# Patient Record
Sex: Female | Born: 1965 | Race: White | Hispanic: No | Smoking: Never smoker
Health system: Southern US, Community
[De-identification: ages and names within clinical notes are randomized; demographics above are authoritative.]

## PROBLEM LIST (undated history)

## (undated) DIAGNOSIS — F419 Anxiety disorder, unspecified: Secondary | ICD-10-CM

## (undated) DIAGNOSIS — F329 Major depressive disorder, single episode, unspecified: Secondary | ICD-10-CM

## (undated) DIAGNOSIS — Z803 Family history of malignant neoplasm of breast: Secondary | ICD-10-CM

## (undated) DIAGNOSIS — K8020 Calculus of gallbladder without cholecystitis without obstruction: Secondary | ICD-10-CM

## (undated) DIAGNOSIS — F32A Depression, unspecified: Secondary | ICD-10-CM

## (undated) DIAGNOSIS — M1990 Unspecified osteoarthritis, unspecified site: Secondary | ICD-10-CM

## (undated) DIAGNOSIS — R1319 Other dysphagia: Secondary | ICD-10-CM

## (undated) DIAGNOSIS — G4730 Sleep apnea, unspecified: Secondary | ICD-10-CM

## (undated) DIAGNOSIS — R079 Chest pain, unspecified: Secondary | ICD-10-CM

## (undated) DIAGNOSIS — K5790 Diverticulosis of intestine, part unspecified, without perforation or abscess without bleeding: Secondary | ICD-10-CM

## (undated) DIAGNOSIS — Z90711 Acquired absence of uterus with remaining cervical stump: Secondary | ICD-10-CM

## (undated) DIAGNOSIS — E039 Hypothyroidism, unspecified: Secondary | ICD-10-CM

## (undated) DIAGNOSIS — N898 Other specified noninflammatory disorders of vagina: Secondary | ICD-10-CM

## (undated) DIAGNOSIS — R0609 Other forms of dyspnea: Secondary | ICD-10-CM

## (undated) DIAGNOSIS — Z8541 Personal history of malignant neoplasm of cervix uteri: Secondary | ICD-10-CM

## (undated) DIAGNOSIS — I10 Essential (primary) hypertension: Secondary | ICD-10-CM

## (undated) DIAGNOSIS — K200 Eosinophilic esophagitis: Secondary | ICD-10-CM

## (undated) DIAGNOSIS — K219 Gastro-esophageal reflux disease without esophagitis: Secondary | ICD-10-CM

## (undated) DIAGNOSIS — G3184 Mild cognitive impairment, so stated: Secondary | ICD-10-CM

## (undated) DIAGNOSIS — C801 Malignant (primary) neoplasm, unspecified: Secondary | ICD-10-CM

## (undated) DIAGNOSIS — R1310 Dysphagia, unspecified: Secondary | ICD-10-CM

## (undated) DIAGNOSIS — R002 Palpitations: Secondary | ICD-10-CM

## (undated) HISTORY — DX: Chest pain, unspecified: R07.9

## (undated) HISTORY — DX: Palpitations: R00.2

## (undated) HISTORY — DX: Personal history of malignant neoplasm of cervix uteri: Z85.41

## (undated) HISTORY — DX: Family history of malignant neoplasm of breast: Z80.3

## (undated) HISTORY — DX: Other forms of dyspnea: R06.09

## (undated) HISTORY — DX: Other dysphagia: R13.19

## (undated) HISTORY — PX: ABDOMINAL HYSTERECTOMY: SHX81

## (undated) HISTORY — PX: VAGINAL HYSTERECTOMY: SUR661

## (undated) HISTORY — PX: TUBAL LIGATION: SHX77

## (undated) HISTORY — DX: Eosinophilic esophagitis: K20.0

---
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
## 2005-02-07 ENCOUNTER — Emergency Department: Payer: Self-pay | Admitting: Emergency Medicine

## 2005-02-07 ENCOUNTER — Other Ambulatory Visit: Payer: Self-pay

## 2005-04-19 ENCOUNTER — Emergency Department: Payer: Self-pay | Admitting: Emergency Medicine

## 2005-04-22 ENCOUNTER — Ambulatory Visit: Payer: Self-pay | Admitting: Obstetrics and Gynecology

## 2005-06-07 ENCOUNTER — Emergency Department: Payer: Self-pay | Admitting: Emergency Medicine

## 2005-06-09 ENCOUNTER — Emergency Department: Payer: Self-pay | Admitting: Emergency Medicine

## 2005-06-10 ENCOUNTER — Emergency Department: Payer: Self-pay | Admitting: Emergency Medicine

## 2005-09-22 ENCOUNTER — Emergency Department: Payer: Self-pay | Admitting: Emergency Medicine

## 2005-11-19 ENCOUNTER — Emergency Department: Payer: Self-pay | Admitting: Emergency Medicine

## 2006-03-08 ENCOUNTER — Emergency Department: Payer: Self-pay | Admitting: Emergency Medicine

## 2006-04-05 ENCOUNTER — Emergency Department: Payer: Self-pay

## 2006-07-30 ENCOUNTER — Ambulatory Visit: Payer: Self-pay | Admitting: Obstetrics and Gynecology

## 2006-09-28 ENCOUNTER — Emergency Department: Payer: Self-pay

## 2006-11-04 ENCOUNTER — Emergency Department: Payer: Self-pay

## 2007-01-03 ENCOUNTER — Other Ambulatory Visit: Payer: Self-pay

## 2007-01-03 ENCOUNTER — Emergency Department: Payer: Self-pay | Admitting: Emergency Medicine

## 2007-01-19 ENCOUNTER — Emergency Department: Payer: Self-pay | Admitting: Emergency Medicine

## 2007-06-30 ENCOUNTER — Emergency Department: Payer: Self-pay | Admitting: Emergency Medicine

## 2007-08-03 ENCOUNTER — Ambulatory Visit: Payer: Self-pay | Admitting: Specialist

## 2007-08-08 ENCOUNTER — Emergency Department: Payer: Self-pay | Admitting: Emergency Medicine

## 2007-11-09 ENCOUNTER — Emergency Department: Payer: Self-pay | Admitting: Emergency Medicine

## 2008-07-26 ENCOUNTER — Emergency Department: Payer: Self-pay | Admitting: Emergency Medicine

## 2008-10-01 ENCOUNTER — Emergency Department: Payer: Self-pay

## 2009-01-01 ENCOUNTER — Emergency Department: Payer: Self-pay | Admitting: Emergency Medicine

## 2009-01-03 ENCOUNTER — Ambulatory Visit: Payer: Self-pay | Admitting: Emergency Medicine

## 2009-01-17 ENCOUNTER — Emergency Department: Payer: Self-pay | Admitting: Internal Medicine

## 2009-02-26 ENCOUNTER — Ambulatory Visit: Payer: Self-pay | Admitting: Surgery

## 2009-04-18 ENCOUNTER — Emergency Department: Payer: Self-pay | Admitting: Emergency Medicine

## 2009-07-08 ENCOUNTER — Ambulatory Visit: Payer: Self-pay | Admitting: Family Medicine

## 2009-09-19 ENCOUNTER — Emergency Department: Payer: Self-pay | Admitting: Emergency Medicine

## 2009-12-21 ENCOUNTER — Ambulatory Visit: Payer: Self-pay | Admitting: Internal Medicine

## 2010-03-27 ENCOUNTER — Emergency Department: Payer: Self-pay | Admitting: Emergency Medicine

## 2010-08-03 ENCOUNTER — Emergency Department: Payer: Self-pay | Admitting: Emergency Medicine

## 2010-08-06 ENCOUNTER — Ambulatory Visit: Payer: Self-pay | Admitting: Family Medicine

## 2010-10-13 ENCOUNTER — Ambulatory Visit: Payer: Self-pay | Admitting: Family Medicine

## 2010-10-16 ENCOUNTER — Emergency Department: Payer: Self-pay | Admitting: Emergency Medicine

## 2010-10-17 ENCOUNTER — Emergency Department: Payer: Self-pay | Admitting: Emergency Medicine

## 2010-11-20 ENCOUNTER — Encounter: Payer: Self-pay | Admitting: Family Medicine

## 2010-12-14 ENCOUNTER — Encounter: Payer: Self-pay | Admitting: Family Medicine

## 2011-04-07 ENCOUNTER — Emergency Department: Payer: Self-pay | Admitting: Unknown Physician Specialty

## 2011-04-21 ENCOUNTER — Ambulatory Visit: Payer: Self-pay | Admitting: Urology

## 2011-05-26 ENCOUNTER — Encounter: Payer: Self-pay | Admitting: Podiatry

## 2011-06-16 ENCOUNTER — Encounter: Payer: Self-pay | Admitting: Podiatry

## 2011-06-30 ENCOUNTER — Ambulatory Visit: Payer: Self-pay | Admitting: Podiatry

## 2011-06-30 ENCOUNTER — Ambulatory Visit: Payer: Self-pay | Admitting: Family Medicine

## 2011-08-13 ENCOUNTER — Emergency Department: Payer: Self-pay | Admitting: Emergency Medicine

## 2011-10-26 ENCOUNTER — Emergency Department: Payer: Self-pay | Admitting: Emergency Medicine

## 2011-10-26 LAB — URINALYSIS, COMPLETE
Bilirubin,UR: NEGATIVE
Glucose,UR: NEGATIVE mg/dL (ref 0–75)
Ketone: NEGATIVE
Nitrite: NEGATIVE
Protein: NEGATIVE
RBC,UR: 1 /HPF (ref 0–5)
Specific Gravity: 1.017 (ref 1.003–1.030)
WBC UR: 1 /HPF (ref 0–5)

## 2011-12-04 ENCOUNTER — Ambulatory Visit: Payer: Self-pay | Admitting: Family Medicine

## 2012-02-02 ENCOUNTER — Ambulatory Visit: Payer: Self-pay | Admitting: Family Medicine

## 2012-02-24 ENCOUNTER — Emergency Department: Payer: Self-pay | Admitting: Emergency Medicine

## 2012-02-24 LAB — URINALYSIS, COMPLETE
Bilirubin,UR: NEGATIVE
Blood: NEGATIVE
Glucose,UR: NEGATIVE mg/dL (ref 0–75)
Ketone: NEGATIVE
Nitrite: NEGATIVE
Protein: NEGATIVE
RBC,UR: <1 /HPF (ref 0–5)
WBC UR: 7 /HPF (ref 0–5)

## 2012-02-24 LAB — CBC
HGB: 12.3 g/dL (ref 12.0–16.0)
Platelet: 192 10*3/uL (ref 150–440)
RDW: 13.5 % (ref 11.5–14.5)

## 2012-03-03 DIAGNOSIS — G5750 Tarsal tunnel syndrome, unspecified lower limb: Secondary | ICD-10-CM | POA: Insufficient documentation

## 2012-04-13 ENCOUNTER — Emergency Department: Payer: Self-pay | Admitting: Emergency Medicine

## 2012-04-13 LAB — PROTIME-INR
INR: 0.9
Prothrombin Time: 12.4 secs (ref 11.5–14.7)

## 2012-04-13 LAB — BASIC METABOLIC PANEL
BUN: 11 mg/dL (ref 7–18)
Calcium, Total: 8.9 mg/dL (ref 8.5–10.1)
Co2: 29 mmol/L (ref 21–32)
Creatinine: 0.81 mg/dL (ref 0.60–1.30)
EGFR (Non-African Amer.): >60
Glucose: 88 mg/dL (ref 65–99)
Osmolality: 273 (ref 275–301)
Potassium: 4.1 mmol/L (ref 3.5–5.1)
Sodium: 137 mmol/L (ref 136–145)

## 2012-04-13 LAB — CBC
HCT: 37.6 % (ref 35.0–47.0)
MCH: 29.9 pg (ref 26.0–34.0)
MCV: 85 fL (ref 80–100)
Platelet: 205 10*3/uL (ref 150–440)
RDW: 14 % (ref 11.5–14.5)
WBC: 6.5 10*3/uL (ref 3.6–11.0)

## 2012-04-13 LAB — APTT: Activated PTT: 25.2 secs (ref 23.6–35.9)

## 2012-05-03 ENCOUNTER — Emergency Department: Payer: Self-pay | Admitting: Emergency Medicine

## 2012-05-03 LAB — URINALYSIS, COMPLETE
Bilirubin,UR: NEGATIVE
Blood: NEGATIVE
Glucose,UR: NEGATIVE mg/dL (ref 0–75)
Ketone: NEGATIVE
Protein: NEGATIVE
RBC,UR: <1 /HPF (ref 0–5)
Specific Gravity: 1.008 (ref 1.003–1.030)
Squamous Epithelial: <1
WBC UR: 1 /HPF (ref 0–5)

## 2012-05-05 DIAGNOSIS — G8929 Other chronic pain: Secondary | ICD-10-CM | POA: Insufficient documentation

## 2012-05-05 LAB — URINE CULTURE

## 2012-05-13 DIAGNOSIS — D3000 Benign neoplasm of unspecified kidney: Secondary | ICD-10-CM | POA: Insufficient documentation

## 2012-05-13 DIAGNOSIS — R350 Frequency of micturition: Secondary | ICD-10-CM | POA: Insufficient documentation

## 2012-05-13 DIAGNOSIS — R399 Unspecified symptoms and signs involving the genitourinary system: Secondary | ICD-10-CM | POA: Insufficient documentation

## 2012-05-13 DIAGNOSIS — N3941 Urge incontinence: Secondary | ICD-10-CM | POA: Insufficient documentation

## 2012-05-30 DIAGNOSIS — M659 Synovitis and tenosynovitis, unspecified: Secondary | ICD-10-CM | POA: Insufficient documentation

## 2012-06-13 ENCOUNTER — Emergency Department: Payer: Self-pay | Admitting: Emergency Medicine

## 2012-06-13 LAB — CBC
HGB: 12.7 g/dL (ref 12.0–16.0)
MCHC: 34.5 g/dL (ref 32.0–36.0)
RBC: 4.38 10*6/uL (ref 3.80–5.20)
WBC: 6.5 10*3/uL (ref 3.6–11.0)

## 2012-06-13 LAB — COMPREHENSIVE METABOLIC PANEL
Alkaline Phosphatase: 90 U/L (ref 50–136)
BUN: 12 mg/dL (ref 7–18)
Calcium, Total: 9 mg/dL (ref 8.5–10.1)
Chloride: 99 mmol/L (ref 98–107)
Co2: 31 mmol/L (ref 21–32)
Creatinine: 0.89 mg/dL (ref 0.60–1.30)
EGFR (Non-African Amer.): >60
Glucose: 124 mg/dL — ABNORMAL HIGH (ref 65–99)
SGOT(AST): 22 U/L (ref 15–37)
SGPT (ALT): 33 U/L (ref 12–78)
Sodium: 138 mmol/L (ref 136–145)

## 2012-06-13 LAB — URINALYSIS, COMPLETE
Bilirubin,UR: NEGATIVE
Blood: NEGATIVE
Glucose,UR: NEGATIVE mg/dL (ref 0–75)
Ketone: NEGATIVE
Nitrite: NEGATIVE
Specific Gravity: 1.002 (ref 1.003–1.030)
Squamous Epithelial: 8

## 2012-06-13 LAB — LIPASE, BLOOD: Lipase: 167 U/L (ref 73–393)

## 2012-08-31 ENCOUNTER — Ambulatory Visit: Payer: Self-pay | Admitting: Family Medicine

## 2012-09-21 ENCOUNTER — Ambulatory Visit: Payer: Self-pay | Admitting: Specialist

## 2012-09-21 LAB — POTASSIUM: Potassium: 3.3 mmol/L — ABNORMAL LOW (ref 3.5–5.1)

## 2012-09-28 ENCOUNTER — Ambulatory Visit: Payer: Self-pay | Admitting: Specialist

## 2012-11-02 ENCOUNTER — Ambulatory Visit: Payer: Self-pay | Admitting: Obstetrics and Gynecology

## 2012-11-30 ENCOUNTER — Ambulatory Visit: Payer: Self-pay | Admitting: Obstetrics and Gynecology

## 2012-12-02 ENCOUNTER — Ambulatory Visit: Payer: Self-pay | Admitting: Family Medicine

## 2012-12-11 ENCOUNTER — Emergency Department: Payer: Self-pay | Admitting: Emergency Medicine

## 2012-12-12 DIAGNOSIS — R339 Retention of urine, unspecified: Secondary | ICD-10-CM | POA: Insufficient documentation

## 2012-12-20 ENCOUNTER — Emergency Department: Payer: Self-pay | Admitting: Emergency Medicine

## 2012-12-20 LAB — CBC
HCT: 39 % (ref 35.0–47.0)
HGB: 13.2 g/dL (ref 12.0–16.0)
MCH: 28.5 pg (ref 26.0–34.0)
MCHC: 34 g/dL (ref 32.0–36.0)
MCV: 84 fL (ref 80–100)
Platelet: 232 10*3/uL (ref 150–440)
RDW: 13.9 % (ref 11.5–14.5)
WBC: 7.1 10*3/uL (ref 3.6–11.0)

## 2012-12-20 LAB — TROPONIN I: Troponin-I: <0.02 ng/mL

## 2012-12-20 LAB — CK TOTAL AND CKMB (NOT AT ARMC)
CK, Total: 60 U/L (ref 21–215)
CK-MB: <0.5 ng/mL — ABNORMAL LOW (ref 0.5–3.6)

## 2012-12-20 LAB — COMPREHENSIVE METABOLIC PANEL
Albumin: 3.8 g/dL (ref 3.4–5.0)
BUN: 13 mg/dL (ref 7–18)
Calcium, Total: 8.9 mg/dL (ref 8.5–10.1)
Chloride: 105 mmol/L (ref 98–107)
Co2: 27 mmol/L (ref 21–32)
EGFR (African American): >60
Potassium: 3.8 mmol/L (ref 3.5–5.1)
SGOT(AST): 26 U/L (ref 15–37)
SGPT (ALT): 30 U/L (ref 12–78)

## 2012-12-22 ENCOUNTER — Emergency Department: Payer: Self-pay | Admitting: Emergency Medicine

## 2012-12-22 LAB — COMPREHENSIVE METABOLIC PANEL
Albumin: 3.6 g/dL (ref 3.4–5.0)
Alkaline Phosphatase: 84 U/L (ref 50–136)
Bilirubin,Total: 0.2 mg/dL (ref 0.2–1.0)
Calcium, Total: 9.2 mg/dL (ref 8.5–10.1)
Co2: 29 mmol/L (ref 21–32)
Creatinine: 0.86 mg/dL (ref 0.60–1.30)
Osmolality: 274 (ref 275–301)
Potassium: 4.1 mmol/L (ref 3.5–5.1)
SGOT(AST): 24 U/L (ref 15–37)
SGPT (ALT): 29 U/L (ref 12–78)
Sodium: 137 mmol/L (ref 136–145)

## 2012-12-22 LAB — URINALYSIS, COMPLETE
Bacteria: NONE SEEN
Bilirubin,UR: NEGATIVE
Ketone: NEGATIVE
Nitrite: NEGATIVE
Protein: NEGATIVE
RBC,UR: NONE SEEN /HPF (ref 0–5)
Squamous Epithelial: 13

## 2012-12-22 LAB — CK TOTAL AND CKMB (NOT AT ARMC)
CK, Total: 65 U/L (ref 21–215)
CK-MB: <0.5 ng/mL — ABNORMAL LOW (ref 0.5–3.6)

## 2012-12-22 LAB — CBC
HCT: 37.7 % (ref 35.0–47.0)
HGB: 12.8 g/dL (ref 12.0–16.0)
MCH: 28.4 pg (ref 26.0–34.0)
MCHC: 34 g/dL (ref 32.0–36.0)
MCV: 84 fL (ref 80–100)
Platelet: 232 10*3/uL (ref 150–440)
RDW: 14.3 % (ref 11.5–14.5)
WBC: 7.6 10*3/uL (ref 3.6–11.0)

## 2012-12-22 LAB — PRO B NATRIURETIC PEPTIDE: B-Type Natriuretic Peptide: 209 pg/mL — ABNORMAL HIGH (ref 0–125)

## 2013-04-03 ENCOUNTER — Encounter: Payer: Self-pay | Admitting: Specialist

## 2013-04-14 ENCOUNTER — Encounter: Payer: Self-pay | Admitting: Specialist

## 2013-05-10 ENCOUNTER — Ambulatory Visit: Payer: Self-pay | Admitting: Family Medicine

## 2013-05-15 ENCOUNTER — Encounter: Payer: Self-pay | Admitting: Specialist

## 2013-10-04 ENCOUNTER — Ambulatory Visit: Payer: Self-pay | Admitting: Specialist

## 2014-01-18 ENCOUNTER — Emergency Department: Payer: Self-pay | Admitting: Emergency Medicine

## 2014-01-18 LAB — COMPREHENSIVE METABOLIC PANEL
ANION GAP: 3 — AB (ref 7–16)
AST: 18 U/L (ref 15–37)
Albumin: 3.8 g/dL (ref 3.4–5.0)
Alkaline Phosphatase: 72 U/L
BILIRUBIN TOTAL: 0.3 mg/dL (ref 0.2–1.0)
BUN: 8 mg/dL (ref 7–18)
Calcium, Total: 9.1 mg/dL (ref 8.5–10.1)
Chloride: 105 mmol/L (ref 98–107)
Co2: 30 mmol/L (ref 21–32)
Creatinine: 0.78 mg/dL (ref 0.60–1.30)
EGFR (African American): >60
EGFR (Non-African Amer.): >60
Glucose: 93 mg/dL (ref 65–99)
OSMOLALITY: 274 (ref 275–301)
Potassium: 3.8 mmol/L (ref 3.5–5.1)
SGPT (ALT): 23 U/L (ref 12–78)
Sodium: 138 mmol/L (ref 136–145)
Total Protein: 7.6 g/dL (ref 6.4–8.2)

## 2014-01-18 LAB — CBC WITH DIFFERENTIAL/PLATELET
BASOS PCT: 0.9 %
Basophil #: 0.1 10*3/uL (ref 0.0–0.1)
EOS PCT: 7.4 %
Eosinophil #: 0.5 10*3/uL (ref 0.0–0.7)
HCT: 39.5 % (ref 35.0–47.0)
HGB: 13.5 g/dL (ref 12.0–16.0)
LYMPHS PCT: 37.5 %
Lymphocyte #: 2.5 10*3/uL (ref 1.0–3.6)
MCH: 29.2 pg (ref 26.0–34.0)
MCHC: 34.2 g/dL (ref 32.0–36.0)
MCV: 86 fL (ref 80–100)
MONOS PCT: 5 %
Monocyte #: 0.3 x10 3/mm (ref 0.2–0.9)
NEUTROS ABS: 3.3 10*3/uL (ref 1.4–6.5)
NEUTROS PCT: 49.2 %
Platelet: 211 10*3/uL (ref 150–440)
RBC: 4.62 10*6/uL (ref 3.80–5.20)
RDW: 13.4 % (ref 11.5–14.5)
WBC: 6.7 10*3/uL (ref 3.6–11.0)

## 2014-01-18 LAB — LIPASE, BLOOD: Lipase: 136 U/L (ref 73–393)

## 2014-01-18 LAB — TROPONIN I: Troponin-I: <0.02 ng/mL

## 2014-02-02 DIAGNOSIS — F419 Anxiety disorder, unspecified: Secondary | ICD-10-CM

## 2014-02-02 DIAGNOSIS — F331 Major depressive disorder, recurrent, moderate: Secondary | ICD-10-CM | POA: Insufficient documentation

## 2014-02-02 DIAGNOSIS — F329 Major depressive disorder, single episode, unspecified: Secondary | ICD-10-CM | POA: Insufficient documentation

## 2014-02-02 DIAGNOSIS — N319 Neuromuscular dysfunction of bladder, unspecified: Secondary | ICD-10-CM | POA: Insufficient documentation

## 2014-02-02 DIAGNOSIS — G4733 Obstructive sleep apnea (adult) (pediatric): Secondary | ICD-10-CM | POA: Insufficient documentation

## 2014-02-02 DIAGNOSIS — F32A Depression, unspecified: Secondary | ICD-10-CM | POA: Insufficient documentation

## 2014-02-02 DIAGNOSIS — E039 Hypothyroidism, unspecified: Secondary | ICD-10-CM | POA: Insufficient documentation

## 2014-02-15 ENCOUNTER — Ambulatory Visit: Payer: Self-pay | Admitting: Gastroenterology

## 2014-02-16 LAB — PATHOLOGY REPORT

## 2014-02-22 ENCOUNTER — Emergency Department: Payer: Self-pay | Admitting: Emergency Medicine

## 2014-02-22 LAB — COMPREHENSIVE METABOLIC PANEL
ALBUMIN: 3.5 g/dL (ref 3.4–5.0)
ALK PHOS: 69 U/L
AST: 21 U/L (ref 15–37)
Anion Gap: 6 — ABNORMAL LOW (ref 7–16)
BILIRUBIN TOTAL: 0.3 mg/dL (ref 0.2–1.0)
BUN: 6 mg/dL — AB (ref 7–18)
CO2: 28 mmol/L (ref 21–32)
Calcium, Total: 8.8 mg/dL (ref 8.5–10.1)
Chloride: 104 mmol/L (ref 98–107)
Creatinine: 0.75 mg/dL (ref 0.60–1.30)
EGFR (Non-African Amer.): >60
Glucose: 96 mg/dL (ref 65–99)
Osmolality: 273 (ref 275–301)
Potassium: 3.8 mmol/L (ref 3.5–5.1)
SGPT (ALT): 20 U/L (ref 12–78)
SODIUM: 138 mmol/L (ref 136–145)
TOTAL PROTEIN: 7 g/dL (ref 6.4–8.2)

## 2014-02-22 LAB — CBC
HCT: 37.7 % (ref 35.0–47.0)
HGB: 12.6 g/dL (ref 12.0–16.0)
MCH: 28.7 pg (ref 26.0–34.0)
MCHC: 33.5 g/dL (ref 32.0–36.0)
MCV: 86 fL (ref 80–100)
Platelet: 225 10*3/uL (ref 150–440)
RBC: 4.4 10*6/uL (ref 3.80–5.20)
RDW: 13.3 % (ref 11.5–14.5)
WBC: 5.7 10*3/uL (ref 3.6–11.0)

## 2014-02-22 LAB — LIPASE, BLOOD: LIPASE: 143 U/L (ref 73–393)

## 2014-05-07 ENCOUNTER — Ambulatory Visit: Payer: Self-pay | Admitting: Family Medicine

## 2014-07-16 ENCOUNTER — Encounter: Payer: Self-pay | Admitting: Internal Medicine

## 2014-07-23 DIAGNOSIS — M25561 Pain in right knee: Secondary | ICD-10-CM | POA: Insufficient documentation

## 2014-07-23 DIAGNOSIS — G8929 Other chronic pain: Secondary | ICD-10-CM | POA: Insufficient documentation

## 2014-07-31 ENCOUNTER — Encounter: Payer: Self-pay | Admitting: Internal Medicine

## 2014-08-29 ENCOUNTER — Emergency Department: Payer: Self-pay | Admitting: Student

## 2014-08-29 ENCOUNTER — Emergency Department: Payer: Self-pay | Admitting: Emergency Medicine

## 2014-08-29 LAB — URINALYSIS, COMPLETE
BILIRUBIN, UR: NEGATIVE
BLOOD: NEGATIVE
Bacteria: NONE SEEN
Glucose,UR: NEGATIVE mg/dL (ref 0–75)
Ketone: NEGATIVE
LEUKOCYTE ESTERASE: NEGATIVE
NITRITE: NEGATIVE
Ph: 5 (ref 4.5–8.0)
Protein: NEGATIVE
RBC,UR: <1 /HPF (ref 0–5)
Specific Gravity: 1.019 (ref 1.003–1.030)
Squamous Epithelial: 1
WBC UR: 1 /HPF (ref 0–5)

## 2014-08-29 LAB — COMPREHENSIVE METABOLIC PANEL
ALK PHOS: 82 U/L
ALT: 26 U/L
AST: 25 U/L (ref 15–37)
Albumin: 3.5 g/dL (ref 3.4–5.0)
Anion Gap: 3 — ABNORMAL LOW (ref 7–16)
BUN: 9 mg/dL (ref 7–18)
Bilirubin,Total: 0.4 mg/dL (ref 0.2–1.0)
CALCIUM: 9 mg/dL (ref 8.5–10.1)
Chloride: 103 mmol/L (ref 98–107)
Co2: 30 mmol/L (ref 21–32)
Creatinine: 0.93 mg/dL (ref 0.60–1.30)
EGFR (Non-African Amer.): >60
GLUCOSE: 88 mg/dL (ref 65–99)
Osmolality: 270 (ref 275–301)
Potassium: 3.8 mmol/L (ref 3.5–5.1)
Sodium: 136 mmol/L (ref 136–145)
Total Protein: 7.8 g/dL (ref 6.4–8.2)

## 2014-08-29 LAB — CBC
HCT: 40.1 % (ref 35.0–47.0)
HGB: 13.2 g/dL (ref 12.0–16.0)
MCH: 28.7 pg (ref 26.0–34.0)
MCHC: 32.9 g/dL (ref 32.0–36.0)
MCV: 87 fL (ref 80–100)
Platelet: 249 10*3/uL (ref 150–440)
RBC: 4.6 10*6/uL (ref 3.80–5.20)
RDW: 13.5 % (ref 11.5–14.5)
WBC: 6.3 10*3/uL (ref 3.6–11.0)

## 2014-11-14 ENCOUNTER — Ambulatory Visit: Payer: Self-pay | Admitting: Family Medicine

## 2014-11-29 ENCOUNTER — Ambulatory Visit: Payer: Self-pay | Admitting: Family Medicine

## 2014-12-20 ENCOUNTER — Emergency Department
Admit: 2014-12-20 | Disposition: A | Discharge status: Home / Self Care | Payer: Self-pay | Admitting: Emergency Medicine

## 2014-12-25 ENCOUNTER — Ambulatory Visit
Admit: 2014-12-25 | Disposition: A | Discharge status: Home / Self Care | Payer: Self-pay | Attending: Family Medicine | Admitting: Family Medicine

## 2014-12-27 ENCOUNTER — Emergency Department
Admit: 2014-12-27 | Disposition: A | Discharge status: Home / Self Care | Payer: Self-pay | Admitting: Emergency Medicine

## 2014-12-27 LAB — CBC
HCT: 42.1 % (ref 35.0–47.0)
HGB: 14.1 g/dL (ref 12.0–16.0)
MCH: 28.2 pg (ref 26.0–34.0)
MCHC: 33.5 g/dL (ref 32.0–36.0)
MCV: 84 fL (ref 80–100)
Platelet: 176 10*3/uL (ref 150–440)
RBC: 5.01 10*6/uL (ref 3.80–5.20)
RDW: 14 % (ref 11.5–14.5)
WBC: 5.9 10*3/uL (ref 3.6–11.0)

## 2014-12-27 LAB — COMPREHENSIVE METABOLIC PANEL
ALBUMIN: 4.3 g/dL
ALT: 22 U/L
AST: 20 U/L
Alkaline Phosphatase: 65 U/L
Anion Gap: 3 — ABNORMAL LOW (ref 7–16)
BUN: 11 mg/dL
Bilirubin,Total: 0.4 mg/dL
CALCIUM: 8.8 mg/dL — AB
Chloride: 103 mmol/L
Co2: 29 mmol/L
Creatinine: 0.82 mg/dL
EGFR (African American): >60
EGFR (Non-African Amer.): >60
Glucose: 90 mg/dL
POTASSIUM: 4.3 mmol/L
SODIUM: 135 mmol/L
Total Protein: 7.6 g/dL

## 2014-12-27 LAB — LIPASE, BLOOD: Lipase: 30 U/L

## 2015-01-04 NOTE — Op Note (Signed)
PATIENT NAME:  Kristina Reed, Kristina Reed MR#:  [id number] DATE OF BIRTH:  11-03-1965  DATE OF PROCEDURE:  09/28/2012  PREOPERATIVE DIAGNOSIS:  1.  Tear of the posterior horn of the left medial meniscus.  2.  Extensive synovitis left knee.   POSTOPERATIVE DIAGNOSIS:  1.  Tear of the posterior horn of the left medial meniscus.  2.  Extensive synovitis left knee.   PROCEDURES: 1.  Arthroscopic partial left medial meniscectomy.  2.  Arthroscopic partial synovectomy of the knee.  SURGEON: Park Breed, M.D.   ANESTHESIA: General endotracheal.   COMPLICATIONS: None.   DRAINS: None.   OPERATIVE FINDINGS: The patient had degeneration of the inner border of the medial meniscus posteriorly. This was friable and wavy in nature.  It did not stand up to probing at all. The anterior and posterior cruciates were intact. The lateral compartment was normal. There was extensive synovitis. This was prominent anteriorly and the suprapatellar region. There were no loose bodies and the articular surfaces were normal.   DESCRIPTION OF PROCEDURE: The patient was brought to the Operating Room where she underwent where she underwent satisfactory general endotracheal anesthesia in the supine position. The left knee was prepped and draped in sterile fashion and arthroscopy carried out through standard portals. The above findings were encountered on arthroscopy.  A good deal of time was spent on the anterior synovectomy medially and laterally since this was quite proliferative.  The suprapatellar region was left intact. The medial and lateral gutters were normal. The medial compartment was opened and probed extensively. The posterior meniscus was quite friable and then basket forceps were used to remove the degenerated, friable inner aspect of the meniscus. A shaver was used to smooth this off posteriorly. This brought the meniscus back to its healthy stable tissue.  Once this was completed and adequate  synovectomy was carried  out, the pump pressure was lowered and bleeders were cauterized. The joint was thoroughly irrigated and the stab wounds then closed with 3-0 nylon suture.  0.5% Marcaine with epinephrine and morphine was placed in the joint. Dry sterile dressing was applied. The tourniquet was not used. The patient was awakened and taken to recovery in good condition.    ____________________________ Park Breed, MD hem:ct D: 09/28/2012 08:57:43 ET T: 09/28/2012 10:49:02 ET JOB#: [id number]  cc: Park Breed, MD, <Dictator> Park Breed MD ELECTRONICALLY SIGNED 09/29/2012 13:09

## 2015-01-20 ENCOUNTER — Emergency Department
Admission: EM | Admit: 2015-01-20 | Discharge: 2015-01-20 | Disposition: A | Discharge status: Home / Self Care | Payer: Medicaid Other | Attending: Emergency Medicine | Admitting: Emergency Medicine

## 2015-01-20 ENCOUNTER — Encounter: Payer: Self-pay | Admitting: Emergency Medicine

## 2015-01-20 DIAGNOSIS — N390 Urinary tract infection, site not specified: Secondary | ICD-10-CM | POA: Diagnosis not present

## 2015-01-20 DIAGNOSIS — Z90710 Acquired absence of both cervix and uterus: Secondary | ICD-10-CM | POA: Insufficient documentation

## 2015-01-20 DIAGNOSIS — R112 Nausea with vomiting, unspecified: Secondary | ICD-10-CM | POA: Diagnosis present

## 2015-01-20 HISTORY — DX: Other specified noninflammatory disorders of vagina: N89.8

## 2015-01-20 HISTORY — DX: Malignant (primary) neoplasm, unspecified: C80.1

## 2015-01-20 HISTORY — DX: Calculus of gallbladder without cholecystitis without obstruction: K80.20

## 2015-01-20 HISTORY — DX: Unspecified osteoarthritis, unspecified site: M19.90

## 2015-01-20 LAB — CBC WITH DIFFERENTIAL/PLATELET
Basophils Absolute: 0 10*3/uL (ref 0–0.1)
Basophils Relative: 1 %
Eosinophils Absolute: 0.2 10*3/uL (ref 0–0.7)
Eosinophils Relative: 4 %
HCT: 39.7 % (ref 35.0–47.0)
Hemoglobin: 13.3 g/dL (ref 12.0–16.0)
Lymphocytes Relative: 29 %
Lymphs Abs: 1.9 10*3/uL (ref 1.0–3.6)
MCH: 28.4 pg (ref 26.0–34.0)
MCHC: 33.4 g/dL (ref 32.0–36.0)
MCV: 84.9 fL (ref 80.0–100.0)
MONO ABS: 0.4 10*3/uL (ref 0.2–0.9)
Monocytes Relative: 6 %
NEUTROS PCT: 60 %
Neutro Abs: 3.8 10*3/uL (ref 1.4–6.5)
Platelets: 197 10*3/uL (ref 150–440)
RBC: 4.68 MIL/uL (ref 3.80–5.20)
RDW: 13.7 % (ref 11.5–14.5)
WBC: 6.4 10*3/uL (ref 3.6–11.0)

## 2015-01-20 LAB — URINALYSIS COMPLETE WITH MICROSCOPIC (ARMC ONLY)
Bilirubin Urine: NEGATIVE
Glucose, UA: NEGATIVE mg/dL
HGB URINE DIPSTICK: NEGATIVE
KETONES UR: NEGATIVE mg/dL
NITRITE: NEGATIVE
PROTEIN: 30 mg/dL — AB
SPECIFIC GRAVITY, URINE: 1.023 (ref 1.005–1.030)
pH: 5 (ref 5.0–8.0)

## 2015-01-20 LAB — BASIC METABOLIC PANEL
Anion gap: 7 (ref 5–15)
BUN: 10 mg/dL (ref 6–20)
CALCIUM: 9.4 mg/dL (ref 8.9–10.3)
CO2: 27 mmol/L (ref 22–32)
CREATININE: 0.91 mg/dL (ref 0.44–1.00)
Chloride: 105 mmol/L (ref 101–111)
GFR calc Af Amer: >60 mL/min (ref >60)
Glucose, Bld: 105 mg/dL — ABNORMAL HIGH (ref 65–99)
Potassium: 3.9 mmol/L (ref 3.5–5.1)
SODIUM: 139 mmol/L (ref 135–145)

## 2015-01-20 MED ORDER — NITROFURANTOIN MONOHYD MACRO 100 MG PO CAPS: 100.0000 mg | ORAL_CAPSULE | Freq: Two times a day (BID) | ORAL | Status: AC

## 2015-01-20 MED ORDER — NITROFURANTOIN MACROCRYSTAL 100 MG PO CAPS
ORAL_CAPSULE | ORAL | Status: AC
  Filled 2015-01-20: qty 1

## 2015-01-20 MED ORDER — METOCLOPRAMIDE HCL 10 MG PO TABS: 10.0000 mg | ORAL_TABLET | Freq: Four times a day (QID) | ORAL | Status: DC | PRN

## 2015-01-20 MED ORDER — NITROFURANTOIN MONOHYD MACRO 100 MG PO CAPS
100.0000 mg | ORAL_CAPSULE | Freq: Once | ORAL | Status: AC
  Administered 2015-01-20: 100 mg via ORAL
  Filled 2015-01-20: qty 1

## 2015-01-20 NOTE — ED Provider Notes (Signed)
[facility] Emergency Department Provider Note  ____________________________________________  Time seen: Approximately 1:20 PM  I have reviewed the triage vital signs and the nursing notes.   HISTORY  Chief Complaint Nausea and Emesis    HPI Kristina Reed is a 49 y.o. female with a recent history of nausea and vomiting presents with nausea vomiting for the past 5 days. The patient says that she vomits whenever she eats despite taking Zofran at home. She is not claiming any pain at this time. She says there is no blood in the vomit just appears as the food that she has eaten. She is passing gas. There is no dysuria. She is currently seen at [facility] and is being referred to a GI doctor which she has not seen yet.   Past Medical History  Diagnosis Date  . Thyroid disease   . Arthritis     right knee and right elbow  . Cancer   . Gallstones   . Vaginal inclusion cyst     There are no active problems to display for this patient.   Past Surgical History  Procedure Laterality Date  . Abdominal hysterectomy      partial hysterectomy    No current outpatient prescriptions on file.  Allergies Percocet  Family History  Problem Relation Age of Onset  . Diabetes Mother     Social History History  Substance Use Topics  . Smoking status: Never Smoker   . Smokeless tobacco: Not on file  . Alcohol Use: No    Review of Systems Constitutional: No fever/chills Eyes: No visual changes. ENT: No sore throat. Cardiovascular: Denies chest pain. Respiratory: Denies shortness of breath. Gastrointestinal: No abdominal pain.  No diarrhea.  No constipation. Genitourinary: Negative for dysuria. Musculoskeletal: Negative for back pain. Skin: Negative for rash. Neurological: Negative for headaches, focal weakness or numbness.  10-point ROS otherwise negative.  ____________________________________________   PHYSICAL EXAM:  VITAL  SIGNS: ED Triage Vitals  Enc Vitals Group     BP 01/20/15 1104 130/79 mmHg     Pulse Rate 01/20/15 1104 68     Resp 01/20/15 1104 18     Temp 01/20/15 1104 98.1 F (36.7 C)     Temp Source 01/20/15 1104 Oral     SpO2 01/20/15 1104 99 %     Weight 01/20/15 1104 166 lb (75.297 kg)     Height 01/20/15 1104 5\' 1"  (1.549 m)     Head Cir --      Peak Flow --      Pain Score --      Pain Loc --      Pain Edu? --      Excl. in [place]? --     Constitutional: Alert and oriented. Well appearing and in no acute distress. Eyes: Conjunctivae are normal. PERRL. EOMI. Head: Atraumatic. Nose: No congestion/rhinnorhea. Mouth/Throat: Mucous membranes are moist.  Oropharynx non-erythematous. Neck: No stridor.   Cardiovascular: Normal rate, regular rhythm. Grossly normal heart sounds.  Good peripheral circulation. Respiratory: Normal respiratory effort.  No retractions. Lungs CTAB. Gastrointestinal: Soft and nontender. No distention. No abdominal bruits. No CVA tenderness. Musculoskeletal: No lower extremity tenderness nor edema.  No joint effusions. Neurologic:  Normal speech and language. No gross focal neurologic deficits are appreciated. Speech is normal. No gait instability. Skin:  Skin is warm, dry and intact. No rash noted. Psychiatric: Mood and affect are normal. Speech and behavior are normal.  ____________________________________________   LABS (all labs  ordered are listed, but only abnormal results are displayed)  Labs Reviewed  BASIC METABOLIC PANEL - Abnormal; Notable for the following:    Glucose, Bld 105 (*)    All other components within normal limits  URINALYSIS COMPLETEWITH MICROSCOPIC ([facility])  - Abnormal; Notable for the following:    Color, Urine YELLOW (*)    APPearance CLOUDY (*)    Protein, ur 30 (*)    Leukocytes, UA 3+ (*)    Bacteria, UA RARE (*)    Squamous Epithelial / LPF 6-30 (*)    All other components within normal limits  URINE CULTURE  CBC WITH  DIFFERENTIAL/PLATELET   ____________________________________________  EKG   ____________________________________________  RADIOLOGY   ____________________________________________   PROCEDURES  Procedure(s) performed:   Critical Care performed: No  ____________________________________________   INITIAL IMPRESSION / ASSESSMENT AND PLAN / ED COURSE  Pertinent labs & imaging results that were available during my care of the patient were reviewed by me and considered in my medical decision making (see chart for details).  Patient with UTI but otherwise reassuring lab work and exam. I will give the patient antibiotics for her UTI and we'll try her with Reglan since her Zofran is not working. I offered to the patient that we could try the Reglan and a by mouth challenge before she leaves the emergency department today. However the patient would rather try the new prescription home and follow up with her primary care doctor at [facility]. Plan in place to follow up with GI. ____________________________________________   FINAL CLINICAL IMPRESSION(S) / ED DIAGNOSES  Acute nausea and vomiting. Initial visit.    Doran Stabler, MD 01/20/15 1323

## 2015-01-20 NOTE — ED Notes (Signed)
Pt c/o N/V for the past 5 days.Marland Kitchendenies diarrhea or abd pain

## 2015-01-20 NOTE — ED Notes (Signed)
Pt informed to return if life threatening symptoms occur.   

## 2015-01-22 ENCOUNTER — Emergency Department
Admission: EM | Admit: 2015-01-22 | Discharge: 2015-01-22 | Disposition: A | Discharge status: Home / Self Care | Payer: Medicaid Other | Attending: Emergency Medicine | Admitting: Emergency Medicine

## 2015-01-22 ENCOUNTER — Encounter: Payer: Self-pay | Admitting: Emergency Medicine

## 2015-01-22 DIAGNOSIS — Z79899 Other long term (current) drug therapy: Secondary | ICD-10-CM | POA: Insufficient documentation

## 2015-01-22 DIAGNOSIS — R112 Nausea with vomiting, unspecified: Secondary | ICD-10-CM | POA: Insufficient documentation

## 2015-01-22 LAB — URINALYSIS COMPLETE WITH MICROSCOPIC (ARMC ONLY)
Bilirubin Urine: NEGATIVE
Glucose, UA: NEGATIVE mg/dL
Hgb urine dipstick: NEGATIVE
Nitrite: NEGATIVE
PROTEIN: 30 mg/dL — AB
SPECIFIC GRAVITY, URINE: 1.027 (ref 1.005–1.030)
pH: 5 (ref 5.0–8.0)

## 2015-01-22 LAB — COMPREHENSIVE METABOLIC PANEL
ALT: 30 U/L (ref 14–54)
ANION GAP: 6 (ref 5–15)
AST: 21 U/L (ref 15–41)
Albumin: 4.4 g/dL (ref 3.5–5.0)
Alkaline Phosphatase: 64 U/L (ref 38–126)
BILIRUBIN TOTAL: 0.4 mg/dL (ref 0.3–1.2)
BUN: 11 mg/dL (ref 6–20)
CO2: 29 mmol/L (ref 22–32)
Calcium: 9.3 mg/dL (ref 8.9–10.3)
Chloride: 104 mmol/L (ref 101–111)
Creatinine, Ser: 0.88 mg/dL (ref 0.44–1.00)
GFR calc Af Amer: >60 mL/min (ref >60)
GFR calc non Af Amer: >60 mL/min (ref >60)
GLUCOSE: 87 mg/dL (ref 65–99)
POTASSIUM: 3.9 mmol/L (ref 3.5–5.1)
SODIUM: 139 mmol/L (ref 135–145)
Total Protein: 7.9 g/dL (ref 6.5–8.1)

## 2015-01-22 LAB — CBC WITH DIFFERENTIAL/PLATELET
Basophils Absolute: 0 10*3/uL (ref 0–0.1)
Basophils Relative: 0 %
EOS ABS: 0.2 10*3/uL (ref 0–0.7)
Eosinophils Relative: 4 %
HCT: 41.3 % (ref 35.0–47.0)
HEMOGLOBIN: 13.8 g/dL (ref 12.0–16.0)
LYMPHS ABS: 2.3 10*3/uL (ref 1.0–3.6)
Lymphocytes Relative: 36 %
MCH: 28.2 pg (ref 26.0–34.0)
MCHC: 33.4 g/dL (ref 32.0–36.0)
MCV: 84.6 fL (ref 80.0–100.0)
MONOS PCT: 6 %
Monocytes Absolute: 0.4 10*3/uL (ref 0.2–0.9)
NEUTROS ABS: 3.6 10*3/uL (ref 1.4–6.5)
Neutrophils Relative %: 54 %
Platelets: 189 10*3/uL (ref 150–440)
RBC: 4.88 MIL/uL (ref 3.80–5.20)
RDW: 13.6 % (ref 11.5–14.5)
WBC: 6.5 10*3/uL (ref 3.6–11.0)

## 2015-01-22 MED ORDER — GI COCKTAIL ~~LOC~~
30.0000 mL | Freq: Once | ORAL | Status: AC
  Administered 2015-01-22: 30 mL via ORAL

## 2015-01-22 MED ORDER — LORAZEPAM 0.5 MG PO TABS: 0.5000 mg | ORAL_TABLET | Freq: Three times a day (TID) | ORAL | Status: DC | PRN

## 2015-01-22 MED ORDER — NITROFURANTOIN MONOHYD MACRO 100 MG PO CAPS: 100.0000 mg | ORAL_CAPSULE | Freq: Two times a day (BID) | ORAL | Status: AC

## 2015-01-22 MED ORDER — ONDANSETRON 4 MG PO TBDP
4.0000 mg | ORAL_TABLET | Freq: Once | ORAL | Status: AC
  Administered 2015-01-22: 4 mg via ORAL

## 2015-01-22 MED ORDER — GI COCKTAIL ~~LOC~~
ORAL | Status: AC
  Administered 2015-01-22: 30 mL via ORAL
  Filled 2015-01-22: qty 30

## 2015-01-22 MED ORDER — LORAZEPAM 0.5 MG PO TABS
ORAL_TABLET | ORAL | Status: AC
  Filled 2015-01-22: qty 1

## 2015-01-22 MED ORDER — LORAZEPAM 0.5 MG PO TABS
0.5000 mg | ORAL_TABLET | Freq: Once | ORAL | Status: AC
  Administered 2015-01-22: 0.5 mg via ORAL

## 2015-01-22 MED ORDER — ONDANSETRON 4 MG PO TBDP
ORAL_TABLET | ORAL | Status: AC
  Administered 2015-01-22: 4 mg via ORAL
  Filled 2015-01-22: qty 1

## 2015-01-22 NOTE — ED Notes (Signed)
Pt reports that she was here 5 days ago for the same symptoms, did not go to office but called her PMD and told him that he is not any better, so they instructed her to come here to be re-evaluated.

## 2015-01-22 NOTE — Discharge Instructions (Signed)
Take the ativan as prescribed. Do not drink alcohol, drive or participate in any other potentially dangerous activities while taking this medication as it may make you sleepy. Do not take this medication with any other sedating medications, either prescription or over-the-counter.   Nausea and Vomiting Nausea is a sick feeling that often comes before throwing up (vomiting). Vomiting is a reflex where stomach contents come out of your mouth. Vomiting can cause severe loss of body fluids (dehydration). Children and elderly adults can become dehydrated quickly, especially if they also have diarrhea. Nausea and vomiting are symptoms of a condition or disease. It is important to find the cause of your symptoms. CAUSES   Direct irritation of the stomach lining. This irritation can result from increased acid production (gastroesophageal reflux disease), infection, food poisoning, taking certain medicines (such as nonsteroidal anti-inflammatory drugs), alcohol use, or tobacco use.  Signals from the brain.These signals could be caused by a headache, heat exposure, an inner ear disturbance, increased pressure in the brain from injury, infection, a tumor, or a concussion, pain, emotional stimulus, or metabolic problems.  An obstruction in the gastrointestinal tract (bowel obstruction).  Illnesses such as diabetes, hepatitis, gallbladder problems, appendicitis, kidney problems, cancer, sepsis, atypical symptoms of a heart attack, or eating disorders.  Medical treatments such as chemotherapy and radiation.  Receiving medicine that makes you sleep (general anesthetic) during surgery. DIAGNOSIS Your caregiver may ask for tests to be done if the problems do not improve after a few days. Tests may also be done if symptoms are severe or if the reason for the nausea and vomiting is not clear. Tests may include:  Urine tests.  Blood tests.  Stool tests.  Cultures (to look for evidence of  infection).  X-rays or other imaging studies. Test results can help your caregiver make decisions about treatment or the need for additional tests. TREATMENT You need to stay well hydrated. Drink frequently but in small amounts.You may wish to drink water, sports drinks, clear broth, or eat frozen ice pops or gelatin dessert to help stay hydrated.When you eat, eating slowly may help prevent nausea.There are also some antinausea medicines that may help prevent nausea. HOME CARE INSTRUCTIONS   Take all medicine as directed by your caregiver.  If you do not have an appetite, do not force yourself to eat. However, you must continue to drink fluids.  If you have an appetite, eat a normal diet unless your caregiver tells you differently.  Eat a variety of complex carbohydrates (rice, wheat, potatoes, bread), lean meats, yogurt, fruits, and vegetables.  Avoid high-fat foods because they are more difficult to digest.  Drink enough water and fluids to keep your urine clear or pale yellow.  If you are dehydrated, ask your caregiver for specific rehydration instructions. Signs of dehydration may include:  Severe thirst.  Dry lips and mouth.  Dizziness.  Dark urine.  Decreasing urine frequency and amount.  Confusion.  Rapid breathing or pulse. SEEK IMMEDIATE MEDICAL CARE IF:   You have blood or brown flecks (like coffee grounds) in your vomit.  You have black or bloody stools.  You have a severe headache or stiff neck.  You are confused.  You have severe abdominal pain.  You have chest pain or trouble breathing.  You do not urinate at least once every 8 hours.  You develop cold or clammy skin.  You continue to vomit for longer than 24 to 48 hours.  You have a fever. MAKE SURE YOU:  Understand these instructions.  Will watch your condition.  Will get help right away if you are not doing well or get worse. Document Released: 08/31/2005 Document Revised: 11/23/2011  Document Reviewed: 01/28/2011 [facility] Patient Information 2015 [place], [place]. This information is not intended to replace advice given to you by your health care provider. Make sure you discuss any questions you have with your health care provider.

## 2015-01-22 NOTE — ED Provider Notes (Signed)
[facility] Emergency Department Provider Note    ____________________________________________  Time seen: 1515  I have reviewed the triage vital signs and the nursing notes.   HISTORY  Chief Complaint Emesis   History limited by: Not Limited   HPI Kristina Reed is a 49 y.o. female who presents to the emergency department today because of continued nausea and vomiting whilst eating. The patient states the symptoms have begun on for at least a week. She was seen in the emergency department couple of days for further same symptoms. Diagnosed with a UTI and given Phenergan suppositories and Macrobid. She states suppositories haven't worked and she has not been able to keep her antibiotics down. She denies ever having any associated abdominal pain. Denies any fevers. Denies any chest pain or shortness breath.     Past Medical History  Diagnosis Date  . Thyroid disease   . Arthritis     right knee and right elbow  . Cancer   . Gallstones   . Vaginal inclusion cyst     There are no active problems to display for this patient.   Past Surgical History  Procedure Laterality Date  . Abdominal hysterectomy      partial hysterectomy    Current Outpatient Rx  Name  Route  Sig  Dispense  Refill  . metoCLOPramide (REGLAN) 10 MG tablet   Oral   Take 1 tablet (10 mg total) by mouth every 6 (six) hours as needed for nausea or vomiting.   12 tablet   1   . nitrofurantoin, macrocrystal-monohydrate, (MACROBID) 100 MG capsule   Oral   Take 1 capsule (100 mg total) by mouth 2 (two) times daily.   14 capsule   0     Allergies Percocet  Family History  Problem Relation Age of Onset  . Diabetes Mother     Social History History  Substance Use Topics  . Smoking status: Never Smoker   . Smokeless tobacco: Not on file  . Alcohol Use: No    Review of Systems  Constitutional: Negative for fever. Cardiovascular: Negative for chest  pain. Respiratory: Negative for shortness of breath. Gastrointestinal: Negative for abdominal pain, positive for nausea and vomiting Genitourinary: Culture with urination Musculoskeletal: Negative for back pain. Skin: Negative for rash. Neurological: Negative for headaches, focal weakness or numbness.   10-point ROS otherwise negative.  ____________________________________________   PHYSICAL EXAM:  VITAL SIGNS: ED Triage Vitals  Enc Vitals Group     BP 01/22/15 1119 122/85 mmHg     Pulse Rate 01/22/15 1119 69     Resp 01/22/15 1119 18     Temp 01/22/15 1119 97.5 F (36.4 C)     Temp Source 01/22/15 1119 Oral     SpO2 01/22/15 1119 100 %     Weight 01/22/15 1119 166 lb (75.297 kg)     Height 01/22/15 1119 5\' 1"  (1.549 m)   Constitutional: Alert and oriented. Well appearing and in no distress. Eyes: Conjunctivae are normal. PERRL. Normal extraocular movements. ENT   Head: Normocephalic and atraumatic.   Nose: No congestion/rhinnorhea.   Mouth/Throat: Mucous membranes are moist.   Neck: No stridor. Hematological/Lymphatic/Immunilogical: No cervical lymphadenopathy. Cardiovascular: Normal rate, regular rhythm.  No murmurs, rubs, or gallops. Respiratory: Normal respiratory effort without tachypnea nor retractions. Breath sounds are clear and equal bilaterally. No wheezes/rales/rhonchi. Gastrointestinal: Soft and nontender. No distention. There is no CVA tenderness. Genitourinary: Deferred Musculoskeletal: Normal range of motion in all extremities. No joint  effusions.  No lower extremity tenderness nor edema. Neurologic:  Normal speech and language. No gross focal neurologic deficits are appreciated. Speech is normal.  Skin:  Skin is warm, dry and intact. No rash noted. Psychiatric: Mood and affect are normal. Speech and behavior are normal. Patient exhibits appropriate insight and judgment.  ____________________________________________    LABS (pertinent  positives/negatives)  Labs Reviewed  CBC WITH DIFFERENTIAL/PLATELET  COMPREHENSIVE METABOLIC PANEL     ____________________________________________   EKG  None  ____________________________________________    RADIOLOGY  None  ____________________________________________   PROCEDURES  Procedure(s) performed: None  Critical Care performed: No  ____________________________________________   INITIAL IMPRESSION / ASSESSMENT AND PLAN / ED COURSE  Pertinent labs & imaging results that were available during my care of the patient were reviewed by me and considered in my medical decision making (see chart for details).  Patient here with continued nausea and vomiting. On exam patient does not have any abdominal tenderness. Lab work is reassuring. We will try GI cocktail.  ----------------------------------------- 6:43 PM on 01/22/2015 -----------------------------------------  Patient is much improved after half a milligram of Ativan. Patient able to eat half a [place] sandwich without problems. Plan to discharge patient home with course of Ativan. Did encourage patient to continue to follow-up as scheduled.  Unfortunately urine has been delayed. Family needs to leave the emergency department to travel safely home. Will give patient more prescription for Macrobid given the patient has had unclear ability to keep pills down in the past couple of days.  In my judgment, in view of the above findings, the patient has a reassuring evaluation and can be safely discharged.Issues concerning treatment and diagnosis were discussed. There were no barriers to understanding. The plan of treatment explanation was well received by the patient and/or family who then verbalized understanding.  ____________________________________________   FINAL CLINICAL IMPRESSION(S) / ED DIAGNOSES  Final diagnoses:  Nausea and vomiting, vomiting of unspecified type     Nance Pear,  MD 01/22/15 1844

## 2015-01-22 NOTE — ED Notes (Signed)
Patient given [place] sandwich tray and eating well.  No obvious distress, no nausea.  Will continue to monitor.

## 2015-02-01 ENCOUNTER — Other Ambulatory Visit: Payer: Self-pay | Admitting: Gastroenterology

## 2015-02-01 DIAGNOSIS — R112 Nausea with vomiting, unspecified: Secondary | ICD-10-CM

## 2015-02-07 ENCOUNTER — Ambulatory Visit
Admission: RE | Admit: 2015-02-07 | Discharge: 2015-02-07 | Disposition: A | Discharge status: Home / Self Care | Payer: Medicaid Other | Source: Ambulatory Visit | Attending: Gastroenterology | Admitting: Gastroenterology

## 2015-02-07 DIAGNOSIS — M4807 Spinal stenosis, lumbosacral region: Secondary | ICD-10-CM | POA: Insufficient documentation

## 2015-02-07 DIAGNOSIS — M4806 Spinal stenosis, lumbar region: Secondary | ICD-10-CM | POA: Diagnosis not present

## 2015-02-07 DIAGNOSIS — R112 Nausea with vomiting, unspecified: Secondary | ICD-10-CM | POA: Diagnosis present

## 2015-02-07 DIAGNOSIS — D1771 Benign lipomatous neoplasm of kidney: Secondary | ICD-10-CM | POA: Diagnosis not present

## 2015-02-07 HISTORY — DX: Acquired absence of uterus with remaining cervical stump: Z90.711

## 2015-02-07 MED ORDER — IOHEXOL 350 MG/ML SOLN
100.0000 mL | Freq: Once | INTRAVENOUS | Status: AC | PRN
  Administered 2015-02-07: 100 mL via INTRAVENOUS

## 2015-02-13 ENCOUNTER — Ambulatory Visit
Admission: RE | Admit: 2015-02-13 | Discharge: 2015-02-13 | Disposition: A | Discharge status: Home / Self Care | Payer: Medicaid Other | Source: Ambulatory Visit | Attending: Gastroenterology | Admitting: Gastroenterology

## 2015-02-13 DIAGNOSIS — R112 Nausea with vomiting, unspecified: Secondary | ICD-10-CM | POA: Insufficient documentation

## 2015-02-13 MED ORDER — TECHNETIUM TC 99M SULFUR COLLOID
2.0000 | Freq: Once | INTRAVENOUS | Status: AC | PRN
  Administered 2015-02-13: 2.1 via INTRAVENOUS

## 2015-02-17 ENCOUNTER — Encounter: Payer: Self-pay | Admitting: Emergency Medicine

## 2015-02-17 ENCOUNTER — Emergency Department
Admission: EM | Admit: 2015-02-17 | Discharge: 2015-02-17 | Disposition: A | Discharge status: Home / Self Care | Payer: Medicaid Other | Attending: Emergency Medicine | Admitting: Emergency Medicine

## 2015-02-17 DIAGNOSIS — Z8719 Personal history of other diseases of the digestive system: Secondary | ICD-10-CM | POA: Diagnosis not present

## 2015-02-17 DIAGNOSIS — R112 Nausea with vomiting, unspecified: Secondary | ICD-10-CM | POA: Diagnosis not present

## 2015-02-17 HISTORY — DX: Gastro-esophageal reflux disease without esophagitis: K21.9

## 2015-02-17 LAB — COMPREHENSIVE METABOLIC PANEL
ALBUMIN: 4.1 g/dL (ref 3.5–5.0)
ALT: 16 U/L (ref 14–54)
AST: 21 U/L (ref 15–41)
Alkaline Phosphatase: 60 U/L (ref 38–126)
Anion gap: 7 (ref 5–15)
BUN: 10 mg/dL (ref 6–20)
CHLORIDE: 105 mmol/L (ref 101–111)
CO2: 25 mmol/L (ref 22–32)
CREATININE: 1 mg/dL (ref 0.44–1.00)
Calcium: 9.3 mg/dL (ref 8.9–10.3)
GFR calc Af Amer: >60 mL/min (ref >60)
Glucose, Bld: 100 mg/dL — ABNORMAL HIGH (ref 65–99)
Potassium: 3.7 mmol/L (ref 3.5–5.1)
Sodium: 137 mmol/L (ref 135–145)
TOTAL PROTEIN: 7.6 g/dL (ref 6.5–8.1)
Total Bilirubin: 0.3 mg/dL (ref 0.3–1.2)

## 2015-02-17 LAB — CBC WITH DIFFERENTIAL/PLATELET
BASOS ABS: 0 10*3/uL (ref 0–0.1)
Basophils Relative: 1 %
Eosinophils Absolute: 0.2 10*3/uL (ref 0–0.7)
Eosinophils Relative: 3 %
HEMATOCRIT: 39 % (ref 35.0–47.0)
Hemoglobin: 13.3 g/dL (ref 12.0–16.0)
Lymphocytes Relative: 37 %
Lymphs Abs: 2.5 10*3/uL (ref 1.0–3.6)
MCH: 28.4 pg (ref 26.0–34.0)
MCHC: 34.1 g/dL (ref 32.0–36.0)
MCV: 83.3 fL (ref 80.0–100.0)
MONO ABS: 0.4 10*3/uL (ref 0.2–0.9)
MONOS PCT: 7 %
Neutro Abs: 3.5 10*3/uL (ref 1.4–6.5)
Neutrophils Relative %: 52 %
Platelets: 177 10*3/uL (ref 150–440)
RBC: 4.68 MIL/uL (ref 3.80–5.20)
RDW: 13.7 % (ref 11.5–14.5)
WBC: 6.7 10*3/uL (ref 3.6–11.0)

## 2015-02-17 LAB — LIPASE, BLOOD: Lipase: 30 U/L (ref 22–51)

## 2015-02-17 LAB — TROPONIN I: Troponin I: <0.03 ng/mL (ref <0.031)

## 2015-02-17 MED ORDER — METOCLOPRAMIDE HCL 5 MG/ML IJ SOLN
20.0000 mg | Freq: Once | INTRAVENOUS | Status: AC
  Administered 2015-02-17: 20 mg via INTRAVENOUS
  Filled 2015-02-17: qty 4

## 2015-02-17 MED ORDER — SODIUM CHLORIDE 0.9 % IV BOLUS (SEPSIS)
1000.0000 mL | Freq: Once | INTRAVENOUS | Status: AC
  Administered 2015-02-17: 1000 mL via INTRAVENOUS

## 2015-02-17 MED ORDER — METOCLOPRAMIDE HCL 10 MG PO TABS: 10.0000 mg | ORAL_TABLET | Freq: Three times a day (TID) | ORAL | Status: DC

## 2015-02-17 NOTE — Discharge Instructions (Signed)

## 2015-02-17 NOTE — ED Notes (Signed)
AAOx3.  Skin warm and dry.  Ambulates well and independently.  Understnading of discharge instructions verbalized.

## 2015-02-17 NOTE — ED Notes (Signed)
Pt with vomiting for three weeks. Pt with hx of GERD. Denies any abd pain.

## 2015-02-17 NOTE — ED Notes (Signed)
AAOx3.  Skin warm and dry.  Denies c/o pain.  Tolerating PO well.  Denies nausea.

## 2015-02-17 NOTE — ED Notes (Signed)
Pt given crackers and water for PO challenge

## 2015-02-17 NOTE — ED Notes (Signed)
Pt dx with "retention" by PCP per family. Pt unable to keep solid foods down due to them sitting in stomach and not being digested. Denies abdominal pain. Denies nausea. Denies diarrhea.

## 2015-02-17 NOTE — ED Provider Notes (Signed)
[facility] Emergency Department Provider Note  Time seen: 10:28 AM  I have reviewed the triage vital signs and the nursing notes.   HISTORY  Chief Complaint Emesis    HPI Kristina Reed is a 49 y.o. female with a past medical history of gastric reflux who presents to the emergency department with nausea and vomiting. According to the patient for the past 3 months she has had intermittent nausea and vomiting episodes. She has been seen in the emergency department several times as well as with Dr. Lamonte Richer (GI medicine).She is currently working with her GI doctor to diagnose her condition, and hopefully treatment options. Patient states yesterday she was doing well and was able to eat most everything she tried. This morning since awakening she has been nauseated and vomiting. Denies any abdominal pain, diarrhea, dysuria, bloody or black stool or vomit, chest pain, shortness breath. Patient is frustrated that she has not received a diagnosis, and the Phenergan she was prescribed is not helping.    Past Medical History  Diagnosis Date  . Thyroid disease   . Arthritis     right knee and right elbow  . Gallstones   . Vaginal inclusion cyst   . Cancer     Cervical CA with partial hysterectomy.  . Status post partial hysterectomy     Due to Cervical CA  . GERD (gastroesophageal reflux disease)     There are no active problems to display for this patient.   Past Surgical History  Procedure Laterality Date  . Abdominal hysterectomy      partial hysterectomy    Current Outpatient Rx  Name  Route  Sig  Dispense  Refill  . LORazepam (ATIVAN) 0.5 MG tablet   Oral   Take 1 tablet (0.5 mg total) by mouth every 8 (eight) hours as needed for anxiety (Nausea).   15 tablet   0   . metoCLOPramide (REGLAN) 10 MG tablet   Oral   Take 1 tablet (10 mg total) by mouth every 6 (six) hours as needed for nausea or vomiting.   12 tablet   1      Allergies Percocet  Family History  Problem Relation Age of Onset  . Diabetes Mother     Social History History  Substance Use Topics  . Smoking status: Never Smoker   . Smokeless tobacco: Not on file  . Alcohol Use: No    Review of Systems Constitutional: Negative for fever. Cardiovascular: Negative for chest pain. Respiratory: Negative for shortness of breath. Gastrointestinal: Negative for abdominal pain. Positive for nausea/vomiting. Negative for diarrhea. Genitourinary: Negative for dysuria. Musculoskeletal: Negative for back pain. 10-point ROS otherwise negative.  ____________________________________________   PHYSICAL EXAM:  VITAL SIGNS: ED Triage Vitals  Enc Vitals Group     BP 02/17/15 0958 120/76 mmHg     Pulse Rate 02/17/15 0958 68     Resp 02/17/15 0958 20     Temp 02/17/15 0958 98.6 F (37 C)     Temp Source 02/17/15 0958 Oral     SpO2 02/17/15 0958 99 %     Weight 02/17/15 0958 160 lb (72.576 kg)     Height 02/17/15 0958 5\' 1"  (1.549 m)     Head Cir --      Peak Flow --      Pain Score 02/17/15 0959 0     Pain Loc --      Pain Edu? --      Excl. in  [place]? --     Constitutional: Alert and oriented. Well appearing and in no distress. ENT   Mouth/Throat: Mucous membranes are moist. Cardiovascular: Normal rate, regular rhythm. No murmurs Respiratory: Normal respiratory effort without tachypnea nor retractions. Breath sounds are clear  Gastrointestinal: Soft and nontender. No distention.   Musculoskeletal: Nontender with normal range of motion in all extremities.  Neurologic:  Normal speech and language. No gross focal neurologic deficits Skin:  Skin is warm, dry and intact.  Psychiatric: Mood and affect are normal. Speech and behavior are normal.   ____________________________________________   INITIAL IMPRESSION / ASSESSMENT AND PLAN / ED COURSE  Pertinent labs & imaging results that were available during my care of the patient were  reviewed by me and considered in my medical decision making (see chart for details).  Patient with 3 months of intermittent nausea. She states her GI doctor told her that her stomach is not emptying quick enough. Denies any abdominal pain. Has a benign abdominal exam. We will check labs, treat symptoms, and closely monitor in the emergency department.   Labs are within normal limits. Patient states she feels considerably better. She has tolerated by mouth without issue. We will discharge home on Reglan and have her follow up with her GI doctor. ____________________________________________   FINAL CLINICAL IMPRESSION(S) / ED DIAGNOSES  Nausea/vomiting   Harvest Dark, MD 02/17/15 [phone]

## 2015-02-18 ENCOUNTER — Telehealth: Payer: Self-pay | Admitting: Family Medicine

## 2015-02-18 DIAGNOSIS — R112 Nausea with vomiting, unspecified: Secondary | ICD-10-CM | POA: Insufficient documentation

## 2015-02-18 NOTE — Telephone Encounter (Signed)
Requesting a referral for patient to be seen at [facility] with Kristina Reed for gastroparesis Kristina Reed have done all they can do. [phone] (F) [phone] (P)

## 2015-02-18 NOTE — Telephone Encounter (Signed)
Referral placed per patient request

## 2015-03-06 ENCOUNTER — Ambulatory Visit: Payer: Medicaid Other | Admitting: Dietician

## 2015-04-05 ENCOUNTER — Encounter: Payer: Self-pay | Admitting: Family Medicine

## 2015-04-22 ENCOUNTER — Encounter: Payer: Self-pay | Admitting: Family Medicine

## 2015-04-22 ENCOUNTER — Telehealth: Payer: Self-pay | Admitting: Family Medicine

## 2015-04-22 ENCOUNTER — Ambulatory Visit (INDEPENDENT_AMBULATORY_CARE_PROVIDER_SITE_OTHER): Payer: Medicaid Other | Admitting: Family Medicine

## 2015-04-22 VITALS — BP 100/68 | HR 80 | Temp 97.8°F | Resp 16 | Ht 62.0 in | Wt 162.5 lb

## 2015-04-22 DIAGNOSIS — R110 Nausea: Secondary | ICD-10-CM | POA: Diagnosis not present

## 2015-04-22 DIAGNOSIS — F418 Other specified anxiety disorders: Secondary | ICD-10-CM

## 2015-04-22 DIAGNOSIS — F419 Anxiety disorder, unspecified: Secondary | ICD-10-CM

## 2015-04-22 DIAGNOSIS — R112 Nausea with vomiting, unspecified: Secondary | ICD-10-CM

## 2015-04-22 DIAGNOSIS — F329 Major depressive disorder, single episode, unspecified: Secondary | ICD-10-CM

## 2015-04-22 MED ORDER — FLUOXETINE HCL 20 MG PO CAPS: 20.0000 mg | ORAL_CAPSULE | Freq: Every day | ORAL | Status: DC

## 2015-04-22 MED ORDER — LORAZEPAM 0.5 MG PO TABS: 0.5000 mg | ORAL_TABLET | Freq: Two times a day (BID) | ORAL | Status: DC | PRN

## 2015-04-22 MED ORDER — METOCLOPRAMIDE HCL 10 MG PO TABS
10.0000 mg | ORAL_TABLET | Freq: Four times a day (QID) | ORAL | Status: DC | PRN
Start: 2015-04-22 — End: 2016-03-10

## 2015-04-22 NOTE — Progress Notes (Signed)
Name: Kristina Reed   MRN: [id number]    DOB: 1966-02-23   Date:04/22/2015       Progress Note  Subjective  Chief Complaint  Chief Complaint  Patient presents with  . Follow-up  . Medication Refill    HPI  Anxiety: Patient complains of anxiety disorder, sleep disturbance and occasional depressed moods.  She has the following symptoms: difficulty concentrating, fatigue, insomnia. Onset of symptoms was approximately several years ago, stable since that time. She denies current suicidal and homicidal ideation. Family history significant for alcoholism, anxiety and depression.Possible organic causes contributing are: medications, endocrine/metabolic. Risk factors: positive family history in  parents and previous episode of depression Previous treatment includes Ativan and medication.  She complains of the following side effects from the treatment: none.  Her significant other is going through depression and has attempted suicide which has been very stressful for her. He is seeking help.  Nausea: This is a ongoing chronic problem for many years. Most nausea and vomiting occurs with meals or drinks but not every time she eats. Symptoms are not associated dysphagia, choking, abdominal pain, weight loss, palpitations, headaches, changes in bowel movements or urine. 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 She finds symptomatic relief with benzodiazepine use and nausea medication use such as Reglan.    Patient Active Problem List   Diagnosis Date Noted  . Nausea and vomiting in adult patient 02/18/2015  . Bursitis of knee 07/23/2014  . Anxiety and depression 02/02/2014  . Bladder dysfunction 02/02/2014  . Acid reflux 02/02/2014  . Adult hypothyroidism 02/02/2014  . Obstructive apnea 02/02/2014  . Incomplete bladder emptying 12/12/2012  . Tenosynovitis of foot 05/30/2012  . Benign neoplasm of  kidney 05/13/2012  . Symptoms involving urinary system 05/13/2012  . Urge incontinence 05/13/2012  . FOM (frequency of micturition) 05/13/2012  . Extremity pain 05/05/2012  . Tarsal tunnel syndrome 03/03/2012    History  Substance Use Topics  . Smoking status: Never Smoker   . Smokeless tobacco: Not on file  . Alcohol Use: No     Current outpatient prescriptions:  .  docusate sodium (STOOL SOFTENER) 100 MG capsule, Take 100 mg by mouth., Disp: , Rfl:  .  omeprazole (PRILOSEC) 40 MG capsule, Take by mouth., Disp: , Rfl:  .  promethazine (PHENERGAN) 25 MG suppository, Place 25 mg rectally every 6 (six) hours as needed for nausea or vomiting., Disp: , Rfl:  .  traMADol (ULTRAM) 50 MG tablet, Take by mouth every 6 (six) hours as needed., Disp: , Rfl:  .  FLUoxetine (PROZAC) 20 MG capsule, Take by mouth., Disp: , Rfl:  .  levothyroxine (SYNTHROID, LEVOTHROID) 50 MCG tablet, Take by mouth., Disp: , Rfl:  .  LORazepam (ATIVAN) 0.5 MG tablet, Take 1 tablet (0.5 mg total) by mouth every 8 (eight) hours as needed for anxiety (Nausea)., Disp: 15 tablet, Rfl: 0 .  meloxicam (MOBIC) 15 MG tablet, Take 15 mg by mouth., Disp: , Rfl:  .  metoCLOPramide (REGLAN) 10 MG tablet, Take 1 tablet (10 mg total) by mouth 3 (three) times daily with meals., Disp: 90 tablet, Rfl: 0 .  ondansetron (ZOFRAN) 4 MG tablet, Take by mouth., Disp: , Rfl:  .  sucralfate (CARAFATE) 1 GM/10ML suspension, Take by mouth., Disp: , Rfl:  .  traZODone (DESYREL) 50 MG tablet, Take 50 mg by mouth., Disp: , Rfl:   Past  Surgical History  Procedure Laterality Date  . Abdominal hysterectomy      partial hysterectomy    Family History  Problem Relation Age of Onset  . Diabetes Mother     Allergies  Allergen Reactions  . Percocet [Oxycodone-Acetaminophen] Palpitations     Review of Systems  CONSTITUTIONAL: No significant weight changes, fever, chills, weakness or fatigue.  HEENT:  - Eyes: No visual changes.  - Ears:  No auditory changes. No pain.  - Nose: No sneezing, congestion, runny nose. - Throat: No sore throat. No changes in swallowing. SKIN: No rash or itching.  CARDIOVASCULAR: No chest pain, chest pressure or chest discomfort. No palpitations or edema.  RESPIRATORY: No shortness of breath, cough or sputum.  GASTROINTESTINAL: No anorexia, nausea, vomiting. No changes in bowel habits. No abdominal pain or blood.  GENITOURINARY: No dysuria. No frequency. No discharge. NEUROLOGICAL: No headache, dizziness, syncope, paralysis, ataxia, numbness or tingling in the extremities. No memory changes. No change in bowel or bladder control.  MUSCULOSKELETAL: No joint pain. No muscle pain. HEMATOLOGIC: No anemia, bleeding or bruising.  LYMPHATICS: No enlarged lymph nodes.  PSYCHIATRIC: No change in mood. No change in sleep pattern.  ENDOCRINOLOGIC: No reports of sweating, cold or heat intolerance. No polyuria or polydipsia.     Objective  BP 100/68 mmHg  Pulse 80  Temp(Src) 97.8 F (36.6 C) (Oral)  Resp 16  Ht '5\' 2"'  (1.575 m)  Wt 162 lb 8 oz (73.71 kg)  BMI 29.71 kg/m2  SpO2 97%  LMP 05/09/1993 (Approximate) Body mass index is 29.71 kg/(m^2).  Physical Exam  Constitutional: Patient appears well-developed and well-nourished. In no distress.  HEENT:  - Head: Normocephalic and atraumatic.  - Ears: Bilateral TMs gray, no erythema or effusion - Nose: Nasal mucosa moist - Mouth/Throat: Oropharynx is clear and moist. No tonsillar hypertrophy or erythema. No post nasal drainage.  - Eyes: Conjunctivae clear, EOM movements normal. PERRLA. No scleral icterus. Wearing corrective glasses. Slight strabismus.  Neck: Normal range of motion. Neck supple. No JVD present. No thyromegaly present.  Cardiovascular: Normal rate, regular rhythm and normal heart sounds.  No murmur heard.  Pulmonary/Chest: Effort normal and breath sounds normal. No respiratory distress. Abdomen: Soft, NT/ND, normal bowel sounds in all  four quadrants. Musculoskeletal: Normal range of motion bilateral UE and LE, no joint effusions. Peripheral vascular: Bilateral LE no edema. Neurological: CN II-XII grossly intact with no focal deficits. Alert and oriented to person, place, and time. Coordination, balance, strength, speech and gait are normal.  Skin: Skin is warm and dry. No rash noted. No erythema.  Psychiatric: Patient has a normal mood and affect. Behavior is normal in office today. Judgment and thought content normal in office today.   Recent Results (from the past 2160 hour(s))  CBC with Differential     Status: None   Collection Time: 01/22/15 11:20 AM  Result Value Ref Range   WBC 6.5 3.6 - 11.0 K/uL   RBC 4.88 3.80 - 5.20 MIL/uL   Hemoglobin 13.8 12.0 - 16.0 g/dL   HCT 41.3 35.0 - 47.0 %   MCV 84.6 80.0 - 100.0 fL   MCH 28.2 26.0 - 34.0 pg   MCHC 33.4 32.0 - 36.0 g/dL   RDW 13.6 11.5 - 14.5 %   Platelets 189 150 - 440 K/uL   Neutrophils Relative % 54 %   Neutro Abs 3.6 1.4 - 6.5 K/uL   Lymphocytes Relative 36 %   Lymphs Abs 2.3 1.0 - 3.6  K/uL   Monocytes Relative 6 %   Monocytes Absolute 0.4 0.2 - 0.9 K/uL   Eosinophils Relative 4 %   Eosinophils Absolute 0.2 0 - 0.7 K/uL   Basophils Relative 0 %   Basophils Absolute 0.0 0 - 0.1 K/uL  Comprehensive metabolic panel     Status: None   Collection Time: 01/22/15 11:20 AM  Result Value Ref Range   Sodium 139 135 - 145 mmol/L   Potassium 3.9 3.5 - 5.1 mmol/L   Chloride 104 101 - 111 mmol/L   CO2 29 22 - 32 mmol/L   Glucose, Bld 87 65 - 99 mg/dL   BUN 11 6 - 20 mg/dL   Creatinine, Ser 0.88 0.44 - 1.00 mg/dL   Calcium 9.3 8.9 - 10.3 mg/dL   Total Protein 7.9 6.5 - 8.1 g/dL   Albumin 4.4 3.5 - 5.0 g/dL   AST 21 15 - 41 U/L   ALT 30 14 - 54 U/L   Alkaline Phosphatase 64 38 - 126 U/L   Total Bilirubin 0.4 0.3 - 1.2 mg/dL   GFR calc non Af Amer >60 >60 mL/min   GFR calc Af Amer >60 >60 mL/min    Comment: (NOTE) The eGFR has been calculated using the CKD  EPI equation. This calculation has not been validated in all clinical situations. eGFR's persistently <60 mL/min signify possible Chronic Kidney Disease.    Anion gap 6 5 - 15  Urinalysis complete, with microscopic [facility])     Status: Abnormal   Collection Time: 01/22/15  3:50 PM  Result Value Ref Range   Color, Urine YELLOW (A) YELLOW   APPearance TURBID (A) CLEAR   Glucose, UA NEGATIVE NEGATIVE mg/dL   Bilirubin Urine NEGATIVE NEGATIVE   Ketones, ur TRACE (A) NEGATIVE mg/dL   Specific Gravity, Urine 1.027 1.005 - 1.030   Hgb urine dipstick NEGATIVE NEGATIVE   pH 5.0 5.0 - 8.0   Protein, ur 30 (A) NEGATIVE mg/dL   Nitrite NEGATIVE NEGATIVE   Leukocytes, UA 2+ (A) NEGATIVE   RBC / HPF 6-30 0 - 5 RBC/hpf   WBC, UA TOO NUMEROUS TO COUNT 0 - 5 WBC/hpf   Bacteria, UA RARE (A) NONE SEEN   Squamous Epithelial / LPF 6-30 (A) NONE SEEN   WBC Clumps PRESENT    Mucous PRESENT   Comprehensive metabolic panel     Status: Abnormal   Collection Time: 02/17/15 10:46 AM  Result Value Ref Range   Sodium 137 135 - 145 mmol/L   Potassium 3.7 3.5 - 5.1 mmol/L   Chloride 105 101 - 111 mmol/L   CO2 25 22 - 32 mmol/L   Glucose, Bld 100 (H) 65 - 99 mg/dL   BUN 10 6 - 20 mg/dL   Creatinine, Ser 1.00 0.44 - 1.00 mg/dL   Calcium 9.3 8.9 - 10.3 mg/dL   Total Protein 7.6 6.5 - 8.1 g/dL   Albumin 4.1 3.5 - 5.0 g/dL   AST 21 15 - 41 U/L   ALT 16 14 - 54 U/L   Alkaline Phosphatase 60 38 - 126 U/L   Total Bilirubin 0.3 0.3 - 1.2 mg/dL   GFR calc non Af Amer >60 >60 mL/min   GFR calc Af Amer >60 >60 mL/min    Comment: (NOTE) The eGFR has been calculated using the CKD EPI equation. This calculation has not been validated in all clinical situations. eGFR's persistently <60 mL/min signify possible Chronic Kidney Disease.    Anion gap 7  5 - 15  Lipase, blood     Status: None   Collection Time: 02/17/15 10:46 AM  Result Value Ref Range   Lipase 30 22 - 51 U/L  Troponin I     Status: None    Collection Time: 02/17/15 10:46 AM  Result Value Ref Range   Troponin I <0.03 <0.031 ng/mL    Comment:        NO INDICATION OF MYOCARDIAL INJURY.   CBC with Differential     Status: None   Collection Time: 02/17/15 10:46 AM  Result Value Ref Range   WBC 6.7 3.6 - 11.0 K/uL   RBC 4.68 3.80 - 5.20 MIL/uL   Hemoglobin 13.3 12.0 - 16.0 g/dL   HCT 39.0 35.0 - 47.0 %   MCV 83.3 80.0 - 100.0 fL   MCH 28.4 26.0 - 34.0 pg   MCHC 34.1 32.0 - 36.0 g/dL   RDW 13.7 11.5 - 14.5 %   Platelets 177 150 - 440 K/uL   Neutrophils Relative % 52 %   Neutro Abs 3.5 1.4 - 6.5 K/uL   Lymphocytes Relative 37 %   Lymphs Abs 2.5 1.0 - 3.6 K/uL   Monocytes Relative 7 %   Monocytes Absolute 0.4 0.2 - 0.9 K/uL   Eosinophils Relative 3 %   Eosinophils Absolute 0.2 0 - 0.7 K/uL   Basophils Relative 1 %   Basophils Absolute 0.0 0 - 0.1 K/uL     Assessment & Plan  1. Anxiety and depression Doing well despite recent events with her significant other. Continue prozac 102m one a day. Continue ativan as needed as this is helping with chronic nausea as well.  - LORazepam (ATIVAN) 0.5 MG tablet; Take 1 tablet (0.5 mg total) by mouth 2 (two) times daily as needed for anxiety (Nausea).  Dispense: 40 tablet; Refill: 5 - Prozac 27mtablet, take 1 tablet by mouth daily. Dispense: 40 tablet; Refill: 5  2. Nausea and vomiting in adult patient Long standing chronic problem with unclear etiology, last seen GI specialist 01/2015. No significant weight changes and [place] to remain stable form a clinical standpoint.   - metoCLOPramide (REGLAN) 10 MG tablet; Take 1 tablet (10 mg total) by mouth every 6 (six) hours as needed for nausea.  Dispense: 100 tablet; Refill: 5 - LORazepam (ATIVAN) 0.5 MG tablet; Take 1 tablet (0.5 mg total) by mouth 2 (two) times daily as needed for anxiety (Nausea).  Dispense: 40 tablet; Refill: 5

## 2015-04-22 NOTE — Telephone Encounter (Signed)
Repeat not due until 05/2015 I will order it during her physical exam next Friday.

## 2015-04-22 NOTE — Telephone Encounter (Signed)
Pt called and states that she called [facility] and they do not have an appt for her.

## 2015-04-28 ENCOUNTER — Emergency Department
Admission: EM | Admit: 2015-04-28 | Discharge: 2015-04-28 | Disposition: A | Discharge status: Home / Self Care | Payer: Medicaid Other | Attending: Emergency Medicine | Admitting: Emergency Medicine

## 2015-04-28 ENCOUNTER — Emergency Department: Payer: Medicaid Other

## 2015-04-28 ENCOUNTER — Encounter: Payer: Self-pay | Admitting: Emergency Medicine

## 2015-04-28 DIAGNOSIS — Z79899 Other long term (current) drug therapy: Secondary | ICD-10-CM | POA: Diagnosis not present

## 2015-04-28 DIAGNOSIS — K5900 Constipation, unspecified: Secondary | ICD-10-CM | POA: Insufficient documentation

## 2015-04-28 DIAGNOSIS — K625 Hemorrhage of anus and rectum: Secondary | ICD-10-CM | POA: Diagnosis not present

## 2015-04-28 LAB — COMPREHENSIVE METABOLIC PANEL
ALT: 43 U/L (ref 14–54)
ANION GAP: 9 (ref 5–15)
AST: 80 U/L — ABNORMAL HIGH (ref 15–41)
Albumin: 4.3 g/dL (ref 3.5–5.0)
Alkaline Phosphatase: 75 U/L (ref 38–126)
BUN: 11 mg/dL (ref 6–20)
CALCIUM: 9.1 mg/dL (ref 8.9–10.3)
CO2: 28 mmol/L (ref 22–32)
CREATININE: 1 mg/dL (ref 0.44–1.00)
Chloride: 102 mmol/L (ref 101–111)
GFR calc Af Amer: >60 mL/min (ref >60)
GFR calc non Af Amer: >60 mL/min (ref >60)
GLUCOSE: 100 mg/dL — AB (ref 65–99)
POTASSIUM: 3.5 mmol/L (ref 3.5–5.1)
SODIUM: 139 mmol/L (ref 135–145)
TOTAL PROTEIN: 7.8 g/dL (ref 6.5–8.1)
Total Bilirubin: 0.4 mg/dL (ref 0.3–1.2)

## 2015-04-28 LAB — CBC WITH DIFFERENTIAL/PLATELET
BASOS ABS: 0 10*3/uL (ref 0–0.1)
BASOS PCT: 1 %
EOS ABS: 0.2 10*3/uL (ref 0–0.7)
Eosinophils Relative: 3 %
HEMATOCRIT: 40.3 % (ref 35.0–47.0)
Hemoglobin: 13.6 g/dL (ref 12.0–16.0)
Lymphocytes Relative: 28 %
Lymphs Abs: 1.8 10*3/uL (ref 1.0–3.6)
MCH: 28.8 pg (ref 26.0–34.0)
MCHC: 33.8 g/dL (ref 32.0–36.0)
MCV: 85 fL (ref 80.0–100.0)
MONO ABS: 0.3 10*3/uL (ref 0.2–0.9)
Monocytes Relative: 5 %
NEUTROS ABS: 4.2 10*3/uL (ref 1.4–6.5)
Neutrophils Relative %: 63 %
Platelets: 182 10*3/uL (ref 150–440)
RBC: 4.74 MIL/uL (ref 3.80–5.20)
RDW: 14.1 % (ref 11.5–14.5)
WBC: 6.6 10*3/uL (ref 3.6–11.0)

## 2015-04-28 LAB — URINALYSIS COMPLETE WITH MICROSCOPIC (ARMC ONLY)
BILIRUBIN URINE: NEGATIVE
Glucose, UA: NEGATIVE mg/dL
KETONES UR: NEGATIVE mg/dL
NITRITE: NEGATIVE
PH: 5 (ref 5.0–8.0)
PROTEIN: 30 mg/dL — AB
SPECIFIC GRAVITY, URINE: 1.026 (ref 1.005–1.030)

## 2015-04-28 LAB — LIPASE, BLOOD: Lipase: 24 U/L (ref 22–51)

## 2015-04-28 MED ORDER — ONDANSETRON HCL 4 MG/2ML IJ SOLN
4.0000 mg | Freq: Once | INTRAMUSCULAR | Status: AC
  Administered 2015-04-28: 4 mg via INTRAVENOUS
  Filled 2015-04-28: qty 2

## 2015-04-28 MED ORDER — ACETAMINOPHEN 325 MG PO TABS
650.0000 mg | ORAL_TABLET | Freq: Once | ORAL | Status: AC
  Administered 2015-04-28: 650 mg via ORAL
  Filled 2015-04-28: qty 2

## 2015-04-28 NOTE — ED Notes (Signed)
Constipated x 4 days, nausea today and blood streaked stool. Denies hemorrhoids. Out of meds x 1 month

## 2015-04-28 NOTE — ED Provider Notes (Signed)
Time Seen: Approximately ----------------------------------------- 12:19 PM on 04/28/2015 -----------------------------------------   I have reviewed the triage notes  Chief Complaint: Constipation   History of Present Illness: Kristina Reed is a 49 y.o. female who states a 4 day history of constipation. Patient stated some small amount of rectal bleeding close to teaspoon with her hard bowel movements. She does state some rectal pain. She has not had any recent colonoscopy. She states that she's had some occasional nausea and vomits especially with certain meals. She describes some epigastric abdominal pain apparently was on some long-term medication for gastroesophageal reflux disease) off her medication for some time. She denies any fever or persistent vomiting. She denies any chest or back or flank discomfort. She denies any fever or chills hematemesis or biliary emesis. She states the rectal bleeding she's had has had no clots or dark tarry appearance. She she states occasionally her stool mixed in with it.   Past Medical History  Diagnosis Date  . Thyroid disease   . Arthritis     right knee and right elbow  . Gallstones   . Vaginal inclusion cyst   . Cancer     Cervical CA with partial hysterectomy.  . Status post partial hysterectomy     Due to Cervical CA  . GERD (gastroesophageal reflux disease)     Patient Active Problem List   Diagnosis Date Noted  . Nausea and vomiting in adult patient 02/18/2015  . Bursitis of knee 07/23/2014  . Anxiety and depression 02/02/2014  . Bladder dysfunction 02/02/2014  . Acid reflux 02/02/2014  . Adult hypothyroidism 02/02/2014  . Obstructive apnea 02/02/2014  . Incomplete bladder emptying 12/12/2012  . Tenosynovitis of foot 05/30/2012  . Benign neoplasm of kidney 05/13/2012  . Symptoms involving urinary system 05/13/2012  . Urge incontinence 05/13/2012  . FOM (frequency of micturition) 05/13/2012  . Extremity pain 05/05/2012  .  Tarsal tunnel syndrome 03/03/2012    Past Surgical History  Procedure Laterality Date  . Abdominal hysterectomy      partial hysterectomy    Past Surgical History  Procedure Laterality Date  . Abdominal hysterectomy      partial hysterectomy    Current Outpatient Rx  Name  Route  Sig  Dispense  Refill  . docusate sodium (STOOL SOFTENER) 100 MG capsule   Oral   Take 100 mg by mouth.         Marland Kitchen FLUoxetine (PROZAC) 20 MG capsule   Oral   Take 1 capsule (20 mg total) by mouth daily.   30 capsule   5   . levothyroxine (SYNTHROID, LEVOTHROID) 50 MCG tablet   Oral   Take by mouth.         Marland Kitchen LORazepam (ATIVAN) 0.5 MG tablet   Oral   Take 1 tablet (0.5 mg total) by mouth 2 (two) times daily as needed for anxiety (Nausea).   40 tablet   5   . meloxicam (MOBIC) 15 MG tablet   Oral   Take 15 mg by mouth.         . metoCLOPramide (REGLAN) 10 MG tablet   Oral   Take 1 tablet (10 mg total) by mouth every 6 (six) hours as needed for nausea.   100 tablet   5   . omeprazole (PRILOSEC) 40 MG capsule   Oral   Take by mouth.         . ondansetron (ZOFRAN) 4 MG tablet   Oral   Take by  mouth.         . promethazine (PHENERGAN) 25 MG suppository   Rectal   Place 25 mg rectally every 6 (six) hours as needed for nausea or vomiting.         . sucralfate (CARAFATE) 1 GM/10ML suspension   Oral   Take by mouth.         . traMADol (ULTRAM) 50 MG tablet   Oral   Take by mouth every 6 (six) hours as needed.         . traZODone (DESYREL) 50 MG tablet   Oral   Take 50 mg by mouth.           Allergies:  Percocet  Family History: Family History  Problem Relation Age of Onset  . Diabetes Mother     Social History: Social History  Substance Use Topics  . Smoking status: Never Smoker   . Smokeless tobacco: None  . Alcohol Use: No     Review of Systems:   10 point review of systems was performed and was otherwise negative:  Constitutional: No  fever Eyes: No visual disturbances ENT: No sore throat, ear pain Cardiac: No chest pain Respiratory: No shortness of breath, wheezing, or stridor Abdomen: Abdominal pains toward the epigastric area seems to be crampy and intermittent in nature with no associated diarrhea. Admits to constipation. Endocrine: No weight loss, No night sweats Extremities: No peripheral edema, cyanosis Skin: No rashes, easy bruising Neurologic: No focal weakness, trouble with speech or swollowing Urologic: No dysuria, Hematuria, or urinary frequency   Physical Exam:  ED Triage Vitals  Enc Vitals Group     BP 04/28/15 1157 130/90 mmHg     Pulse Rate 04/28/15 1157 75     Resp 04/28/15 1157 16     Temp 04/28/15 1157 98.2 F (36.8 C)     Temp Source 04/28/15 1157 Oral     SpO2 04/28/15 1157 99 %     Weight 04/28/15 1157 162 lb (73.483 kg)     Height 04/28/15 1157 5\' 1"  (1.549 m)     Head Cir --      Peak Flow --      Pain Score 04/28/15 1159 10     Pain Loc --      Pain Edu? --      Excl. in [place]? --     General: Awake , Alert , and Oriented times 3; GCS 15 Head: Normal cephalic , atraumatic Eyes: Pupils equal , round, reactive to light Nose/Throat: No nasal drainage, patent upper airway without erythema or exudate.  Neck: Supple, Full range of motion, No anterior adenopathy or palpable thyroid masses Lungs: Clear to ascultation without wheezes , rhonchi, or rales Heart: Regular rate, regular rhythm without murmurs , gallops , or rubs Abdomen: Soft, non tender without rebound, guarding , or rigidity; bowel sounds positive and symmetric in all 4 quadrants. No organomegaly .        Extremities: 2 plus symmetric pulses. No edema, clubbing or cyanosis Neurologic: normal ambulation, Motor symmetric without deficits, sensory intact Skin: warm, dry, no rashes Rectal: Normal sphincter tone, external hemorrhoid old and well-healed. Rectal exam performed with chaperone present. Guaiac positive with no palpable  masses or any abnormal stool in the rectal vault.  Labs:   All laboratory work was reviewed including any pertinent negatives or positives listed below:  Labs Reviewed  URINALYSIS COMPLETEWITH MICROSCOPIC ([place])  COMPREHENSIVE METABOLIC PANEL  LIPASE, BLOOD  CBC WITH DIFFERENTIAL/PLATELET  EKG: None   Radiology:  I personally reviewed the radiologic studies   Procedures: G ABDOMEN ACUTE W/ 1V CHEST  COMPARISON: May 07, 2014.  FINDINGS: There is no evidence of dilated bowel loops or free intraperitoneal air. No radiopaque calculi or other significant radiographic abnormality is seen. Heart size and mediastinal contours are within normal limits. Both lungs are clear. Status post cholecystectomy. No significant stool burden is noted.  IMPRESSION: No evidence of bowel obstruction or ileus. No acute cardiopulmonary disease.    Critical Care: None    ED Course: Patient's stay here was uneventful and review the records shows she had a lot of similar laboratory work performed and has been prescribed medications for her nausea and vomiting which seemed to be chronic in nature. He said has not got her medications filled which have helped her before in the past and she recently saw her primary physician was referred to a gastroenterologist.  Patient does not appear to have a bowel obstruction or any other acute pathology requiring inpatient management at this time. She does need to follow up with the gastroenterologist for possible colonoscopy and is understanding we do not know  exactly where bleeding is coming from on this particular ED visit.  Assessment:  Constipation Stable lower gastrointestinal bleed (likely internal hemorrhoid versus rectal tear) Chronic vomiting History gastroesophageal reflux disease    Final Clinical Impression: Constipation  Patient was advised to return immediately if condition worsens. Patient was advised to follow up with her  primary care physician or other specialized physicians involved and in their current assessment.  Final diagnoses:  None                Daymon Larsen, MD 04/28/15 [phone]

## 2015-04-30 ENCOUNTER — Encounter: Payer: Self-pay | Admitting: Family Medicine

## 2015-04-30 ENCOUNTER — Other Ambulatory Visit: Payer: Self-pay | Admitting: Family Medicine

## 2015-04-30 ENCOUNTER — Telehealth: Payer: Self-pay | Admitting: Family Medicine

## 2015-04-30 ENCOUNTER — Ambulatory Visit (INDEPENDENT_AMBULATORY_CARE_PROVIDER_SITE_OTHER): Payer: Medicaid Other | Admitting: Family Medicine

## 2015-04-30 VITALS — BP 126/82 | HR 86 | Temp 98.5°F | Resp 16 | Wt 161.2 lb

## 2015-04-30 DIAGNOSIS — K625 Hemorrhage of anus and rectum: Secondary | ICD-10-CM | POA: Diagnosis not present

## 2015-04-30 MED ORDER — HYDROCORTISONE ACETATE 25 MG RE SUPP: 25.0000 mg | Freq: Two times a day (BID) | RECTAL | Status: DC

## 2015-04-30 MED ORDER — HYDROCORTISONE 2.5 % RE CREA: 1.0000 "application " | TOPICAL_CREAM | Freq: Two times a day (BID) | RECTAL | Status: DC

## 2015-04-30 NOTE — Progress Notes (Signed)
Name: Kristina Reed   MRN: [id number]    DOB: [birth year]   Date:04/30/2015       Progress Note  Subjective  Chief Complaint  Chief Complaint  Patient presents with  . Rectal Bleeding    patient was scheduled today with [facility] for 05/09/15 at 2:15pm with Dr. Arther Dames    HPI  Kristina Reed is a 49 year old female with chronic gastrointestinal issues who is here today for a ER follow up after having spontaneous bright red blood pre rectum. In the ER her vitals and lab work was stable and so she was advised to follow up with her GI specialist for a possible colonoscopy.  Imaging done at the ER showed no bowel obstruction, ileus or constipation.  She continues to complain of epigastric pain although she has not been vomiting as much as she usually does. She denies LLQ pain or rectal pain. The rectal bleeding is not profuse but is still occuring intermittently. She denies fatigue, chest pain, palpitations, fevers, chills, vaginal bleeding, rectal mass.   Patient Active Problem List   Diagnosis Date Noted  . Nausea and vomiting in adult patient 02/18/2015  . Bursitis of knee 07/23/2014  . Anxiety and depression 02/02/2014  . Bladder dysfunction 02/02/2014  . Acid reflux 02/02/2014  . Adult hypothyroidism 02/02/2014  . Obstructive apnea 02/02/2014  . Incomplete bladder emptying 12/12/2012  . Tenosynovitis of foot 05/30/2012  . Benign neoplasm of kidney 05/13/2012  . Symptoms involving urinary system 05/13/2012  . Urge incontinence 05/13/2012  . FOM (frequency of micturition) 05/13/2012  . Extremity pain 05/05/2012  . Tarsal tunnel syndrome 03/03/2012    Social History  Substance Use Topics  . Smoking status: Never Smoker   . Smokeless tobacco: Not on file  . Alcohol Use: No     Current outpatient prescriptions:  .  docusate sodium (STOOL SOFTENER) 100 MG capsule, Take 100 mg by mouth., Disp: , Rfl:  .  FLUoxetine (PROZAC) 20 MG capsule, Take 1 capsule (20 mg total) by mouth  daily., Disp: 30 capsule, Rfl: 5 .  levothyroxine (SYNTHROID, LEVOTHROID) 50 MCG tablet, Take by mouth., Disp: , Rfl:  .  LORazepam (ATIVAN) 0.5 MG tablet, Take 1 tablet (0.5 mg total) by mouth 2 (two) times daily as needed for anxiety (Nausea)., Disp: 40 tablet, Rfl: 5 .  meloxicam (MOBIC) 15 MG tablet, Take 15 mg by mouth., Disp: , Rfl:  .  metoCLOPramide (REGLAN) 10 MG tablet, Take 1 tablet (10 mg total) by mouth every 6 (six) hours as needed for nausea., Disp: 100 tablet, Rfl: 5 .  omeprazole (PRILOSEC) 40 MG capsule, Take by mouth., Disp: , Rfl:  .  ondansetron (ZOFRAN) 4 MG tablet, Take by mouth., Disp: , Rfl:  .  promethazine (PHENERGAN) 25 MG suppository, Place 25 mg rectally every 6 (six) hours as needed for nausea or vomiting., Disp: , Rfl:  .  sucralfate (CARAFATE) 1 GM/10ML suspension, Take by mouth., Disp: , Rfl:  .  traMADol (ULTRAM) 50 MG tablet, Take by mouth every 6 (six) hours as needed., Disp: , Rfl:  .  traZODone (DESYREL) 50 MG tablet, Take 50 mg by mouth., Disp: , Rfl:   Past Surgical History  Procedure Laterality Date  . Abdominal hysterectomy      partial hysterectomy    Family History  Problem Relation Age of Onset  . Diabetes Mother     Allergies  Allergen Reactions  . Percocet [Oxycodone-Acetaminophen] Palpitations  Review of Systems  CONSTITUTIONAL: No significant weight changes, fever, chills, weakness or fatigue.  HEENT:  - Eyes: No visual changes.  - Ears: No auditory changes. No pain.  - Nose: No sneezing, congestion, runny nose. - Throat: No sore throat. No changes in swallowing. SKIN: No rash or itching.  CARDIOVASCULAR: No chest pain, chest pressure or chest discomfort. No palpitations or edema.  RESPIRATORY: No shortness of breath, cough or sputum.  GASTROINTESTINAL: Chronic nausea, vomiting. No changes in bowel habits. Yes abdominal pain and rectal bleeding. GENITOURINARY: No dysuria. No frequency. No discharge. NEUROLOGICAL: No  headache, dizziness, syncope, paralysis, ataxia, numbness or tingling in the extremities. No memory changes. No change in bowel or bladder control.  MUSCULOSKELETAL: No joint pain. No muscle pain. HEMATOLOGIC: No anemia, bleeding or bruising.  LYMPHATICS: No enlarged lymph nodes.  PSYCHIATRIC: No change in mood. No change in sleep pattern.  ENDOCRINOLOGIC: No reports of sweating, cold or heat intolerance. No polyuria or polydipsia.     Objective  BP 126/82 mmHg  Pulse 86  Temp(Src) 98.5 F (36.9 C) (Oral)  Resp 16  Wt 161 lb 3.2 oz (73.12 kg)  SpO2 97%  LMP 05/09/1993 (Approximate) Body mass index is 30.47 kg/(m^2).  Physical Exam  Constitutional: Patient appears well-developed and well-nourished. In no distress.  HEENT:  - Head: Normocephalic and atraumatic.  - Ears: Bilateral TMs gray, no erythema or effusion - Nose: Nasal mucosa moist - Mouth/Throat: Oropharynx is clear and moist. No tonsillar hypertrophy or erythema. No post nasal drainage.  - Eyes: Conjunctivae clear, EOM movements normal. PERRLA. No scleral icterus. Wears glasses with slight strabismus.  Neck: Normal range of motion. Neck supple. No JVD present. No thyromegaly present.  Cardiovascular: Normal rate, regular rhythm and normal heart sounds.  No murmur heard.  Pulmonary/Chest: Effort normal and breath sounds normal. No respiratory distress. Abdomen: Soft, non tender, non distended, no guarding, no HSM, normal bowel sounds in all four quadrants. Patient deferred rectal exam. Musculoskeletal: Normal range of motion bilateral UE and LE, no joint effusions. Peripheral vascular: Bilateral LE no edema. Neurological: CN II-XII grossly intact with no focal deficits. Alert and oriented to person, place, and time. Coordination, balance, strength, speech and gait are normal.  Skin: Skin is warm and dry. No rash noted. No erythema.  Psychiatric: Patient has a stable mood and affect. Behavior is normal in office today.  Judgment and thought content normal in office today.   Recent Results (from the past 2160 hour(s))  Comprehensive metabolic panel     Status: Abnormal   Collection Time: 02/17/15 10:46 AM  Result Value Ref Range   Sodium 137 135 - 145 mmol/L   Potassium 3.7 3.5 - 5.1 mmol/L   Chloride 105 101 - 111 mmol/L   CO2 25 22 - 32 mmol/L   Glucose, Bld 100 (H) 65 - 99 mg/dL   BUN 10 6 - 20 mg/dL   Creatinine, Ser 1.00 0.44 - 1.00 mg/dL   Calcium 9.3 8.9 - 10.3 mg/dL   Total Protein 7.6 6.5 - 8.1 g/dL   Albumin 4.1 3.5 - 5.0 g/dL   AST 21 15 - 41 U/L   ALT 16 14 - 54 U/L   Alkaline Phosphatase 60 38 - 126 U/L   Total Bilirubin 0.3 0.3 - 1.2 mg/dL   GFR calc non Af Amer >60 >60 mL/min   GFR calc Af Amer >60 >60 mL/min    Comment: (NOTE) The eGFR has been calculated using the CKD EPI  equation. This calculation has not been validated in all clinical situations. eGFR's persistently <60 mL/min signify possible Chronic Kidney Disease.    Anion gap 7 5 - 15  Lipase, blood     Status: None   Collection Time: 02/17/15 10:46 AM  Result Value Ref Range   Lipase 30 22 - 51 U/L  Troponin I     Status: None   Collection Time: 02/17/15 10:46 AM  Result Value Ref Range   Troponin I <0.03 <0.031 ng/mL    Comment:        NO INDICATION OF MYOCARDIAL INJURY.   CBC with Differential     Status: None   Collection Time: 02/17/15 10:46 AM  Result Value Ref Range   WBC 6.7 3.6 - 11.0 K/uL   RBC 4.68 3.80 - 5.20 MIL/uL   Hemoglobin 13.3 12.0 - 16.0 g/dL   HCT 39.0 35.0 - 47.0 %   MCV 83.3 80.0 - 100.0 fL   MCH 28.4 26.0 - 34.0 pg   MCHC 34.1 32.0 - 36.0 g/dL   RDW 13.7 11.5 - 14.5 %   Platelets 177 150 - 440 K/uL   Neutrophils Relative % 52 %   Neutro Abs 3.5 1.4 - 6.5 K/uL   Lymphocytes Relative 37 %   Lymphs Abs 2.5 1.0 - 3.6 K/uL   Monocytes Relative 7 %   Monocytes Absolute 0.4 0.2 - 0.9 K/uL   Eosinophils Relative 3 %   Eosinophils Absolute 0.2 0 - 0.7 K/uL   Basophils Relative 1 %    Basophils Absolute 0.0 0 - 0.1 K/uL  Urinalysis complete, with microscopic     Status: Abnormal   Collection Time: 04/28/15 12:08 PM  Result Value Ref Range   Color, Urine AMBER (A) YELLOW   APPearance CLOUDY (A) CLEAR   Glucose, UA NEGATIVE NEGATIVE mg/dL   Bilirubin Urine NEGATIVE NEGATIVE   Ketones, ur NEGATIVE NEGATIVE mg/dL   Specific Gravity, Urine 1.026 1.005 - 1.030   Hgb urine dipstick 1+ (A) NEGATIVE   pH 5.0 5.0 - 8.0   Protein, ur 30 (A) NEGATIVE mg/dL   Nitrite NEGATIVE NEGATIVE   Leukocytes, UA 3+ (A) NEGATIVE   RBC / HPF 6-30 0 - 5 RBC/hpf   WBC, UA TOO NUMEROUS TO COUNT 0 - 5 WBC/hpf   Bacteria, UA RARE (A) NONE SEEN   Squamous Epithelial / LPF TOO NUMEROUS TO COUNT (A) NONE SEEN   Mucous PRESENT   Comprehensive metabolic panel     Status: Abnormal   Collection Time: 04/28/15 12:08 PM  Result Value Ref Range   Sodium 139 135 - 145 mmol/L   Potassium 3.5 3.5 - 5.1 mmol/L   Chloride 102 101 - 111 mmol/L   CO2 28 22 - 32 mmol/L   Glucose, Bld 100 (H) 65 - 99 mg/dL   BUN 11 6 - 20 mg/dL   Creatinine, Ser 1.00 0.44 - 1.00 mg/dL   Calcium 9.1 8.9 - 10.3 mg/dL   Total Protein 7.8 6.5 - 8.1 g/dL   Albumin 4.3 3.5 - 5.0 g/dL   AST 80 (H) 15 - 41 U/L   ALT 43 14 - 54 U/L   Alkaline Phosphatase 75 38 - 126 U/L   Total Bilirubin 0.4 0.3 - 1.2 mg/dL   GFR calc non Af Amer >60 >60 mL/min   GFR calc Af Amer >60 >60 mL/min    Comment: (NOTE) The eGFR has been calculated using the CKD EPI equation. This calculation has  not been validated in all clinical situations. eGFR's persistently <60 mL/min signify possible Chronic Kidney Disease.    Anion gap 9 5 - 15  Lipase, blood     Status: None   Collection Time: 04/28/15 12:08 PM  Result Value Ref Range   Lipase 24 22 - 51 U/L  CBC with Differential     Status: None   Collection Time: 04/28/15 12:08 PM  Result Value Ref Range   WBC 6.6 3.6 - 11.0 K/uL   RBC 4.74 3.80 - 5.20 MIL/uL   Hemoglobin 13.6 12.0 - 16.0 g/dL    HCT 40.3 35.0 - 47.0 %   MCV 85.0 80.0 - 100.0 fL   MCH 28.8 26.0 - 34.0 pg   MCHC 33.8 32.0 - 36.0 g/dL   RDW 14.1 11.5 - 14.5 %   Platelets 182 150 - 440 K/uL   Neutrophils Relative % 63 %   Neutro Abs 4.2 1.4 - 6.5 K/uL   Lymphocytes Relative 28 %   Lymphs Abs 1.8 1.0 - 3.6 K/uL   Monocytes Relative 5 %   Monocytes Absolute 0.3 0.2 - 0.9 K/uL   Eosinophils Relative 3 %   Eosinophils Absolute 0.2 0 - 0.7 K/uL   Basophils Relative 1 %   Basophils Absolute 0.0 0 - 0.1 K/uL     Assessment & Plan  1. Rectal bleeding Etiologies unclear but considerations include bleeding hemorrhoid, rectal mass/polyp/cancer, rectal protrusion, fissure, fistula, diverticular disorder.  Patient deferred rectal exam as she has CPE coming up this Friday at which time she was willing to undress and be thoroughly examined. She is clinically stable. We called over to [facility] and made her an appointment. Anusol-HC prescribed. Precautions to return to ER provided as well as patient instructed to change her diet to liquid based diet.   - hydrocortisone (ANUSOL-HC) 25 MG suppository; Place 1 suppository (25 mg total) rectally 2 (two) times daily.  Dispense: 12 suppository; Refill: 0

## 2015-04-30 NOTE — Patient Instructions (Signed)
Rectal Bleeding °Rectal bleeding is when blood passes out of the anus. It is usually a sign that something is wrong. It may not be serious, but it should always be evaluated. Rectal bleeding may present as bright red blood or extremely dark stools. The color may range from dark red or maroon to black (like tar). It is important that the cause of rectal bleeding be identified so treatment can be started and the problem corrected. °CAUSES  °· Hemorrhoids. These are enlarged (dilated) blood vessels or veins in the anal or rectal area. °· Fistulas. These are abnormal, burrowing channels that usually run from inside the rectum to the skin around the anus. They can bleed. °· Anal fissures. This is a tear in the tissue of the anus. Bleeding occurs with bowel movements. °· Diverticulosis. This is a condition in which pockets or sacs project from the bowel wall. Occasionally, the sacs can bleed. °· Diverticulitis. This is an infection involving diverticulosis of the colon. °· Proctitis and colitis. These are conditions in which the rectum, colon, or both, can become inflamed and pitted (ulcerated). °· Polyps and cancer. Polyps are non-cancerous (benign) growths in the colon that may bleed. Certain types of polyps turn into cancer. °· Protrusion of the rectum. Part of the rectum can project from the anus and bleed. °· Certain medicines. °· Intestinal infections. °· Blood vessel abnormalities. °HOME CARE INSTRUCTIONS °· Eat a high-fiber diet to keep your stool soft. °· Limit activity. °· Drink enough fluids to keep your urine clear or pale yellow. °· Warm baths may be useful to soothe rectal pain. °· Follow up with your caregiver as directed. °SEEK IMMEDIATE MEDICAL CARE IF: °· You develop increased bleeding. °· You have black or dark red stools. °· You vomit blood or material that looks like coffee grounds. °· You have abdominal pain or tenderness. °· You have a fever. °· You feel weak, nauseous, or you faint. °· You have  severe rectal pain or you are unable to have a bowel movement. °MAKE SURE YOU: °· Understand these instructions. °· Will watch your condition. °· Will get help right away if you are not doing well or get worse. °Document Released: 02/20/2002 Document Revised: 11/23/2011 Document Reviewed: 02/15/2011 °[employer]® Patient Information ©2015 [employer]. This information is not intended to replace advice given to you by your health care provider. Make sure you discuss any questions you have with your health care provider. ° °

## 2015-04-30 NOTE — Telephone Encounter (Signed)
Pt states that the meds that Dr Nadine Counts prescribed to her this morning is not covered under medicaid and cost $125.00. Pt wants to know if you could send something else in for her.[facility].

## 2015-04-30 NOTE — Telephone Encounter (Signed)
Her insurance might not pay for hemorrhoid creams or suppositories, I did send in a new cream, if that is not affordable use the OTC medications available to her.

## 2015-05-03 ENCOUNTER — Telehealth: Payer: Self-pay | Admitting: Family Medicine

## 2015-05-03 ENCOUNTER — Encounter: Payer: Self-pay | Admitting: Family Medicine

## 2015-05-03 ENCOUNTER — Ambulatory Visit (INDEPENDENT_AMBULATORY_CARE_PROVIDER_SITE_OTHER): Payer: Medicaid Other | Admitting: Family Medicine

## 2015-05-03 VITALS — BP 122/76 | HR 75 | Temp 97.8°F | Resp 16 | Wt 160.0 lb

## 2015-05-03 DIAGNOSIS — K648 Other hemorrhoids: Secondary | ICD-10-CM | POA: Diagnosis not present

## 2015-05-03 DIAGNOSIS — K625 Hemorrhage of anus and rectum: Secondary | ICD-10-CM | POA: Diagnosis not present

## 2015-05-03 DIAGNOSIS — Z0000 Encounter for general adult medical examination without abnormal findings: Secondary | ICD-10-CM

## 2015-05-03 DIAGNOSIS — N6310 Unspecified lump in the right breast, unspecified quadrant: Secondary | ICD-10-CM

## 2015-05-03 DIAGNOSIS — N63 Unspecified lump in breast: Secondary | ICD-10-CM | POA: Diagnosis not present

## 2015-05-03 DIAGNOSIS — Z124 Encounter for screening for malignant neoplasm of cervix: Secondary | ICD-10-CM | POA: Diagnosis not present

## 2015-05-03 DIAGNOSIS — K644 Residual hemorrhoidal skin tags: Secondary | ICD-10-CM

## 2015-05-03 NOTE — Telephone Encounter (Signed)
Patient was encouraged to continue to use the Preparation H unitl seen by the GI Specialist next Thursday.

## 2015-05-03 NOTE — Telephone Encounter (Signed)
Pt was seen today and would like to know if you would like for her to continue to use the Preparation H until she see her GI doctor?

## 2015-05-04 NOTE — Progress Notes (Signed)
Name: Kristina Reed   MRN: [id number]    DOB: Feb 04, 1966   Date:05/04/2015       Progress Note  Subjective  Chief Complaint  Chief Complaint  Patient presents with  . Annual Exam    HPI  Patient is here today for a Complete Female Physical Exam:  The patient has no unusually complaints today other than ongoing issues with rectal bleeding intermittently.  Overall feels healthy. Diet is not well balanced. In general does not exercise regularly. Sees dentist regularly and addresses vision concerns with ophthalmologist if applicable. In regards to sexual activity the patient is currently sexually active. Currently is not concerned about exposure to any STDs. Due to have 6 month repeat diagnostic mammogram and ultrasound of right breast mass found previously.  Past Medical History  Diagnosis Date  . Thyroid disease   . Arthritis     right knee and right elbow  . Gallstones   . Vaginal inclusion cyst   . Cancer     Cervical CA with partial hysterectomy.  . Status post partial hysterectomy     Due to Cervical CA  . GERD (gastroesophageal reflux disease)     Past Surgical History  Procedure Laterality Date  . Abdominal hysterectomy      partial hysterectomy    Family History  Problem Relation Age of Onset  . Diabetes Mother     Social History   Social History  . Marital Status: Significant Other    Spouse Name: N/A  . Number of Children: N/A  . Years of Education: N/A   Occupational History  . Not on file.   Social History Main Topics  . Smoking status: Never Smoker   . Smokeless tobacco: Not on file  . Alcohol Use: No  . Drug Use: Not on file  . Sexual Activity: Not on file   Other Topics Concern  . Not on file   Social History Narrative     Current outpatient prescriptions:  .  docusate sodium (STOOL SOFTENER) 100 MG capsule, Take 100 mg by mouth., Disp: , Rfl:  .  FLUoxetine (PROZAC) 20 MG capsule, Take 1 capsule (20 mg total) by mouth daily., Disp: 30  capsule, Rfl: 5 .  hydrocortisone (ANUSOL-HC) 2.5 % rectal cream, Place 1 application rectally 2 (two) times daily., Disp: 30 g, Rfl: 0 .  levothyroxine (SYNTHROID, LEVOTHROID) 50 MCG tablet, Take by mouth., Disp: , Rfl:  .  LORazepam (ATIVAN) 0.5 MG tablet, Take 1 tablet (0.5 mg total) by mouth 2 (two) times daily as needed for anxiety (Nausea)., Disp: 40 tablet, Rfl: 5 .  meloxicam (MOBIC) 15 MG tablet, Take 15 mg by mouth., Disp: , Rfl:  .  metoCLOPramide (REGLAN) 10 MG tablet, Take 1 tablet (10 mg total) by mouth every 6 (six) hours as needed for nausea., Disp: 100 tablet, Rfl: 5 .  omeprazole (PRILOSEC) 40 MG capsule, Take by mouth., Disp: , Rfl:  .  ondansetron (ZOFRAN) 4 MG tablet, Take by mouth., Disp: , Rfl:  .  promethazine (PHENERGAN) 25 MG suppository, Place 25 mg rectally every 6 (six) hours as needed for nausea or vomiting., Disp: , Rfl:  .  sucralfate (CARAFATE) 1 GM/10ML suspension, Take by mouth., Disp: , Rfl:  .  traMADol (ULTRAM) 50 MG tablet, Take by mouth every 6 (six) hours as needed., Disp: , Rfl:  .  traZODone (DESYREL) 50 MG tablet, Take 50 mg by mouth., Disp: , Rfl:   Allergies  Allergen Reactions  .  Percocet [Oxycodone-Acetaminophen] Palpitations    ROS  CONSTITUTIONAL: No significant weight changes, fever, chills, weakness or fatigue.  HEENT:  - Eyes: No visual changes.  - Ears: No auditory changes. No pain.  - Nose: No sneezing, congestion, runny nose. - Throat: No sore throat. No changes in swallowing. SKIN: No rash or itching.  CARDIOVASCULAR: No chest pain, chest pressure or chest discomfort. No palpitations or edema.  RESPIRATORY: No shortness of breath, cough or sputum.  GASTROINTESTINAL: No anorexia, nausea, vomiting. No changes in bowel habits. No abdominal pain.  GENITOURINARY: No dysuria. No frequency. No discharge.  NEUROLOGICAL: No headache, dizziness, syncope, paralysis, ataxia, numbness or tingling in the extremities. No memory changes. No  change in bowel or bladder control.  MUSCULOSKELETAL: No joint pain. No muscle pain. HEMATOLOGIC: No anemia, bleeding or bruising.  LYMPHATICS: No enlarged lymph nodes.  PSYCHIATRIC: No change in mood. No change in sleep pattern.  ENDOCRINOLOGIC: No reports of sweating, cold or heat intolerance. No polyuria or polydipsia.   Objective  Filed Vitals:   05/03/15 1203  BP: 122/76  Pulse: 75  Temp: 97.8 F (36.6 C)  TempSrc: Oral  Resp: 16  Weight: 160 lb (72.576 kg)  SpO2: 97%   Body mass index is 30.25 kg/(m^2).  Depression screen [facility] 2/9 04/22/2015  Decreased Interest 2  Down, Depressed, Hopeless 2  PHQ - 2 Score 4  Altered sleeping 1  Tired, decreased energy 1  Change in appetite 0  Feeling bad or failure about yourself  1  Trouble concentrating 1  Moving slowly or fidgety/restless 0  Suicidal thoughts 0  PHQ-9 Score 8  Difficult doing work/chores Somewhat difficult      Recent Results (from the past 2160 hour(s))  Comprehensive metabolic panel     Status: Abnormal   Collection Time: 02/17/15 10:46 AM  Result Value Ref Range   Sodium 137 135 - 145 mmol/L   Potassium 3.7 3.5 - 5.1 mmol/L   Chloride 105 101 - 111 mmol/L   CO2 25 22 - 32 mmol/L   Glucose, Bld 100 (H) 65 - 99 mg/dL   BUN 10 6 - 20 mg/dL   Creatinine, Ser 1.00 0.44 - 1.00 mg/dL   Calcium 9.3 8.9 - 10.3 mg/dL   Total Protein 7.6 6.5 - 8.1 g/dL   Albumin 4.1 3.5 - 5.0 g/dL   AST 21 15 - 41 U/L   ALT 16 14 - 54 U/L   Alkaline Phosphatase 60 38 - 126 U/L   Total Bilirubin 0.3 0.3 - 1.2 mg/dL   GFR calc non Af Amer >60 >60 mL/min   GFR calc Af Amer >60 >60 mL/min    Comment: (NOTE) The eGFR has been calculated using the CKD EPI equation. This calculation has not been validated in all clinical situations. eGFR's persistently <60 mL/min signify possible Chronic Kidney Disease.    Anion gap 7 5 - 15  Lipase, blood     Status: None   Collection Time: 02/17/15 10:46 AM  Result Value Ref Range    Lipase 30 22 - 51 U/L  Troponin I     Status: None   Collection Time: 02/17/15 10:46 AM  Result Value Ref Range   Troponin I <0.03 <0.031 ng/mL    Comment:        NO INDICATION OF MYOCARDIAL INJURY.   CBC with Differential     Status: None   Collection Time: 02/17/15 10:46 AM  Result Value Ref Range   WBC 6.7 3.6 -  11.0 K/uL   RBC 4.68 3.80 - 5.20 MIL/uL   Hemoglobin 13.3 12.0 - 16.0 g/dL   HCT 39.0 35.0 - 47.0 %   MCV 83.3 80.0 - 100.0 fL   MCH 28.4 26.0 - 34.0 pg   MCHC 34.1 32.0 - 36.0 g/dL   RDW 13.7 11.5 - 14.5 %   Platelets 177 150 - 440 K/uL   Neutrophils Relative % 52 %   Neutro Abs 3.5 1.4 - 6.5 K/uL   Lymphocytes Relative 37 %   Lymphs Abs 2.5 1.0 - 3.6 K/uL   Monocytes Relative 7 %   Monocytes Absolute 0.4 0.2 - 0.9 K/uL   Eosinophils Relative 3 %   Eosinophils Absolute 0.2 0 - 0.7 K/uL   Basophils Relative 1 %   Basophils Absolute 0.0 0 - 0.1 K/uL  Urinalysis complete, with microscopic     Status: Abnormal   Collection Time: 04/28/15 12:08 PM  Result Value Ref Range   Color, Urine AMBER (A) YELLOW   APPearance CLOUDY (A) CLEAR   Glucose, UA NEGATIVE NEGATIVE mg/dL   Bilirubin Urine NEGATIVE NEGATIVE   Ketones, ur NEGATIVE NEGATIVE mg/dL   Specific Gravity, Urine 1.026 1.005 - 1.030   Hgb urine dipstick 1+ (A) NEGATIVE   pH 5.0 5.0 - 8.0   Protein, ur 30 (A) NEGATIVE mg/dL   Nitrite NEGATIVE NEGATIVE   Leukocytes, UA 3+ (A) NEGATIVE   RBC / HPF 6-30 0 - 5 RBC/hpf   WBC, UA TOO NUMEROUS TO COUNT 0 - 5 WBC/hpf   Bacteria, UA RARE (A) NONE SEEN   Squamous Epithelial / LPF TOO NUMEROUS TO COUNT (A) NONE SEEN   Mucous PRESENT   Comprehensive metabolic panel     Status: Abnormal   Collection Time: 04/28/15 12:08 PM  Result Value Ref Range   Sodium 139 135 - 145 mmol/L   Potassium 3.5 3.5 - 5.1 mmol/L   Chloride 102 101 - 111 mmol/L   CO2 28 22 - 32 mmol/L   Glucose, Bld 100 (H) 65 - 99 mg/dL   BUN 11 6 - 20 mg/dL   Creatinine, Ser 1.00 0.44 - 1.00 mg/dL    Calcium 9.1 8.9 - 10.3 mg/dL   Total Protein 7.8 6.5 - 8.1 g/dL   Albumin 4.3 3.5 - 5.0 g/dL   AST 80 (H) 15 - 41 U/L   ALT 43 14 - 54 U/L   Alkaline Phosphatase 75 38 - 126 U/L   Total Bilirubin 0.4 0.3 - 1.2 mg/dL   GFR calc non Af Amer >60 >60 mL/min   GFR calc Af Amer >60 >60 mL/min    Comment: (NOTE) The eGFR has been calculated using the CKD EPI equation. This calculation has not been validated in all clinical situations. eGFR's persistently <60 mL/min signify possible Chronic Kidney Disease.    Anion gap 9 5 - 15  Lipase, blood     Status: None   Collection Time: 04/28/15 12:08 PM  Result Value Ref Range   Lipase 24 22 - 51 U/L  CBC with Differential     Status: None   Collection Time: 04/28/15 12:08 PM  Result Value Ref Range   WBC 6.6 3.6 - 11.0 K/uL   RBC 4.74 3.80 - 5.20 MIL/uL   Hemoglobin 13.6 12.0 - 16.0 g/dL   HCT 40.3 35.0 - 47.0 %   MCV 85.0 80.0 - 100.0 fL   MCH 28.8 26.0 - 34.0 pg   MCHC 33.8 32.0 - 36.0  g/dL   RDW 14.1 11.5 - 14.5 %   Platelets 182 150 - 440 K/uL   Neutrophils Relative % 63 %   Neutro Abs 4.2 1.4 - 6.5 K/uL   Lymphocytes Relative 28 %   Lymphs Abs 1.8 1.0 - 3.6 K/uL   Monocytes Relative 5 %   Monocytes Absolute 0.3 0.2 - 0.9 K/uL   Eosinophils Relative 3 %   Eosinophils Absolute 0.2 0 - 0.7 K/uL   Basophils Relative 1 %   Basophils Absolute 0.0 0 - 0.1 K/uL    Physical Exam  Constitutional: Patient appears well-developed and well-nourished. In no distress.  HEENT:  - Head: Normocephalic and atraumatic.  - Ears: Bilateral TMs gray, no erythema or effusion - Nose: Nasal mucosa moist - Mouth/Throat: Oropharynx is clear and moist. No tonsillar hypertrophy or erythema. No post nasal drainage.  - Eyes: Conjunctivae clear, EOM movements normal. PERRLA. No scleral icterus.  Neck: Normal range of motion. Neck supple. No JVD present. No thyromegaly present.  Cardiovascular: Normal rate, regular rhythm and normal heart sounds.  No  murmur heard.  Pulmonary/Chest: Effort normal and breath sounds normal. No respiratory distress. Abdominal: Soft. Bowel sounds are normal, no distension. There is no tenderness. no masses BREAST: Bilateral breast exam normal with no masses, skin changes or nipple discharge. Right breast tender at 11 o'clock position. FEMALE GENITALIA:  External genitalia normal External urethra normal Vaginal vault normal without discharge or lesions Cervix normal without discharge or lesions Bimanual exam normal without masses RECTAL: External non-bleeding hemorrhoids Musculoskeletal: Normal range of motion bilateral UE and LE, no joint effusions. Peripheral vascular: Bilateral LE no edema. Neurological: CN II-XII grossly intact with no focal deficits. Alert and oriented to person, place, and time. Coordination, balance, strength, speech and gait are normal.  Skin: Skin is warm and dry. No rash noted. No erythema.  Psychiatric: Patient has a stable mood and affect. Behavior is normal in office today. Judgment and thought content normal in office today.   Assessment & Plan  1. Annual physical exam Flu shot for Medicaid population not in stock to administer today.  2. Rectal bleeding Stable clinical findings. Keep upcoming GI appointment.   3. Mass of right breast on mammogram  - MM Digital Diagnostic Unilat R; Future - [name] Unlisted Procedure Breast; Future  4. Cervical cancer screening  - Pap IG w/ reflex to HPV when ASC-U  5. External hemorrhoid

## 2015-05-05 NOTE — Addendum Note (Signed)
Addended by: Bobetta Lime on: 05/05/2015 08:23 AM   Modules accepted: Miquel Dunn

## 2015-05-06 LAB — PAP IG W/ RFLX HPV ASCU: PAP SMEAR COMMENT: 0

## 2015-05-07 ENCOUNTER — Telehealth: Payer: Self-pay | Admitting: Family Medicine

## 2015-05-07 NOTE — Telephone Encounter (Signed)
Patient is checking status on her pap results. [phone]

## 2015-05-07 NOTE — Addendum Note (Signed)
Addended by: Bobetta Lime on: 05/07/2015 04:34 PM   Modules accepted: Miquel Dunn

## 2015-05-07 NOTE — Telephone Encounter (Signed)
Patient was informed.

## 2015-05-07 NOTE — Telephone Encounter (Signed)
Results of pap: negative for abnormal cells. Next PAP in 3 years.

## 2015-05-17 ENCOUNTER — Ambulatory Visit
Admission: RE | Admit: 2015-05-17 | Discharge: 2015-05-17 | Disposition: A | Discharge status: Home / Self Care | Payer: Medicaid Other | Source: Ambulatory Visit | Attending: Family Medicine | Admitting: Family Medicine

## 2015-05-17 ENCOUNTER — Ambulatory Visit (INDEPENDENT_AMBULATORY_CARE_PROVIDER_SITE_OTHER): Payer: Medicaid Other | Admitting: Family Medicine

## 2015-05-17 ENCOUNTER — Ambulatory Visit
Admission: EM | Admit: 2015-05-17 | Discharge: 2015-05-17 | Disposition: A | Discharge status: Home / Self Care | Payer: Medicaid Other | Source: Ambulatory Visit | Attending: Family Medicine | Admitting: Family Medicine

## 2015-05-17 ENCOUNTER — Encounter: Payer: Self-pay | Admitting: Family Medicine

## 2015-05-17 VITALS — BP 120/72 | HR 83 | Temp 97.8°F | Resp 16 | Wt 158.4 lb

## 2015-05-17 DIAGNOSIS — M79672 Pain in left foot: Secondary | ICD-10-CM | POA: Insufficient documentation

## 2015-05-17 MED ORDER — OXYCODONE-ACETAMINOPHEN 5-325 MG PO TABS: 1.0000 | ORAL_TABLET | Freq: Four times a day (QID) | ORAL | Status: DC | PRN

## 2015-05-17 NOTE — Patient Instructions (Signed)
Metatarsal Fracture, Undisplaced  A metatarsal fracture is a break in the bone(s) of the foot. These are the bones of the foot that connect your toes to the bones of the ankle.  DIAGNOSIS   The diagnoses of these fractures are usually made with X-rays. If there are problems in the forefoot and x-rays are normal a later bone scan will usually make the diagnosis.   TREATMENT AND HOME CARE INSTRUCTIONS  · Treatment may or may not include a cast or walking shoe. When casts are needed the use is usually for short periods of time so as not to slow down healing with muscle wasting (atrophy).  · Activities should be stopped until further advised by your caregiver.  · Wear shoes with adequate shock absorbing capabilities and stiff soles.  · Alternative exercise may be undertaken while waiting for healing. These may include bicycling and swimming, or as your caregiver suggests.  · It is important to keep all follow-up visits or specialty referrals. The failure to keep these appointments could result in improper bone healing and chronic pain or disability.  · Warning: Do not drive a car or operate a motor vehicle until your caregiver specifically tells you it is safe to do so.  IF YOU DO NOT HAVE A CAST OR SPLINT:  · You may walk on your injured foot as tolerated or advised.  · Do not put any weight on your injured foot for as long as directed by your caregiver. Slowly increase the amount of time you walk on the foot as the pain allows or as advised.  · Use crutches until you can bear weight without pain. A gradual increase in weight bearing may help.  · Apply ice to the injury for 15-20 minutes each hour while awake for the first 2 days. Put the ice in a plastic bag and place a towel between the bag of ice and your skin.  · Only take over-the-counter or prescription medicines for pain, discomfort, or fever as directed by your caregiver.  SEEK IMMEDIATE MEDICAL CARE IF:   · Your cast gets damaged or breaks.  · You have  continued severe pain or more swelling than you did before the cast was put on, or the pain is not controlled with medications.  · Your skin or nails below the injury turn blue or grey, or feel cold or numb.  · There is a bad smell, or new stains or pus-like (purulent) drainage coming from the cast.  MAKE SURE YOU:   · Understand these instructions.  · Will watch your condition.  · Will get help right away if you are not doing well or get worse.  Document Released: 05/23/2002 Document Revised: 11/23/2011 Document Reviewed: 04/13/2008  [employer]® Patient Information ©2015 [employer]. This information is not intended to replace advice given to you by your health care provider. Make sure you discuss any questions you have with your health care provider.

## 2015-05-17 NOTE — Progress Notes (Signed)
Name: Kristina Reed   MRN: [id number]    DOB: 10-Jan-1966   Date:05/17/2015       Progress Note  Subjective  Chief Complaint  Chief Complaint  Patient presents with  . Foot Pain    left foot pain. patient states that the pain is under her first 3 toes and it throbs very badly. she said that it started about 3-4 days ago.    HPI  Kristina Reed is a 49 year old female who is here today with reports of left foot pain onset 4 days ago with no known injury. She denies items falling onto foot, no falls, no jumping from high spots. Location left foot middle to toes. She denies swelling, fluid accumulation, fevers, calf pain, shortness of breath.  Kristina Reed does have to limp around as the pain at the forefoot is worse. Has been wrapping it with an old ace bandage and elevating it. No NSAIDs taken.    Patient Active Problem List   Diagnosis Date Noted  . Acute pain of left foot 05/17/2015  . Mass of right breast on mammogram 05/03/2015  . Annual physical exam 05/03/2015  . Cervical cancer screening 05/03/2015  . External hemorrhoid 05/03/2015  . Rectal bleeding 04/30/2015  . Nausea and vomiting in adult patient 02/18/2015  . Bursitis of knee 07/23/2014  . Anxiety and depression 02/02/2014  . Bladder dysfunction 02/02/2014  . Acid reflux 02/02/2014  . Adult hypothyroidism 02/02/2014  . Obstructive apnea 02/02/2014  . Incomplete bladder emptying 12/12/2012  . Tenosynovitis of foot 05/30/2012  . Benign neoplasm of kidney 05/13/2012  . Symptoms involving urinary system 05/13/2012  . Urge incontinence 05/13/2012  . FOM (frequency of micturition) 05/13/2012  . Extremity pain 05/05/2012  . Tarsal tunnel syndrome 03/03/2012    Social History  Substance Use Topics  . Smoking status: Never Smoker   . Smokeless tobacco: Not on file  . Alcohol Use: No     Current outpatient prescriptions:  .  polyethylene glycol-electrolytes (NULYTELY/GOLYTELY) 420 G solution, Take by mouth., Disp: , Rfl:  .   docusate sodium (STOOL SOFTENER) 100 MG capsule, Take 100 mg by mouth., Disp: , Rfl:  .  FLUoxetine (PROZAC) 20 MG capsule, Take 1 capsule (20 mg total) by mouth daily., Disp: 30 capsule, Rfl: 5 .  hydrocortisone (ANUSOL-HC) 2.5 % rectal cream, Place 1 application rectally 2 (two) times daily., Disp: 30 g, Rfl: 0 .  levothyroxine (SYNTHROID, LEVOTHROID) 50 MCG tablet, Take by mouth., Disp: , Rfl:  .  LORazepam (ATIVAN) 0.5 MG tablet, Take 1 tablet (0.5 mg total) by mouth 2 (two) times daily as needed for anxiety (Nausea)., Disp: 40 tablet, Rfl: 5 .  meloxicam (MOBIC) 15 MG tablet, Take 15 mg by mouth., Disp: , Rfl:  .  metoCLOPramide (REGLAN) 10 MG tablet, Take 1 tablet (10 mg total) by mouth every 6 (six) hours as needed for nausea., Disp: 100 tablet, Rfl: 5 .  omeprazole (PRILOSEC) 40 MG capsule, Take by mouth., Disp: , Rfl:  .  ondansetron (ZOFRAN) 4 MG tablet, Take by mouth., Disp: , Rfl:  .  oxyCODONE-acetaminophen (ROXICET) 5-325 MG per tablet, Take 1 tablet by mouth every 6 (six) hours as needed for severe pain., Disp: 15 tablet, Rfl: 0 .  promethazine (PHENERGAN) 25 MG suppository, Place 25 mg rectally every 6 (six) hours as needed for nausea or vomiting., Disp: , Rfl:  .  sucralfate (CARAFATE) 1 GM/10ML suspension, Take by mouth., Disp: , Rfl:  .  traMADol (  ULTRAM) 50 MG tablet, Take by mouth every 6 (six) hours as needed., Disp: , Rfl:  .  traZODone (DESYREL) 50 MG tablet, Take 50 mg by mouth., Disp: , Rfl:   Past Surgical History  Procedure Laterality Date  . Abdominal hysterectomy      partial hysterectomy    Family History  Problem Relation Age of Onset  . Diabetes Mother     Allergies  Allergen Reactions  . Percocet [Oxycodone-Acetaminophen] Palpitations     Review of Systems  CONSTITUTIONAL: No significant weight changes, fever, chills, weakness or fatigue.  HEENT:  - Eyes: No visual changes.  - Ears: No auditory changes. No pain.  - Nose: No sneezing, congestion,  runny nose. - Throat: No sore throat. No changes in swallowing. SKIN: No rash or itching.  CARDIOVASCULAR: No chest pain, chest pressure or chest discomfort. No palpitations or edema.  RESPIRATORY: No shortness of breath, cough or sputum.  GASTROINTESTINAL: No anorexia. Chronic intermittent nausea with vomiting. No changes in bowel habits. No abdominal pain or blood.  GENITOURINARY: No dysuria. No frequency. No discharge. NEUROLOGICAL: No headache, dizziness, syncope, paralysis, ataxia, numbness or tingling in the extremities. No memory changes. No change in bowel or bladder control.  MUSCULOSKELETAL: Yes left foot joint pain. No muscle pain. HEMATOLOGIC: No anemia, bleeding or bruising.  LYMPHATICS: No enlarged lymph nodes.  PSYCHIATRIC: No change in mood. No change in sleep pattern.  ENDOCRINOLOGIC: No reports of sweating, cold or heat intolerance. No polyuria or polydipsia.     Objective  BP 120/72 mmHg  Pulse 83  Temp(Src) 97.8 F (36.6 C) (Oral)  Resp 16  Wt 158 lb 6.4 oz (71.85 kg)  SpO2 98%  LMP 05/09/1993 (Approximate) Body mass index is 29.94 kg/(m^2).  Physical Exam  Constitutional: Patient appears well-developed and well-nourished. In no distress.  Cardiovascular: Normal rate, regular rhythm and normal heart sounds.  No murmur heard.  Pulmonary/Chest: Effort normal and breath sounds normal. No respiratory distress. Musculoskeletal: Normal range of motion bilateral UE and LE, no joint effusions. Left foot compared to right foot blue grey color from midfoot to distal toes mostly digits 2-4. No redness, no warmth. In fact foot is cool to the touch. Pedal pulse 2+ bilaterally. Right foot unremarkable. Negative holman's sign bilaterally. Peripheral vascular: Bilateral LE trace edema up to mid shins. Neurological: CN II-XII grossly intact with no focal deficits. Alert and oriented to person, place, and time.  Skin: Skin is warm and dry. No rash noted. No erythema.   Psychiatric: Patient has a normal mood and affect. Behavior is normal in office today. Judgment and thought content normal in office today.   Assessment & Plan  1. Acute pain of left foot Suspect fracture but I have kept in mind circulatory dysfunction such as acute thrombosis. Will get X-ray to investigate for fracture and she has been given ER precautions if symptoms worsen as it is a Friday after noon and a 3 day long weekend I will not be able to get her in with a specialist until next week.  - oxyCODONE-acetaminophen (ROXICET) 5-325 MG per tablet; Take 1 tablet by mouth every 6 (six) hours as needed for severe pain.  Dispense: 15 tablet; Refill: 0 - DG Foot Complete Left; Future

## 2015-05-17 NOTE — Addendum Note (Signed)
Addended by: Bobetta Lime on: 05/17/2015 02:37 PM   Modules accepted: Orders

## 2015-05-24 ENCOUNTER — Ambulatory Visit (INDEPENDENT_AMBULATORY_CARE_PROVIDER_SITE_OTHER): Payer: Medicaid Other | Admitting: Family Medicine

## 2015-05-24 ENCOUNTER — Encounter: Payer: Self-pay | Admitting: Family Medicine

## 2015-05-24 VITALS — BP 132/88 | HR 71 | Temp 97.8°F | Resp 16 | Ht 61.0 in | Wt 157.3 lb

## 2015-05-24 DIAGNOSIS — Z23 Encounter for immunization: Secondary | ICD-10-CM

## 2015-05-24 DIAGNOSIS — M79672 Pain in left foot: Secondary | ICD-10-CM

## 2015-05-24 NOTE — Progress Notes (Signed)
Name: Kristina Reed   MRN: [id number]    DOB: 1966-01-15   Date:05/24/2015       Progress Note  Subjective  Chief Complaint  Chief Complaint  Patient presents with  . Foot Pain    left foot pain    HPI  Kristina Reed is a 49 year old female who is here today for follow up of of left foot pain onset 10 days ago with no known injury. She denies items falling onto foot, no falls, no jumping from high spots. Location left foot middle to toes. She denies swelling, fluid accumulation, fevers, calf pain, shortness of breath. Kristina Reed does have to limp around as the pain at the forefoot is worse. Has been wrapping it with a ace bandage and elevating it. No NSAIDs taken. X-ray done last week was normal.  Patient Active Problem List   Diagnosis Date Noted  . Acute pain of left foot 05/17/2015  . Mass of right breast on mammogram 05/03/2015  . Annual physical exam 05/03/2015  . Cervical cancer screening 05/03/2015  . External hemorrhoid 05/03/2015  . Rectal bleeding 04/30/2015  . Nausea and vomiting in adult patient 02/18/2015  . Bursitis of knee 07/23/2014  . Anxiety and depression 02/02/2014  . Bladder dysfunction 02/02/2014  . Acid reflux 02/02/2014  . Adult hypothyroidism 02/02/2014  . Obstructive apnea 02/02/2014  . Incomplete bladder emptying 12/12/2012  . Tenosynovitis of foot 05/30/2012  . Benign neoplasm of kidney 05/13/2012  . Symptoms involving urinary system 05/13/2012  . Urge incontinence 05/13/2012  . FOM (frequency of micturition) 05/13/2012  . Extremity pain 05/05/2012  . Tarsal tunnel syndrome 03/03/2012    Social History  Substance Use Topics  . Smoking status: Never Smoker   . Smokeless tobacco: Not on file  . Alcohol Use: No     Current outpatient prescriptions:  .  docusate sodium (STOOL SOFTENER) 100 MG capsule, Take 100 mg by mouth., Disp: , Rfl:  .  FLUoxetine (PROZAC) 20 MG capsule, Take 1 capsule (20 mg total) by mouth daily., Disp: 30 capsule, Rfl: 5 .   hydrocortisone (ANUSOL-HC) 2.5 % rectal cream, Place 1 application rectally 2 (two) times daily., Disp: 30 g, Rfl: 0 .  levothyroxine (SYNTHROID, LEVOTHROID) 50 MCG tablet, Take by mouth., Disp: , Rfl:  .  LORazepam (ATIVAN) 0.5 MG tablet, Take 1 tablet (0.5 mg total) by mouth 2 (two) times daily as needed for anxiety (Nausea)., Disp: 40 tablet, Rfl: 5 .  meloxicam (MOBIC) 15 MG tablet, Take 15 mg by mouth., Disp: , Rfl:  .  metoCLOPramide (REGLAN) 10 MG tablet, Take 1 tablet (10 mg total) by mouth every 6 (six) hours as needed for nausea., Disp: 100 tablet, Rfl: 5 .  omeprazole (PRILOSEC) 40 MG capsule, Take by mouth., Disp: , Rfl:  .  ondansetron (ZOFRAN) 4 MG tablet, Take by mouth., Disp: , Rfl:  .  oxyCODONE-acetaminophen (ROXICET) 5-325 MG per tablet, Take 1 tablet by mouth every 6 (six) hours as needed for severe pain., Disp: 15 tablet, Rfl: 0 .  polyethylene glycol-electrolytes (NULYTELY/GOLYTELY) 420 G solution, Take by mouth., Disp: , Rfl:  .  promethazine (PHENERGAN) 25 MG suppository, Place 25 mg rectally every 6 (six) hours as needed for nausea or vomiting., Disp: , Rfl:  .  sucralfate (CARAFATE) 1 GM/10ML suspension, Take by mouth., Disp: , Rfl:  .  traMADol (ULTRAM) 50 MG tablet, Take by mouth every 6 (six) hours as needed., Disp: , Rfl:  .  traZODone (DESYREL)  50 MG tablet, Take 50 mg by mouth., Disp: , Rfl:   Past Surgical History  Procedure Laterality Date  . Abdominal hysterectomy      partial hysterectomy    Family History  Problem Relation Age of Onset  . Diabetes Mother     Allergies  Allergen Reactions  . Percocet [Oxycodone-Acetaminophen] Palpitations     Review of Systems  CONSTITUTIONAL: No significant weight changes, fever, chills, weakness or fatigue.  HEENT:  - Eyes: No visual changes.  - Ears: No auditory changes. No pain.  - Nose: No sneezing, congestion, runny nose. - Throat: No sore throat. No changes in swallowing. SKIN: No rash or itching.   CARDIOVASCULAR: No chest pain, chest pressure or chest discomfort. No palpitations or edema.  RESPIRATORY: No shortness of breath, cough or sputum.  GASTROINTESTINAL: No anorexia, nausea, vomiting. No changes in bowel habits. No abdominal pain or blood.  GENITOURINARY: No dysuria. No frequency. No discharge.  NEUROLOGICAL: No headache, dizziness, syncope, paralysis, ataxia, numbness or tingling in the extremities. No memory changes. No change in bowel or bladder control.  MUSCULOSKELETAL: Yes foot joint pain. No muscle pain. HEMATOLOGIC: No anemia, bleeding or bruising.  LYMPHATICS: No enlarged lymph nodes.  PSYCHIATRIC: No change in mood. No change in sleep pattern.  ENDOCRINOLOGIC: No reports of sweating, cold or heat intolerance. No polyuria or polydipsia.     Objective  BP 132/88 mmHg  Pulse 71  Temp(Src) 97.8 F (36.6 C)  Resp 16  Ht 5\' 1"  (1.549 m)  Wt 157 lb 5 oz (71.356 kg)  BMI 29.74 kg/m2  SpO2 96%  LMP 05/09/1993 (Approximate) Body mass index is 29.74 kg/(m^2).  Physical Exam  Constitutional: Patient appears well-developed and well-nourished. In no distress.  Cardiovascular: Normal rate, regular rhythm and normal heart sounds. No murmur heard.  Pulmonary/Chest: Effort normal and breath sounds normal. No respiratory distress. Musculoskeletal: Normal range of motion bilateral UE and LE, no joint effusions. Left foot compared to right foot RESOLVED blue grey color from midfoot to distal toes mostly digits 2-4. No redness, no warmth. Left foot warm with pedal pulse 2+ bilaterally. Right foot unremarkable. Negative holman's sign bilaterally. Peripheral vascular: No edema bilateral LE.  Neurological: CN II-XII grossly intact with no focal deficits. Alert and oriented to person, place, and time.  Skin: Skin is warm and dry. No rash noted. No erythema.  Psychiatric: Patient has a normal mood and affect. Behavior is normal in office today. Judgment and thought content  normal in office today.   Assessment & Plan   1. Acute pain of left foot Clinically improved picture. Will refer to podiatry due to ongoing pain.  - Ambulatory referral to Podiatry  2. Need for influenza vaccination  - Flu Vaccine QUAD 36+ mos PF IM (Fluarix & Fluzone Quad PF)

## 2015-06-03 ENCOUNTER — Ambulatory Visit
Admission: RE | Admit: 2015-06-03 | Discharge: 2015-06-03 | Disposition: A | Discharge status: Home / Self Care | Payer: Medicaid Other | Source: Ambulatory Visit | Attending: Family Medicine | Admitting: Family Medicine

## 2015-06-03 DIAGNOSIS — N6310 Unspecified lump in the right breast, unspecified quadrant: Secondary | ICD-10-CM

## 2015-06-03 DIAGNOSIS — N63 Unspecified lump in breast: Secondary | ICD-10-CM | POA: Diagnosis not present

## 2015-06-05 ENCOUNTER — Ambulatory Visit (INDEPENDENT_AMBULATORY_CARE_PROVIDER_SITE_OTHER): Payer: Medicaid Other | Admitting: Podiatry

## 2015-06-05 ENCOUNTER — Telehealth: Payer: Self-pay | Admitting: *Deleted

## 2015-06-05 ENCOUNTER — Encounter: Payer: Self-pay | Admitting: Podiatry

## 2015-06-05 ENCOUNTER — Ambulatory Visit: Payer: Self-pay

## 2015-06-05 VITALS — BP 122/73 | HR 68 | Resp 16 | Ht 61.0 in | Wt 157.0 lb

## 2015-06-05 DIAGNOSIS — M79673 Pain in unspecified foot: Secondary | ICD-10-CM

## 2015-06-05 DIAGNOSIS — M722 Plantar fascial fibromatosis: Secondary | ICD-10-CM

## 2015-06-05 DIAGNOSIS — G5762 Lesion of plantar nerve, left lower limb: Secondary | ICD-10-CM | POA: Diagnosis not present

## 2015-06-05 NOTE — Progress Notes (Signed)
   Subjective:    Patient ID: Kristina Reed, female    DOB: [birth year], 49 y.o.   MRN: [id number]  HPI: She presents up of 49 year old female chief complaint pain to the plantar lateral arch on the dorsolateral foot. This been going on for proximally one month now and has seen her primary care Luberta Grabinski who prescribed her oxycodone. She states that the pain radiates from the toes proximally.    Review of Systems  Gastrointestinal: Positive for blood in stool.  Neurological: Positive for headaches.  All other systems reviewed and are negative.      Objective:   Physical Exam: I have reviewed her past medical history medications allergies surgery social history. She presents 49 year old and in good disposition with no acute distress. Pulses are strongly palpable bilateral. Neurologic sensorium is intact her Semmes-Weinstein monofilament. Deep tendon reflexes are intact bilateral and muscle strength is 5 over 5 dorsiflexion plantar flexors and inverters everters all intrinsic musculature is intact. Orthopedic evaluation demonstrates all joints distal to the ankle full range of motion without crepitation. She has pain on palpation with palpable Mulder's click third interdigital space of the left foot. Radiographs which were in the EPIC system were reviewed and demonstrated no osseous abnormalities. Cutaneous evaluation demonstrates supple well-hydrated cutis no erythema edema saline as drainage or odor.        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  Plan: Injected Kenalog and local and aesthetic to the point of maximal tenderness third interdigital space left foot. Follow up with her in 1 month.

## 2015-06-05 NOTE — Patient Instructions (Signed)

## 2015-06-05 NOTE — Telephone Encounter (Signed)
Pt states she received injection in her foot today, and the left 4th toe is now numb, is that normal.  I left a message informing pt with that type of injection it was not unusual, that it could vary in degrees of numbness at different times and may gradually fade.  I left a message because the pt sounded scared and I left my number.

## 2015-06-26 ENCOUNTER — Encounter: Payer: Self-pay | Admitting: *Deleted

## 2015-06-27 ENCOUNTER — Ambulatory Visit
Admission: RE | Admit: 2015-06-27 | Discharge: 2015-06-27 | Disposition: A | Discharge status: Home / Self Care | Payer: Medicaid Other | Source: Ambulatory Visit | Attending: Gastroenterology | Admitting: Gastroenterology

## 2015-06-27 ENCOUNTER — Ambulatory Visit: Payer: Medicaid Other | Admitting: Anesthesiology

## 2015-06-27 ENCOUNTER — Encounter
Admission: RE | Disposition: A | Discharge status: Home / Self Care | Payer: Self-pay | Source: Ambulatory Visit | Attending: Gastroenterology

## 2015-06-27 ENCOUNTER — Encounter: Payer: Self-pay | Admitting: *Deleted

## 2015-06-27 DIAGNOSIS — K219 Gastro-esophageal reflux disease without esophagitis: Secondary | ICD-10-CM | POA: Insufficient documentation

## 2015-06-27 DIAGNOSIS — E039 Hypothyroidism, unspecified: Secondary | ICD-10-CM | POA: Insufficient documentation

## 2015-06-27 DIAGNOSIS — G4730 Sleep apnea, unspecified: Secondary | ICD-10-CM | POA: Diagnosis not present

## 2015-06-27 DIAGNOSIS — Z8541 Personal history of malignant neoplasm of cervix uteri: Secondary | ICD-10-CM | POA: Insufficient documentation

## 2015-06-27 DIAGNOSIS — Z885 Allergy status to narcotic agent status: Secondary | ICD-10-CM | POA: Insufficient documentation

## 2015-06-27 DIAGNOSIS — K921 Melena: Secondary | ICD-10-CM | POA: Diagnosis present

## 2015-06-27 DIAGNOSIS — M1990 Unspecified osteoarthritis, unspecified site: Secondary | ICD-10-CM | POA: Insufficient documentation

## 2015-06-27 DIAGNOSIS — K640 First degree hemorrhoids: Secondary | ICD-10-CM | POA: Diagnosis not present

## 2015-06-27 DIAGNOSIS — K5730 Diverticulosis of large intestine without perforation or abscess without bleeding: Secondary | ICD-10-CM | POA: Insufficient documentation

## 2015-06-27 DIAGNOSIS — K644 Residual hemorrhoidal skin tags: Secondary | ICD-10-CM | POA: Insufficient documentation

## 2015-06-27 DIAGNOSIS — G709 Myoneural disorder, unspecified: Secondary | ICD-10-CM | POA: Insufficient documentation

## 2015-06-27 HISTORY — PX: COLONOSCOPY WITH PROPOFOL: SHX5780

## 2015-06-27 SURGERY — COLONOSCOPY WITH PROPOFOL
Anesthesia: General

## 2015-06-27 MED ORDER — FENTANYL CITRATE (PF) 100 MCG/2ML IJ SOLN
INTRAMUSCULAR | Status: DC | PRN
  Administered 2015-06-27: 50 ug via INTRAVENOUS

## 2015-06-27 MED ORDER — SODIUM CHLORIDE 0.9 % IV SOLN
INTRAVENOUS | Status: DC
  Administered 2015-06-27: 10:00:00 via INTRAVENOUS

## 2015-06-27 MED ORDER — MIDAZOLAM HCL 2 MG/2ML IJ SOLN
INTRAMUSCULAR | Status: DC | PRN
  Administered 2015-06-27: 1 mg via INTRAVENOUS

## 2015-06-27 MED ORDER — PROPOFOL 500 MG/50ML IV EMUL
INTRAVENOUS | Status: DC | PRN
  Administered 2015-06-27: 120 ug/kg/min via INTRAVENOUS

## 2015-06-27 NOTE — Discharge Instructions (Signed)

## 2015-06-27 NOTE — Anesthesia Postprocedure Evaluation (Signed)
  Anesthesia Post-op Note  Patient: Kristina Reed  Procedure(s) Performed: Procedure(s): COLONOSCOPY WITH PROPOFOL (N/A)  Anesthesia type:General  Patient location: PACU  Post pain: Pain level controlled  Post assessment: Post-op Vital signs reviewed, Patient's Cardiovascular Status Stable, Respiratory Function Stable, Patent Airway and No signs of Nausea or vomiting  Post vital signs: Reviewed and stable  Last Vitals:  Filed Vitals:   06/27/15 1200  BP: 127/95  Pulse: 53  Temp:   Resp: 11    Level of consciousness: awake, alert  and patient cooperative  Complications: No apparent anesthesia complications

## 2015-06-27 NOTE — H&P (Signed)
Primary Care Physician:  Bobetta Lime, MD  Pre-Procedure History & Physical: HPI:  Kristina Reed is a 49 y.o. female is here for an colonoscopy.   Past Medical History  Diagnosis Date  . Thyroid disease   . Arthritis     right knee and right elbow  . Gallstones   . Vaginal inclusion cyst   . Cancer ([place])     Cervical CA with partial hysterectomy.  . Status post partial hysterectomy     Due to Cervical CA  . GERD (gastroesophageal reflux disease)     Past Surgical History  Procedure Laterality Date  . Abdominal hysterectomy      partial hysterectomy    Prior to Admission medications   Medication Sig Start Date End Date Taking? Authorizing Provider  docusate sodium (STOOL SOFTENER) 100 MG capsule Take 100 mg by mouth. 01/22/12  Yes Historical Provider, MD  FLUoxetine (PROZAC) 20 MG capsule Take 1 capsule (20 mg total) by mouth daily. 04/22/15  Yes Bobetta Lime, MD  hydrocortisone (ANUSOL-HC) 2.5 % rectal cream Place 1 application rectally 2 (two) times daily. 04/30/15  Yes Bobetta Lime, MD  levothyroxine (SYNTHROID, LEVOTHROID) 50 MCG tablet Take by mouth.   Yes Historical Provider, MD  LORazepam (ATIVAN) 0.5 MG tablet Take 1 tablet (0.5 mg total) by mouth 2 (two) times daily as needed for anxiety (Nausea). 04/22/15 04/21/16 Yes Bobetta Lime, MD  meloxicam (MOBIC) 15 MG tablet Take 15 mg by mouth.   Yes Historical Provider, MD  metoCLOPramide (REGLAN) 10 MG tablet Take 1 tablet (10 mg total) by mouth every 6 (six) hours as needed for nausea. 04/22/15 04/21/16 Yes Bobetta Lime, MD  omeprazole (PRILOSEC) 40 MG capsule Take by mouth. 02/21/14  Yes Historical Provider, MD  ondansetron (ZOFRAN) 4 MG tablet Take by mouth.   Yes Historical Provider, MD  oxyCODONE-acetaminophen (ROXICET) 5-325 MG per tablet Take 1 tablet by mouth every 6 (six) hours as needed for severe pain. 05/17/15  Yes Bobetta Lime, MD  polyethylene glycol-electrolytes (NULYTELY/GOLYTELY) 420 G solution Take by  mouth. 05/09/15  Yes Historical Provider, MD  promethazine (PHENERGAN) 25 MG suppository Place 25 mg rectally every 6 (six) hours as needed for nausea or vomiting.   Yes Historical Provider, MD  sucralfate (CARAFATE) 1 GM/10ML suspension Take by mouth.   Yes Historical Provider, MD  traMADol (ULTRAM) 50 MG tablet Take by mouth every 6 (six) hours as needed.   Yes Historical Provider, MD  traZODone (DESYREL) 50 MG tablet Take 50 mg by mouth.   Yes Historical Provider, MD    Allergies as of 05/10/2015 - Review Complete 05/03/2015  Allergen Reaction Noted  . Percocet [oxycodone-acetaminophen] Palpitations 01/20/2015    Family History  Problem Relation Age of Onset  . Diabetes Mother     Social History   Social History  . Marital Status: Significant Other    Spouse Name: N/A  . Number of Children: N/A  . Years of Education: N/A   Occupational History  . Not on file.   Social History Main Topics  . Smoking status: Never Smoker   . Smokeless tobacco: Never Used  . Alcohol Use: No  . Drug Use: No  . Sexual Activity: Not on file   Other Topics Concern  . Not on file   Social History Narrative     Physical Exam: BP 134/87 mmHg  Pulse 64  Temp(Src) 96.9 F (36.1 C) (Tympanic)  Resp 20  Ht 5\' 1"  (1.549 m)  Wt 70.308  kg (155 lb)  BMI 29.30 kg/m2  SpO2 100%  LMP 05/09/1993 (Approximate) General:   Alert,  pleasant and cooperative in NAD Head:  Normocephalic and atraumatic. Neck:  Supple; no masses or thyromegaly. Lungs:  Clear throughout to auscultation.    Heart:  Regular rate and rhythm. Abdomen:  Soft, nontender and nondistended. Normal bowel sounds, without guarding, and without rebound.   Neurologic:  Alert and  oriented x4;  grossly normal neurologically.  Impression/Plan: Kristina Reed is here for an colonoscopy to be performed for rectal bleeding  Risks, benefits, limitations, and alternatives regarding  colonoscopy have been reviewed with the patient.   Questions have been answered.  All parties agreeable.   Josefine Class, MD  06/27/2015, 10:43 AM

## 2015-06-27 NOTE — Anesthesia Procedure Notes (Signed)
Performed by: COOK-MARTIN, Hula Tasso Pre-anesthesia Checklist: Patient identified, Emergency Drugs available, Suction available, Patient being monitored and Timeout performed Patient Re-evaluated:Patient Re-evaluated prior to inductionOxygen Delivery Method: Nasal cannula Preoxygenation: Pre-oxygenation with 100% oxygen Intubation Type: IV induction Placement Confirmation: positive ETCO2 and CO2 detector       

## 2015-06-27 NOTE — Op Note (Signed)
[facility] Gastroenterology Patient Name: Kristina Reed Procedure Date: 06/27/2015 10:44 AM MRN: [id number] Account #: [id number] Date of Birth: 06/28/1966 Admit Type: Outpatient Age: 49 Room: [facility] ENDO ROOM 3 Gender: Female Note Status: Finalized Procedure:         Colonoscopy Indications:       This is the patient's first colonoscopy, Hematochezia Patient Profile:   This is a 49 year old female. Providers:         Gerrit Heck. [name], MD Referring MD:      Bobetta Lime (Referring MD) Complications:     No immediate complications. Procedure:         Pre-Anesthesia Assessment:                    - Prior to the procedure, a History and Physical was                     performed, and patient medications, allergies and                     sensitivities were reviewed. The patient's tolerance of                     previous anesthesia was reviewed.                    - Prior to the procedure, a History and Physical was                     performed, and patient medications, allergies and                     sensitivities were reviewed. The patient's tolerance of                     previous anesthesia was reviewed.                    After obtaining informed consent, the colonoscope was                     passed under direct vision. Throughout the procedure, the                     patient's blood pressure, pulse, and oxygen saturations                     were monitored continuously. The Colonoscope was                     introduced through the anus and advanced to the the                     terminal ileum. The colonoscopy was performed without                     difficulty. The patient tolerated the procedure well. The                     quality of the bowel preparation was excellent. Findings:      The perianal exam findings include non-thrombosed external hemorrhoids.      A few small and large-mouthed diverticula were found in the sigmoid       colon.  Internal hemorrhoids were found during retroflexion. The hemorrhoids       were Grade  I (internal hemorrhoids that do not prolapse).      The terminal ileum appeared normal.      The exam was otherwise without abnormality. Impression:        - Non-thrombosed external hemorrhoids found on perianal                     exam.                    - Diverticulosis in the sigmoid colon.                    - Internal hemorrhoids.                    - The examined portion of the ileum was normal.                    - The examination was otherwise normal.                    - No specimens collected. Recommendation:    - Observe patient in GI recovery unit.                    - High fiber diet.                    - Continue present medications.                    - Repeat colonoscopy in 10 years for screening purposes.                    - Return to GI clinic.                    - The findings and recommendations were discussed with the                     patient.                    - The findings and recommendations were discussed with the                     designated responsible adult. Procedure Code(s): --- Professional ---                    [id number], Colonoscopy, flexible; diagnostic, including                     collection of specimen(s) by brushing or washing, when                     performed (separate procedure) CPT copyright 2014 [employer]. All rights reserved. The codes documented in this report are preliminary and upon coder review may  be revised to meet current compliance requirements. Mellody Life, MD 06/27/2015 11:11:37 AM This report has been signed electronically. Number of Addenda: 0 Note Initiated On: 06/27/2015 10:44 AM Scope Withdrawal Time: 0 hours 9 minutes 47 seconds  Total Procedure Duration: 0 hours 13 minutes 12 seconds       [facility]

## 2015-06-27 NOTE — Transfer of Care (Signed)
Immediate Anesthesia Transfer of Care Note  Patient: Kristina Reed  Procedure(s) Performed: Procedure(s): COLONOSCOPY WITH PROPOFOL (N/A)  Patient Location: PACU  Anesthesia Type:General  Level of Consciousness: awake, alert  and sedated  Airway & Oxygen Therapy: Patient Spontanous Breathing and Patient connected to nasal cannula oxygen  Post-op Assessment: Report given to RN and Post -op Vital signs reviewed and stable  Post vital signs: Reviewed and stable  Last Vitals:  Filed Vitals:   06/27/15 0933  BP: 134/87  Pulse: 64  Temp: 36.1 C  Resp: 20    Complications: No apparent anesthesia complications

## 2015-06-27 NOTE — Anesthesia Preprocedure Evaluation (Signed)
Anesthesia Evaluation  Patient identified by MRN, date of birth, ID band Patient awake    Reviewed: Allergy & Precautions, H&P , NPO status , Patient's Chart, lab work & pertinent test results  History of Anesthesia Complications Negative for: history of anesthetic complications  Airway Mallampati: II  TM Distance: >3 FB Neck ROM: full    Dental  (+) Poor Dentition, Missing, Edentulous Lower, Edentulous Upper   Pulmonary sleep apnea ,    Pulmonary exam normal breath sounds clear to auscultation       Cardiovascular Exercise Tolerance: Good Normal cardiovascular exam Rhythm:regular Rate:Normal     Neuro/Psych PSYCHIATRIC DISORDERS  Neuromuscular disease    GI/Hepatic Neg liver ROS, GERD  Controlled,  Endo/Other  Hypothyroidism   Renal/GU Renal disease  negative genitourinary   Musculoskeletal  (+) Arthritis ,   Abdominal   Peds  Hematology negative hematology ROS (+)   Anesthesia Other Findings Past Medical History:   Thyroid disease                                              Arthritis                                                      Comment:right knee and right elbow   Gallstones                                                   Vaginal inclusion cyst                                       Cancer ([place])                                                   Comment:Cervical CA with partial hysterectomy.   Status post partial hysterectomy                               Comment:Due to Cervical CA   GERD (gastroesophageal reflux disease)                      Past Surgical History:   ABDOMINAL HYSTERECTOMY                                          Comment:partial hysterectomy  BMI    Body Mass Index   29.30 kg/m 2      Reproductive/Obstetrics negative OB ROS                             Anesthesia Physical Anesthesia Plan  ASA: III  Anesthesia Plan: General  Post-op Pain  Management:    Induction:   Airway Management Planned:   Additional Equipment:   Intra-op Plan:   Post-operative Plan:   Informed Consent: I have reviewed the patients History and Physical, chart, labs and discussed the procedure including the risks, benefits and alternatives for the proposed anesthesia with the patient or authorized representative who has indicated his/her understanding and acceptance.   Dental Advisory Given  Plan Discussed with: Anesthesiologist, CRNA and Surgeon  Anesthesia Plan Comments:         Anesthesia Quick Evaluation

## 2015-07-03 ENCOUNTER — Encounter: Payer: Self-pay | Admitting: Gastroenterology

## 2015-07-08 ENCOUNTER — Encounter: Payer: Self-pay | Admitting: Family Medicine

## 2015-07-08 ENCOUNTER — Ambulatory Visit
Admission: RE | Admit: 2015-07-08 | Discharge: 2015-07-08 | Disposition: A | Discharge status: Home / Self Care | Payer: Medicaid Other | Source: Ambulatory Visit | Attending: Family Medicine | Admitting: Family Medicine

## 2015-07-08 ENCOUNTER — Ambulatory Visit (INDEPENDENT_AMBULATORY_CARE_PROVIDER_SITE_OTHER): Payer: Medicaid Other | Admitting: Family Medicine

## 2015-07-08 VITALS — BP 108/76 | HR 85 | Temp 98.2°F | Resp 16 | Wt 157.2 lb

## 2015-07-08 DIAGNOSIS — M25561 Pain in right knee: Secondary | ICD-10-CM

## 2015-07-08 DIAGNOSIS — K625 Hemorrhage of anus and rectum: Secondary | ICD-10-CM

## 2015-07-08 MED ORDER — TRAMADOL HCL 50 MG PO TABS: 50.0000 mg | ORAL_TABLET | Freq: Three times a day (TID) | ORAL | Status: DC | PRN

## 2015-07-08 NOTE — Progress Notes (Signed)
Name: Kristina Reed   MRN: [id number]    DOB: [birth year]   Date:07/08/2015       Progress Note  Subjective  Chief Complaint  Chief Complaint  Patient presents with  . Knee Pain    patient presents today with right knee pain. patient has tried ice, heat and elevation, but it has not helped. patient stated that she has used crutches and a cane to keep her from falling because it gives out at times.     HPI  The patient complaints of ongoing issues with rectal bleeding intermittently since having her colonoscopy. Only occurs with BMs, not profuse or prolonged, about teaspoon of blood per BM. Not associated with abdominal pain. 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 Received fax from [facility] stating she needs a referral ASAP back to Dr. Rochel Reed regarding internal hemorrhoid bleeding.   Otherwise [name] complains of right knee pain on and off for some months. She previously also complained of foot pain and was referred to podiatry. She has tried ice, head, elevation and this has not help alleviate her symptoms. She has had to use crutches to walk and feels there is laxity and instability in the knee. No known trauma to knee. Pain is described as sharp and achy over the anterior aspect of knee without swelling or redness. She has had left knee ligament tear which was repaired some years ago by Dr. Sabra Reed, [facility]. She states she does not have any money to go back to ortho at this time. She is also not using Meloxicam, tramadol or percocet previously prescribed as she has ran out.  Past Medical History  Diagnosis Date  . Thyroid disease   . Arthritis     right knee and right elbow  . Gallstones   . Vaginal inclusion cyst   . Cancer ([place])     Cervical CA with partial hysterectomy.  . Status post partial hysterectomy     Due to Cervical CA  . GERD (gastroesophageal reflux disease)     Patient Active Problem List   Diagnosis  Date Noted  . Need for influenza vaccination 05/24/2015  . Acute pain of left foot 05/17/2015  . Mass of right breast on mammogram 05/03/2015  . Annual physical exam 05/03/2015  . Cervical cancer screening 05/03/2015  . External hemorrhoid 05/03/2015  . Rectal bleeding 04/30/2015  . Nausea and vomiting in adult patient 02/18/2015  . Right knee pain 07/23/2014  . Anxiety and depression 02/02/2014  . Bladder dysfunction 02/02/2014  . Acid reflux 02/02/2014  . Adult hypothyroidism 02/02/2014  . Obstructive apnea 02/02/2014  . Incomplete bladder emptying 12/12/2012  . Tenosynovitis of foot 05/30/2012  . Benign neoplasm of kidney 05/13/2012  . Symptoms involving urinary system 05/13/2012  . Urge incontinence 05/13/2012  . FOM (frequency of micturition) 05/13/2012  . Extremity pain 05/05/2012  . Tarsal tunnel syndrome 03/03/2012    Social History  Substance Use Topics  . Smoking status: Never Smoker   . Smokeless tobacco: Never Used  . Alcohol Use: No     Current outpatient prescriptions:  .  docusate sodium (STOOL SOFTENER) 100 MG capsule, Take 100 mg by mouth., Disp: , Rfl:  .  FLUoxetine (PROZAC) 20 MG capsule, Take 1 capsule (20 mg total) by mouth daily., Disp: 30 capsule, Rfl: 5 .  hydrocortisone (ANUSOL-HC) 2.5 % rectal cream, Place 1 application rectally 2 (two) times daily., Disp: 30 g, Rfl: 0 .  levothyroxine (SYNTHROID, LEVOTHROID) 50 MCG tablet, Take by mouth., Disp: , Rfl:  .  LORazepam (ATIVAN) 0.5 MG tablet, Take 1 tablet (0.5 mg total) by mouth 2 (two) times daily as needed for anxiety (Nausea)., Disp: 40 tablet, Rfl: 5 .  metoCLOPramide (REGLAN) 10 MG tablet, Take 1 tablet (10 mg total) by mouth every 6 (six) hours as needed for nausea., Disp: 100 tablet, Rfl: 5 .  omeprazole (PRILOSEC) 40 MG capsule, Take by mouth., Disp: , Rfl:  .  ondansetron (ZOFRAN) 4 MG tablet, Take by mouth., Disp: , Rfl:  .  polyethylene glycol-electrolytes (NULYTELY/GOLYTELY) 420 G  solution, Take by mouth., Disp: , Rfl:  .  promethazine (PHENERGAN) 25 MG suppository, Place 25 mg rectally every 6 (six) hours as needed for nausea or vomiting., Disp: , Rfl:  .  sucralfate (CARAFATE) 1 GM/10ML suspension, Take by mouth., Disp: , Rfl:  .  traMADol (ULTRAM) 50 MG tablet, Take 1 tablet (50 mg total) by mouth every 8 (eight) hours as needed., Disp: 60 tablet, Rfl: 0 .  traZODone (DESYREL) 50 MG tablet, Take 50 mg by mouth., Disp: , Rfl:   Past Surgical History  Procedure Laterality Date  . Abdominal hysterectomy      partial hysterectomy  . Colonoscopy with propofol N/A 06/27/2015    Procedure: COLONOSCOPY WITH PROPOFOL;  Surgeon: Kristina Class, MD;  [facility] ENDOSCOPY;  Service: Endoscopy;  Laterality: N/A;    Family History  Problem Relation Age of Onset  . Diabetes Mother     Allergies  Allergen Reactions  . Percocet [Oxycodone-Acetaminophen] Palpitations     Review of Systems  CONSTITUTIONAL: No significant weight changes, fever, chills, weakness or fatigue.   CARDIOVASCULAR: No chest pain, chest pressure or chest discomfort. No palpitations or edema.  RESPIRATORY: No shortness of breath, cough or sputum.  GASTROINTESTINAL: Chronic intermitent nausea sometimes with vomiting. No changes in bowel habits. No abdominal pain. Yes rectal bleed. GENITOURINARY: No dysuria. No frequency. No discharge.  NEUROLOGICAL: No headache, dizziness, syncope, paralysis, ataxia, numbness or tingling in the extremities. No memory changes. No change in bowel or bladder control.  MUSCULOSKELETAL: Yes joint pain. No muscle pain. HEMATOLOGIC: No anemia, bleeding or bruising.     Objective  BP 108/76 mmHg  Pulse 85  Temp(Src) 98.2 F (36.8 C) (Oral)  Resp 16  Wt 157 lb 3.2 oz (71.305 kg)  SpO2 98%  LMP 05/09/1993 (Approximate) Body mass index is 29.72 kg/(m^2).  Physical Exam  Constitutional: Patient appears well-developed and well-nourished. In no distress.   Cardiovascular: Normal rate, regular rhythm and normal heart sounds.  No murmur heard.  Pulmonary/Chest: Effort normal and breath sounds normal. No respiratory distress. Abdomen: Soft, non tender, non distended, normal bowel sounds in all four quadrants. Rectal exam deferred.  Musculoskeletal: Normal range of motion bilateral UE and LE, no joint effusions. Right knee no crepitus. More pain with valgus and varus stress testing however no overt laxity noted. Negative drawer's testing and lachman's testing.  Peripheral vascular: Bilateral LE no edema. Skin: Skin is warm and dry. No rash noted. No erythema.  Psychiatric: Patient has a normal mood and affect. Behavior is normal in office today. Judgment and thought content normal in office today.    Assessment & Plan  1. Rectal bleeding Ongoing issues, referred back to GI.  - Ambulatory referral to Gastroenterology  2. Right knee pain Will start with x-ray, continue heat ice and elevation. If no improvement may need referral back to ortho.  -  DG Knee Complete 4 Views Right; Future - traMADol (ULTRAM) 50 MG tablet; Take 1 tablet (50 mg total) by mouth every 8 (eight) hours as needed.  Dispense: 60 tablet; Refill: 0

## 2015-07-08 NOTE — Addendum Note (Signed)
Addended by: Bobetta Lime on: 07/08/2015 03:56 PM   Modules accepted: Miquel Dunn

## 2015-07-09 ENCOUNTER — Telehealth: Payer: Self-pay

## 2015-07-09 NOTE — Telephone Encounter (Signed)
Patient stated that her knee was swollen last night and the pain has increased. I then asked if she wanted to proceed with the ortho referral and she said that she could not at this time due to not having the money. I informed her that we could have the referral placed but then she could contact their office to see about an appointment and/or payment plan. She said ok.

## 2015-07-09 NOTE — Telephone Encounter (Signed)
Tried to contact this patient to see what she needed [name] to clarify but there was no answer. A message was left for her to give [name] a call back.

## 2015-07-09 NOTE — Telephone Encounter (Signed)
Patient returned my call and it was explained to her what degenerative changes was. She understood and said thanks.

## 2015-07-09 NOTE — Telephone Encounter (Signed)
Patient is requesting a return call, she stated that she was not able to understand what was being said.

## 2015-07-17 ENCOUNTER — Ambulatory Visit: Payer: Medicaid Other | Admitting: Podiatry

## 2015-07-31 ENCOUNTER — Encounter: Payer: Self-pay | Admitting: Podiatry

## 2015-07-31 ENCOUNTER — Ambulatory Visit (INDEPENDENT_AMBULATORY_CARE_PROVIDER_SITE_OTHER): Payer: Medicaid Other | Admitting: Podiatry

## 2015-07-31 VITALS — BP 123/75 | HR 76 | Resp 18

## 2015-07-31 DIAGNOSIS — M722 Plantar fascial fibromatosis: Secondary | ICD-10-CM

## 2015-07-31 NOTE — Progress Notes (Signed)
She presents today for follow-up of injection neuroma plantar forefoot left. She states is starting to hurt in the arch and more back in here as she points to the left heel.  Objective: Vital signs are stable alert and oriented 3. She has pain on palpation medial calcaneal tubercle no pain on medial and lateral compression of the calcaneus. Pulses remain palpable.  Assessment: Well-healing neuroma left plantar fasciitis left.  Plan: Injected Kenalog and local anesthetic to her left heel today placed her in the plantar fascial brace. Follow up with me as needed.

## 2015-08-05 ENCOUNTER — Encounter: Payer: Self-pay | Admitting: Family Medicine

## 2015-08-05 ENCOUNTER — Ambulatory Visit (INDEPENDENT_AMBULATORY_CARE_PROVIDER_SITE_OTHER): Payer: Medicaid Other | Admitting: Family Medicine

## 2015-08-05 VITALS — BP 124/74 | HR 77 | Temp 98.2°F | Resp 16 | Wt 160.5 lb

## 2015-08-05 DIAGNOSIS — G8929 Other chronic pain: Secondary | ICD-10-CM | POA: Diagnosis not present

## 2015-08-05 DIAGNOSIS — F329 Major depressive disorder, single episode, unspecified: Secondary | ICD-10-CM

## 2015-08-05 DIAGNOSIS — F419 Anxiety disorder, unspecified: Secondary | ICD-10-CM

## 2015-08-05 DIAGNOSIS — E034 Atrophy of thyroid (acquired): Secondary | ICD-10-CM

## 2015-08-05 DIAGNOSIS — E038 Other specified hypothyroidism: Secondary | ICD-10-CM

## 2015-08-05 DIAGNOSIS — M79641 Pain in right hand: Secondary | ICD-10-CM

## 2015-08-05 DIAGNOSIS — F418 Other specified anxiety disorders: Secondary | ICD-10-CM

## 2015-08-05 MED ORDER — LEVOTHYROXINE SODIUM 50 MCG PO TABS: 50.0000 ug | ORAL_TABLET | Freq: Every day | ORAL | Status: DC

## 2015-08-05 MED ORDER — MELOXICAM 15 MG PO TABS: 15.0000 mg | ORAL_TABLET | Freq: Every day | ORAL | Status: DC

## 2015-08-05 MED ORDER — FLUOXETINE HCL 20 MG PO CAPS: 20.0000 mg | ORAL_CAPSULE | Freq: Every day | ORAL | Status: DC
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
## 2015-08-05 NOTE — Progress Notes (Deleted)
Name: Kristina Reed   MRN: [id number]    DOB: 1966-07-28   Date:08/05/2015       Progress Note  Subjective  Chief Complaint  No chief complaint on file.   HPI  ***  Past Medical History  Diagnosis Date  . Thyroid disease   . Arthritis     right knee and right elbow  . Gallstones   . Vaginal inclusion cyst   . Cancer ([place])     Cervical CA with partial hysterectomy.  . Status post partial hysterectomy     Due to Cervical CA  . GERD (gastroesophageal reflux disease)     Patient Active Problem List   Diagnosis Date Noted  . Need for influenza vaccination 05/24/2015  . Acute pain of left foot 05/17/2015  . Mass of right breast on mammogram 05/03/2015  . Annual physical exam 05/03/2015  . Cervical cancer screening 05/03/2015  . External hemorrhoid 05/03/2015  . Rectal bleeding 04/30/2015  . Nausea and vomiting in adult patient 02/18/2015  . Right knee pain 07/23/2014  . Anxiety and depression 02/02/2014  . Bladder dysfunction 02/02/2014  . Acid reflux 02/02/2014  . Adult hypothyroidism 02/02/2014  . Obstructive apnea 02/02/2014  . Incomplete bladder emptying 12/12/2012  . Tenosynovitis of foot 05/30/2012  . Benign neoplasm of kidney 05/13/2012  . Symptoms involving urinary system 05/13/2012  . Urge incontinence 05/13/2012  . FOM (frequency of micturition) 05/13/2012  . Extremity pain 05/05/2012  . Tarsal tunnel syndrome 03/03/2012    Social History  Substance Use Topics  . Smoking status: Never Smoker   . Smokeless tobacco: Never Used  . Alcohol Use: No     Current outpatient prescriptions:  .  docusate sodium (STOOL SOFTENER) 100 MG capsule, Take 100 mg by mouth., Disp: , Rfl:  .  FLUoxetine (PROZAC) 20 MG capsule, Take 1 capsule (20 mg total) by mouth daily., Disp: 30 capsule, Rfl: 5 .  hydrocortisone (ANUSOL-HC) 2.5 % rectal cream, Place 1 application rectally 2 (two) times daily., Disp: 30 g, Rfl: 0 .  levothyroxine (SYNTHROID, LEVOTHROID) 50 MCG tablet,  Take by mouth., Disp: , Rfl:  .  LORazepam (ATIVAN) 0.5 MG tablet, Take 1 tablet (0.5 mg total) by mouth 2 (two) times daily as needed for anxiety (Nausea)., Disp: 40 tablet, Rfl: 5 .  metoCLOPramide (REGLAN) 10 MG tablet, Take 1 tablet (10 mg total) by mouth every 6 (six) hours as needed for nausea., Disp: 100 tablet, Rfl: 5 .  omeprazole (PRILOSEC) 40 MG capsule, Take by mouth., Disp: , Rfl:  .  ondansetron (ZOFRAN) 4 MG tablet, Take by mouth., Disp: , Rfl:  .  polyethylene glycol-electrolytes (NULYTELY/GOLYTELY) 420 G solution, Take by mouth., Disp: , Rfl:  .  promethazine (PHENERGAN) 25 MG suppository, Place 25 mg rectally every 6 (six) hours as needed for nausea or vomiting., Disp: , Rfl:  .  sucralfate (CARAFATE) 1 GM/10ML suspension, Take by mouth., Disp: , Rfl:  .  traMADol (ULTRAM) 50 MG tablet, Take 1 tablet (50 mg total) by mouth every 8 (eight) hours as needed., Disp: 60 tablet, Rfl: 0 .  traZODone (DESYREL) 50 MG tablet, Take 50 mg by mouth., Disp: , Rfl:   Past Surgical History  Procedure Laterality Date  . Abdominal hysterectomy      partial hysterectomy  . Colonoscopy with propofol N/A 06/27/2015    Procedure: COLONOSCOPY WITH PROPOFOL;  Surgeon: Josefine Class, MD;  Location[facility] ENDOSCOPY;  Service: Endoscopy;  Laterality: N/A;  Family History  Problem Relation Age of Onset  . Diabetes Mother     Allergies  Allergen Reactions  . Percocet [Oxycodone-Acetaminophen] Palpitations     Review of Systems  CONSTITUTIONAL: No significant weight changes, fever, chills, weakness or fatigue.  HEENT:  - Eyes: No visual changes.  - Ears: No auditory changes. No pain.  - Nose: No sneezing, congestion, runny nose. - Throat: No sore throat. No changes in swallowing. SKIN: No rash or itching.  CARDIOVASCULAR: No chest pain, chest pressure or chest discomfort. No palpitations or edema.  RESPIRATORY: No shortness of breath, cough or sputum.  GASTROINTESTINAL: No  anorexia, nausea, vomiting. No changes in bowel habits. No abdominal pain or blood.  GENITOURINARY: No dysuria. No frequency. No discharge. *** NEUROLOGICAL: No headache, dizziness, syncope, paralysis, ataxia, numbness or tingling in the extremities. No memory changes. No change in bowel or bladder control.  MUSCULOSKELETAL: No joint pain. No muscle pain. HEMATOLOGIC: No anemia, bleeding or bruising.  LYMPHATICS: No enlarged lymph nodes.  PSYCHIATRIC: No change in mood. No change in sleep pattern.  ENDOCRINOLOGIC: No reports of sweating, cold or heat intolerance. No polyuria or polydipsia.     Objective  LMP 05/09/1993 (Approximate) There is no weight on file to calculate BMI.  Physical Exam  Constitutional: Patient appears well-developed and well-nourished. In no distress.  HEENT:  - Head: Normocephalic and atraumatic.  - Ears: Bilateral TMs gray, no erythema or effusion - Nose: Nasal mucosa moist - Mouth/Throat: Oropharynx is clear and moist. No tonsillar hypertrophy or erythema. No post nasal drainage.  - Eyes: Conjunctivae clear, EOM movements normal. PERRLA. No scleral icterus.  Neck: Normal range of motion. Neck supple. No JVD present. No thyromegaly present.  Cardiovascular: Normal rate, regular rhythm and normal heart sounds.  No murmur heard.  Pulmonary/Chest: Effort normal and breath sounds normal. No respiratory distress. Musculoskeletal: Normal range of motion bilateral UE and LE, no joint effusions. Peripheral vascular: Bilateral LE no edema. Neurological: CN II-XII grossly intact with no focal deficits. Alert and oriented to person, place, and time. Coordination, balance, strength, speech and gait are normal.  Skin: Skin is warm and dry. No rash noted. No erythema.  Psychiatric: Patient has a normal mood and affect. Behavior is normal in office today. Judgment and thought content normal in office today.  Assessment & Plan

## 2015-09-11 ENCOUNTER — Ambulatory Visit: Payer: Medicaid Other | Admitting: Podiatry

## 2015-09-30 ENCOUNTER — Ambulatory Visit: Payer: Medicaid Other | Admitting: Podiatry

## 2015-10-16 ENCOUNTER — Ambulatory Visit: Payer: Medicaid Other | Admitting: Podiatry

## 2015-10-25 ENCOUNTER — Ambulatory Visit (INDEPENDENT_AMBULATORY_CARE_PROVIDER_SITE_OTHER): Payer: Medicaid Other | Admitting: Family Medicine

## 2015-10-25 ENCOUNTER — Encounter: Payer: Self-pay | Admitting: Family Medicine

## 2015-10-25 VITALS — BP 118/82 | HR 90 | Temp 98.1°F | Resp 14 | Ht 61.0 in | Wt 155.9 lb

## 2015-10-25 DIAGNOSIS — N63 Unspecified lump in breast: Secondary | ICD-10-CM

## 2015-10-25 DIAGNOSIS — E034 Atrophy of thyroid (acquired): Secondary | ICD-10-CM

## 2015-10-25 DIAGNOSIS — N6310 Unspecified lump in the right breast, unspecified quadrant: Secondary | ICD-10-CM

## 2015-10-25 DIAGNOSIS — R110 Nausea: Secondary | ICD-10-CM | POA: Insufficient documentation

## 2015-10-25 DIAGNOSIS — M25561 Pain in right knee: Secondary | ICD-10-CM | POA: Diagnosis not present

## 2015-10-25 DIAGNOSIS — E038 Other specified hypothyroidism: Secondary | ICD-10-CM

## 2015-10-25 DIAGNOSIS — Z113 Encounter for screening for infections with a predominantly sexual mode of transmission: Secondary | ICD-10-CM | POA: Diagnosis not present

## 2015-10-25 NOTE — Addendum Note (Signed)
Addended by: Docia Furl on: 10/25/2015 10:52 AM   Modules accepted: Orders

## 2015-10-25 NOTE — Progress Notes (Signed)
Name: Kristina Reed   MRN: [id number]    DOB: [birth year]   Date:10/25/2015       Progress Note  Subjective  Chief Complaint  Chief Complaint  Patient presents with  . Medication Refill  . Depression  . Hypothyroidism  . Gastroesophageal Reflux  . Insomnia  . Exposure to STD    Wants to be tested HIV  . Knee Pain    right, cannot bend     HPI  Kristina Reed is a 50 year old female here for routine follow up of chronic conditions and get refills. She reports her moods are stable, no hypothyroid symptoms, reflux well controled. Continues to have issues with various joints. Can not afford many medications at this time. Taking hypothyroid meds when she can get them. She would like STD testing due to her boyfriend questioning her fidelity. She denies vaginal symptoms.   She is due to have repeat imaging on right breast due to most likely benign mass found in 2016.  [name] complains of right knee pain on and off for nearly a year now. She previously also complained of foot pain and was referred to podiatry. She has tried ice, head, elevation and this has not help alleviate her symptoms. She has had to use crutches in the past to walk and feels there is laxity and instability in the knee. No known trauma to knee. Pain is described as sharp and achy over the posterior aspect of knee without swelling or redness. She has had left knee ligament tear which was repaired some years ago by Dr. Sabra Heck, [facility]. She states she does not have any money to go back to ortho at this time. She is also not using Meloxicam, tramadol regularly.   Patient complains of previous heartburn. This has been associated with belching, heartburn and waterbrash.  She has associated chronic nausea. Symptoms have been present for several years. She denies dysphagia.  She has not lost unwanted excessive weight. She denies melena, hematochezia, hematemesis, and coffee ground emesis. Medical therapy in the past has included  antacids, H2 antagonists and proton pump inhibitors.  Past Medical History  Diagnosis Date  . Thyroid disease   . Arthritis     right knee and right elbow  . Gallstones   . Vaginal inclusion cyst   . Cancer ([place])     Cervical CA with partial hysterectomy.  . Status post partial hysterectomy     Due to Cervical CA  . GERD (gastroesophageal reflux disease)     Patient Active Problem List   Diagnosis Date Noted  . Chronic nausea 10/25/2015  . Mass of right breast on mammogram 05/03/2015  . External hemorrhoid 05/03/2015  . Rectal bleeding 04/30/2015  . Right knee pain 07/23/2014  . Major depressive disorder, recurrent, in full remission with anxious distress ([place]) 02/02/2014  . Bladder dysfunction 02/02/2014  . GERD without esophagitis 02/02/2014  . Adult hypothyroidism 02/02/2014  . Obstructive apnea 02/02/2014  . Incomplete bladder emptying 12/12/2012  . Tenosynovitis of foot 05/30/2012  . Benign neoplasm of kidney 05/13/2012  . Symptoms involving urinary system 05/13/2012  . Urge incontinence 05/13/2012  . FOM (frequency of micturition) 05/13/2012  . Chronic pain of right hand 05/05/2012  . Tarsal tunnel syndrome 03/03/2012    Social History  Substance Use Topics  . Smoking status: Never Smoker   . Smokeless tobacco: Never Used  . Alcohol Use: No     Current outpatient prescriptions:  .  FLUoxetine (PROZAC) 20  MG capsule, Take 1 capsule (20 mg total) by mouth daily., Disp: 30 capsule, Rfl: 5 .  levothyroxine (SYNTHROID, LEVOTHROID) 50 MCG tablet, Take 1 tablet (50 mcg total) by mouth daily before breakfast., Disp: 30 tablet, Rfl: 5 .  LORazepam (ATIVAN) 0.5 MG tablet, Take 1 tablet (0.5 mg total) by mouth 2 (two) times daily as needed for anxiety (Nausea)., Disp: 40 tablet, Rfl: 5 .  meloxicam (MOBIC) 15 MG tablet, Take 1 tablet (15 mg total) by mouth daily., Disp: 30 tablet, Rfl: 5 .  metoCLOPramide (REGLAN) 10 MG tablet, Take 1 tablet (10 mg total) by mouth every  6 (six) hours as needed for nausea., Disp: 100 tablet, Rfl: 5 .  omeprazole (PRILOSEC) 40 MG capsule, Take by mouth., Disp: , Rfl:  .  ondansetron (ZOFRAN) 4 MG tablet, Take by mouth., Disp: , Rfl:  .  polyethylene glycol-electrolytes (NULYTELY/GOLYTELY) 420 G solution, Take by mouth., Disp: , Rfl:  .  promethazine (PHENERGAN) 25 MG suppository, Place 25 mg rectally every 6 (six) hours as needed for nausea or vomiting., Disp: , Rfl:  .  sucralfate (CARAFATE) 1 GM/10ML suspension, Take by mouth., Disp: , Rfl:  .  traZODone (DESYREL) 50 MG tablet, Take 50 mg by mouth., Disp: , Rfl:   Past Surgical History  Procedure Laterality Date  . Abdominal hysterectomy      partial hysterectomy  . Colonoscopy with propofol N/A 06/27/2015    Procedure: COLONOSCOPY WITH PROPOFOL;  Surgeon: Josefine Class, MD;  Location: [facility] ENDOSCOPY;  Service: Endoscopy;  Laterality: N/A;    Family History  Problem Relation Age of Onset  . Diabetes Mother     Allergies  Allergen Reactions  . Percocet [Oxycodone-Acetaminophen] Palpitations     Review of Systems  CONSTITUTIONAL: No significant weight changes, fever, chills, weakness or fatigue.  HEENT:  - Eyes: No visual changes.  - Ears: No auditory changes. No pain.  - Nose: No sneezing, congestion, runny nose. - Throat: No sore throat. No changes in swallowing. SKIN: No rash or itching.  CARDIOVASCULAR: No chest pain, chest pressure or chest discomfort. No palpitations or edema.  RESPIRATORY: No shortness of breath, cough or sputum.  GASTROINTESTINAL: No anorexia, nausea, vomiting. No changes in bowel habits. No abdominal pain or blood.  GENITOURINARY: No dysuria. No frequency. No discharge.  NEUROLOGICAL: No headache, dizziness, syncope, paralysis, ataxia, numbness or tingling in the extremities. No memory changes. No change in bowel or bladder control.  MUSCULOSKELETAL: No joint pain. No muscle pain. HEMATOLOGIC: No anemia, bleeding or bruising.   LYMPHATICS: No enlarged lymph nodes.  PSYCHIATRIC: No change in mood. No change in sleep pattern.  ENDOCRINOLOGIC: No reports of sweating, cold or heat intolerance. No polyuria or polydipsia.     Objective  BP 118/82 mmHg  Pulse 90  Temp(Src) 98.1 F (36.7 C) (Oral)  Resp 14  Ht 5\' 1"  (1.549 m)  Wt 155 lb 14.4 oz (70.716 kg)  BMI 29.47 kg/m2  SpO2 98%  LMP 05/09/1993 (Approximate) Body mass index is 29.47 kg/(m^2).  Physical Exam  Constitutional: Patient appears well-developed and well-nourished. In no distress.  Cardiovascular: Normal rate, regular rhythm and normal heart sounds. No murmur heard.  Pulmonary/Chest: Effort normal and breath sounds normal. No respiratory distress. Abdomen: Soft, non tender, non distended, normal bowel sounds in all four quadrants.  Musculoskeletal: Normal range of motion bilateral UE and LE, no joint effusions. Right knee no crepitus. More pain with valgus and varus stress testing however no overt laxity noted.  Negative drawer's testing and lachman's testing.  Peripheral vascular: Bilateral LE no edema. Skin: Skin is warm and dry. No rash noted. No erythema.  Psychiatric: Patient has a stable happy mood and affect. Behavior is normal in office today. Judgment and thought content normal in office today.   Assessment & Plan  1. Hypothyroidism due to acquired atrophy of thyroid Inconsistent use of hypothyroid medication.  - TSH - T3, free - T4, free  2. Mass of right breast on mammogram Due to have repeat imaging done.  - MM Digital Diagnostic Bilat; Future - MM Digital Diagnostic Unilat R; Future - US BREAST LTD UNI RIGHT INC AXILLA; Future  3. Right knee pain Chronic stable findings. Advised conservative use, wear knee sleeve. Needs to f/u with Ortho when she can afford it.   4. Screening for STD (sexually transmitted disease)  - HIV antibody - RPR - Hepatitis C antibody - Chlamydia/Gonococcus/Trichomonas, NAA

## 2015-10-26 LAB — TSH: TSH: 4.46 u[IU]/mL (ref 0.450–4.500)

## 2015-10-26 LAB — T4, FREE: FREE T4: 1.03 ng/dL (ref 0.82–1.77)

## 2015-10-26 LAB — RPR: RPR Ser Ql: NONREACTIVE

## 2015-10-26 LAB — T3, FREE: T3, Free: 2.7 pg/mL (ref 2.0–4.4)

## 2015-10-26 LAB — HEPATITIS C ANTIBODY: Hep C Virus Ab: <0.1 s/co ratio (ref 0.0–0.9)

## 2015-10-26 LAB — HIV ANTIBODY (ROUTINE TESTING W REFLEX): HIV SCREEN 4TH GENERATION: NONREACTIVE

## 2015-10-28 LAB — CHLAMYDIA/GONOCOCCUS/TRICHOMONAS, NAA
Chlamydia by NAA: NEGATIVE
Gonococcus by NAA: NEGATIVE
Trich vag by NAA: NEGATIVE

## 2015-10-29 ENCOUNTER — Telehealth: Payer: Self-pay | Admitting: Family Medicine

## 2015-10-29 NOTE — Telephone Encounter (Signed)
Patient checking status on urine results

## 2015-10-29 NOTE — Telephone Encounter (Signed)
Patient notified all labs were normal.

## 2015-11-04 ENCOUNTER — Telehealth: Payer: Self-pay | Admitting: Family Medicine

## 2015-11-04 ENCOUNTER — Ambulatory Visit: Payer: Medicaid Other | Admitting: Podiatry

## 2015-11-04 NOTE — Telephone Encounter (Signed)
Called patient back, she states is not sick and thyroid was normal.  I told her not sure what it is, but may be hormone related like a hot flash?

## 2015-11-04 NOTE — Telephone Encounter (Signed)
Pt said at night she sweats real bad then she is cold. She is just wants to know what she needs to do.

## 2015-11-13 ENCOUNTER — Encounter: Payer: Self-pay | Admitting: Podiatry

## 2015-11-13 ENCOUNTER — Ambulatory Visit (INDEPENDENT_AMBULATORY_CARE_PROVIDER_SITE_OTHER): Payer: Medicaid Other | Admitting: Podiatry

## 2015-11-13 VITALS — BP 145/93 | HR 80 | Resp 16

## 2015-11-13 DIAGNOSIS — M76822 Posterior tibial tendinitis, left leg: Secondary | ICD-10-CM

## 2015-11-13 DIAGNOSIS — G5762 Lesion of plantar nerve, left lower limb: Secondary | ICD-10-CM

## 2015-11-13 DIAGNOSIS — M722 Plantar fascial fibromatosis: Secondary | ICD-10-CM | POA: Diagnosis not present

## 2015-11-13 NOTE — Progress Notes (Signed)
She presents today states that the neuroma is feeling much better however her plantar fascia of her left foot is still sore and she also has tenderness right here she points to the posterior tibial tendon at its insertion site on the navicular. She denies questions or concerns pain. No shortness of breath.  Objective: Vital signs are stable alert and oriented 3 pulses are palpable. Neurologic sensorium is intact. Deep tendon reflexes are intact. She has pain on palpation medial continue tubercle of the left heel and on the palpation of the posterior tibial tendon at its insertion site.  Assessment: Resolving neuroma third interdigital space. Posterior tibial tendinitis as well as plantar fasciitis.  Plan: Injected these areas today with Kenalog and local anesthetic as well as the tendon with dexamethasone and local anesthetic. Follow-up with her in 6 weeks we also discussed appropriate shoe gear stretching exercises and ice therapy.

## 2015-12-25 ENCOUNTER — Ambulatory Visit
Admission: RE | Admit: 2015-12-25 | Discharge: 2015-12-25 | Disposition: A | Discharge status: Home / Self Care | Payer: Medicaid Other | Source: Ambulatory Visit | Attending: Family Medicine | Admitting: Family Medicine

## 2015-12-25 ENCOUNTER — Other Ambulatory Visit: Payer: Self-pay | Admitting: Family Medicine

## 2015-12-25 ENCOUNTER — Telehealth: Payer: Self-pay | Admitting: Family Medicine

## 2015-12-25 DIAGNOSIS — N6310 Unspecified lump in the right breast, unspecified quadrant: Secondary | ICD-10-CM

## 2015-12-25 DIAGNOSIS — R928 Other abnormal and inconclusive findings on diagnostic imaging of breast: Secondary | ICD-10-CM

## 2015-12-25 DIAGNOSIS — N63 Unspecified lump in breast: Secondary | ICD-10-CM | POA: Insufficient documentation

## 2015-12-25 NOTE — Telephone Encounter (Signed)
[facility] CALLED LATE YESTERDAY ( 12-25-15) SAYING THAT THEY NEED AN ORDER FOR THIS PATIENT FOR AN BIOPOSY. PLEASE SEE THE REPORT THAT IS PLACE IN HER CHART FOR DOS 12-25-15.

## 2015-12-26 ENCOUNTER — Telehealth: Payer: Self-pay | Admitting: Family Medicine

## 2015-12-26 NOTE — Telephone Encounter (Signed)
Information regarding imaging sent to Dr. Sanda Klein

## 2015-12-26 NOTE — Telephone Encounter (Signed)
I see the patient has an abnormal breast imaging and they are wanting to biopsy places in the left and right breasts Please let her know we'll be thinking about her and hope everything turns out okay See if there is anything we need to do to facilitate the biopsy (orders, etc.)

## 2015-12-27 NOTE — Telephone Encounter (Signed)
Left voicemail with [facility]

## 2015-12-27 NOTE — Telephone Encounter (Signed)
Done, they faxed and you have already signed orders.

## 2016-01-01 ENCOUNTER — Ambulatory Visit
Admission: RE | Admit: 2016-01-01 | Discharge: 2016-01-01 | Disposition: A | Discharge status: Home / Self Care | Payer: Medicaid Other | Source: Ambulatory Visit | Attending: Family Medicine | Admitting: Family Medicine

## 2016-01-01 DIAGNOSIS — N63 Unspecified lump in breast: Secondary | ICD-10-CM | POA: Diagnosis present

## 2016-01-01 DIAGNOSIS — R928 Other abnormal and inconclusive findings on diagnostic imaging of breast: Secondary | ICD-10-CM

## 2016-01-01 DIAGNOSIS — D242 Benign neoplasm of left breast: Secondary | ICD-10-CM | POA: Diagnosis not present

## 2016-01-01 DIAGNOSIS — Z9889 Other specified postprocedural states: Secondary | ICD-10-CM

## 2016-01-01 HISTORY — PX: BREAST BIOPSY: SHX20

## 2016-01-03 LAB — SURGICAL PATHOLOGY

## 2016-01-07 DIAGNOSIS — Z9889 Other specified postprocedural states: Secondary | ICD-10-CM | POA: Insufficient documentation

## 2016-01-13 ENCOUNTER — Ambulatory Visit (INDEPENDENT_AMBULATORY_CARE_PROVIDER_SITE_OTHER): Payer: Medicaid Other | Admitting: Family Medicine

## 2016-01-13 ENCOUNTER — Encounter: Payer: Self-pay | Admitting: Family Medicine

## 2016-01-13 VITALS — BP 132/82 | HR 73 | Temp 98.4°F | Resp 14 | Wt 153.9 lb

## 2016-01-13 DIAGNOSIS — G4733 Obstructive sleep apnea (adult) (pediatric): Secondary | ICD-10-CM

## 2016-01-13 DIAGNOSIS — E038 Other specified hypothyroidism: Secondary | ICD-10-CM | POA: Diagnosis not present

## 2016-01-13 DIAGNOSIS — R7401 Elevation of levels of liver transaminase levels: Secondary | ICD-10-CM

## 2016-01-13 DIAGNOSIS — F418 Other specified anxiety disorders: Secondary | ICD-10-CM | POA: Diagnosis not present

## 2016-01-13 DIAGNOSIS — E034 Atrophy of thyroid (acquired): Secondary | ICD-10-CM | POA: Diagnosis not present

## 2016-01-13 DIAGNOSIS — K625 Hemorrhage of anus and rectum: Secondary | ICD-10-CM | POA: Diagnosis not present

## 2016-01-13 DIAGNOSIS — R61 Generalized hyperhidrosis: Secondary | ICD-10-CM

## 2016-01-13 DIAGNOSIS — R740 Nonspecific elevation of levels of transaminase and lactic acid dehydrogenase [LDH]: Secondary | ICD-10-CM | POA: Diagnosis not present

## 2016-01-13 DIAGNOSIS — M25561 Pain in right knee: Secondary | ICD-10-CM

## 2016-01-13 DIAGNOSIS — F329 Major depressive disorder, single episode, unspecified: Secondary | ICD-10-CM

## 2016-01-13 DIAGNOSIS — F32A Depression, unspecified: Secondary | ICD-10-CM

## 2016-01-13 DIAGNOSIS — F419 Anxiety disorder, unspecified: Secondary | ICD-10-CM

## 2016-01-13 MED ORDER — FLUOXETINE HCL 20 MG PO CAPS: 20.0000 mg | ORAL_CAPSULE | Freq: Every day | ORAL | Status: DC

## 2016-01-13 NOTE — Assessment & Plan Note (Signed)
Check TSH 

## 2016-01-13 NOTE — Assessment & Plan Note (Signed)
See Jefm Bryant this week; check CBC

## 2016-01-13 NOTE — Progress Notes (Signed)
BP 132/82 mmHg  Pulse 73  Temp(Src) 98.4 F (36.9 C) (Oral)  Resp 14  Wt 153 lb 14.4 oz (69.809 kg)  SpO2 98%  LMP 05/09/1993 (Approximate)   Subjective:    Patient ID: Kristina Reed, female    DOB: 23-Dec-1965, 50 y.o.   MRN: [id number]  HPI: SHABANA MOSCHELLA is a 50 y.o. female  Chief Complaint  Patient presents with  . Excessive Sweating   Patient has had breast biopsy since last visit and was told it was fatty tissue; no cancer Has been sweating for several months; she has hypothyroidism; she has lost 3 pounds despite eating a lot; weight goes up and down though she says; hair falling out; I asked about stool patterns; she has hemorrhoids and goes to [place] specialist on Friday for that; did have some diarrhea a few days ago Trouble behind the right knee; sleeps with right knee bent and it wakes her from sleep; trouble bending the right knee; already had surgery on the right knee; she tore the ligament in her left knee, Dr. Sabra Heck is her ortho; no old injury  Reviewed prior labs and noted AST was 80; she does not drink regularly, maybe on the weekends, just 3-4 at a time; not more than 7 drinks per week; smokes when she drinks; last CBC normal  We reviewed her problem list; she has OSA, but not using CPAP; boyfriend says she has trouble breathing when she sleep  She does not see a psychiatrist; she has fluoxetine, lorazepam, trazodone on her list  Depression screen [facility] 2/9 01/13/2016 10/25/2015 05/24/2015 04/22/2015  Decreased Interest 0 0 0 2  Down, Depressed, Hopeless 1 0 0 2  PHQ - 2 Score 1 0 0 4  Altered sleeping - - 0 1  Tired, decreased energy - - 0 1  Change in appetite - - 0 0  Feeling bad or failure about yourself  - - 0 1  Trouble concentrating - - 0 1  Moving slowly or fidgety/restless - - 0 0  Suicidal thoughts - - 0 0  PHQ-9 Score - - 0 8  Difficult doing work/chores - - - Somewhat difficult   Relevant past medical, surgical, family and social history reviewed  and updated as indicated. Past Medical History  Diagnosis Date  . Thyroid disease   . Arthritis     right knee and right elbow  . Gallstones   . Vaginal inclusion cyst   . Cancer ([place])     Cervical CA with partial hysterectomy.  . Status post partial hysterectomy     Due to Cervical CA  . GERD (gastroesophageal reflux disease)   . Elevated AST (SGOT) 01/13/2016  . Hx of cervical cancer 01/30/2016  . Menopause 01/30/2016   Past Surgical History  Procedure Laterality Date  . Abdominal hysterectomy      partial hysterectomy  . Colonoscopy with propofol N/A 06/27/2015    Procedure: COLONOSCOPY WITH PROPOFOL;  Surgeon: Josefine Class, MD;  Location: [facility] ENDOSCOPY;  Service: Endoscopy;  Laterality: N/A;  . Breast biopsy Bilateral 01/01/2016    [name] cores   Family History  Problem Relation Age of Onset  . Diabetes Mother   . Breast cancer Maternal Aunt    Social History  Substance Use Topics  . Smoking status: Never Smoker   . Smokeless tobacco: Never Used  . Alcohol Use: No    Interim medical history since last visit reviewed. Allergies and medications reviewed and updated.  Review of Systems Per HPI unless specifically indicated above     Objective:    BP 132/82 mmHg  Pulse 73  Temp(Src) 98.4 F (36.9 C) (Oral)  Resp 14  Wt 153 lb 14.4 oz (69.809 kg)  SpO2 98%  LMP 05/09/1993 (Approximate)  Wt Readings from Last 3 Encounters:  01/30/16 154 lb (69.854 kg)  01/13/16 153 lb 14.4 oz (69.809 kg)  10/25/15 155 lb 14.4 oz (70.716 kg)    Physical Exam  Constitutional: She appears well-developed and well-nourished. No distress.  HENT:  Head: Normocephalic and atraumatic.  Eyes: EOM are normal. No scleral icterus.  Neck: No thyromegaly present.  Cardiovascular: Normal rate, regular rhythm and normal heart sounds.   No murmur heard. Pulmonary/Chest: Effort normal and breath sounds normal. No respiratory distress. She has no wheezes.  Abdominal: Soft. Bowel sounds  are normal. She exhibits no distension.  Musculoskeletal: Normal range of motion. She exhibits no edema.  Neurological: She is alert. She exhibits normal muscle tone.  Skin: Skin is warm and dry. She is not diaphoretic. No pallor.  Psychiatric: She has a normal mood and affect. Her behavior is normal. Judgment and thought content normal.      Assessment & Plan:   Problem List Items Addressed This Visit      Respiratory   Obstructive apnea    Not using CPAP, will refer to pulm for sleep evaluation and treatment if ongoing OSA      Relevant Orders   Ambulatory referral to Pulmonology     Digestive   Rectal bleeding    See Jefm Bryant this week; check CBC      Relevant Orders   CBC with Differential/Platelet (Completed)     Endocrine   Adult hypothyroidism    Check TSH      Relevant Orders   TSH (Completed)   T4, free (Completed)     Other   Elevated AST (SGOT)   Relevant Orders   Comprehensive metabolic panel (Completed)   Knee pain, right - Primary   Relevant Orders   Ambulatory referral to Orthopedic Surgery   Right knee pain    Refer to orthopaedist       Other Visit Diagnoses    Excessive sweating        Relevant Orders    Lactate Dehydrogenase (LDH) (Completed)    LH (Completed)    FSH (Completed)    Anxiety and depression        Relevant Medications    FLUoxetine (PROZAC) 20 MG capsule       Follow up plan: Return in about 4 weeks (around 02/10/2016) for weight recheck, follow-up.  An after-visit summary was printed and given to the patient at [facility].  Please see the patient instructions which may contain other information and recommendations beyond what is mentioned above in the assessment and plan.  Meds ordered this encounter  Medications  . FLUoxetine (PROZAC) 20 MG capsule    Sig: Take 1 capsule (20 mg total) by mouth daily.    Dispense:  30 capsule    Refill:  5    Orders Placed This Encounter  Procedures  . CBC with  Differential/Platelet  . Comprehensive metabolic panel  . TSH  . T4, free  . Lactate Dehydrogenase (LDH)  . LH  . College  . Ambulatory referral to Orthopedic Surgery  . Ambulatory referral to Pulmonology

## 2016-01-13 NOTE — Patient Instructions (Addendum)
Please have labs done today We'll contact you with the results I've put in a referral for you to see the knee doctor Please think about NEVER smoking another cigarette again  Smoking Cessation, Tips for Success If you are ready to quit smoking, congratulations! You have chosen to help yourself be healthier. Cigarettes bring nicotine, tar, carbon monoxide, and other irritants into your body. Your lungs, heart, and blood vessels will be able to work better without these poisons. There are many different ways to quit smoking. Nicotine gum, nicotine patches, a nicotine inhaler, or nicotine nasal spray can help with physical craving. Hypnosis, support groups, and medicines help break the habit of smoking. WHAT THINGS CAN I DO TO MAKE QUITTING EASIER?  Here are some tips to help you quit for good:  Pick a date when you will quit smoking completely. Tell all of your friends and family about your plan to quit on that date.  Do not try to slowly cut down on the number of cigarettes you are smoking. Pick a quit date and quit smoking completely starting on that day.  Throw away all cigarettes.   Clean and remove all ashtrays from your home, work, and car.  On a card, write down your reasons for quitting. Carry the card with you and read it when you get the urge to smoke.  Cleanse your body of nicotine. Drink enough water and fluids to keep your urine clear or pale yellow. Do this after quitting to flush the nicotine from your body.  Learn to predict your moods. Do not let a bad situation be your excuse to have a cigarette. Some situations in your life might tempt you into wanting a cigarette.  Never have "just one" cigarette. It leads to wanting another and another. Remind yourself of your decision to quit.  Change habits associated with smoking. If you smoked while driving or when feeling stressed, try other activities to replace smoking. Stand up when drinking your coffee. Brush your teeth after  eating. Sit in a different chair when you read the paper. Avoid alcohol while trying to quit, and try to drink fewer caffeinated beverages. Alcohol and caffeine may urge you to smoke.  Avoid foods and drinks that can trigger a desire to smoke, such as sugary or spicy foods and alcohol.  Ask people who smoke not to smoke around you.  Have something planned to do right after eating or having a cup of coffee. For example, plan to take a walk or exercise.  Try a relaxation exercise to calm you down and decrease your stress. Remember, you may be tense and nervous for the first 2 weeks after you quit, but this will pass.  Find new activities to keep your hands busy. Play with a pen, coin, or rubber band. Doodle or draw things on paper.  Brush your teeth right after eating. This will help cut down on the craving for the taste of tobacco after meals. You can also try mouthwash.   Use oral substitutes in place of cigarettes. Try using lemon drops, carrots, cinnamon sticks, or chewing gum. Keep them handy so they are available when you have the urge to smoke.  When you have the urge to smoke, try deep breathing.  Designate your home as a nonsmoking area.  If you are a heavy smoker, ask your health care provider about a prescription for nicotine chewing gum. It can ease your withdrawal from nicotine.  Reward yourself. Set aside the cigarette money you save  and buy yourself something nice.  Look for support from others. Join a support group or smoking cessation program. Ask someone at home or at work to help you with your plan to quit smoking.  Always ask yourself, "Do I need this cigarette or is this just a reflex?" Tell yourself, "Today, I choose not to smoke," or "I do not want to smoke." You are reminding yourself of your decision to quit.  Do not replace cigarette smoking with electronic cigarettes (commonly called e-cigarettes). The safety of e-cigarettes is unknown, and some may contain  harmful chemicals.  If you relapse, do not give up! Plan ahead and think about what you will do the next time you get the urge to smoke. HOW WILL I FEEL WHEN I QUIT SMOKING? You may have symptoms of withdrawal because your body is used to nicotine (the addictive substance in cigarettes). You may crave cigarettes, be irritable, feel very hungry, cough often, get headaches, or have difficulty concentrating. The withdrawal symptoms are only temporary. They are strongest when you first quit but will go away within 10-14 days. When withdrawal symptoms occur, stay in control. Think about your reasons for quitting. Remind yourself that these are signs that your body is healing and getting used to being without cigarettes. Remember that withdrawal symptoms are easier to treat than the major diseases that smoking can cause.  Even after the withdrawal is over, expect periodic urges to smoke. However, these cravings are generally short lived and will go away whether you smoke or not. Do not smoke! WHAT RESOURCES ARE AVAILABLE TO HELP ME QUIT SMOKING? Your health care provider can direct you to community resources or hospitals for support, which may include:  Group support.  Education.  Hypnosis.  Therapy.   This information is not intended to replace advice given to you by your health care provider. Make sure you discuss any questions you have with your health care provider.   Document Released: 05/29/2004 Document Revised: 09/21/2014 Document Reviewed: 02/16/2013 Elsevier Interactive Patient Education [employer].

## 2016-01-13 NOTE — Assessment & Plan Note (Signed)
Refer to orthopaedist 

## 2016-01-13 NOTE — Assessment & Plan Note (Signed)
Not using CPAP, will refer to pulm for sleep evaluation and treatment if ongoing OSA

## 2016-01-14 ENCOUNTER — Other Ambulatory Visit: Payer: Self-pay | Admitting: Family Medicine

## 2016-01-14 DIAGNOSIS — E034 Atrophy of thyroid (acquired): Secondary | ICD-10-CM

## 2016-01-14 LAB — COMPREHENSIVE METABOLIC PANEL
ALK PHOS: 69 IU/L (ref 39–117)
ALT: 19 IU/L (ref 0–32)
AST: 21 IU/L (ref 0–40)
Albumin/Globulin Ratio: 1.6 (ref 1.2–2.2)
Albumin: 4.5 g/dL (ref 3.5–5.5)
BUN/Creatinine Ratio: 8 — ABNORMAL LOW (ref 9–23)
BUN: 7 mg/dL (ref 6–24)
Bilirubin Total: <0.2 mg/dL (ref 0.0–1.2)
CALCIUM: 9.5 mg/dL (ref 8.7–10.2)
CO2: 25 mmol/L (ref 18–29)
CREATININE: 0.84 mg/dL (ref 0.57–1.00)
Chloride: 97 mmol/L (ref 96–106)
GFR calc Af Amer: 96 mL/min/{1.73_m2} (ref >59)
GFR, EST NON AFRICAN AMERICAN: 83 mL/min/{1.73_m2} (ref >59)
GLUCOSE: 84 mg/dL (ref 65–99)
Globulin, Total: 2.9 g/dL (ref 1.5–4.5)
Potassium: 4.4 mmol/L (ref 3.5–5.2)
Sodium: 137 mmol/L (ref 134–144)
Total Protein: 7.4 g/dL (ref 6.0–8.5)

## 2016-01-14 LAB — CBC WITH DIFFERENTIAL/PLATELET
BASOS ABS: 0 10*3/uL (ref 0.0–0.2)
Basos: 1 %
EOS (ABSOLUTE): 0.2 10*3/uL (ref 0.0–0.4)
Eos: 3 %
HEMOGLOBIN: 13.7 g/dL (ref 11.1–15.9)
Hematocrit: 40.6 % (ref 34.0–46.6)
IMMATURE GRANS (ABS): 0 10*3/uL (ref 0.0–0.1)
Immature Granulocytes: 0 %
LYMPHS: 44 %
Lymphocytes Absolute: 2.9 10*3/uL (ref 0.7–3.1)
MCH: 29.7 pg (ref 26.6–33.0)
MCHC: 33.7 g/dL (ref 31.5–35.7)
MCV: 88 fL (ref 79–97)
MONOCYTES: 7 %
Monocytes Absolute: 0.4 10*3/uL (ref 0.1–0.9)
Neutrophils Absolute: 3 10*3/uL (ref 1.4–7.0)
Neutrophils: 45 %
PLATELETS: 203 10*3/uL (ref 150–379)
RBC: 4.61 x10E6/uL (ref 3.77–5.28)
RDW: 13.1 % (ref 12.3–15.4)
WBC: 6.6 10*3/uL (ref 3.4–10.8)

## 2016-01-14 LAB — TSH: TSH: 5.29 u[IU]/mL — AB (ref 0.450–4.500)

## 2016-01-14 LAB — LUTEINIZING HORMONE: LH: 38.1 m[IU]/mL

## 2016-01-14 LAB — LACTATE DEHYDROGENASE: LDH: 173 IU/L (ref 119–226)

## 2016-01-14 LAB — FOLLICLE STIMULATING HORMONE: FSH: 38.3 m[IU]/mL

## 2016-01-14 LAB — T4, FREE: FREE T4: 1.14 ng/dL (ref 0.82–1.77)

## 2016-01-14 MED ORDER — LEVOTHYROXINE SODIUM 50 MCG PO TABS: ORAL_TABLET | ORAL | Status: DC

## 2016-01-14 NOTE — Assessment & Plan Note (Signed)
Recheck TSH in 8-12 weeks after adjusting dose slightly

## 2016-01-15 ENCOUNTER — Telehealth: Payer: Self-pay | Admitting: Family Medicine

## 2016-01-15 ENCOUNTER — Other Ambulatory Visit: Payer: Self-pay

## 2016-01-15 DIAGNOSIS — E034 Atrophy of thyroid (acquired): Secondary | ICD-10-CM

## 2016-01-15 NOTE — Telephone Encounter (Signed)
I received another request for thyroid medicine; I just prescribed new Rx with new instructions yesterday; please resolve with pharmacy If they are asking for 90 day supply, that's not going to be appropriate because she'll need labs in 8 weeks, dose may change

## 2016-01-15 NOTE — Telephone Encounter (Signed)
PT SAID SHE HAD A QUESTION ABOUT HER TEST. PLEASE CALL HER BACK WITH THE RESULTS

## 2016-01-15 NOTE — Telephone Encounter (Signed)
Sorry pt unaware you had sent

## 2016-01-23 ENCOUNTER — Telehealth: Payer: Self-pay | Admitting: Family Medicine

## 2016-01-23 DIAGNOSIS — Z780 Asymptomatic menopausal state: Secondary | ICD-10-CM

## 2016-01-23 DIAGNOSIS — Z8541 Personal history of malignant neoplasm of cervix uteri: Secondary | ICD-10-CM

## 2016-01-23 NOTE — Telephone Encounter (Signed)
Pt called stating she is sweating night and day and it seems to be a lot more than normal. Pt would like a call back.

## 2016-01-30 ENCOUNTER — Ambulatory Visit (INDEPENDENT_AMBULATORY_CARE_PROVIDER_SITE_OTHER): Payer: Medicaid Other | Admitting: Internal Medicine

## 2016-01-30 ENCOUNTER — Encounter: Payer: Self-pay | Admitting: Family Medicine

## 2016-01-30 ENCOUNTER — Encounter: Payer: Self-pay | Admitting: Internal Medicine

## 2016-01-30 VITALS — BP 122/74 | HR 81 | Ht 61.5 in | Wt 154.0 lb

## 2016-01-30 DIAGNOSIS — G4733 Obstructive sleep apnea (adult) (pediatric): Secondary | ICD-10-CM

## 2016-01-30 DIAGNOSIS — Z8541 Personal history of malignant neoplasm of cervix uteri: Secondary | ICD-10-CM

## 2016-01-30 DIAGNOSIS — Z780 Asymptomatic menopausal state: Secondary | ICD-10-CM | POA: Insufficient documentation

## 2016-01-30 HISTORY — DX: Personal history of malignant neoplasm of cervix uteri: Z85.41

## 2016-01-30 MED ORDER — CLONIDINE HCL 0.1 MG PO TABS: ORAL_TABLET | ORAL | Status: DC

## 2016-01-30 NOTE — Telephone Encounter (Signed)
I spoke with patient Worse at night Recent breast biopsy Reviewed BP and pulse Let's try clonidine instead of HRT for now; give this 1-2 weeks and give me an update, call back if needed before that though

## 2016-01-30 NOTE — Addendum Note (Signed)
Addended by: Maryanna Shape A on: 01/30/2016 11:19 AM   Modules accepted: Orders

## 2016-01-30 NOTE — Patient Instructions (Addendum)
Will send for sleep study.    Sleep Apnea Sleep apnea is disorder that affects a person's sleep. A person with sleep apnea has abnormal pauses in their breathing when they sleep. It is hard for them to get a good sleep. This makes a person tired during the day. It also can lead to other physical problems. There are three types of sleep apnea. One type is when breathing stops for a short time because your airway is blocked (obstructive sleep apnea). Another type is when the brain sometimes fails to give the normal signal to breathe to the muscles that control your breathing (central sleep apnea). The third type is a combination of the other two types. HOME CARE   Take all medicine as told by your doctor.  Avoid alcohol, calming medicines (sedatives), and depressant drugs.  Try to lose weight if you are overweight. Talk to your doctor about a healthy weight goal.  Your doctor may have you use a device that helps to open your airway. It can help you get the air that you need. It is called a positive airway pressure (PAP) device.   MAKE SURE YOU:   Understand these instructions.  Will watch your condition.  Will get help right away if you are not doing well or get worse.  It may take approximately 1 month for you to get used to wearing her CPAP every night.

## 2016-01-30 NOTE — Progress Notes (Signed)
[facility] Consultation      Assessment and Plan:  Obstructive sleep apnea. -The patient has a history of OSA, was previously on CPAP, but was taken away, I presume this is because of inadequate use. We'll therefore need to send her for another sleep study in order to requalify her CPAP.  Excessive daytime sleepiness. -We will send for sleep study, symptoms are consistent with obstructive sleep apnea.  Snoring.  -Snoring and witnessed apneas, likely due to obstructive sleep apnea.  GERD.  -GERD, can be contributed to by objective sleep apnea, therefore, it will be important to monitor this. Going forwards.  Chronic rhinitis.  -Does have sinus drainage, we discussed the possibility of starting on Flonase. If this becomes a hindrance in terms of tolerance of CPAP.  Date: 01/30/2016  MRN# [id number] Kristina Reed 1966/04/07  Referring Physician: Dr. Suzanne Boron is a 50 y.o. old female seen in consultation for chief complaint of:    Chief Complaint  Patient presents with  . sleep consult    prt ref by dr. lada. pt c/o daytime sleepiness, loud snoring, wakes up gasping for air. had sleep study around 2014 unable to wear CPAP due to not being able to get used to mask. EPWORTH: 18    HPI:   The patient is a 49 year old female with a history of sleep apnea. She goes to bed between 8:30 and 9 PM, falls asleep within approximately one hour. She wakes up about 3 times per night. She usually gets out of bed to start her day at 6 AM. Epworth score is 18 today.  She is known to have a history of OSA, she stopped using it, but she can no answer why she could no longer use it. She comes in now as she as she has a new physician and though this should be revisited. Her boyfriend notes that she breathes "kinda hard" when she is sleeping, and has to wake her up.  She thinks that her sleep study was about 3 years ago, not sure where. Her machine was taken away, likely due  to noncompliance.   She has reflux symptoms when eating spicy foods. She has runny nose, no pets at home.     PMHX:   Past Medical History  Diagnosis Date  . Thyroid disease   . Arthritis     right knee and right elbow  . Gallstones   . Vaginal inclusion cyst   . Cancer ([place])     Cervical CA with partial hysterectomy.  . Status post partial hysterectomy     Due to Cervical CA  . GERD (gastroesophageal reflux disease)   . Elevated AST (SGOT) 01/13/2016   Surgical Hx:  Past Surgical History  Procedure Laterality Date  . Abdominal hysterectomy      partial hysterectomy  . Colonoscopy with propofol N/A 06/27/2015    Procedure: COLONOSCOPY WITH PROPOFOL;  Surgeon: Josefine Class, MD;  Location: [facility] ENDOSCOPY;  Service: Endoscopy;  Laterality: N/A;  . Breast biopsy Bilateral 01/01/2016    [name] cores   Family Hx:  Family History  Problem Relation Age of Onset  . Diabetes Mother   . Breast cancer Maternal Aunt    Social Hx:   Social History  Substance Use Topics  . Smoking status: Never Smoker   . Smokeless tobacco: Never Used  . Alcohol Use: No   Medication:   Current Outpatient Rx  Name  Route  Sig  Dispense  Refill  . FLUoxetine (PROZAC) 20 MG capsule   Oral   Take 1 capsule (20 mg total) by mouth daily.   30 capsule   5   . levothyroxine (SYNTHROID, LEVOTHROID) 50 MCG tablet      One whole pill by mouth daily on 5 days of the week, and one and one-half pills on 2 days of the week   35 tablet   1     Cancel other prescription for thyroid med please;  ...   . meloxicam (MOBIC) 15 MG tablet   Oral   Take 1 tablet (15 mg total) by mouth daily.   30 tablet   5   . metoCLOPramide (REGLAN) 10 MG tablet   Oral   Take 1 tablet (10 mg total) by mouth every 6 (six) hours as needed for nausea.   100 tablet   5   . omeprazole (PRILOSEC) 40 MG capsule   Oral   Take by mouth.         . ondansetron (ZOFRAN) 4 MG tablet   Oral   Take by mouth.           . promethazine (PHENERGAN) 25 MG suppository   Rectal   Place 25 mg rectally every 6 (six) hours as needed for nausea or vomiting.             Allergies:  Percocet  Review of Systems: Gen:  Denies  fever, sweats, chills HEENT: Denies blurred vision, double vision. bleeds, sore throat Cvc:  No dizziness, chest pain. Resp:   Denies cough or sputum production, shortness of breath Gi: Denies swallowing difficulty, stomach pain. Gu:  Denies bladder incontinence, burning urine Ext:   No Joint pain, stiffness. Skin: No skin rash,  hives  Endoc:  No polyuria, polydipsia. Psych: No depression, insomnia. Other:  All other systems were reviewed with the patient and were negative other that what is mentioned in the HPI.   Physical Examination:   VS: BP 122/74 mmHg  Pulse 81  Ht 5' 1.5" (1.562 m)  Wt 154 lb (69.854 kg)  BMI 28.63 kg/m2  SpO2 98%  LMP 05/09/1993 (Approximate)  General Appearance: No distress  Neuro:without focal findings,  speech normal,  HEENT: PERRLA, EOM intact.   Pulmonary: normal breath sounds, No wheezing.  CardiovascularNormal S1,S2.  No m/r/g.   Abdomen: Benign, Soft, non-tender. Renal:  No costovertebral tenderness  GU:  No performed at this time. Endoc: No evident thyromegaly, no signs of acromegaly. Skin:   warm, no rashes, no ecchymosis  Extremities: normal, no cyanosis, clubbing.  Other findings:    LABORATORY PANEL:   CBC No results for input(s): WBC, HGB, HCT, PLT in the last 168 hours. ------------------------------------------------------------------------------------------------------------------  Chemistries  No results for input(s): NA, K, CL, CO2, GLUCOSE, BUN, CREATININE, CALCIUM, MG, AST, ALT, ALKPHOS, BILITOT in the last 168 hours.  Invalid input(s): GFRCGP ------------------------------------------------------------------------------------------------------------------  Cardiac Enzymes No results for input(s): TROPONINI in  the last 168 hours. ------------------------------------------------------------  RADIOLOGY:  No results found.     Thank  you for the consultation and for allowing [facility] to assist in the care of your patient. Our recommendations are noted above.  Please contact us if we can be of further service.   Marda Stalker, MD.  Board Certified in Internal Medicine, Pulmonary Medicine, [place], and Sleep Medicine.  [facility] Office Number: [phone]  Patricia Pesa, M.D.  Vilinda Boehringer, M.D.  Merton Border,  M.D  01/30/2016

## 2016-02-13 ENCOUNTER — Ambulatory Visit: Payer: Medicaid Other | Admitting: Family Medicine

## 2016-03-10 ENCOUNTER — Ambulatory Visit (INDEPENDENT_AMBULATORY_CARE_PROVIDER_SITE_OTHER): Payer: Medicaid Other | Admitting: Family Medicine

## 2016-03-10 ENCOUNTER — Encounter: Payer: Self-pay | Admitting: Family Medicine

## 2016-03-10 VITALS — BP 118/72 | HR 80 | Temp 98.1°F | Resp 16 | Wt 158.0 lb

## 2016-03-10 DIAGNOSIS — G4733 Obstructive sleep apnea (adult) (pediatric): Secondary | ICD-10-CM | POA: Diagnosis not present

## 2016-03-10 DIAGNOSIS — Z8541 Personal history of malignant neoplasm of cervix uteri: Secondary | ICD-10-CM

## 2016-03-10 DIAGNOSIS — E034 Atrophy of thyroid (acquired): Secondary | ICD-10-CM

## 2016-03-10 DIAGNOSIS — M25561 Pain in right knee: Secondary | ICD-10-CM | POA: Diagnosis not present

## 2016-03-10 DIAGNOSIS — E038 Other specified hypothyroidism: Secondary | ICD-10-CM | POA: Diagnosis not present

## 2016-03-10 DIAGNOSIS — R102 Pelvic and perineal pain: Secondary | ICD-10-CM

## 2016-03-10 DIAGNOSIS — R8290 Unspecified abnormal findings in urine: Secondary | ICD-10-CM | POA: Diagnosis not present

## 2016-03-10 DIAGNOSIS — R61 Generalized hyperhidrosis: Secondary | ICD-10-CM

## 2016-03-10 LAB — COMPLETE METABOLIC PANEL WITH GFR
ALT: 18 U/L (ref 6–29)
AST: 19 U/L (ref 10–35)
Albumin: 4.1 g/dL (ref 3.6–5.1)
Alkaline Phosphatase: 68 U/L (ref 33–115)
BUN: 10 mg/dL (ref 7–25)
CALCIUM: 9.5 mg/dL (ref 8.6–10.2)
CHLORIDE: 97 mmol/L — AB (ref 98–110)
CO2: 26 mmol/L (ref 20–31)
CREATININE: 0.81 mg/dL (ref 0.50–1.10)
GFR, EST NON AFRICAN AMERICAN: 87 mL/min (ref >=60)
Glucose, Bld: 72 mg/dL (ref 65–99)
POTASSIUM: 4.3 mmol/L (ref 3.5–5.3)
Sodium: 135 mmol/L (ref 135–146)
Total Bilirubin: 0.3 mg/dL (ref 0.2–1.2)
Total Protein: 6.8 g/dL (ref 6.1–8.1)

## 2016-03-10 LAB — CBC WITH DIFFERENTIAL/PLATELET
Basophils Absolute: 64 cells/uL (ref 0–200)
Basophils Relative: 1 %
EOS ABS: 192 {cells}/uL (ref 15–500)
Eosinophils Relative: 3 %
HEMATOCRIT: 38.7 % (ref 35.0–45.0)
Hemoglobin: 13 g/dL (ref 11.7–15.5)
LYMPHS PCT: 37 %
Lymphs Abs: 2368 cells/uL (ref 850–3900)
MCH: 29 pg (ref 27.0–33.0)
MCHC: 33.6 g/dL (ref 32.0–36.0)
MCV: 86.2 fL (ref 80.0–100.0)
MONO ABS: 384 {cells}/uL (ref 200–950)
MONOS PCT: 6 %
MPV: 9.7 fL (ref 7.5–12.5)
NEUTROS PCT: 53 %
Neutro Abs: 3392 cells/uL (ref 1500–7800)
PLATELETS: 227 10*3/uL (ref 140–400)
RBC: 4.49 MIL/uL (ref 3.80–5.10)
RDW: 13.3 % (ref 11.0–15.0)
WBC: 6.4 10*3/uL (ref 3.8–10.8)

## 2016-03-10 LAB — TSH: TSH: 5.67 m[IU]/L — AB

## 2016-03-10 MED ORDER — LEVOTHYROXINE SODIUM 50 MCG PO TABS: ORAL_TABLET | ORAL | Status: DC

## 2016-03-10 NOTE — Assessment & Plan Note (Signed)
May have baker's cyst or ligament injury; encouraged her to call her orthopaedist

## 2016-03-10 NOTE — Patient Instructions (Addendum)
Call Kristina Reed and ask him if you have a Baker's Cyst in your right knee Let's get labs today We'll get a CT scan Do start taking your thyroid medicine

## 2016-03-10 NOTE — Assessment & Plan Note (Signed)
Encouraged patient to call her social worker; needs to take her medicine

## 2016-03-10 NOTE — Assessment & Plan Note (Signed)
Going to have study tomorrow night and followed by pulmonologist

## 2016-03-10 NOTE — Assessment & Plan Note (Signed)
Will get abd/pelvic CT scan

## 2016-03-10 NOTE — Progress Notes (Signed)
BP 118/72   Pulse 80   Temp 98.1 F (36.7 C) (Oral)   Resp 16   Wt 158 lb (71.7 kg)   LMP 05/09/1993 (Approximate)   SpO2 98%   BMI 29.37 kg/m    Subjective:    Patient ID: Kristina Reed, female    DOB: 04/09/1966, 50 y.o.   MRN: [id number]  HPI: Kristina Reed is a 50 y.o. female  Chief Complaint  Patient presents with  . Follow-up   Patient is here for follow-up; note Jan 13, 2016 reviewed; still sweating; had breast biopsy and NOT cancer, just fatty tissue; no weight loss; appetite is okay; not drinking any alcohol at all any more; not smoking Her last TSH was abnormal; due for recheck of thyroid today; adjusted dose; she ran out of her medicines, so did not take the thyroid medicine for the last month; she is out of everything  Lab Results  Component Value Date   TSH 5.67 (H) 03/10/2016   Lab Results  Component Value Date   WBC 6.4 03/10/2016   HGB 13.0 03/10/2016   HCT 38.7 03/10/2016   MCV 86.2 03/10/2016   PLT 227 03/10/2016  previous SGOT was 80 10 months ago and dropped to 21 last month Last LDH was normal  She saw the orthopaedist at [facility] in [place] for right knee pain; going on for almost 2 months; wearing brace; no medicine; she is putting ice on it; hurts to walk; no medicine at home  She saw Dr. Ashby Dawes in May for obstructive sleep apnea; she does not wear the mask yet, going tomoorrow night for that; going to have sleep study  Mood is okay; off of fluoxetine; not taking lorazepam  Hx of pelvic discomfort now and then, no pain with intimacy; just comes and goes  Depression screen [facility] 2/9 01/13/2016 10/25/2015 05/24/2015 04/22/2015  Decreased Interest 0 0 0 2  Down, Depressed, Hopeless 1 0 0 2  PHQ - 2 Score 1 0 0 4  Altered sleeping - - 0 1  Tired, decreased energy - - 0 1  Change in appetite - - 0 0  Feeling bad or failure about yourself  - - 0 1  Trouble concentrating - - 0 1  Moving slowly or fidgety/restless - - 0 0  Suicidal thoughts -  - 0 0  PHQ-9 Score - - 0 8  Difficult doing work/chores - - - Somewhat difficult   Relevant past medical, surgical, family and social history reviewed Past Medical History:  Diagnosis Date  . Arthritis    right knee and right elbow  . Cancer ([place])    Cervical CA with partial hysterectomy.  . Elevated AST (SGOT) 01/13/2016  . Gallstones   . GERD (gastroesophageal reflux disease)   . Hx of cervical cancer 01/30/2016  . Menopause 01/30/2016  . Status post partial hysterectomy    Due to Cervical CA  . Thyroid disease   . Vaginal inclusion cyst    Past Surgical History:  Procedure Laterality Date  . ABDOMINAL HYSTERECTOMY     partial hysterectomy  . BREAST BIOPSY Bilateral 01/01/2016   [name] cores  . COLONOSCOPY WITH PROPOFOL N/A 06/27/2015   Procedure: COLONOSCOPY WITH PROPOFOL;  Surgeon: Josefine Class, MD;  Location: [facility] ENDOSCOPY;  Service: Endoscopy;  Laterality: N/A;   Family History  Problem Relation Age of Onset  . Diabetes Mother   . Breast cancer Maternal Aunt    Social History  Substance Use  Topics  . Smoking status: Never Smoker  . Smokeless tobacco: Never Used  . Alcohol use No   Interim medical history since last visit reviewed. Allergies and medications reviewed  Review of Systems Per HPI unless specifically indicated above     Objective:    BP 118/72   Pulse 80   Temp 98.1 F (36.7 C) (Oral)   Resp 16   Wt 158 lb (71.7 kg)   LMP 05/09/1993 (Approximate)   SpO2 98%   BMI 29.37 kg/m   Wt Readings from Last 3 Encounters:  03/10/16 158 lb (71.7 kg)  01/30/16 154 lb (69.9 kg)  01/13/16 153 lb 14.4 oz (69.8 kg)    Physical Exam  Constitutional: She appears well-developed and well-nourished. No distress.  Weight gain four pounds in 5+ weeks  HENT:  Head: Normocephalic and atraumatic.  Eyes: EOM are normal. No scleral icterus.  Neck: No JVD present. No thyromegaly present.  Cardiovascular: Normal rate, regular rhythm and normal heart sounds.     No murmur heard. Pulmonary/Chest: Effort normal and breath sounds normal.  Abdominal: Soft. Bowel sounds are normal. She exhibits no distension.  Musculoskeletal: Normal range of motion. She exhibits no edema.  Lymphadenopathy:    She has no cervical adenopathy.  Neurological: She is alert.  Skin: Skin is warm and dry. She is not diaphoretic. No pallor.  Psychiatric: She has a normal mood and affect.    Results for orders placed or performed in visit on 03/10/16  Urine culture  Result Value Ref Range   Colony Count 7,000 COLONIES/ML    Organism ID, Bacteria Insignificant Growth   Urinalysis w microscopic + reflex cultur  Result Value Ref Range   Color, Urine YELLOW YELLOW   APPearance CLEAR CLEAR   Specific Gravity, Urine 1.010 1.001 - 1.035   pH 6.5 5.0 - 8.0   Glucose, UA NEGATIVE NEGATIVE   Bilirubin Urine NEGATIVE NEGATIVE   Ketones, ur NEGATIVE NEGATIVE   Hgb urine dipstick NEGATIVE NEGATIVE   Protein, ur NEGATIVE NEGATIVE   Nitrite NEGATIVE NEGATIVE   Leukocytes, UA 3+ (A) NEGATIVE   WBC, UA >60 (A) <=5 WBC/HPF   RBC / HPF NONE SEEN <=2 RBC/HPF   Squamous Epithelial / LPF 0-5 <=5 HPF   Bacteria, UA NONE SEEN NONE SEEN HPF   Crystals NONE SEEN NONE SEEN HPF   Casts NONE SEEN NONE SEEN LPF   Yeast NONE SEEN NONE SEEN HPF  TSH  Result Value Ref Range   TSH 5.67 (H) mIU/L  COMPLETE METABOLIC PANEL WITH GFR  Result Value Ref Range   Sodium 135 135 - 146 mmol/L   Potassium 4.3 3.5 - 5.3 mmol/L   Chloride 97 (L) 98 - 110 mmol/L   CO2 26 20 - 31 mmol/L   Glucose, Bld 72 65 - 99 mg/dL   BUN 10 7 - 25 mg/dL   Creat 0.81 0.50 - 1.10 mg/dL   Total Bilirubin 0.3 0.2 - 1.2 mg/dL   Alkaline Phosphatase 68 33 - 115 U/L   AST 19 10 - 35 U/L   ALT 18 6 - 29 U/L   Total Protein 6.8 6.1 - 8.1 g/dL   Albumin 4.1 3.6 - 5.1 g/dL   Calcium 9.5 8.6 - 10.2 mg/dL   GFR, Est African American >89 >=60 mL/min   GFR, Est Non African American 87 >=60 mL/min  Luteinizing hormone   Result Value Ref Range   LH 16.1 mIU/mL  Follicle stimulating hormone  Result  Value Ref Range   FSH 18.6 mIU/mL  CBC with Differential/Platelet  Result Value Ref Range   WBC 6.4 3.8 - 10.8 K/uL   RBC 4.49 3.80 - 5.10 MIL/uL   Hemoglobin 13.0 11.7 - 15.5 g/dL   HCT 38.7 35.0 - 45.0 %   MCV 86.2 80.0 - 100.0 fL   MCH 29.0 27.0 - 33.0 pg   MCHC 33.6 32.0 - 36.0 g/dL   RDW 13.3 11.0 - 15.0 %   Platelets 227 140 - 400 K/uL   MPV 9.7 7.5 - 12.5 fL   Neutro Abs 3,392 1,500 - 7,800 cells/uL   Lymphs Abs 2,368 850 - 3,900 cells/uL   Monocytes Absolute 384 200 - 950 cells/uL   Eosinophils Absolute 192 15 - 500 cells/uL   Basophils Absolute 64 0 - 200 cells/uL   Neutrophils Relative % 53 %   Lymphocytes Relative 37 %   Monocytes Relative 6 %   Eosinophils Relative 3 %   Basophils Relative 1 %   Smear Review Criteria for review not met       Assessment & Plan:   Problem List Items Addressed This Visit      Respiratory   Obstructive apnea    Going to have study tomorrow night and followed by pulmonologist        Endocrine   Adult hypothyroidism - Primary    Encouraged patient to call her social worker; needs to take her medicine      Relevant Orders   TSH (Completed)     Other   Knee pain, right    May have baker's cyst or ligament injury; encouraged her to call her orthopaedist      Hx of cervical cancer    Will get abd/pelvic CT scan      Relevant Orders   CT Abdomen Pelvis W Contrast (Completed)    Other Visit Diagnoses    Abnormal urinalysis       Relevant Orders   CT Abdomen Pelvis W Contrast (Completed)   Urinalysis w microscopic + reflex cultur (Completed)   Night sweats       Relevant Orders   CT Abdomen Pelvis W Contrast (Completed)   Luteinizing hormone (Completed)   Follicle stimulating hormone (Completed)   Pelvic pain in female       Relevant Orders   CT Abdomen Pelvis W Contrast (Completed)   COMPLETE METABOLIC PANEL WITH GFR (Completed)    CBC with Differential/Platelet (Completed)      Follow up plan: Return in about 6 weeks (around 04/21/2016) for thyroid, but sooner if needed.  An after-visit summary was printed and given to the patient at [place].  Please see the patient instructions which may contain other information and recommendations beyond what is mentioned above in the assessment and plan.  Meds ordered this encounter  Medications  . DISCONTD: LORazepam (ATIVAN) 0.5 MG tablet    Sig: Take by mouth.  . DISCONTD: traMADol (ULTRAM) 50 MG tablet    Sig: Take by mouth.  . DISCONTD: traZODone (DESYREL) 50 MG tablet    Sig: Take by mouth.  . DISCONTD: levothyroxine (SYNTHROID, LEVOTHROID) 50 MCG tablet    Sig: One whole pill by mouth daily on 5 days of the week, and one and one-half pills on 2 days of the week    Dispense:  35 tablet    Refill:  1    Cancel other prescription for thyroid med please; dose adjustment; thanks!    Orders Placed  This Encounter  Procedures  . Urine culture  . CT Abdomen Pelvis W Contrast  . Urinalysis w microscopic + reflex cultur  . TSH  . COMPLETE METABOLIC PANEL WITH GFR  . Luteinizing hormone  . Follicle stimulating hormone  . CBC with Differential/Platelet

## 2016-03-11 ENCOUNTER — Ambulatory Visit: Payer: Medicaid Other | Attending: Pulmonary Disease

## 2016-03-11 ENCOUNTER — Telehealth: Payer: Self-pay | Admitting: Family Medicine

## 2016-03-11 DIAGNOSIS — G4733 Obstructive sleep apnea (adult) (pediatric): Secondary | ICD-10-CM | POA: Diagnosis not present

## 2016-03-11 LAB — URINALYSIS W MICROSCOPIC + REFLEX CULTURE
BACTERIA UA: NONE SEEN [HPF]
BILIRUBIN URINE: NEGATIVE
CRYSTALS: NONE SEEN [HPF]
Casts: NONE SEEN [LPF]
GLUCOSE, UA: NEGATIVE
HGB URINE DIPSTICK: NEGATIVE
KETONES UR: NEGATIVE
Nitrite: NEGATIVE
PROTEIN: NEGATIVE
RBC / HPF: NONE SEEN RBC/HPF (ref <=2)
Specific Gravity, Urine: 1.01 (ref 1.001–1.035)
Yeast: NONE SEEN [HPF]
pH: 6.5 (ref 5.0–8.0)

## 2016-03-11 LAB — LUTEINIZING HORMONE: LH: 13.1 m[IU]/mL

## 2016-03-11 LAB — FOLLICLE STIMULATING HORMONE: FSH: 18.6 m[IU]/mL

## 2016-03-11 MED ORDER — NITROFURANTOIN MONOHYD MACRO 100 MG PO CAPS: 100.0000 mg | ORAL_CAPSULE | Freq: Two times a day (BID) | ORAL | Status: AC

## 2016-03-11 NOTE — Telephone Encounter (Signed)
I called to let patient know that she has an infection; start antibiotics; call tomorrow to go over other labs

## 2016-03-12 ENCOUNTER — Other Ambulatory Visit: Payer: Self-pay | Admitting: Family Medicine

## 2016-03-12 DIAGNOSIS — E034 Atrophy of thyroid (acquired): Secondary | ICD-10-CM

## 2016-03-12 LAB — URINE CULTURE: Colony Count: 7000

## 2016-03-12 MED ORDER — LEVOTHYROXINE SODIUM 75 MCG PO TABS: 75.0000 ug | ORAL_TABLET | Freq: Every day | ORAL | Status: DC

## 2016-03-12 NOTE — Assessment & Plan Note (Signed)
Check TSH in 6 weeks after dose change to 75 mcg daily

## 2016-03-14 NOTE — Telephone Encounter (Signed)
I left detailed message; I se CMA already contacted her about labs; wanted to just make sure infection okay, took antibiotics; recheck thyroid test around Aug 14th; call with any problems or questions

## 2016-03-18 ENCOUNTER — Other Ambulatory Visit: Payer: Self-pay | Admitting: Orthopedic Surgery

## 2016-03-18 DIAGNOSIS — M25561 Pain in right knee: Secondary | ICD-10-CM

## 2016-03-18 DIAGNOSIS — G4733 Obstructive sleep apnea (adult) (pediatric): Secondary | ICD-10-CM | POA: Diagnosis not present

## 2016-03-23 ENCOUNTER — Telehealth: Payer: Self-pay | Admitting: *Deleted

## 2016-03-23 DIAGNOSIS — G4733 Obstructive sleep apnea (adult) (pediatric): Secondary | ICD-10-CM

## 2016-03-23 NOTE — Telephone Encounter (Signed)
Pt informed of sleep study results. Titration ordered. Nothing further needed.

## 2016-03-24 ENCOUNTER — Ambulatory Visit
Admission: RE | Admit: 2016-03-24 | Discharge: 2016-03-24 | Disposition: A | Discharge status: Home / Self Care | Payer: Medicaid Other | Source: Ambulatory Visit | Attending: Family Medicine | Admitting: Family Medicine

## 2016-03-24 DIAGNOSIS — R102 Pelvic and perineal pain: Secondary | ICD-10-CM | POA: Diagnosis present

## 2016-03-24 DIAGNOSIS — Z90710 Acquired absence of both cervix and uterus: Secondary | ICD-10-CM | POA: Insufficient documentation

## 2016-03-24 DIAGNOSIS — R61 Generalized hyperhidrosis: Secondary | ICD-10-CM | POA: Insufficient documentation

## 2016-03-24 DIAGNOSIS — D1771 Benign lipomatous neoplasm of kidney: Secondary | ICD-10-CM | POA: Diagnosis not present

## 2016-03-24 DIAGNOSIS — K760 Fatty (change of) liver, not elsewhere classified: Secondary | ICD-10-CM | POA: Insufficient documentation

## 2016-03-24 DIAGNOSIS — Z9049 Acquired absence of other specified parts of digestive tract: Secondary | ICD-10-CM | POA: Insufficient documentation

## 2016-03-24 MED ORDER — IOPAMIDOL (ISOVUE-300) INJECTION 61%
100.0000 mL | Freq: Once | INTRAVENOUS | Status: AC | PRN
  Administered 2016-03-24: 100 mL via INTRAVENOUS

## 2016-03-26 ENCOUNTER — Ambulatory Visit: Payer: Medicaid Other | Attending: Pulmonary Disease

## 2016-03-26 DIAGNOSIS — G4733 Obstructive sleep apnea (adult) (pediatric): Secondary | ICD-10-CM | POA: Diagnosis not present

## 2016-04-01 ENCOUNTER — Other Ambulatory Visit: Payer: Self-pay | Admitting: Orthopedic Surgery

## 2016-04-01 DIAGNOSIS — M25561 Pain in right knee: Secondary | ICD-10-CM

## 2016-04-06 DIAGNOSIS — G4733 Obstructive sleep apnea (adult) (pediatric): Secondary | ICD-10-CM | POA: Diagnosis not present

## 2016-04-07 ENCOUNTER — Ambulatory Visit: Payer: Medicaid Other

## 2016-04-08 ENCOUNTER — Telehealth: Payer: Self-pay | Admitting: *Deleted

## 2016-04-08 DIAGNOSIS — G4733 Obstructive sleep apnea (adult) (pediatric): Secondary | ICD-10-CM

## 2016-04-08 NOTE — Telephone Encounter (Signed)
Pt informed ordering being placed for her CPAP. Nothing further needed.

## 2016-04-14 ENCOUNTER — Other Ambulatory Visit: Payer: Self-pay | Admitting: Family Medicine

## 2016-04-14 ENCOUNTER — Ambulatory Visit: Payer: Medicaid Other

## 2016-04-14 DIAGNOSIS — Z1231 Encounter for screening mammogram for malignant neoplasm of breast: Secondary | ICD-10-CM

## 2016-04-21 ENCOUNTER — Ambulatory Visit (INDEPENDENT_AMBULATORY_CARE_PROVIDER_SITE_OTHER): Payer: Medicaid Other | Admitting: Family Medicine

## 2016-04-21 ENCOUNTER — Encounter: Payer: Self-pay | Admitting: Family Medicine

## 2016-04-21 VITALS — BP 122/86 | HR 77 | Temp 98.6°F | Resp 14 | Wt 164.0 lb

## 2016-04-21 DIAGNOSIS — F331 Major depressive disorder, recurrent, moderate: Secondary | ICD-10-CM

## 2016-04-21 DIAGNOSIS — N390 Urinary tract infection, site not specified: Secondary | ICD-10-CM

## 2016-04-21 DIAGNOSIS — E039 Hypothyroidism, unspecified: Secondary | ICD-10-CM | POA: Diagnosis not present

## 2016-04-21 DIAGNOSIS — R8281 Pyuria: Secondary | ICD-10-CM

## 2016-04-21 DIAGNOSIS — M25561 Pain in right knee: Secondary | ICD-10-CM | POA: Diagnosis not present

## 2016-04-21 LAB — TSH: TSH: 2.03 m[IU]/L

## 2016-04-21 MED ORDER — LEVOTHYROXINE SODIUM 75 MCG PO TABS: 75.0000 ug | ORAL_TABLET | Freq: Every day | ORAL | 1 refills | Status: DC

## 2016-04-21 MED ORDER — ESCITALOPRAM OXALATE 10 MG PO TABS: 10.0000 mg | ORAL_TABLET | Freq: Every day | ORAL | 0 refills | Status: DC

## 2016-04-21 NOTE — Assessment & Plan Note (Signed)
Wearing brace; going to have MRI done soon

## 2016-04-21 NOTE — Patient Instructions (Addendum)
Please do call your case worker today about your situation and let her know, because she might be able to help you Start back on the thyroid medicine Start new medicine for stress We'll get your thyroid checked and adjust your dose if needed, [phone]  12 Ways to Curb Anxiety  ?Anxiety is normal human sensation. It is what helped our ancestors survive the pitfalls of the wilderness. Anxiety is defined as experiencing worry or nervousness about an imminent event or something with an uncertain outcome. It is a feeling experienced by most people at some point in their lives. Anxiety can be triggered by a very personal issue, such as the illness of a loved one, or an event of global proportions, such as a refugee crisis. Some of the symptoms of anxiety are:  Feeling restless.  Having a feeling of impending danger.  Increased heart rate.  Rapid breathing. Sweating.  Shaking.  Weakness or feeling tired.  Difficulty concentrating on anything except the current worry.  Insomnia.  Stomach or bowel problems. What can we do about anxiety we may be feeling? There are many techniques to help manage stress and relax. Here are 12 ways you can reduce your anxiety almost immediately: 1. Turn off the constant feed of information. Take a social media sabbatical. Studies have shown that social media directly contributes to social anxiety.  2. Monitor your television viewing habits. Are you watching shows that are also contributing to your anxiety, such as 24-hour news stations? Try watching something else, or better yet, nothing at all. Instead, listen to music, read an inspirational book or practice a hobby. 3. Eat nutritious meals. Also, don't skip meals and keep healthful snacks on hand. Hunger and poor diet contributes to feeling anxious. 4. Sleep. Sleeping on a regular schedule for at least seven to eight hours a night will do wonders for your outlook when you are awake. 5. Exercise. Regular exercise  will help rid your body of that anxious energy and help you get more restful sleep. 6. Try deep (diaphragmatic) breathing. Inhale slowly through your nose for five seconds and exhale through your mouth. 7. Practice acceptance and gratitude. When anxiety hits, accept that there are things out of your control that shouldn't be of immediate concern.  8. Seek out humor. When anxiety strikes, watch a funny video, read jokes or call a friend who makes you laugh. Laughter is healing for our bodies and releases endorphins that are calming. 9. Stay positive. Take the effort to replace negative thoughts with positive ones. Try to see a stressful situation in a positive light. Try to come up with solutions rather than dwelling on the problem. 10. Figure out what triggers your anxiety. Keep a journal and make note of anxious moments and the events surrounding them. This will help you identify triggers you can avoid or even eliminate. 11. Talk to someone. Let a trusted friend, family member or even trained professional know that you are feeling overwhelmed and anxious. Verbalize what you are feeling and why.  12. Volunteer. If your anxiety is triggered by a crisis on a large scale, become an advocate and work to resolve the problem that is causing you unease. Anxiety is often unwelcome and can become overwhelming. If not kept in check, it can become a disorder that could require medical treatment. However, if you take the time to care for yourself and avoid the triggers that make you anxious, you will be able to find moments of relaxation and clarity that  make your life much more enjoyable.

## 2016-04-21 NOTE — Assessment & Plan Note (Signed)
Check urine today 

## 2016-04-21 NOTE — Assessment & Plan Note (Addendum)
Now homeless, with change in circumstances; will start SSRI; she reports being safe with supportive boyfriend; encouraged her to contact her social worker to see about housing, voucher, any programs that she might qualify for to help her at this time

## 2016-04-21 NOTE — Progress Notes (Signed)
BP 122/86   Pulse 77   Temp 98.6 F (37 C) (Oral)   Resp 14   Wt 164 lb (74.4 kg)   LMP 05/09/1993 (Approximate)   SpO2 98%   BMI 30.49 kg/m    Subjective:    Patient ID: Kristina Reed, female    DOB: [birth year], 50 y.o.   MRN: [id number]  HPI: Kristina Reed is a 50 y.o. female  Chief Complaint  Patient presents with  . Follow-up   She is here for f/u; housing crisis right now; pretty stressed out; homeless and living in a motel; safe and boyfriend treats her well She has a roof over her head until Sunday; they weren't able to pay their rent and their landlord kicked them out Easily tearful; feeling anxious and worried; she ran out of all her medicines Just ran out of thyroid medicine and took last dose yesterday; did not have dose for today Energy level is fair to poor Reviewed last urine findings with her  Relevant past medical, surgical, family and social history reviewed Past Medical History:  Diagnosis Date  . Arthritis    right knee and right elbow  . Cancer ([place])    Cervical CA with partial hysterectomy.  . Elevated AST (SGOT) 01/13/2016  . Gallstones   . GERD (gastroesophageal reflux disease)   . Hx of cervical cancer 01/30/2016  . Menopause 01/30/2016  . Status post partial hysterectomy    Due to Cervical CA  . Thyroid disease   . Vaginal inclusion cyst    Past Surgical History:  Procedure Laterality Date  . ABDOMINAL HYSTERECTOMY     partial hysterectomy  . BREAST BIOPSY Bilateral 01/01/2016   [name] cores  . COLONOSCOPY WITH PROPOFOL N/A 06/27/2015   Procedure: COLONOSCOPY WITH PROPOFOL;  Surgeon: Josefine Class, MD;  [facility];  Service: Endoscopy;  Laterality: N/A;   Family History  Problem Relation Age of Onset  . Diabetes Mother   . Breast cancer Maternal Aunt    Social History  Substance Use Topics  . Smoking status: Never Smoker  . Smokeless tobacco: Never Used  . Alcohol use No   Interim medical history since last visit  reviewed. Allergies and medications reviewed  Review of Systems Per HPI unless specifically indicated above     Objective:    BP 122/86   Pulse 77   Temp 98.6 F (37 C) (Oral)   Resp 14   Wt 164 lb (74.4 kg)   LMP 05/09/1993 (Approximate)   SpO2 98%   BMI 30.49 kg/m   Wt Readings from Last 3 Encounters:  04/21/16 164 lb (74.4 kg)  03/10/16 158 lb (71.7 kg)  01/30/16 154 lb (69.9 kg)    Physical Exam  Constitutional: She appears well-developed and well-nourished. No distress.  HENT:  Head: Normocephalic and atraumatic.  Mouth/Throat: Mucous membranes are normal.  Eyes: EOM are normal. No scleral icterus.  Neck: No thyromegaly present.  Cardiovascular: Normal rate, regular rhythm and normal heart sounds.   No murmur heard. Pulmonary/Chest: Effort normal and breath sounds normal. No respiratory distress. She has no wheezes.  Abdominal: Soft. Bowel sounds are normal. She exhibits no distension.  Musculoskeletal: She exhibits no edema.       Right knee: She exhibits decreased range of motion.  Wearing hinged brace on the right knee  Neurological: She is alert. She displays no tremor. She exhibits normal muscle tone.  Skin: Skin is warm and dry. She is not diaphoretic.  No pallor.  Psychiatric: Her behavior is normal. Judgment and thought content normal. Her mood appears anxious. She exhibits a depressed mood.  Good eye contact with examiner; full range of affect, but did cry briefly when discussing her housing situation; demonstrated hopefulness and discussed future, goals      Assessment & Plan:   Problem List Items Addressed This Visit      Endocrine   Adult hypothyroidism - Primary    Check TSH and start back on thyroid medicine; adjust dose if needed; under-replaced thyroid disease could exacerbate mood and result in the weight gain noted      Relevant Medications   levothyroxine (SYNTHROID, LEVOTHROID) 75 MCG tablet   Other Relevant Orders   TSH (Completed)       Other   Pyuria    Check urine today      Relevant Orders   Urinalysis w microscopic + reflex cultur (Completed)   Knee pain, right    Wearing brace; going to have MRI done soon      Depression, major, recurrent, moderate (HCC)    Now homeless, with change in circumstances; will start SSRI; she reports being safe with supportive boyfriend; encouraged her to contact her social worker to see about housing, voucher, any programs that she might qualify for to help her at this time      Relevant Medications   escitalopram (LEXAPRO) 10 MG tablet    Other Visit Diagnoses   None.     Follow up plan: Return in about 4 weeks (around 05/19/2016) for medication follow-up.  An after-visit summary was printed and given to the patient at [facility].  Please see the patient instructions which may contain other information and recommendations beyond what is mentioned above in the assessment and plan.  Meds ordered this encounter  Medications  . levothyroxine (SYNTHROID, LEVOTHROID) 75 MCG tablet    Sig: Take 1 tablet (75 mcg total) by mouth daily.    Dispense:  30 tablet    Refill:  1    Cancel other Rx; instruct pt to take this only please; we'll call her too; thanks  . escitalopram (LEXAPRO) 10 MG tablet    Sig: Take 1 tablet (10 mg total) by mouth daily.    Dispense:  30 tablet    Refill:  0    Orders Placed This Encounter  Procedures  . Urine culture  . Urinalysis w microscopic + reflex cultur  . TSH

## 2016-04-22 ENCOUNTER — Other Ambulatory Visit: Payer: Self-pay | Admitting: Family Medicine

## 2016-04-22 LAB — URINALYSIS W MICROSCOPIC + REFLEX CULTURE
Bacteria, UA: NONE SEEN [HPF]
Bilirubin Urine: NEGATIVE
Casts: NONE SEEN [LPF]
Crystals: NONE SEEN [HPF]
GLUCOSE, UA: NEGATIVE
HGB URINE DIPSTICK: NEGATIVE
KETONES UR: NEGATIVE
NITRITE: NEGATIVE
PH: 5.5 (ref 5.0–8.0)
Protein, ur: NEGATIVE
RBC / HPF: NONE SEEN RBC/HPF (ref <=2)
Specific Gravity, Urine: 1.007 (ref 1.001–1.035)
Yeast: NONE SEEN [HPF]

## 2016-04-22 MED ORDER — NITROFURANTOIN MONOHYD MACRO 100 MG PO CAPS: 100.0000 mg | ORAL_CAPSULE | Freq: Two times a day (BID) | ORAL | 0 refills | Status: DC

## 2016-04-23 ENCOUNTER — Other Ambulatory Visit: Payer: Self-pay | Admitting: Family Medicine

## 2016-04-23 ENCOUNTER — Telehealth: Payer: Self-pay | Admitting: Family Medicine

## 2016-04-23 DIAGNOSIS — R8281 Pyuria: Secondary | ICD-10-CM

## 2016-04-23 DIAGNOSIS — N3941 Urge incontinence: Secondary | ICD-10-CM

## 2016-04-23 DIAGNOSIS — R339 Retention of urine, unspecified: Secondary | ICD-10-CM

## 2016-04-23 DIAGNOSIS — R319 Hematuria, unspecified: Secondary | ICD-10-CM | POA: Insufficient documentation

## 2016-04-23 LAB — URINE CULTURE

## 2016-04-23 NOTE — Assessment & Plan Note (Signed)
Refer to urologist 

## 2016-04-23 NOTE — Progress Notes (Signed)
Refer to urologist 

## 2016-04-24 ENCOUNTER — Ambulatory Visit
Admission: RE | Admit: 2016-04-24 | Discharge: 2016-04-24 | Disposition: A | Discharge status: Home / Self Care | Payer: Medicaid Other | Source: Ambulatory Visit | Attending: Orthopedic Surgery | Admitting: Orthopedic Surgery

## 2016-04-24 ENCOUNTER — Other Ambulatory Visit: Payer: Self-pay

## 2016-04-24 DIAGNOSIS — M25561 Pain in right knee: Secondary | ICD-10-CM

## 2016-04-24 DIAGNOSIS — M1711 Unilateral primary osteoarthritis, right knee: Secondary | ICD-10-CM | POA: Diagnosis not present

## 2016-04-24 DIAGNOSIS — R938 Abnormal findings on diagnostic imaging of other specified body structures: Secondary | ICD-10-CM | POA: Insufficient documentation

## 2016-04-24 DIAGNOSIS — M2391 Unspecified internal derangement of right knee: Secondary | ICD-10-CM | POA: Diagnosis not present

## 2016-04-24 NOTE — Assessment & Plan Note (Signed)
Check TSH and start back on thyroid medicine; adjust dose if needed; under-replaced thyroid disease could exacerbate mood and result in the weight gain noted

## 2016-04-27 NOTE — Progress Notes (Deleted)
[facility] Consultation      Assessment and Plan:  Obstructive sleep apnea. -The patient has a history of OSA, was previously on CPAP, but was taken away, I presume this is because of inadequate use. We'll therefore need to send her for another sleep study in order to requalify her CPAP.  Excessive daytime sleepiness. -We will send for sleep study, symptoms are consistent with obstructive sleep apnea.  Snoring.  -Snoring and witnessed apneas, likely due to obstructive sleep apnea.  GERD.  -GERD, can be contributed to by objective sleep apnea, therefore, it will be important to monitor this. Going forwards.  Chronic rhinitis.  -Does have sinus drainage, we discussed the possibility of starting on Flonase. If this becomes a hindrance in terms of tolerance of CPAP.  Date: 04/27/2016  MRN# [id number] Kristina Reed 1966/09/08  Referring Physician: Dr. Nicanor Alcon Kristina Reed is a 50 y.o. old female seen in consultation for chief complaint of:    No chief complaint on file.   HPI:   The patient is a 50 year old female with a history of sleep apnea. She goes to bed between 8:30 and 9 PM, falls asleep within approximately one hour. She wakes up about 3 times per night. She usually gets out of bed to start her day at 6 AM. Epworth score is 18 today.  She is known to have a history of OSA, she stopped using it, but she can no answer why she could no longer use it. She comes in now as she as she has a new physician and though this should be revisited. Her boyfriend notes that she breathes "kinda hard" when she is sleeping, and has to wake her up.  She thinks that her sleep study was about 3 years ago, not sure where. Her machine was taken away, likely due to noncompliance.   She has reflux symptoms when eating spicy foods. She has runny nose, no pets at home.   Sleep study tracings reviewed from 03/11/16: Sleep efficiency was 91%, initial sleep latency 12 minutes consistent  with excessive sleepiness. AHI 32, consistent with severe OSA. Frequent leg movements with respiratory events, no PLMS. Subsequently sent for auto-Pap.  Allergies:  Percocet [oxycodone-acetaminophen]  Review of Systems: Gen:  Denies  fever, sweats, chills HEENT: Denies blurred vision, double vision. bleeds, sore throat Cvc:  No dizziness, chest pain. Resp:   Denies cough or sputum production, shortness of breath Gi: Denies swallowing difficulty, stomach pain. Gu:  Denies bladder incontinence, burning urine Ext:   No Joint pain, stiffness. Skin: No skin rash,  hives  Endoc:  No polyuria, polydipsia. Psych: No depression, insomnia. Other:  All other systems were reviewed with the patient and were negative other that what is mentioned in the HPI.   Physical Examination:   VS: LMP 05/09/1993 (Approximate)   General Appearance: No distress  Neuro:without focal findings,  speech normal,  HEENT: PERRLA, EOM intact.   Pulmonary: normal breath sounds, No wheezing.  CardiovascularNormal S1,S2.  No m/r/g.   Abdomen: Benign, Soft, non-tender. Renal:  No costovertebral tenderness  GU:  No performed at this time. Endoc: No evident thyromegaly, no signs of acromegaly. Skin:   warm, no rashes, no ecchymosis  Extremities: normal, no cyanosis, clubbing.  Other findings:    LABORATORY PANEL:   CBC No results for input(s): WBC, HGB, HCT, PLT in the last 168 hours. ------------------------------------------------------------------------------------------------------------------  Chemistries  No results for input(s): NA, K, CL, CO2, GLUCOSE, BUN, CREATININE, CALCIUM, MG,  AST, ALT, ALKPHOS, BILITOT in the last 168 hours.  Invalid input(s): GFRCGP ------------------------------------------------------------------------------------------------------------------  Cardiac Enzymes No results for input(s): TROPONINI in the last 168  hours. ------------------------------------------------------------  RADIOLOGY:  No results found.     Thank  you for the consultation and for allowing [facility] to assist in the care of your patient. Our recommendations are noted above.  Please contact us if we can be of further service.   Marda Stalker, MD.  Board Certified in Internal Medicine, Pulmonary Medicine, Index, and Sleep Medicine.  [facility] Office Number: [phone]  Patricia Pesa, M.D.  Vilinda Boehringer, M.D.  Merton Border, M.D  04/27/2016

## 2016-04-28 NOTE — Telephone Encounter (Signed)
COMPLETED

## 2016-04-30 ENCOUNTER — Ambulatory Visit: Payer: Medicaid Other | Admitting: Internal Medicine

## 2016-05-04 ENCOUNTER — Encounter: Payer: Medicaid Other | Admitting: Family Medicine

## 2016-05-08 ENCOUNTER — Telehealth: Payer: Self-pay | Admitting: Family Medicine

## 2016-05-08 NOTE — Telephone Encounter (Signed)
We did not order the MRI, so we'll ask her to contact the ordering provider; thank you

## 2016-05-08 NOTE — Telephone Encounter (Signed)
Pt.notified

## 2016-05-20 ENCOUNTER — Ambulatory Visit: Payer: Medicaid Other | Admitting: Family Medicine

## 2016-06-17 ENCOUNTER — Encounter: Payer: Medicaid Other | Admitting: Family Medicine

## 2016-07-14 ENCOUNTER — Inpatient Hospital Stay: Admission: RE | Admit: 2016-07-14 | Payer: Medicaid Other | Source: Ambulatory Visit

## 2016-07-16 ENCOUNTER — Encounter
Admission: RE | Admit: 2016-07-16 | Discharge: 2016-07-16 | Disposition: A | Discharge status: Home / Self Care | Payer: Medicaid Other | Source: Ambulatory Visit | Attending: Unknown Physician Specialty | Admitting: Unknown Physician Specialty

## 2016-07-16 DIAGNOSIS — Z01812 Encounter for preprocedural laboratory examination: Secondary | ICD-10-CM | POA: Insufficient documentation

## 2016-07-16 DIAGNOSIS — Z01810 Encounter for preprocedural cardiovascular examination: Secondary | ICD-10-CM | POA: Diagnosis present

## 2016-07-16 DIAGNOSIS — I10 Essential (primary) hypertension: Secondary | ICD-10-CM | POA: Insufficient documentation

## 2016-07-16 HISTORY — DX: Essential (primary) hypertension: I10

## 2016-07-16 HISTORY — DX: Major depressive disorder, single episode, unspecified: F32.9

## 2016-07-16 HISTORY — DX: Anxiety disorder, unspecified: F41.9

## 2016-07-16 HISTORY — DX: Diverticulosis of intestine, part unspecified, without perforation or abscess without bleeding: K57.90

## 2016-07-16 HISTORY — DX: Sleep apnea, unspecified: G47.30

## 2016-07-16 HISTORY — DX: Depression, unspecified: F32.A

## 2016-07-16 HISTORY — DX: Hypothyroidism, unspecified: E03.9

## 2016-07-16 HISTORY — DX: Mild cognitive impairment of uncertain or unknown etiology: G31.84

## 2016-07-16 LAB — DIFFERENTIAL
BASOS ABS: 0 10*3/uL (ref 0–0.1)
BASOS PCT: 1 %
Eosinophils Absolute: 0.2 10*3/uL (ref 0–0.7)
Eosinophils Relative: 3 %
Lymphocytes Relative: 30 %
Lymphs Abs: 1.6 10*3/uL (ref 1.0–3.6)
MONOS PCT: 7 %
Monocytes Absolute: 0.4 10*3/uL (ref 0.2–0.9)
NEUTROS ABS: 3.3 10*3/uL (ref 1.4–6.5)
Neutrophils Relative %: 59 %

## 2016-07-16 LAB — CBC
HEMATOCRIT: 36.6 % (ref 35.0–47.0)
HEMOGLOBIN: 12.8 g/dL (ref 12.0–16.0)
MCH: 29.9 pg (ref 26.0–34.0)
MCHC: 35 g/dL (ref 32.0–36.0)
MCV: 85.4 fL (ref 80.0–100.0)
Platelets: 173 10*3/uL (ref 150–440)
RBC: 4.29 MIL/uL (ref 3.80–5.20)
RDW: 13.5 % (ref 11.5–14.5)
WBC: 5.4 10*3/uL (ref 3.6–11.0)
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
## 2016-07-16 NOTE — Patient Instructions (Signed)
  Your procedure is scheduled [id number] 8, 2017 (Wednesday) Report to Same Day Surgery 2nd floor medical mall To find out your arrival time please call [phone] between 1PM - 3PM on July 21, 2016 (Tuesday)  Remember: Instructions that are not followed completely may result in serious medical risk, up to and including death, or upon the discretion of your surgeon and anesthesiologist your surgery may need to be rescheduled.    _x___ 1. Do not eat food or drink liquids after midnight. No gum chewing or hard candies.     __x__ 2. No Alcohol for 24 hours before or after surgery.   __x__3. No Smoking for 24 prior to surgery.   ____  4. Bring all medications with you on the day of surgery if instructed.    __x__ 5. Notify your doctor if there is any change in your medical condition     (cold, fever, infections).     Do not wear jewelry, make-up, hairpins, clips or nail polish.  Do not wear lotions, powders, or perfumes. You may wear deodorant.  Do not shave 48 hours prior to surgery. Men may shave face and neck.  Do not bring valuables to the hospital.    [facility] is not responsible for any belongings or valuables.               Contacts, dentures or bridgework may not be worn into surgery.  Leave your suitcase in the car. After surgery it may be brought to your room.  For patients admitted to the hospital, discharge time is determined by your treatment team.   Patients discharged the day of surgery will not be allowed to drive home.    Please read over the following fact sheets that you were given:   [facility] Preparing for Surgery and or MRSA Information   ____ Take these medicines the morning of surgery with A SIP OF WATER:    1.   2.  3.  4.  5.  6.  ____Fleets enema or Magnesium Citrate as directed.   _x___ Use CHG Soap or sage wipes as directed on instruction sheet   ____ Use inhalers on the day of surgery and bring to hospital day of surgery  ____ Stop  metformin 2 days prior to surgery    ____ Take 1/2 of usual insulin dose the night before surgery and none on the morning of           surgery.   __x__ Stop aspirin or coumadin, or plavix (NO ASPIRIN)  x__ Stop Anti-inflammatories such as Advil, Aleve, Ibuprofen, Motrin, Naproxen,          Naprosyn, Goodies powders or aspirin products. Ok to take Tylenol.   ____ Stop supplements until after surgery.    ____ Bring C-Pap to the hospital.

## 2016-07-16 NOTE — Pre-Procedure Instructions (Signed)
Case manager, Jeani Hawking in to assess patient with social issues.

## 2016-07-17 ENCOUNTER — Other Ambulatory Visit: Payer: Self-pay

## 2016-07-17 DIAGNOSIS — F331 Major depressive disorder, recurrent, moderate: Secondary | ICD-10-CM

## 2016-07-17 MED ORDER — ESCITALOPRAM OXALATE 10 MG PO TABS: 10.0000 mg | ORAL_TABLET | Freq: Every day | ORAL | 0 refills | Status: DC

## 2016-07-22 ENCOUNTER — Ambulatory Visit: Payer: Medicaid Other | Admitting: Anesthesiology

## 2016-07-22 ENCOUNTER — Encounter
Admission: RE | Disposition: A | Discharge status: Home / Self Care | Payer: Self-pay | Source: Ambulatory Visit | Attending: Unknown Physician Specialty

## 2016-07-22 ENCOUNTER — Encounter: Payer: Self-pay | Admitting: *Deleted

## 2016-07-22 ENCOUNTER — Ambulatory Visit
Admission: RE | Admit: 2016-07-22 | Discharge: 2016-07-22 | Disposition: A | Discharge status: Home / Self Care | Payer: Medicaid Other | Source: Ambulatory Visit | Attending: Unknown Physician Specialty | Admitting: Unknown Physician Specialty

## 2016-07-22 DIAGNOSIS — Z833 Family history of diabetes mellitus: Secondary | ICD-10-CM | POA: Diagnosis not present

## 2016-07-22 DIAGNOSIS — I10 Essential (primary) hypertension: Secondary | ICD-10-CM | POA: Insufficient documentation

## 2016-07-22 DIAGNOSIS — Z803 Family history of malignant neoplasm of breast: Secondary | ICD-10-CM | POA: Diagnosis not present

## 2016-07-22 DIAGNOSIS — K5790 Diverticulosis of intestine, part unspecified, without perforation or abscess without bleeding: Secondary | ICD-10-CM | POA: Insufficient documentation

## 2016-07-22 DIAGNOSIS — Z885 Allergy status to narcotic agent status: Secondary | ICD-10-CM | POA: Diagnosis not present

## 2016-07-22 DIAGNOSIS — M1990 Unspecified osteoarthritis, unspecified site: Secondary | ICD-10-CM | POA: Insufficient documentation

## 2016-07-22 DIAGNOSIS — F418 Other specified anxiety disorders: Secondary | ICD-10-CM | POA: Insufficient documentation

## 2016-07-22 DIAGNOSIS — Z90710 Acquired absence of both cervix and uterus: Secondary | ICD-10-CM | POA: Diagnosis not present

## 2016-07-22 DIAGNOSIS — F419 Anxiety disorder, unspecified: Secondary | ICD-10-CM | POA: Diagnosis not present

## 2016-07-22 DIAGNOSIS — N289 Disorder of kidney and ureter, unspecified: Secondary | ICD-10-CM | POA: Diagnosis not present

## 2016-07-22 DIAGNOSIS — K219 Gastro-esophageal reflux disease without esophagitis: Secondary | ICD-10-CM | POA: Insufficient documentation

## 2016-07-22 DIAGNOSIS — F329 Major depressive disorder, single episode, unspecified: Secondary | ICD-10-CM | POA: Diagnosis not present

## 2016-07-22 DIAGNOSIS — G4730 Sleep apnea, unspecified: Secondary | ICD-10-CM | POA: Diagnosis not present

## 2016-07-22 DIAGNOSIS — S83281A Other tear of lateral meniscus, current injury, right knee, initial encounter: Secondary | ICD-10-CM | POA: Diagnosis present

## 2016-07-22 DIAGNOSIS — L940 Localized scleroderma [morphea]: Secondary | ICD-10-CM | POA: Diagnosis not present

## 2016-07-22 DIAGNOSIS — E039 Hypothyroidism, unspecified: Secondary | ICD-10-CM | POA: Insufficient documentation

## 2016-07-22 DIAGNOSIS — Z79899 Other long term (current) drug therapy: Secondary | ICD-10-CM | POA: Diagnosis not present

## 2016-07-22 DIAGNOSIS — K648 Other hemorrhoids: Secondary | ICD-10-CM | POA: Insufficient documentation

## 2016-07-22 DIAGNOSIS — Z8542 Personal history of malignant neoplasm of other parts of uterus: Secondary | ICD-10-CM | POA: Insufficient documentation

## 2016-07-22 DIAGNOSIS — M23200 Derangement of unspecified lateral meniscus due to old tear or injury, right knee: Secondary | ICD-10-CM | POA: Diagnosis not present

## 2016-07-22 DIAGNOSIS — K644 Residual hemorrhoidal skin tags: Secondary | ICD-10-CM | POA: Diagnosis not present

## 2016-07-22 DIAGNOSIS — G5750 Tarsal tunnel syndrome, unspecified lower limb: Secondary | ICD-10-CM | POA: Insufficient documentation

## 2016-07-22 HISTORY — PX: KNEE ARTHROSCOPY: SHX127

## 2016-07-22 SURGERY — ARTHROSCOPY, KNEE
Anesthesia: General | Laterality: Right | Wound class: Clean

## 2016-07-22 MED ORDER — BUPIVACAINE HCL (PF) 0.5 % IJ SOLN
INTRAMUSCULAR | Status: AC
  Filled 2016-07-22: qty 30

## 2016-07-22 MED ORDER — BUPIVACAINE HCL (PF) 0.5 % IJ SOLN
INTRAMUSCULAR | Status: DC | PRN
  Administered 2016-07-22: 13 mL

## 2016-07-22 MED ORDER — DEXAMETHASONE SODIUM PHOSPHATE 10 MG/ML IJ SOLN
INTRAMUSCULAR | Status: DC | PRN
  Administered 2016-07-22: 10 mg via INTRAVENOUS

## 2016-07-22 MED ORDER — LACTATED RINGERS IV SOLN
INTRAVENOUS | Status: DC | PRN
Start: 2016-07-22 — End: 2016-07-22
  Administered 2016-07-22: 09:00:00 via INTRAVENOUS

## 2016-07-22 MED ORDER — FAMOTIDINE 20 MG PO TABS
ORAL_TABLET | ORAL | Status: AC
  Filled 2016-07-22: qty 1

## 2016-07-22 MED ORDER — GLYCOPYRROLATE 0.2 MG/ML IJ SOLN
INTRAMUSCULAR | Status: DC | PRN
  Administered 2016-07-22: .2 mg via INTRAVENOUS

## 2016-07-22 MED ORDER — FENTANYL CITRATE (PF) 100 MCG/2ML IJ SOLN
INTRAMUSCULAR | Status: AC
  Administered 2016-07-22: 25 ug via INTRAVENOUS
  Filled 2016-07-22: qty 2

## 2016-07-22 MED ORDER — FAMOTIDINE 20 MG PO TABS
20.0000 mg | ORAL_TABLET | Freq: Once | ORAL | Status: AC
  Administered 2016-07-22: 20 mg via ORAL

## 2016-07-22 MED ORDER — PROPOFOL 10 MG/ML IV BOLUS
INTRAVENOUS | Status: DC | PRN
  Administered 2016-07-22: 140 mg via INTRAVENOUS

## 2016-07-22 MED ORDER — MIDAZOLAM HCL 2 MG/2ML IJ SOLN
INTRAMUSCULAR | Status: DC | PRN
  Administered 2016-07-22: 2 mg via INTRAVENOUS

## 2016-07-22 MED ORDER — FENTANYL CITRATE (PF) 100 MCG/2ML IJ SOLN
INTRAMUSCULAR | Status: DC | PRN
  Administered 2016-07-22 (×2): 50 ug via INTRAVENOUS

## 2016-07-22 MED ORDER — FENTANYL CITRATE (PF) 100 MCG/2ML IJ SOLN
25.0000 ug | INTRAMUSCULAR | Status: DC | PRN
  Administered 2016-07-22 (×2): 25 ug via INTRAVENOUS

## 2016-07-22 MED ORDER — ONDANSETRON HCL 4 MG/2ML IJ SOLN: 4.0000 mg | Freq: Once | INTRAMUSCULAR | Status: DC | PRN

## 2016-07-22 MED ORDER — ONDANSETRON HCL 4 MG/2ML IJ SOLN
INTRAMUSCULAR | Status: DC | PRN
  Administered 2016-07-22: 4 mg via INTRAVENOUS

## 2016-07-22 MED ORDER — ONDANSETRON HCL 4 MG/2ML IJ SOLN: 4.0000 mg | Freq: Once | INTRAMUSCULAR | 0 refills | Status: DC | PRN

## 2016-07-22 MED ORDER — LACTATED RINGERS IV SOLN
INTRAVENOUS | Status: DC
  Administered 2016-07-22: 08:00:00 via INTRAVENOUS

## 2016-07-22 MED ORDER — LIDOCAINE HCL (CARDIAC) 20 MG/ML IV SOLN
INTRAVENOUS | Status: DC | PRN
  Administered 2016-07-22: 100 mg via INTRAVENOUS

## 2016-07-22 SURGICAL SUPPLY — 39 items
ARTHROWAND PARAGON T2 (SURGICAL WAND)
BLADE ABRADER 4.5 (BLADE) ×4 IMPLANT
BLADE FULL RADIUS 3.5 (BLADE) ×2 IMPLANT
BLADE SHAVER 4.5X7 STR FR (MISCELLANEOUS) IMPLANT
BNDG ESMARK 6X12 TAN STRL LF (GAUZE/BANDAGES/DRESSINGS) ×2 IMPLANT
BUR ABRADER 4.0 W/FLUTE AQUA (MISCELLANEOUS) IMPLANT
BURR ABRADER 4.0 W/FLUTE AQUA (MISCELLANEOUS)
CHLORAPREP W/TINT 26ML (MISCELLANEOUS) ×2 IMPLANT
CUFF TOURN 24 STER (MISCELLANEOUS) IMPLANT
DRAPE LEGGINS SURG 28X43 STRL (DRAPES) ×2 IMPLANT
GAUZE SPONGE 4X4 12PLY STRL (GAUZE/BANDAGES/DRESSINGS) ×2 IMPLANT
GLOVE BIO SURGEON STRL SZ7.5 (GLOVE) ×2 IMPLANT
GLOVE BIO SURGEON STRL SZ8 (GLOVE) ×2 IMPLANT
GLOVE INDICATOR 8.0 STRL GRN (GLOVE) ×2 IMPLANT
GOWN STRL REUS W/ TWL LRG LVL3 (GOWN DISPOSABLE) ×1 IMPLANT
GOWN STRL REUS W/TWL LRG LVL3 (GOWN DISPOSABLE) ×1
GOWN STRL REUS W/TWL LRG LVL4 (GOWN DISPOSABLE) ×2 IMPLANT
IV LACTATED RINGER IRRG 3000ML (IV SOLUTION) ×2
IV LR IRRIG 3000ML ARTHROMATIC (IV SOLUTION) ×2 IMPLANT
KIT RM TURNOVER STRD PROC AR (KITS) ×2 IMPLANT
MANIFOLD 4PT FOR NEPTUNE1 (MISCELLANEOUS) ×2 IMPLANT
PACK ARTHROSCOPY KNEE (MISCELLANEOUS) ×2 IMPLANT
PADDING CAST 4IN STRL (MISCELLANEOUS)
PADDING CAST BLEND 4X4 STRL (MISCELLANEOUS) IMPLANT
SET TUBE SUCT SHAVER OUTFL 24K (TUBING) ×2 IMPLANT
SET TUBE TIP INTRA-ARTICULAR (MISCELLANEOUS) ×2 IMPLANT
SOL PREP PVP 2OZ (MISCELLANEOUS) ×2
SOLUTION PREP PVP 2OZ (MISCELLANEOUS) ×1 IMPLANT
SUT ETH BLK MONO 3 0 FS 1 12/B (SUTURE) ×2 IMPLANT
SUT ETHILON 3-0 FS-10 30 BLK (SUTURE) ×2
SUTURE EHLN 3-0 FS-10 30 BLK (SUTURE) ×1 IMPLANT
TAPE MICROFOAM 4IN (TAPE) ×2 IMPLANT
TUBING ARTHRO INFLOW-ONLY STRL (TUBING) ×2 IMPLANT
WAND 30 DEG SABER W/CORD (SURGICAL WAND) IMPLANT
WAND ARTHRO PARAGON T2 (SURGICAL WAND) IMPLANT
WAND COVAC 50 IFS (MISCELLANEOUS) IMPLANT
WAND HAND CNTRL MULTIVAC 50 (MISCELLANEOUS) IMPLANT
WAND HAND CNTRL MULTIVAC 90 (MISCELLANEOUS) IMPLANT
WRAP KNEE W/COLD PACKS 25.5X14 (SOFTGOODS) ×2 IMPLANT

## 2016-07-22 NOTE — Anesthesia Procedure Notes (Signed)
Procedures

## 2016-07-22 NOTE — Discharge Instructions (Signed)
° °  [facility]  Kathrene Alu., M.D. [phone]   KNEE ARTHROSCOPY POST OPERATION INSTRUCTIONS:  PLEASE READ THESE INSTRUCTIONS ABOUT POST OPERATION CARE. THEY WILL ANSWER MOST OF YOUR QUESTIONS.  You have been given a prescription for pain. Please take as directed for pain.  You can walk, keeping the knee slightly stiff-avoid doing too much bending the first day. (if ACL reconstruction is performed, keep brace locked in extension when walking.)  You will use crutches or cane if needed. Can weight bear as tolerated  Plan to take three to four days off from work. You can resume work when you are comfortable. (This can be a week or more, depending on the type of work you do.)  To reduce pain and swelling, place one to two pillows under the knee the first two or three days when sitting or lying. An ice pack may be placed on top of the area over the dressing. Instructions for making homemade icepack are as follow:  Flexible homemade alcohol water ice pack  2 cups water  1 cup rubbing alcohol  food coloring for the blue tint (optional)  2 zip-top bags - gallon-size  Mix the water and alcohol together in one of your zip-top bags and add food coloring. Release as much air as possible and seal the bag. Place in freezer for at least 12 hours.  The small incisions in your knee are closed with nylon stitches. They will be removed in the office.  The bulky dressing may be removed in the third day after surgery. (If ACL surgery-DO NOT REMOVE BANDAGES). Put a waterproof band-aid over each stitch. Do not put any creams or ointments on wounds. You may shower at this time, but change waterproof band-aids after showering. KEEP INCISIONS CLEAN AND DRY UNTIL YOU RETURN TO THE OFFICE.  Sometimes the operative area remains somewhat painful and swollen for several weeks. This is usually nothing to worry about, but call if you have any excessive symptoms, especially  fever. It is not unusual to have a low grade fever of 99 degrees for the first few days. If persist after 3-4 days call the office. It is not uncommon for the pain to be a little worse on the third day after surgery.  Begin doing gentle exercises right away. They will be limited by the amount of pain and swelling you have.  Exercising will reduce the swelling, increase motion, and prevent muscle weakness. Exercises: Straight leg raising and gentle knee bending.  Take 81 milligram aspirin twice a day for 2 weeks after meals or milk. This along with elevation will help reduce the possibility of phlebitis in your operated leg.  Avoid strenuous athletics for a minimum of 4 to 6 weeks after arthroscopic surgery (approximately five months if ACL surgery).  If the surgery included ACL reconstruction the brace that is supplied to the extremity post surgery is to be locked in extension when you are asleep and is to be locked in extension when you are ambulating. It can be unlocked for exercises or sitting.  Keep your post surgery appointment that has been made for you. If you do not remember the date call [phone]. Your follow up appointment should be between 7-10 days.

## 2016-07-22 NOTE — H&P (Signed)
  H and P reviewed. No changes. Uploaded at later date. 

## 2016-07-22 NOTE — Anesthesia Postprocedure Evaluation (Signed)
Anesthesia Post Note  Patient: Kristina Reed  Procedure(s) Performed: Procedure(s) (LRB): ARTHROSCOPY KNEE debridement microfracture (Right)  Patient location during evaluation: PACU Anesthesia Type: General Level of consciousness: awake and alert Pain management: pain level controlled Vital Signs Assessment: post-procedure vital signs reviewed and stable Respiratory status: spontaneous breathing, nonlabored ventilation, respiratory function stable and patient connected to nasal cannula oxygen Cardiovascular status: blood pressure returned to baseline and stable Postop Assessment: no signs of nausea or vomiting Anesthetic complications: no    Last Vitals:  Vitals:   07/22/16 1215 07/22/16 1224  BP: 126/71 118/77  Pulse: 68 72  Resp: 16 16  Temp: 36.9 C     Last Pain:  Vitals:   07/22/16 1215  TempSrc:   PainSc: 0-No pain                 Aquil Duhe S

## 2016-07-22 NOTE — Transfer of Care (Signed)
Immediate Anesthesia Transfer of Care Note  Patient: Kristina Reed  Procedure(s) Performed: Procedure(s): ARTHROSCOPY KNEE debridement microfracture (Right)  Patient Location: PACU  Anesthesia Type:General  Level of Consciousness: awake, oriented and patient cooperative  Airway & Oxygen Therapy: Patient Spontanous Breathing and Patient connected to face mask oxygen  Post-op Assessment: Report given to RN, Post -op Vital signs reviewed and stable and Patient moving all extremities X 4  Post vital signs: Reviewed and stable  Last Vitals:  Vitals:   07/22/16 0801  BP: (!) 142/99  Pulse: 69  Resp: 16  Temp: 36.7 C    Last Pain:  Vitals:   07/22/16 0801  TempSrc: Oral      Patients Stated Pain Goal: 5 ([id number])  Complications: No apparent anesthesia complications

## 2016-07-22 NOTE — Op Note (Signed)
Patient: Kristina Reed, Kristina Reed.  Preoperative diagnosis: Possible chondral lesions right knee  Postop diagnosis: Lateral compartment chondral lesions right knee  Operation: Arthroscopic debridement and microfracture of lateral femoral chondral lesion  Surgeon: Vilinda Flake, MD  Anesthesia: Gen.   History: Patient's had a long history of right knee pain.  The plain films revealed no significant joint space narrowing .  The patient had an MRI which revealed no significant meniscal or chondral pathology.The patient was scheduled for surgery due to persistent discomfort despite conservative treatment.  The patient was taken the operating room where satisfactory general anesthesia was achieved. A tourniquet and leg holder were was applied to the right thigh. A well leg support was applied to the nonoperative extremity. The right knee was prepped and draped in usual fashion for an arthroscopic procedure. An inflow cannula was introduced superomedially. The joint was distended with lactated Ringer's. Scope was introduced through an inferolateral puncture wound and a probe through an inferomedial puncture wound. Inspection of the medial compartment revealed  no meniscal or chondral pathology. Inspection of the intercondylar notch revealed intact cruciates. Inspection of the the lateral compartment revealed a significant stellate chondral lesion in the anterior aspect of the weightbearing portion of the lateral femoral condyle. The lesion penetrated almost to bone. There were also multiple longitudinal clefts in the articular cartilage of the posterior portion of the lateral tibial plateau. I went ahead and curetted out the lateral femoral chondral lesion down to and through the calcified cartilage layer. The lesion was then microfractured. Trochlear groove was inspected and appeared to be fairly smooth.  Inspection of the retropatellar surface revealed normal articular cartilage. I observed patella tracking  from the inferolateral portal. The patella seemed to track fairly well.  The instruments were removed from the joint at this time. The puncture wounds were closed with 3-0 nylon in vertical mattress fashion. I injected each puncture wound with several cc of half percent Marcaine without epinephrine. Betadine was applied the wounds followed by sterile dressing. An ice pack was applied to the right knee. The patient was awakened and transferred to the stretcher bed. The patient was taken to the recovery room in satisfactory condition.  The tourniquet was not inflated during the course of the procedure. Blood loss was negligible.

## 2016-07-22 NOTE — Anesthesia Preprocedure Evaluation (Addendum)
Anesthesia Evaluation  Patient identified by MRN, date of birth, ID band Patient awake    Reviewed: Allergy & Precautions, NPO status , Patient's Chart, lab work & pertinent test results, reviewed documented beta blocker date and time   Airway Mallampati: II  TM Distance: >3 FB     Dental  (+) Upper Dentures, Lower Dentures   Pulmonary sleep apnea ,           Cardiovascular hypertension, Pt. on medications      Neuro/Psych PSYCHIATRIC DISORDERS Anxiety Depression  Neuromuscular disease    GI/Hepatic GERD  Controlled,  Endo/Other  Hypothyroidism   Renal/GU Renal disease     Musculoskeletal  (+) Arthritis ,   Abdominal   Peds  Hematology   Anesthesia Other Findings Hysterectomy. EKG ok.  Reproductive/Obstetrics                           Anesthesia Physical Anesthesia Plan  ASA: III  Anesthesia Plan: General   Post-op Pain Management:    Induction: Intravenous  Airway Management Planned: LMA  Additional Equipment:   Intra-op Plan:   Post-operative Plan:   Informed Consent: I have reviewed the patients History and Physical, chart, labs and discussed the procedure including the risks, benefits and alternatives for the proposed anesthesia with the patient or authorized representative who has indicated his/her understanding and acceptance.     Plan Discussed with: CRNA  Anesthesia Plan Comments:         Anesthesia Quick Evaluation

## 2016-07-22 NOTE — Anesthesia Procedure Notes (Signed)
Procedure Name: LMA Insertion Date/Time: 07/22/2016 9:36 AM Performed by: Silvana Newness Pre-anesthesia Checklist: Patient identified, Emergency Drugs available, Suction available, Patient being monitored and Timeout performed Patient Re-evaluated:Patient Re-evaluated prior to inductionOxygen Delivery Method: Circle system utilized Preoxygenation: Pre-oxygenation with 100% oxygen Intubation Type: IV induction Ventilation: Mask ventilation without difficulty LMA: LMA inserted LMA Size: 4.0 Number of attempts: 1 Tube secured with: Tape Dental Injury: Teeth and Oropharynx as per pre-operative assessment

## 2016-07-28 ENCOUNTER — Encounter: Payer: Self-pay | Admitting: Unknown Physician Specialty

## 2016-08-17 ENCOUNTER — Telehealth: Payer: Self-pay | Admitting: Family Medicine

## 2016-08-17 DIAGNOSIS — E039 Hypothyroidism, unspecified: Secondary | ICD-10-CM

## 2016-08-17 NOTE — Telephone Encounter (Signed)
Note from pharmacy, [facility] Asked if okay to change thyroid med manufacturer Yes, if we can check a TSH please 8 weeks after she makes the change Please call patient; let her know we'll want her to come by for TSH 8 weeks after the switch; call before then with any problems

## 2016-08-17 NOTE — Assessment & Plan Note (Signed)
Pharmacy wants to switch thyroid med manufacturer; okay, but recheck TSH 8 weeks later

## 2016-08-18 NOTE — Telephone Encounter (Signed)
Done

## 2016-08-24 ENCOUNTER — Ambulatory Visit: Payer: Medicaid Other | Attending: Orthopedic Surgery | Admitting: Physical Therapy

## 2016-08-24 ENCOUNTER — Encounter: Payer: Self-pay | Admitting: Physical Therapy

## 2016-08-24 DIAGNOSIS — M25561 Pain in right knee: Secondary | ICD-10-CM | POA: Diagnosis present

## 2016-08-24 DIAGNOSIS — M25661 Stiffness of right knee, not elsewhere classified: Secondary | ICD-10-CM | POA: Diagnosis present

## 2016-08-24 DIAGNOSIS — G8929 Other chronic pain: Secondary | ICD-10-CM | POA: Insufficient documentation

## 2016-08-24 DIAGNOSIS — R262 Difficulty in walking, not elsewhere classified: Secondary | ICD-10-CM | POA: Insufficient documentation

## 2016-08-24 DIAGNOSIS — M6281 Muscle weakness (generalized): Secondary | ICD-10-CM | POA: Insufficient documentation

## 2016-08-25 NOTE — Therapy (Signed)
[facility] [address] [place], [place], [address] Phone: [phone]   Fax:  [phone]  Physical Therapy Evaluation  Patient Details  Name: Kristina Reed MRN: [id number] Date of Birth: 1965-11-27 Referring Provider: Reche Dixon, PA-C  Encounter Date: 08/24/2016      PT End of Session - 08/25/16 0723    Visit Number 1   Number of Visits 1   Date for PT Re-Evaluation 08/25/16   PT Start Time [id number]   PT Stop Time 1415   PT Time Calculation (min) 52 min   Activity Tolerance Patient limited by pain   Behavior During Therapy [facility] for tasks assessed/performed      Past Medical History:  Diagnosis Date  . Anxiety   . Arthritis    right knee and right elbow  . Cancer ([place])    Cervical CA with partial hysterectomy.  . Cognitive impairment, mild, so stated   . Depression   . Diverticulosis   . Elevated AST (SGOT) 01/13/2016  . Gallstones   . GERD (gastroesophageal reflux disease)   . Hx of cervical cancer 01/30/2016  . Hypertension   . Hypothyroidism   . Menopause 01/30/2016  . Sleep apnea    does not use a C-PAP  . Status post partial hysterectomy    Due to Cervical CA  . Thyroid disease   . Vaginal inclusion cyst     Past Surgical History:  Procedure Laterality Date  . ABDOMINAL HYSTERECTOMY     partial hysterectomy  . BREAST BIOPSY Bilateral 01/01/2016   [name] cores  . COLONOSCOPY WITH PROPOFOL N/A 06/27/2015   Procedure: COLONOSCOPY WITH PROPOFOL;  Surgeon: Josefine Class, MD;  Location[facility] ENDOSCOPY;  Service: Endoscopy;  Laterality: N/A;  . KNEE ARTHROSCOPY Right 07/22/2016   Procedure: ARTHROSCOPY KNEE debridement microfracture;  Surgeon: Leanor Kail, MD;  Location: [facility] ORS;  Service: Orthopedics;  Laterality: Right;  . TUBAL LIGATION      There were no vitals filed for this visit.       Subjective Assessment - 08/25/16 0717    Subjective Pt. s/p R knee scope on 07/22/16 due to torn cartilage.  Pt.  states she has received cortisone injections and tried Tramadol with no benefit.  Pt. currently reports 6/10 R knee pain at best and >10/10 at worst with knee flexion/ increase activity.     Pertinent History pt. does not work and enjoys watching TV.  Pt. lives with mother and has stairs or ramp access to enter home.     Limitations Sitting;Lifting;Standing;Walking;House hold activities   Patient Stated Goals Increase R knee ROM/ strength.  Decrease pain.     Currently in Pain? Yes   Pain Score 6    Pain Location Knee   Pain Orientation Right   Pain Descriptors / Indicators Aching;Constant;[place] Ard      See HEP       PT Education - 08/24/16 1421    Education provided Yes   Education Details See HEP   Person(s) Educated Patient   Methods Explanation;Handout;Demonstration   Comprehension Verbalized understanding;Returned demonstration           Plan - 08/25/16 0724    Clinical Impression Statement Pt. is a pleasant 50 y/o female with chronic R knee pain resulting in R knee scope on 07/22/16.  Pt. reports persistent R knee joint line pain at best/rest 6/10 and worst >10/10 with knee flexion/ increase activity.  Pt. reports no improvement in knee pain with  ice, medications or cortisone injections.  L knee AROM (0-132 deg.) and strength grossly 4+/5 MMT.  R knee AROM (-2 to 112 deg.)- pain limited with R knee flexion >75 deg. AROM in supine position.  R knee PROM (0 to 126 deg.)- >10/10 pain.  No swelling in R knee joint line or lower leg as compared to L.  R knee/LE muscle strength grossly 4/5 MMT with increase pain during resisted knee extension/flexion.  Pt. ambulates with moderate R antalgic gait pattern with and without use of SPC.   Pt. limited to 1 PT visit due to Medicaid limitations and pt. issued stretching/ strengthening HEP.  Pt. instructed to contact PT over next several weeks if any increase issues.    Rehab Potential Fair   PT Frequency 1x / week   PT Treatment/Interventions  ADLs/Self Care Home Management;Cryotherapy;Moist Heat;Gait training;Stair training;Functional mobility training;Therapeutic activities;Therapeutic exercise;Neuromuscular re-education;Balance training;Manual techniques;Patient/family education;Passive range of motion   PT Next Visit Plan Issued HEP      Patient will benefit from skilled therapeutic intervention in order to improve the following deficits and impairments:  Abnormal gait, Pain, Hypomobility, Decreased strength, Decreased range of motion, Decreased endurance, Decreased activity tolerance, Difficulty walking  Visit Diagnosis: Chronic pain of right knee  Muscle weakness (generalized)  Joint stiffness of knee, right  Difficulty in walking, not elsewhere classified     Problem List Patient Active Problem List   Diagnosis Date Noted  . Hematuria 04/23/2016  . Pyuria 04/21/2016  . Hx of cervical cancer 01/30/2016  . Menopause 01/30/2016  . Knee pain, right 01/13/2016  . Status post bilateral breast biopsy 01/07/2016  . Chronic nausea 10/25/2015  . Screening for STD (sexually transmitted disease) 10/25/2015  . Mass of right breast on mammogram 05/03/2015  . External hemorrhoid 05/03/2015  . Rectal bleeding 04/30/2015  . Right knee pain 07/23/2014  . Depression, major, recurrent, moderate ([place]) 02/02/2014  . GERD without esophagitis 02/02/2014  . Adult hypothyroidism 02/02/2014  . Obstructive apnea 02/02/2014  . Incomplete bladder emptying 12/12/2012  . Tenosynovitis of foot 05/30/2012  . Benign neoplasm of kidney 05/13/2012  . Urge incontinence 05/13/2012  . FOM (frequency of micturition) 05/13/2012  . Chronic pain of right hand 05/05/2012  . Tarsal tunnel syndrome 03/03/2012   Pura Spice, PT, DPT # [phone]  08/25/2016, 7:32 AM  [facility] [address] [place], [place], [address] Phone: [phone]   Fax:  [phone]  Name: Kristina Reed MRN:  [id number] Date of Birth: [birth year]

## 2016-09-02 ENCOUNTER — Other Ambulatory Visit: Payer: Self-pay | Admitting: Family Medicine

## 2016-09-02 ENCOUNTER — Encounter: Payer: Self-pay | Admitting: Family Medicine

## 2016-09-02 ENCOUNTER — Ambulatory Visit (INDEPENDENT_AMBULATORY_CARE_PROVIDER_SITE_OTHER): Payer: Medicaid Other | Admitting: Family Medicine

## 2016-09-02 VITALS — BP 128/78 | HR 82 | Temp 98.3°F | Resp 16 | Ht 60.5 in | Wt 163.0 lb

## 2016-09-02 DIAGNOSIS — N898 Other specified noninflammatory disorders of vagina: Secondary | ICD-10-CM

## 2016-09-02 DIAGNOSIS — Z23 Encounter for immunization: Secondary | ICD-10-CM

## 2016-09-02 DIAGNOSIS — Z0000 Encounter for general adult medical examination without abnormal findings: Secondary | ICD-10-CM | POA: Diagnosis not present

## 2016-09-02 DIAGNOSIS — Z01411 Encounter for gynecological examination (general) (routine) with abnormal findings: Secondary | ICD-10-CM | POA: Diagnosis not present

## 2016-09-02 LAB — COMPLETE METABOLIC PANEL WITH GFR
ALK PHOS: 57 U/L (ref 33–115)
ALT: 29 U/L (ref 6–29)
AST: 29 U/L (ref 10–35)
Albumin: 3.7 g/dL (ref 3.6–5.1)
BUN: 7 mg/dL (ref 7–25)
CALCIUM: 8.6 mg/dL (ref 8.6–10.2)
CO2: 26 mmol/L (ref 20–31)
Chloride: 102 mmol/L (ref 98–110)
Creat: 0.7 mg/dL (ref 0.50–1.10)
GFR, Est African American: >89 mL/min (ref >=60)
Glucose, Bld: 75 mg/dL (ref 65–99)
POTASSIUM: 3.7 mmol/L (ref 3.5–5.3)
Sodium: 137 mmol/L (ref 135–146)
Total Bilirubin: 0.4 mg/dL (ref 0.2–1.2)
Total Protein: 5.9 g/dL — ABNORMAL LOW (ref 6.1–8.1)

## 2016-09-02 LAB — CBC WITH DIFFERENTIAL/PLATELET
Basophils Absolute: 53 cells/uL (ref 0–200)
Basophils Relative: 1 %
EOS PCT: 7 %
Eosinophils Absolute: 371 cells/uL (ref 15–500)
HCT: 36.9 % (ref 35.0–45.0)
HEMOGLOBIN: 11.9 g/dL (ref 11.7–15.5)
LYMPHS ABS: 2067 {cells}/uL (ref 850–3900)
Lymphocytes Relative: 39 %
MCH: 28.4 pg (ref 27.0–33.0)
MCHC: 32.2 g/dL (ref 32.0–36.0)
MCV: 88.1 fL (ref 80.0–100.0)
MPV: 9.7 fL (ref 7.5–12.5)
Monocytes Absolute: 318 cells/uL (ref 200–950)
Monocytes Relative: 6 %
NEUTROS PCT: 47 %
Neutro Abs: 2491 cells/uL (ref 1500–7800)
PLATELETS: 165 10*3/uL (ref 140–400)
RBC: 4.19 MIL/uL (ref 3.80–5.10)
RDW: 13.6 % (ref 11.0–15.0)
WBC: 5.3 10*3/uL (ref 3.8–10.8)

## 2016-09-02 LAB — TSH: TSH: 1.1 m[IU]/L

## 2016-09-03 ENCOUNTER — Other Ambulatory Visit: Payer: Self-pay | Admitting: Family Medicine

## 2016-09-03 LAB — LIPID PANEL
Cholesterol: 162 mg/dL (ref <200)
HDL: 54 mg/dL (ref >50)
LDL CALC: 82 mg/dL (ref <100)
Total CHOL/HDL Ratio: 3 Ratio (ref <5.0)
Triglycerides: 129 mg/dL (ref <150)
VLDL: 26 mg/dL (ref <30)

## 2016-09-03 LAB — WET PREP BY MOLECULAR PROBE
Candida species: NEGATIVE
GARDNERELLA VAGINALIS: POSITIVE — AB
Trichomonas vaginosis: NEGATIVE

## 2016-09-03 LAB — PAP IG AND HPV HIGH-RISK: HPV DNA HIGH RISK: NOT DETECTED

## 2016-09-03 MED ORDER — METRONIDAZOLE 500 MG PO TABS: 500.0000 mg | ORAL_TABLET | Freq: Two times a day (BID) | ORAL | 0 refills | Status: AC

## 2016-09-03 NOTE — Progress Notes (Signed)
Rx for BV sent 

## 2016-09-10 ENCOUNTER — Ambulatory Visit (HOSPITAL_COMMUNITY)
Admission: EM | Admit: 2016-09-10 | Discharge: 2016-09-10 | Disposition: A | Discharge status: Home / Self Care | Payer: Medicaid Other | Attending: Family Medicine | Admitting: Family Medicine

## 2016-09-10 ENCOUNTER — Encounter (HOSPITAL_COMMUNITY): Payer: Self-pay | Admitting: *Deleted

## 2016-09-10 DIAGNOSIS — J069 Acute upper respiratory infection, unspecified: Secondary | ICD-10-CM | POA: Diagnosis not present

## 2016-09-10 MED ORDER — IPRATROPIUM BROMIDE 0.06 % NA SOLN: 2.0000 | Freq: Four times a day (QID) | NASAL | 1 refills | Status: DC

## 2016-09-10 MED ORDER — DEXTROMETHORPHAN-GUAIFENESIN 5-100 MG/5ML PO LIQD: 10.0000 mL | Freq: Two times a day (BID) | ORAL | 0 refills | Status: DC

## 2016-09-10 NOTE — Discharge Instructions (Signed)
Drink plenty of fluids as discussed, use medicine as prescribed, and mucinex or delsym for cough. Return or see your doctor if further problems °

## 2016-09-10 NOTE — ED Provider Notes (Signed)
[facility]    CSN: [id number] Arrival date & time: 09/10/16  1600     History   Chief Complaint Chief Complaint  Patient presents with  . Cough  . Nasal Congestion    HPI Kristina Reed is a 50 y.o. female.   The history is provided by the patient.  Cough  Cough characteristics:  Non-productive Severity:  Mild Onset quality:  Gradual Duration:  3 days Progression:  Unchanged Chronicity:  New Smoker: no   Context: sick contacts, upper respiratory infection and weather changes   Relieved by:  None tried Associated symptoms: rhinorrhea and sinus congestion   Associated symptoms: no chills and no fever     Past Medical History:  Diagnosis Date  . Anxiety   . Arthritis    right knee and right elbow  . Cancer ([place])    Cervical CA with partial hysterectomy.  . Cognitive impairment, mild, so stated   . Depression   . Diverticulosis   . Elevated AST (SGOT) 01/13/2016  . Gallstones   . GERD (gastroesophageal reflux disease)   . Hx of cervical cancer 01/30/2016  . Hypertension   . Hypothyroidism   . Menopause 01/30/2016  . Sleep apnea    does not use a C-PAP  . Status post partial hysterectomy    Due to Cervical CA  . Thyroid disease   . Vaginal inclusion cyst     Patient Active Problem List   Diagnosis Date Noted  . Hematuria 04/23/2016  . Pyuria 04/21/2016  . Hx of cervical cancer 01/30/2016  . Menopause 01/30/2016  . Knee pain, right 01/13/2016  . Status post bilateral breast biopsy 01/07/2016  . Chronic nausea 10/25/2015  . Screening for STD (sexually transmitted disease) 10/25/2015  . Mass of right breast on mammogram 05/03/2015  . External hemorrhoid 05/03/2015  . Rectal bleeding 04/30/2015  . Right knee pain 07/23/2014  . Depression, major, recurrent, moderate ([place]) 02/02/2014  . GERD without esophagitis 02/02/2014  . Adult hypothyroidism 02/02/2014  . Obstructive apnea 02/02/2014  . Incomplete bladder emptying 12/12/2012  .  Tenosynovitis of foot 05/30/2012  . Benign neoplasm of kidney 05/13/2012  . Urge incontinence 05/13/2012  . FOM (frequency of micturition) 05/13/2012  . Chronic pain of right hand 05/05/2012  . Tarsal tunnel syndrome 03/03/2012    Past Surgical History:  Procedure Laterality Date  . ABDOMINAL HYSTERECTOMY     partial hysterectomy  . BREAST BIOPSY Bilateral 01/01/2016   [name] cores  . COLONOSCOPY WITH PROPOFOL N/A 06/27/2015   Procedure: COLONOSCOPY WITH PROPOFOL;  Surgeon: Josefine Class, MD;  Location: [facility] ENDOSCOPY;  Service: Endoscopy;  Laterality: N/A;  . KNEE ARTHROSCOPY Right 07/22/2016   Procedure: ARTHROSCOPY KNEE debridement microfracture;  Surgeon: Leanor Kail, MD;  Location: [facility] ORS;  Service: Orthopedics;  Laterality: Right;  . TUBAL LIGATION      OB History    No data available       Home Medications    Prior to Admission medications   Medication Sig Start Date End Date Taking? Authorizing Provider  levothyroxine (SYNTHROID, LEVOTHROID) 75 MCG tablet Take 1 tablet (75 mcg total) by mouth daily. 04/21/16  Yes [name], MD  meloxicam (MOBIC) 15 MG tablet Take 15 mg by mouth daily.   Yes Historical Provider, MD  Aspirin-Salicylamide-Caffeine (BC FAST PAIN RELIEF) 650-195-33.3 MG PACK Take 1 packet by mouth every 6 (six) hours as needed (For pain.). *Do NOT exceed 4 packets in 24 hrs.*  Historical Provider, MD  escitalopram (LEXAPRO) 10 MG tablet Take 1 tablet (10 mg total) by mouth daily. Patient not taking: Reported on 09/02/2016 07/17/16   [name], MD  metroNIDAZOLE (FLAGYL) 500 MG tablet Take 1 tablet (500 mg total) by mouth 2 (two) times daily. No alcohol or cold medicines that contain alcohol while on this med 09/03/16 09/10/16  [name], MD  Multiple Vitamins-Minerals (WOMENS MULTI PO) Take 1 tablet by mouth daily. Multivitamin for Women    Historical Provider, MD  ondansetron (ZOFRAN) 4 MG/2ML SOLN injection Inject 2 mLs (4 mg total) into  the vein once as needed for nausea. Patient not taking: Reported on 09/02/2016 07/22/16   Leanor Kail, MD    Family History Family History  Problem Relation Age of Onset  . Diabetes Mother   . Breast cancer Maternal Aunt     Social History Social History  Substance Use Topics  . Smoking status: Never Smoker  . Smokeless tobacco: Never Used  . Alcohol use No     Allergies   Percocet [oxycodone-acetaminophen]   Review of Systems Review of Systems  Constitutional: Negative.  Negative for chills and fever.  HENT: Positive for congestion, postnasal drip and rhinorrhea.   Respiratory: Positive for cough.   Cardiovascular: Negative.   All other systems reviewed and are negative.    Physical Exam Triage Vital Signs ED Triage Vitals [09/10/16 1735]  Enc Vitals Group     BP 130/80     Pulse Rate 68     Resp 16     Temp 98.9 F (37.2 C)     Temp Source Oral     SpO2 100 %     Weight      Height      Head Circumference      Peak Flow      Pain Score      Pain Loc      Pain Edu?      Excl. in [place]?    No data found.   Updated Vital Signs BP 130/80 (BP Location: Left Arm)   Pulse 68   Temp 98.9 F (37.2 C) (Oral)   Resp 16   LMP 05/09/1993 (Approximate)   SpO2 100%   Visual Acuity Right Eye Distance:   Left Eye Distance:   Bilateral Distance:    Right Eye Near:   Left Eye Near:    Bilateral Near:     Physical Exam  Constitutional: She appears well-developed and well-nourished. No distress.  HENT:  Right Ear: External ear normal.  Left Ear: External ear normal.  Nose: Nose normal.  Mouth/Throat: Oropharynx is clear and moist.  Eyes: Pupils are equal, round, and reactive to light.  Neck: Normal range of motion. Neck supple.  Cardiovascular: Normal rate and regular rhythm.   Pulmonary/Chest: Effort normal and breath sounds normal.  Lymphadenopathy:    She has no cervical adenopathy.  Skin: Skin is warm and dry.  Nursing note and vitals  reviewed.    UC Treatments / Results  Labs (all labs ordered are listed, but only abnormal results are displayed) Labs Reviewed - No data to display  EKG  EKG Interpretation None       Radiology No results found.  Procedures Procedures (including critical care time)  Medications Ordered in UC Medications - No data to display   Initial Impression / Assessment and Plan / UC Course  I have reviewed the triage vital signs and the nursing notes.  Pertinent labs & imaging results that were available during my care of the patient were reviewed by me and considered in my medical decision making (see chart for details).  Clinical Course       Final Clinical Impressions(s) / UC Diagnoses   Final diagnoses:  None    New Prescriptions New Prescriptions   No medications on file     [name], MD 09/10/16 2057

## 2016-09-10 NOTE — ED Triage Notes (Signed)
Patient reports nasal congestion and cough since dec 25th. Patient unsure of fevers.

## 2016-09-21 ENCOUNTER — Encounter: Payer: Self-pay | Admitting: Family Medicine

## 2016-09-21 DIAGNOSIS — R7989 Other specified abnormal findings of blood chemistry: Secondary | ICD-10-CM | POA: Insufficient documentation

## 2016-09-21 DIAGNOSIS — Z5181 Encounter for therapeutic drug level monitoring: Secondary | ICD-10-CM | POA: Insufficient documentation

## 2016-09-22 DIAGNOSIS — Z01411 Encounter for gynecological examination (general) (routine) with abnormal findings: Secondary | ICD-10-CM | POA: Insufficient documentation

## 2016-09-22 NOTE — Assessment & Plan Note (Signed)
Refer back to GYN for abnormal appearance of vulva, labia

## 2016-09-22 NOTE — Progress Notes (Signed)
Patient ID: Kristina Reed, female   DOB: [birth year], 51 y.o.   MRN: [id number]   Subjective:   Kristina Reed is a 51 y.o. female here for a complete physical exam  Interim issues since last visit: none reported Computers down at the time of visit with her; note generated to the best of my ability She has not major complaints Here for complete physical She is status post hysterectomy for cervical cancer, but has not had an exam in a long time she reports  Past Medical History:  Diagnosis Date  . Anxiety   . Arthritis    right knee and right elbow  . Cancer ([place])    Cervical CA with partial hysterectomy.  . Cognitive impairment, mild, so stated   . Depression   . Diverticulosis   . Elevated AST (SGOT) 01/13/2016  . Gallstones   . GERD (gastroesophageal reflux disease)   . Hx of cervical cancer 01/30/2016  . Hypertension   . Hypothyroidism   . Menopause 01/30/2016  . Sleep apnea    does not use a C-PAP  . Status post partial hysterectomy    Due to Cervical CA  . Thyroid disease   . Vaginal inclusion cyst    Past Surgical History:  Procedure Laterality Date  . ABDOMINAL HYSTERECTOMY     partial hysterectomy  . BREAST BIOPSY Bilateral 01/01/2016   [name] cores  . COLONOSCOPY WITH PROPOFOL N/A 06/27/2015   Procedure: COLONOSCOPY WITH PROPOFOL;  Surgeon: Josefine Class, MD;  Location[facility] ENDOSCOPY;  Service: Endoscopy;  Laterality: N/A;  . KNEE ARTHROSCOPY Right 07/22/2016   Procedure: ARTHROSCOPY KNEE debridement microfracture;  Surgeon: Leanor Kail, MD;  Location: [facility] ORS;  Service: Orthopedics;  Laterality: Right;  . TUBAL LIGATION     Family History  Problem Relation Age of Onset  . Diabetes Mother   . Breast cancer Maternal Aunt    Social History  Substance Use Topics  . Smoking status: Never Smoker  . Smokeless tobacco: Never Used  . Alcohol use No   Review of Systems  Objective:   Vitals:   09/02/16 0823  BP: 128/78  Pulse: 82  Resp: 16  Temp: 98.3  F (36.8 C)  TempSrc: Oral  SpO2: 97%  Weight: 163 lb 0.7 oz (74 kg)  Height: 5' 0.5" (1.537 m)   Body mass index is 31.32 kg/m. Wt Readings from Last 3 Encounters:  09/02/16 163 lb 0.7 oz (74 kg)  07/22/16 162 lb (73.5 kg)  07/16/16 162 lb (73.5 kg)   Physical Exam  Constitutional: She appears well-developed and well-nourished.  HENT:  Head: Normocephalic and atraumatic.  Eyes: Conjunctivae and EOM are normal. Right eye exhibits no hordeolum. Left eye exhibits no hordeolum. No scleral icterus.  Neck: Carotid bruit is not present. No thyromegaly present.  Cardiovascular: Normal rate, regular rhythm, S1 normal, S2 normal and normal heart sounds.   No extrasystoles are present.  Pulmonary/Chest: Effort normal and breath sounds normal. No respiratory distress. Right breast exhibits no inverted nipple, no mass, no nipple discharge, no skin change and no tenderness. Left breast exhibits no inverted nipple, no mass, no nipple discharge, no skin change and no tenderness. Breasts are symmetrical.  Abdominal: Soft. Normal appearance and bowel sounds are normal. She exhibits no distension, no abdominal bruit, no pulsatile midline mass and no mass. There is no hepatosplenomegaly. There is no tenderness. No hernia.  Genitourinary: Uterus normal. Pelvic exam was performed with patient prone. Cervix exhibits no motion tenderness.  Right adnexum displays no mass, no tenderness and no fullness. Left adnexum displays no mass, no tenderness and no fullness. Vaginal discharge found.  Genitourinary Comments: Abnormal appearance of vulva, labia bilaterally  Musculoskeletal: Normal range of motion. She exhibits no edema.  Lymphadenopathy:       Head (right side): No submandibular adenopathy present.       Head (left side): No submandibular adenopathy present.    She has no cervical adenopathy.    She has no axillary adenopathy.  Neurological: She is alert. She displays no tremor. No cranial nerve deficit.  She exhibits normal muscle tone. Gait normal.  Skin: Skin is warm and dry. No bruising and no ecchymosis noted. No cyanosis. No pallor.  Psychiatric: Her speech is normal and behavior is normal. Thought content normal. Her mood appears not anxious. She does not exhibit a depressed mood.   Assessment/Plan:   Problem List Items Addressed This Visit      Other   Abnormal gynecological examination    Refer back to GYN for abnormal appearance of vulva, labia      Relevant Orders   Ambulatory referral to Gynecology    Other Visit Diagnoses    Routine medical exam    -  Primary   Relevant Orders   COMPLETE METABOLIC PANEL WITH GFR (Completed)   CBC with Differential/Platelet (Completed)   TSH (Completed)   Lipid panel   Pap IG and HPV (high risk) DNA detection   Vaginal discharge       Relevant Orders   WET PREP BY MOLECULAR PROBE (Completed)   Need for pneumococcal vaccination       Relevant Orders   Pneumococcal conjugate vaccine 13-valent (Completed)   Need for Tdap vaccination       Relevant Orders   Tdap vaccine greater than or equal to 7yo IM (Completed)       Meds ordered this encounter  Medications  . meloxicam (MOBIC) 15 MG tablet    Sig: Take 15 mg by mouth daily.   Orders Placed This Encounter  Procedures  . WET PREP BY MOLECULAR PROBE  . Pneumococcal conjugate vaccine 13-valent  . Tdap vaccine greater than or equal to 7yo IM  . COMPLETE METABOLIC PANEL WITH GFR  . CBC with Differential/Platelet  . TSH  . Lipid panel    Standing Status:   Future    Standing Expiration Date:   09/02/2017  . Ambulatory referral to Gynecology    Referral Priority:   Routine    Referral Type:   Consultation    Referral Reason:   Specialty Services Required    Requested Specialty:   Gynecology    Number of Visits Requested:   1    Follow up plan: No Follow-up on file.  An After Visit Summary could not be generated because computers were down

## 2016-10-05 ENCOUNTER — Telehealth: Payer: Self-pay | Admitting: Family Medicine

## 2016-10-05 ENCOUNTER — Other Ambulatory Visit: Payer: Self-pay

## 2016-10-05 MED ORDER — FLUOXETINE HCL 20 MG PO TABS: 20.0000 mg | ORAL_TABLET | Freq: Every day | ORAL | 2 refills | Status: DC

## 2016-10-05 MED ORDER — LEVOTHYROXINE SODIUM 75 MCG PO TABS: 75.0000 ug | ORAL_TABLET | Freq: Every day | ORAL | 5 refills | Status: DC

## 2016-10-05 NOTE — Telephone Encounter (Signed)
Patient is asking to be put back on Fluoxetine as well.

## 2016-10-05 NOTE — Telephone Encounter (Signed)
I approved the thyroid medicine I approved the fluoxetine; please make sure she's doing okay (no thoughts of self-harm or wanting to hurt others); please schedule appt to f/u with me 3 weeks after going back on the fluoxetine She'll want to request refills of the meloxicam from the doctor who prescribed it (we did not prescribe that, as far as I can see) Thank you

## 2016-10-05 NOTE — Telephone Encounter (Signed)
Pt is out of levothyroxine, fluoxetine, meloxicam. Pt is asking that you please send to [facility] [place].

## 2016-10-08 ENCOUNTER — Encounter (HOSPITAL_COMMUNITY): Payer: Self-pay

## 2016-10-08 ENCOUNTER — Emergency Department (HOSPITAL_COMMUNITY)
Admission: EM | Admit: 2016-10-08 | Discharge: 2016-10-08 | Disposition: A | Discharge status: Home / Self Care | Payer: Medicaid Other | Attending: Emergency Medicine | Admitting: Emergency Medicine

## 2016-10-08 DIAGNOSIS — E039 Hypothyroidism, unspecified: Secondary | ICD-10-CM | POA: Diagnosis not present

## 2016-10-08 DIAGNOSIS — Z7982 Long term (current) use of aspirin: Secondary | ICD-10-CM | POA: Diagnosis not present

## 2016-10-08 DIAGNOSIS — I10 Essential (primary) hypertension: Secondary | ICD-10-CM | POA: Insufficient documentation

## 2016-10-08 DIAGNOSIS — K602 Anal fissure, unspecified: Secondary | ICD-10-CM | POA: Diagnosis not present

## 2016-10-08 DIAGNOSIS — Z8541 Personal history of malignant neoplasm of cervix uteri: Secondary | ICD-10-CM | POA: Diagnosis not present

## 2016-10-08 DIAGNOSIS — K625 Hemorrhage of anus and rectum: Secondary | ICD-10-CM

## 2016-10-08 DIAGNOSIS — Z79899 Other long term (current) drug therapy: Secondary | ICD-10-CM | POA: Insufficient documentation

## 2016-10-08 DIAGNOSIS — K922 Gastrointestinal hemorrhage, unspecified: Secondary | ICD-10-CM | POA: Diagnosis present

## 2016-10-08 LAB — CBC
HEMATOCRIT: 38.6 % (ref 36.0–46.0)
Hemoglobin: 13 g/dL (ref 12.0–15.0)
MCH: 28.6 pg (ref 26.0–34.0)
MCHC: 33.7 g/dL (ref 30.0–36.0)
MCV: 84.8 fL (ref 78.0–100.0)
Platelets: 182 10*3/uL (ref 150–400)
RBC: 4.55 MIL/uL (ref 3.87–5.11)
RDW: 12.7 % (ref 11.5–15.5)
WBC: 5.9 10*3/uL (ref 4.0–10.5)

## 2016-10-08 LAB — COMPREHENSIVE METABOLIC PANEL
ALBUMIN: 4.4 g/dL (ref 3.5–5.0)
ALK PHOS: 66 U/L (ref 38–126)
ALT: 36 U/L (ref 14–54)
AST: 30 U/L (ref 15–41)
Anion gap: 9 (ref 5–15)
BILIRUBIN TOTAL: 0.5 mg/dL (ref 0.3–1.2)
CALCIUM: 9.8 mg/dL (ref 8.9–10.3)
CO2: 28 mmol/L (ref 22–32)
Chloride: 103 mmol/L (ref 101–111)
Creatinine, Ser: 0.84 mg/dL (ref 0.44–1.00)
GFR calc Af Amer: >60 mL/min (ref >60)
GFR calc non Af Amer: >60 mL/min (ref >60)
GLUCOSE: 96 mg/dL (ref 65–99)
Potassium: 4 mmol/L (ref 3.5–5.1)
Sodium: 140 mmol/L (ref 135–145)
TOTAL PROTEIN: 7.8 g/dL (ref 6.5–8.1)

## 2016-10-08 LAB — TYPE AND SCREEN
ABO/RH(D): A NEG
ANTIBODY SCREEN: NEGATIVE

## 2016-10-08 LAB — POC OCCULT BLOOD, ED: Fecal Occult Bld: NEGATIVE

## 2016-10-08 LAB — ABO/RH: ABO/RH(D): A NEG

## 2016-10-08 MED ORDER — POLYETHYLENE GLYCOL 3350 17 GM/SCOOP PO POWD: 17.0000 g | Freq: Every day | ORAL | 0 refills | Status: DC

## 2016-10-08 MED ORDER — DOCUSATE SODIUM 100 MG PO CAPS: 100.0000 mg | ORAL_CAPSULE | Freq: Two times a day (BID) | ORAL | 0 refills | Status: DC

## 2016-10-08 NOTE — ED Triage Notes (Signed)
Per Pt, Pt is coming from home with complaints of bright red blood rectal bleeding that started a few days. Denied N/V/D or abdominal pain. Pt reports rectal pain. Hx of Hemorrhoid with banding completed three times.

## 2016-10-08 NOTE — Discharge Instructions (Signed)
You have no active bleeding and your red blood count is normal.  This is likely hemorrhoid and fissure bleeding from constipation. Take stool softeners which will help.  You are given contact information for gastroenterology for follow-up.   Please return for worsening symptoms, including worsening bleeding, vomiting , or any other symptoms concerning to you.

## 2016-10-08 NOTE — ED Provider Notes (Signed)
[facility] DEPT Provider Note   CSN: [id number] Arrival date & time: 10/08/16  1251   By signing my name below, I, Kristina Reed, attest that this documentation has been prepared under the direction and in the presence of [name], MD . Electronically Signed: [name], Scribe. 10/08/2016. 3:14 PM.   History   Chief Complaint Chief Complaint  Patient presents with  . GI Bleeding    HPI HPI Comments: Kristina Reed is a 51 y.o. female  with a PMHx of hemorrhoids, who presents to the Emergency Department complaining of intermittent episodes of hematochezia beginning yesterday. She notes that she passes bright red blood with every bowel movement, notices blood with wiping and in her toilet bowl, and streaked within her stool. Pt additionally reports that she had had to strain significantly harder to pass bowel movements as well. She notes having similar symptoms occurring in the past resulting in her having colonoscopy performed. She states associated symptoms of lightheadedness secondary to her symptoms. She denies vomiting, melenotic stools, abdominal pain, diarrhea, use of anticoagulants, and any other associated symptoms at this time.   The history is provided by the patient. No language interpreter was used.   Past Medical History:  Diagnosis Date  . Anxiety   . Arthritis    right knee and right elbow  . Cancer ([place])    Cervical CA with partial hysterectomy.  . Cognitive impairment, mild, so stated   . Depression   . Diverticulosis   . Elevated AST (SGOT) 01/13/2016  . Gallstones   . GERD (gastroesophageal reflux disease)   . Hx of cervical cancer 01/30/2016  . Hypertension   . Hypothyroidism   . Menopause 01/30/2016  . Sleep apnea    does not use a C-PAP  . Status post partial hysterectomy    Due to Cervical CA  . Thyroid disease   . Vaginal inclusion cyst    Patient Active Problem List   Diagnosis Date Noted  . Abnormal gynecological examination 09/22/2016  .  Abnormal thyroid blood test 09/21/2016  . Medication monitoring encounter 09/21/2016  . Hematuria 04/23/2016  . Pyuria 04/21/2016  . Hx of cervical cancer 01/30/2016  . Menopause 01/30/2016  . Knee pain, right 01/13/2016  . Status post bilateral breast biopsy 01/07/2016  . Chronic nausea 10/25/2015  . Screening for STD (sexually transmitted disease) 10/25/2015  . Mass of right breast on mammogram 05/03/2015  . External hemorrhoid 05/03/2015  . Rectal bleeding 04/30/2015  . Right knee pain 07/23/2014  . Depression, major, recurrent, moderate ([place]) 02/02/2014  . GERD without esophagitis 02/02/2014  . Adult hypothyroidism 02/02/2014  . Obstructive apnea 02/02/2014  . Incomplete bladder emptying 12/12/2012  . Tenosynovitis of foot 05/30/2012  . Benign neoplasm of kidney 05/13/2012  . Urge incontinence 05/13/2012  . FOM (frequency of micturition) 05/13/2012  . Chronic pain of right hand 05/05/2012  . Tarsal tunnel syndrome 03/03/2012    Past Surgical History:  Procedure Laterality Date  . ABDOMINAL HYSTERECTOMY     partial hysterectomy  . BREAST BIOPSY Bilateral 01/01/2016   [name] cores  . COLONOSCOPY WITH PROPOFOL N/A 06/27/2015   Procedure: COLONOSCOPY WITH PROPOFOL;  Surgeon: Josefine Class, MD;  Location: [facility] ENDOSCOPY;  Service: Endoscopy;  Laterality: N/A;  . KNEE ARTHROSCOPY Right 07/22/2016   Procedure: ARTHROSCOPY KNEE debridement microfracture;  Surgeon: Leanor Kail, MD;  Location: [facility] ORS;  Service: Orthopedics;  Laterality: Right;  . TUBAL LIGATION      OB History  No data available       Home Medications    Prior to Admission medications   Medication Sig Start Date End Date Taking? Authorizing Provider  Aspirin-Salicylamide-Caffeine (BC FAST PAIN RELIEF) 650-195-33.3 MG PACK Take 1 packet by mouth every 6 (six) hours as needed (For pain.). *Do NOT exceed 4 packets in 24 hrs.*    Historical Provider, MD  Dextromethorphan-Guaifenesin 5-100 MG/5ML LIQD  Take 10 mLs by mouth 2 (two) times daily. Prn cough. 09/10/16   [name], MD  docusate sodium (COLACE) 100 MG capsule Take 1 capsule (100 mg total) by mouth every 12 (twelve) hours. 10/08/16   [name], MD  FLUoxetine (PROZAC) 20 MG tablet Take 1 tablet (20 mg total) by mouth daily. 10/05/16   [name], MD  ipratropium (ATROVENT) 0.06 % nasal spray Place 2 sprays into both nostrils 4 (four) times daily. 09/10/16   [name], MD  levothyroxine (SYNTHROID, LEVOTHROID) 75 MCG tablet Take 1 tablet (75 mcg total) by mouth daily. 10/05/16   [name], MD  meloxicam (MOBIC) 15 MG tablet Take 15 mg by mouth daily.    Historical Provider, MD  Multiple Vitamins-Minerals (WOMENS MULTI PO) Take 1 tablet by mouth daily. Multivitamin for Women    Historical Provider, MD  ondansetron (ZOFRAN) 4 MG/2ML SOLN injection Inject 2 mLs (4 mg total) into the vein once as needed for nausea. Patient not taking: Reported on 09/02/2016 07/22/16   Leanor Kail, MD  polyethylene glycol powder (GLYCOLAX/MIRALAX) powder Take 17 g by mouth daily. 10/08/16   [name], MD    Family History Family History  Problem Relation Age of Onset  . Diabetes Mother   . Breast cancer Maternal Aunt     Social History Social History  Substance Use Topics  . Smoking status: Never Smoker  . Smokeless tobacco: Never Used  . Alcohol use No     Allergies   Percocet [oxycodone-acetaminophen]   Review of Systems Review of Systems 10/14 systems reviewed and are negative other than those stated in the HPI  Physical Exam Updated Vital Signs BP 129/87 (BP Location: Left Arm)   Pulse 68   Temp 98 F (36.7 C) (Oral)   Resp 16   Ht 5' 1.5" (1.562 m)   Wt 163 lb (73.9 kg)   LMP 05/09/1993 (Approximate)   SpO2 100%   BMI 30.30 kg/m   Physical Exam Physical Exam  Nursing note and vitals reviewed. Constitutional: Well developed, well nourished, non-toxic, and in no acute distress Head: Normocephalic and  atraumatic.  Mouth/Throat: Oropharynx is clear and moist.  Neck: Normal range of motion. Neck supple.  Cardiovascular: Normal rate and regular rhythm.   Pulmonary/Chest: Effort normal and breath sounds normal.  Abdominal: Soft. There is no tenderness. There is no rebound and no guarding.  Musculoskeletal: Normal range of motion.  Neurological: Alert, no facial droop, fluent speech, moves all extremities symmetrically Skin: Skin is warm and dry.  Psychiatric: Cooperative  ED Treatments / Results  DIAGNOSTIC STUDIES: Oxygen Saturation is 100% on RA, normal by my interpretation.    COORDINATION OF CARE: 2:58 PM Discussed treatment plan with pt at bedside and pt agreed to plan.  Labs (all labs ordered are listed, but only abnormal results are displayed) Labs Reviewed  COMPREHENSIVE METABOLIC PANEL - Abnormal; Notable for the following:       Result Value   BUN <5 (*)    All other components within normal limits  CBC  POC OCCULT BLOOD, ED  POC OCCULT BLOOD, ED  TYPE AND SCREEN  ABO/RH   EKG  EKG Interpretation None      Radiology No results found.  Procedures Procedures   Medications Ordered in ED Medications - No data to display  Initial Impression / Assessment and Plan / ED Course  I have reviewed the triage vital signs and the nursing notes.  Pertinent labs & imaging results that were available during my care of the patient were reviewed by me and considered in my medical decision making (see chart for details).     51 year old female with prior history of hemorrhoids and diverticulosis who presents with bright red blood per rectum. No blood thinners.  By history presentation sounds like potential hemorrhoidal or fissure leading. She is well-appearing and in no acute distress. Vital signs within normal limits. Blood work from triage reviewed and she has a normal hemoglobin. Small anal fissure on exam and small hemorrhoid that is nonthrombosed. No blood or stool on  rectal exam and fecal occult negative sample. Given recent history of constipation, will start on On stool softeners. History not suggestive of upper GIB or diverticular bleed at this time. Referral given to GI for ongoing management. Strict return and follow-up instructions reviewed. She expressed understanding of all discharge instructions and felt comfortable with the plan of care.   Final Clinical Impressions(s) / ED Diagnoses   Final diagnoses:  Rectal bleeding  Bright red blood per rectum  Anal fissure   New Prescriptions New Prescriptions   DOCUSATE SODIUM (COLACE) 100 MG CAPSULE    Take 1 capsule (100 mg total) by mouth every 12 (twelve) hours.   POLYETHYLENE GLYCOL POWDER (GLYCOLAX/MIRALAX) POWDER    Take 17 g by mouth daily.   I personally performed the services described in this documentation, which was scribed in my presence. The recorded information has been reviewed and is accurate.     [name], MD 10/08/16 [phone]

## 2016-10-20 ENCOUNTER — Emergency Department
Admission: EM | Admit: 2016-10-20 | Discharge: 2016-10-20 | Disposition: A | Discharge status: Home / Self Care | Payer: Medicaid Other | Attending: Emergency Medicine | Admitting: Emergency Medicine

## 2016-10-20 ENCOUNTER — Encounter: Payer: Self-pay | Admitting: Emergency Medicine

## 2016-10-20 DIAGNOSIS — E039 Hypothyroidism, unspecified: Secondary | ICD-10-CM | POA: Insufficient documentation

## 2016-10-20 DIAGNOSIS — M25561 Pain in right knee: Secondary | ICD-10-CM | POA: Diagnosis not present

## 2016-10-20 DIAGNOSIS — Z8541 Personal history of malignant neoplasm of cervix uteri: Secondary | ICD-10-CM | POA: Insufficient documentation

## 2016-10-20 DIAGNOSIS — Z79899 Other long term (current) drug therapy: Secondary | ICD-10-CM | POA: Diagnosis not present

## 2016-10-20 DIAGNOSIS — G8929 Other chronic pain: Secondary | ICD-10-CM | POA: Insufficient documentation

## 2016-10-20 DIAGNOSIS — I10 Essential (primary) hypertension: Secondary | ICD-10-CM | POA: Diagnosis not present

## 2016-10-20 MED ORDER — TRAMADOL HCL 50 MG PO TABS: 50.0000 mg | ORAL_TABLET | Freq: Four times a day (QID) | ORAL | 0 refills | Status: DC | PRN

## 2016-10-20 MED ORDER — MELOXICAM 7.5 MG PO TABS: 7.5000 mg | ORAL_TABLET | Freq: Every day | ORAL | 2 refills | Status: DC

## 2016-10-20 NOTE — ED Notes (Signed)
Went to DC pt, but not in room. Will return in a few minutes to try again in case pt was in bathroom.

## 2016-10-20 NOTE — ED Triage Notes (Signed)
Pt to ED c/o right knee pain.  States had surgery November 8th 2017 for torn cartilage and has pain since then.  Pt states difficulty walking but able to.  No obvious deformity or swelling noted to knee.

## 2016-10-20 NOTE — Discharge Instructions (Signed)
Obtain your knee brace and began wearing it again. Usual walker until you see the orthopedist. Call today and make an appointment with Dr. Jefm Bryant about your continued knee pain. Tramadol is one every 6 hours and a prescription for 2 days was given. Meloxicam 1 daily with food.

## 2016-10-20 NOTE — ED Provider Notes (Signed)
[facility] Emergency Department Provider Note  ____________________________________________   First MD Initiated Contact with Patient 10/20/16 1240     (approximate)  I have reviewed the triage vital signs and the nursing notes.   HISTORY  Chief Complaint Knee Pain   HPI Kristina Reed is a 51 y.o. female is here complaining of right knee pain. Patient states that she had surgery by Dr. Jefm Bryant in November 2017. Patient states she has continued to have pain since that time but has not followed up with Dr. Jefm Bryant  after her surgery. She states she continues to have difficulty walking but does ambulate. She was told originally to use a cane and her knee immobilizer or use her walker. Patient states she's been living in [place] and that her walker is in [place]. She states that ever occurred to her to follow-up with the orthopedic surgeon that performed her surgery. She denies any injury that has occurred since her surgery. She rates her pain as a 10 over 10.   Past Medical History:  Diagnosis Date  . Anxiety   . Arthritis    right knee and right elbow  . Cancer ([place])    Cervical CA with partial hysterectomy.  . Cognitive impairment, mild, so stated   . Depression   . Diverticulosis   . Elevated AST (SGOT) 01/13/2016  . Gallstones   . GERD (gastroesophageal reflux disease)   . Hx of cervical cancer 01/30/2016  . Hypertension   . Hypothyroidism   . Menopause 01/30/2016  . Sleep apnea    does not use a C-PAP  . Status post partial hysterectomy    Due to Cervical CA  . Thyroid disease   . Vaginal inclusion cyst     Patient Active Problem List   Diagnosis Date Noted  . Abnormal gynecological examination 09/22/2016  . Abnormal thyroid blood test 09/21/2016  . Medication monitoring encounter 09/21/2016  . Hematuria 04/23/2016  . Pyuria 04/21/2016  . Hx of cervical cancer 01/30/2016  . Menopause 01/30/2016  . Knee pain, right 01/13/2016  .  Status post bilateral breast biopsy 01/07/2016  . Chronic nausea 10/25/2015  . Screening for STD (sexually transmitted disease) 10/25/2015  . Mass of right breast on mammogram 05/03/2015  . External hemorrhoid 05/03/2015  . Rectal bleeding 04/30/2015  . Right knee pain 07/23/2014  . Depression, major, recurrent, moderate ([place]) 02/02/2014  . GERD without esophagitis 02/02/2014  . Adult hypothyroidism 02/02/2014  . Obstructive apnea 02/02/2014  . Incomplete bladder emptying 12/12/2012  . Tenosynovitis of foot 05/30/2012  . Benign neoplasm of kidney 05/13/2012  . Urge incontinence 05/13/2012  . FOM (frequency of micturition) 05/13/2012  . Chronic pain of right hand 05/05/2012  . Tarsal tunnel syndrome 03/03/2012    Past Surgical History:  Procedure Laterality Date  . ABDOMINAL HYSTERECTOMY     partial hysterectomy  . BREAST BIOPSY Bilateral 01/01/2016   [name] cores  . COLONOSCOPY WITH PROPOFOL N/A 06/27/2015   Procedure: COLONOSCOPY WITH PROPOFOL;  Surgeon: Josefine Class, MD;  Location: [facility] ENDOSCOPY;  Service: Endoscopy;  Laterality: N/A;  . KNEE ARTHROSCOPY Right 07/22/2016   Procedure: ARTHROSCOPY KNEE debridement microfracture;  Surgeon: Leanor Kail, MD;  Location: [facility] ORS;  Service: Orthopedics;  Laterality: Right;  . TUBAL LIGATION      Prior to Admission medications   Medication Sig Start Date End Date Taking? Authorizing Provider  Aspirin-Salicylamide-Caffeine (BC FAST PAIN RELIEF) 650-195-33.3 MG PACK Take 1 packet by mouth every 6 (six)  hours as needed (For pain.). *Do NOT exceed 4 packets in 24 hrs.*    Historical Provider, MD  docusate sodium (COLACE) 100 MG capsule Take 1 capsule (100 mg total) by mouth every 12 (twelve) hours. 10/08/16   [name], MD  FLUoxetine (PROZAC) 20 MG tablet Take 1 tablet (20 mg total) by mouth daily. 10/05/16   [name], MD  ipratropium (ATROVENT) 0.06 % nasal spray Place 2 sprays into both nostrils 4 (four) times daily.  09/10/16   [name], MD  levothyroxine (SYNTHROID, LEVOTHROID) 75 MCG tablet Take 1 tablet (75 mcg total) by mouth daily. 10/05/16   [name], MD  meloxicam (MOBIC) 7.5 MG tablet Take 1 tablet (7.5 mg total) by mouth daily. 10/20/16 10/20/17  Johnn Hai, PA-C  Multiple Vitamins-Minerals (WOMENS MULTI PO) Take 1 tablet by mouth daily. Multivitamin for Women    Historical Provider, MD  polyethylene glycol powder (GLYCOLAX/MIRALAX) powder Take 17 g by mouth daily. 10/08/16   [name], MD  traMADol (ULTRAM) 50 MG tablet Take 1 tablet (50 mg total) by mouth every 6 (six) hours as needed. 10/20/16   Johnn Hai, PA-C    Allergies Percocet [oxycodone-acetaminophen]  Family History  Problem Relation Age of Onset  . Diabetes Mother   . Breast cancer Maternal Aunt     Social History Social History  Substance Use Topics  . Smoking status: Never Smoker  . Smokeless tobacco: Never Used  . Alcohol use No    Review of Systems Constitutional: No fever/chills Cardiovascular: Denies chest pain. Respiratory: Denies shortness of breath. Gastrointestinal:   No nausea, no vomiting.  Musculoskeletal: Positive for right knee pain. Skin: Negative for rash. Neurological: Negative for headaches, focal weakness or numbness.  10-point ROS otherwise negative.  ____________________________________________   PHYSICAL EXAM:  VITAL SIGNS: ED Triage Vitals  Enc Vitals Group     BP 10/20/16 1138 (!) 142/88     Pulse Rate 10/20/16 1138 77     Resp 10/20/16 1138 18     Temp 10/20/16 1138 98.2 F (36.8 C)     Temp Source 10/20/16 1138 Oral     SpO2 10/20/16 1138 100 %     Weight 10/20/16 1138 160 lb (72.6 kg)     Height 10/20/16 1138 5\' 1"  (1.549 m)     Head Circumference --      Peak Flow --      Pain Score 10/20/16 1144 10     Pain Loc --      Pain Edu? --      Excl. in [place]? --     Constitutional: Alert and oriented. Well appearing and in no acute distress. Eyes:  Conjunctivae are normal. PERRL. EOMI. Head: Atraumatic. Nose: No congestion/rhinnorhea. Neck: No stridor.   Cardiovascular: Normal rate, regular rhythm. Grossly normal heart sounds.  Good peripheral circulation. Respiratory: Normal respiratory effort.  No retractions. Lungs CTAB. Musculoskeletal: Examination of the right knee there is no effusion and no erythema noted. Range of motion is without restriction however patient guards range of motion. There is no infection noted at the well-healed surgical incision sites. There is no swelling or tenderness any place other than the lateral aspect of the right knee. Neurologic:  Normal speech and language. No gross focal neurologic deficits are appreciated. Patient is ambulatory without assistance. Skin:  Skin is warm, dry and intact. No rash noted. Psychiatric: Mood and affect are normal. Speech and behavior are normal.  ____________________________________________  LABS (all labs ordered are listed, but only abnormal results are displayed)  Labs Reviewed - No data to display  PROCEDURES  Procedure(s) performed: None  Procedures  Critical Care performed: No  ____________________________________________   INITIAL IMPRESSION / ASSESSMENT AND PLAN / ED COURSE  Pertinent labs & imaging results that were available during my care of the patient were reviewed by me and considered in my medical decision making (see chart for details).  Patient is to follow-up with Dr. Jefm Bryant about her continued right knee pain. Patient is to call today and make an appointment. She is given a prescription for tramadol for 2 days. She may also take Tylenol if needed for pain.      ____________________________________________   FINAL CLINICAL IMPRESSION(S) / ED DIAGNOSES  Final diagnoses:  Chronic pain of right knee      NEW MEDICATIONS STARTED DURING THIS VISIT:  Discharge Medication List as of 10/20/2016  1:23 PM    START taking these  medications   Details  traMADol (ULTRAM) 50 MG tablet Take 1 tablet (50 mg total) by mouth every 6 (six) hours as needed., Starting Tue 10/20/2016, Print         Note:  This document was prepared using Dragon voice recognition software and may include unintentional dictation errors.    Johnn Hai, PA-C 10/20/16 1647    Lavonia Drafts, MD 10/26/16 1110

## 2016-11-09 ENCOUNTER — Telehealth: Payer: Self-pay | Admitting: Family Medicine

## 2016-11-09 NOTE — Telephone Encounter (Signed)
Updated pharmacy change in the chart.

## 2016-11-09 NOTE — Telephone Encounter (Signed)
FYI: pt is informing you that she has moved again and would like to update you with her new pharmacy. She is now using [facility]

## 2016-11-20 ENCOUNTER — Ambulatory Visit: Payer: Medicaid Other | Admitting: Family Medicine

## 2016-12-07 ENCOUNTER — Ambulatory Visit: Payer: Medicaid Other | Admitting: Family Medicine

## 2016-12-08 ENCOUNTER — Ambulatory Visit: Payer: Medicaid Other | Admitting: Family Medicine

## 2016-12-10 ENCOUNTER — Ambulatory Visit: Payer: Medicaid Other | Admitting: Family Medicine

## 2016-12-11 ENCOUNTER — Ambulatory Visit: Payer: Medicaid Other | Admitting: Family Medicine

## 2016-12-14 ENCOUNTER — Telehealth: Payer: Self-pay | Admitting: Family Medicine

## 2016-12-14 ENCOUNTER — Ambulatory Visit (INDEPENDENT_AMBULATORY_CARE_PROVIDER_SITE_OTHER): Payer: Medicaid Other | Admitting: Family Medicine

## 2016-12-14 ENCOUNTER — Encounter: Payer: Self-pay | Admitting: Family Medicine

## 2016-12-14 DIAGNOSIS — F331 Major depressive disorder, recurrent, moderate: Secondary | ICD-10-CM

## 2016-12-14 DIAGNOSIS — L989 Disorder of the skin and subcutaneous tissue, unspecified: Secondary | ICD-10-CM | POA: Insufficient documentation

## 2016-12-14 MED ORDER — LEVOTHYROXINE SODIUM 75 MCG PO TABS: 75.0000 ug | ORAL_TABLET | Freq: Every day | ORAL | 1 refills | Status: DC

## 2016-12-14 MED ORDER — FLUOXETINE HCL 20 MG PO TABS: 20.0000 mg | ORAL_TABLET | Freq: Every day | ORAL | 2 refills | Status: DC

## 2016-12-14 MED ORDER — MELOXICAM 15 MG PO TABS: 15.0000 mg | ORAL_TABLET | Freq: Every day | ORAL | 2 refills | Status: DC | PRN

## 2016-12-14 NOTE — Patient Instructions (Signed)
We'll have you see the gynecologist If you do not hear about the appointment in one week, please call us here and ask to speak to [place] [facility] in 6 weeks for follow-up and labs (non-fasting)

## 2016-12-14 NOTE — Progress Notes (Signed)
BP 112/78   Pulse 74   Temp 97.9 F (36.6 C) (Oral)   Resp 14   Wt 162 lb (73.5 kg)   LMP 05/09/1993 (Approximate)   SpO2 97%   BMI 30.61 kg/m    Subjective:    Patient ID: Kristina Reed, female    DOB: May 11, 1966, 51 y.o.   MRN: [id number]  HPI: Kristina Reed is a 51 y.o. female  Chief Complaint  Patient presents with  . check private area    states boyfriend states has some irregular skin?  . Medication Refill   Patient wants to be checked Boyfriend has checked down there and says she has irregular skin down there Patient was actually referred to gyn in January but she says she doesn't recall hearing about that appointment No vaginal bleeding No vaginal pain No intimacy with boyfriend; no sexual relationship No burning with urination No weight loss; having some night sweats, going through change she thinks She would like refills of some of her medicines prozac helps with mood; would like to continue  Depression screen [facility] 2/9 12/14/2016 09/02/2016 01/13/2016 10/25/2015 05/24/2015  Decreased Interest 0 0 0 0 0  Down, Depressed, Hopeless 0 0 1 0 0  PHQ - 2 Score 0 0 1 0 0  Altered sleeping - - - - 0  Tired, decreased energy - - - - 0  Change in appetite - - - - 0  Feeling bad or failure about yourself  - - - - 0  Trouble concentrating - - - - 0  Moving slowly or fidgety/restless - - - - 0  Suicidal thoughts - - - - 0  PHQ-9 Score - - - - 0  Difficult doing work/chores - - - - -   Relevant past medical, surgical, family and social history reviewed Past Medical History:  Diagnosis Date  . Anxiety   . Arthritis    right knee and right elbow  . Cancer ([place])    Cervical CA with partial hysterectomy.  . Cognitive impairment, mild, so stated   . Depression   . Diverticulosis   . Elevated AST (SGOT) 01/13/2016  . Gallstones   . GERD (gastroesophageal reflux disease)   . Hx of cervical cancer 01/30/2016  . Hypertension   . Hypothyroidism   . Menopause 01/30/2016  .  Sleep apnea    does not use a C-PAP  . Status post partial hysterectomy    Due to Cervical CA  . Thyroid disease   . Vaginal inclusion cyst    Past Surgical History:  Procedure Laterality Date  . ABDOMINAL HYSTERECTOMY     partial hysterectomy  . BREAST BIOPSY Bilateral 01/01/2016   [name] cores  . COLONOSCOPY WITH PROPOFOL N/A 06/27/2015   Procedure: COLONOSCOPY WITH PROPOFOL;  Surgeon: Josefine Class, MD;  Location: [facility] ENDOSCOPY;  Service: Endoscopy;  Laterality: N/A;  . KNEE ARTHROSCOPY Right 07/22/2016   Procedure: ARTHROSCOPY KNEE debridement microfracture;  Surgeon: Leanor Kail, MD;  Location: [facility] ORS;  Service: Orthopedics;  Laterality: Right;  . TUBAL LIGATION     Family History  Problem Relation Age of Onset  . Cancer Father     unknown  . Diabetes Mother   . Breast cancer Maternal Aunt   . Cancer Maternal Grandmother     cancer?  . Cancer Paternal Grandmother     cancer?  . Cancer Paternal Grandfather     unknown   Social History  Substance Use Topics  . Smoking  status: Never Smoker  . Smokeless tobacco: Never Used  . Alcohol use No   Interim medical history since last visit reviewed. Allergies and medications reviewed  Review of Systems Per HPI unless specifically indicated above     Objective:    BP 112/78   Pulse 74   Temp 97.9 F (36.6 C) (Oral)   Resp 14   Wt 162 lb (73.5 kg)   LMP 05/09/1993 (Approximate)   SpO2 97%   BMI 30.61 kg/m   Wt Readings from Last 3 Encounters:  12/15/16 164 lb 1 oz (74.4 kg)  12/14/16 162 lb (73.5 kg)  10/20/16 160 lb (72.6 kg)    Physical Exam  Constitutional: She appears well-developed and well-nourished.  Eyes: Conjunctivae are normal.  Cardiovascular: Normal rate, regular rhythm, S1 normal, S2 normal and normal heart sounds.   No extrasystoles are present.  Pulmonary/Chest: Effort normal and breath sounds normal. No respiratory distress.  Abdominal: Soft. Normal appearance and bowel sounds are  normal. She exhibits no distension. There is no hepatosplenomegaly. There is no tenderness.  Genitourinary: Uterus normal. Pelvic exam was performed with patient prone. Cervix exhibits no motion tenderness. Right adnexum displays no mass, no tenderness and no fullness. Left adnexum displays no mass, no tenderness and no fullness. Vaginal discharge found.  Genitourinary Comments: Abnormal appearance of vulva, labia bilaterally  Musculoskeletal: Normal range of motion. She exhibits no edema.  Neurological: She is alert. She displays no tremor. Gait normal.  Skin: Skin is warm and dry. No pallor.  Psychiatric: Her speech is normal and behavior is normal. Thought content normal. Her mood appears not anxious. She does not exhibit a depressed mood.    Results for orders placed or performed during the hospital encounter of 10/08/16  Comprehensive metabolic panel  Result Value Ref Range   Sodium 140 135 - 145 mmol/L   Potassium 4.0 3.5 - 5.1 mmol/L   Chloride 103 101 - 111 mmol/L   CO2 28 22 - 32 mmol/L   Glucose, Bld 96 65 - 99 mg/dL   BUN <5 (L) 6 - 20 mg/dL   Creatinine, Ser 0.84 0.44 - 1.00 mg/dL   Calcium 9.8 8.9 - 10.3 mg/dL   Total Protein 7.8 6.5 - 8.1 g/dL   Albumin 4.4 3.5 - 5.0 g/dL   AST 30 15 - 41 U/L   ALT 36 14 - 54 U/L   Alkaline Phosphatase 66 38 - 126 U/L   Total Bilirubin 0.5 0.3 - 1.2 mg/dL   GFR calc non Af Amer >60 >60 mL/min   GFR calc Af Amer >60 >60 mL/min   Anion gap 9 5 - 15  CBC  Result Value Ref Range   WBC 5.9 4.0 - 10.5 K/uL   RBC 4.55 3.87 - 5.11 MIL/uL   Hemoglobin 13.0 12.0 - 15.0 g/dL   HCT 38.6 36.0 - 46.0 %   MCV 84.8 78.0 - 100.0 fL   MCH 28.6 26.0 - 34.0 pg   MCHC 33.7 30.0 - 36.0 g/dL   RDW 12.7 11.5 - 15.5 %   Platelets 182 150 - 400 K/uL  POC occult blood, ED  Result Value Ref Range   Fecal Occult Bld NEGATIVE NEGATIVE  Type and screen [place]  Result Value Ref Range   ABO/RH(D) A NEG    Antibody Screen NEG     Sample Expiration 10/11/2016   ABO/Rh  Result Value Ref Range   ABO/RH(D) A NEG  Assessment & Plan:   Problem List Items Addressed This Visit      Genitourinary   Abnormal skin of vulva    Refer to gynecologist; patient instructed to call in one week if she hasn't heard back about the appointment        Other   Depression, major, recurrent, moderate ([place])    Refills provided for SSRI; stable      Relevant Medications   FLUoxetine (PROZAC) 20 MG tablet       Follow up plan: No Follow-up on file.  An after-visit summary was printed and given to the patient at [place].  Please see the patient instructions which may contain other information and recommendations beyond what is mentioned above in the assessment and plan.  Meds ordered this encounter  Medications  . DISCONTD: meloxicam (MOBIC) 15 MG tablet    Sig: Take 15 mg by mouth daily.  Marland Kitchen DISCONTD: levothyroxine (SYNTHROID, LEVOTHROID) 75 MCG tablet    Sig: Take 1 tablet (75 mcg total) by mouth daily.    Dispense:  30 tablet    Refill:  1    Cancel other Rx; instruct pt to take this only please; we'll call her too; thanks  . DISCONTD: FLUoxetine (PROZAC) 20 MG tablet    Sig: Take 1 tablet (20 mg total) by mouth daily.    Dispense:  30 tablet    Refill:  2  . DISCONTD: meloxicam (MOBIC) 15 MG tablet    Sig: Take 1 tablet (15 mg total) by mouth daily as needed for pain.    Dispense:  30 tablet    Refill:  2  . levothyroxine (SYNTHROID, LEVOTHROID) 75 MCG tablet    Sig: Take 1 tablet (75 mcg total) by mouth daily.    Dispense:  30 tablet    Refill:  1    Cancel other Rx; instruct pt to take this only please; we'll call her too; thanks  . FLUoxetine (PROZAC) 20 MG tablet    Sig: Take 1 tablet (20 mg total) by mouth daily.    Dispense:  30 tablet    Refill:  2  . meloxicam (MOBIC) 15 MG tablet    Sig: Take 1 tablet (15 mg total) by mouth daily as needed for pain.    Dispense:  30 tablet    Refill:  2     No orders of the defined types were placed in this encounter.

## 2016-12-14 NOTE — Assessment & Plan Note (Signed)
Refer to gynecologist; patient instructed to call in one week if she hasn't heard back about the appointment

## 2016-12-14 NOTE — Telephone Encounter (Signed)
PT IS ASKING THAT HER REFERRAL FOR OB/GYN BE PLACED AT [facility]

## 2016-12-15 ENCOUNTER — Ambulatory Visit (INDEPENDENT_AMBULATORY_CARE_PROVIDER_SITE_OTHER): Payer: Medicaid Other | Admitting: Obstetrics and Gynecology

## 2016-12-15 ENCOUNTER — Encounter: Payer: Self-pay | Admitting: Obstetrics and Gynecology

## 2016-12-15 VITALS — BP 111/74 | HR 77 | Ht 61.5 in | Wt 164.1 lb

## 2016-12-15 DIAGNOSIS — N904 Leukoplakia of vulva: Secondary | ICD-10-CM | POA: Diagnosis not present

## 2016-12-15 MED ORDER — CLOBETASOL PROPIONATE 0.05 % EX CREA: 1.0000 "application " | TOPICAL_CREAM | Freq: Every day | CUTANEOUS | 0 refills | Status: AC

## 2016-12-15 NOTE — Telephone Encounter (Signed)
[facility] NOT AKING NEW PATIENTS AT THIS TIME OR MEDICAID. SPOKE WITH PATIENT AND SHE HAS GOT AN APPT AT [facility]  FOR 12-15-16 FOR THEY HAVE STARTED TAKING NEW PATIENTS AND EXCEPTING MEDCAID.

## 2016-12-15 NOTE — Progress Notes (Signed)
HPI:      [name] is a 51 y.o. [id number] who LMP was Patient's last menstrual period was 05/09/1993 (approximate).  Subjective:   She presents today Stating that her boyfriend found some white areas on her vulva. She was referred by Dr. Sanda Klein.  She reports no itching or burning. Of significant note patient states that she previously had a hysterectomy for "cancer of the womb."    Hx: The following portions of the patient's history were reviewed and updated as appropriate:              She  has a past medical history of Anxiety; Arthritis; Cancer ([place]); Cognitive impairment, mild, so stated; Depression; Diverticulosis; Elevated AST (SGOT) (01/13/2016); Gallstones; GERD (gastroesophageal reflux disease); cervical cancer (01/30/2016); Hypertension; Hypothyroidism; Menopause (01/30/2016); Sleep apnea; Status post partial hysterectomy; Thyroid disease; and Vaginal inclusion cyst. She  does not have any pertinent problems on file. She  has a past surgical history that includes Abdominal hysterectomy; Colonoscopy with propofol (N/A, 06/27/2015); Breast biopsy (Bilateral, 01/01/2016); Tubal ligation; and Knee arthroscopy (Right, 07/22/2016). Her family history includes Breast cancer in her maternal aunt; Cancer in her father, maternal grandmother, paternal grandfather, and paternal grandmother; Diabetes in her mother. She  reports that she has never smoked. She has never used smokeless tobacco. She reports that she does not drink alcohol or use drugs. Current Outpatient Prescriptions on File Prior to Visit  Medication Sig Dispense Refill  . FLUoxetine (PROZAC) 20 MG tablet Take 1 tablet (20 mg total) by mouth daily. 30 tablet 2  . levothyroxine (SYNTHROID, LEVOTHROID) 75 MCG tablet Take 1 tablet (75 mcg total) by mouth daily. 30 tablet 1  . meloxicam (MOBIC) 15 MG tablet Take 1 tablet (15 mg total) by mouth daily as needed for pain. 30 tablet 2   No current facility-administered medications on file  prior to visit.          Review of Systems:  Review of Systems  Constitutional: Denied constitutional symptoms, night sweats, recent illness, fatigue, fever, insomnia and weight loss.  Eyes: Denied eye symptoms, eye pain, photophobia, vision change and visual disturbance.  Ears/Nose/Throat/Neck: Denied ear, nose, throat or neck symptoms, hearing loss, nasal discharge, sinus congestion and sore throat.  Cardiovascular: Denied cardiovascular symptoms, arrhythmia, chest pain/pressure, edema, exercise intolerance, orthopnea and palpitations.  Respiratory: Denied pulmonary symptoms, asthma, pleuritic pain, productive sputum, cough, dyspnea and wheezing.  Gastrointestinal: Denied, gastro-esophageal reflux, melena, nausea and vomiting.  Genitourinary: See HPI for additional information.  Musculoskeletal: Denied musculoskeletal symptoms, stiffness, swelling, muscle weakness and myalgia.  Dermatologic: Denied dermatology symptoms, rash and scar.  Neurologic: Denied neurology symptoms, dizziness, headache, neck pain and syncope.  Psychiatric: Denied psychiatric symptoms, anxiety and depression.  Endocrine: Denied endocrine symptoms including hot flashes and night sweats.   Meds:   Current Outpatient Prescriptions on File Prior to Visit  Medication Sig Dispense Refill  . FLUoxetine (PROZAC) 20 MG tablet Take 1 tablet (20 mg total) by mouth daily. 30 tablet 2  . levothyroxine (SYNTHROID, LEVOTHROID) 75 MCG tablet Take 1 tablet (75 mcg total) by mouth daily. 30 tablet 1  . meloxicam (MOBIC) 15 MG tablet Take 1 tablet (15 mg total) by mouth daily as needed for pain. 30 tablet 2   No current facility-administered medications on file prior to visit.     Objective:     Vitals:   12/15/16 1503  BP: 111/74  Pulse: 77  Physical examination   Pelvic:   Vulva: White lesions and some loss of architecture of both labia and clitoral hood as well as perineal body and perianal skin    Vagina: No lesions or abnormalities noted.  Support: Normal pelvic support.  Urethra No masses tenderness or scarring.  Meatus Normal size without lesions or prolapse.  Cervix:   Anus: Normal exam.  No lesions.  Perineum: Normal exam.  No lesions.        Bimanual   Uterus:   Adnexae: No masses.  Non-tender to palpation.  Cul-de-sac: Negative for abnormality.     Assessment:    [id number] Patient Active Problem List   Diagnosis Date Noted  . Abnormal skin of vulva 12/14/2016  . Abnormal gynecological examination 09/22/2016  . Abnormal thyroid blood test 09/21/2016  . Medication monitoring encounter 09/21/2016  . Hematuria 04/23/2016  . Pyuria 04/21/2016  . Hx of cervical cancer 01/30/2016  . Menopause 01/30/2016  . Knee pain, right 01/13/2016  . Status post bilateral breast biopsy 01/07/2016  . Chronic nausea 10/25/2015  . Screening for STD (sexually transmitted disease) 10/25/2015  . Mass of right breast on mammogram 05/03/2015  . External hemorrhoid 05/03/2015  . Rectal bleeding 04/30/2015  . Right knee pain 07/23/2014  . Depression, major, recurrent, moderate ([employer]) 02/02/2014  . GERD without esophagitis 02/02/2014  . Adult hypothyroidism 02/02/2014  . Obstructive apnea 02/02/2014  . Incomplete bladder emptying 12/12/2012  . Tenosynovitis of foot 05/30/2012  . Benign neoplasm of kidney 05/13/2012  . Urge incontinence 05/13/2012  . FOM (frequency of micturition) 05/13/2012  . Chronic pain of right hand 05/05/2012  . Tarsal tunnel syndrome 03/03/2012     1. Lichen sclerosus et atrophicus of the vulva     Very consistent with the appearance of lichen sclerosis. Strongly doubt cancer.   Plan:            1.  Clobetasol daily expect rapid improvement. If no change in appearance in 1 month consider biopsy. Orders No orders of the defined types were placed in this encounter.    Meds ordered this encounter  Medications  . clobetasol cream (TEMOVATE) 0.05 %     Sig: Apply 1 application topically daily. Use for 30 days once a day as directed.    Dispense:  60 g    Refill:  0        F/U  Return in about 4 weeks (around 01/12/2017).  Finis Bud, M.D. 12/15/2016 3:26 PM

## 2016-12-15 NOTE — Telephone Encounter (Signed)
Patient has not been seen at [facility] in 69yrs, so she would be classified as a new patient and they cannot see any new medicaid patients due to a temporary hold.  Patient has been referred to [facility] instead. She can give them a call at [phone].

## 2016-12-16 ENCOUNTER — Telehealth: Payer: Self-pay | Admitting: Obstetrics and Gynecology

## 2016-12-16 NOTE — Telephone Encounter (Signed)
Patient called with questions regarding the vaginal cream, how to apply, how long to use. She would like a call back.Thanks

## 2016-12-16 NOTE — Telephone Encounter (Signed)
All questions about how and where to apply were answered with understanding.

## 2016-12-27 NOTE — Assessment & Plan Note (Signed)
Refills provided for SSRI; stable

## 2017-01-04 ENCOUNTER — Ambulatory Visit
Admission: RE | Admit: 2017-01-04 | Discharge: 2017-01-04 | Disposition: A | Discharge status: Home / Self Care | Payer: Medicaid Other | Source: Ambulatory Visit | Attending: Family Medicine | Admitting: Family Medicine

## 2017-01-04 DIAGNOSIS — Z1231 Encounter for screening mammogram for malignant neoplasm of breast: Secondary | ICD-10-CM | POA: Insufficient documentation

## 2017-01-12 ENCOUNTER — Emergency Department: Payer: Medicaid Other

## 2017-01-12 ENCOUNTER — Encounter: Payer: Self-pay | Admitting: Emergency Medicine

## 2017-01-12 ENCOUNTER — Emergency Department
Admission: EM | Admit: 2017-01-12 | Discharge: 2017-01-12 | Disposition: A | Discharge status: Home / Self Care | Payer: Medicaid Other | Attending: Emergency Medicine | Admitting: Emergency Medicine

## 2017-01-12 ENCOUNTER — Encounter: Payer: Medicaid Other | Admitting: Obstetrics and Gynecology

## 2017-01-12 DIAGNOSIS — S0990XA Unspecified injury of head, initial encounter: Secondary | ICD-10-CM

## 2017-01-12 DIAGNOSIS — Z8541 Personal history of malignant neoplasm of cervix uteri: Secondary | ICD-10-CM | POA: Insufficient documentation

## 2017-01-12 DIAGNOSIS — Y999 Unspecified external cause status: Secondary | ICD-10-CM | POA: Diagnosis not present

## 2017-01-12 DIAGNOSIS — E039 Hypothyroidism, unspecified: Secondary | ICD-10-CM | POA: Insufficient documentation

## 2017-01-12 DIAGNOSIS — Y9389 Activity, other specified: Secondary | ICD-10-CM | POA: Insufficient documentation

## 2017-01-12 DIAGNOSIS — Y929 Unspecified place or not applicable: Secondary | ICD-10-CM | POA: Insufficient documentation

## 2017-01-12 DIAGNOSIS — Z79899 Other long term (current) drug therapy: Secondary | ICD-10-CM | POA: Insufficient documentation

## 2017-01-12 DIAGNOSIS — W2209XA Striking against other stationary object, initial encounter: Secondary | ICD-10-CM | POA: Insufficient documentation

## 2017-01-12 DIAGNOSIS — I10 Essential (primary) hypertension: Secondary | ICD-10-CM | POA: Insufficient documentation

## 2017-01-12 MED ORDER — ACETAMINOPHEN 325 MG PO TABS
ORAL_TABLET | ORAL | Status: AC
  Filled 2017-01-12: qty 2

## 2017-01-12 MED ORDER — ACETAMINOPHEN 325 MG PO TABS
650.0000 mg | ORAL_TABLET | Freq: Once | ORAL | Status: AC
  Administered 2017-01-12: 650 mg via ORAL

## 2017-01-12 NOTE — ED Triage Notes (Signed)
Patient from home via [facility]. Patient reports she was on a chair reaching into cabinet to get a cup down and hit her head on the corner of the cabinet. Patient states "I was out for a couple of minutes at least." Patient reports she was able to climb out of chair and denies falling. No bleeding or hematoma noted to patient's head. Patient A&O x4.

## 2017-01-12 NOTE — ED Provider Notes (Signed)
[facility] Emergency Department Provider Note   ____________________________________________    I have reviewed the triage vital signs and the nursing notes.   HISTORY  Chief Complaint Head Injury     HPI Kristina Reed is a 51 y.o. female who presents with complaints of a head injury. Patient reports she was standing on a chair getting a cup out of her cabinet and bumped her head on the cabinet door. She reports she felt dizzy and blacked out for 1 minute. She is not on blood thinners. She denies neuro deficits. She complains of mild headache now. No other injuries reported. This occurred approximately one-hour prior to arrival, she called EMS.   Past Medical History:  Diagnosis Date  . Anxiety   . Arthritis    right knee and right elbow  . Cancer ([place])    Cervical CA with partial hysterectomy.  . Cognitive impairment, mild, so stated   . Depression   . Diverticulosis   . Elevated AST (SGOT) 01/13/2016  . Gallstones   . GERD (gastroesophageal reflux disease)   . Hx of cervical cancer 01/30/2016  . Hypertension   . Hypothyroidism   . Menopause 01/30/2016  . Sleep apnea    does not use a C-PAP  . Status post partial hysterectomy    Due to Cervical CA  . Thyroid disease   . Vaginal inclusion cyst     Patient Active Problem List   Diagnosis Date Noted  . Abnormal skin of vulva 12/14/2016  . Abnormal gynecological examination 09/22/2016  . Abnormal thyroid blood test 09/21/2016  . Medication monitoring encounter 09/21/2016  . Hematuria 04/23/2016  . Pyuria 04/21/2016  . Hx of cervical cancer 01/30/2016  . Menopause 01/30/2016  . Knee pain, right 01/13/2016  . Status post bilateral breast biopsy 01/07/2016  . Chronic nausea 10/25/2015  . Screening for STD (sexually transmitted disease) 10/25/2015  . Mass of right breast on mammogram 05/03/2015  . External hemorrhoid 05/03/2015  . Rectal bleeding 04/30/2015  . Right knee pain 07/23/2014   . Depression, major, recurrent, moderate ([place]) 02/02/2014  . GERD without esophagitis 02/02/2014  . Adult hypothyroidism 02/02/2014  . Obstructive apnea 02/02/2014  . Incomplete bladder emptying 12/12/2012  . Tenosynovitis of foot 05/30/2012  . Benign neoplasm of kidney 05/13/2012  . Urge incontinence 05/13/2012  . FOM (frequency of micturition) 05/13/2012  . Chronic pain of right hand 05/05/2012  . Tarsal tunnel syndrome 03/03/2012    Past Surgical History:  Procedure Laterality Date  . ABDOMINAL HYSTERECTOMY     partial hysterectomy  . BREAST BIOPSY Bilateral 01/01/2016   [name] cores. Fibroadenomas  . COLONOSCOPY WITH PROPOFOL N/A 06/27/2015   Procedure: COLONOSCOPY WITH PROPOFOL;  Surgeon: Josefine Class, MD;  Location: [facility] ENDOSCOPY;  Service: Endoscopy;  Laterality: N/A;  . KNEE ARTHROSCOPY Right 07/22/2016   Procedure: ARTHROSCOPY KNEE debridement microfracture;  Surgeon: Leanor Kail, MD;  Location: [facility] ORS;  Service: Orthopedics;  Laterality: Right;  . TUBAL LIGATION      Prior to Admission medications   Medication Sig Start Date End Date Taking? Authorizing Provider  clobetasol cream (TEMOVATE) 6.76 % Apply 1 application topically daily. Use for 30 days once a day as directed. 12/15/16 01/14/17  Harlin Heys, MD  FLUoxetine (PROZAC) 20 MG tablet Take 1 tablet (20 mg total) by mouth daily. 12/14/16   [name], MD  levothyroxine (SYNTHROID, LEVOTHROID) 75 MCG tablet Take 1 tablet (75 mcg total) by mouth daily.  12/14/16   [name], MD  meloxicam (MOBIC) 15 MG tablet Take 1 tablet (15 mg total) by mouth daily as needed for pain. 12/14/16   [name], MD     Allergies Percocet [oxycodone-acetaminophen]  Family History  Problem Relation Age of Onset  . Cancer Father     unknown  . Diabetes Mother   . Breast cancer Maternal Aunt   . Cancer Maternal Grandmother     cancer?  . Cancer Paternal Grandmother     cancer?  . Cancer Paternal Grandfather      unknown    Social History Social History  Substance Use Topics  . Smoking status: Never Smoker  . Smokeless tobacco: Never Used  . Alcohol use No    Review of Systems  Constitutional: No Dizziness  ENT: No neck pain   Gastrointestinal:  No nausea, no vomiting.    Musculoskeletal: Negative for back pain. Skin: Negative for abrasion or laceration Neurological: Negative for focal deficits    ____________________________________________   PHYSICAL EXAM:  VITAL SIGNS: ED Triage Vitals  Enc Vitals Group     BP 01/12/17 0939 (!) 121/95     Pulse Rate 01/12/17 0939 66     Resp 01/12/17 0939 16     Temp 01/12/17 0936 98 F (36.7 C)     Temp Source 01/12/17 0936 Oral     SpO2 01/12/17 0939 98 %     Weight 01/12/17 0939 161 lb (73 kg)     Height 01/12/17 0939 5\' 1"  (1.549 m)     Head Circumference --      Peak Flow --      Pain Score 01/12/17 0935 10     Pain Loc --      Pain Edu? --      Excl. in [place]? --      Constitutional: Alert and oriented. No acute distress. Pleasant and interactive Eyes: Conjunctivae are normal. eomi Head: Mild tenderness to palpation approximately 1 cm posterior from the hairline, no significant hematoma, no bleeding, no abrasion Nose: No nasal injury Mouth/Throat: Mucous membranes are moist.   Cardiovascular: Normal rate, regular rhythm.  Respiratory: Normal respiratory effort.  No retractions. Genitourinary: deferred Musculoskeletal: No lower extremity tenderness or injury  Neurologic:  Normal speech and language. No gross focal neurologic deficits are appreciated.  Cn 2-12 normal Skin:  Skin is warm, dry and intact. No rash noted.   ____________________________________________   LABS (all labs ordered are listed, but only abnormal results are displayed)  Labs Reviewed - No data to display ____________________________________________  EKG   ____________________________________________  RADIOLOGY  Ct head  normal ____________________________________________   PROCEDURES  Procedure(s) performed:  No    Critical Care performed: No ____________________________________________   INITIAL IMPRESSION / ASSESSMENT AND PLAN / ED COURSE  Pertinent labs & imaging results that were available during my care of the patient were reviewed by me and considered in my medical decision making (see chart for details).  Patient overall well-appearing and in no acute distress.  Neuro Exam is normal. CT head is benign. We'll discharge her PCP follow-up is required.   ____________________________________________   FINAL CLINICAL IMPRESSION(S) / ED DIAGNOSES  Final diagnoses:  Minor head injury, initial encounter      NEW MEDICATIONS STARTED DURING THIS VISIT:  New Prescriptions   No medications on file     Note:  This document was prepared using Dragon voice recognition software and may include unintentional dictation errors.  Lavonia Drafts, MD 01/12/17 1039

## 2017-01-21 ENCOUNTER — Encounter: Payer: Self-pay | Admitting: Family Medicine

## 2017-01-21 ENCOUNTER — Ambulatory Visit (INDEPENDENT_AMBULATORY_CARE_PROVIDER_SITE_OTHER): Payer: Medicaid Other | Admitting: Family Medicine

## 2017-01-21 ENCOUNTER — Ambulatory Visit: Payer: Medicaid Other | Admitting: Family Medicine

## 2017-01-21 VITALS — BP 118/78 | HR 77 | Temp 97.7°F | Resp 14 | Wt 162.6 lb

## 2017-01-21 DIAGNOSIS — R1312 Dysphagia, oropharyngeal phase: Secondary | ICD-10-CM | POA: Diagnosis not present

## 2017-01-21 DIAGNOSIS — G44309 Post-traumatic headache, unspecified, not intractable: Secondary | ICD-10-CM

## 2017-01-21 DIAGNOSIS — Z09 Encounter for follow-up examination after completed treatment for conditions other than malignant neoplasm: Secondary | ICD-10-CM | POA: Diagnosis not present

## 2017-01-21 NOTE — Progress Notes (Addendum)
Name: Kristina Reed   MRN: [id number]    DOB: 10/26/1965   Date:01/21/2017       Progress Note  Subjective  Chief Complaint  Chief Complaint  Patient presents with  . Dysphagia    HPI  Pt presents with c/o of dysphagia that began on 01/13/17. She is able to drink liquids and eat soft foods; she feels like some foods "stick" in her throat but clears after drinking something. She has had to have her esophagus stretched in the past. Denies NVD, abdominal pain, constipation.  She was seen on 01/12/17 in the ED for hitting her head on a cabinet - she reports "blacking out" for a minute, but was still standing when she "came to".  She endorses continued headache - 9/10 pain and is intermittent, aching in quality.  She tales Tylenol, but this doesn't usually help.  No confusion, weakness, no syncope/near syncope/dizziness after discharge.  CT Head Scan findings on 01/12/17  IMPRESSION: No acute intracranial pathology. Electronically Signed By: Kathreen Devoid On: 01/12/2017 10:04  Patient Active Problem List   Diagnosis Date Noted  . Abnormal skin of vulva 12/14/2016  . Abnormal gynecological examination 09/22/2016  . Abnormal thyroid blood test 09/21/2016  . Medication monitoring encounter 09/21/2016  . Hematuria 04/23/2016  . Pyuria 04/21/2016  . Hx of cervical cancer 01/30/2016  . Menopause 01/30/2016  . Knee pain, right 01/13/2016  . Status post bilateral breast biopsy 01/07/2016  . Chronic nausea 10/25/2015  . Screening for STD (sexually transmitted disease) 10/25/2015  . Mass of right breast on mammogram 05/03/2015  . External hemorrhoid 05/03/2015  . Rectal bleeding 04/30/2015  . Right knee pain 07/23/2014  . Depression, major, recurrent, moderate ([place]) 02/02/2014  . GERD without esophagitis 02/02/2014  . Adult hypothyroidism 02/02/2014  . Obstructive apnea 02/02/2014  . Incomplete bladder emptying 12/12/2012  . Tenosynovitis of foot 05/30/2012  . Benign neoplasm of kidney  05/13/2012  . Urge incontinence 05/13/2012  . FOM (frequency of micturition) 05/13/2012  . Chronic pain of right hand 05/05/2012  . Tarsal tunnel syndrome 03/03/2012    Social History  Substance Use Topics  . Smoking status: Never Smoker  . Smokeless tobacco: Never Used  . Alcohol use No    Current Outpatient Prescriptions:  .  FLUoxetine (PROZAC) 20 MG tablet, Take 1 tablet (20 mg total) by mouth daily., Disp: 30 tablet, Rfl: 2 .  levothyroxine (SYNTHROID, LEVOTHROID) 75 MCG tablet, Take 1 tablet (75 mcg total) by mouth daily., Disp: 30 tablet, Rfl: 1 .  meloxicam (MOBIC) 15 MG tablet, Take 1 tablet (15 mg total) by mouth daily as needed for pain., Disp: 30 tablet, Rfl: 2  Allergies  Allergen Reactions  . Percocet [Oxycodone-Acetaminophen] Palpitations    ROS Constitutional: Negative for fever or weight change.  Respiratory: Negative for cough and shortness of breath.   Cardiovascular: Negative for chest pain or palpitations.  Gastrointestinal: Negative for abdominal pain, no bowel changes. See HPI Musculoskeletal: Negative for gait problem or joint swelling.  Skin: Negative for rash.  Neurological: Negative for dizziness; positive for headache.  No other specific complaints in a complete review of systems (except as listed in HPI above).  Objective  Vitals:   01/21/17 0919  BP: 118/78  Pulse: 77  Resp: 14  Temp: 97.7 F (36.5 C)  TempSrc: Oral  SpO2: 95%  Weight: 162 lb 9.6 oz (73.8 kg)    Body mass index is 30.72 kg/m.  Nursing Note and Vital Signs reviewed.  Physical Exam Constitutional: Patient appears well-developed and well-nourished. HEENT: head atraumatic, normocephalic, pupils equal and reactive to light, EOM's intact Cardiovascular: Normal rate, regular rhythm, S1/S2 present.  No murmur or rub heard. No BLE edema. Pulmonary/Chest: Effort normal and breath sounds clear. No respiratory distress or retractions. Abdominal: Soft and non-tender, bowel  sounds present x4 quadrants. Neuro: Grips equal, face symmetric, A&Ox4, no cranial nerve deficit, coordination, balance, strength (RLE has decreased strength at baseline), and speech are all normal. No pronator drift. Psychiatric: Patient has a normal mood and affect. behavior is normal. Judgment and thought content normal.  No results found for this or any previous visit (from the past 2160 hour(s)).   Assessment & Plan  1. Oropharyngeal dysphagia - Ambulatory referral to Gastroenterology  2. Hospital discharge follow-up  3. Post-concussion headache -Discussed AVS with patient regarding post-concussion care. -Recommend taking Tylenol PRN, avoid NSAIDs/Aspirin -Follow up in 2 weeks with PCP.  -Red flags and when to present for emergency care or RTC including fever >101.103F, chest pain, shortness of breath, new/worsening/un-resolving symptoms, if headaches become constant, confusion, weakness  reviewed with patient at time of visit. Follow up and care instructions discussed and provided in AVS. -Reviewed Health Maintenance: Has CPE in December schedules  I have reviewed this encounter including the documentation in this note and/or discussed this patient with the Johney Maine, FNP, NP-C. I am certifying that I agree with the content of this note as supervising physician.  Steele Sizer, MD [facility] 01/21/2017, 1:26 PM

## 2017-01-21 NOTE — Patient Instructions (Addendum)
Post-Concussion Syndrome Post-concussion syndrome is the symptoms that can occur after a head injury. These symptoms can last from weeks to months. Follow these instructions at home:  Take medicines only as told by your doctor.  Do not take aspirin.  Sleep with your head raised to help with headaches.  Avoid activities that can cause another head injury.  Do not play contact sports like football, hockey, soccer, or basketball.  Do not do other risky activities like downhill skiing, martial arts, or horseback riding until your doctor says it is okay.  Keep all follow-up visits as told by your doctor. This is important. Contact a doctor if:  You have a harder time:  Paying attention.  Focusing.  Remembering.  Learning new information.  Dealing with stress.  You need more time to complete tasks.  You are easily bothered (irritable).  You have more symptoms. Get help if you have any of these symptoms for more than two weeks after your injury:  Long-lasting (chronic) headaches.  Dizziness.  Trouble balancing.  Feeling sick to your stomach (nauseous).  Trouble with your vision.  Noise or light bothers you more.  Depression.  Mood swings.  Feeling worried (anxious).  Easily bothered.  Memory problems.  Trouble concentrating or paying attention.  Sleep problems.  Feeling tired all of the time. Get help right away if:  You feel confused.  You feel very sleepy.  You are hard to wake up.  You feel sick to your stomach.  You keep throwing up (vomiting).  You feel like you are moving when you are not (vertigo).  Your eyes move back and forth very quickly.  You start shaking (convulsing) or pass out (faint).  You have very bad headaches that do not get better with medicine.  You cannot use your arms or legs like normal.  One of the black centers of your eyes (pupils) is bigger than the other.  You have clear or bloody fluid coming from your  nose or ears.  Your problems get worse, not better. This information is not intended to replace advice given to you by your health care provider. Make sure you discuss any questions you have with your health care provider. Document Released: 10/08/2004 Document Revised: 02/06/2016 Document Reviewed: 12/06/2013 Elsevier Interactive Patient Education  2017 [employer].

## 2017-01-26 ENCOUNTER — Ambulatory Visit (INDEPENDENT_AMBULATORY_CARE_PROVIDER_SITE_OTHER): Payer: Medicaid Other | Admitting: Obstetrics and Gynecology

## 2017-01-26 ENCOUNTER — Encounter: Payer: Self-pay | Admitting: Obstetrics and Gynecology

## 2017-01-26 VITALS — BP 102/68 | HR 66 | Ht 61.0 in | Wt 163.1 lb

## 2017-01-26 DIAGNOSIS — N904 Leukoplakia of vulva: Secondary | ICD-10-CM | POA: Diagnosis not present

## 2017-01-26 NOTE — Addendum Note (Signed)
Addended by: Raliegh Ip on: 01/26/2017 10:27 AM   Modules accepted: Orders

## 2017-01-26 NOTE — Progress Notes (Signed)
HPI:      [name] is a 51 y.o. [id number] who LMP was Patient's last menstrual period was 05/09/1993 (approximate).  Subjective:   She presents today After using clobetasol daily. She reports that her boyfriend thinks the white areas on her labia are enlarging. She remains asymptomatic with no itching or burning or vulvar irritation.    Hx: The following portions of the patient's history were reviewed and updated as appropriate:             She  has a past medical history of Anxiety; Arthritis; Cancer ([place]); Cognitive impairment, mild, so stated; Depression; Diverticulosis; Elevated AST (SGOT) (01/13/2016); Gallstones; GERD (gastroesophageal reflux disease); cervical cancer (01/30/2016); Hypertension; Hypothyroidism; Menopause (01/30/2016); Sleep apnea; Status post partial hysterectomy; Thyroid disease; and Vaginal inclusion cyst. She  does not have any pertinent problems on file. She  has a past surgical history that includes Abdominal hysterectomy; Colonoscopy with propofol (N/A, 06/27/2015); Tubal ligation; Knee arthroscopy (Right, 07/22/2016); and Breast biopsy (Bilateral, 01/01/2016). Her family history includes Breast cancer in her maternal aunt; Cancer in her father, maternal grandmother, paternal grandfather, and paternal grandmother; Diabetes in her mother. She  reports that she has never smoked. She has never used smokeless tobacco. She reports that she does not drink alcohol or use drugs. She is allergic to percocet [oxycodone-acetaminophen].       Review of Systems:  Review of Systems  Constitutional: Denied constitutional symptoms, night sweats, recent illness, fatigue, fever, insomnia and weight loss.  Eyes: Denied eye symptoms, eye pain, photophobia, vision change and visual disturbance.  Ears/Nose/Throat/Neck: Denied ear, nose, throat or neck symptoms, hearing loss, nasal discharge, sinus congestion and sore throat.  Cardiovascular: Denied cardiovascular symptoms, arrhythmia,  chest pain/pressure, edema, exercise intolerance, orthopnea and palpitations.  Respiratory: Denied pulmonary symptoms, asthma, pleuritic pain, productive sputum, cough, dyspnea and wheezing.  Gastrointestinal: Denied, gastro-esophageal reflux, melena, nausea and vomiting.  Genitourinary: See HPI for additional information.  Musculoskeletal: Denied musculoskeletal symptoms, stiffness, swelling, muscle weakness and myalgia.  Dermatologic: Denied dermatology symptoms, rash and scar.  Neurologic: Denied neurology symptoms, dizziness, headache, neck pain and syncope.  Psychiatric: Denied psychiatric symptoms, anxiety and depression.  Endocrine: Denied endocrine symptoms including hot flashes and night sweats.   Meds:   Current Outpatient Prescriptions on File Prior to Visit  Medication Sig Dispense Refill  . FLUoxetine (PROZAC) 20 MG tablet Take 1 tablet (20 mg total) by mouth daily. 30 tablet 2  . levothyroxine (SYNTHROID, LEVOTHROID) 75 MCG tablet Take 1 tablet (75 mcg total) by mouth daily. 30 tablet 1  . meloxicam (MOBIC) 15 MG tablet Take 1 tablet (15 mg total) by mouth daily as needed for pain. 30 tablet 2   No current facility-administered medications on file prior to visit.     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 Appears most consistent with the diagnosis of lichen sclerosus.   Vagina: No lesions or abnormalities noted.  Support: Normal pelvic support.  Urethra No masses tenderness or scarring.  Meatus Normal size without lesions or prolapse.   2 vulvar biopsies performed one of the upper right labia majora one of the lower left posterior fourchette.  Area cleansed using Betadine  Injection of lidocaine with epinephrine  Kevorkian biopsy forceps used  to take specimens  Hemostasis easily obtained using silver  nitrate   Assessment:    [id number] Patient Active Problem List   Diagnosis Date Noted  . Abnormal skin of vulva 12/14/2016  . Abnormal gynecological examination 09/22/2016  . Abnormal thyroid blood test 09/21/2016  . Medication monitoring encounter 09/21/2016  . Hematuria 04/23/2016  . Pyuria 04/21/2016  . Hx of cervical cancer 01/30/2016  . Menopause 01/30/2016  . Knee pain, right 01/13/2016  . Status post bilateral breast biopsy 01/07/2016  . Chronic nausea 10/25/2015  . Screening for STD (sexually transmitted disease) 10/25/2015  . Mass of right breast on mammogram 05/03/2015  . External hemorrhoid 05/03/2015  . Rectal bleeding 04/30/2015  . Right knee pain 07/23/2014  . Depression, major, recurrent, moderate ([place]) 02/02/2014  . GERD without esophagitis 02/02/2014  . Adult hypothyroidism 02/02/2014  . Obstructive apnea 02/02/2014  . Incomplete bladder emptying 12/12/2012  . Tenosynovitis of foot 05/30/2012  . Benign neoplasm of kidney 05/13/2012  . Urge incontinence 05/13/2012  . FOM (frequency of micturition) 05/13/2012  . Chronic pain of right hand 05/05/2012  . Tarsal tunnel syndrome 03/03/2012     1. Lichen sclerosus et atrophicus of the vulva     This still seems the most likely diagnosis even though clobetasol has not helped.  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  Await biopsy results and discuss management at that time.   F/U  Return in about 1 week (around 02/02/2017).  Finis Bud, M.D. 01/26/2017 10:15 AM

## 2017-01-27 ENCOUNTER — Telehealth: Payer: Self-pay | Admitting: Obstetrics and Gynecology

## 2017-01-27 NOTE — Telephone Encounter (Signed)
Patients boyfriend "Laverna Peace" called. He is concerned about the redness and irritation the patient is having and wants to know if there is anything she can take to help.

## 2017-01-27 NOTE — Telephone Encounter (Signed)
Pt was concerned about the irritation and discomfort from the procedure she had done. Encouraged her to use tylenol/ibuprofen. Reassured her that area needs to heal and it can take up to a week to get results back from lab. Pt expressed understanding.

## 2017-01-28 LAB — PATHOLOGY

## 2017-02-02 ENCOUNTER — Telehealth: Payer: Self-pay | Admitting: Obstetrics and Gynecology

## 2017-02-02 ENCOUNTER — Encounter: Payer: Self-pay | Admitting: Obstetrics and Gynecology

## 2017-02-02 ENCOUNTER — Ambulatory Visit (INDEPENDENT_AMBULATORY_CARE_PROVIDER_SITE_OTHER): Payer: Medicaid Other | Admitting: Obstetrics and Gynecology

## 2017-02-02 VITALS — BP 113/76 | HR 80 | Wt 162.3 lb

## 2017-02-02 DIAGNOSIS — N904 Leukoplakia of vulva: Secondary | ICD-10-CM | POA: Diagnosis not present

## 2017-02-02 MED ORDER — TACROLIMUS 0.1 % EX OINT: TOPICAL_OINTMENT | Freq: Every day | CUTANEOUS | 0 refills | Status: DC

## 2017-02-02 NOTE — Telephone Encounter (Signed)
Patient wants to let Dr. Amalia Hailey know that the correct pharmacy is [facility] on [address]. Please Advise.

## 2017-02-02 NOTE — Telephone Encounter (Signed)
Correct pharmacy is listed.

## 2017-02-02 NOTE — Progress Notes (Signed)
HPI:      [name] is a 51 y.o. [id number] who LMP was Patient's last menstrual period was 05/09/1993 (approximate).  Subjective:   She presents today  For follow-up of her vulvar biopsies.   She continues to use clobetasol but has noticed no improvement.    Hx: The following portions of the patient's history were reviewed and updated as appropriate:              She  has a past medical history of Anxiety; Arthritis; Cancer ([place]); Cognitive impairment, mild, so stated; Depression; Diverticulosis; Elevated AST (SGOT) (01/13/2016); Gallstones; GERD (gastroesophageal reflux disease); cervical cancer (01/30/2016); Hypertension; Hypothyroidism; Menopause (01/30/2016); Sleep apnea; Status post partial hysterectomy; Thyroid disease; and Vaginal inclusion cyst. She  does not have any pertinent problems on file. She  has a past surgical history that includes Abdominal hysterectomy; Colonoscopy with propofol (N/A, 06/27/2015); Tubal ligation; Knee arthroscopy (Right, 07/22/2016); and Breast biopsy (Bilateral, 01/01/2016). Her family history includes Breast cancer in her maternal aunt; Cancer in her father, maternal grandmother, paternal grandfather, and paternal grandmother; Diabetes in her mother. She  reports that she has never smoked. She has never used smokeless tobacco. She reports that she does not drink alcohol or use drugs. Current Outpatient Prescriptions on File Prior to Visit  Medication Sig Dispense Refill  . FLUoxetine (PROZAC) 20 MG tablet Take 1 tablet (20 mg total) by mouth daily. 30 tablet 2  . levothyroxine (SYNTHROID, LEVOTHROID) 75 MCG tablet Take 1 tablet (75 mcg total) by mouth daily. 30 tablet 1  . meloxicam (MOBIC) 15 MG tablet Take 1 tablet (15 mg total) by mouth daily as needed for pain. 30 tablet 2   No current facility-administered medications on file prior to visit.          Review of Systems:  Review of Systems  Constitutional: Denied constitutional symptoms, night  sweats, recent illness, fatigue, fever, insomnia and weight loss.  Eyes: Denied eye symptoms, eye pain, photophobia, vision change and visual disturbance.  Ears/Nose/Throat/Neck: Denied ear, nose, throat or neck symptoms, hearing loss, nasal discharge, sinus congestion and sore throat.  Cardiovascular: Denied cardiovascular symptoms, arrhythmia, chest pain/pressure, edema, exercise intolerance, orthopnea and palpitations.  Respiratory: Denied pulmonary symptoms, asthma, pleuritic pain, productive sputum, cough, dyspnea and wheezing.  Gastrointestinal: Denied, gastro-esophageal reflux, melena, nausea and vomiting.  Genitourinary: See HPI for additional information.  Musculoskeletal: Denied musculoskeletal symptoms, stiffness, swelling, muscle weakness and myalgia.  Dermatologic: Denied dermatology symptoms, rash and scar.  Neurologic: Denied neurology symptoms, dizziness, headache, neck pain and syncope.  Psychiatric: Denied psychiatric symptoms, anxiety and depression.  Endocrine: Denied endocrine symptoms including hot flashes and night sweats.   Meds:   Current Outpatient Prescriptions on File Prior to Visit  Medication Sig Dispense Refill  . FLUoxetine (PROZAC) 20 MG tablet Take 1 tablet (20 mg total) by mouth daily. 30 tablet 2  . levothyroxine (SYNTHROID, LEVOTHROID) 75 MCG tablet Take 1 tablet (75 mcg total) by mouth daily. 30 tablet 1  . meloxicam (MOBIC) 15 MG tablet Take 1 tablet (15 mg total) by mouth daily as needed for pain. 30 tablet 2   No current facility-administered medications on file prior to visit.     Objective:     Vitals:   02/02/17 0839  BP: 113/76  Pulse: 80               Vulvar biopsies are consistent with lichen sclesus.  Assessment:    [id number] Patient Active Problem List  Diagnosis Date Noted  . Abnormal skin of vulva 12/14/2016  . Abnormal gynecological examination 09/22/2016  . Abnormal thyroid blood test 09/21/2016  . Medication monitoring  encounter 09/21/2016  . Hematuria 04/23/2016  . Pyuria 04/21/2016  . Hx of cervical cancer 01/30/2016  . Menopause 01/30/2016  . Knee pain, right 01/13/2016  . Status post bilateral breast biopsy 01/07/2016  . Chronic nausea 10/25/2015  . Screening for STD (sexually transmitted disease) 10/25/2015  . Mass of right breast on mammogram 05/03/2015  . External hemorrhoid 05/03/2015  . Rectal bleeding 04/30/2015  . Right knee pain 07/23/2014  . Depression, major, recurrent, moderate ([place]) 02/02/2014  . GERD without esophagitis 02/02/2014  . Adult hypothyroidism 02/02/2014  . Obstructive apnea 02/02/2014  . Incomplete bladder emptying 12/12/2012  . Tenosynovitis of foot 05/30/2012  . Benign neoplasm of kidney 05/13/2012  . Urge incontinence 05/13/2012  . FOM (frequency of micturition) 05/13/2012  . Chronic pain of right hand 05/05/2012  . Tarsal tunnel syndrome 03/03/2012     1. Lichen sclerosus et atrophicus of the vulva      not improving with use of clobetasol.   Plan:            1.   We will change medications and try to treat lichen sclerosus    Meds ordered this encounter  Medications  . tacrolimus (PROTOPIC) 0.1 % ointment    Sig: Apply topically at bedtime.    Dispense:  100 g    Refill:  0        F/U  Return in about 6 weeks (around 03/16/2017). I spent 16 minutes with this patient of which greater than 50% was spent discussing lichen sclerosis, treatment options, future treatment, chronic nature of condition, autoimmune diseases, follow-up. Finis Bud, M.D. 02/02/2017 9:14 AM

## 2017-02-04 ENCOUNTER — Encounter: Payer: Self-pay | Admitting: Family Medicine

## 2017-02-04 ENCOUNTER — Telehealth: Payer: Self-pay | Admitting: Obstetrics and Gynecology

## 2017-02-04 ENCOUNTER — Ambulatory Visit (INDEPENDENT_AMBULATORY_CARE_PROVIDER_SITE_OTHER): Payer: Medicaid Other | Admitting: Family Medicine

## 2017-02-04 VITALS — BP 124/72 | HR 85 | Temp 98.2°F | Resp 16 | Ht 61.0 in | Wt 161.9 lb

## 2017-02-04 DIAGNOSIS — R1312 Dysphagia, oropharyngeal phase: Secondary | ICD-10-CM

## 2017-02-04 DIAGNOSIS — G44309 Post-traumatic headache, unspecified, not intractable: Secondary | ICD-10-CM

## 2017-02-04 DIAGNOSIS — Z113 Encounter for screening for infections with a predominantly sexual mode of transmission: Secondary | ICD-10-CM

## 2017-02-04 LAB — HIV ANTIBODY (ROUTINE TESTING W REFLEX): HIV 1&2 Ab, 4th Generation: NONREACTIVE

## 2017-02-04 NOTE — Telephone Encounter (Signed)
Informed patient Medicaid prior Kristina Reed is needed. Paperwork faxed and as soon as the Kristina Reed comes through the script will be sent and patient will be called.

## 2017-02-04 NOTE — Patient Instructions (Addendum)
Please ask Kristina Reed at the front desk to provide Dr. Jackalyn Lombard telephone number. Your referral to him was approved on 01/22/2017. Please call his office to schedule an appointment Please take Vitamin E over the counter 1000mg  daily until you see the Neurologist.

## 2017-02-04 NOTE — Telephone Encounter (Signed)
Patient lvm wanting to speak with "Dr. Amalia Hailey or Nurse Ivin Booty" regarding patients (Presription cream)/tacrolimus (PROTOPIC) 0.1 % ointment, The prescription needs to be sent to [facility] on [address]. Please advise.

## 2017-02-04 NOTE — Progress Notes (Addendum)
Name: Kristina Reed   MRN: [id number]    DOB: 1966-02-22   Date:02/04/2017       Progress Note  Subjective  Chief Complaint  Chief Complaint  Patient presents with  . Follow-up    2 week for concussion. Patient stated she still have a headache  . Headache    HPI  Pt presents to follow up on dysphagia that began on 01/13/17. She is able to drink liquids and eat soft foods; she feels like some foods "stick" in her throat but clears after drinking something. She has had to have her esophagus stretched in the past. Denies NVD, abdominal pain, constipation.  Her referral was approved and sent to Dr. [name] on 01/22/2017 and she has not heard anything. Encouraged patient to inquire with Dr. Jackalyn [place] office, and the phone number will be provided to her at [place].  She was seen on 01/12/17 in the ED for hitting her head on a cabinet - she reports "blacking out" for a minute, but was still standing when she "came to".  She endorses waking up with ongoing daily headache - 9/10 pain and is now constant, sharp in quality.  She tales Tylenol, but this only helps "a little bit".  Reports forgetting what she's talking about sometimes and some difficulty concentrating. No confusion, weakness, no syncope/near syncope/dizziness.  CT Head Scan findings on 01/12/17  IMPRESSION: No acute intracranial pathology. Electronically Signed By: Kathreen Devoid On: 01/12/2017 10:04  STI screen: She is unsure of how many sexual partners she has had in the last year, does not use condoms, no abnormal vaginal discharge, no abdominal pain.  Patient Active Problem List   Diagnosis Date Noted  . Abnormal skin of vulva 12/14/2016  . Abnormal gynecological examination 09/22/2016  . Abnormal thyroid blood test 09/21/2016  . Medication monitoring encounter 09/21/2016  . Hematuria 04/23/2016  . Pyuria 04/21/2016  . Hx of cervical cancer 01/30/2016  . Menopause 01/30/2016  . Knee pain, right 01/13/2016  . Status post bilateral  breast biopsy 01/07/2016  . Chronic nausea 10/25/2015  . Screening for STD (sexually transmitted disease) 10/25/2015  . Mass of right breast on mammogram 05/03/2015  . External hemorrhoid 05/03/2015  . Rectal bleeding 04/30/2015  . Right knee pain 07/23/2014  . Depression, major, recurrent, moderate ([place]) 02/02/2014  . GERD without esophagitis 02/02/2014  . Adult hypothyroidism 02/02/2014  . Obstructive apnea 02/02/2014  . Incomplete bladder emptying 12/12/2012  . Tenosynovitis of foot 05/30/2012  . Benign neoplasm of kidney 05/13/2012  . Urge incontinence 05/13/2012  . FOM (frequency of micturition) 05/13/2012  . Chronic pain of right hand 05/05/2012  . Tarsal tunnel syndrome 03/03/2012    Social History  Substance Use Topics  . Smoking status: Never Smoker  . Smokeless tobacco: Never Used  . Alcohol use No     Current Outpatient Prescriptions:  .  FLUoxetine (PROZAC) 20 MG tablet, Take 1 tablet (20 mg total) by mouth daily., Disp: 30 tablet, Rfl: 2 .  levothyroxine (SYNTHROID, LEVOTHROID) 75 MCG tablet, Take 1 tablet (75 mcg total) by mouth daily., Disp: 30 tablet, Rfl: 1 .  meloxicam (MOBIC) 15 MG tablet, Take 1 tablet (15 mg total) by mouth daily as needed for pain., Disp: 30 tablet, Rfl: 2 .  tacrolimus (PROTOPIC) 0.1 % ointment, Apply topically at bedtime., Disp: 100 g, Rfl: 0  Allergies  Allergen Reactions  . Percocet [Oxycodone-Acetaminophen] Palpitations    ROS  Constitutional: Negative for fever or weight change.  Respiratory: Negative  for cough and shortness of breath.   Cardiovascular: Negative for chest pain or palpitations.  Gastrointestinal: Negative for abdominal pain, no bowel changes.  Musculoskeletal: Negative for gait problem or joint swelling.  Skin: Negative for rash.  Neurological: See HPI No other specific complaints in a complete review of systems (except as listed in HPI above).  Objective  Vitals:   02/04/17 0941  BP: 124/72  Pulse: 85   Resp: 16  Temp: 98.2 F (36.8 C)  TempSrc: Oral  SpO2: 96%  Weight: 161 lb 14.4 oz (73.4 kg)  Height: 5\' 1"  (1.549 m)    Body mass index is 30.59 kg/m.  Nursing Note and Vital Signs reviewed.  Physical Exam  Constitutional: Patient appears well-developed and well-nourished.  No distress.  HEENT: head atraumatic, normocephalic, pupils equal and reactive to light, EOM's intact, TM's without erythema or bulging, no maxillary or frontal sinus pain on palpation, neck supple without lymphadenopathy, oropharynx pink and moist without exudate Cardiovascular: Normal rate, regular rhythm, S1/S2 present.  No murmur or rub heard. No BLE edema. Pulmonary/Chest: Effort normal and breath sounds clear. No respiratory distress or retractions. Abdominal: Soft and non-tender, bowel sounds present x4 quadrants. Psychiatric: Patient has a normal mood and affect. behavior is normal. Judgment and thought content normal. Neurological: she is alert and oriented to person, place, and time. No cranial nerve deficit. Coordination, balance, strength, speech and gait are normal.   Assessment & Plan  1. Routine screening for STI (sexually transmitted infection)  - RPR - GC/Chlamydia Probe Amp - HIV antibody  2. Post-concussion headache  - Ambulatory referral to Neurology -Please take Vitamin E 1000mg  OTC Daily  3. Oropharyngeal dysphagia Please schedule appointment with Dr. [name] when possible.  -Red flags and when to present for emergency care or RTC including fever >101.38F, chest pain, shortness of breath, new/worsening/un-resolving symptoms, increase in headache pain, weakness, confusion, slurred speech, reviewed with patient at time of visit. Follow up and care instructions discussed and provided in AVS.  I have reviewed this encounter including the documentation in this note and/or discussed this patient with the Johney Maine, FNP, NP-C. I am certifying that I agree with the content of this  note as supervising physician.  Steele Sizer, MD [facility] 02/04/2017, 12:23 PM

## 2017-02-05 LAB — RPR

## 2017-02-05 NOTE — Progress Notes (Signed)
Could you please let the patient know her Syphilis and HIV are both negative.  Please remind her to come in when possible to have the Gonorrhea and chlamydia urine sample done.  Thank you!

## 2017-02-09 ENCOUNTER — Telehealth: Payer: Self-pay | Admitting: Obstetrics and Gynecology

## 2017-02-09 NOTE — Telephone Encounter (Signed)
Dr Amalia Hailey- the PA for Tacrolimus 0.1% oint that was ordered on this pt was denied. The preferred medicines are Elidel and [place]. Please advise.

## 2017-02-09 NOTE — Telephone Encounter (Signed)
Patient needs ointment sent to the [facility] on [address]. Thanks

## 2017-02-11 ENCOUNTER — Telehealth: Payer: Self-pay

## 2017-02-11 ENCOUNTER — Telehealth: Payer: Self-pay | Admitting: Family Medicine

## 2017-02-11 NOTE — Telephone Encounter (Signed)
Please call patient and remind her of need for urine specimen for gonorrhea/chlamydia testing. If she is no longer interested in the testing, I can cancel the order. Thank you!

## 2017-02-11 NOTE — Telephone Encounter (Signed)
Pt is calling again for her script. TheTacrolimus was denied by her ins. I sent a message to you with what they will cover. Please advise.

## 2017-02-12 NOTE — Telephone Encounter (Signed)
Patient notified. Will come by on June 6 and give specimen

## 2017-02-16 ENCOUNTER — Other Ambulatory Visit: Payer: Self-pay

## 2017-02-16 ENCOUNTER — Telehealth: Payer: Self-pay | Admitting: Family Medicine

## 2017-02-16 DIAGNOSIS — Z0000 Encounter for general adult medical examination without abnormal findings: Secondary | ICD-10-CM

## 2017-02-16 DIAGNOSIS — Z113 Encounter for screening for infections with a predominantly sexual mode of transmission: Secondary | ICD-10-CM

## 2017-02-16 NOTE — Telephone Encounter (Signed)
Pt presented for Urine collect for GC/Chlamydia test, previous order not pulling up in computer, new order placed today.

## 2017-02-17 ENCOUNTER — Telehealth: Payer: Self-pay

## 2017-02-17 DIAGNOSIS — R51 Headache: Secondary | ICD-10-CM

## 2017-02-17 DIAGNOSIS — G44309 Post-traumatic headache, unspecified, not intractable: Secondary | ICD-10-CM | POA: Insufficient documentation

## 2017-02-17 DIAGNOSIS — R519 Headache, unspecified: Secondary | ICD-10-CM | POA: Insufficient documentation

## 2017-02-17 LAB — GC/CHLAMYDIA PROBE AMP
CT PROBE, AMP APTIMA: NOT DETECTED
GC PROBE AMP APTIMA: NOT DETECTED

## 2017-02-17 NOTE — Telephone Encounter (Signed)
Gonorrhea and Chlamydia negative. Please notify pt. Thanks!

## 2017-02-17 NOTE — Telephone Encounter (Signed)
Spoke with Abigail Butts with Medicaid- prior auth for Tacrolimus was approved from 02/16/17-02/11/18. Auth # [id number]. [facility] notified. Message left on pts answering machine- script is ready for pickup.

## 2017-02-18 ENCOUNTER — Telehealth: Payer: Self-pay | Admitting: Family Medicine

## 2017-02-18 NOTE — Telephone Encounter (Signed)
Went over Urine results with pt

## 2017-02-18 NOTE — Telephone Encounter (Signed)
Pt would like her urine results.

## 2017-02-19 ENCOUNTER — Other Ambulatory Visit: Payer: Self-pay | Admitting: Student

## 2017-02-19 DIAGNOSIS — R09A2 Foreign body sensation, throat: Secondary | ICD-10-CM

## 2017-02-19 DIAGNOSIS — R1310 Dysphagia, unspecified: Secondary | ICD-10-CM

## 2017-02-19 DIAGNOSIS — R0989 Other specified symptoms and signs involving the circulatory and respiratory systems: Secondary | ICD-10-CM

## 2017-02-23 ENCOUNTER — Other Ambulatory Visit: Payer: Self-pay

## 2017-02-23 DIAGNOSIS — N904 Leukoplakia of vulva: Secondary | ICD-10-CM

## 2017-02-23 MED ORDER — TACROLIMUS 0.1 % EX OINT: TOPICAL_OINTMENT | Freq: Every day | CUTANEOUS | 0 refills | Status: DC

## 2017-02-25 ENCOUNTER — Ambulatory Visit
Admission: RE | Admit: 2017-02-25 | Discharge: 2017-02-25 | Disposition: A | Discharge status: Home / Self Care | Payer: Medicaid Other | Source: Ambulatory Visit | Attending: Student | Admitting: Student

## 2017-02-25 DIAGNOSIS — K224 Dyskinesia of esophagus: Secondary | ICD-10-CM | POA: Insufficient documentation

## 2017-02-25 DIAGNOSIS — R1310 Dysphagia, unspecified: Secondary | ICD-10-CM | POA: Diagnosis present

## 2017-02-25 DIAGNOSIS — F458 Other somatoform disorders: Secondary | ICD-10-CM | POA: Diagnosis present

## 2017-02-25 DIAGNOSIS — R09A2 Foreign body sensation, throat: Secondary | ICD-10-CM

## 2017-02-25 DIAGNOSIS — R0989 Other specified symptoms and signs involving the circulatory and respiratory systems: Secondary | ICD-10-CM

## 2017-02-25 DIAGNOSIS — K219 Gastro-esophageal reflux disease without esophagitis: Secondary | ICD-10-CM | POA: Insufficient documentation

## 2017-03-04 ENCOUNTER — Encounter: Payer: Self-pay | Admitting: Family Medicine

## 2017-03-04 ENCOUNTER — Ambulatory Visit (INDEPENDENT_AMBULATORY_CARE_PROVIDER_SITE_OTHER): Payer: Medicaid Other | Admitting: Family Medicine

## 2017-03-04 VITALS — BP 126/74 | HR 67 | Temp 98.0°F | Resp 14 | Wt 165.1 lb

## 2017-03-04 DIAGNOSIS — T24231A Burn of second degree of right lower leg, initial encounter: Secondary | ICD-10-CM | POA: Insufficient documentation

## 2017-03-04 DIAGNOSIS — E039 Hypothyroidism, unspecified: Secondary | ICD-10-CM | POA: Diagnosis not present

## 2017-03-04 DIAGNOSIS — J029 Acute pharyngitis, unspecified: Secondary | ICD-10-CM | POA: Diagnosis not present

## 2017-03-04 DIAGNOSIS — R1314 Dysphagia, pharyngoesophageal phase: Secondary | ICD-10-CM | POA: Diagnosis not present

## 2017-03-04 DIAGNOSIS — F331 Major depressive disorder, recurrent, moderate: Secondary | ICD-10-CM | POA: Diagnosis not present

## 2017-03-04 LAB — POCT RAPID STREP A (OFFICE): Rapid Strep A Screen: NEGATIVE

## 2017-03-04 MED ORDER — LEVOTHYROXINE SODIUM 75 MCG PO TABS: 75.0000 ug | ORAL_TABLET | Freq: Every day | ORAL | 5 refills | Status: DC

## 2017-03-04 MED ORDER — FLUOXETINE HCL 20 MG PO TABS: 20.0000 mg | ORAL_TABLET | Freq: Every day | ORAL | 5 refills | Status: DC
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
## 2017-03-04 NOTE — Assessment & Plan Note (Signed)
Warned about risk of infection; antibiotic ointment applied and covered with sterile bandage; watch for red streaks and keep covered and call if needed

## 2017-03-04 NOTE — Assessment & Plan Note (Signed)
Patient has been out of her medicine; start back on SSRI; f/u in 6 weeks, but call sooner if needed

## 2017-03-04 NOTE — Assessment & Plan Note (Signed)
Patient has been off of her medicine; new refills sent; advised that we'll want to recheck labs about 6 weeks after she starts back on her medicine; no value in checking today since TSH will likely be elevated anyway

## 2017-03-04 NOTE — Patient Instructions (Addendum)
Keep a close eye on the leg Keep it covered with antibiotic ointment and then a bandage Call us if any red streaks or if looks infected Change that bandage at least every day Start back on your medicines We'll have you see the gastroenterologist about your swallowing problem Avoid hard bread and big pieces of meat, which are more likely to get stuck Chew food thoroughly and alternate food and liquid to wash things down

## 2017-03-06 LAB — CULTURE, GROUP A STREP

## 2017-03-08 ENCOUNTER — Telehealth: Payer: Self-pay | Admitting: Obstetrics and Gynecology

## 2017-03-08 NOTE — Telephone Encounter (Signed)
Patient says the second cream she was prescribed is making her vaginal area burn and itch when she urinates., Patient would like to speak with Ivin Booty to talk about the problems she's having with her medication. Patient did not disclose any other information. Please advise.

## 2017-03-09 NOTE — Telephone Encounter (Signed)
Spoke with pt- states the new cream she has been using is causing burning. States she is cleaning the area first then applying the medication. States boyfriend thinks its getting better. Will keep appointment already scheduled for 03/16/17.

## 2017-03-16 ENCOUNTER — Encounter: Payer: Medicaid Other | Admitting: Obstetrics and Gynecology

## 2017-03-22 ENCOUNTER — Telehealth: Payer: Self-pay | Admitting: Family Medicine

## 2017-03-22 NOTE — Telephone Encounter (Signed)
Called patient back and was given phone # to [place] for referral

## 2017-03-22 NOTE — Telephone Encounter (Signed)
Pt would like a call back

## 2017-03-24 ENCOUNTER — Telehealth: Payer: Self-pay | Admitting: Family Medicine

## 2017-03-24 DIAGNOSIS — R1314 Dysphagia, pharyngoesophageal phase: Secondary | ICD-10-CM

## 2017-03-24 NOTE — Telephone Encounter (Signed)
Pt states that [facility] called her about her referral and explained to pt that she needs to see a ENT for her throat issues and not a GI doctor. Pt would like a referral to ENT.

## 2017-03-24 NOTE — Telephone Encounter (Signed)
Patient notified

## 2017-03-24 NOTE — Telephone Encounter (Signed)
Let her know I put in the new referral; thank you

## 2017-04-05 ENCOUNTER — Encounter: Payer: Self-pay | Admitting: *Deleted

## 2017-04-05 ENCOUNTER — Emergency Department
Admission: EM | Admit: 2017-04-05 | Discharge: 2017-04-05 | Disposition: A | Discharge status: Home / Self Care | Payer: Medicaid Other | Attending: Emergency Medicine | Admitting: Emergency Medicine

## 2017-04-05 DIAGNOSIS — S7011XA Contusion of right thigh, initial encounter: Secondary | ICD-10-CM | POA: Insufficient documentation

## 2017-04-05 DIAGNOSIS — W228XXA Striking against or struck by other objects, initial encounter: Secondary | ICD-10-CM | POA: Insufficient documentation

## 2017-04-05 DIAGNOSIS — S76301A Unspecified injury of muscle, fascia and tendon of the posterior muscle group at thigh level, right thigh, initial encounter: Secondary | ICD-10-CM | POA: Diagnosis not present

## 2017-04-05 DIAGNOSIS — Z8541 Personal history of malignant neoplasm of cervix uteri: Secondary | ICD-10-CM | POA: Diagnosis not present

## 2017-04-05 DIAGNOSIS — G4733 Obstructive sleep apnea (adult) (pediatric): Secondary | ICD-10-CM | POA: Diagnosis not present

## 2017-04-05 DIAGNOSIS — I10 Essential (primary) hypertension: Secondary | ICD-10-CM | POA: Insufficient documentation

## 2017-04-05 DIAGNOSIS — Z79899 Other long term (current) drug therapy: Secondary | ICD-10-CM | POA: Diagnosis not present

## 2017-04-05 DIAGNOSIS — E039 Hypothyroidism, unspecified: Secondary | ICD-10-CM | POA: Insufficient documentation

## 2017-04-05 DIAGNOSIS — Y9389 Activity, other specified: Secondary | ICD-10-CM | POA: Diagnosis not present

## 2017-04-05 DIAGNOSIS — Y999 Unspecified external cause status: Secondary | ICD-10-CM | POA: Insufficient documentation

## 2017-04-05 DIAGNOSIS — S79921A Unspecified injury of right thigh, initial encounter: Secondary | ICD-10-CM | POA: Diagnosis present

## 2017-04-05 DIAGNOSIS — Y929 Unspecified place or not applicable: Secondary | ICD-10-CM | POA: Diagnosis not present

## 2017-04-05 DIAGNOSIS — Z90711 Acquired absence of uterus with remaining cervical stump: Secondary | ICD-10-CM | POA: Insufficient documentation

## 2017-04-05 MED ORDER — CYCLOBENZAPRINE HCL 5 MG PO TABS: 5.0000 mg | ORAL_TABLET | Freq: Three times a day (TID) | ORAL | 0 refills | Status: AC | PRN

## 2017-04-05 MED ORDER — NAPROXEN 500 MG PO TABS: 500.0000 mg | ORAL_TABLET | Freq: Two times a day (BID) | ORAL | 0 refills | Status: DC

## 2017-04-05 MED ORDER — LIDOCAINE 5 % EX PTCH
1.0000 | MEDICATED_PATCH | CUTANEOUS | Status: DC
  Administered 2017-04-05: 1 via TRANSDERMAL
  Filled 2017-04-05: qty 1
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
## 2017-04-05 NOTE — ED Notes (Signed)
See triage note  states she fell while trying to get out of pick on Friday  conts to have pain to right upper leg/hip area  Bruising noted  No deformity  Ambulates to room
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
## 2017-04-13 ENCOUNTER — Encounter: Payer: Self-pay | Admitting: Emergency Medicine

## 2017-04-13 ENCOUNTER — Emergency Department
Admission: EM | Admit: 2017-04-13 | Discharge: 2017-04-13 | Disposition: A | Discharge status: Home / Self Care | Payer: Medicaid Other | Attending: Emergency Medicine | Admitting: Emergency Medicine

## 2017-04-13 DIAGNOSIS — X500XXA Overexertion from strenuous movement or load, initial encounter: Secondary | ICD-10-CM | POA: Insufficient documentation

## 2017-04-13 DIAGNOSIS — Z8541 Personal history of malignant neoplasm of cervix uteri: Secondary | ICD-10-CM | POA: Diagnosis not present

## 2017-04-13 DIAGNOSIS — G3184 Mild cognitive impairment, so stated: Secondary | ICD-10-CM | POA: Diagnosis not present

## 2017-04-13 DIAGNOSIS — Y999 Unspecified external cause status: Secondary | ICD-10-CM | POA: Insufficient documentation

## 2017-04-13 DIAGNOSIS — Z79899 Other long term (current) drug therapy: Secondary | ICD-10-CM | POA: Diagnosis not present

## 2017-04-13 DIAGNOSIS — I10 Essential (primary) hypertension: Secondary | ICD-10-CM | POA: Insufficient documentation

## 2017-04-13 DIAGNOSIS — S39012A Strain of muscle, fascia and tendon of lower back, initial encounter: Secondary | ICD-10-CM | POA: Diagnosis not present

## 2017-04-13 DIAGNOSIS — Y9389 Activity, other specified: Secondary | ICD-10-CM | POA: Diagnosis not present

## 2017-04-13 DIAGNOSIS — Y929 Unspecified place or not applicable: Secondary | ICD-10-CM | POA: Insufficient documentation

## 2017-04-13 DIAGNOSIS — E039 Hypothyroidism, unspecified: Secondary | ICD-10-CM | POA: Insufficient documentation

## 2017-04-13 DIAGNOSIS — S3992XA Unspecified injury of lower back, initial encounter: Secondary | ICD-10-CM | POA: Diagnosis present

## 2017-04-13 MED ORDER — KETOROLAC TROMETHAMINE 30 MG/ML IJ SOLN
30.0000 mg | Freq: Once | INTRAMUSCULAR | Status: AC
  Administered 2017-04-13: 30 mg via INTRAMUSCULAR
  Filled 2017-04-13: qty 1

## 2017-04-13 MED ORDER — KETOROLAC TROMETHAMINE 10 MG PO TABS: 10.0000 mg | ORAL_TABLET | Freq: Four times a day (QID) | ORAL | 0 refills | Status: DC | PRN

## 2017-04-13 MED ORDER — TIZANIDINE HCL 4 MG PO TABS: 4.0000 mg | ORAL_TABLET | Freq: Three times a day (TID) | ORAL | 0 refills | Status: DC

## 2017-04-13 MED ORDER — ORPHENADRINE CITRATE 30 MG/ML IJ SOLN
60.0000 mg | Freq: Two times a day (BID) | INTRAMUSCULAR | Status: DC
  Administered 2017-04-13: 60 mg via INTRAMUSCULAR
  Filled 2017-04-13: qty 2
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
## 2017-04-13 NOTE — ED Triage Notes (Signed)
Pt states she pulled her back helping move an old style box TV yesterday, took a BC powder today with no relief.

## 2017-04-13 NOTE — Discharge Instructions (Signed)
Follow up with your primary care provider for symptoms that are not improving over the week. Return to the ER for symptoms that change or worsen if unable to schedule an appointment.

## 2017-04-15 ENCOUNTER — Ambulatory Visit: Payer: Medicaid Other | Admitting: Family Medicine

## 2017-04-21 ENCOUNTER — Telehealth: Payer: Self-pay | Admitting: Family Medicine

## 2017-04-21 NOTE — Telephone Encounter (Signed)
Pt would like a call back. [phone]

## 2017-04-21 NOTE — Telephone Encounter (Signed)
Patient called stated her bp was high took it at [place] 139/94.  Patient was concerned so I told her she could make an appt

## 2017-04-22 ENCOUNTER — Ambulatory Visit: Payer: Medicaid Other | Admitting: Family Medicine

## 2017-04-27 ENCOUNTER — Ambulatory Visit (INDEPENDENT_AMBULATORY_CARE_PROVIDER_SITE_OTHER): Payer: Medicaid Other | Admitting: Family Medicine

## 2017-04-27 ENCOUNTER — Encounter: Payer: Self-pay | Admitting: Family Medicine

## 2017-04-27 DIAGNOSIS — K219 Gastro-esophageal reflux disease without esophagitis: Secondary | ICD-10-CM | POA: Diagnosis not present

## 2017-04-27 DIAGNOSIS — R519 Headache, unspecified: Secondary | ICD-10-CM

## 2017-04-27 DIAGNOSIS — R51 Headache: Secondary | ICD-10-CM | POA: Diagnosis not present

## 2017-04-27 DIAGNOSIS — F331 Major depressive disorder, recurrent, moderate: Secondary | ICD-10-CM

## 2017-04-27 DIAGNOSIS — L900 Lichen sclerosus et atrophicus: Secondary | ICD-10-CM

## 2017-04-27 DIAGNOSIS — E039 Hypothyroidism, unspecified: Secondary | ICD-10-CM

## 2017-04-27 NOTE — Assessment & Plan Note (Addendum)
Managed by GYN; encouraged patient to talk with specialist about cream

## 2017-04-27 NOTE — Progress Notes (Signed)
BP 130/74   Pulse 86   Temp 98.2 F (36.8 C) (Oral)   Resp 14   Wt 165 lb 9.6 oz (75.1 kg)   LMP 05/09/1993 (Approximate)   SpO2 95%   BMI 31.29 kg/m    Subjective:    Patient ID: Kristina Reed, female    DOB: Sep 08, 1966, 51 y.o.   MRN: [id number]  HPI: Kristina Reed is a 51 y.o. female  Chief Complaint  Patient presents with  . Hypertension    HPI Patient is here for f/u of follow-up; several issues; check in says "hypertension" but I cannot find any HTN meds, last several BPs have been controlled; doing well today  Hypothyroidism; ran out of her thyroid medicine last week; I pointed out that she has five refills and just needs to call her pharmacy; weight has been stable  Depression; taking SSRI; working "alright"  Allergic rhinitis; using the nasal spray; still having runny nose, worse in the mornings  Since her last visit, she has been to the ER twice; one visit was for strain of her lower back (July 31st); the other visit was for right hamstring injury (July 23rd)  She contacted her GI specialist for GI symptoms, and underwent a DG esophagus study June 14th; ENT doctor has her on 40 mg omeprazole just started 04/21/17; cannot tell a difference yet; no certain foods; he looked down and it was all red; she goes back Sept 18th and they will look again; no triggers; seeing ENT for that  She has seen Dr. Melrose Nakayama, neurologist, for headaches; those are okay; just one headache a month now, "one little one"  She also sees Dr. Amalia Hailey, GYN, for [facility]; she underwent vulvar biopsies; she was started on Protopic May 22nd; she didn't think the cream was doing any good  Depression screen [facility] 2/9 04/27/2017 03/04/2017 01/21/2017 12/14/2016 09/02/2016  Decreased Interest 0 0 0 0 0  Down, Depressed, Hopeless 0 1 0 0 0  PHQ - 2 Score 0 1 0 0 0  Altered sleeping - - - - -  Tired, decreased energy - - - - -  Change in appetite - - - - -  Feeling bad or failure about yourself  - - - - -  Trouble  concentrating - - - - -  Moving slowly or fidgety/restless - - - - -  Suicidal thoughts - - - - -  PHQ-9 Score - - - - -  Difficult doing work/chores - - - - -    Relevant past medical, surgical, family and social history reviewed Past Medical History:  Diagnosis Date  . Anxiety   . Arthritis    right knee and right elbow  . Cancer ([place])    Cervical CA with partial hysterectomy.  . Cognitive impairment, mild, so stated   . Depression   . Diverticulosis   . Gallstones   . GERD (gastroesophageal reflux disease)   . Hx of cervical cancer 01/30/2016  . Hypertension   . Hypothyroidism   . Sleep apnea    does not use a C-PAP  . Status post partial hysterectomy    Due to Cervical CA  . Vaginal inclusion cyst    Past Surgical History:  Procedure Laterality Date  . ABDOMINAL HYSTERECTOMY     partial hysterectomy  . BREAST BIOPSY Bilateral 01/01/2016   [name] cores. Fibroadenomas  . COLONOSCOPY WITH PROPOFOL N/A 06/27/2015   Procedure: COLONOSCOPY WITH PROPOFOL;  Surgeon: Josefine Class, MD;  Location: [facility] ENDOSCOPY;  Service: Endoscopy;  Laterality: N/A;  . KNEE ARTHROSCOPY Right 07/22/2016   Procedure: ARTHROSCOPY KNEE debridement microfracture;  Surgeon: Leanor Kail, MD;  Location: [facility] ORS;  Service: Orthopedics;  Laterality: Right;  . TUBAL LIGATION     Family History  Problem Relation Age of Onset  . Cancer Father        unknown  . Diabetes Mother   . Breast cancer Maternal Aunt   . Cancer Maternal Grandmother        cancer?  . Cancer Paternal Grandmother        cancer?  . Cancer Paternal Grandfather        unknown   Social History   Social History  . Marital status: Legally Separated    Spouse name: N/A  . Number of children: N/A  . Years of education: N/A   Occupational History  . Not on file.   Social History Main Topics  . Smoking status: Never Smoker  . Smokeless tobacco: Never Used  . Alcohol use No  . Drug use: No  . Sexual activity: Yes     Birth control/ protection: Surgical   Other Topics Concern  . Not on file   Social History Narrative  . No narrative on file    Interim medical history since last visit reviewed. Allergies and medications reviewed  Review of Systems Per HPI unless specifically indicated above     Objective:    BP 130/74   Pulse 86   Temp 98.2 F (36.8 C) (Oral)   Resp 14   Wt 165 lb 9.6 oz (75.1 kg)   LMP 05/09/1993 (Approximate)   SpO2 95%   BMI 31.29 kg/m   Wt Readings from Last 3 Encounters:  04/27/17 165 lb 9.6 oz (75.1 kg)  04/13/17 165 lb (74.8 kg)  04/05/17 165 lb (74.8 kg)    Physical Exam  Constitutional: She appears well-developed and well-nourished. No distress.  Obese, weight stable  HENT:  Head: Normocephalic and atraumatic.  Mouth/Throat: Mucous membranes are normal.  Eyes: EOM are normal. No scleral icterus.  Neck: No thyromegaly present.  Cardiovascular: Normal rate and regular rhythm.   Pulmonary/Chest: Effort normal and breath sounds normal. She has no wheezes.  Abdominal: She exhibits no distension.  Musculoskeletal: She exhibits edema (nonpitting edema of both lower extremities; sock elastic line visible; suggestive of mild lymphedema).  Skin: No pallor.  Psychiatric: She has a normal mood and affect. Her behavior is normal.      Assessment & Plan:   Problem List Items Addressed This Visit      Digestive   GERD without esophagitis    Continue the PPI and try to limit triggers foods; see AVS      Relevant Medications   omeprazole (PRILOSEC) 40 MG capsule     Endocrine   Adult hypothyroidism    Encouraged patient to start back on the thyroid medicine        Musculoskeletal and Integument   Lichen sclerosus et atrophicus    Managed by GYN; encouraged patient to talk with specialist about cream        Other   Headache    Evaluated by neurologist; nothing serious found, very limited and episodic headaches now      Depression, major,  recurrent, moderate ([place])    Stable; continue SSRI; call or seek help if any dark thoughts, any SI/HI          Follow up plan: No Follow-up on  file.  An after-visit summary was printed and given to the patient at [facility].  Please see the patient instructions which may contain other information and recommendations beyond what is mentioned above in the assessment and plan.  Meds ordered this encounter  Medications  . DISCONTD: meloxicam (MOBIC) 15 MG tablet    Sig: Take 15 mg by mouth daily as needed.  [place] Kitchen omeprazole (PRILOSEC) 40 MG capsule    Sig: Take 40 mg by mouth daily.  [place] Kitchen ipratropium (ATROVENT) 0.06 % nasal spray    Sig: Place 2 sprays into both nostrils 2 (two) times daily.    No orders of the defined types were placed in this encounter.

## 2017-04-27 NOTE — Assessment & Plan Note (Signed)
Encouraged patient to start back on the thyroid medicine

## 2017-04-27 NOTE — Assessment & Plan Note (Addendum)
Stable; continue SSRI; call or seek help if any dark thoughts, any SI/HI

## 2017-04-27 NOTE — Assessment & Plan Note (Signed)
Evaluated by neurologist; nothing serious found, very limited and episodic headaches now

## 2017-04-27 NOTE — Patient Instructions (Addendum)
Please do not take any more meloxicam or non-steroidal anti-inflammatories Do start back on your thyroid medicine right away, which will help your energy and keep you from gaining weight If the nasal spray isn't working well, please call your Ear Nose Throat doctor and see if he can substitute for something else  Try to limit or avoid triggers like coffee, caffeinated beverages, onions, chocolate, spicy foods, peppermint, acidic foods like pizza, spaghetti sauce, and orange juice, and green and red peppers Lose weight if you are overweight or obese Try elevating the head of your bed by placing a small wedge between your mattress and box springs to keep acid in the stomach at night instead of coming up into your esophagus  Your goal blood pressure is less than 140 mmHg on top. Try to follow the DASH guidelines (DASH stands for Dietary Approaches to Stop Hypertension) Try to limit the sodium in your diet.  Ideally, consume less than 1.5 grams (less than 1,500mg ) per day. Do not add salt when cooking or at the table.  Check the sodium amount on labels when shopping, and choose items lower in sodium when given a choice. Avoid or limit foods that already contain a lot of sodium. Eat a diet rich in fruits and vegetables and whole grains.  Please return for your next appointment on December 21st and come fasting for labs; you can have your medicines and water, but just no food or drink with calories

## 2017-04-27 NOTE — Assessment & Plan Note (Signed)
Continue the PPI and try to limit triggers foods; see AVS

## 2017-06-03 ENCOUNTER — Other Ambulatory Visit: Payer: Self-pay | Admitting: Family Medicine

## 2017-06-03 DIAGNOSIS — K219 Gastro-esophageal reflux disease without esophagitis: Secondary | ICD-10-CM

## 2017-06-03 NOTE — Progress Notes (Signed)
Note from ENT received; GI referral requested Referral entered

## 2017-06-03 NOTE — Assessment & Plan Note (Signed)
Refer to GI per ENT recommendation (see ENT office visit)

## 2017-07-05 ENCOUNTER — Encounter: Payer: Self-pay | Admitting: Gastroenterology

## 2017-07-05 ENCOUNTER — Ambulatory Visit (INDEPENDENT_AMBULATORY_CARE_PROVIDER_SITE_OTHER): Payer: Medicaid Other | Admitting: Gastroenterology

## 2017-07-05 ENCOUNTER — Other Ambulatory Visit: Payer: Self-pay

## 2017-07-05 ENCOUNTER — Encounter (INDEPENDENT_AMBULATORY_CARE_PROVIDER_SITE_OTHER): Payer: Self-pay

## 2017-07-05 ENCOUNTER — Telehealth: Payer: Self-pay

## 2017-07-05 ENCOUNTER — Other Ambulatory Visit: Payer: Self-pay | Admitting: Internal Medicine

## 2017-07-05 VITALS — BP 145/97 | HR 80 | Temp 97.8°F | Ht 61.0 in | Wt 168.8 lb

## 2017-07-05 DIAGNOSIS — R1310 Dysphagia, unspecified: Secondary | ICD-10-CM

## 2017-07-05 DIAGNOSIS — R1319 Other dysphagia: Secondary | ICD-10-CM

## 2017-07-05 DIAGNOSIS — M1710 Unilateral primary osteoarthritis, unspecified knee: Secondary | ICD-10-CM | POA: Insufficient documentation

## 2017-07-05 DIAGNOSIS — M179 Osteoarthritis of knee, unspecified: Secondary | ICD-10-CM | POA: Insufficient documentation

## 2017-07-05 DIAGNOSIS — M2240 Chondromalacia patellae, unspecified knee: Secondary | ICD-10-CM | POA: Insufficient documentation

## 2017-07-05 DIAGNOSIS — M2550 Pain in unspecified joint: Secondary | ICD-10-CM | POA: Insufficient documentation

## 2017-07-05 NOTE — Progress Notes (Signed)
Jonathon Bellows MD, MRCP(U.K) [place]  [place], [place] [address]  Main: [phone]  Fax: [phone]   Gastroenterology Consultation  Referring Provider:     [name], MD Primary Care Physician:  [name], MD Primary Gastroenterologist:  Dr. Jonathon Bellows  Reason for Consultation:     GERD        HPI:   Kristina Reed is a 51 y.o. y/o female referred for consultation & management  by Dr. Sanda Klein, Satira Anis, MD.    She has been referred for GERD.   Summary of history   She used to be a patient of [facility] and was last seen on 02/2017 . She had been seen for nausea and vomiting after eating greasy foods, rectal bleeding.At the last visit had issues with dysphagia. Was not on a PPi at that time. Plan at last visit was to obtain a Barium swallow which showed moderate GERD. Was commenced on omeprazole 20 mg once a day . Subsequently referred to ENT.   Last EGD: 02/15/14 - reflux esophagitis.Last colonoscopy was in 06/2015 and was found to have diverticulosis and internal hemorrhoids.   She says that food gets stuck in her throat . Says she was strectched in the past . Occurs everytime she eats, liquids goes down well, Points to her neck, says food goes the wrong way sometimes. Takes 40 mg of omeprazole everyday . 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 She eats steak , all other meat . Meat more likely gets stuck. Bread goes down well.    Past Medical History:  Diagnosis Date  . Anxiety   . Arthritis    right knee and right elbow  . Cancer ([place])    Cervical CA with partial hysterectomy.  . Cognitive impairment, mild, so stated   . Depression   . Diverticulosis   . Gallstones   . GERD (gastroesophageal reflux disease)   . Hx of cervical cancer 01/30/2016  . Hypertension   . Hypothyroidism   . Sleep apnea    does not use a C-PAP  . Status post partial hysterectomy    Due to Cervical CA  . Vaginal inclusion cyst     Past Surgical History:    Procedure Laterality Date  . ABDOMINAL HYSTERECTOMY     partial hysterectomy  . BREAST BIOPSY Bilateral 01/01/2016   [name] cores. Fibroadenomas  . COLONOSCOPY WITH PROPOFOL N/A 06/27/2015   Procedure: COLONOSCOPY WITH PROPOFOL;  Surgeon: Josefine Class, MD;  Location: [facility] ENDOSCOPY;  Service: Endoscopy;  Laterality: N/A;  . KNEE ARTHROSCOPY Right 07/22/2016   Procedure: ARTHROSCOPY KNEE debridement microfracture;  Surgeon: Leanor Kail, MD;  Location: [facility] ORS;  Service: Orthopedics;  Laterality: Right;  . TUBAL LIGATION      Prior to Admission medications   Medication Sig Start Date End Date Taking? Authorizing Provider  escitalopram (LEXAPRO) 10 MG tablet Take by mouth. 04/21/16  Yes [provider]  meloxicam (MOBIC) 15 MG tablet Take by mouth. 12/31/16  Yes [provider]  nortriptyline (PAMELOR) 10 MG capsule Take 1 pill at night for one week then increase to 2 pills at night 02/16/17  Yes [provider]  FLUoxetine (PROZAC) 20 MG tablet Take 1 tablet (20 mg total) by mouth daily. 03/04/17   [name], MD  ipratropium (ATROVENT) 0.06 % nasal spray Place 2 sprays into both nostrils 2 (two) times daily.    [provider]  levothyroxine (SYNTHROID,  LEVOTHROID) 75 MCG tablet Take 1 tablet (75 mcg total) by mouth daily. 03/04/17   [name], MD  omeprazole (PRILOSEC) 40 MG capsule Take 40 mg by mouth daily.    [provider]    Family History  Problem Relation Age of Onset  . Cancer Father        unknown  . Diabetes Mother   . Breast cancer Maternal Aunt   . Cancer Maternal Grandmother        cancer?  . Cancer Paternal Grandmother        cancer?  . Cancer Paternal Grandfather        unknown     Social History  Substance Use Topics  . Smoking status: Never Smoker  . Smokeless tobacco: Never Used  . Alcohol use No    Allergies as of 07/05/2017 - Review Complete 04/27/2017  Allergen Reaction Noted  . Percocet  [oxycodone-acetaminophen] Palpitations 01/20/2015    Review of Systems:    All systems reviewed and negative except where noted in HPI.   Physical Exam:  LMP 05/09/1993 (Approximate)  Patient's last menstrual period was 05/09/1993 (approximate). Psych:  Alert and cooperative. Normal mood and affect. General:   Alert,  Well-developed, well-nourished, pleasant and cooperative in NAD Head:  Normocephalic and atraumatic. Eyes:  Sclera clear, no icterus.   Conjunctiva pink. Ears:  Normal auditory acuity. Nose:  No deformity, discharge, or lesions. Mouth:  No teeth  Neck:  Supple; no masses or thyromegaly. Lungs:  Respirations even and unlabored.  Clear throughout to auscultation.   No wheezes, crackles, or rhonchi. No acute distress. Heart:  Regular rate and rhythm; no murmurs, clicks, rubs, or gallops. Abdomen:  Normal bowel sounds.  No bruits.  Soft, non-tender and non-distended without masses, hepatosplenomegaly or hernias noted.  No guarding or rebound tenderness.    Neurologic:  Alert and oriented x3;  grossly normal neurologically. Skin:  Intact without significant lesions or rashes. No jaundice. Lymph Nodes:  No significant cervical adenopathy. Psych:  Alert and cooperative. Normal mood and affect.  Imaging Studies: No results found.  Assessment and Plan:   Kristina Reed is a 51 y.o. y/o female  here today for transfer of care from [place].  1. EGD with dilation and biopsies to r/o EOE 2. She needs new teeth as she has none- may be contributing to the issue 3. Modified barium swallow  4. If above interventions do not work will proceed with manometry/ph testing   I have discussed alternative options, risks & benefits,  which include, but are not limited to, bleeding, infection, perforation,respiratory complication & drug reaction.  The patient agrees with this plan & written consent will be obtained.     Follow up in 8-12 weeks   Dr Jonathon Bellows MD,MRCP(U.K)

## 2017-07-05 NOTE — Addendum Note (Signed)
Addended by: Peggye Ley on: 07/05/2017 03:04 PM   Modules accepted: Orders

## 2017-07-05 NOTE — Addendum Note (Signed)
Addended by: Peggye Ley on: 07/05/2017 02:03 PM   Modules accepted: Orders, SmartSet

## 2017-07-05 NOTE — Telephone Encounter (Signed)
Patient has been scheduled for barium swallow study.   Date: Tuesday, 11/20 Location: [place] Time: 1230pm  No prep required.  Patient has been advised.

## 2017-07-09 ENCOUNTER — Telehealth: Payer: Self-pay

## 2017-07-09 NOTE — Telephone Encounter (Signed)
Patient called to reschedule EGD due to transportation. She has been moved to Nov 8th.  Thanks [employer]

## 2017-07-22 ENCOUNTER — Encounter: Payer: Self-pay | Admitting: Gastroenterology

## 2017-07-22 ENCOUNTER — Ambulatory Visit
Admission: RE | Admit: 2017-07-22 | Discharge: 2017-07-22 | Disposition: A | Discharge status: Home / Self Care | Payer: Medicaid Other | Source: Ambulatory Visit | Attending: Gastroenterology | Admitting: Gastroenterology

## 2017-07-22 ENCOUNTER — Ambulatory Visit: Payer: Medicaid Other | Admitting: Anesthesiology

## 2017-07-22 ENCOUNTER — Encounter
Admission: RE | Disposition: A | Discharge status: Home / Self Care | Payer: Self-pay | Source: Ambulatory Visit | Attending: Gastroenterology

## 2017-07-22 DIAGNOSIS — Z8541 Personal history of malignant neoplasm of cervix uteri: Secondary | ICD-10-CM | POA: Diagnosis not present

## 2017-07-22 DIAGNOSIS — G4730 Sleep apnea, unspecified: Secondary | ICD-10-CM | POA: Insufficient documentation

## 2017-07-22 DIAGNOSIS — Z885 Allergy status to narcotic agent status: Secondary | ICD-10-CM | POA: Diagnosis not present

## 2017-07-22 DIAGNOSIS — M19021 Primary osteoarthritis, right elbow: Secondary | ICD-10-CM | POA: Insufficient documentation

## 2017-07-22 DIAGNOSIS — G3184 Mild cognitive impairment, so stated: Secondary | ICD-10-CM | POA: Diagnosis not present

## 2017-07-22 DIAGNOSIS — K2970 Gastritis, unspecified, without bleeding: Secondary | ICD-10-CM | POA: Diagnosis not present

## 2017-07-22 DIAGNOSIS — K3189 Other diseases of stomach and duodenum: Secondary | ICD-10-CM | POA: Diagnosis not present

## 2017-07-22 DIAGNOSIS — Z79899 Other long term (current) drug therapy: Secondary | ICD-10-CM | POA: Diagnosis not present

## 2017-07-22 DIAGNOSIS — Z8719 Personal history of other diseases of the digestive system: Secondary | ICD-10-CM | POA: Diagnosis not present

## 2017-07-22 DIAGNOSIS — M1711 Unilateral primary osteoarthritis, right knee: Secondary | ICD-10-CM | POA: Diagnosis not present

## 2017-07-22 DIAGNOSIS — E039 Hypothyroidism, unspecified: Secondary | ICD-10-CM | POA: Diagnosis not present

## 2017-07-22 DIAGNOSIS — K229 Disease of esophagus, unspecified: Secondary | ICD-10-CM | POA: Diagnosis not present

## 2017-07-22 DIAGNOSIS — F419 Anxiety disorder, unspecified: Secondary | ICD-10-CM | POA: Diagnosis not present

## 2017-07-22 DIAGNOSIS — I10 Essential (primary) hypertension: Secondary | ICD-10-CM | POA: Insufficient documentation

## 2017-07-22 DIAGNOSIS — R1310 Dysphagia, unspecified: Secondary | ICD-10-CM | POA: Insufficient documentation

## 2017-07-22 DIAGNOSIS — J449 Chronic obstructive pulmonary disease, unspecified: Secondary | ICD-10-CM | POA: Insufficient documentation

## 2017-07-22 DIAGNOSIS — K210 Gastro-esophageal reflux disease with esophagitis: Secondary | ICD-10-CM | POA: Insufficient documentation

## 2017-07-22 DIAGNOSIS — F329 Major depressive disorder, single episode, unspecified: Secondary | ICD-10-CM | POA: Insufficient documentation

## 2017-07-22 HISTORY — PX: ESOPHAGOGASTRODUODENOSCOPY (EGD) WITH PROPOFOL: SHX5813

## 2017-07-22 SURGERY — ESOPHAGOGASTRODUODENOSCOPY (EGD) WITH PROPOFOL
Anesthesia: General

## 2017-07-22 MED ORDER — LIDOCAINE HCL (CARDIAC) 20 MG/ML IV SOLN
INTRAVENOUS | Status: DC | PRN
  Administered 2017-07-22: 60 mg via INTRAVENOUS

## 2017-07-22 MED ORDER — SODIUM CHLORIDE 0.9 % IV SOLN
INTRAVENOUS | Status: DC
  Administered 2017-07-22: 1000 mL via INTRAVENOUS

## 2017-07-22 MED ORDER — FENTANYL CITRATE (PF) 100 MCG/2ML IJ SOLN
INTRAMUSCULAR | Status: AC
  Filled 2017-07-22: qty 2

## 2017-07-22 MED ORDER — PROPOFOL 10 MG/ML IV BOLUS
INTRAVENOUS | Status: DC | PRN
  Administered 2017-07-22: 70 mg via INTRAVENOUS

## 2017-07-22 MED ORDER — LIDOCAINE HCL (PF) 2 % IJ SOLN
INTRAMUSCULAR | Status: AC
  Filled 2017-07-22: qty 10

## 2017-07-22 MED ORDER — FENTANYL CITRATE (PF) 100 MCG/2ML IJ SOLN
INTRAMUSCULAR | Status: DC | PRN
  Administered 2017-07-22: 25 ug via INTRAVENOUS

## 2017-07-22 MED ORDER — PROPOFOL 500 MG/50ML IV EMUL
INTRAVENOUS | Status: DC | PRN
  Administered 2017-07-22: 140 ug/kg/min via INTRAVENOUS

## 2017-07-22 MED ORDER — MIDAZOLAM HCL 2 MG/2ML IJ SOLN
INTRAMUSCULAR | Status: DC | PRN
  Administered 2017-07-22: 1 mg via INTRAVENOUS

## 2017-07-22 MED ORDER — GLYCOPYRROLATE 0.2 MG/ML IJ SOLN
INTRAMUSCULAR | Status: DC | PRN
  Administered 2017-07-22: 0.2 mg via INTRAVENOUS

## 2017-07-22 MED ORDER — MIDAZOLAM HCL 2 MG/2ML IJ SOLN
INTRAMUSCULAR | Status: AC
  Filled 2017-07-22: qty 2

## 2017-07-22 NOTE — Anesthesia Postprocedure Evaluation (Signed)
Anesthesia Post Note  Patient: Kristina Reed  Procedure(s) Performed: ESOPHAGOGASTRODUODENOSCOPY (EGD) WITH PROPOFOL (N/A )  Patient location during evaluation: Endoscopy Anesthesia Type: General Level of consciousness: awake and alert Pain management: pain level controlled Vital Signs Assessment: post-procedure vital signs reviewed and stable Respiratory status: spontaneous breathing and respiratory function stable Cardiovascular status: stable Anesthetic complications: no     Last Vitals:  Vitals:   07/22/17 1032 07/22/17 1040  BP: 137/87 110/73  Pulse: 82 72  Resp: 11 16  Temp: (!) 36.1 C   SpO2: 100% 93%    Last Pain:  Vitals:   07/22/17 0949  TempSrc: Tympanic                 Lynea Rollison K

## 2017-07-22 NOTE — H&P (Signed)
Jonathon Bellows, MD [address] [place], [place], [place], [address] [address], [place], [place], [place], [address] Phone: [phone]  Fax: [phone]  Primary Care Physician:  [name], MD   Pre-Procedure History & Physical: HPI:  Kristina Reed is a 51 y.o. female is here for an endoscopy    Past Medical History:  Diagnosis Date  . Anxiety   . Arthritis    right knee and right elbow  . Cancer ([place])    Cervical CA with partial hysterectomy.  . Cognitive impairment, mild, so stated   . Depression   . Diverticulosis   . Gallstones   . GERD (gastroesophageal reflux disease)   . Hx of cervical cancer 01/30/2016  . Hypertension   . Hypothyroidism   . Sleep apnea    does not use a C-PAP  . Status post partial hysterectomy    Due to Cervical CA  . Vaginal inclusion cyst     Past Surgical History:  Procedure Laterality Date  . ABDOMINAL HYSTERECTOMY     partial hysterectomy  . BREAST BIOPSY Bilateral 01/01/2016   [name] cores. Fibroadenomas  . TUBAL LIGATION      Prior to Admission medications   Medication Sig Start Date End Date Taking? Authorizing Provider  escitalopram (LEXAPRO) 10 MG tablet Take by mouth. 04/21/16  Yes [provider]  FLUoxetine (PROZAC) 20 MG tablet Take 1 tablet (20 mg total) by mouth daily. 03/04/17  Yes Lada, Satira Anis, MD  ipratropium (ATROVENT) 0.06 % nasal spray Place 2 sprays into both nostrils 2 (two) times daily.   Yes [provider]  levothyroxine (SYNTHROID, LEVOTHROID) 75 MCG tablet Take 1 tablet (75 mcg total) by mouth daily. 03/04/17  Yes Lada, Satira Anis, MD  meloxicam (MOBIC) 15 MG tablet Take by mouth. 12/31/16  Yes [provider]  nortriptyline (PAMELOR) 10 MG capsule Take 1 pill at night for one week then increase to 2 pills at night 02/16/17  Yes [provider]  omeprazole (PRILOSEC) 40 MG capsule Take 40 mg by mouth daily.   Yes [provider]    Allergies as of  07/05/2017 - Review Complete 07/05/2017  Allergen Reaction Noted  . Percocet [oxycodone-acetaminophen] Palpitations 01/20/2015    Family History  Problem Relation Age of Onset  . Cancer Father        unknown  . Diabetes Mother   . Breast cancer Maternal Aunt   . Cancer Maternal Grandmother        cancer?  . Cancer Paternal Grandmother        cancer?  . Cancer Paternal Grandfather        unknown    Social History   Socioeconomic History  . Marital status: Legally Separated    Spouse name: Not on file  . Number of children: Not on file  . Years of education: Not on file  . Highest education level: Not on file  Social Needs  . Financial resource strain: Not on file  . Food insecurity - worry: Not on file  . Food insecurity - inability: Not on file  . Transportation needs - medical: Not on file  . Transportation needs - non-medical: Not on file  Occupational History  . Not on file  Tobacco Use  . Smoking status: Never Smoker  . Smokeless tobacco: Never Used  Substance and Sexual Activity  . Alcohol use: No  . Drug use: No  . Sexual activity: Yes  Birth control/protection: Surgical  Other Topics Concern  . Not on file  Social History Narrative  . Not on file    Review of Systems: See HPI, otherwise negative ROS  Physical Exam: BP (!) 132/100   Pulse 64   Temp (!) 97.3 F (36.3 C) (Tympanic)   Resp 16   Ht 5' 1.5" (1.562 m)   Wt 168 lb (76.2 kg)   LMP 05/09/1993 (Approximate)   SpO2 100%   BMI 31.23 kg/m  General:   Alert,  pleasant and cooperative in NAD Head:  Normocephalic and atraumatic. Neck:  Supple; no masses or thyromegaly. Lungs:  Clear throughout to auscultation, normal respiratory effort.    Heart:  +S1, +S2, Regular rate and rhythm, No edema. Abdomen:  Soft, nontender and nondistended. Normal bowel sounds, without guarding, and without rebound.   Neurologic:  Alert and  oriented x4;  grossly normal  neurologically.  Impression/Plan: Kristina Reed is here for an endoscopy  to be performed for  evaluation of dysphagia    Risks, benefits, limitations, and alternatives regarding endoscopy/dilation have been reviewed with the patient.  Questions have been answered.  All parties agreeable.   Jonathon Bellows, MD  07/22/2017, 10:12 AM

## 2017-07-22 NOTE — Transfer of Care (Signed)
Immediate Anesthesia Transfer of Care Note  Patient: Kristina Reed  Procedure(s) Performed: ESOPHAGOGASTRODUODENOSCOPY (EGD) WITH PROPOFOL (N/A )  Patient Location: PACU  Anesthesia Type:General  Level of Consciousness: awake, alert  and oriented  Airway & Oxygen Therapy: Patient Spontanous Breathing and Patient connected to nasal cannula oxygen  Post-op Assessment: Report given to RN and Post -op Vital signs reviewed and stable  Post vital signs: Reviewed and stable  Last Vitals:  Vitals:   07/22/17 0949 07/22/17 1032  BP: (!) 132/100 137/87  Pulse: 64 82  Resp: 16 11  Temp: (!) 36.3 C (!) 36.1 C  SpO2: 100% 100%    Last Pain:  Vitals:   07/22/17 0949  TempSrc: Tympanic         Complications: No apparent anesthesia complications

## 2017-07-22 NOTE — Op Note (Signed)
[facility] Gastroenterology Patient Name: Kristina Reed Procedure Date: 07/22/2017 10:14 AM MRN: [id number] Account #: [id number] Date of Birth: [birth year] Admit Type: Outpatient Age: 51 Room: [facility] ENDO ROOM 4 Gender: Female Note Status: Finalized Procedure:            Upper GI endoscopy Indications:          Dysphagia Providers:            Jonathon Bellows MD, MD Referring MD:         [name] (Referring MD) Medicines:            Monitored Anesthesia Care Complications:        No immediate complications. Procedure:            Pre-Anesthesia Assessment:                       - Prior to the procedure, a History and Physical was                        performed, and patient medications, allergies and                        sensitivities were reviewed. The patient's tolerance of                        previous anesthesia was reviewed.                       - The risks and benefits of the procedure and the                        sedation options and risks were discussed with the                        patient. All questions were answered and informed                        consent was obtained.                       - ASA Grade Assessment: III - A patient with severe                        systemic disease.                       After obtaining informed consent, the endoscope was                        passed under direct vision. Throughout the procedure,                        the patient's blood pressure, pulse, and oxygen                        saturations were monitored continuously. The Endoscope                        was introduced through the mouth, and advanced to the  third part of duodenum. The upper GI endoscopy was                        accomplished with ease. The patient tolerated the                        procedure well. Findings:      The examined duodenum was normal.      Patchy moderate inflammation characterized by  congestion (edema),       erosions and erythema was found in the gastric antrum and in the       prepyloric region of the stomach. Biopsies were taken with a cold       forceps for histology.      One tongue of salmon-colored mucosa was present. No other visible       abnormalities were present. The maximum longitudinal extent of these       esophageal mucosal changes was 0.8 cm in length. Mucosa was biopsied       with a cold forceps for histology. One specimen bottle was sent to       pathology.      Normal mucosa was found in the entire esophagus. Biopsies were taken       with a cold forceps for histology. Impression:           - Normal examined duodenum.                       - Gastritis. Biopsied.                       - Salmon-colored mucosa suspicious for Barrett's                        esophagus. Biopsied.                       - Normal mucosa was found in the entire esophagus.                        Biopsied. Recommendation:       - Discharge patient to home (with escort).                       - Resume previous diet.                       - Continue present medications.                       - Follow an antireflux regimen.                       - Use Prilosec (omeprazole) 40 mg PO daily.                       - Return to my office in 6 weeks.                       - 1. suggest obtaining false teeth to help with chewing                        of food  2. Avoid NSAID's Procedure Code(s):    --- Professional ---                       [phone], Esophagogastroduodenoscopy, flexible, transoral;                        with biopsy, single or multiple Diagnosis Code(s):    --- Professional ---                       K22.8, Other specified diseases of esophagus                       K29.70, Gastritis, unspecified, without bleeding                       R13.10, Dysphagia, unspecified CPT copyright 2016 American Medical Association. All rights reserved. The codes  documented in this report are preliminary and upon coder review may  be revised to meet current compliance requirements. Jonathon Bellows, MD Jonathon Bellows MD, MD 07/22/2017 10:27:19 AM This report has been signed electronically. Number of Addenda: 0 Note Initiated On: 07/22/2017 10:14 AM      [facility]

## 2017-07-22 NOTE — Anesthesia Preprocedure Evaluation (Signed)
Anesthesia Evaluation  Patient identified by MRN, date of birth, ID band Patient awake    Reviewed: Allergy & Precautions, NPO status , Patient's Chart, lab work & pertinent test results  History of Anesthesia Complications Negative for: history of anesthetic complications  Airway Mallampati: III       Dental  (+) Edentulous Upper, Edentulous Lower   Pulmonary sleep apnea , COPD,  COPD inhaler,           Cardiovascular hypertension, Pt. on medications (-) Past MI and (-) CHF (-) dysrhythmias (-) Valvular Problems/Murmurs     Neuro/Psych neg Seizures Anxiety Depression    GI/Hepatic Neg liver ROS, GERD  ,  Endo/Other  neg diabetesHypothyroidism   Renal/GU negative Renal ROS     Musculoskeletal   Abdominal   Peds  Hematology   Anesthesia Other Findings   Reproductive/Obstetrics                             Anesthesia Physical Anesthesia Plan  ASA: III  Anesthesia Plan: General   Post-op Pain Management:    Induction:   PONV Risk Score and Plan: Propofol infusion  Airway Management Planned: Nasal Cannula  Additional Equipment:   Intra-op Plan:   Post-operative Plan:   Informed Consent: I have reviewed the patients History and Physical, chart, labs and discussed the procedure including the risks, benefits and alternatives for the proposed anesthesia with the patient or authorized representative who has indicated his/her understanding and acceptance.     Plan Discussed with:   Anesthesia Plan Comments:         Anesthesia Quick Evaluation

## 2017-07-22 NOTE — Anesthesia Post-op Follow-up Note (Signed)
Anesthesia QCDR form completed.        

## 2017-07-23 ENCOUNTER — Encounter: Payer: Self-pay | Admitting: Gastroenterology

## 2017-07-23 ENCOUNTER — Telehealth: Payer: Self-pay

## 2017-07-23 LAB — SURGICAL PATHOLOGY

## 2017-07-23 NOTE — Telephone Encounter (Signed)
-----   Message from Jonathon Bellows, MD sent at 07/23/2017 12:34 PM EST ----- Inform   1. She likely has eosinophilic esophagitis 2. Ensure she is on a PPI 3. See me back in the office once on PPI for 4-6 weeks to discuss treatment for the eosinophilic esophagitis.

## 2017-07-23 NOTE — Telephone Encounter (Signed)
Advised pt per Dr. Vicente Males.  1. She likely has eosinophilic esophagitis  2. Ensure she is on a PPI  3. See me back in the office once on PPI for 4-6 weeks to discuss treatment for the eosinophilic esophagitis.

## 2017-08-03 ENCOUNTER — Ambulatory Visit
Admission: RE | Admit: 2017-08-03 | Discharge: 2017-08-03 | Disposition: A | Discharge status: Home / Self Care | Payer: Medicaid Other | Source: Ambulatory Visit | Attending: Gastroenterology | Admitting: Gastroenterology

## 2017-08-03 DIAGNOSIS — R1310 Dysphagia, unspecified: Secondary | ICD-10-CM | POA: Insufficient documentation

## 2017-08-03 DIAGNOSIS — R1319 Other dysphagia: Secondary | ICD-10-CM

## 2017-08-03 NOTE — Therapy (Signed)
[place], [place], [address] Phone: [phone]   Fax:     Modified Barium Swallow  Patient Details  Name: Kristina Reed MRN: [id number] Date of Birth: [birth year] No Data Recorded  Encounter Date: 08/03/2017  End of Session - 08/03/17 1602    Visit Number  1    Number of Visits  1    Date for SLP Re-Evaluation  08/03/17    SLP Start Time  1245    SLP Stop Time   1345    SLP Time Calculation (min)  60 min    Activity Tolerance  Patient tolerated treatment well       Past Medical History:  Diagnosis Date  . Anxiety   . Arthritis    right knee and right elbow  . Cancer ([place])    Cervical CA with partial hysterectomy.  . Cognitive impairment, mild, so stated   . Depression   . Diverticulosis   . Gallstones   . GERD (gastroesophageal reflux disease)   . Hx of cervical cancer 01/30/2016  . Hypertension   . Hypothyroidism   . Sleep apnea    does not use a C-PAP  . Status post partial hysterectomy    Due to Cervical CA  . Vaginal inclusion cyst     Past Surgical History:  Procedure Laterality Date  . ABDOMINAL HYSTERECTOMY     partial hysterectomy  . BREAST BIOPSY Bilateral 01/01/2016   [name] cores. Fibroadenomas  . COLONOSCOPY WITH PROPOFOL N/A 06/27/2015   Procedure: COLONOSCOPY WITH PROPOFOL;  Surgeon: Josefine Class, MD;  Location: [facility] ENDOSCOPY;  Service: Endoscopy;  Laterality: N/A;  . ESOPHAGOGASTRODUODENOSCOPY (EGD) WITH PROPOFOL N/A 07/22/2017   Procedure: ESOPHAGOGASTRODUODENOSCOPY (EGD) WITH PROPOFOL;  Surgeon: Jonathon Bellows, MD;  Location: [facility] ENDOSCOPY;  Service: Gastroenterology;  Laterality: N/A;  . KNEE ARTHROSCOPY Right 07/22/2016   Procedure: ARTHROSCOPY KNEE debridement microfracture;  Surgeon: Leanor Kail, MD;  Location: [facility] ORS;  Service: Orthopedics;  Laterality: Right;  . TUBAL LIGATION      There were no vitals filed for this visit.       Subjective: Patient behavior: (alertness, ability to follow instructions, etc.): pt alert x3; followed commands appropriately. Verbally conversive, pleasant.  Chief complaint: dysphagia. Pt w/ c/o Esophageal dysmotility and s/s of Reflux behaviors. Pt c/o Esophageal irritation and "burning" at times when eating/drinking. Noted GI report from [facility] endoscopy stating Gastritis w/ suspicion for Barrett's Esophagus both in distal Esophagus. Pt stated certain foods bother her more but that she is aware of them and "tries" not to eat "much" of them. Pt stated she was NOT taking the PPI prescribed (omeprazols 40mg ) at this time d/t being unable to "buy it until the end of the month when I get paid" - encouraged pt to call the GI office and inform them of the need for coverage until then(samples?).   Objective:  Radiological Procedure: A videoflouroscopic evaluation of oral-preparatory, reflex initiation, and pharyngeal phases of the swallow was performed; as well as a screening of the upper esophageal phase.  I. POSTURE: upright II. VIEW: lateral III. COMPENSATORY STRATEGIES: none indicated except TIME b/t po's to allow for Esophageal clearing; alternating food/liquid IV. BOLUSES ADMINISTERED:  Thin Liquid: 6 trials  Nectar-thick Liquid: 1 trial  Honey-thick Liquid: NT  Puree: 3 trials  Mechanical Soft: 3 trials V. RESULTS OF EVALUATION: A. ORAL PREPARATORY PHASE: (The lips, tongue, and velum are observed for strength and coordination)       **  Overall Severity Rating: grossly WFL; noting that pt is edentulous - pt does require min increased time for mastication/gumming of the increased textured trials(graham cracker in applesauce). Pt mashed and cleared appropriately; A-P transfer time was wfl. Note: ENT has suggested pt obtain dentures but pt is unsure of this and ability to pay for them.   B. SWALLOW INITIATION/REFLEX: (The reflex is normal if "triggered" by the time the bolus reached the base of the  tongue)  **Overall Severity Rating: grossly WFL. Timing of the pharyngeal swallow occurred primarily at the level of the valleculae w/ inconsistent spillage of the thin liquids from the valleculae - suspect d/t the impact of continuous Reflux material in the pharyngoesophageal area which can lessen sensation in the pharynx over time. No laryngeal penetration or aspiration occurred during the swallow.  C. PHARYNGEAL PHASE: (Pharyngeal function is normal if the bolus shows rapid, smooth, and continuous transit through the pharynx and there is no pharyngeal residue after the swallow)  **Overall Severity Rating: [facility]. No pharyngeal residue remained post swallow indicating adequate pharyngeal pressure and laryngeal excursion during the swallow.  D. LARYNGEAL PENETRATION: (Material entering into the laryngeal inlet/vestibule but not aspirated): NONE E. ASPIRATION: NONE F. ESOPHAGEAL PHASE: (Screening of the upper (cervical) esophagus): no retention of bolus material or Reflux material was noted in the Cervical Esophagus. However, pt indicated LOWER Esophageal discomfort as po trials continued, especially post solid trials. Time b/t trials and alternating food/liquid boluses was provided, and recommended to pt.   ASSESSMENT: Pt appears to present w/ adequate oropharyngeal phase swallow function w/ no specific oral or pharyngeal phase dysphagia noted. With regard to pt's c/o Esophageal phase issues, it was noted that no retention of bolus material or Reflux material occurred in the Cervical Esophagus. However, pt indicated LOWER Esophageal discomfort as po trials continued, especially post solid trials. Time b/t trials and alternating food/liquid boluses was provided, and recommended to pt. Any (distal) Esophageal dysmotility could impact the pharyngeal phase of swallowing if REFLUX material moves superiorly in the Esophagus(ie, pressure, irritation). Pt endorses many s/s of Esophageal discomfort; Reflux. She has  been assessed by GI and recommended to be on a PPI but is not taking it currently as she cannot afford it. With regard to the Oropharyngeal phases of swallowing, no significant deficits were noted. During the oral phase, it was noted that pt is edentulous - she does require min increased time for mastication/gumming of the increased textured trials(graham cracker in applesauce). Pt mashed and cleared(orally) appropriately; A-P transfer time was wfl. Note: ENT has suggested pt obtain dentures but pt is unsure of this and ability to pay for them. During the pharyngeal phase, timing of the pharyngeal swallow occurred primarily at the level of the valleculae w/ inconsistent spillage of the thin liquids from the valleculae - suspect d/t the impact of continuous Reflux material in the pharyngoesophageal area which can lessen sensation in the pharynx over time. No laryngeal penetration or aspiration occurred during the swallow. No pharyngeal residue remained post swallow indicating adequate pharyngeal pressure and laryngeal excursion during the swallow.   PLAN/RECOMMENDATIONS:  A. Diet: mech soft diet w/ thin liquids (softer, cooked foods; small, cut pieces moistened well)  B. Swallowing Precautions: general aspiration precautions; REFLUX precautions - Handout was given on behaviors, strategies, food/diet options  C. Recommended consultation to continue f/u w/ GI for ongoing REFLUX management, tx, and education  D. Therapy recommendations: None  E. Results and recommendations were discussed w/ pt; Handouts given. Video viewed  and discussed w/ patient.           Esophageal dysphagia - Plan: DG Swallowing Func-Speech Pathology, DG Swallowing Func-Speech Pathology  G-Codes - Aug 08, 2017 1602    Functional Assessment Tool Used  clinical judgement    Functional Limitations  Swallowing    Swallow Current Status (L4650)  At least 1 percent but less than 20 percent impaired, limited or restricted    Swallow  Goal Status (P5465)  At least 1 percent but less than 20 percent impaired, limited or restricted    Swallow Discharge Status [phone])  At least 1 percent but less than 20 percent impaired, limited or restricted           Problem List Patient Active Problem List   Diagnosis Date Noted  . 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 Joint pain 07/05/2017  . Osteoarthritis of knee 07/05/2017  . Lichen sclerosus et atrophicus 04/27/2017  . Headache 04/27/2017  . Pharyngoesophageal dysphagia 03/24/2017  . Headache disorder 02/17/2017  . Post-concussion headache 02/17/2017  . Medication monitoring encounter 09/21/2016  . Hematuria 04/23/2016  . Hx of cervical cancer 01/30/2016  . Menopause 01/30/2016  . Knee pain, right 01/13/2016  . Status post bilateral breast biopsy 01/07/2016  . Chronic nausea 10/25/2015  . Screening for STD (sexually transmitted disease) 10/25/2015  . External hemorrhoid 05/03/2015  . Rectal bleeding 04/30/2015  . Right knee pain 07/23/2014  . Depression, major, recurrent, moderate ([place]) 02/02/2014  . GERD without esophagitis 02/02/2014  . Adult hypothyroidism 02/02/2014  . Obstructive apnea 02/02/2014  . Incomplete bladder emptying 12/12/2012  . Tenosynovitis of foot 05/30/2012  . Benign neoplasm of kidney 05/13/2012  . Urge incontinence 05/13/2012  . FOM (frequency of micturition) 05/13/2012  . Chronic pain of right hand 05/05/2012  . Tarsal tunnel syndrome 03/03/2012     Orinda Kenner, MS, CCC-SLP Watson,Katherine 08-08-17, 4:03 PM  [facility] [place], [place], [address] Phone: [phone]   Fax:     Name: Kristina Reed MRN: [id number] Date of Birth: [birth year]

## 2017-08-18 ENCOUNTER — Emergency Department: Payer: Medicaid Other

## 2017-08-18 ENCOUNTER — Other Ambulatory Visit: Payer: Self-pay

## 2017-08-18 ENCOUNTER — Emergency Department
Admission: EM | Admit: 2017-08-18 | Discharge: 2017-08-18 | Disposition: A | Discharge status: Home / Self Care | Payer: Medicaid Other | Attending: Emergency Medicine | Admitting: Emergency Medicine

## 2017-08-18 DIAGNOSIS — Z8541 Personal history of malignant neoplasm of cervix uteri: Secondary | ICD-10-CM | POA: Diagnosis not present

## 2017-08-18 DIAGNOSIS — M1990 Unspecified osteoarthritis, unspecified site: Secondary | ICD-10-CM | POA: Diagnosis not present

## 2017-08-18 DIAGNOSIS — Z79899 Other long term (current) drug therapy: Secondary | ICD-10-CM | POA: Diagnosis not present

## 2017-08-18 DIAGNOSIS — M25562 Pain in left knee: Secondary | ICD-10-CM

## 2017-08-18 DIAGNOSIS — I10 Essential (primary) hypertension: Secondary | ICD-10-CM | POA: Diagnosis not present

## 2017-08-18 DIAGNOSIS — E039 Hypothyroidism, unspecified: Secondary | ICD-10-CM | POA: Diagnosis not present

## 2017-08-18 MED ORDER — MELOXICAM 7.5 MG PO TABS
15.0000 mg | ORAL_TABLET | Freq: Once | ORAL | Status: AC
  Administered 2017-08-18: 15 mg via ORAL
  Filled 2017-08-18: qty 2

## 2017-08-18 MED ORDER — MELOXICAM 7.5 MG PO TABS: 7.5000 mg | ORAL_TABLET | Freq: Every day | ORAL | 0 refills | Status: DC

## 2017-08-18 NOTE — ED Provider Notes (Signed)
[facility] Emergency Department Provider Note  ____________________________________________   First MD Initiated Contact with Patient 08/18/17 1240     (approximate)  I have reviewed the triage vital signs and the nursing notes.   HISTORY  Chief Complaint Knee Pain   HPI Kristina Reed is a 51 y.o. female  is here with complaint of left knee pain. Patient denies any injury to her knee. She states that she has had surgery on both her knees for "torn ligaments". patient has had increased difficulty walking because for left knee pain. She denies any fever or chills. She has not been taking any over-the-counter medication. Patient rates her pain as 10 over 10.   Past Medical History:  Diagnosis Date  . Anxiety   . Arthritis    right knee and right elbow  . Cancer ([place])    Cervical CA with partial hysterectomy.  . Cognitive impairment, mild, so stated   . Depression   . Diverticulosis   . Gallstones   . GERD (gastroesophageal reflux disease)   . Hx of cervical cancer 01/30/2016  . Hypertension   . Hypothyroidism   . Sleep apnea    does not use a C-PAP  . Status post partial hysterectomy    Due to Cervical CA  . Vaginal inclusion cyst     Patient Active Problem List   Diagnosis Date Noted  . 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 Joint pain 07/05/2017  . Osteoarthritis of knee 07/05/2017  . Lichen sclerosus et atrophicus 04/27/2017  . Headache 04/27/2017  . Pharyngoesophageal dysphagia 03/24/2017  . Headache disorder 02/17/2017  . Post-concussion headache 02/17/2017  . Medication monitoring encounter 09/21/2016  . Hematuria 04/23/2016  . Hx of cervical cancer 01/30/2016  . Menopause 01/30/2016  . Knee pain, right 01/13/2016  . Status post bilateral breast biopsy 01/07/2016  . Chronic nausea 10/25/2015  . Screening for STD (sexually transmitted disease) 10/25/2015  . External hemorrhoid 05/03/2015  . Rectal bleeding 04/30/2015  . Right  knee pain 07/23/2014  . Depression, major, recurrent, moderate ([place]) 02/02/2014  . GERD without esophagitis 02/02/2014  . Adult hypothyroidism 02/02/2014  . Obstructive apnea 02/02/2014  . Incomplete bladder emptying 12/12/2012  . Tenosynovitis of foot 05/30/2012  . Benign neoplasm of kidney 05/13/2012  . Urge incontinence 05/13/2012  . FOM (frequency of micturition) 05/13/2012  . Chronic pain of right hand 05/05/2012  . Tarsal tunnel syndrome 03/03/2012    Past Surgical History:  Procedure Laterality Date  . ABDOMINAL HYSTERECTOMY     partial hysterectomy  . BREAST BIOPSY Bilateral 01/01/2016   [name] cores. Fibroadenomas  . COLONOSCOPY WITH PROPOFOL N/A 06/27/2015   Procedure: COLONOSCOPY WITH PROPOFOL;  Surgeon: Josefine Class, MD;  Location: [facility] ENDOSCOPY;  Service: Endoscopy;  Laterality: N/A;  . ESOPHAGOGASTRODUODENOSCOPY (EGD) WITH PROPOFOL N/A 07/22/2017   Procedure: ESOPHAGOGASTRODUODENOSCOPY (EGD) WITH PROPOFOL;  Surgeon: Jonathon Bellows, MD;  Location: [facility] ENDOSCOPY;  Service: Gastroenterology;  Laterality: N/A;  . KNEE ARTHROSCOPY Right 07/22/2016   Procedure: ARTHROSCOPY KNEE debridement microfracture;  Surgeon: Leanor Kail, MD;  Location: [facility] ORS;  Service: Orthopedics;  Laterality: Right;  . TUBAL LIGATION      Prior to Admission medications   Medication Sig Start Date End Date Taking? Authorizing Provider  escitalopram (LEXAPRO) 10 MG tablet Take by mouth. 04/21/16   [provider]  FLUoxetine (PROZAC) 20 MG tablet Take 1 tablet (20 mg total) by mouth daily. 03/04/17   [name], MD  ipratropium (ATROVENT) 0.06 %  nasal spray Place 2 sprays into both nostrils 2 (two) times daily.    [provider]  levothyroxine (SYNTHROID, LEVOTHROID) 75 MCG tablet Take 1 tablet (75 mcg total) by mouth daily. 03/04/17   [name], MD  meloxicam (MOBIC) 7.5 MG tablet Take 1 tablet (7.5 mg total) by mouth daily. 08/18/17 08/18/18  Johnn Hai, PA-C    nortriptyline (PAMELOR) 10 MG capsule Take 1 pill at night for one week then increase to 2 pills at night 02/16/17   [provider]  omeprazole (PRILOSEC) 40 MG capsule Take 40 mg by mouth daily.    [provider]    Allergies Percocet [oxycodone-acetaminophen]  Family History  Problem Relation Age of Onset  . Cancer Father        unknown  . Diabetes Mother   . Breast cancer Maternal Aunt   . Cancer Maternal Grandmother        cancer?  . Cancer Paternal Grandmother        cancer?  . Cancer Paternal Grandfather        unknown    Social History Social History   Tobacco Use  . Smoking status: Never Smoker  . Smokeless tobacco: Never Used  Substance Use Topics  . Alcohol use: No  . Drug use: No    Review of Systems Constitutional: No fever/chills Cardiovascular: Denies chest pain. Respiratory: Denies shortness of breath. Musculoskeletal: positive left knee pain. Skin: Negative for rash. Neurological: Negative for  focal weakness or numbness. ____________________________________________   PHYSICAL EXAM:  VITAL SIGNS: ED Triage Vitals [08/18/17 1156]  Enc Vitals Group     BP 118/89     Pulse Rate 88     Resp 20     Temp 98 F (36.7 C)     Temp Source Oral     SpO2 100 %     Weight 168 lb (76.2 kg)     Height 5\' 1"  (1.549 m)     Head Circumference      Peak Flow      Pain Score 10     Pain Loc      Pain Edu?      Excl. in [place]?     Constitutional: Alert and oriented. Well appearing and in no acute distress. Eyes: Conjunctivae are normal. PERRL. EOMI. Head: Atraumatic. Nose: No congestion/rhinnorhea. Mouth/Throat: Mucous membranes are moist.  Oropharynx non-erythematous. Neck: No stridor.   Cardiovascular: Normal rate, regular rhythm. Grossly normal heart sounds.  Good peripheral circulation. Respiratory: Normal respiratory effort.  No retractions. Lungs CTAB. Musculoskeletal: there is no gross deformity noted of the left knee however  there is moderate degenerative change appearance since. No erythema. Moderate crepitus. Range of motion is restricted secondary to pain. No effusion present. Neurologic:  Normal speech and language. No gross focal neurologic deficits are appreciated.  Skin:  Skin is warm, dry and intact. No rash noted.  No ecchymosis, erythema or abrasions noted. Psychiatric: Mood and affect are normal. Speech and behavior are normal.  ____________________________________________   LABS (all labs ordered are listed, but only abnormal results are displayed)  Labs Reviewed - No data to display ____________________________________________   RADIOLOGY  Dg Knee Complete 4 Views Left  Result Date: 08/18/2017 CLINICAL DATA:  51 year old female with a history of leg pain EXAM: LEFT KNEE - COMPLETE 4+ VIEW COMPARISON:  None. FINDINGS: No acute fracture line identified. Bony fragmentation at the tibial tuberosity with well defined margin and no evidence of fracture line.  Mild soft tissue swelling. No joint effusion. No radiopaque foreign body. IMPRESSION: Negative for acute bony abnormality. Changes of tibial tuberosity apophysitis Electronically Signed   By: Corrie Mckusick D.O.   On: 08/18/2017 14:18    ____________________________________________   PROCEDURES  Procedure(s) performed: None  Procedures  Critical Care performed: No  ____________________________________________   INITIAL IMPRESSION / ASSESSMENT AND PLAN / ED COURSE  Patient was placed in a knee immobilizer. She is also given meloxicam 15 mg with a prescription to continue daily. She currently has a cane that she uses for walking. She is to follow-up with her PCP if any continued problems.  ____________________________________________   FINAL CLINICAL IMPRESSION(S) / ED DIAGNOSES  Final diagnoses:  Acute pain of left knee  Arthritis     ED Discharge Orders        Ordered    meloxicam (MOBIC) 7.5 MG tablet  Daily     08/18/17  1435       Note:  This document was prepared using Dragon voice recognition software and may include unintentional dictation errors.    Johnn Hai, PA-C 08/18/17 1441    Carrie Mew, MD 08/18/17 1520

## 2017-08-18 NOTE — Discharge Instructions (Signed)
Wear knee  immobilizer for added support and protection. Continue using your  cane. Follow up with her primary care doctor if any continued problems. Begin taking meloxicam 15 mg daily with food.

## 2017-08-18 NOTE — ED Triage Notes (Signed)
Pt c/o painful left knee and swelling to left knee. Hx of same side knee injury. Pt wearing brace and using cane. Pt alert and oriented X4, active, cooperative, pt in NAD. RR even and unlabored, color WNL.

## 2017-08-23 ENCOUNTER — Ambulatory Visit: Payer: Medicaid Other | Admitting: Gastroenterology

## 2017-08-26 ENCOUNTER — Encounter: Payer: Self-pay | Admitting: Family Medicine

## 2017-08-26 ENCOUNTER — Ambulatory Visit (INDEPENDENT_AMBULATORY_CARE_PROVIDER_SITE_OTHER): Payer: Medicaid Other | Admitting: Family Medicine

## 2017-08-26 VITALS — BP 122/80 | HR 85 | Temp 98.1°F | Resp 16 | Wt 168.0 lb

## 2017-08-26 DIAGNOSIS — Z23 Encounter for immunization: Secondary | ICD-10-CM | POA: Diagnosis not present

## 2017-08-26 DIAGNOSIS — K209 Esophagitis, unspecified without bleeding: Secondary | ICD-10-CM

## 2017-08-26 DIAGNOSIS — M9390 Osteochondropathy, unspecified of unspecified site: Secondary | ICD-10-CM | POA: Diagnosis not present

## 2017-08-26 DIAGNOSIS — K2970 Gastritis, unspecified, without bleeding: Secondary | ICD-10-CM | POA: Diagnosis not present

## 2017-08-26 NOTE — Assessment & Plan Note (Addendum)
Seen on the xray of the LEFT knee from 08/18/17; to ortho

## 2017-08-26 NOTE — Patient Instructions (Addendum)
We'll have you see the orthopaedist Do wear the brace as recommended by the ER doctor Be careful to not fall Use tylenol per package directions  If you have not heard anything from my staff in a week about any orders/referrals/studies from today, please contact us here to follow-up [phone]  Knee Pain, Adult Many things can cause knee pain. The pain often goes away on its own with time and rest. If the pain does not go away, tests may be done to find out what is causing the pain. Follow these instructions at home: Activity  Rest your knee.  Do not do things that cause pain.  Avoid activities where both feet leave the ground at the same time (high-impact activities). Examples are running, jumping rope, and doing jumping jacks. General instructions  Take medicines only as told by your doctor.  Raise (elevate) your knee when you are resting. Make sure your knee is higher than your heart.  Sleep with a pillow under your knee.  If told, put ice on the knee: ? Put ice in a plastic bag. ? Place a towel between your skin and the bag. ? Leave the ice on for 20 minutes, 2-3 times a day.  Ask your doctor if you should wear an elastic knee support.  Lose weight if you are overweight. Being overweight can make your knee hurt more.  Do not use any tobacco products. These include cigarettes, chewing tobacco, or electronic cigarettes. If you need help quitting, ask your doctor. Smoking may slow down healing. Contact a doctor if:  The pain does not stop.  The pain changes or gets worse.  You have a fever along with knee pain.  Your knee gives out or locks up.  Your knee swells, and becomes worse. Get help right away if:  Your knee feels warm.  You cannot move your knee.  You have very bad knee pain.  You have chest pain.  You have trouble breathing. Summary  Many things can cause knee pain. The pain often goes away on its own with time and rest.  Avoid activities  that put stress on your knee. These include running and jumping rope.  Get help right away if you cannot move your knee, or if your knee feels warm, or if you have trouble breathing. This information is not intended to replace advice given to you by your health care provider. Make sure you discuss any questions you have with your health care provider. Document Released: 11/27/2008 Document Revised: 08/25/2016 Document Reviewed: 08/25/2016 Elsevier Interactive Patient Education  2017 [employer].

## 2017-08-26 NOTE — Progress Notes (Signed)
BP 122/80   Pulse 85   Temp 98.1 F (36.7 C) (Oral)   Resp 16   Wt 168 lb (76.2 kg)   LMP 05/09/1993 (Approximate)   SpO2 99%   BMI 31.74 kg/m    Subjective:    Patient ID: Kristina Reed, female    DOB: [birth year], 51 y.o.   MRN: [id number]  HPI: Kristina Reed is a 51 y.o. female  Chief Complaint  Patient presents with  . Knee Pain    left, went to ER she states they did xray and dx her with bad arthritis    HPI She is here for ER follow-up She has LEFT knee pain A few days before she went to the ER, her landlady and boyfriend were at her home; she was limping and the knee was swollen; went to the ER to get chekced out; had xrays done They put her in a knee immobilizer  Left knee xrays 08/18/17 EXAM: LEFT KNEE - COMPLETE 4+ VIEW  COMPARISON:  None.  FINDINGS: No acute fracture line identified. Bony fragmentation at the tibial tuberosity with well defined margin and no evidence of fracture line. Mild soft tissue swelling. No joint effusion. No radiopaque foreign body.  IMPRESSION: Negative for acute bony abnormality.  Changes of tibial tuberosity apophysitis   Electronically Signed   By: Corrie Mckusick D.O.   On: 08/18/2017 14:18  Previous surgery in the left knee, torn ligament She is taking meloxicam daily She accidentallly threw the prescription away  Dr. Vicente Males had to do another EGD; she isn't sure what they found; she goes back on 08/30/17; moderate GERD on the study from February 25, 2017  Depression screen [facility] 2/9 08/26/2017 04/27/2017 03/04/2017 01/21/2017 12/14/2016  Decreased Interest 0 0 0 0 0  Down, Depressed, Hopeless 1 0 1 0 0  PHQ - 2 Score 1 0 1 0 0  Altered sleeping - - - - -  Tired, decreased energy - - - - -  Change in appetite - - - - -  Feeling bad or failure about yourself  - - - - -  Trouble concentrating - - - - -  Moving slowly or fidgety/restless - - - - -  Suicidal thoughts - - - - -  PHQ-9 Score - - - - -  Difficult doing  work/chores - - - - -    Relevant past medical, surgical, family and social history reviewed Past Medical History:  Diagnosis Date  . Anxiety   . Arthritis    right knee and right elbow  . Cancer ([place])    Cervical CA with partial hysterectomy.  . Cognitive impairment, mild, so stated   . Depression   . Diverticulosis   . Gallstones   . GERD (gastroesophageal reflux disease)   . Hx of cervical cancer 01/30/2016  . Hypertension   . Hypothyroidism   . Sleep apnea    does not use a C-PAP  . Status post partial hysterectomy    Due to Cervical CA  . Vaginal inclusion cyst    Past Surgical History:  Procedure Laterality Date  . ABDOMINAL HYSTERECTOMY     partial hysterectomy  . BREAST BIOPSY Bilateral 01/01/2016   [name] cores. Fibroadenomas  . COLONOSCOPY WITH PROPOFOL N/A 06/27/2015   Procedure: COLONOSCOPY WITH PROPOFOL;  Surgeon: Josefine Class, MD;  Location: [facility] ENDOSCOPY;  Service: Endoscopy;  Laterality: N/A;  . ESOPHAGOGASTRODUODENOSCOPY (EGD) WITH PROPOFOL N/A 07/22/2017   Procedure: ESOPHAGOGASTRODUODENOSCOPY (EGD) WITH PROPOFOL;  Surgeon: Jonathon Bellows, MD;  Location: [facility] ENDOSCOPY;  Service: Gastroenterology;  Laterality: N/A;  . KNEE ARTHROSCOPY Right 07/22/2016   Procedure: ARTHROSCOPY KNEE debridement microfracture;  Surgeon: Leanor Kail, MD;  Location: [facility] ORS;  Service: Orthopedics;  Laterality: Right;  . TUBAL LIGATION     Family History  Problem Relation Age of Onset  . Cancer Father        unknown  . Diabetes Mother   . Breast cancer Maternal Aunt   . Cancer Maternal Grandmother        cancer?  . Cancer Paternal Grandmother        cancer?  . Cancer Paternal Grandfather        unknown   Social History   Tobacco Use  . Smoking status: Never Smoker  . Smokeless tobacco: Never Used  Substance Use Topics  . Alcohol use: No  . Drug use: No    Interim medical history since last visit reviewed. Allergies and medications reviewed  Review of  Systems Per HPI unless specifically indicated above     Objective:    BP 122/80   Pulse 85   Temp 98.1 F (36.7 C) (Oral)   Resp 16   Wt 168 lb (76.2 kg)   LMP 05/09/1993 (Approximate)   SpO2 99%   BMI 31.74 kg/m   Wt Readings from Last 3 Encounters:  08/26/17 168 lb (76.2 kg)  08/18/17 168 lb (76.2 kg)  07/22/17 168 lb (76.2 kg)    Physical Exam  Constitutional: She appears well-developed and well-nourished. No distress.  Cardiovascular: Normal rate.  Pulmonary/Chest: Effort normal.  Musculoskeletal:       Left knee: She exhibits swelling. She exhibits no ecchymosis and no erythema. Tenderness found. Medial joint line, lateral joint line and patellar tendon tenderness noted.  Skin: No bruising noted.    Results for orders placed or performed during the hospital encounter of 07/22/17  Surgical pathology  Result Value Ref Range   SURGICAL PATHOLOGY      Surgical Pathology CASE: [id number] PATIENT: [facility] Report     SPECIMEN SUBMITTED: A. Stomach; cbx B. Esophagus, distal, R/O Barrett's; cbx C. Esophagus, random, R/O EOE; bx's  CLINICAL HISTORY: None provided  PRE-OPERATIVE DIAGNOSIS: Dysphagia R13.10  POST-OPERATIVE DIAGNOSIS: None provided.     DIAGNOSIS: A. STOMACH; COLD BIOPSY: - ANTRAL MUCOSA WITH CHANGES CONSISTENT WITH HEALING MUCOSAL INJURY. - NEGATIVE FOR H. PYLORI, DYSPLASIA, AND MALIGNANCY.  B. ESOPHAGUS, DISTAL; COLD BIOPSY: - GASTROESOPHAGITIS. - UP TO 40 INTRAEPITHELIAL EOSINOPHILS IN A HIGH-POWER FIELD WITH EOSINOPHILIC MICROABSCESS FORMATION. - PANCREATIC ACINAR CELL METAPLASIA. - NEGATIVE FOR INTESTINAL METAPLASIA, DYSPLASIA, AND MALIGNANCY.  C. ESOPHAGUS; RANDOM BIOPSY: - SQUAMOUS MUCOSA WITH AN INCREASE IN INTRAEPITHELIAL EOSINOPHILS INVOLVING ALL BIOPSY FRAGMENTS, SEE COMMENT. - UP TO 55 EOSINOPHILS IN A HIGH-POWER FIELD WITH EOSINOPHILIC MICROABSCES S FORMATION. - NEGATIVE FOR DYSPLASIA AND  MALIGNANCY.  Comment: Given the distribution and density of intraepithelial eosinophils eosinophilc esophagitis is a diagnostic consideration. Correlation with endoscopic findings is required.  GROSS DESCRIPTION:  A. Labeled: C BX of stomach  Tissue fragment(s): 2  Size: 0.2 and 0.4 cm  Description: pink fragments  Entirely submitted in 1 cassette(s).   B. Labeled: C BX distal esophagus rule out Barrett's  Tissue fragment(s): 3  Size: 0.3-0.4 cm  Description: tan fragments  Entirely submitted in 1 cassette(s).  C. Labeled: random esophageal biopsy rule out EOE  Tissue fragment(s): multiple  Size: aggregate, 1.4 x 0.4 x 0.1 cm  Description: pink-tan  fragments  Entirely submitted in 1 cassette(s).  Final Diagnosis performed by Quay Burow, MD.  Electronically signed 07/23/2017 10:07:18AM    The electronic signature indicates that the named Attending Pathologist has evaluated the specimen  Technical  component performed at [facility], [address], [place], [place] [address] Lab: [phone] Dir: Rush Farmer, MD, MMM  Professional component performed at [facility], [facility], [place], [place], [place] [address] Lab: [phone] Dir: Dellia Nims. Reuel Derby, MD        Assessment & Plan:   Problem List Items Addressed This Visit      Digestive   Gastroesophagitis    Keep f/u with GI        Musculoskeletal and Integument   Apophysitis    Seen on the xray of the LEFT knee from 08/18/17; to ortho      Relevant Orders   Ambulatory referral to Orthopedic Surgery    Other Visit Diagnoses    Needs flu shot    -  Primary   Relevant Orders   Flu Vaccine QUAD 6+ mos PF IM (Fluarix Quad PF) (Completed)       Follow up plan: No Follow-up on file.  An after-visit summary was printed and given to the patient at [place].  Please see the patient instructions which may contain other information and recommendations beyond what is  mentioned above in the assessment and plan.  No orders of the defined types were placed in this encounter.   Orders Placed This Encounter  Procedures  . Flu Vaccine QUAD 6+ mos PF IM (Fluarix Quad PF)  . Ambulatory referral to Orthopedic Surgery

## 2017-08-28 NOTE — Assessment & Plan Note (Signed)
Keep f/u with GI

## 2017-08-30 ENCOUNTER — Ambulatory Visit: Payer: Medicaid Other | Admitting: Gastroenterology

## 2017-09-02 ENCOUNTER — Ambulatory Visit (INDEPENDENT_AMBULATORY_CARE_PROVIDER_SITE_OTHER): Payer: Medicaid Other | Admitting: Gastroenterology

## 2017-09-02 ENCOUNTER — Other Ambulatory Visit: Payer: Self-pay

## 2017-09-02 ENCOUNTER — Encounter: Payer: Self-pay | Admitting: Gastroenterology

## 2017-09-02 VITALS — BP 122/84 | HR 68 | Temp 98.0°F | Ht 61.0 in | Wt 168.0 lb

## 2017-09-02 DIAGNOSIS — K200 Eosinophilic esophagitis: Secondary | ICD-10-CM

## 2017-09-02 DIAGNOSIS — K625 Hemorrhage of anus and rectum: Secondary | ICD-10-CM | POA: Diagnosis not present

## 2017-09-02 MED ORDER — FLUTICASONE PROPIONATE HFA 220 MCG/ACT IN AERO: INHALATION_SPRAY | RESPIRATORY_TRACT | 11 refills | Status: DC

## 2017-09-02 MED ORDER — OMEPRAZOLE 40 MG PO CPDR: 40.0000 mg | DELAYED_RELEASE_CAPSULE | Freq: Every day | ORAL | 2 refills | Status: DC

## 2017-09-02 NOTE — Progress Notes (Signed)
Jonathon Bellows MD, MRCP(U.K) [place]  [place], [place] [address]  Main: [phone]  Fax: [phone]   Primary Care Physician: [name], MD  Primary Gastroenterologist:  Dr. Jonathon Bellows   No chief complaint on file.   HPI: Kristina Reed is a 51 y.o. female   Summary of history   She used to be a patient of [facility] and was last seen on 02/2017 . She had been seen for nausea and vomiting after eating greasy foods, rectal bleeding.At the last visit had issues with dysphagia. Was not on a PPi at that time. Plan at last visit was to obtain a Barium swallow which showed moderate GERD. Was commenced on omeprazole 20 mg once a day . Subsequently referred to ENT.   EGD: 02/15/14 - reflux esophagitis.Last colonoscopy was in 06/2015 and was found to have diverticulosis and internal hemorrhoids.   She switched providers and saw me in 07/2018 for dysphagia .  Occurs everytime she eats, liquids goes down well, Points to her neck, says food goes the wrong way sometimes. Takes 40 mg of omeprazole everyday . 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 She eats steak , all other meat . Meat more likely gets stuck. Bread goes down well.     Interval history  07/2017-09/02/2017   EGD 07/22/17 - showed normal esophagus with a tongue of salmon colored mucosa--biopsies showed 40 eosinophils phpf .No barrettes esophagus.   08/03/17- modified barium swallow -no abnormalities seen    She says that she had some diarrhea bloody bowel movements yesterday,never happended before, Denies any weight loss. No family history of colon cancer. She has had colonoscopy many many years back.    Current Outpatient Medications  Medication Sig Dispense Refill  . docusate sodium (STOOL SOFTENER) 100 MG capsule Take 100 mg by mouth.    . escitalopram (LEXAPRO) 10 MG tablet Take 1 tablet (10 mg total) by mouth daily.    Marland Kitchen ipratropium (ATROVENT) 0.06 % nasal spray Place 2 sprays into both nostrils  2 (two) times daily.    Marland Kitchen levothyroxine (SYNTHROID, LEVOTHROID) 75 MCG tablet Take 1 tablet (75 mcg total) by mouth daily. 30 tablet 5  . lubiprostone (AMITIZA) 24 MCG capsule Take by mouth.    . meloxicam (MOBIC) 15 MG tablet Take by mouth.    . metoCLOPramide (REGLAN) 10 MG tablet Take 10 mg by mouth.    Marland Kitchen omeprazole (PRILOSEC) 40 MG capsule Take 40 mg by mouth daily.    . potassium chloride (K-DUR,KLOR-CON) 10 MEQ tablet Take by mouth.    . ranitidine (ZANTAC) 150 MG tablet Take 150 mg by mouth.    . traZODone (DESYREL) 50 MG tablet Take 50 mg by mouth.     No current facility-administered medications for this visit.     Allergies as of 09/02/2017 - Review Complete 08/26/2017  Allergen Reaction Noted  . Percocet [oxycodone-acetaminophen] Palpitations 01/20/2015    ROS:  General: Negative for anorexia, weight loss, fever, chills, fatigue, weakness. ENT: Negative for hoarseness, difficulty swallowing , nasal congestion. CV: Negative for chest pain, angina, palpitations, dyspnea on exertion, peripheral edema.  Respiratory: Negative for dyspnea at rest, dyspnea on exertion, cough, sputum, wheezing.  GI: See history of present illness. GU:  Negative for dysuria, hematuria, urinary incontinence, urinary frequency, nocturnal urination.  Endo: Negative for unusual weight change.    Physical Examination:   LMP 05/09/1993 (Approximate)   General: Well-nourished, well-developed in no acute distress.  Eyes: No icterus. Conjunctivae pink. Mouth: Oropharyngeal mucosa moist and pink , no lesions erythema or exudate. Lungs: Clear to auscultation bilaterally. Non-labored. Heart: Regular rate and rhythm, no murmurs rubs or gallops.  Abdomen: Bowel sounds are normal, nontender, nondistended, no hepatosplenomegaly or masses, no abdominal bruits or hernia , no rebound or guarding.   Extremities: Left leg in cast Neuro: Alert and oriented x 3.  Grossly intact. Skin: Warm and dry, no jaundice.     Psych: Alert and cooperative, normal mood and affect.   Imaging Studies: Dg Knee Complete 4 Views Left  Result Date: 08/18/2017 CLINICAL DATA:  51 year old female with a history of leg pain EXAM: LEFT KNEE - COMPLETE 4+ VIEW COMPARISON:  None. FINDINGS: No acute fracture line identified. Bony fragmentation at the tibial tuberosity with well defined margin and no evidence of fracture line. Mild soft tissue swelling. No joint effusion. No radiopaque foreign body. IMPRESSION: Negative for acute bony abnormality. Changes of tibial tuberosity apophysitis Electronically Signed   By: Corrie Mckusick D.O.   On: 08/18/2017 14:18   Dg Swallowing Func-speech Pathology  Result Date: 08/24/2017 Please refer to "Notes" tab for Speech Pathology notes.   Assessment and Plan:   Kristina Reed is a 51 y.o. y/o female here to follow up for dysphagia, GERD. EGD with biopsies confirm eosinophilic esophagitis . She also had a few episodes of rectal bleeding recently.   1. Commence on Fluticasone inhaler swallowed for 6 weeks  2. Allergy testing 3. Start PPI (ordered previously but not started) 4. Obtain dentures   5. Diagnostic colonoscopy   I have discussed alternative options, risks & benefits,  which include, but are not limited to, bleeding, infection, perforation,respiratory complication & drug reaction.  The patient agrees with this plan & written consent will be obtained.    Dr Jonathon Bellows  MD,MRCP [facility]) Follow up in 10 weeks

## 2017-09-03 ENCOUNTER — Encounter: Payer: Self-pay | Admitting: Family Medicine

## 2017-09-03 ENCOUNTER — Ambulatory Visit (INDEPENDENT_AMBULATORY_CARE_PROVIDER_SITE_OTHER): Payer: Medicaid Other | Admitting: Family Medicine

## 2017-09-03 VITALS — BP 124/74 | HR 66 | Temp 98.1°F | Resp 14 | Ht 61.75 in | Wt 165.5 lb

## 2017-09-03 DIAGNOSIS — K200 Eosinophilic esophagitis: Secondary | ICD-10-CM

## 2017-09-03 DIAGNOSIS — K625 Hemorrhage of anus and rectum: Secondary | ICD-10-CM

## 2017-09-03 DIAGNOSIS — M25562 Pain in left knee: Secondary | ICD-10-CM

## 2017-09-03 DIAGNOSIS — N644 Mastodynia: Secondary | ICD-10-CM

## 2017-09-03 DIAGNOSIS — Z0000 Encounter for general adult medical examination without abnormal findings: Secondary | ICD-10-CM | POA: Insufficient documentation

## 2017-09-03 DIAGNOSIS — Z0001 Encounter for general adult medical examination with abnormal findings: Secondary | ICD-10-CM

## 2017-09-03 DIAGNOSIS — L900 Lichen sclerosus et atrophicus: Secondary | ICD-10-CM

## 2017-09-03 HISTORY — DX: Eosinophilic esophagitis: K20.0

## 2017-09-03 LAB — COMPLETE METABOLIC PANEL WITH GFR
AG Ratio: 1.5 (calc) (ref 1.0–2.5)
ALBUMIN MSPROF: 4.2 g/dL (ref 3.6–5.1)
ALT: 16 U/L (ref 6–29)
AST: 14 U/L (ref 10–35)
Alkaline phosphatase (APISO): 71 U/L (ref 33–115)
BILIRUBIN TOTAL: 0.5 mg/dL (ref 0.2–1.2)
BUN: 9 mg/dL (ref 7–25)
CHLORIDE: 101 mmol/L (ref 98–110)
CO2: 27 mmol/L (ref 20–32)
CREATININE: 0.84 mg/dL (ref 0.50–1.10)
Calcium: 9.4 mg/dL (ref 8.6–10.2)
GFR, EST AFRICAN AMERICAN: 95 mL/min/{1.73_m2} (ref >=60)
GFR, Est Non African American: 82 mL/min/{1.73_m2} (ref >=60)
GLOBULIN: 2.8 g/dL (ref 1.9–3.7)
GLUCOSE: 90 mg/dL (ref 65–99)
Potassium: 4 mmol/L (ref 3.5–5.3)
SODIUM: 137 mmol/L (ref 135–146)
TOTAL PROTEIN: 7 g/dL (ref 6.1–8.1)

## 2017-09-03 LAB — CBC WITH DIFFERENTIAL/PLATELET
BASOS PCT: 0.8 %
Basophils Absolute: 48 cells/uL (ref 0–200)
EOS ABS: 348 {cells}/uL (ref 15–500)
Eosinophils Relative: 5.8 %
HEMATOCRIT: 38 % (ref 35.0–45.0)
HEMOGLOBIN: 13 g/dL (ref 11.7–15.5)
LYMPHS ABS: 2052 {cells}/uL (ref 850–3900)
MCH: 29 pg (ref 27.0–33.0)
MCHC: 34.2 g/dL (ref 32.0–36.0)
MCV: 84.6 fL (ref 80.0–100.0)
MONOS PCT: 6.3 %
MPV: 10.3 fL (ref 7.5–12.5)
NEUTROS ABS: 3174 {cells}/uL (ref 1500–7800)
Neutrophils Relative %: 52.9 %
Platelets: 214 10*3/uL (ref 140–400)
RBC: 4.49 10*6/uL (ref 3.80–5.10)
RDW: 13 % (ref 11.0–15.0)
Total Lymphocyte: 34.2 %
WBC: 6 10*3/uL (ref 3.8–10.8)
WBCMIX: 378 {cells}/uL (ref 200–950)

## 2017-09-03 LAB — LIPID PANEL
CHOL/HDL RATIO: 4 (calc) (ref <5.0)
Cholesterol: 201 mg/dL — ABNORMAL HIGH (ref <200)
HDL: 50 mg/dL — ABNORMAL LOW (ref >50)
LDL CHOLESTEROL (CALC): 123 mg/dL — AB
Non-HDL Cholesterol (Calc): 151 mg/dL (calc) — ABNORMAL HIGH (ref <130)
Triglycerides: 159 mg/dL — ABNORMAL HIGH (ref <150)

## 2017-09-03 LAB — TSH: TSH: 1.84 m[IU]/L

## 2017-09-03 MED ORDER — FLUOXETINE HCL 20 MG PO TABS: 20.0000 mg | ORAL_TABLET | Freq: Every day | ORAL | 5 refills | Status: DC

## 2017-09-03 NOTE — Assessment & Plan Note (Signed)
Patient to call GYN for appointment very soon, phone number given

## 2017-09-03 NOTE — Assessment & Plan Note (Signed)
Patient will start treatment for this soon

## 2017-09-03 NOTE — Progress Notes (Signed)
Patient ID: Kristina Reed, female   DOB: [birth year], 51 y.o.   MRN: [id number]   Subjective:   Kristina Reed is a 51 y.o. female here for a complete physical exam  Interim issues since last visit: she was bleeding from her bottom on Wednesday and told Dr. Vicente Males and now she's going to have a colonoscopy; the bleeding slowed down She continues to have pain in her LEFT knee; she did not have the $3 for the orthopaedist co-pay but will get her check soon and then will go She had an EGD and had biopsies done; found eosinophilic esophagitis; she has not yet started the medicine for that, but the prescription was sent; she says that she was told she's allergic to something but doesn't know what  USPSTF grade A and B recommendations Depression:  Depression screen [facility] 2/9 08/26/2017 04/27/2017 03/04/2017 01/21/2017 12/14/2016  Decreased Interest 0 0 0 0 0  Down, Depressed, Hopeless 1 0 1 0 0  PHQ - 2 Score 1 0 1 0 0  Altered sleeping - - - - -  Tired, decreased energy - - - - -  Change in appetite - - - - -  Feeling bad or failure about yourself  - - - - -  Trouble concentrating - - - - -  Moving slowly or fidgety/restless - - - - -  Suicidal thoughts - - - - -  PHQ-9 Score - - - - -  Difficult doing work/chores - - - - -   Hypertension: BP Readings from Last 3 Encounters:  09/03/17 124/74  09/02/17 122/84  08/26/17 122/80   Obesity: Wt Readings from Last 3 Encounters:  09/03/17 165 lb 8 oz (75.1 kg)  09/02/17 168 lb (76.2 kg)  08/26/17 168 lb (76.2 kg)   BMI Readings from Last 3 Encounters:  09/03/17 30.52 kg/m  09/02/17 31.74 kg/m  08/26/17 31.74 kg/m    Skin cancer: no worrisome moles Lung cancer:  n/a Breast cancer: no lumps or bumps but they hurt on top, a few days duration; no nipple discharge Colorectal cancer: scheduled for colonoscopy soon with GI  BRCA gene screening: family hx of breast and/or ovarian cancer and/or metastatic prostate cancer? Both aunts died from breast  cancer; no ovarian cancer; patient had endometrial cancer Cervical cancer screening: was seeing GYN, but has not been back since Feb 02, 2017; had vulvar biopsies HIV, hep B, hep C: hiv negative STD testing and prevention (chl/gon/syphilis): not sexual active Intimate partner violence: no abuse Contraception: n/a Osteoporosis: n/a Fall prevention/vitamin D: discussed  Diet: decent eater; allergic to some sort of food, they took a esophageal biopsy; eosinophilic esophagitis Exercise: not able to do much with sore knee Alcohol: no Tobacco use: no Aspirin: n/a Lipids: check today Lab Results  Component Value Date   CHOL 162 09/02/2016   Lab Results  Component Value Date   HDL 54 09/02/2016   Lab Results  Component Value Date   LDLCALC 82 09/02/2016   Lab Results  Component Value Date   TRIG 129 09/02/2016   Lab Results  Component Value Date   CHOLHDL 3.0 09/02/2016   No results found for: LDLDIRECT Glucose:  Glucose  Date Value Ref Range Status  12/27/2014 90 mg/dL Final    Comment:    65-99 NOTE: New Reference Range  11/20/14   08/29/2014 88 65 - 99 mg/dL Final  02/22/2014 96 65 - 99 mg/dL Final   Glucose, Bld  Date Value Ref Range  Status  10/08/2016 96 65 - 99 mg/dL Final  09/02/2016 75 65 - 99 mg/dL Final  03/10/2016 72 65 - 99 mg/dL Final    Past Medical History:  Diagnosis Date  . Anxiety   . Arthritis    right knee and right elbow  . Cancer ([place])    Cervical CA with partial hysterectomy.  . Cognitive impairment, mild, so stated   . Depression   . Diverticulosis   . Eosinophilic esophagitis 56/97/9480   Biopsy Dec 2018  . Gallstones   . GERD (gastroesophageal reflux disease)   . Hx of cervical cancer 01/30/2016  . Hypertension   . Hypothyroidism   . Sleep apnea    does not use a C-PAP  . Status post partial hysterectomy    Due to Cervical CA  . Vaginal inclusion cyst    Past Surgical History:  Procedure Laterality Date  . ABDOMINAL  HYSTERECTOMY     partial hysterectomy  . BREAST BIOPSY Bilateral 01/01/2016   [name] cores. Fibroadenomas  . COLONOSCOPY WITH PROPOFOL N/A 06/27/2015   Procedure: COLONOSCOPY WITH PROPOFOL;  Surgeon: Josefine Class, MD;  Location: [facility] ENDOSCOPY;  Service: Endoscopy;  Laterality: N/A;  . ESOPHAGOGASTRODUODENOSCOPY (EGD) WITH PROPOFOL N/A 07/22/2017   Procedure: ESOPHAGOGASTRODUODENOSCOPY (EGD) WITH PROPOFOL;  Surgeon: Jonathon Bellows, MD;  Location: [facility] ENDOSCOPY;  Service: Gastroenterology;  Laterality: N/A;  . KNEE ARTHROSCOPY Right 07/22/2016   Procedure: ARTHROSCOPY KNEE debridement microfracture;  Surgeon: Leanor Kail, MD;  Location: [facility] ORS;  Service: Orthopedics;  Laterality: Right;  . TUBAL LIGATION     Family History  Problem Relation Age of Onset  . Cancer Father        unknown  . Diabetes Mother   . Breast cancer Maternal Aunt   . Cancer Maternal Grandmother        cancer?  . Cancer Paternal Grandmother        cancer?  . Cancer Paternal Grandfather        unknown   Social History   Tobacco Use  . Smoking status: Never Smoker  . Smokeless tobacco: Never Used  Substance Use Topics  . Alcohol use: No  . Drug use: No   Review of Systems  Constitutional: Negative for fever and unexpected weight change.  HENT: Negative for mouth sores.   Eyes: Negative for visual disturbance (does need readers).  Cardiovascular: Negative for leg swelling.  Gastrointestinal: Positive for blood in stool.  Genitourinary: Negative for hematuria.  Musculoskeletal: Positive for arthralgias (left knee problem).  Allergic/Immunologic: Positive for food allergies (something per GI after biopsy of esophagus, she doesn't know what).  Neurological: Positive for tremors.  Psychiatric/Behavioral: Negative for dysphoric mood.    Objective:   Vitals:   09/03/17 1030  BP: 124/74  Pulse: 66  Resp: 14  Temp: 98.1 F (36.7 C)  TempSrc: Oral  SpO2: 97%  Weight: 165 lb 8 oz (75.1 kg)   Height: 5' 1.75" (1.568 m)   Body mass index is 30.52 kg/m. Wt Readings from Last 3 Encounters:  09/03/17 165 lb 8 oz (75.1 kg)  09/02/17 168 lb (76.2 kg)  08/26/17 168 lb (76.2 kg)   Physical Exam  Constitutional: She appears well-developed and well-nourished.  HENT:  Head: Normocephalic and atraumatic.  Right Ear: Hearing, tympanic membrane, external ear and ear canal normal.  Left Ear: Hearing, tympanic membrane, external ear and ear canal normal.  Eyes: Conjunctivae and EOM are normal. Right eye exhibits no hordeolum. Left eye exhibits no hordeolum. No  scleral icterus.  Neck: Carotid bruit is not present. No thyromegaly present.  Cardiovascular: Normal rate, regular rhythm, S1 normal, S2 normal and normal heart sounds.  No extrasystoles are present.  Pulmonary/Chest: Effort normal and breath sounds normal. No respiratory distress. Right breast exhibits tenderness. Right breast exhibits no inverted nipple, no mass, no nipple discharge and no skin change. Left breast exhibits tenderness. Left breast exhibits no inverted nipple, no mass, no nipple discharge and no skin change. Breasts are symmetrical.  Bilateral symmetric breast tenderness  Abdominal: Soft. Normal appearance and bowel sounds are normal. She exhibits no distension, no abdominal bruit, no pulsatile midline mass and no mass. There is no hepatosplenomegaly. There is no tenderness. No hernia.  Musculoskeletal: Normal range of motion. She exhibits no edema.  Lymphadenopathy:       Head (right side): No submandibular adenopathy present.       Head (left side): No submandibular adenopathy present.    She has no cervical adenopathy.    She has no axillary adenopathy.  Neurological: She is alert. She displays no tremor. She exhibits normal muscle tone. Gait normal.  Reflex Scores:      Patellar reflexes are 2+ on the right side and 2+ on the left side. Skin: Skin is warm and dry. No bruising and no ecchymosis noted. No  cyanosis. No pallor.  Psychiatric: She has a normal mood and affect. Her speech is normal and behavior is normal. Thought content normal. Her mood appears not anxious. She does not exhibit a depressed mood.   Assessment/Plan:   Problem List Items Addressed This Visit      Digestive   Rectal bleeding    Keep f/u with gastroenterologist; going to have colonoscopy for work-up      Eosinophilic esophagitis    Patient will start treatment for this soon        Musculoskeletal and Integument   Lichen sclerosus et atrophicus    Patient to call GYN for appointment very soon, phone number given        Other   Preventative health care - Primary    USPSTF grade A and B recommendations reviewed with patient; age-appropriate recommendations, preventive care, screening tests, etc discussed and encouraged; healthy living encouraged; see AVS for patient education given to patient      Relevant Orders   CBC with Differential/Platelet   COMPLETE METABOLIC PANEL WITH GFR   Lipid panel   TSH    Other Visit Diagnoses    Breast tenderness in female       suggested that she try evening primrose or vitamin E; let me know if symptoms are not improving over the next 2-4 weeks; mammo UTD   Acute pain of left knee       patient was encouraged to see her orthopaedist as soon as she can       Meds ordered this encounter  Medications  . FLUoxetine (PROZAC) 20 MG tablet    Sig: Take 1 tablet (20 mg total) by mouth daily.    Dispense:  30 tablet    Refill:  5   Orders Placed This Encounter  Procedures  . CBC with Differential/Platelet  . COMPLETE METABOLIC PANEL WITH GFR  . Lipid panel  . TSH    Follow up plan: Return in about 1 year (around 09/03/2018) for complete physical.  An After Visit Summary was printed and given to the patient.

## 2017-09-03 NOTE — Assessment & Plan Note (Signed)
USPSTF grade A and B recommendations reviewed with patient; age-appropriate recommendations, preventive care, screening tests, etc discussed and encouraged; healthy living encouraged; see AVS for patient education given to patient  

## 2017-09-03 NOTE — Patient Instructions (Addendum)
Please do call Dr. Amalia Hailey and schedule an appointment soon Phone: [phone] We'll get labs today Health Maintenance, Female Adopting a healthy lifestyle and getting preventive care can go a long way to promote health and wellness. Talk with your health care provider about what schedule of regular examinations is right for you. This is a good chance for you to check in with your provider about disease prevention and staying healthy. In between checkups, there are plenty of things you can do on your own. Experts have done a lot of research about which lifestyle changes and preventive measures are most likely to keep you healthy. Ask your health care provider for more information. Weight and diet Eat a healthy diet  Be sure to include plenty of vegetables, fruits, low-fat dairy products, and lean protein.  Do not eat a lot of foods high in solid fats, added sugars, or salt.  Get regular exercise. This is one of the most important things you can do for your health. ? Most adults should exercise for at least 150 minutes each week. The exercise should increase your heart rate and make you sweat (moderate-intensity exercise). ? Most adults should also do strengthening exercises at least twice a week. This is in addition to the moderate-intensity exercise.  Maintain a healthy weight  Body mass index (BMI) is a measurement that can be used to identify possible weight problems. It estimates body fat based on height and weight. Your health care provider can help determine your BMI and help you achieve or maintain a healthy weight.  For females 80 years of age and older: ? A BMI below 18.5 is considered underweight. ? A BMI of 18.5 to 24.9 is normal. ? A BMI of 25 to 29.9 is considered overweight. ? A BMI of 30 and above is considered obese.  Watch levels of cholesterol and blood lipids  You should start having your blood tested for lipids and cholesterol at 49 years of age, then have this test  every 5 years.  You may need to have your cholesterol levels checked more often if: ? Your lipid or cholesterol levels are high. ? You are older than 49 years of age. ? You are at high risk for heart disease.  Cancer screening Lung Cancer  Lung cancer screening is recommended for adults 18-69 years old who are at high risk for lung cancer because of a history of smoking.  A yearly low-dose CT scan of the lungs is recommended for people who: ? Currently smoke. ? Have quit within the past 15 years. ? Have at least a 30-pack-year history of smoking. A pack year is smoking an average of one pack of cigarettes a day for 1 year.  Yearly screening should continue until it has been 15 years since you quit.  Yearly screening should stop if you develop a health problem that would prevent you from having lung cancer treatment.  Breast Cancer  Practice breast self-awareness. This means understanding how your breasts normally appear and feel.  It also means doing regular breast self-exams. Let your health care provider know about any changes, no matter how small.  If you are in your 20s or 30s, you should have a clinical breast exam (CBE) by a health care provider every 1-3 years as part of a regular health exam.  If you are 79 or older, have a CBE every year. Also consider having a breast X-ray (mammogram) every year.  If you have a family history of breast cancer,  talk to your health care provider about genetic screening.  If you are at high risk for breast cancer, talk to your health care provider about having an MRI and a mammogram every year.  Breast cancer gene (BRCA) assessment is recommended for women who have family members with BRCA-related cancers. BRCA-related cancers include: ? Breast. ? Ovarian. ? Tubal. ? Peritoneal cancers.  Results of the assessment will determine the need for genetic counseling and BRCA1 and BRCA2 testing.  Cervical Cancer Your health care provider  may recommend that you be screened regularly for cancer of the pelvic organs (ovaries, uterus, and vagina). This screening involves a pelvic examination, including checking for microscopic changes to the surface of your cervix (Pap test). You may be encouraged to have this screening done every 3 years, beginning at age 46.  For women ages 38-65, health care providers may recommend pelvic exams and Pap testing every 3 years, or they may recommend the Pap and pelvic exam, combined with testing for human papilloma virus (HPV), every 5 years. Some types of HPV increase your risk of cervical cancer. Testing for HPV may also be done on women of any age with unclear Pap test results.  Other health care providers may not recommend any screening for nonpregnant women who are considered low risk for pelvic cancer and who do not have symptoms. Ask your health care provider if a screening pelvic exam is right for you.  If you have had past treatment for cervical cancer or a condition that could lead to cancer, you need Pap tests and screening for cancer for at least 20 years after your treatment. If Pap tests have been discontinued, your risk factors (such as having a new sexual partner) need to be reassessed to determine if screening should resume. Some women have medical problems that increase the chance of getting cervical cancer. In these cases, your health care provider may recommend more frequent screening and Pap tests.  Colorectal Cancer  This type of cancer can be detected and often prevented.  Routine colorectal cancer screening usually begins at 49 years of age and continues through 49 years of age.  Your health care provider may recommend screening at an earlier age if you have risk factors for colon cancer.  Your health care provider may also recommend using home test kits to check for hidden blood in the stool.  A small camera at the end of a tube can be used to examine your colon directly  (sigmoidoscopy or colonoscopy). This is done to check for the earliest forms of colorectal cancer.  Routine screening usually begins at age 67.  Direct examination of the colon should be repeated every 5-10 years through 49 years of age. However, you may need to be screened more often if early forms of precancerous polyps or small growths are found.  Skin Cancer  Check your skin from head to toe regularly.  Tell your health care provider about any new moles or changes in moles, especially if there is a change in a mole's shape or color.  Also tell your health care provider if you have a mole that is larger than the size of a pencil eraser.  Always use sunscreen. Apply sunscreen liberally and repeatedly throughout the day.  Protect yourself by wearing long sleeves, pants, a wide-brimmed hat, and sunglasses whenever you are outside.  Heart disease, diabetes, and high blood pressure  High blood pressure causes heart disease and increases the risk of stroke. High blood pressure is  more likely to develop in: ? People who have blood pressure in the high end of the normal range (130-139/85-89 mm Hg). ? People who are overweight or obese. ? People who are African American.  If you are 1-44 years of age, have your blood pressure checked every 3-5 years. If you are 35 years of age or older, have your blood pressure checked every year. You should have your blood pressure measured twice-once when you are at a hospital or clinic, and once when you are not at a hospital or clinic. Record the average of the two measurements. To check your blood pressure when you are not at a hospital or clinic, you can use: ? An automated blood pressure machine at a pharmacy. ? A home blood pressure monitor.  If you are between 71 years and 69 years old, ask your health care provider if you should take aspirin to prevent strokes.  Have regular diabetes screenings. This involves taking a blood sample to check your  fasting blood sugar level. ? If you are at a normal weight and have a low risk for diabetes, have this test once every three years after 49 years of age. ? If you are overweight and have a high risk for diabetes, consider being tested at a younger age or more often. Preventing infection Hepatitis B  If you have a higher risk for hepatitis B, you should be screened for this virus. You are considered at high risk for hepatitis B if: ? You were born in a country where hepatitis B is common. Ask your health care provider which countries are considered high risk. ? Your parents were born in a high-risk country, and you have not been immunized against hepatitis B (hepatitis B vaccine). ? You have HIV or AIDS. ? You use needles to inject street drugs. ? You live with someone who has hepatitis B. ? You have had sex with someone who has hepatitis B. ? You get hemodialysis treatment. ? You take certain medicines for conditions, including cancer, organ transplantation, and autoimmune conditions.  Hepatitis C  Blood testing is recommended for: ? Everyone born from [birth year] through 1963. ? Anyone with known risk factors for hepatitis C.  Sexually transmitted infections (STIs)  You should be screened for sexually transmitted infections (STIs) including gonorrhea and chlamydia if: ? You are sexually active and are younger than 49 years of age. ? You are older than 49 years of age and your health care provider tells you that you are at risk for this type of infection. ? Your sexual activity has changed since you were last screened and you are at an increased risk for chlamydia or gonorrhea. Ask your health care provider if you are at risk.  If you do not have HIV, but are at risk, it may be recommended that you take a prescription medicine daily to prevent HIV infection. This is called pre-exposure prophylaxis (PrEP). You are considered at risk if: ? You are sexually active and do not regularly use condoms  or know the HIV status of your partner(s). ? You take drugs by injection. ? You are sexually active with a partner who has HIV.  Talk with your health care provider about whether you are at high risk of being infected with HIV. If you choose to begin PrEP, you should first be tested for HIV. You should then be tested every 3 months for as long as you are taking PrEP. Pregnancy  If you are premenopausal and you  may become pregnant, ask your health care provider about preconception counseling.  If you may become pregnant, take 400 to 800 micrograms (mcg) of folic acid every day.  If you want to prevent pregnancy, talk to your health care provider about birth control (contraception). Osteoporosis and menopause  Osteoporosis is a disease in which the bones lose minerals and strength with aging. This can result in serious bone fractures. Your risk for osteoporosis can be identified using a bone density scan.  If you are 10 years of age or older, or if you are at risk for osteoporosis and fractures, ask your health care provider if you should be screened.  Ask your health care provider whether you should take a calcium or vitamin D supplement to lower your risk for osteoporosis.  Menopause may have certain physical symptoms and risks.  Hormone replacement therapy may reduce some of these symptoms and risks. Talk to your health care provider about whether hormone replacement therapy is right for you. Follow these instructions at home:  Schedule regular health, dental, and eye exams.  Stay current with your immunizations.  Do not use any tobacco products including cigarettes, chewing tobacco, or electronic cigarettes.  If you are pregnant, do not drink alcohol.  If you are breastfeeding, limit how much and how often you drink alcohol.  Limit alcohol intake to no more than 1 drink per day for nonpregnant women. One drink equals 12 ounces of beer, 5 ounces of wine, or 1 ounces of hard  liquor.  Do not use street drugs.  Do not share needles.  Ask your health care provider for help if you need support or information about quitting drugs.  Tell your health care provider if you often feel depressed.  Tell your health care provider if you have ever been abused or do not feel safe at home. This information is not intended to replace advice given to you by your health care provider. Make sure you discuss any questions you have with your health care provider. Document Released: 03/16/2011 Document Revised: 02/06/2016 Document Reviewed: 06/04/2015 Elsevier Interactive Patient Education  [employer].

## 2017-09-03 NOTE — Assessment & Plan Note (Signed)
Keep f/u with gastroenterologist; going to have colonoscopy for work-up

## 2017-09-06 ENCOUNTER — Telehealth: Payer: Self-pay

## 2017-09-06 NOTE — Telephone Encounter (Signed)
-----   Message from [name], MD sent at 09/06/2017  9:47 AM EST ----- Please also let patient know that her cholesterol is just a little above ideal; encourage her to cut back on fatty foods (hot dogs, sausage, bacon, hamburgers, cheese, etc.); if she doesn't already exercise, ask her to start walking in a safe place; start low and build up gradually until she is walking 30 minutes a day on 5 or more days per week (when her knee gets better, obviously)

## 2017-09-06 NOTE — Telephone Encounter (Signed)
Called pt no answer. LM for pt informing her of information below. CRM created. Labs results routed to [facility].

## 2017-09-11 ENCOUNTER — Emergency Department
Admission: EM | Admit: 2017-09-11 | Discharge: 2017-09-12 | Disposition: A | Discharge status: Home / Self Care | Payer: Medicaid Other | Attending: Emergency Medicine | Admitting: Emergency Medicine

## 2017-09-11 ENCOUNTER — Encounter: Payer: Self-pay | Admitting: Emergency Medicine

## 2017-09-11 DIAGNOSIS — G8929 Other chronic pain: Secondary | ICD-10-CM | POA: Insufficient documentation

## 2017-09-11 DIAGNOSIS — I10 Essential (primary) hypertension: Secondary | ICD-10-CM | POA: Diagnosis not present

## 2017-09-11 DIAGNOSIS — E039 Hypothyroidism, unspecified: Secondary | ICD-10-CM | POA: Insufficient documentation

## 2017-09-11 DIAGNOSIS — F1010 Alcohol abuse, uncomplicated: Secondary | ICD-10-CM | POA: Diagnosis not present

## 2017-09-11 DIAGNOSIS — Z79899 Other long term (current) drug therapy: Secondary | ICD-10-CM | POA: Insufficient documentation

## 2017-09-11 DIAGNOSIS — Y908 Blood alcohol level of 240 mg/100 ml or more: Secondary | ICD-10-CM | POA: Diagnosis not present

## 2017-09-11 DIAGNOSIS — R55 Syncope and collapse: Secondary | ICD-10-CM | POA: Diagnosis present

## 2017-09-11 DIAGNOSIS — F10920 Alcohol use, unspecified with intoxication, uncomplicated: Secondary | ICD-10-CM

## 2017-09-11 LAB — CBC
HEMATOCRIT: 40.2 % (ref 35.0–47.0)
Hemoglobin: 13.7 g/dL (ref 12.0–16.0)
MCH: 29.2 pg (ref 26.0–34.0)
MCHC: 34.1 g/dL (ref 32.0–36.0)
MCV: 85.4 fL (ref 80.0–100.0)
Platelets: 233 10*3/uL (ref 150–440)
RBC: 4.7 MIL/uL (ref 3.80–5.20)
RDW: 13.4 % (ref 11.5–14.5)
WBC: 6 10*3/uL (ref 3.6–11.0)

## 2017-09-11 LAB — COMPREHENSIVE METABOLIC PANEL
ALBUMIN: 4.3 g/dL (ref 3.5–5.0)
ALT: 22 U/L (ref 14–54)
AST: 25 U/L (ref 15–41)
Alkaline Phosphatase: 83 U/L (ref 38–126)
Anion gap: 9 (ref 5–15)
BILIRUBIN TOTAL: 0.7 mg/dL (ref 0.3–1.2)
BUN: 8 mg/dL (ref 6–20)
CHLORIDE: 105 mmol/L (ref 101–111)
CO2: 27 mmol/L (ref 22–32)
CREATININE: 0.81 mg/dL (ref 0.44–1.00)
Calcium: 9.1 mg/dL (ref 8.9–10.3)
GFR calc Af Amer: >60 mL/min (ref >60)
GLUCOSE: 104 mg/dL — AB (ref 65–99)
POTASSIUM: 3.6 mmol/L (ref 3.5–5.1)
Sodium: 141 mmol/L (ref 135–145)
TOTAL PROTEIN: 7.6 g/dL (ref 6.5–8.1)

## 2017-09-11 LAB — URINE DRUG SCREEN, QUALITATIVE (ARMC ONLY)
AMPHETAMINES, UR SCREEN: NOT DETECTED
Barbiturates, Ur Screen: NOT DETECTED
Benzodiazepine, Ur Scrn: NOT DETECTED
CANNABINOID 50 NG, UR ~~LOC~~: NOT DETECTED
Cocaine Metabolite,Ur ~~LOC~~: NOT DETECTED
MDMA (ECSTASY) UR SCREEN: NOT DETECTED
Methadone Scn, Ur: NOT DETECTED
OPIATE, UR SCREEN: NOT DETECTED
PHENCYCLIDINE (PCP) UR S: NOT DETECTED
Tricyclic, Ur Screen: NOT DETECTED

## 2017-09-11 LAB — TROPONIN I

## 2017-09-11 LAB — ETHANOL: ALCOHOL ETHYL (B): 283 mg/dL — AB (ref <10)

## 2017-09-11 NOTE — ED Provider Notes (Signed)
[facility] Emergency Department Provider Note   First MD Initiated Contact with Patient 09/11/17 1900     (approximate)  I have reviewed the triage vital signs and the nursing notes.   HISTORY  Chief Complaint intoxicated    HPI Kristina Reed is a 51 y.o. female presents to the emergency department via EMS with history of "passing out" after "I drank a lot of alcohol".  Patient states her significant other became concerned because she was not responding to him.  Patient denies any complaints at present.   Past Medical History:  Diagnosis Date  . Anxiety   . Arthritis    right knee and right elbow  . Cancer ([place])    Cervical CA with partial hysterectomy.  . Cognitive impairment, mild, so stated   . Depression   . Diverticulosis   . Eosinophilic esophagitis 78/29/5621   Biopsy Dec 2018  . Gallstones   . GERD (gastroesophageal reflux disease)   . Hx of cervical cancer 01/30/2016  . Hypertension   . Hypothyroidism   . Sleep apnea    does not use a C-PAP  . Status post partial hysterectomy    Due to Cervical CA  . Vaginal inclusion cyst     Patient Active Problem List   Diagnosis Date Noted  . Preventative health care 09/03/2017  . Eosinophilic esophagitis 30/86/5784  . Apophysitis 08/26/2017  . 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 Joint pain 07/05/2017  . Osteoarthritis of knee 07/05/2017  . Lichen sclerosus et atrophicus 04/27/2017  . Headache 04/27/2017  . Pharyngoesophageal dysphagia 03/24/2017  . Headache disorder 02/17/2017  . Post-concussion headache 02/17/2017  . Medication monitoring encounter 09/21/2016  . Hematuria 04/23/2016  . Hx of cervical cancer 01/30/2016  . Menopause 01/30/2016  . Status post bilateral breast biopsy 01/07/2016  . Chronic nausea 10/25/2015  . Screening for STD (sexually transmitted disease) 10/25/2015  . External hemorrhoid 05/03/2015  . Rectal bleeding 04/30/2015  . Right knee pain 07/23/2014  .  Depression, major, recurrent, moderate ([place]) 02/02/2014  . Gastroesophagitis 02/02/2014  . Adult hypothyroidism 02/02/2014  . Obstructive apnea 02/02/2014  . Incomplete bladder emptying 12/12/2012  . Tenosynovitis of foot 05/30/2012  . Benign neoplasm of kidney 05/13/2012  . Urge incontinence 05/13/2012  . FOM (frequency of micturition) 05/13/2012  . Chronic pain of right hand 05/05/2012  . Tarsal tunnel syndrome 03/03/2012    Past Surgical History:  Procedure Laterality Date  . ABDOMINAL HYSTERECTOMY     partial hysterectomy  . BREAST BIOPSY Bilateral 01/01/2016   [name] cores. Fibroadenomas  . COLONOSCOPY WITH PROPOFOL N/A 06/27/2015   Procedure: COLONOSCOPY WITH PROPOFOL;  Surgeon: Josefine Class, MD;  Location[facility] ENDOSCOPY;  Service: Endoscopy;  Laterality: N/A;  . ESOPHAGOGASTRODUODENOSCOPY (EGD) WITH PROPOFOL N/A 07/22/2017   Procedure: ESOPHAGOGASTRODUODENOSCOPY (EGD) WITH PROPOFOL;  Surgeon: Jonathon Bellows, MD;  Location: [facility] ENDOSCOPY;  Service: Gastroenterology;  Laterality: N/A;  . KNEE ARTHROSCOPY Right 07/22/2016   Procedure: ARTHROSCOPY KNEE debridement microfracture;  Surgeon: Leanor Kail, MD;  Location: [facility] ORS;  Service: Orthopedics;  Laterality: Right;  . TUBAL LIGATION      Prior to Admission medications   Medication Sig Start Date End Date Taking? Authorizing Provider  dexlansoprazole (DEXILANT) 60 MG capsule Take 60 mg by mouth daily.   Yes [provider]  FLUoxetine (PROZAC) 20 MG tablet Take 1 tablet (20 mg total) by mouth daily. 09/03/17  Yes Lada, Satira Anis, MD  ipratropium (ATROVENT) 0.06 % nasal spray Place  2 sprays into both nostrils 2 (two) times daily.   Yes [provider]  levothyroxine (SYNTHROID, LEVOTHROID) 75 MCG tablet Take 1 tablet (75 mcg total) by mouth daily. 03/04/17  Yes Lada, Satira Anis, MD  fluticasone (FLOVENT HFA) 220 MCG/ACT inhaler Take 2 puffs and swallow twice daily for 8 weeks. **Rinse mouth out after each  use** Patient not taking: Reported on 09/03/2017 09/02/17   Jonathon Bellows, MD    Allergies Percocet [oxycodone-acetaminophen]  Family History  Problem Relation Age of Onset  . Cancer Father        unknown  . Diabetes Mother   . Breast cancer Maternal Aunt   . Cancer Maternal Grandmother        cancer?  . Cancer Paternal Grandmother        cancer?  . Cancer Paternal Grandfather        unknown    Social History Social History   Tobacco Use  . Smoking status: Never Smoker  . Smokeless tobacco: Never Used  Substance Use Topics  . Alcohol use: No  . Drug use: No    Review of Systems Constitutional: No fever/chills Eyes: No visual changes. ENT: No sore throat. Cardiovascular: Denies chest pain. Respiratory: Denies shortness of breath. Gastrointestinal: No abdominal pain.  No nausea, no vomiting.  No diarrhea.  No constipation. Genitourinary: Negative for dysuria. Musculoskeletal: Negative for neck pain.  Negative for back pain. Integumentary: Negative for rash. Neurological: Negative for headaches, focal weakness or numbness. Psychiatric:Positive for EtOH ingestion.  Appears intoxicated   ____________________________________________   PHYSICAL EXAM:  VITAL SIGNS: ED Triage Vitals  Enc Vitals Group     BP      Pulse      Resp      Temp      Temp src      SpO2      Weight      Height      Head Circumference      Peak Flow      Pain Score      Pain Loc      Pain Edu?      Excl. in [place]?     Constitutional: Alert and oriented.  Appears intoxicated. Eyes: Conjunctivae are normal. PERRL. EOMI. Head: Atraumatic.  No evidence of trauma Mouth/Throat: Mucous membranes are moist.  Oropharynx non-erythematous. Neck: No stridor.  Cardiovascular: Normal rate, regular rhythm. Good peripheral circulation. Grossly normal heart sounds. Respiratory: Normal respiratory effort.  No retractions. Lungs CTAB. Gastrointestinal: Soft and nontender. No distention.     Musculoskeletal: No lower extremity tenderness nor edema. No gross deformities of extremities. Neurologic:  Normal speech and language. No gross focal neurologic deficits are appreciated.  Skin:  Skin is warm, dry and intact. No rash noted. Psychiatric: Mood and affect are normal. Speech and behavior are normal.  ____________________________________________   LABS (all labs ordered are listed, but only abnormal results are displayed)  Labs Reviewed  ETHANOL - Abnormal; Notable for the following components:      Result Value   Alcohol, Ethyl (B) 283 (*)    All other components within normal limits  COMPREHENSIVE METABOLIC PANEL - Abnormal; Notable for the following components:   Glucose, Bld 104 (*)    All other components within normal limits  CBC  TROPONIN I  URINE DRUG SCREEN, QUALITATIVE ([facility] ONLY)   ____________________________________________  EKG  ED ECG REPORT I, [place], the attending physician, personally viewed and interpreted this ECG.   Date:  09/12/2017  EKG Time: 7:00 PM  Rate: 70  Rhythm: Normal sinus rhythm  Axis: Normal  Intervals: Normal  ST&T Change: None   Procedures   ____________________________________________   INITIAL IMPRESSION / ASSESSMENT AND PLAN / ED COURSE  As part of my medical decision making, I reviewed the following data within the electronic MEDICAL RECORD [id number] year old female present with above-stated history and physical exam consistent with acute alcohol intoxication.  Laboratory data notable for alcohol of 283. ____________________________________________  FINAL CLINICAL IMPRESSION(S) / ED DIAGNOSES  Final diagnoses:  Alcoholic intoxication without complication ([place])     MEDICATIONS GIVEN DURING THIS VISIT:  Medications - No data to display   ED Discharge Orders    None       Note:  This document was prepared using Dragon voice recognition software and may include unintentional dictation errors.     Gregor Hams, MD 09/12/17 [phone]

## 2017-09-11 NOTE — ED Triage Notes (Signed)
Pt arrived via ems from home with complaints of "passing out" after alcohol consumption. Pt denies any drug use and is alert and oriented upon arrival. No complaints from paient at this time.

## 2017-09-12 NOTE — ED Notes (Signed)
Pt given phone to call a ride 

## 2017-09-23 ENCOUNTER — Ambulatory Visit
Admission: RE | Admit: 2017-09-23 | Discharge: 2017-09-23 | Disposition: A | Discharge status: Home / Self Care | Payer: Medicaid Other | Source: Ambulatory Visit | Attending: Gastroenterology | Admitting: Gastroenterology

## 2017-09-23 ENCOUNTER — Encounter: Payer: Self-pay | Admitting: Anesthesiology

## 2017-09-23 ENCOUNTER — Ambulatory Visit: Payer: Medicaid Other | Admitting: Anesthesiology

## 2017-09-23 ENCOUNTER — Encounter
Admission: RE | Disposition: A | Discharge status: Home / Self Care | Payer: Self-pay | Source: Ambulatory Visit | Attending: Gastroenterology

## 2017-09-23 DIAGNOSIS — G4730 Sleep apnea, unspecified: Secondary | ICD-10-CM | POA: Insufficient documentation

## 2017-09-23 DIAGNOSIS — E039 Hypothyroidism, unspecified: Secondary | ICD-10-CM | POA: Insufficient documentation

## 2017-09-23 DIAGNOSIS — M1711 Unilateral primary osteoarthritis, right knee: Secondary | ICD-10-CM | POA: Diagnosis not present

## 2017-09-23 DIAGNOSIS — F419 Anxiety disorder, unspecified: Secondary | ICD-10-CM | POA: Diagnosis not present

## 2017-09-23 DIAGNOSIS — K640 First degree hemorrhoids: Secondary | ICD-10-CM | POA: Diagnosis not present

## 2017-09-23 DIAGNOSIS — G3184 Mild cognitive impairment, so stated: Secondary | ICD-10-CM | POA: Diagnosis not present

## 2017-09-23 DIAGNOSIS — K5730 Diverticulosis of large intestine without perforation or abscess without bleeding: Secondary | ICD-10-CM | POA: Diagnosis not present

## 2017-09-23 DIAGNOSIS — Z79899 Other long term (current) drug therapy: Secondary | ICD-10-CM | POA: Diagnosis not present

## 2017-09-23 DIAGNOSIS — M19021 Primary osteoarthritis, right elbow: Secondary | ICD-10-CM | POA: Insufficient documentation

## 2017-09-23 DIAGNOSIS — F329 Major depressive disorder, single episode, unspecified: Secondary | ICD-10-CM | POA: Diagnosis not present

## 2017-09-23 DIAGNOSIS — I10 Essential (primary) hypertension: Secondary | ICD-10-CM | POA: Diagnosis not present

## 2017-09-23 DIAGNOSIS — Z8541 Personal history of malignant neoplasm of cervix uteri: Secondary | ICD-10-CM | POA: Insufficient documentation

## 2017-09-23 DIAGNOSIS — K219 Gastro-esophageal reflux disease without esophagitis: Secondary | ICD-10-CM | POA: Diagnosis not present

## 2017-09-23 DIAGNOSIS — Z8719 Personal history of other diseases of the digestive system: Secondary | ICD-10-CM | POA: Diagnosis not present

## 2017-09-23 DIAGNOSIS — K625 Hemorrhage of anus and rectum: Secondary | ICD-10-CM | POA: Diagnosis present

## 2017-09-23 DIAGNOSIS — Z885 Allergy status to narcotic agent status: Secondary | ICD-10-CM | POA: Diagnosis not present

## 2017-09-23 HISTORY — PX: COLONOSCOPY WITH PROPOFOL: SHX5780

## 2017-09-23 SURGERY — COLONOSCOPY WITH PROPOFOL
Anesthesia: General

## 2017-09-23 MED ORDER — FENTANYL CITRATE (PF) 100 MCG/2ML IJ SOLN
INTRAMUSCULAR | Status: AC
  Filled 2017-09-23: qty 2

## 2017-09-23 MED ORDER — PROPOFOL 500 MG/50ML IV EMUL
INTRAVENOUS | Status: DC | PRN
  Administered 2017-09-23: 120 ug/kg/min via INTRAVENOUS

## 2017-09-23 MED ORDER — FENTANYL CITRATE (PF) 100 MCG/2ML IJ SOLN
INTRAMUSCULAR | Status: DC | PRN
  Administered 2017-09-23: 50 ug via INTRAVENOUS

## 2017-09-23 MED ORDER — MIDAZOLAM HCL 2 MG/2ML IJ SOLN
INTRAMUSCULAR | Status: AC
  Filled 2017-09-23: qty 2

## 2017-09-23 MED ORDER — SODIUM CHLORIDE 0.9 % IV SOLN
INTRAVENOUS | Status: DC
  Administered 2017-09-23: 09:00:00 via INTRAVENOUS

## 2017-09-23 MED ORDER — SODIUM CHLORIDE 0.9 % IV SOLN
INTRAVENOUS | Status: DC | PRN
Start: 2017-09-23 — End: 2017-09-23
  Administered 2017-09-23: 09:00:00 via INTRAVENOUS

## 2017-09-23 MED ORDER — MIDAZOLAM HCL 2 MG/2ML IJ SOLN
INTRAMUSCULAR | Status: DC | PRN
  Administered 2017-09-23: 2 mg via INTRAVENOUS

## 2017-09-23 MED ORDER — PROPOFOL 500 MG/50ML IV EMUL
INTRAVENOUS | Status: AC
  Filled 2017-09-23: qty 50

## 2017-09-23 NOTE — Op Note (Signed)
[facility] Patient Name: Kristina Reed Procedure Date: 09/23/2017 9:22 AM MRN: [id number] Account #: [id number] Date of Birth: [birth year] Admit Type: Outpatient Age: 52 Room: [facility] ENDO ROOM 3 Gender: Female Note Status: Finalized Procedure:            Colonoscopy Indications:          Rectal bleeding Providers:            Jonathon Bellows MD, MD Referring MD:         [name] (Referring MD) Medicines:            Monitored Anesthesia Care Complications:        No immediate complications. Procedure:            Pre-Anesthesia Assessment:                       - Prior to the procedure, a History and Physical was                        performed, and patient medications, allergies and                        sensitivities were reviewed. The patient's tolerance of                        previous anesthesia was reviewed.                       - The risks and benefits of the procedure and the                        sedation options and risks were discussed with the                        patient. All questions were answered and informed                        consent was obtained.                       - ASA Grade Assessment: III - A patient with severe                        systemic disease.                       After obtaining informed consent, the colonoscope was                        passed under direct vision. Throughout the procedure,                        the patient's blood pressure, pulse, and oxygen                        saturations were monitored continuously. The                        Colonoscope was introduced through the anus and                        advanced to  the the cecum, identified by the                        appendiceal orifice, IC valve and transillumination.                        The colonoscopy was performed with ease. The patient                        tolerated the procedure well. The quality of the bowel            preparation was good. Findings:      The perianal and digital rectal examinations were normal.      Non-bleeding internal hemorrhoids were found during retroflexion. The       hemorrhoids were small and Grade I (internal hemorrhoids that do not       prolapse).      Multiple small-mouthed diverticula were found in the sigmoid colon.      The exam was otherwise without abnormality on direct and retroflexion       views. Impression:           - Non-bleeding internal hemorrhoids.                       - Diverticulosis in the sigmoid colon.                       - The examination was otherwise normal on direct and                        retroflexion views.                       - No specimens collected. Recommendation:       - Discharge patient to home (with escort).                       - Resume previous diet.                       - Continue present medications.                       - Repeat colonoscopy in 10 years for screening purposes.                       - Return to GI office in 6 weeks. Procedure Code(s):    --- Professional ---                       [phone], Colonoscopy, flexible; diagnostic, including                        collection of specimen(s) by brushing or washing, when                        performed (separate procedure) Diagnosis Code(s):    --- Professional ---                       K64.0, First degree hemorrhoids                       K62.5,  Hemorrhage of anus and rectum                       K57.30, Diverticulosis of large intestine without                        perforation or abscess without bleeding CPT copyright 2016 American Medical Association. All rights reserved. The codes documented in this report are preliminary and upon coder review may  be revised to meet current compliance requirements. Jonathon Bellows, MD Jonathon Bellows MD, MD 09/23/2017 9:41:48 AM This report has been signed electronically. Number of Addenda: 0 Note Initiated On: 09/23/2017 9:22  AM Scope Withdrawal Time: 0 hours 5 minutes 57 seconds  Total Procedure Duration: 0 hours 7 minutes 56 seconds       [facility]

## 2017-09-23 NOTE — Anesthesia Preprocedure Evaluation (Signed)
Anesthesia Evaluation  Patient identified by MRN, date of birth, ID band Patient awake    Reviewed: Allergy & Precautions, H&P , NPO status , Patient's Chart, lab work & pertinent test results, reviewed documented beta blocker date and time   Airway Mallampati: III   Neck ROM: full    Dental  (+) Edentulous Upper, Edentulous Lower   Pulmonary neg pulmonary ROS, sleep apnea ,    Pulmonary exam normal        Cardiovascular Exercise Tolerance: Poor hypertension, negative cardio ROS Normal cardiovascular exam Rhythm:regular Rate:Normal     Neuro/Psych  Headaches, PSYCHIATRIC DISORDERS  Neuromuscular disease negative neurological ROS  negative psych ROS   GI/Hepatic negative GI ROS, Neg liver ROS, GERD  Medicated,  Endo/Other  negative endocrine ROSHypothyroidism   Renal/GU Renal diseasenegative Renal ROS  negative genitourinary   Musculoskeletal   Abdominal   Peds  Hematology negative hematology ROS (+)   Anesthesia Other Findings Past Medical History: No date: Anxiety No date: Arthritis     Comment:  right knee and right elbow No date: Cancer [facility])     Comment:  Cervical CA with partial hysterectomy. No date: Cognitive impairment, mild, so stated No date: Depression No date: Diverticulosis 46/27/0350: Eosinophilic esophagitis     Comment:  Biopsy Dec 2018 No date: Gallstones No date: GERD (gastroesophageal reflux disease) 01/30/2016: Hx of cervical cancer No date: Hypertension No date: Hypothyroidism No date: Sleep apnea     Comment:  does not use a C-PAP No date: Status post partial hysterectomy     Comment:  Due to Cervical CA No date: Vaginal inclusion cyst Past Surgical History: No date: ABDOMINAL HYSTERECTOMY     Comment:  partial hysterectomy 01/01/2016: BREAST BIOPSY; Bilateral     Comment:  [name] cores. Fibroadenomas 06/27/2015: COLONOSCOPY WITH PROPOFOL; N/A     Comment:  Procedure: COLONOSCOPY  WITH PROPOFOL;  Surgeon: Josefine Class, MD;  Location: [facility] ENDOSCOPY;  Service:               Endoscopy;  Laterality: N/A; 07/22/2017: ESOPHAGOGASTRODUODENOSCOPY (EGD) WITH PROPOFOL; N/A     Comment:  Procedure: ESOPHAGOGASTRODUODENOSCOPY (EGD) WITH               PROPOFOL;  Surgeon: Jonathon Bellows, MD;  Location: [facility]               ENDOSCOPY;  Service: Gastroenterology;  Laterality: N/A; 07/22/2016: KNEE ARTHROSCOPY; Right     Comment:  Procedure: ARTHROSCOPY KNEE debridement microfracture;                Surgeon: Leanor Kail, MD;  Location: [facility] ORS;                Service: Orthopedics;  Laterality: Right; No date: TUBAL LIGATION   Reproductive/Obstetrics negative OB ROS                             Anesthesia Physical Anesthesia Plan  ASA: III  Anesthesia Plan: General   Post-op Pain Management:    Induction:   PONV Risk Score and Plan:   Airway Management Planned:   Additional Equipment:   Intra-op Plan:   Post-operative Plan:   Informed Consent: I have reviewed the patients History and Physical, chart, labs and discussed the procedure including the risks, benefits and alternatives for the proposed anesthesia with the patient or authorized  representative who has indicated his/her understanding and acceptance.   Dental Advisory Given  Plan Discussed with: CRNA  Anesthesia Plan Comments:         Anesthesia Quick Evaluation

## 2017-09-23 NOTE — Anesthesia Post-op Follow-up Note (Signed)
Anesthesia QCDR form completed.        

## 2017-09-23 NOTE — Transfer of Care (Signed)
Immediate Anesthesia Transfer of Care Note  Patient: Kristina Reed  Procedure(s) Performed: COLONOSCOPY WITH PROPOFOL (N/A )  Patient Location: PACU  Anesthesia Type:General  Level of Consciousness: awake and sedated  Airway & Oxygen Therapy: Patient Spontanous Breathing and Patient connected to nasal cannula oxygen  Post-op Assessment: Report given to RN and Post -op Vital signs reviewed and stable  Post vital signs: Reviewed and stable  Last Vitals:  Vitals:   09/23/17 0922  BP: 138/86  Pulse: 70  Resp: 16  Temp: (!) 36.2 C  SpO2: 97%    Last Pain:  Vitals:   09/23/17 0922  TempSrc: Tympanic         Complications: No apparent anesthesia complications

## 2017-09-23 NOTE — H&P (Signed)
Kristina Bellows, MD [address], [place], [place], [place], [address] [address], [place], [place], [place], [address] Phone: [phone]  Fax: [phone]  Primary Care Physician:  [name], MD   Pre-Procedure History & Physical: HPI:  Kristina Reed is a 52 y.o. female is here for an colonoscopy.   Past Medical History:  Diagnosis Date  . Anxiety   . Arthritis    right knee and right elbow  . Cancer ([place])    Cervical CA with partial hysterectomy.  . Cognitive impairment, mild, so stated   . Depression   . Diverticulosis   . Eosinophilic esophagitis 35/57/3220   Biopsy Dec 2018  . Gallstones   . GERD (gastroesophageal reflux disease)   . Hx of cervical cancer 01/30/2016  . Hypertension   . Hypothyroidism   . Sleep apnea    does not use a C-PAP  . Status post partial hysterectomy    Due to Cervical CA  . Vaginal inclusion cyst     Past Surgical History:  Procedure Laterality Date  . ABDOMINAL HYSTERECTOMY     partial hysterectomy  . BREAST BIOPSY Bilateral 01/01/2016   [name] cores. Fibroadenomas  . COLONOSCOPY WITH PROPOFOL N/A 06/27/2015   Procedure: COLONOSCOPY WITH PROPOFOL;  Surgeon: Josefine Class, MD;  Location: [facility] ENDOSCOPY;  Service: Endoscopy;  Laterality: N/A;  . ESOPHAGOGASTRODUODENOSCOPY (EGD) WITH PROPOFOL N/A 07/22/2017   Procedure: ESOPHAGOGASTRODUODENOSCOPY (EGD) WITH PROPOFOL;  Surgeon: Kristina Bellows, MD;  Location: [facility] ENDOSCOPY;  Service: Gastroenterology;  Laterality: N/A;  . KNEE ARTHROSCOPY Right 07/22/2016   Procedure: ARTHROSCOPY KNEE debridement microfracture;  Surgeon: Leanor Kail, MD;  Location: [facility] ORS;  Service: Orthopedics;  Laterality: Right;  . TUBAL LIGATION      Prior to Admission medications   Medication Sig Start Date End Date Taking? Authorizing Provider  dexlansoprazole (DEXILANT) 60 MG capsule Take 60 mg by mouth daily.    [provider]  FLUoxetine (PROZAC) 20 MG tablet Take 1 tablet (20 mg total) by  mouth daily. 09/03/17   [name], MD  fluticasone (FLOVENT HFA) 220 MCG/ACT inhaler Take 2 puffs and swallow twice daily for 8 weeks. **Rinse mouth out after each use** Patient not taking: Reported on 09/03/2017 09/02/17   Kristina Bellows, MD  ipratropium (ATROVENT) 0.06 % nasal spray Place 2 sprays into both nostrils 2 (two) times daily.    [provider]  levothyroxine (SYNTHROID, LEVOTHROID) 75 MCG tablet Take 1 tablet (75 mcg total) by mouth daily. 03/04/17   [name], MD    Allergies as of 09/03/2017 - Review Complete 09/03/2017  Allergen Reaction Noted  . Percocet [oxycodone-acetaminophen] Palpitations 01/20/2015    Family History  Problem Relation Age of Onset  . Cancer Father        unknown  . Diabetes Mother   . Breast cancer Maternal Aunt   . Cancer Maternal Grandmother        cancer?  . Cancer Paternal Grandmother        cancer?  . Cancer Paternal Grandfather        unknown    Social History   Socioeconomic History  . Marital status: Legally Separated    Spouse name: Not on file  . Number of children: Not on file  . Years of education: Not on file  . Highest education level: Not on file  Social Needs  . Financial resource strain: Not on file  . Food insecurity - worry: Not on file  .  Food insecurity - inability: Not on file  . Transportation needs - medical: Not on file  . Transportation needs - non-medical: Not on file  Occupational History  . Not on file  Tobacco Use  . Smoking status: Never Smoker  . Smokeless tobacco: Never Used  Substance and Sexual Activity  . Alcohol use: No  . Drug use: No  . Sexual activity: Yes    Birth control/protection: Surgical  Other Topics Concern  . Not on file  Social History Narrative  . Not on file    Review of Systems: See HPI, otherwise negative ROS  Physical Exam: LMP 05/09/1993 (Approximate)  General:   Alert,  pleasant and cooperative in NAD Head:  Normocephalic and atraumatic. Neck:   Supple; no masses or thyromegaly. Lungs:  Clear throughout to auscultation, normal respiratory effort.    Heart:  +S1, +S2, Regular rate and rhythm, No edema. Abdomen:  Soft, nontender and nondistended. Normal bowel sounds, without guarding, and without rebound.   Neurologic:  Alert and  oriented x4;  grossly normal neurologically.  Impression/Plan: Kristina Reed is here for an colonoscopy to be performed for surveillance due to rectal bleeding   Risks, benefits, limitations, and alternatives regarding  colonoscopy have been reviewed with the patient.  Questions have been answered.  All parties agreeable.   Kristina Bellows, MD  09/23/2017, 8:38 AM

## 2017-09-26 NOTE — Anesthesia Postprocedure Evaluation (Signed)
Anesthesia Post Note  Patient: Kristina Reed  Procedure(s) Performed: COLONOSCOPY WITH PROPOFOL (N/A )  Patient location during evaluation: PACU Anesthesia Type: General Level of consciousness: awake and alert Pain management: pain level controlled Vital Signs Assessment: post-procedure vital signs reviewed and stable Respiratory status: spontaneous breathing, nonlabored ventilation, respiratory function stable and patient connected to nasal cannula oxygen Cardiovascular status: blood pressure returned to baseline and stable Postop Assessment: no apparent nausea or vomiting Anesthetic complications: no     Last Vitals:  Vitals:   09/23/17 1010 09/23/17 1020  BP: 120/73   Pulse: (!) 57 62  Resp: 14 14  Temp:    SpO2: 100% 100%    Last Pain:  Vitals:   09/24/17 0740  TempSrc:   PainSc: 0-No pain                 Molli Barrows

## 2017-09-27 ENCOUNTER — Encounter: Payer: Self-pay | Admitting: Gastroenterology

## 2017-10-13 ENCOUNTER — Other Ambulatory Visit: Payer: Self-pay | Admitting: Unknown Physician Specialty

## 2017-10-13 DIAGNOSIS — M25562 Pain in left knee: Secondary | ICD-10-CM

## 2017-10-15 ENCOUNTER — Ambulatory Visit: Payer: Medicaid Other | Admitting: Family Medicine

## 2017-10-22 ENCOUNTER — Telehealth: Payer: Self-pay

## 2017-10-22 MED ORDER — LEVOTHYROXINE SODIUM 75 MCG PO TABS: 75.0000 ug | ORAL_TABLET | Freq: Every day | ORAL | 1 refills | Status: DC

## 2017-10-22 NOTE — Telephone Encounter (Signed)
Called pt she states that she would like all of her prescriptions moved to [facility] (changed in chart) [place] they state that the patient has been getting Sandoz levothyroxine, this is the one on back order. [place] they state that they are not able to get the same manufacture. Can you please seen RX in to [facility] and I will call the pt and let her know the need to come in for labs in 8 weeks. Thanks

## 2017-10-22 NOTE — Telephone Encounter (Signed)
Rx sent thank you

## 2017-10-27 ENCOUNTER — Ambulatory Visit
Admission: RE | Admit: 2017-10-27 | Discharge: 2017-10-27 | Disposition: A | Discharge status: Home / Self Care | Payer: Medicaid Other | Source: Ambulatory Visit | Attending: Unknown Physician Specialty | Admitting: Unknown Physician Specialty

## 2017-10-27 DIAGNOSIS — M769 Unspecified enthesopathy, lower limb, excluding foot: Secondary | ICD-10-CM | POA: Diagnosis not present

## 2017-10-27 DIAGNOSIS — S83242A Other tear of medial meniscus, current injury, left knee, initial encounter: Secondary | ICD-10-CM | POA: Insufficient documentation

## 2017-10-27 DIAGNOSIS — M25562 Pain in left knee: Secondary | ICD-10-CM | POA: Diagnosis present

## 2017-10-27 DIAGNOSIS — M949 Disorder of cartilage, unspecified: Secondary | ICD-10-CM | POA: Diagnosis not present

## 2017-10-27 DIAGNOSIS — X58XXXA Exposure to other specified factors, initial encounter: Secondary | ICD-10-CM | POA: Insufficient documentation

## 2017-11-01 ENCOUNTER — Ambulatory Visit: Payer: Self-pay | Admitting: Gastroenterology

## 2017-11-01 NOTE — Progress Notes (Signed)
Closing out lab/order note open since:  June 2018 

## 2017-11-01 NOTE — Telephone Encounter (Signed)
Pt notified there is no documentation in our chart for a left knee surgery.  Copied from [place] [id number]. Topic: General - Other >> Nov 01, 2017 11:02 AM Yvette Rack wrote: Reason for CRM: patient calling wanting to know what year and which Doctor did her surgery on her left knee

## 2017-11-01 NOTE — Telephone Encounter (Signed)
I don't see that provided in our medical/surgical history Could you check for her in the old system? I don't have any information to provide that isn't going to be able to be found in the chart Thank you

## 2017-11-08 ENCOUNTER — Other Ambulatory Visit: Payer: Self-pay

## 2017-11-08 ENCOUNTER — Encounter
Admission: RE | Admit: 2017-11-08 | Discharge: 2017-11-08 | Disposition: A | Discharge status: Home / Self Care | Payer: Medicaid Other | Source: Ambulatory Visit | Attending: Unknown Physician Specialty | Admitting: Unknown Physician Specialty

## 2017-11-08 DIAGNOSIS — I10 Essential (primary) hypertension: Secondary | ICD-10-CM | POA: Insufficient documentation

## 2017-11-08 DIAGNOSIS — Z01810 Encounter for preprocedural cardiovascular examination: Secondary | ICD-10-CM | POA: Diagnosis present

## 2017-11-08 NOTE — Patient Instructions (Signed)
Your procedure is scheduled on: Wednesday, November 10, 2017 Report to Same Day Surgery on the 2nd floor in the [place]. To find out your arrival time, please call [phone] between 1PM - 3PM on: Tuesday, November 09, 2017  REMEMBER: Instructions that are not followed completely may result in serious medical risk, up to and including death; or upon the discretion of your surgeon and anesthesiologist your surgery may need to be rescheduled.  Do not eat food after midnight the night before your procedure.  No gum chewing or hard candies.  You may however, drink CLEAR liquids up to 2 hours before you are scheduled to arrive at the hospital for your procedure.  Do not drink clear liquids within 2 hours of the start of your surgery.  Clear liquids include: - water  - apple juice without pulp - clear gatorade - black coffee or tea (Do NOT add anything to the coffee or tea) Do NOT drink anything that is not on this list.  No Alcohol for 24 hours before or after surgery.  No Smoking including e-cigarettes for 24 hours prior to surgery. No chewable tobacco products for at least 6 hours prior to surgery. No nicotine patches on the day of surgery.  On the morning of surgery brush your teeth with toothpaste and water, you may rinse your mouth with mouthwash if you wish. Do not swallow any  toothpaste of mouthwash.  Notify your doctor if there is any change in your medical condition (cold, fever, infection).  Do not wear jewelry, make-up, hairpins, clips or nail polish.  Do not wear lotions, powders, or perfumes. You may wear deodorant.  Do not shave 48 hours prior to surgery. Men may shave face and neck.  Contacts and dentures may not be worn into surgery.  Do not bring valuables to the hospital. [facility] is not responsible for any belongings or valuables.   TAKE THESE MEDICATIONS THE MORNING OF SURGERY WITH A SIP OF WATER:  1.  FLUOXETINE 2.  FLOVENT INHALER 3.   LEVOTHYROXINE  Use CHG Soap as directed on instruction sheet.  Use inhalers on the day of surgery and bring to the hospital.  NOW!  Stop Anti-inflammatories such as Advil, Aleve, Ibuprofen, Motrin, Naproxen, Naprosyn, Goodie powder, or aspirin products. (May take Tylenol or Acetaminophen if needed.)  NOW!  Stop ANY OVER THE COUNTER supplements until after surgery.  If you are being discharged the day of surgery, you will not be allowed to drive home. You will need someone to drive you home and stay with you that night.   If you are taking public transportation, you will need to have a responsible adult to with you.  Please call the number above if you have any questions about these instructions.

## 2017-11-10 ENCOUNTER — Encounter: Payer: Self-pay | Admitting: *Deleted

## 2017-11-10 ENCOUNTER — Ambulatory Visit: Payer: Medicaid Other | Admitting: Certified Registered Nurse Anesthetist

## 2017-11-10 ENCOUNTER — Encounter
Admission: RE | Disposition: A | Discharge status: Home / Self Care | Payer: Self-pay | Source: Ambulatory Visit | Attending: Unknown Physician Specialty

## 2017-11-10 ENCOUNTER — Ambulatory Visit
Admission: RE | Admit: 2017-11-10 | Discharge: 2017-11-10 | Disposition: A | Discharge status: Home / Self Care | Payer: Medicaid Other | Source: Ambulatory Visit | Attending: Unknown Physician Specialty | Admitting: Unknown Physician Specialty

## 2017-11-10 DIAGNOSIS — X58XXXA Exposure to other specified factors, initial encounter: Secondary | ICD-10-CM | POA: Diagnosis not present

## 2017-11-10 DIAGNOSIS — E039 Hypothyroidism, unspecified: Secondary | ICD-10-CM | POA: Insufficient documentation

## 2017-11-10 DIAGNOSIS — Z79899 Other long term (current) drug therapy: Secondary | ICD-10-CM | POA: Insufficient documentation

## 2017-11-10 DIAGNOSIS — S83242A Other tear of medial meniscus, current injury, left knee, initial encounter: Secondary | ICD-10-CM | POA: Insufficient documentation

## 2017-11-10 DIAGNOSIS — G4733 Obstructive sleep apnea (adult) (pediatric): Secondary | ICD-10-CM | POA: Insufficient documentation

## 2017-11-10 DIAGNOSIS — F329 Major depressive disorder, single episode, unspecified: Secondary | ICD-10-CM | POA: Insufficient documentation

## 2017-11-10 DIAGNOSIS — F419 Anxiety disorder, unspecified: Secondary | ICD-10-CM | POA: Diagnosis not present

## 2017-11-10 DIAGNOSIS — I10 Essential (primary) hypertension: Secondary | ICD-10-CM | POA: Diagnosis not present

## 2017-11-10 DIAGNOSIS — K219 Gastro-esophageal reflux disease without esophagitis: Secondary | ICD-10-CM | POA: Diagnosis not present

## 2017-11-10 HISTORY — PX: KNEE ARTHROSCOPY WITH MEDIAL MENISECTOMY: SHX5651

## 2017-11-10 SURGERY — ARTHROSCOPY, KNEE, WITH MEDIAL MENISCECTOMY
Anesthesia: General | Laterality: Left

## 2017-11-10 MED ORDER — SODIUM CHLORIDE 0.9 % IJ SOLN
INTRAMUSCULAR | Status: AC
  Filled 2017-11-10: qty 10

## 2017-11-10 MED ORDER — HYDROMORPHONE HCL 1 MG/ML IJ SOLN
0.2500 mg | INTRAMUSCULAR | Status: DC | PRN
  Administered 2017-11-10 (×2): 0.5 mg via INTRAVENOUS

## 2017-11-10 MED ORDER — FAMOTIDINE 20 MG PO TABS
ORAL_TABLET | ORAL | Status: AC
  Administered 2017-11-10: 20 mg via ORAL
  Filled 2017-11-10: qty 1

## 2017-11-10 MED ORDER — DEXAMETHASONE SODIUM PHOSPHATE 10 MG/ML IJ SOLN
INTRAMUSCULAR | Status: AC
  Filled 2017-11-10: qty 1

## 2017-11-10 MED ORDER — LACTATED RINGERS IV SOLN
INTRAVENOUS | Status: DC
  Administered 2017-11-10: 08:00:00 via INTRAVENOUS

## 2017-11-10 MED ORDER — LACTATED RINGERS IR SOLN
Status: DC | PRN
  Administered 2017-11-10: 6000 mL

## 2017-11-10 MED ORDER — LIDOCAINE HCL (PF) 2 % IJ SOLN
INTRAMUSCULAR | Status: AC
  Filled 2017-11-10: qty 10

## 2017-11-10 MED ORDER — LIDOCAINE HCL (CARDIAC) 20 MG/ML IV SOLN
INTRAVENOUS | Status: DC | PRN
  Administered 2017-11-10: 100 mg via INTRAVENOUS

## 2017-11-10 MED ORDER — ONDANSETRON HCL 4 MG/2ML IJ SOLN
INTRAMUSCULAR | Status: DC | PRN
  Administered 2017-11-10: 4 mg via INTRAVENOUS

## 2017-11-10 MED ORDER — HYDROMORPHONE HCL 1 MG/ML IJ SOLN
INTRAMUSCULAR | Status: AC
  Administered 2017-11-10: 0.5 mg via INTRAVENOUS
  Filled 2017-11-10: qty 1

## 2017-11-10 MED ORDER — ACETAMINOPHEN 10 MG/ML IV SOLN
INTRAVENOUS | Status: AC
  Filled 2017-11-10: qty 100

## 2017-11-10 MED ORDER — FAMOTIDINE 20 MG PO TABS
20.0000 mg | ORAL_TABLET | Freq: Once | ORAL | Status: AC
  Administered 2017-11-10: 20 mg via ORAL

## 2017-11-10 MED ORDER — PROPOFOL 10 MG/ML IV BOLUS
INTRAVENOUS | Status: AC
  Filled 2017-11-10: qty 20

## 2017-11-10 MED ORDER — BUPIVACAINE HCL (PF) 0.5 % IJ SOLN
INTRAMUSCULAR | Status: DC | PRN
  Administered 2017-11-10: 11 mL

## 2017-11-10 MED ORDER — EPHEDRINE SULFATE 50 MG/ML IJ SOLN
INTRAMUSCULAR | Status: DC | PRN
  Administered 2017-11-10: 5 mg via INTRAVENOUS

## 2017-11-10 MED ORDER — DEXAMETHASONE SODIUM PHOSPHATE 10 MG/ML IJ SOLN
INTRAMUSCULAR | Status: DC | PRN
  Administered 2017-11-10: 10 mg via INTRAVENOUS

## 2017-11-10 MED ORDER — CEFAZOLIN SODIUM-DEXTROSE 1-4 GM/50ML-% IV SOLN
INTRAVENOUS | Status: DC | PRN
  Administered 2017-11-10: 1 g via INTRAVENOUS

## 2017-11-10 MED ORDER — PROPOFOL 10 MG/ML IV BOLUS
INTRAVENOUS | Status: DC | PRN
  Administered 2017-11-10: 50 mg via INTRAVENOUS
  Administered 2017-11-10: 150 mg via INTRAVENOUS

## 2017-11-10 MED ORDER — PROMETHAZINE HCL 25 MG/ML IJ SOLN: 6.2500 mg | INTRAMUSCULAR | Status: DC | PRN

## 2017-11-10 MED ORDER — CEFAZOLIN SODIUM 1 G IJ SOLR
INTRAMUSCULAR | Status: AC
  Filled 2017-11-10: qty 10

## 2017-11-10 MED ORDER — MIDAZOLAM HCL 2 MG/2ML IJ SOLN: 0.5000 mg | Freq: Once | INTRAMUSCULAR | Status: DC | PRN

## 2017-11-10 MED ORDER — ONDANSETRON HCL 4 MG/2ML IJ SOLN
INTRAMUSCULAR | Status: AC
  Filled 2017-11-10: qty 2

## 2017-11-10 MED ORDER — MEPERIDINE HCL 50 MG/ML IJ SOLN: 6.2500 mg | INTRAMUSCULAR | Status: DC | PRN

## 2017-11-10 MED ORDER — MIDAZOLAM HCL 2 MG/2ML IJ SOLN
INTRAMUSCULAR | Status: DC | PRN
  Administered 2017-11-10: 2 mg via INTRAVENOUS

## 2017-11-10 MED ORDER — FENTANYL CITRATE (PF) 100 MCG/2ML IJ SOLN
INTRAMUSCULAR | Status: DC | PRN
  Administered 2017-11-10 (×2): 50 ug via INTRAVENOUS

## 2017-11-10 MED ORDER — FENTANYL CITRATE (PF) 100 MCG/2ML IJ SOLN
INTRAMUSCULAR | Status: AC
  Filled 2017-11-10: qty 2

## 2017-11-10 MED ORDER — MIDAZOLAM HCL 2 MG/2ML IJ SOLN
INTRAMUSCULAR | Status: AC
  Filled 2017-11-10: qty 2

## 2017-11-10 MED ORDER — ACETAMINOPHEN 10 MG/ML IV SOLN
INTRAVENOUS | Status: DC | PRN
  Administered 2017-11-10: 1000 mg via INTRAVENOUS

## 2017-11-10 SURGICAL SUPPLY — 42 items
ARTHROWAND PARAGON T2 (SURGICAL WAND)
BLADE ABRADER 4.5 (BLADE) ×3 IMPLANT
BLADE SHAVER 4.5X7 STR FR (MISCELLANEOUS) IMPLANT
BLADE SHAVER AGGRES 3.5 (CUTTER) ×3 IMPLANT
BNDG COHESIVE 6X5 TAN STRL LF (GAUZE/BANDAGES/DRESSINGS) ×3 IMPLANT
BNDG ESMARK 6X12 TAN STRL LF (GAUZE/BANDAGES/DRESSINGS) ×3 IMPLANT
BUR ABRADER 4.0 W/FLUTE AQUA (MISCELLANEOUS) IMPLANT
BURR ABRADER 4.0 W/FLUTE AQUA (MISCELLANEOUS)
CHLORAPREP W/TINT 26ML (MISCELLANEOUS) ×3 IMPLANT
CUFF TOURN 24 STER (MISCELLANEOUS) IMPLANT
DRAPE LEGGINS SURG 28X43 STRL (DRAPES) ×3 IMPLANT
DRSG TEGADERM 2-3/8X2-3/4 SM (GAUZE/BANDAGES/DRESSINGS) ×3 IMPLANT
GAUZE SPONGE 4X4 12PLY STRL (GAUZE/BANDAGES/DRESSINGS) ×3 IMPLANT
GLOVE BIO SURGEON STRL SZ8 (GLOVE) ×3 IMPLANT
GLOVE BIOGEL M STRL SZ7.5 (GLOVE) ×3 IMPLANT
GLOVE INDICATOR 8.0 STRL GRN (GLOVE) ×3 IMPLANT
GOWN STRL REUS W/ TWL LRG LVL3 (GOWN DISPOSABLE) ×1 IMPLANT
GOWN STRL REUS W/TWL LRG LVL3 (GOWN DISPOSABLE) ×2
GOWN STRL REUS W/TWL LRG LVL4 (GOWN DISPOSABLE) ×3 IMPLANT
IV LACTATED RINGER IRRG 3000ML (IV SOLUTION) ×6
IV LR IRRIG 3000ML ARTHROMATIC (IV SOLUTION) ×3 IMPLANT
KIT TURNOVER KIT A (KITS) ×3 IMPLANT
MANIFOLD NEPTUNE II (INSTRUMENTS) ×3 IMPLANT
PACK ARTHROSCOPY KNEE (MISCELLANEOUS) ×3 IMPLANT
PADDING CAST 4IN STRL (MISCELLANEOUS)
PADDING CAST BLEND 4X4 STRL (MISCELLANEOUS) IMPLANT
SET TUBE SUCT SHAVER OUTFL 24K (TUBING) ×3 IMPLANT
SET TUBE TIP INTRA-ARTICULAR (MISCELLANEOUS) IMPLANT
SOL PREP PVP 2OZ (MISCELLANEOUS) ×3
SOLUTION PREP PVP 2OZ (MISCELLANEOUS) ×1 IMPLANT
SUT ETH BLK MONO 3 0 FS 1 12/B (SUTURE) ×3 IMPLANT
SUT ETHILON 3-0 FS-10 30 BLK (SUTURE) ×3
SUTURE EHLN 3-0 FS-10 30 BLK (SUTURE) ×1 IMPLANT
TAPE MICROFOAM 4IN (TAPE) ×3 IMPLANT
TUBING ARTHRO INFLOW-ONLY STRL (TUBING) ×3 IMPLANT
WAND 30 DEG SABER W/CORD (SURGICAL WAND) ×3 IMPLANT
WAND ARTHRO PARAGON T2 (SURGICAL WAND) IMPLANT
WAND COBLATION FLOW 50 (SURGICAL WAND) ×3 IMPLANT
WAND COVAC 50 IFS (MISCELLANEOUS) IMPLANT
WAND HAND CNTRL MULTIVAC 50 (MISCELLANEOUS) IMPLANT
WAND HAND CNTRL MULTIVAC 90 (MISCELLANEOUS) IMPLANT
WRAP KNEE W/COLD PACKS 25.5X14 (SOFTGOODS) ×3 IMPLANT

## 2017-11-10 NOTE — Anesthesia Preprocedure Evaluation (Signed)
Anesthesia Evaluation  Patient identified by MRN, date of birth, ID band Patient awake    Reviewed: Allergy & Precautions, H&P , NPO status , reviewed documented beta blocker date and time   Airway Mallampati: III  TM Distance: >3 FB     Dental  (+) Edentulous Upper, Edentulous Lower   Pulmonary sleep apnea ,    Pulmonary exam normal        Cardiovascular hypertension, Normal cardiovascular exam     Neuro/Psych  Headaches, PSYCHIATRIC DISORDERS Anxiety Depression Dementia  Neuromuscular disease    GI/Hepatic GERD  Controlled,  Endo/Other  Hypothyroidism   Renal/GU Renal disease     Musculoskeletal   Abdominal   Peds  Hematology   Anesthesia Other Findings   Reproductive/Obstetrics                             Anesthesia Physical Anesthesia Plan  ASA: III  Anesthesia Plan: General LMA   Post-op Pain Management:    Induction:   PONV Risk Score and Plan: 3 and Ondansetron, Midazolam and Dexamethasone  Airway Management Planned:   Additional Equipment:   Intra-op Plan:   Post-operative Plan:   Informed Consent: I have reviewed the patients History and Physical, chart, labs and discussed the procedure including the risks, benefits and alternatives for the proposed anesthesia with the patient or authorized representative who has indicated his/her understanding and acceptance.   Dental Advisory Given  Plan Discussed with: CRNA  Anesthesia Plan Comments:         Anesthesia Quick Evaluation

## 2017-11-10 NOTE — Transfer of Care (Signed)
Immediate Anesthesia Transfer of Care Note  Patient: Kristina Reed  Procedure(s) Performed: KNEE ARTHROSCOPY WITH MEDIAL MENISECTOMY&patella femoral debridement, & ablation (Left )  Patient Location: PACU  Anesthesia Type:General  Level of Consciousness: sedated  Airway & Oxygen Therapy: Patient Spontanous Breathing and Patient connected to face mask oxygen  Post-op Assessment: Report given to RN and Post -op Vital signs reviewed and stable  Post vital signs: Reviewed and stable  Last Vitals:  Vitals:   11/10/17 0810 11/10/17 1052  BP: 124/83 126/74  Pulse: 72 65  Resp: 18 14  Temp: 36.6 C 36.4 C  SpO2: 100%     Last Pain:  Vitals:   11/10/17 0810  TempSrc: Tympanic  PainSc: 10-Worst pain ever         Complications: No apparent anesthesia complications

## 2017-11-10 NOTE — OR Nursing (Signed)
Discussed discharge instructions with pt and uncle. Both voice understanding.

## 2017-11-10 NOTE — H&P (Signed)
  H and P reviewed. No changes. Uploaded at later date. 

## 2017-11-10 NOTE — Op Note (Signed)
Patient: Kristina Reed, Kristina Reed.  Preoperative diagnosis: Torn medial meniscus left knee  Postop diagnosis: Patellofemoral chondral lesions left knee  Operation: Arthroscopic patellofemoral chondroplasty  Surgeon: Vilinda Flake, MD  Anesthesia: Gen.   History: Patient's had a long history of left knee pain.  The plain films revealed very minimal narrowing of the medial compartment.  The patient had an MRI which revealed torn medial meniscus.The patient was scheduled for surgery due to persistent discomfort despite conservative treatment.  The patient was taken the operating room where satisfactory general anesthesia was achieved. A tourniquet and leg holder were was applied to the left thigh. A well leg support was applied to the nonoperative extremity. The left knee was prepped and draped in usual fashion for an arthroscopic procedure. An inflow cannula was introduced superomedially. The joint was distended with lactated Ringer's. Scope was introduced through an inferolateral puncture wound and a probe through an inferomedial puncture wound. Inspection of the medial compartment revealed some fraying of the posterior and middle thirds of the medial meniscus.  I thought the meniscal width in these areas was decreased.  I went ahead and debrided the frayed posterior and middle thirds of the medial meniscus.  I used a motorized resector and a turbo whisker to perform the procedure.  There was a little fraying of the lateral aspect of the middle third of the medial femoral condyle.  This area was debrided with a turbo whisker.  Inspection of the intercondylar notch revealed intact cruciates. Inspection of the the lateral compartment revealed no significant meniscal or chondral pathology.   Trochlear groove was inspected revealing grade 3 chondral pathology.  Inspection of the retropatellar surface also revealed a small area of grade 2-3 chondral pathology.  This was located in the apex of the retropatellar  surface. The patella seemed to track fairly well.  I went ahead and performed a chondroplasty of the chondral lesions using an angled ArthroCare wand.  The instruments were removed from the joint at this time. The puncture wounds were closed with 3-0 nylon in vertical mattress fashion. I injected each puncture wound with several cc of half percent Marcaine without epinephrine. Betadine was applied to the wounds followed by sterile dressing. An ice pack was applied to the left knee. The patient was awakened and transferred to the stretcher bed. The patient was taken to the recovery room in satisfactory condition.  The tourniquet was not inflated during the course of the procedure. Blood loss was negligible.

## 2017-11-10 NOTE — Anesthesia Postprocedure Evaluation (Signed)
Anesthesia Post Note  Patient: Kristina Reed  Procedure(s) Performed: KNEE ARTHROSCOPY WITH PARTIAL MEDIAL MENISECTOMY&patella femoral debridement, & ablation (Left )  Patient location during evaluation: PACU Anesthesia Type: General Level of consciousness: awake and alert Pain management: pain level controlled Vital Signs Assessment: post-procedure vital signs reviewed and stable Respiratory status: spontaneous breathing, nonlabored ventilation and respiratory function stable Cardiovascular status: blood pressure returned to baseline and stable Postop Assessment: no apparent nausea or vomiting Anesthetic complications: no     Last Vitals:  Vitals:   11/10/17 1140 11/10/17 1152  BP: 114/81 117/70  Pulse: 68 65  Resp: 12 12  Temp:  36.6 C  SpO2: 100% 100%    Last Pain:  Vitals:   11/10/17 1152  TempSrc:   PainSc: 3                  Kaniel Kiang Harvie Heck

## 2017-11-10 NOTE — Discharge Instructions (Signed)
[facility]  Kathrene Alu., M.D. [phone]   KNEE ARTHROSCOPY POST OPERATION INSTRUCTIONS:  PLEASE READ THESE INSTRUCTIONS ABOUT POST OPERATION CARE. THEY WILL ANSWER MOST OF YOUR QUESTIONS.  You have been given a prescription for pain. Please take as directed for pain.  You can walk, keeping the knee slightly stiff-avoid doing too much bending the first day. (if ACL reconstruction is performed, keep brace locked in extension when walking.)  You will use crutches or cane if needed. Can weight bear as tolerated  Plan to take three to four days off from work. You can resume work when you are comfortable. (This can be a week or more, depending on the type of work you do.)  To reduce pain and swelling, place one to two pillows under the knee the first two or three days when sitting or lying. An ice pack may be placed on top of the area over the dressing. Instructions for making homemade icepack are as follow:  Flexible homemade alcohol water ice pack  2 cups water  1 cup rubbing alcohol  food coloring for the blue tint (optional)  2 zip-top bags - gallon-size  Mix the water and alcohol together in one of your zip-top bags and add food coloring. Release as much air as possible and seal the bag. Place in freezer for at least 12 hours.  The small incisions in your knee are closed with nylon stitches. They will be removed in the office.  The bulky dressing may be removed in the third day after surgery. (If ACL surgery-DO NOT REMOVE BANDAGES). Put a waterproof band-aid over each stitch. Do not put any creams or ointments on wounds. You may shower at this time, but change waterproof band-aids after showering. KEEP INCISIONS CLEAN AND DRY UNTIL YOU RETURN TO THE OFFICE.  Sometimes the operative area remains somewhat painful and swollen for several weeks. This is usually nothing to worry about, but call if you have any excessive symptoms, especially  fever. It is not unusual to have a low grade fever of 99 degrees for the first few days. If persist after 3-4 days call the office. It is not uncommon for the pain to be a little worse on the third day after surgery.  Begin doing gentle exercises right away. They will be limited by the amount of pain and swelling you have.  Exercising will reduce the swelling, increase motion, and prevent muscle weakness. Exercises: Straight leg raising and gentle knee bending.  Take a 325 milligram aspirin or Bufferin tablet twice a day for 2 weeks after meals or milk. This along with elevation will help reduce the possibility of phlebitis in your operated leg.  Avoid strenuous athletics for a minimum of 4 to 6 weeks after arthroscopic surgery (approximately five months if ACL surgery).  If the surgery included ACL reconstruction the brace that is supplied to the extremity post surgery is to be locked in extension when you are asleep and is to be locked in extension when you are ambulating. It can be unlocked for exercises or sitting.  Keep your post surgery appointment that has been made for you. If you do not remember the date call [phone]. Your follow up appointment should be between 7-10 days.   AMBULATORY SURGERY  DISCHARGE INSTRUCTIONS   1) The drugs that you were given will stay in your system until tomorrow so for the next 24 hours you should not:  A) Drive an automobile B)  Make any legal decisions C) Drink any alcoholic beverage   2) You may resume regular meals tomorrow.  Today it is better to start with liquids and gradually work up to solid foods.  You may eat anything you prefer, but it is better to start with liquids, then soup and crackers, and gradually work up to solid foods.   3) Please notify your doctor immediately if you have any unusual bleeding, trouble breathing, redness and pain at the surgery site, drainage, fever, or pain not relieved by medication.    4) Additional  Instructions:        Please contact your physician with any problems or Same Day Surgery at [phone], Monday through Friday 6 am to 4 pm, or [place] [facility] number at [phone].

## 2017-11-10 NOTE — Anesthesia Post-op Follow-up Note (Signed)
Anesthesia QCDR form completed.        

## 2017-11-10 NOTE — Anesthesia Procedure Notes (Signed)
Procedure Name: LMA Insertion Date/Time: 11/10/2017 9:32 AM Performed by: Johnna Acosta, CRNA Pre-anesthesia Checklist: Patient identified, Emergency Drugs available, Suction available, Patient being monitored and Timeout performed Patient Re-evaluated:Patient Re-evaluated prior to induction Oxygen Delivery Method: Circle system utilized Preoxygenation: Pre-oxygenation with 100% oxygen Induction Type: IV induction LMA: LMA inserted LMA Size: 4.0 Tube type: Oral Number of attempts: 1 Placement Confirmation: positive ETCO2 and breath sounds checked- equal and bilateral Tube secured with: Tape Dental Injury: Teeth and Oropharynx as per pre-operative assessment

## 2017-11-11 ENCOUNTER — Encounter: Payer: Self-pay | Admitting: Unknown Physician Specialty

## 2017-11-15 ENCOUNTER — Ambulatory Visit: Payer: Medicaid Other | Admitting: Gastroenterology

## 2017-12-02 ENCOUNTER — Encounter: Payer: Self-pay | Admitting: Gastroenterology

## 2017-12-02 ENCOUNTER — Other Ambulatory Visit
Admission: RE | Admit: 2017-12-02 | Discharge: 2017-12-02 | Disposition: A | Discharge status: Home / Self Care | Payer: Medicaid Other | Source: Ambulatory Visit | Attending: Gastroenterology | Admitting: Gastroenterology

## 2017-12-02 ENCOUNTER — Ambulatory Visit (INDEPENDENT_AMBULATORY_CARE_PROVIDER_SITE_OTHER): Payer: Medicaid Other | Admitting: Gastroenterology

## 2017-12-02 ENCOUNTER — Telehealth: Payer: Self-pay | Admitting: Gastroenterology

## 2017-12-02 VITALS — BP 125/81 | HR 83 | Ht 61.75 in | Wt 166.0 lb

## 2017-12-02 DIAGNOSIS — K625 Hemorrhage of anus and rectum: Secondary | ICD-10-CM

## 2017-12-02 LAB — CBC WITH DIFFERENTIAL/PLATELET
Basophils Absolute: 0 10*3/uL (ref 0–0.1)
Basophils Relative: 1 %
EOS ABS: 0.4 10*3/uL (ref 0–0.7)
Eosinophils Relative: 6 %
HCT: 38.1 % (ref 35.0–47.0)
Hemoglobin: 12.8 g/dL (ref 12.0–16.0)
LYMPHS ABS: 2.3 10*3/uL (ref 1.0–3.6)
LYMPHS PCT: 34 %
MCH: 28.7 pg (ref 26.0–34.0)
MCHC: 33.6 g/dL (ref 32.0–36.0)
MCV: 85.4 fL (ref 80.0–100.0)
Monocytes Absolute: 0.5 10*3/uL (ref 0.2–0.9)
Monocytes Relative: 8 %
NEUTROS ABS: 3.6 10*3/uL (ref 1.4–6.5)
Neutrophils Relative %: 53 %
Platelets: 211 10*3/uL (ref 150–440)
RBC: 4.46 MIL/uL (ref 3.80–5.20)
RDW: 13.4 % (ref 11.5–14.5)
WBC: 7 10*3/uL (ref 3.6–11.0)

## 2017-12-02 NOTE — Progress Notes (Signed)
Kristina Bellows MD, MRCP(U.K) [place]  [place], [place] [address]  Main: [phone]  Fax: [phone]   Primary Care Physician: Kristina Courser, MD  Primary Gastroenterologist:  Dr. Jonathon Reed   No chief complaint on file.   HPI: Kristina Reed is a 52 y.o. female    Summary of history  She used to be a patient of [facility] and was last seen in 08/2017 . She had been seen for nausea and vomiting after eating greasy foods, rectal bleeding, dysphagia. Was not on a PPi at that time.  Barium swallow which showed moderate GERD. Was commenced on omeprazole 20 mg once a day .  EGD: 02/15/14 - reflux esophagitis.Last colonoscopy was in 06/2015 and was found to have diverticulosis and internal hemorrhoids.  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 She eats steak , all other meat . Meat more likely gets stuck. Bread goes down well.  EGD 07/22/17 - showed normal esophagus with a tongue of salmon colored mucosa--biopsies showed 40 eosinophils phpf .No barrettes esophagus.   Interval history 09/02/2017-11/2017   10/2017- colonoscopy normal - diverticulosis and hemorrhoids.   Swallowing is better, still did not get new teeth. Took inhalers for 8 weeks and stopped.   Occasionally has blood on the toilet paper and in the bowel , not associated with bowel movements Bright red blood. Fills up the toilet water. She has had hemorrhoids treatment x3 in the past . Occurs once in a while - once a month .    Current Outpatient Medications  Medication Sig Dispense Refill  . FLUoxetine (PROZAC) 20 MG tablet Take 1 tablet (20 mg total) by mouth daily. 30 tablet 5  . fluticasone (FLOVENT HFA) 220 MCG/ACT inhaler Take 2 puffs and swallow twice daily for 8 weeks. **Rinse mouth out after each use** (Patient taking differently: Inhale 2 puffs into the lungs 2 (two) times daily. Take 2 puffs and swallow twice daily for 8 weeks. **Rinse mouth out after each use**) 1 Inhaler 11  .  levothyroxine (SYNTHROID, LEVOTHROID) 75 MCG tablet Take 1 tablet (75 mcg total) by mouth daily. 30 tablet 1   No current facility-administered medications for this visit.     Allergies as of 12/02/2017 - Review Complete 11/10/2017  Allergen Reaction Noted  . Percocet [oxycodone-acetaminophen] Palpitations 01/20/2015    ROS:  General: Negative for anorexia, weight loss, fever, chills, fatigue, weakness. ENT: Negative for hoarseness, difficulty swallowing , nasal congestion. CV: Negative for chest pain, angina, palpitations, dyspnea on exertion, peripheral edema.  Respiratory: Negative for dyspnea at rest, dyspnea on exertion, cough, sputum, wheezing.  GI: See history of present illness. GU:  Negative for dysuria, hematuria, urinary incontinence, urinary frequency, nocturnal urination.  Endo: Negative for unusual weight change.    Physical Examination:   LMP 05/09/1993 (Approximate)   General: Well-nourished, well-developed in no acute distress.  Eyes: No icterus. Conjunctivae pink. Mouth: Oropharyngeal mucosa moist and pink , no lesions erythema or exudate. Lungs: Clear to auscultation bilaterally. Non-labored. Heart: Regular rate and rhythm, no murmurs rubs or gallops.  Abdomen: Bowel sounds are normal, nontender, nondistended, no hepatosplenomegaly or masses, no abdominal bruits or hernia , no rebound or guarding.   Extremities: No lower extremity edema. No clubbing or deformities. Neuro: Alert and oriented x 3.  Grossly intact. Skin: Warm and dry, no jaundice.   Psych: Alert and cooperative, normal mood and affect.   Imaging Studies: No results found.  Assessment and Plan:  Kristina Reed is a 52 y.o. y/o female here to follow up for eosinophilic esophagitis which she completed her inhaler steroid and is doing well . She has occasional blood on the toilet paper and toilet bowel . H/o treatment of her hemoroids in the past.    1. Refer to Dr Marius Ditch for banding of  hemorrhoids  2.  CBC today  3. Obtain dentures   Dr Kristina Bellows  MD,MRCP [facility]) Follow up in 8-12 weeks

## 2017-12-02 NOTE — Telephone Encounter (Signed)
Pt left vm for Dr. Chase Caller nurse to call her

## 2017-12-02 NOTE — Progress Notes (Signed)
Patient states that there was blood in the toilet when she went to urinate.  - no stool present

## 2017-12-03 ENCOUNTER — Telehealth: Payer: Self-pay

## 2017-12-03 DIAGNOSIS — Z1231 Encounter for screening mammogram for malignant neoplasm of breast: Secondary | ICD-10-CM

## 2017-12-03 NOTE — Telephone Encounter (Signed)
Copied from [place] [phone]. Topic: Referral - Request >> Dec 03, 2017 11:21 AM Oliver Pila B wrote: Reason for CRM: pt wants a referral to be sent to [facility], call pt to advise  Order has been sent to [facility] if patient has not been called by Wednesday, please let me know.

## 2017-12-03 NOTE — Telephone Encounter (Signed)
Last mammo 01/04/17 Verify if any problems or if just for screening Okay to enter orders Thank you

## 2017-12-03 NOTE — Telephone Encounter (Signed)
Called pt no answer. Placed referral for mammo, please let me know if there is anything else I need to do. Thanks

## 2017-12-05 ENCOUNTER — Encounter: Payer: Self-pay | Admitting: Gastroenterology

## 2017-12-08 ENCOUNTER — Ambulatory Visit: Payer: Self-pay

## 2017-12-08 ENCOUNTER — Emergency Department
Admission: EM | Admit: 2017-12-08 | Discharge: 2017-12-08 | Disposition: A | Discharge status: Home / Self Care | Payer: Medicaid Other | Attending: Emergency Medicine | Admitting: Emergency Medicine

## 2017-12-08 ENCOUNTER — Emergency Department: Payer: Medicaid Other

## 2017-12-08 ENCOUNTER — Ambulatory Visit: Payer: Self-pay | Admitting: *Deleted

## 2017-12-08 ENCOUNTER — Encounter: Payer: Self-pay | Admitting: Emergency Medicine

## 2017-12-08 ENCOUNTER — Other Ambulatory Visit: Payer: Self-pay

## 2017-12-08 DIAGNOSIS — I10 Essential (primary) hypertension: Secondary | ICD-10-CM | POA: Diagnosis not present

## 2017-12-08 DIAGNOSIS — E039 Hypothyroidism, unspecified: Secondary | ICD-10-CM | POA: Insufficient documentation

## 2017-12-08 DIAGNOSIS — R51 Headache: Secondary | ICD-10-CM | POA: Diagnosis not present

## 2017-12-08 DIAGNOSIS — W1840XA Slipping, tripping and stumbling without falling, unspecified, initial encounter: Secondary | ICD-10-CM | POA: Diagnosis not present

## 2017-12-08 DIAGNOSIS — H1012 Acute atopic conjunctivitis, left eye: Secondary | ICD-10-CM | POA: Insufficient documentation

## 2017-12-08 DIAGNOSIS — Y939 Activity, unspecified: Secondary | ICD-10-CM | POA: Diagnosis not present

## 2017-12-08 DIAGNOSIS — S8002XA Contusion of left knee, initial encounter: Secondary | ICD-10-CM | POA: Diagnosis not present

## 2017-12-08 DIAGNOSIS — S8992XA Unspecified injury of left lower leg, initial encounter: Secondary | ICD-10-CM | POA: Diagnosis present

## 2017-12-08 DIAGNOSIS — Y92019 Unspecified place in single-family (private) house as the place of occurrence of the external cause: Secondary | ICD-10-CM | POA: Diagnosis not present

## 2017-12-08 DIAGNOSIS — Y999 Unspecified external cause status: Secondary | ICD-10-CM | POA: Diagnosis not present

## 2017-12-08 DIAGNOSIS — Z79899 Other long term (current) drug therapy: Secondary | ICD-10-CM | POA: Diagnosis not present

## 2017-12-08 DIAGNOSIS — Z8541 Personal history of malignant neoplasm of cervix uteri: Secondary | ICD-10-CM | POA: Insufficient documentation

## 2017-12-08 MED ORDER — TETRACAINE HCL 0.5 % OP SOLN
1.0000 [drp] | Freq: Once | OPHTHALMIC | Status: AC
  Administered 2017-12-08: 1 [drp] via OPHTHALMIC
  Filled 2017-12-08: qty 4

## 2017-12-08 MED ORDER — FLUORESCEIN SODIUM 1 MG OP STRP
1.0000 | ORAL_STRIP | Freq: Once | OPHTHALMIC | Status: AC
  Administered 2017-12-08: 1 via OPHTHALMIC
  Filled 2017-12-08: qty 1

## 2017-12-08 MED ORDER — OLOPATADINE HCL 0.2 % OP SOLN: 1.0000 [drp] | Freq: Once | OPHTHALMIC | 0 refills | Status: AC

## 2017-12-08 NOTE — ED Notes (Signed)
Pt c/o LFT eye pain, swelling noted.

## 2017-12-08 NOTE — Telephone Encounter (Signed)
Pt. called to report she woke up today with left eye swollen shut.  Stated the eye was  itchy and painful yesterday, but the swelling just started this morning. Reported feeling numbness in the left eye.  Denied numbness in the lower portion of face; denied facial droop.  Upon further questioning, stated she fell this morning due to numbness in the left leg.  Denied weakness in the left arm or left leg. Speech clear.  Reported she feels like she has a fever.  C/o feeling hot and cold.  C/o dizziness and headache this AM.  Due to symptoms, advised to go to the ER.  Pt. Verb. understanding, and agreed.        Reason for Disposition . [1] SEVERE eyelid swelling (i.e., shut or almost) AND [2] fever . [1] Numbness (i.e., loss of sensation) of the face, arm / hand, or leg / foot on one side of the body AND [2] sudden onset AND [3] brief (now gone)  Answer Assessment - Initial Assessment Questions 1. SYMPTOM: "What is the main symptom you are concerned about?" (e.g., weakness, numbness)     Left eye shut  2. ONSET: "When did this start?" (minutes, hours, days; while sleeping)     This morning 3. LAST NORMAL: "When was the last time you were normal (no symptoms)?"    yesterday 4. PATTERN "Does this come and go, or has it been constant since it started?"  "Is it present now?"     Continuous 5. CARDIAC SYMPTOMS: "Have you had any of the following symptoms: chest pain, difficulty breathing, palpitations?"     no 6. NEUROLOGIC SYMPTOMS: "Have you had any of the following symptoms: headache, dizziness, vision loss, double vision, changes in speech, unsteady on your feet?"     C/o dizziness, headache, numbness in left leg, and fall this morning 7. OTHER SYMPTOMS: "Do you have any other symptoms?"     Left eye swollen and watering 8. PREGNANCY: "Is there any chance you are pregnant?" "When was your last menstrual period?"     No ; hysterectomy  Answer Assessment - Initial Assessment Questions 1. ONSET:  "When did the swelling start?" (e.g., minutes, hours, days)     This morning when awakening 2. 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 SEVERITY: "How swollen is it?"    Unsure 4. ITCHING: "Is there any itching?" If so, ask: "How much?"   (Scale 1-10; mild, moderate or severe)   C/o itching   5. PAIN: "Is the swelling painful to touch?" If so, ask: "How painful is it?"   (Scale 1-10; mild, moderate or severe)    Pain in lateral corner of left eye 6. FEVER: "Do you have a fever?" If so, ask: "What is it, how was it measured, and when did it start?"      Feels feverish this morning ; c/o chills and also feeling warm 7. CAUSE: "What do you think is causing the swelling?"     unsure 8. RECURRENT SYMPTOM: "Have you had eyelid swelling before?" If so, ask: "When was the last time?" "What happened that time?"     No previous occurance 9. OTHER SYMPTOMS: "Do you have any other symptoms?" (e.g., blurred vision, eye discharge, rash, runny nose)     Left eye watering frequently; fell this morning; reported left leg numbness;  Denied left arm numbness or extremity weakness  10. PREGNANCY: "Is there any chance you are pregnant?" "When was your last menstrual period?"       No; hysterectomy  Protocols used: EYE - SWELLING-A-AH, NEUROLOGIC DEFICIT-A-AH

## 2017-12-08 NOTE — Telephone Encounter (Signed)
For information only.

## 2017-12-08 NOTE — ED Triage Notes (Signed)
Pt to ED via EMS from home with c/o mechanical fall this am, c/o LFT leg pain. Pt A&OX4

## 2017-12-08 NOTE — Discharge Instructions (Addendum)
Call your primary care doctor and make an appointment for follow-up next week.  Apply ice to your knee as needed for swelling.  Wear Ace wrap to your knee for support for 1-2 days.  Do not wear it longer than that.  Also begin using your walker instead of your cane as this will give you better support and help with your balance. Use eyedrops to your eye once a day as directed.

## 2017-12-08 NOTE — ED Provider Notes (Signed)
[facility] Emergency Department Provider Note  ____________________________________________   First MD Initiated Contact with Patient 12/08/17 1142     (approximate)  I have reviewed the triage vital signs and the nursing notes.   HISTORY  Chief Complaint Fall   HPI Kristina Reed is a 52 y.o. female is here complaint of left knee pain after mechanical fall today at home.  She is brought in via EMS with knee pain.  Patient denies any head injury or loss of consciousness during this event.  She states that her left eye was matted shut this morning and that she has had clear drainage from it.  This is made it very difficult for her to keep her eyelid open.  She currently walks with a cane. She rates her knee pain as a 10/10.  She denies any other injury.  Past Medical History:  Diagnosis Date  . Anxiety   . Arthritis    right knee and right elbow  . Cancer ([place])    Cervical CA with partial hysterectomy.  . Cognitive impairment, mild, so stated   . Depression   . Diverticulosis   . Eosinophilic esophagitis 36/14/4315   Biopsy Dec 2018  . Gallstones   . GERD (gastroesophageal reflux disease)   . Hx of cervical cancer 01/30/2016  . Hypertension   . Hypothyroidism   . Sleep apnea    does not use a C-PAP  . Status post partial hysterectomy    Due to Cervical CA  . Vaginal inclusion cyst     Patient Active Problem List   Diagnosis Date Noted  . Preventative health care 09/03/2017  . Eosinophilic esophagitis 40/04/6760  . Apophysitis 08/26/2017  . 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 Joint pain 07/05/2017  . Osteoarthritis of knee 07/05/2017  . Lichen sclerosus et atrophicus 04/27/2017  . Headache 04/27/2017  . Pharyngoesophageal dysphagia 03/24/2017  . Headache disorder 02/17/2017  . Post-concussion headache 02/17/2017  . Medication monitoring encounter 09/21/2016  . Hematuria 04/23/2016  . Hx of cervical cancer 01/30/2016  . Menopause  01/30/2016  . Status post bilateral breast biopsy 01/07/2016  . Chronic nausea 10/25/2015  . Screening for STD (sexually transmitted disease) 10/25/2015  . External hemorrhoid 05/03/2015  . Rectal bleeding 04/30/2015  . Right knee pain 07/23/2014  . Depression, major, recurrent, moderate ([place]) 02/02/2014  . Gastroesophagitis 02/02/2014  . Adult hypothyroidism 02/02/2014  . Obstructive apnea 02/02/2014  . Incomplete bladder emptying 12/12/2012  . Tenosynovitis of foot 05/30/2012  . Benign neoplasm of kidney 05/13/2012  . Urge incontinence 05/13/2012  . FOM (frequency of micturition) 05/13/2012  . Chronic pain of right hand 05/05/2012  . Tarsal tunnel syndrome 03/03/2012    Past Surgical History:  Procedure Laterality Date  . ABDOMINAL HYSTERECTOMY     partial hysterectomy  . BREAST BIOPSY Bilateral 01/01/2016   [name] cores. Fibroadenomas  . COLONOSCOPY WITH PROPOFOL N/A 06/27/2015   Procedure: COLONOSCOPY WITH PROPOFOL;  Surgeon: Josefine Class, MD;  Location: [facility] ENDOSCOPY;  Service: Endoscopy;  Laterality: N/A;  . COLONOSCOPY WITH PROPOFOL N/A 09/23/2017   Procedure: COLONOSCOPY WITH PROPOFOL;  Surgeon: Jonathon Bellows, MD;  Location: [facility] ENDOSCOPY;  Service: Gastroenterology;  Laterality: N/A;  . ESOPHAGOGASTRODUODENOSCOPY (EGD) WITH PROPOFOL N/A 07/22/2017   Procedure: ESOPHAGOGASTRODUODENOSCOPY (EGD) WITH PROPOFOL;  Surgeon: Jonathon Bellows, MD;  Location: [facility] ENDOSCOPY;  Service: Gastroenterology;  Laterality: N/A;  . KNEE ARTHROSCOPY Right 07/22/2016   Procedure: ARTHROSCOPY KNEE debridement microfracture;  Surgeon: Leanor Kail, MD;  Location: [facility]  ORS;  Service: Orthopedics;  Laterality: Right;  . KNEE ARTHROSCOPY WITH MEDIAL MENISECTOMY Left 11/10/2017   Procedure: KNEE ARTHROSCOPY WITH PARTIAL MEDIAL MENISECTOMY&patella femoral debridement, & ablation;  Surgeon: Leanor Kail, MD;  Location: [facility] ORS;  Service: Orthopedics;  Laterality: Left;  . TUBAL LIGATION       Prior to Admission medications   Medication Sig Start Date End Date Taking? Authorizing Provider  FLUoxetine (PROZAC) 20 MG tablet Take 1 tablet (20 mg total) by mouth daily. 09/03/17   [name], MD  fluticasone (FLOVENT HFA) 220 MCG/ACT inhaler Take 2 puffs and swallow twice daily for 8 weeks. **Rinse mouth out after each use** Patient taking differently: Inhale 2 puffs into the lungs 2 (two) times daily. Take 2 puffs and swallow twice daily for 8 weeks. **Rinse mouth out after each use** 09/02/17   Jonathon Bellows, MD  levothyroxine (SYNTHROID, LEVOTHROID) 75 MCG tablet Take 1 tablet (75 mcg total) by mouth daily. 10/22/17   [name], MD  Olopatadine HCl (PATADAY) 0.2 % SOLN Apply 1 drop to eye once for 1 dose. 12/08/17 12/08/17  Johnn Hai, PA-C    Allergies Percocet [oxycodone-acetaminophen]  Family History  Problem Relation Age of Onset  . Cancer Father        unknown  . Diabetes Mother   . Breast cancer Maternal Aunt   . Cancer Maternal Grandmother        cancer?  . Cancer Paternal Grandmother        cancer?  . Cancer Paternal Grandfather        unknown    Social History Social History   Tobacco Use  . Smoking status: Never Smoker  . Smokeless tobacco: Never Used  Substance Use Topics  . Alcohol use: No  . Drug use: No    Review of Systems Constitutional: No fever/chills Eyes: Positive clear drainage left eye.  Positive lashes matting left eye. ENT: No trauma. Cardiovascular: Denies chest pain. Respiratory: Denies shortness of breath. Gastrointestinal: No abdominal pain.  No nausea, no vomiting.  Musculoskeletal: Positive for left knee pain. Skin: Negative for rash. Neurological: Negative for headaches, focal weakness or numbness. ____________________________________________   PHYSICAL EXAM:  VITAL SIGNS: ED Triage Vitals  Enc Vitals Group     BP 12/08/17 1123 125/82     Pulse Rate 12/08/17 1123 76     Resp 12/08/17 1123 16     Temp  12/08/17 1123 98.4 F (36.9 C)     Temp Source 12/08/17 1123 Oral     SpO2 12/08/17 1121 98 %     Weight 12/08/17 1123 166 lb (75.3 kg)     Height 12/08/17 1123 5\' 1"  (1.549 m)     Head Circumference --      Peak Flow --      Pain Score 12/08/17 1122 10     Pain Loc --      Pain Edu? --      Excl. in [place]? --    Constitutional: Alert and oriented. Well appearing and in no acute distress. Eyes: Conjunctivae are normal. PERRL. EOMI. 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  Left eyelid was inverted and no foreign body noted.  There is no injection but evidence of clear drainage that has dried.  No active draining noted.  Tetracaine was placed in the eye along with fluorescein dye.  No corneal abrasions were noted.  Head: Atraumatic. Nose: No congestion/rhinnorhea. Mouth/Throat: Mucous membranes are moist.  Oropharynx non-erythematous. Neck: No stridor.   Cardiovascular: Normal rate, regular rhythm. Grossly normal heart sounds.  Good peripheral circulation. Respiratory: Normal respiratory effort.  No retractions. Lungs CTAB. Gastrointestinal: Soft and nontender. No distention. Musculoskeletal: No cervical spine tenderness is noted.  There is no tenderness on palpation of the thoracic or lumbar spine.  There is tenderness on palpation of the left anterior knee without evidence of effusion.  Range of motion is restricted secondary to patient's discomfort.  No abrasions or ecchymosis is present.  Nontender pelvis and left hip to palpation.  There is also no tenderness or swelling present left lower extremity. Neurologic:  Normal speech and language. No gross focal neurologic deficits are appreciated.  Skin:  Skin is warm, dry and intact. No rash noted. Psychiatric: Mood and affect are normal. Speech and behavior are normal.  ____________________________________________   LABS (all labs ordered are listed, but only abnormal results are displayed)  Labs Reviewed - No data to  display  RADIOLOGY  ED MD interpretation:   Left knee x-ray is negative for fracture.  Official radiology report(s): Ct Head Wo Contrast  Result Date: 12/08/2017 CLINICAL DATA:  Mechanical fall with left leg pain. Frontal pain. Initial encounter. EXAM: CT HEAD WITHOUT CONTRAST TECHNIQUE: Contiguous axial images were obtained from the base of the skull through the vertex without intravenous contrast. COMPARISON:  01/12/2017 FINDINGS: Brain: No evidence of acute infarction, hemorrhage, hydrocephalus, extra-axial collection or mass lesion/mass effect. Small posterior fossa with cerebellar tonsils descent to the level of the C1 posterior ring, at least 9 mm. Vascular: No hyperdense vessel or unexpected calcification. Skull: Negative for fracture or focal lesion. Hypoplastic appearance of the clivus tip and occipital condyles. Sinuses/Orbits: Negative IMPRESSION: 1. No acute finding. 2. Chiari 1 malformation.  Is there history of occipital headaches? Electronically Signed   By: Monte Fantasia M.D.   On: 12/08/2017 12:14   Dg Knee Complete 4 Views Left  Result Date: 12/08/2017 CLINICAL DATA:  Left anterior knee pain after falling today. EXAM: LEFT KNEE - COMPLETE 4+ VIEW COMPARISON:  08/18/2017 FINDINGS: Prepatellar soft tissue swelling not seen previously. No evidence of fracture or dislocation. No joint effusion. Calcification within the distal patellar tendon/tibial tubercle region is unchanged. IMPRESSION: Prepatellar soft tissue swelling. Otherwise no change and no acute finding. Electronically Signed   By: Nelson Chimes M.D.   On: 12/08/2017 13:28    ____________________________________________   PROCEDURES  Procedure(s) performed: None  Procedures  Critical Care performed: No  ____________________________________________   INITIAL IMPRESSION / ASSESSMENT AND PLAN / ED COURSE Patient was given instructions to discontinue using her cane and at this time begin using her walker for extra  stability.  Patient is also to talk to Dr. Sanda Klein,  who is her PCP.  Patient was given a prescription for Pataday for her left eye.  She was reassured that there was no eye infection or corneal abrasion noted.  She is to apply ice to her left knee and take over-the-counter medication as needed for pain.  We discussed pain medication increasing her risk for falling again.  She agrees with this plan.   ____________________________________________   FINAL CLINICAL IMPRESSION(S) / ED DIAGNOSES  Final diagnoses:  Contusion of left knee, initial encounter  Allergic conjunctivitis of left eye     ED Discharge Orders        Ordered    Olopatadine HCl (PATADAY) 0.2 % SOLN   Once     12/08/17 1418  Note:  This document was prepared using Dragon voice recognition software and may include unintentional dictation errors.    Johnn Hai, PA-C 12/08/17 1525    Lisa Roca, MD 12/10/17 [id number]

## 2017-12-08 NOTE — Telephone Encounter (Signed)
This was put in the triage cue by mistake by Ivar Drape.   I'm clearing it out of the que.

## 2017-12-24 ENCOUNTER — Ambulatory Visit: Payer: Self-pay | Admitting: Gastroenterology

## 2018-01-03 ENCOUNTER — Encounter: Payer: Self-pay | Admitting: Gastroenterology

## 2018-01-03 ENCOUNTER — Other Ambulatory Visit: Payer: Self-pay

## 2018-01-03 ENCOUNTER — Ambulatory Visit (INDEPENDENT_AMBULATORY_CARE_PROVIDER_SITE_OTHER): Payer: Medicaid Other | Admitting: Gastroenterology

## 2018-01-03 VITALS — BP 138/98 | HR 80 | Wt 165.0 lb

## 2018-01-03 DIAGNOSIS — K640 First degree hemorrhoids: Secondary | ICD-10-CM | POA: Diagnosis not present

## 2018-01-03 DIAGNOSIS — K5909 Other constipation: Secondary | ICD-10-CM

## 2018-01-03 NOTE — Progress Notes (Signed)
Kristina Darby, MD [address]  [place]  [place], [place] [address]  Main: [phone]  Fax: [phone] Pager: [phone]   Primary Care Physician: Kristina Courser, MD  Primary Gastroenterologist:  Dr. Cephas Reed  Chief Complaint  Patient presents with  . Rectal Bleeding    hemorrhoids, constipation    HPI: Kristina Reed is a 52 y.o. female who is regularly sees Kristina Reed for eosinophilic esophagitis is referred to me for management of hemorrhoids. Patient reports that she underwent hemorrhoid banding by Dr Kristina Reed 3 years ago underwent 3 banding sessions and she did not think the procedure was helpful in alleviating her hemorrhoid symptoms. Her symptoms are predominantly itching, burning, swelling, pressure, rectal bleeding. She has history of chronic constipation and spends about 10-20 minutes time on toilet, due to significant straining/pushing. She consumes red meat, fried foods regularly. She has not tried any stool softener or fiber supplements.    Current Outpatient Medications  Medication Sig Dispense Refill  . FLUoxetine (PROZAC) 20 MG capsule Take 20 mg by mouth daily.  5  . fluticasone (FLOVENT HFA) 220 MCG/ACT inhaler Take 2 puffs and swallow twice daily for 8 weeks. **Rinse mouth out after each use** (Patient taking differently: Inhale 2 puffs into the lungs 2 (two) times daily. Take 2 puffs and swallow twice daily for 8 weeks. **Rinse mouth out after each use**) 1 Inhaler 11  . levothyroxine (SYNTHROID, LEVOTHROID) 75 MCG tablet Take 1 tablet (75 mcg total) by mouth daily. 30 tablet 1   No current facility-administered medications for this visit.     Allergies as of 01/03/2018 - Review Complete 01/03/2018  Allergen Reaction Noted  . Percocet [oxycodone-acetaminophen] Palpitations 01/20/2015    NSAIDs: none  Antiplts/Anticoagulants/Anti thrombotics: none  GI procedures:  Colonoscopy on 1019 by Kristina Reed - Non-bleeding internal hemorrhoids. -  Diverticulosis in the sigmoid colon. - The examination was otherwise normal on direct and retroflexion views. - No specimens collected.  ROS:  General: Negative for anorexia, weight loss, fever, chills, fatigue, weakness. ENT: Negative for hoarseness, difficulty swallowing , nasal congestion. CV: Negative for chest pain, angina, palpitations, dyspnea on exertion, peripheral edema.  Respiratory: Negative for dyspnea at rest, dyspnea on exertion, cough, sputum, wheezing.  GI: See history of present illness. GU:  Negative for dysuria, hematuria, urinary incontinence, urinary frequency, nocturnal urination.  Endo: Negative for unusual weight change.    Physical Examination:   BP (!) 138/98   Pulse 80   Wt 165 lb (74.8 kg)   LMP 05/09/1993 (Approximate)   BMI 31.18 kg/m   General: Well-nourished, well-developed in no acute distress.  Eyes: No icterus. Conjunctivae pink. Mouth: Oropharyngeal mucosa moist and pink , no lesions erythema or exudate. Lungs: Clear to auscultation bilaterally. Non-labored. Heart: Regular rate and rhythm, no murmurs rubs or gallops.  Abdomen: Bowel sounds are normal, nontender, nondistended, no hepatosplenomegaly or masses, no hernia , no rebound or guarding.   Extremities: No lower extremity edema. No clubbing or deformities. Neuro: Alert and oriented x 3.  Grossly intact. Skin: Warm and dry, no jaundice.   Psych: Alert and cooperative, normal mood and affect.   Imaging Studies: Ct Head Wo Contrast  Result Date: 12/08/2017 CLINICAL DATA:  Mechanical fall with left leg pain. Frontal pain. Initial encounter. EXAM: CT HEAD WITHOUT CONTRAST TECHNIQUE: Contiguous axial images were obtained from the base of the skull through the vertex without intravenous contrast. COMPARISON:  01/12/2017 FINDINGS: Brain: No evidence of  acute infarction, hemorrhage, hydrocephalus, extra-axial collection or mass lesion/mass effect. Small posterior fossa with cerebellar tonsils  descent to the level of the C1 posterior ring, at least 9 mm. Vascular: No hyperdense vessel or unexpected calcification. Skull: Negative for fracture or focal lesion. Hypoplastic appearance of the clivus tip and occipital condyles. Sinuses/Orbits: Negative IMPRESSION: 1. No acute finding. 2. Chiari 1 malformation.  Is there history of occipital headaches? Electronically Signed   By: Monte Fantasia M.D.   On: 12/08/2017 12:14   Dg Knee Complete 4 Views Left  Result Date: 12/08/2017 CLINICAL DATA:  Left anterior knee pain after falling today. EXAM: LEFT KNEE - COMPLETE 4+ VIEW COMPARISON:  08/18/2017 FINDINGS: Prepatellar soft tissue swelling not seen previously. No evidence of fracture or dislocation. No joint effusion. Calcification within the distal patellar tendon/tibial tubercle region is unchanged. IMPRESSION: Prepatellar soft tissue swelling. Otherwise no change and no acute finding. Electronically Signed   By: Nelson Chimes M.D.   On: 12/08/2017 13:28    Assessment and Plan:   Kristina Reed is a 52 y.o. female history of eosinophilic esophagitis on Flovent, in remission with chronic constipation and intermittent rectal bleeding referred to discuss about hemorrhoidal ligation. Prior history of banding about 3 years ago with modest benefit  Chronic constipation: - discussed with her about high-fiber diet, education material provided - Fiber supplements - Start Linaclotide 145 MCG daily - avoid red meat and greasy foods  Symptomatic hemorrhoids: - Patient would like to defer hemorrhoid ligation until her constipation is better controlled   Follow up in 2 weeks    Dr Sherri Sear, MD

## 2018-01-03 NOTE — Patient Instructions (Signed)
High-Fiber Diet  Fiber, also called dietary fiber, is a type of carbohydrate found in fruits, vegetables, whole grains, and beans. A high-fiber diet can have many health benefits. Your health care provider may recommend a high-fiber diet to help:  · Prevent constipation. Fiber can make your bowel movements more regular.  · Lower your cholesterol.  · Relieve hemorrhoids, uncomplicated diverticulosis, or irritable bowel syndrome.  · Prevent overeating as part of a weight-loss plan.  · Prevent heart disease, type 2 diabetes, and certain cancers.    What is my plan?  The recommended daily intake of fiber includes:  · 38 grams for men under age 50.  · 30 grams for men over age 50.  · 25 grams for women under age 50.  · 21 grams for women over age 50.    You can get the recommended daily intake of dietary fiber by eating a variety of fruits, vegetables, grains, and beans. Your health care provider may also recommend a fiber supplement if it is not possible to get enough fiber through your diet.  What do I need to know about a high-fiber diet?  · Fiber supplements have not been widely studied for their effectiveness, so it is better to get fiber through food sources.  · Always check the fiber content on the nutrition facts label of any prepackaged food. Look for foods that contain at least 5 grams of fiber per serving.  · Ask your dietitian if you have questions about specific foods that are related to your condition, especially if those foods are not listed in the following section.  · Increase your daily fiber consumption gradually. Increasing your intake of dietary fiber too quickly may cause bloating, cramping, or gas.  · Drink plenty of water. Water helps you to digest fiber.  What foods can I eat?  Grains  Whole-grain breads. Multigrain cereal. Oats and oatmeal. Brown rice. Barley. Bulgur wheat. Millet. Bran muffins. Popcorn. Rye wafer crackers.  Vegetables   Sweet potatoes. Spinach. Kale. Artichokes. Cabbage. Broccoli. Green peas. Carrots. Squash.  Fruits  Berries. Pears. Apples. Oranges. Avocados. Prunes and raisins. Dried figs.  Meats and Other Protein Sources  [place]y, kidney, pinto, and soy beans. Split peas. Lentils. Nuts and seeds.  Dairy  Fiber-fortified yogurt.  Beverages  Fiber-fortified soy milk. Fiber-fortified orange juice.  Other  Fiber bars.  The items listed above may not be a complete list of recommended foods or beverages. Contact your dietitian for more options.  What foods are not recommended?  Grains  White bread. Pasta made with refined flour. White rice.  Vegetables  Fried potatoes. Canned vegetables. Well-cooked vegetables.  Fruits  Fruit juice. Cooked, strained fruit.  Meats and Other Protein Sources  Fatty cuts of meat. Fried poultry or fried fish.  Dairy  Milk. Yogurt. Cream cheese. Sour cream.  Beverages  Soft drinks.  Other  Cakes and pastries. Butter and oils.  The items listed above may not be a complete list of foods and beverages to avoid. Contact your dietitian for more information.  What are some tips for including high-fiber foods in my diet?  · Eat a wide variety of high-fiber foods.  · Make sure that half of all grains consumed each day are whole grains.  · Replace breads and cereals made from refined flour or white flour with whole-grain breads and cereals.  · Replace white rice with brown rice, bulgur wheat, or millet.  · Start the day with a breakfast that is high in fiber,   such as a cereal that contains at least 5 grams of fiber per serving.  · Use beans in place of meat in soups, salads, or pasta.  · Eat high-fiber snacks, such as berries, raw vegetables, nuts, or popcorn.  This information is not intended to replace advice given to you by your health care provider. Make sure you discuss any questions you have with your health care provider.  Document Released: 08/31/2005 Document Revised: 02/06/2016 Document Reviewed: 02/13/2014   Elsevier Interactive Patient Education © 2018 [employer]

## 2018-01-04 ENCOUNTER — Telehealth: Payer: Self-pay | Admitting: Gastroenterology

## 2018-01-04 NOTE — Telephone Encounter (Signed)
Patient stated she was given mediation yesterday for constipation and went to the bathroom several times. When she wiped there was blood on the paper and some on her sheets. She is aware of her hemorroids. Please call patient

## 2018-01-04 NOTE — Telephone Encounter (Signed)
Dr. Marius Ditch patient stated that the linzess caused her to have diarrhea, upset stomach and there is bright red blood in stool-stool is yellow in color and this has been present all night long. She said she is already using Preparation H.

## 2018-01-19 ENCOUNTER — Ambulatory Visit: Payer: Medicaid Other | Admitting: Gastroenterology

## 2018-01-19 ENCOUNTER — Encounter: Payer: Self-pay | Admitting: Gastroenterology

## 2018-01-19 VITALS — BP 124/80 | Ht 61.0 in | Wt 166.8 lb

## 2018-01-19 DIAGNOSIS — K5904 Chronic idiopathic constipation: Secondary | ICD-10-CM

## 2018-01-19 DIAGNOSIS — K640 First degree hemorrhoids: Secondary | ICD-10-CM | POA: Diagnosis not present

## 2018-01-19 DIAGNOSIS — E669 Obesity, unspecified: Secondary | ICD-10-CM | POA: Insufficient documentation

## 2018-01-19 DIAGNOSIS — I10 Essential (primary) hypertension: Secondary | ICD-10-CM | POA: Insufficient documentation

## 2018-01-19 NOTE — Progress Notes (Signed)
Cephas Darby, MD [address]  [place]  [place], [place] [address]  Main: [phone]  Fax: [phone] Pager: [phone]   Primary Care Physician: [name], MD  Primary Gastroenterologist:  Dr. Cephas Darby  Chief Complaint  Patient presents with  . Follow-up    constipation and hemorrhoids    HPI: RAMAYA GUILE is a 52 y.o. female who is regularly sees Dr. Vicente Males for eosinophilic esophagitis is referred to me for management of hemorrhoids. Patient reports that she underwent hemorrhoid banding by Dr [name] 3 years ago underwent 3 banding sessions and she did not think the procedure was helpful in alleviating her hemorrhoid symptoms. Her symptoms are predominantly itching, burning, swelling, pressure, rectal bleeding. She has history of chronic constipation and spends about 10-20 minutes time on toilet, due to significant straining/pushing. She consumes red meat, fried foods regularly. She has not tried any stool softener or fiber supplements.  Follow-up visit 01/19/2018 She tried linaclotide 141mcg which resulted in severe diarrhea and mild rectal bleeding which resolved. She no longer experiences diarrhea. Reports having one to 2 soft bowel movements daily, but associated with pushing. She denies any symptoms from hemorrhoids at this time. She is taking fiber supplements as recommended daily.   Current Outpatient Medications  Medication Sig Dispense Refill  . FLUoxetine (PROZAC) 20 MG capsule Take 20 mg by mouth daily.  5  . fluticasone (FLOVENT HFA) 220 MCG/ACT inhaler Take 2 puffs and swallow twice daily for 8 weeks. **Rinse mouth out after each use** (Patient taking differently: Inhale 2 puffs into the lungs 2 (two) times daily. Take 2 puffs and swallow twice daily for 8 weeks. **Rinse mouth out after each use**) 1 Inhaler 11  . levothyroxine (SYNTHROID, LEVOTHROID) 75 MCG tablet Take 1 tablet (75 mcg total) by mouth daily. 30 tablet 1  . traZODone (DESYREL)  50 MG tablet Take by mouth.     No current facility-administered medications for this visit.     Allergies as of 01/19/2018 - Review Complete 01/19/2018  Allergen Reaction Noted  . Percocet [oxycodone-acetaminophen] Palpitations 01/20/2015    NSAIDs: none  Antiplts/Anticoagulants/Anti thrombotics: none  GI procedures:  Colonoscopy on 1019 by Dr. Vicente Males - Non-bleeding internal hemorrhoids. - Diverticulosis in the sigmoid colon. - The examination was otherwise normal on direct and retroflexion views. - No specimens collected.  ROS:  General: Negative for anorexia, weight loss, fever, chills, fatigue, weakness. ENT: Negative for hoarseness, difficulty swallowing , nasal congestion. CV: Negative for chest pain, angina, palpitations, dyspnea on exertion, peripheral edema.  Respiratory: Negative for dyspnea at rest, dyspnea on exertion, cough, sputum, wheezing.  GI: See history of present illness. GU:  Negative for dysuria, hematuria, urinary incontinence, urinary frequency, nocturnal urination.  Endo: Negative for unusual weight change.    Physical Examination:   BP 124/80   Ht 5\' 1"  (1.549 m)   Wt 166 lb 12.8 oz (75.7 kg)   LMP 05/09/1993 (Approximate)   BMI 31.52 kg/m   General: Well-nourished, well-developed in no acute distress.  Eyes: No icterus. Conjunctivae pink. Mouth: Oropharyngeal mucosa moist and pink , no lesions erythema or exudate. Lungs: Clear to auscultation bilaterally. Non-labored. Heart: Regular rate and rhythm, no murmurs rubs or gallops.  Abdomen: Bowel sounds are normal, nontender, nondistended, no hepatosplenomegaly or masses, no hernia , no rebound or guarding.   Extremities: No lower extremity edema. No clubbing or deformities. Neuro: Alert and oriented x 3.  Grossly intact. Skin: Warm and  dry, no jaundice.   Psych: Alert and cooperative, normal mood and affect.   Imaging Studies: No results found.  Assessment and Plan:   KORIANA STEPIEN is a 52  y.o. female history of eosinophilic esophagitis treated with Flovent, in remission with chronic constipation and intermittent rectal bleeding here for follow-up Prior history of banding about 3 years ago with modest benefit  Chronic constipation: improving - continue high-fiber diet - continue fiber supplements - continue to avoid red meat and greasy foods  Symptomatic hemorrhoids: - Perform outpatient hemorrhoidal ligation in 2 weeks   Follow up in 2 weeks    Dr Sherri Sear, MD

## 2018-02-03 ENCOUNTER — Telehealth: Payer: Self-pay | Admitting: Gastroenterology

## 2018-02-03 NOTE — Telephone Encounter (Signed)
Pt left vm she states she needs to find out about the paper she dropped off to be faxed to [place] DONATE BLOOD AT ON [address]??

## 2018-02-07 ENCOUNTER — Other Ambulatory Visit: Payer: Self-pay

## 2018-02-07 ENCOUNTER — Encounter: Payer: Self-pay | Admitting: Emergency Medicine

## 2018-02-07 ENCOUNTER — Emergency Department
Admission: EM | Admit: 2018-02-07 | Discharge: 2018-02-07 | Disposition: A | Discharge status: Home / Self Care | Payer: Medicaid Other | Attending: Emergency Medicine | Admitting: Emergency Medicine

## 2018-02-07 DIAGNOSIS — G8929 Other chronic pain: Secondary | ICD-10-CM | POA: Diagnosis not present

## 2018-02-07 DIAGNOSIS — E039 Hypothyroidism, unspecified: Secondary | ICD-10-CM | POA: Diagnosis not present

## 2018-02-07 DIAGNOSIS — Z8541 Personal history of malignant neoplasm of cervix uteri: Secondary | ICD-10-CM | POA: Insufficient documentation

## 2018-02-07 DIAGNOSIS — Z79899 Other long term (current) drug therapy: Secondary | ICD-10-CM | POA: Insufficient documentation

## 2018-02-07 DIAGNOSIS — I10 Essential (primary) hypertension: Secondary | ICD-10-CM | POA: Insufficient documentation

## 2018-02-07 DIAGNOSIS — M25562 Pain in left knee: Secondary | ICD-10-CM | POA: Diagnosis present

## 2018-02-07 MED ORDER — PREDNISONE 10 MG (21) PO TBPK: ORAL_TABLET | ORAL | 0 refills | Status: DC

## 2018-02-07 MED ORDER — KETOROLAC TROMETHAMINE 30 MG/ML IJ SOLN
30.0000 mg | Freq: Once | INTRAMUSCULAR | Status: AC
  Administered 2018-02-07: 30 mg via INTRAMUSCULAR
  Filled 2018-02-07: qty 1

## 2018-02-07 NOTE — Discharge Instructions (Addendum)
Follow up with your regular doctor if not better in 7 -10 days.  Apply ice to the knee

## 2018-02-07 NOTE — ED Notes (Signed)
Pt reports that she is having left knee - pt denies injury - c/o left knee swelling with pain off an on for 1 year - pt states that they advised her to have knee surgery "for deterioration" - she has been going to [facility] and receiving tx for the last year per fiancee

## 2018-02-07 NOTE — ED Provider Notes (Signed)
[facility] Emergency Department Provider Note  ____________________________________________   First MD Initiated Contact with Patient 02/07/18 1216     (approximate)  I have reviewed the triage vital signs and the nursing notes.   HISTORY  Chief Complaint Knee Pain    HPI Kristina Reed is a 52 y.o. female presents emergency department complaining of chronic left knee pain which is increased for last few days.  She denies any new injury.  She states she is supposed to have knee surgery but they keep putting this off.  She has a knee brace with her.  She states she has several knee wraps at home but they keep given her new ones.  She denies any new type inflammation.  She denies numbness or tingling  Past Medical History:  Diagnosis Date  . Anxiety   . Arthritis    right knee and right elbow  . Cancer ([place])    Cervical CA with partial hysterectomy.  . Cognitive impairment, mild, so stated   . Depression   . Diverticulosis   . Eosinophilic esophagitis 95/62/1308   Biopsy Dec 2018  . Gallstones   . GERD (gastroesophageal reflux disease)   . Hx of cervical cancer 01/30/2016  . Hypertension   . Hypothyroidism   . Sleep apnea    does not use a C-PAP  . Status post partial hysterectomy    Due to Cervical CA  . Vaginal inclusion cyst     Patient Active Problem List   Diagnosis Date Noted  . Essential hypertension 01/19/2018  . Obesity (BMI 30.0-34.9) 01/19/2018  . Preventative health care 09/03/2017  . Eosinophilic esophagitis 65/78/4696  . Apophysitis 08/26/2017  . 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 Pain in joint, multiple sites 07/05/2017  . Osteoarthritis of knee 07/05/2017  . Lichen sclerosus et atrophicus 04/27/2017  . Headache 04/27/2017  . Pharyngoesophageal dysphagia 03/24/2017  . Headache disorder 02/17/2017  . Post-concussion headache 02/17/2017  . Medication monitoring encounter 09/21/2016  . Hematuria 04/23/2016  . Hx of  cervical cancer 01/30/2016  . Menopause 01/30/2016  . Status post bilateral breast biopsy 01/07/2016  . Chronic nausea 10/25/2015  . Screening for STD (sexually transmitted disease) 10/25/2015  . External hemorrhoid 05/03/2015  . Rectal bleeding 04/30/2015  . Right knee pain 07/23/2014  . Depression, major, recurrent, moderate ([place]) 02/02/2014  . Gastroesophagitis 02/02/2014  . Adult hypothyroidism 02/02/2014  . Obstructive apnea 02/02/2014  . Anxiety and depression 02/02/2014  . Incomplete bladder emptying 12/12/2012  . Tenosynovitis of foot 05/30/2012  . Benign neoplasm of kidney 05/13/2012  . Urge incontinence 05/13/2012  . FOM (frequency of micturition) 05/13/2012  . Chronic pain of right hand 05/05/2012  . Tarsal tunnel syndrome 03/03/2012    Past Surgical History:  Procedure Laterality Date  . ABDOMINAL HYSTERECTOMY     partial hysterectomy  . BREAST BIOPSY Bilateral 01/01/2016   [name] cores. Fibroadenomas  . COLONOSCOPY WITH PROPOFOL N/A 06/27/2015   Procedure: COLONOSCOPY WITH PROPOFOL;  Surgeon: Josefine Class, MD;  Location: [facility] ENDOSCOPY;  Service: Endoscopy;  Laterality: N/A;  . COLONOSCOPY WITH PROPOFOL N/A 09/23/2017   Procedure: COLONOSCOPY WITH PROPOFOL;  Surgeon: Jonathon Bellows, MD;  Location: [facility] ENDOSCOPY;  Service: Gastroenterology;  Laterality: N/A;  . ESOPHAGOGASTRODUODENOSCOPY (EGD) WITH PROPOFOL N/A 07/22/2017   Procedure: ESOPHAGOGASTRODUODENOSCOPY (EGD) WITH PROPOFOL;  Surgeon: Jonathon Bellows, MD;  Location: [facility] ENDOSCOPY;  Service: Gastroenterology;  Laterality: N/A;  . KNEE ARTHROSCOPY Right 07/22/2016   Procedure: ARTHROSCOPY KNEE debridement microfracture;  Surgeon:  Leanor Kail, MD;  Location: [facility] ORS;  Service: Orthopedics;  Laterality: Right;  . KNEE ARTHROSCOPY WITH MEDIAL MENISECTOMY Left 11/10/2017   Procedure: KNEE ARTHROSCOPY WITH PARTIAL MEDIAL MENISECTOMY&patella femoral debridement, & ablation;  Surgeon: Leanor Kail, MD;  Location: [facility]  ORS;  Service: Orthopedics;  Laterality: Left;  . TUBAL LIGATION      Prior to Admission medications   Medication Sig Start Date End Date Taking? Authorizing Provider  FLUoxetine (PROZAC) 20 MG capsule Take 20 mg by mouth daily. 12/11/17   [provider]  levothyroxine (SYNTHROID, LEVOTHROID) 75 MCG tablet Take 1 tablet (75 mcg total) by mouth daily. 10/22/17   Lada, Satira Anis, MD  predniSONE (STERAPRED UNI-PAK 21 TAB) 10 MG (21) TBPK tablet Take 6 pills on day one then decrease by 1 pill each day 02/07/18   Versie Starks, PA-C  traZODone (DESYREL) 50 MG tablet Take by mouth.    [provider]    Allergies Percocet [oxycodone-acetaminophen]  Family History  Problem Relation Age of Onset  . Cancer Father        unknown  . Diabetes Mother   . Breast cancer Maternal Aunt   . Cancer Maternal Grandmother        cancer?  . Cancer Paternal Grandmother        cancer?  . Cancer Paternal Grandfather        unknown    Social History Social History   Tobacco Use  . Smoking status: Never Smoker  . Smokeless tobacco: Never Used  Substance Use Topics  . Alcohol use: No  . Drug use: No    Review of Systems  Constitutional: No fever/chills Eyes: No visual changes. ENT: No sore throat. Respiratory: Denies cough Genitourinary: Negative for dysuria. Musculoskeletal: Negative for back pain.  Positive for left knee pain Skin: Negative for rash.    ____________________________________________   PHYSICAL EXAM:  VITAL SIGNS: ED Triage Vitals [02/07/18 1144]  Enc Vitals Group     BP 115/79     Pulse Rate 65     Resp 20     Temp 98.1 F (36.7 C)     Temp Source Oral     SpO2 98 %     Weight 165 lb (74.8 kg)     Height 5' 1.5" (1.562 m)     Head Circumference      Peak Flow      Pain Score 10     Pain Loc      Pain Edu?      Excl. in [place]?     Constitutional: Alert and oriented. Well appearing and in no acute distress. Eyes: Conjunctivae are normal.    Head: Atraumatic. Nose: No congestion/rhinnorhea. Mouth/Throat: Mucous membranes are moist.   Cardiovascular: Normal rate, regular rhythm. Respiratory: Normal respiratory effort.  No retractions GU: deferred Musculoskeletal: Decreased range of motion of the left knee.  She does not have full extension of the leg.  When sitting on the stretcher.  She is tender posteriorly and anteriorly at the joint line.  She is neurovascularly intact. Neurologic:  Normal speech and language.  Skin:  Skin is warm, dry and intact. No rash noted. Psychiatric: Mood and affect are normal. Speech and behavior are normal.  ____________________________________________   LABS (all labs ordered are listed, but only abnormal results are displayed)  Labs Reviewed - No data to display ____________________________________________   ____________________________________________  RADIOLOGY    ____________________________________________   PROCEDURES  Procedure(s) performed: Toradol 30 mg  IM  Procedures    ____________________________________________   INITIAL IMPRESSION / ASSESSMENT AND PLAN / ED COURSE  Pertinent labs & imaging results that were available during my care of the patient were reviewed by me and considered in my medical decision making (see chart for details).  Patient is a 53 year old female presents emergency department complaining of left knee pain.  She states this is chronic and she has had no new injury.  She was advised to get knee surgery but they have put her off at [facility] clinic.  She states she has been taking over-the-counter medications without any relief.  They are basically here for pain relief.  She understands a new x-ray would not show any new findings.  On physical exam the left knee is tender posteriorly and anteriorly at the joint line.  There is no redness or swelling noted.  She has increased range of motion with full extension of the left knee.  She is  neurovascularly intact.  There are no other abnormalities noted on the exam.  The patient was given Toradol 30 mg IM.  She is given a prescription for steroid pack to decrease inflammation.  Follow-up with her regular doctor.  They state they understand will comply with our instructions.  She was discharged in stable condition     As part of my medical decision making, I reviewed the following data within the [place] notes reviewed and incorporated, Old chart reviewed, Notes from prior ED visits and  Controlled Substance Database  ____________________________________________   FINAL CLINICAL IMPRESSION(S) / ED DIAGNOSES  Final diagnoses:  Chronic pain of left knee      NEW MEDICATIONS STARTED DURING THIS VISIT:  Discharge Medication List as of 02/07/2018 12:26 PM    START taking these medications   Details  predniSONE (STERAPRED UNI-PAK 21 TAB) 10 MG (21) TBPK tablet Take 6 pills on day one then decrease by 1 pill each day, Print         Note:  This document was prepared using Dragon voice recognition software and may include unintentional dictation errors.    Versie Starks, PA-C 02/07/18 1516    Arta Silence, MD 02/07/18 [phone]

## 2018-02-07 NOTE — ED Triage Notes (Signed)
L knee pain x 2 days. History of problems with that knee. No new fall or injury.

## 2018-02-11 ENCOUNTER — Ambulatory Visit (INDEPENDENT_AMBULATORY_CARE_PROVIDER_SITE_OTHER): Payer: Medicaid Other | Admitting: Gastroenterology

## 2018-02-11 ENCOUNTER — Encounter (INDEPENDENT_AMBULATORY_CARE_PROVIDER_SITE_OTHER): Payer: Self-pay

## 2018-02-11 ENCOUNTER — Other Ambulatory Visit: Payer: Self-pay

## 2018-02-11 ENCOUNTER — Encounter: Payer: Self-pay | Admitting: Gastroenterology

## 2018-02-11 ENCOUNTER — Ambulatory Visit: Payer: Medicaid Other | Admitting: Nurse Practitioner

## 2018-02-11 VITALS — BP 123/82 | HR 82 | Ht 61.0 in | Wt 165.2 lb

## 2018-02-11 DIAGNOSIS — K625 Hemorrhage of anus and rectum: Secondary | ICD-10-CM

## 2018-02-11 DIAGNOSIS — K640 First degree hemorrhoids: Secondary | ICD-10-CM

## 2018-02-11 NOTE — Progress Notes (Signed)
Cephas Darby, MD [address]  [place]  [place],  [address]  Main: [phone]  Fax: [phone] Pager: [phone]   Primary Care Physician: [name], MD  Primary Gastroenterologist:  Dr. Cephas Darby  Chief Complaint  Patient presents with  . hemorrhoid banding    HPI: Kristina Reed is a 52 y.o. female who is regularly sees Dr. Vicente Males for eosinophilic esophagitis is referred to me for management of hemorrhoids. Patient reports that she underwent hemorrhoid banding by Dr [name] 3 years ago underwent 3 banding sessions and she did not think the procedure was helpful in alleviating her hemorrhoid symptoms. Her symptoms are predominantly itching, burning, swelling, pressure, rectal bleeding. She has history of chronic constipation and spends about 10-20 minutes time on toilet, due to significant straining/pushing. She consumes red meat, fried foods regularly. She has not tried any stool softener or fiber supplements.  Follow-up visit 01/19/2018 She tried linaclotide 127mcg which resulted in severe diarrhea and mild rectal bleeding which resolved. She no longer experiences diarrhea. Reports having one to 2 soft bowel movements daily, but associated with pushing. She denies any symptoms from hemorrhoids at this time. She is taking fiber supplements as recommended daily.  Follow-up visit 02/11/2018 She reports that her stools are very soft and resulted in incontinence on 2 separate occasions in last 2 days. She stopped all the fried foods. She is taking fiber supplements daily. She denies abdominal pain, nausea, vomiting, abdominal cramps, blood in stools. She denies taking any recent antibiotics, sick contacts or eating out. She is here for hemorrhoid banding today   Current Outpatient Medications:  .  FLUoxetine (PROZAC) 20 MG capsule, Take 20 mg by mouth daily., Disp: , Rfl: 5 .  levothyroxine (SYNTHROID, LEVOTHROID) 75 MCG tablet, Take 1 tablet (75 mcg  total) by mouth daily., Disp: 30 tablet, Rfl: 1 .  traZODone (DESYREL) 50 MG tablet, Take by mouth., Disp: , Rfl:  .  predniSONE (DELTASONE) 10 MG tablet, TAKE 6 TABLETS BY MOUTH ON DAY 1, THEN DECREASE BY 1 TAB DAILY ( 6, 5, 4, 3, 2, 1 ), Disp: , Rfl: 0 .  predniSONE (STERAPRED UNI-PAK 21 TAB) 10 MG (21) TBPK tablet, Take 6 pills on day one then decrease by 1 pill each day (Patient not taking: Reported on 02/11/2018), Disp: 21 tablet, Rfl: 0   Allergies as of 02/11/2018 - Review Complete 02/11/2018  Allergen Reaction Noted  . Percocet [oxycodone-acetaminophen] Palpitations 01/20/2015    NSAIDs: none  Antiplts/Anticoagulants/Anti thrombotics: none  GI procedures:  Colonoscopy on 1019 by Dr. Vicente Males - Non-bleeding internal hemorrhoids. - Diverticulosis in the sigmoid colon. - The examination was otherwise normal on direct and retroflexion views. - No specimens collected.  ROS:  General: Negative for anorexia, weight loss, fever, chills, fatigue, weakness. ENT: Negative for hoarseness, difficulty swallowing , nasal congestion. CV: Negative for chest pain, angina, palpitations, dyspnea on exertion, peripheral edema.  Respiratory: Negative for dyspnea at rest, dyspnea on exertion, cough, sputum, wheezing.  GI: See history of present illness. GU:  Negative for dysuria, hematuria, urinary incontinence, urinary frequency, nocturnal urination.  Endo: Negative for unusual weight change.    Physical Examination:   BP 123/82   Pulse 82   Ht 5\' 1"  (1.549 m)   Wt 165 lb 3.2 oz (74.9 kg)   LMP 05/09/1993 (Approximate)   BMI 31.21 kg/m   General: Well-nourished, well-developed in no acute distress.  Eyes: No icterus. Conjunctivae pink. Mouth: Oropharyngeal  mucosa moist and pink , no lesions erythema or exudate. Lungs: Clear to auscultation bilaterally. Non-labored. Heart: Regular rate and rhythm, no murmurs rubs or gallops.  Abdomen: Bowel sounds are normal, nontender, nondistended, no  hepatosplenomegaly or masses, no hernia , no rebound or guarding.   Extremities: No lower extremity edema. No clubbing or deformities. Neuro: Alert and oriented x 3.  Grossly intact. Skin: Warm and dry, no jaundice.   Psych: Alert and cooperative, normal mood and affect.   Imaging Studies: No results found.  Assessment and Plan:   Kristina Reed is a 52 y.o. female history of eosinophilic esophagitis treated with Flovent, in remission with chronic constipation and intermittent rectal bleeding here for follow-up Prior history of banding about 3 years ago with modest benefit  Chronic constipation: Resolved, her stools are in fact soft with urgency - continue high-fiber diet - discontinue fiber supplements - continue to avoid red meat and greasy foods  Symptomatic hemorrhoids: - Perform outpatient hemorrhoidal ligation today, consent obtained   Follow up in 2 weeks    Dr Sherri Sear, MD

## 2018-02-11 NOTE — Progress Notes (Signed)
PROCEDURE NOTE: The patient presents with symptomatic grade 1 hemorrhoids, unresponsive to maximal medical therapy, requesting rubber band ligation of his/her hemorrhoidal disease.  All risks, benefits and alternative forms of therapy were described and informed consent was obtained.  In the Left Lateral Decubitus position (if anoscopy is performed) anoscopic examination revealed grade 1 hemorrhoids in the all position(s).   The decision was made to band the LL internal hemorrhoid, and the [place] was used to perform band ligation without complication.  Digital anorectal examination was then performed to assure proper positioning of the band, and to adjust the banded tissue as required.  The patient was discharged home without pain or other issues.  Dietary and behavioral recommendations were given and (if necessary - prescriptions were given), along with follow-up instructions.  The patient will return 2 weeks for follow-up and possible additional banding as required.  No complications were encountered and the patient tolerated the procedure well.  Cephas Darby, MD [address]  [place]  [place], [place] [address]  Main: [phone]  Fax: [phone] Pager: [phone]

## 2018-02-14 ENCOUNTER — Encounter: Payer: Self-pay | Admitting: Nurse Practitioner

## 2018-02-14 ENCOUNTER — Ambulatory Visit: Payer: Self-pay | Admitting: Gastroenterology

## 2018-02-14 ENCOUNTER — Ambulatory Visit: Payer: Medicaid Other | Admitting: Nurse Practitioner

## 2018-02-14 VITALS — BP 122/84 | HR 75 | Temp 98.6°F | Resp 16 | Ht 61.0 in | Wt 166.4 lb

## 2018-02-14 DIAGNOSIS — D485 Neoplasm of uncertain behavior of skin: Secondary | ICD-10-CM

## 2018-02-14 NOTE — Progress Notes (Addendum)
Name: Kristina Reed   MRN: [id number]    DOB: 1966-07-14   Date:02/14/2018       Progress Note  Subjective  Chief Complaint  Chief Complaint  Patient presents with  . Nevus    on back    HPI  Left mid back mole has had it for years but has noted that is irritating, itchy, and tender at times. States sometimes when she scratches it a piece comes off. Patient had one a long time ago and had it cut off. Patient states is irritated with shirt.   Patient Active Problem List   Diagnosis Date Noted  . Essential hypertension 01/19/2018  . Obesity (BMI 30.0-34.9) 01/19/2018  . Preventative health care 09/03/2017  . Eosinophilic esophagitis 35/36/1443  . Apophysitis 08/26/2017  . 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 Pain in joint, multiple sites 07/05/2017  . Osteoarthritis of knee 07/05/2017  . Lichen sclerosus et atrophicus 04/27/2017  . Headache 04/27/2017  . Pharyngoesophageal dysphagia 03/24/2017  . Headache disorder 02/17/2017  . Post-concussion headache 02/17/2017  . Medication monitoring encounter 09/21/2016  . Hematuria 04/23/2016  . Hx of cervical cancer 01/30/2016  . Menopause 01/30/2016  . Status post bilateral breast biopsy 01/07/2016  . Chronic nausea 10/25/2015  . Screening for STD (sexually transmitted disease) 10/25/2015  . External hemorrhoid 05/03/2015  . Rectal bleeding 04/30/2015  . Right knee pain 07/23/2014  . Depression, major, recurrent, moderate ([place]) 02/02/2014  . Gastroesophagitis 02/02/2014  . Adult hypothyroidism 02/02/2014  . Obstructive apnea 02/02/2014  . Anxiety and depression 02/02/2014  . Incomplete bladder emptying 12/12/2012  . Tenosynovitis of foot 05/30/2012  . Benign neoplasm of kidney 05/13/2012  . Urge incontinence 05/13/2012  . FOM (frequency of micturition) 05/13/2012  . Chronic pain of right hand 05/05/2012  . Tarsal tunnel syndrome 03/03/2012    Past Medical History:  Diagnosis Date  . Anxiety   . Arthritis    right knee  and right elbow  . Cancer ([place])    Cervical CA with partial hysterectomy.  . Cognitive impairment, mild, so stated   . Depression   . Diverticulosis   . Eosinophilic esophagitis 15/40/0867   Biopsy Dec 2018  . Gallstones   . GERD (gastroesophageal reflux disease)   . Hx of cervical cancer 01/30/2016  . Hypertension   . Hypothyroidism   . Sleep apnea    does not use a C-PAP  . Status post partial hysterectomy    Due to Cervical CA  . Vaginal inclusion cyst     Past Surgical History:  Procedure Laterality Date  . ABDOMINAL HYSTERECTOMY     partial hysterectomy  . BREAST BIOPSY Bilateral 01/01/2016   [name] cores. Fibroadenomas  . COLONOSCOPY WITH PROPOFOL N/A 06/27/2015   Procedure: COLONOSCOPY WITH PROPOFOL;  Surgeon: Josefine Class, MD;  Location: [facility] ENDOSCOPY;  Service: Endoscopy;  Laterality: N/A;  . COLONOSCOPY WITH PROPOFOL N/A 09/23/2017   Procedure: COLONOSCOPY WITH PROPOFOL;  Surgeon: Jonathon Bellows, MD;  Location: [facility] ENDOSCOPY;  Service: Gastroenterology;  Laterality: N/A;  . ESOPHAGOGASTRODUODENOSCOPY (EGD) WITH PROPOFOL N/A 07/22/2017   Procedure: ESOPHAGOGASTRODUODENOSCOPY (EGD) WITH PROPOFOL;  Surgeon: Jonathon Bellows, MD;  Location: [facility] ENDOSCOPY;  Service: Gastroenterology;  Laterality: N/A;  . KNEE ARTHROSCOPY Right 07/22/2016   Procedure: ARTHROSCOPY KNEE debridement microfracture;  Surgeon: Leanor Kail, MD;  Location: [facility] ORS;  Service: Orthopedics;  Laterality: Right;  . KNEE ARTHROSCOPY WITH MEDIAL MENISECTOMY Left 11/10/2017   Procedure: KNEE ARTHROSCOPY WITH PARTIAL MEDIAL MENISECTOMY&patella femoral debridement, & ablation;  Surgeon: Leanor Kail, MD;  Location: [facility] ORS;  Service: Orthopedics;  Laterality: Left;  . TUBAL LIGATION      Social History   Tobacco Use  . Smoking status: Never Smoker  . Smokeless tobacco: Never Used  Substance Use Topics  . Alcohol use: No     Current Outpatient Medications:  .  FLUoxetine (PROZAC) 20 MG capsule,  Take 20 mg by mouth daily., Disp: , Rfl: 5 .  levothyroxine (SYNTHROID, LEVOTHROID) 75 MCG tablet, Take 1 tablet (75 mcg total) by mouth daily., Disp: 30 tablet, Rfl: 1 .  predniSONE (DELTASONE) 10 MG tablet, TAKE 6 TABLETS BY MOUTH ON DAY 1, THEN DECREASE BY 1 TAB DAILY ( 6, 5, 4, 3, 2, 1 ), Disp: , Rfl: 0 .  predniSONE (STERAPRED UNI-PAK 21 TAB) 10 MG (21) TBPK tablet, Take 6 pills on day one then decrease by 1 pill each day (Patient not taking: Reported on 02/11/2018), Disp: 21 tablet, Rfl: 0 .  traZODone (DESYREL) 50 MG tablet, Take by mouth., Disp: , Rfl:   Allergies  Allergen Reactions  . Percocet [Oxycodone-Acetaminophen] Palpitations    ROS  No other specific complaints in a complete review of systems (except as listed in HPI above).  Objective  Vitals:   02/14/18 1111  BP: 122/84  Pulse: 75  Resp: 16  Temp: 98.6 F (37 C)  TempSrc: Oral  SpO2: 97%  Weight: 166 lb 6.4 oz (75.5 kg)  Height: 5\' 1"  (1.549 m)    Body mass index is 31.44 kg/m.  Nursing Note and Vital Signs reviewed.  Physical Exam  Skin:        Constitutional: Patient appears well-developed and well-nourished.  No distress.  Cardiovascular: Normal rate, regular rhythm, S1/S2 present.   Pulmonary/Chest: Effort normal and breath sounds clear. Psychiatric: Patient has a normal mood and affect. behavior is normal. Judgment and thought content normal.  No results found for this or any previous visit (from the past 72 hour(s)).  Assessment & Plan  1. Inflamed seborrheic keratosis Attempted cryotherapy, patient unable to tolerate more than 5 seconds. Shave biopsy completed.   Consent signed: YES  Procedure: shave skin biopsy Location: left mid back Equipment used: sterile dermablade Anesthesia:  1% Lidocaine w/o Epinephrine  Cleaned and prepped: alcohol  After verbal consent obtained, affected area of skin prepped with alcohol and cryotherapy administered for 5 seconds- patient unable to  tolerate. Area was prepped with alcohol again. Lidocaine w/o epinephrine injected into surround areas and area of intended shave biopsy.After properly numbed, 1cm incision with dermalade made removing entirety of skin lesion and immediately surrounding areas.  Covered area with sterile guaze and pressure used immediately after procedure. After 2 minutes of pressure, antibiotic ointment, and dry gauze placed and taped. Instructed on dressing changes, materials given, pain medications prescribed. F/U for nursing visit for packing change if needed.   Follow up and care instructions discussed and provided in AVS. -Reviewed Health Maintenance: mammogram scheduled.   ------------------------------------- I have reviewed this encounter including the documentation in this note and/or discussed this patient with the provider, Suezanne Cheshire DNP AGNP-C. I am certifying that I agree with the content of this note as supervising physician. Enid Derry, [facility] 02/18/2018, 6:34 PM

## 2018-02-14 NOTE — Patient Instructions (Addendum)
Skin Biopsy, Care After Refer to this sheet in the next few weeks. These instructions provide you with information about caring for yourself after your procedure. Your health care provider may also give you more specific instructions. Your treatment has been planned according to current medical practices, but problems sometimes occur. Call your health care provider if you have any problems or questions after your procedure. What can I expect after the procedure? After the procedure, it is common to have:  Soreness.  Bruising.  Itching.  Follow these instructions at home:  Rest and then return to your normal activities as told by your health care provider.  Take tylenol 500mg  up to 4 times a day as needed for pain  Put ice pack for 5-10 minutes at a time over clothing for pain relief  Follow instructions from your health care provider about how to take care of your biopsy site.Make sure you: ? Wash your hands with soap and water before you change your bandage (dressing). If soap and water are not available, use hand sanitizer. ? Change your dressing in 24 hours, can use neosporin and clean bandage ? Avoid sun or bra irritation on site   Check your biopsy site every day for signs of infection. Check for: ? More redness, swelling, or pain. ? More fluid or blood. ? Warmth. ? Pus or a bad smell.  Keep all follow-up visits as told by your health care provider. This is important. Contact a health care provider if:  You have more redness, swelling, or pain around your biopsy site.  You have more fluid or blood coming from your biopsy site.  Your biopsy site feels warm to the touch.  You have pus or a bad smell coming from your biopsy site.  You have a fever. Get help right away if:  You have bleeding that does not stop with pressure or a dressing. This information is not intended to replace advice given to you by your health care provider. Make sure you discuss any questions  you have with your health care provider. Document Released: 09/27/2015 Document Revised: 04/26/2016 Document Reviewed: 11/28/2014 Elsevier Interactive Patient Education  [employer].

## 2018-02-16 LAB — PATHOLOGY

## 2018-02-16 LAB — TISSUE SPECIMEN

## 2018-02-18 NOTE — Addendum Note (Signed)
Addended by: Kameron Glazebrook, Satira Anis on: 02/18/2018 06:34 PM   Modules accepted: Orders

## 2018-02-22 ENCOUNTER — Ambulatory Visit
Admission: RE | Admit: 2018-02-22 | Discharge: 2018-02-22 | Disposition: A | Discharge status: Home / Self Care | Payer: Medicaid Other | Source: Ambulatory Visit | Attending: Family Medicine | Admitting: Family Medicine

## 2018-02-22 DIAGNOSIS — Z1231 Encounter for screening mammogram for malignant neoplasm of breast: Secondary | ICD-10-CM | POA: Insufficient documentation

## 2018-02-23 ENCOUNTER — Encounter: Payer: Self-pay | Admitting: Nurse Practitioner

## 2018-02-23 ENCOUNTER — Ambulatory Visit (INDEPENDENT_AMBULATORY_CARE_PROVIDER_SITE_OTHER): Payer: Medicaid Other | Admitting: Nurse Practitioner

## 2018-02-23 VITALS — BP 120/78 | HR 84 | Temp 98.0°F | Resp 16 | Ht 61.0 in | Wt 167.1 lb

## 2018-02-23 DIAGNOSIS — F331 Major depressive disorder, recurrent, moderate: Secondary | ICD-10-CM | POA: Diagnosis not present

## 2018-02-23 DIAGNOSIS — L820 Inflamed seborrheic keratosis: Secondary | ICD-10-CM

## 2018-02-23 DIAGNOSIS — T148XXA Other injury of unspecified body region, initial encounter: Secondary | ICD-10-CM

## 2018-02-23 DIAGNOSIS — Z114 Encounter for screening for human immunodeficiency virus [HIV]: Secondary | ICD-10-CM

## 2018-02-23 DIAGNOSIS — E039 Hypothyroidism, unspecified: Secondary | ICD-10-CM

## 2018-02-23 NOTE — Patient Instructions (Signed)
-   continue daily medications - checking Thyroid today will reorder medication based on results - use antibacterial ointment and band-aid on skin area on back

## 2018-02-23 NOTE — Progress Notes (Addendum)
Name: Kristina Reed   MRN: [id number]    DOB: [birth year]   Date:02/23/2018       Progress Note  Subjective  Chief Complaint  Chief Complaint  Patient presents with  . Follow-up    1 week recheck  . Hypothyroidism    medication refill    HPI  Takes synthroid 76mcg every day with no missed doses, has been on stable dose for a long time per patient. Patient denies cold/hot sensitivities, constipation/diarrhea. Lab Results  Component Value Date   TSH 1.84 09/03/2017    Pt notes skin lesion excision site is healing well but is itchy. Has been keeping it clean and dry.   Takes prozac daily without missed doses.  Depression screen [facility] 2/9 02/23/2018  Decreased Interest 0  Down, Depressed, Hopeless 1  PHQ - 2 Score 1  Altered sleeping 1  Tired, decreased energy 0  Change in appetite 0  Feeling bad or failure about yourself  0  Trouble concentrating 0  Moving slowly or fidgety/restless 0  Suicidal thoughts 0  PHQ-9 Score 2  Difficult doing work/chores Not difficult at all      Patient Active Problem List   Diagnosis Date Noted  . Essential hypertension 01/19/2018  . Obesity (BMI 30.0-34.9) 01/19/2018  . Preventative health care 09/03/2017  . Eosinophilic esophagitis 03/47/4259  . Apophysitis 08/26/2017  . 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 Pain in joint, multiple sites 07/05/2017  . Osteoarthritis of knee 07/05/2017  . Lichen sclerosus et atrophicus 04/27/2017  . Headache 04/27/2017  . Pharyngoesophageal dysphagia 03/24/2017  . Headache disorder 02/17/2017  . Post-concussion headache 02/17/2017  . Medication monitoring encounter 09/21/2016  . Hematuria 04/23/2016  . Hx of cervical cancer 01/30/2016  . Menopause 01/30/2016  . Status post bilateral breast biopsy 01/07/2016  . Chronic nausea 10/25/2015  . Screening for STD (sexually transmitted disease) 10/25/2015  . External hemorrhoid 05/03/2015  . Rectal bleeding 04/30/2015  . Right knee pain 07/23/2014  .  Depression, major, recurrent, moderate ([place]) 02/02/2014  . Gastroesophagitis 02/02/2014  . Adult hypothyroidism 02/02/2014  . Obstructive apnea 02/02/2014  . Anxiety and depression 02/02/2014  . Incomplete bladder emptying 12/12/2012  . Tenosynovitis of foot 05/30/2012  . Benign neoplasm of kidney 05/13/2012  . Urge incontinence 05/13/2012  . FOM (frequency of micturition) 05/13/2012  . Chronic pain of right hand 05/05/2012  . Tarsal tunnel syndrome 03/03/2012    Past Medical History:  Diagnosis Date  . Anxiety   . Arthritis    right knee and right elbow  . Cancer ([place])    Cervical CA with partial hysterectomy.  . Cognitive impairment, mild, so stated   . Depression   . Diverticulosis   . Eosinophilic esophagitis 56/38/7564   Biopsy Dec 2018  . Gallstones   . GERD (gastroesophageal reflux disease)   . Hx of cervical cancer 01/30/2016  . Hypertension   . Hypothyroidism   . Sleep apnea    does not use a C-PAP  . Status post partial hysterectomy    Due to Cervical CA  . Vaginal inclusion cyst     Past Surgical History:  Procedure Laterality Date  . ABDOMINAL HYSTERECTOMY     partial hysterectomy  . BREAST BIOPSY Bilateral 01/01/2016   [name] cores. Fibroadenomas  . COLONOSCOPY WITH PROPOFOL N/A 06/27/2015   Procedure: COLONOSCOPY WITH PROPOFOL;  Surgeon: Josefine Class, MD;  Location: [facility] ENDOSCOPY;  Service: Endoscopy;  Laterality: N/A;  . COLONOSCOPY WITH PROPOFOL N/A 09/23/2017  Procedure: COLONOSCOPY WITH PROPOFOL;  Surgeon: Jonathon Bellows, MD;  Location: [facility] ENDOSCOPY;  Service: Gastroenterology;  Laterality: N/A;  . ESOPHAGOGASTRODUODENOSCOPY (EGD) WITH PROPOFOL N/A 07/22/2017   Procedure: ESOPHAGOGASTRODUODENOSCOPY (EGD) WITH PROPOFOL;  Surgeon: Jonathon Bellows, MD;  Location: [facility] ENDOSCOPY;  Service: Gastroenterology;  Laterality: N/A;  . KNEE ARTHROSCOPY Right 07/22/2016   Procedure: ARTHROSCOPY KNEE debridement microfracture;  Surgeon: Leanor Kail, MD;   Location: [facility] ORS;  Service: Orthopedics;  Laterality: Right;  . KNEE ARTHROSCOPY WITH MEDIAL MENISECTOMY Left 11/10/2017   Procedure: KNEE ARTHROSCOPY WITH PARTIAL MEDIAL MENISECTOMY&patella femoral debridement, & ablation;  Surgeon: Leanor Kail, MD;  Location: [facility] ORS;  Service: Orthopedics;  Laterality: Left;  . TUBAL LIGATION      Social History   Tobacco Use  . Smoking status: Never Smoker  . Smokeless tobacco: Never Used  Substance Use Topics  . Alcohol use: No     Current Outpatient Medications:  .  FLUoxetine (PROZAC) 20 MG capsule, Take 20 mg by mouth daily., Disp: , Rfl: 5 .  levothyroxine (SYNTHROID, LEVOTHROID) 75 MCG tablet, Take 1 tablet (75 mcg total) by mouth daily., Disp: 30 tablet, Rfl: 1 .  traZODone (DESYREL) 50 MG tablet, Take by mouth., Disp: , Rfl:  .  predniSONE (DELTASONE) 10 MG tablet, TAKE 6 TABLETS BY MOUTH ON DAY 1, THEN DECREASE BY 1 TAB DAILY ( 6, 5, 4, 3, 2, 1 ), Disp: , Rfl: 0  Allergies  Allergen Reactions  . Percocet [Oxycodone-Acetaminophen] Palpitations    ROS    No other specific complaints in a complete review of systems (except as listed in HPI above).  Objective  Vitals:   02/23/18 0913  BP: 120/78  Pulse: 84  Resp: 16  Temp: 98 F (36.7 C)  TempSrc: Oral  SpO2: 96%  Weight: 167 lb 1.6 oz (75.8 kg)  Height: 5\' 1"  (1.549 m)     Body mass index is 31.57 kg/m.  Nursing Note and Vital Signs reviewed.  Physical Exam   Constitutional: Patient appears well-developed and well-nourished. Obese  No distress.  Neck: mild bilateral thryoimegaly- pt notes is baseline, no nodules palpated Cardiovascular: Normal rate, regular rhythm, S1/S2 present.   Pulmonary/Chest: Effort normal and breath sounds clear.  Skin: Left lower back mole removed- scabbed over, no heat, drainage noted.  Psychiatric: Patient has a normal mood and affect. behavior is normal. Judgment and thought content normal.  No results found for this or any  previous visit (from the past 72 hour(s)).  Assessment & Plan  1. Adult hypothyroidism - checking Thyroid today will reorder medication based on results - TSH  2. Screening for HIV (human immunodeficiency virus) Pt denies concerning behaviors but requesting to be checked - HIV antibody (with reflex)  3. Depression, major, recurrent, moderate ([facility]) Stable - continue daily medications  4. Open wound of skin - healing well  use antibacterial ointment and band-aid on skin area on back     - Follow up and care instructions discussed and provided in AVS.  ------------------------------------- I have reviewed this encounter including the documentation in this note and/or discussed this patient with the provider, Suezanne Cheshire DNP AGNP-C. I am certifying that I agree with the content of this note as supervising physician. Enid Derry, [facility] 02/23/2018, 3:09 PM

## 2018-02-24 ENCOUNTER — Other Ambulatory Visit: Payer: Self-pay | Admitting: Nurse Practitioner

## 2018-02-24 DIAGNOSIS — E039 Hypothyroidism, unspecified: Secondary | ICD-10-CM

## 2018-02-24 LAB — HIV ANTIBODY (ROUTINE TESTING W REFLEX): HIV 1&2 Ab, 4th Generation: NONREACTIVE

## 2018-02-24 LAB — TSH: TSH: 1.78 mIU/L

## 2018-02-24 MED ORDER — LEVOTHYROXINE SODIUM 75 MCG PO TABS: 75.0000 ug | ORAL_TABLET | Freq: Every day | ORAL | 1 refills | Status: DC

## 2018-03-02 ENCOUNTER — Encounter: Payer: Self-pay | Admitting: Gastroenterology

## 2018-03-02 ENCOUNTER — Ambulatory Visit: Payer: Self-pay

## 2018-03-02 ENCOUNTER — Ambulatory Visit (INDEPENDENT_AMBULATORY_CARE_PROVIDER_SITE_OTHER): Payer: Medicaid Other | Admitting: Gastroenterology

## 2018-03-02 VITALS — BP 121/83 | HR 77

## 2018-03-02 DIAGNOSIS — K640 First degree hemorrhoids: Secondary | ICD-10-CM

## 2018-03-02 NOTE — Progress Notes (Signed)
PROCEDURE NOTE: The patient presents with symptomatic grade 1 hemorrhoids, unresponsive to maximal medical therapy, requesting rubber band ligation of his/her hemorrhoidal disease.  All risks, benefits and alternative forms of therapy were described and informed consent was obtained.  The decision was made to band the RP internal hemorrhoid, and the [facility]an System was used to perform band ligation without complication.  Digital anorectal examination was then performed to assure proper positioning of the band, and to adjust the banded tissue as required.  The patient was discharged home without pain or other issues.  Dietary and behavioral recommendations were given and (if necessary - prescriptions were given), along with follow-up instructions.  The patient will return 2 weeks for follow-up and possible additional banding as required.  No complications were encountered and the patient tolerated the procedure well.  Ridley Dileo R Kassy Mcenroe, MD [address]  [address]  [place], [place] [address]  Main: [phone]  Fax: [phone] Pager: [phone]  

## 2018-03-02 NOTE — Telephone Encounter (Signed)
Pt. States "It feels like there is something in my throat.I have had this before and they had to stretch my throat." Denies pain. Eating and drinking. Request appointment next week - "when I can get a ride." Appointment made by agent.  Reason for Disposition . [1] Sore throat is the only symptom AND [2] present > 48 hours  Answer Assessment - Initial Assessment Questions 1. ONSET: "When did the throat start hurting?" (Hours or days ago)      Started 2 days ago 2. SEVERITY: "How bad is the sore throat?" (Scale 1-10; mild, moderate or severe)   - MILD (1-3):  doesn't interfere with eating or normal activities   - MODERATE (4-7): interferes with eating some solids and normal activities   - SEVERE (8-10):  excruciating pain, interferes with most normal activities   - SEVERE DYSPHAGIA: can't swallow liquids, drooling     Moderate 3. STREP EXPOSURE: "Has there been any exposure to strep within the past week?" If so, ask: "What type of contact occurred?"      No 4.  VIRAL SYMPTOMS: "Are there any symptoms of a cold, such as a runny nose, cough, hoarse voice or red eyes?"      No 5. FEVER: "Do you have a fever?" If so, ask: "What is your temperature, how was it measured, and when did it start?"     No 6. PUS ON THE TONSILS: "Is there pus on the tonsils in the back of your throat?"     No 7. OTHER SYMPTOMS: "Do you have any other symptoms?" (e.g., difficulty breathing, headache, rash)     Feels like there is something in my throat 8. PREGNANCY: "Is there any chance you are pregnant?" "When was your last menstrual period?"     No  Protocols used: SORE THROAT-A-AH

## 2018-03-08 ENCOUNTER — Encounter: Payer: Self-pay | Admitting: Nurse Practitioner

## 2018-03-08 ENCOUNTER — Ambulatory Visit (INDEPENDENT_AMBULATORY_CARE_PROVIDER_SITE_OTHER): Payer: Medicaid Other | Admitting: Nurse Practitioner

## 2018-03-08 VITALS — BP 110/70 | HR 81 | Temp 98.1°F | Resp 16 | Ht 61.0 in | Wt 168.9 lb

## 2018-03-08 DIAGNOSIS — H6121 Impacted cerumen, right ear: Secondary | ICD-10-CM | POA: Diagnosis not present

## 2018-03-08 DIAGNOSIS — R09A2 Foreign body sensation, throat: Secondary | ICD-10-CM

## 2018-03-08 DIAGNOSIS — K209 Esophagitis, unspecified: Secondary | ICD-10-CM

## 2018-03-08 DIAGNOSIS — K2970 Gastritis, unspecified, without bleeding: Secondary | ICD-10-CM | POA: Diagnosis not present

## 2018-03-08 DIAGNOSIS — R0989 Other specified symptoms and signs involving the circulatory and respiratory systems: Secondary | ICD-10-CM

## 2018-03-08 DIAGNOSIS — R1314 Dysphagia, pharyngoesophageal phase: Secondary | ICD-10-CM

## 2018-03-08 NOTE — Progress Notes (Addendum)
Name: Kristina Reed   MRN: [id number]    DOB: 03-12-1966   Date:03/08/2018       Progress Note  Subjective  Chief Complaint  Chief Complaint  Patient presents with  . Swallowed Foreign Body    feels like somwthing is in her throat    HPI  Patient states feels the sensation that her throat- area in neck is closing. States had this sensation before and had esophageal stretching- twice. Denies choking on foods. States chews up food really well- states goes down easy but still feels like something is there. Denies any lumps, cold/hot intolerances, constipation/diarrhea, sleep is baseline, no URI symptoms, shortness of breath, wheezing.   Patient Active Problem List   Diagnosis Date Noted  . Essential hypertension 01/19/2018  . Obesity (BMI 30.0-34.9) 01/19/2018  . Preventative health care 09/03/2017  . Eosinophilic esophagitis 95/62/1308  . Apophysitis 08/26/2017  . 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 Pain in joint, multiple sites 07/05/2017  . Osteoarthritis of knee 07/05/2017  . Lichen sclerosus et atrophicus 04/27/2017  . Headache 04/27/2017  . Pharyngoesophageal dysphagia 03/24/2017  . Headache disorder 02/17/2017  . Post-concussion headache 02/17/2017  . Medication monitoring encounter 09/21/2016  . Hematuria 04/23/2016  . Hx of cervical cancer 01/30/2016  . Menopause 01/30/2016  . Status post bilateral breast biopsy 01/07/2016  . Chronic nausea 10/25/2015  . Screening for STD (sexually transmitted disease) 10/25/2015  . External hemorrhoid 05/03/2015  . Rectal bleeding 04/30/2015  . Right knee pain 07/23/2014  . Depression, major, recurrent, moderate ([place]) 02/02/2014  . Gastroesophagitis 02/02/2014  . Adult hypothyroidism 02/02/2014  . Obstructive apnea 02/02/2014  . Anxiety and depression 02/02/2014  . Incomplete bladder emptying 12/12/2012  . Tenosynovitis of foot 05/30/2012  . Benign neoplasm of kidney 05/13/2012  . Urge incontinence 05/13/2012  . FOM (frequency  of micturition) 05/13/2012  . Chronic pain of right hand 05/05/2012  . Tarsal tunnel syndrome 03/03/2012    Past Medical History:  Diagnosis Date  . Anxiety   . Arthritis    right knee and right elbow  . Cancer ([place])    Cervical CA with partial hysterectomy.  . Cognitive impairment, mild, so stated   . Depression   . Diverticulosis   . Eosinophilic esophagitis 65/78/4696   Biopsy Dec 2018  . Gallstones   . GERD (gastroesophageal reflux disease)   . Hx of cervical cancer 01/30/2016  . Hypertension   . Hypothyroidism   . Sleep apnea    does not use a C-PAP  . Status post partial hysterectomy    Due to Cervical CA  . Vaginal inclusion cyst     Past Surgical History:  Procedure Laterality Date  . ABDOMINAL HYSTERECTOMY     partial hysterectomy  . BREAST BIOPSY Bilateral 01/01/2016   [name] cores. Fibroadenomas  . COLONOSCOPY WITH PROPOFOL N/A 06/27/2015   Procedure: COLONOSCOPY WITH PROPOFOL;  Surgeon: Josefine Class, MD;  Location: [facility] ENDOSCOPY;  Service: Endoscopy;  Laterality: N/A;  . COLONOSCOPY WITH PROPOFOL N/A 09/23/2017   Procedure: COLONOSCOPY WITH PROPOFOL;  Surgeon: Jonathon Bellows, MD;  Location: [facility] ENDOSCOPY;  Service: Gastroenterology;  Laterality: N/A;  . ESOPHAGOGASTRODUODENOSCOPY (EGD) WITH PROPOFOL N/A 07/22/2017   Procedure: ESOPHAGOGASTRODUODENOSCOPY (EGD) WITH PROPOFOL;  Surgeon: Jonathon Bellows, MD;  Location: [facility] ENDOSCOPY;  Service: Gastroenterology;  Laterality: N/A;  . KNEE ARTHROSCOPY Right 07/22/2016   Procedure: ARTHROSCOPY KNEE debridement microfracture;  Surgeon: Leanor Kail, MD;  Location: [facility] ORS;  Service: Orthopedics;  Laterality: Right;  . KNEE ARTHROSCOPY  WITH MEDIAL MENISECTOMY Left 11/10/2017   Procedure: KNEE ARTHROSCOPY WITH PARTIAL MEDIAL MENISECTOMY&patella femoral debridement, & ablation;  Surgeon: Leanor Kail, MD;  Location: [facility] ORS;  Service: Orthopedics;  Laterality: Left;  . TUBAL LIGATION      Social History   Tobacco  Use  . Smoking status: Never Smoker  . Smokeless tobacco: Never Used  Substance Use Topics  . Alcohol use: No     Current Outpatient Medications:  .  FLUoxetine (PROZAC) 20 MG capsule, Take 20 mg by mouth daily., Disp: , Rfl: 5 .  levothyroxine (SYNTHROID, LEVOTHROID) 75 MCG tablet, Take 1 tablet (75 mcg total) by mouth daily., Disp: 90 tablet, Rfl: 1 .  traZODone (DESYREL) 50 MG tablet, Take by mouth., Disp: , Rfl:  .  predniSONE (DELTASONE) 10 MG tablet, TAKE 6 TABLETS BY MOUTH ON DAY 1, THEN DECREASE BY 1 TAB DAILY ( 6, 5, 4, 3, 2, 1 ), Disp: , Rfl: 0  Allergies  Allergen Reactions  . Percocet [Oxycodone-Acetaminophen] Palpitations    ROS .  No other specific complaints in a complete review of systems (except as listed in HPI above).  Objective  Vitals:   03/08/18 0854  BP: 110/70  Pulse: 81  Resp: 16  Temp: 98.1 F (36.7 C)  TempSrc: Oral  SpO2: 96%  Weight: 168 lb 14.4 oz (76.6 kg)  Height: 5\' 1"  (1.549 m)     Body mass index is 31.91 kg/m.  Nursing Note and Vital Signs reviewed.  Physical Exam  Constitutional: Patient appears well-developed and well-nourished. Obese  No distress.  HEENT: head atraumatic, normocephalic, pupils equal and reactive to light, TM's without erythema or bulging- Right TM impacted, clear after lavage. , neck supple without lymphadenopathy, oropharynx pink and moist without exudate, no angioedema noted,no nasal discharge, no thyromegaly or nodules palpated,  Cardiovascular: Normal rate, regular rhythm, S1/S2 present.  Pulmonary/Chest: Effort normal and breath sounds clear. No respiratory distress or retractions. No stridor.  Psychiatric: Patient has a normal mood and affect. behavior is normal. Judgment and thought content normal.  No results found for this or any previous visit (from the past 72 hour(s)).  Assessment & Plan  1. Foreign body sensation in throat Stricture- history of such with same senation vs thyroid mass- none  palpated vs allergic reaction- least likely for ongoing symptoms for 4 days no respiratory compromise Will start with GI and work-up further if necessary, discussed strict ER precautions 2. Pharyngoesophageal dysphagia  - Ambulatory referral to Gastroenterology  3. Gastroesophagitis Unknown why patient was taken of PPI discuss with GI  - Ambulatory referral to Gastroenterology  4. Impacted cerumen of right ear  - Ear Lavage     -Red flags and when to present for emergency care or RTC including fever >101.13F, chest pain, shortness of breath, new/worsening/un-resolving symptoms, choking, unable to swallow reviewed with patient at time of visit. Follow up and care instructions discussed and provided in AVS.  --------------------------------------- I have reviewed this encounter including the documentation in this note and/or discussed this patient with the provider, Suezanne Cheshire DNP AGNP-C. I am certifying that I agree with the content of this note as supervising physician. Enid Derry, [facility] 03/08/2018, 1:34 PM

## 2018-03-23 ENCOUNTER — Telehealth: Payer: Self-pay | Admitting: Gastroenterology

## 2018-03-23 NOTE — Telephone Encounter (Signed)
Patient LVM that she is bleeding from her rectum when she wipes and it's in the toilet. Please call and advise patient what to do.

## 2018-03-24 NOTE — Telephone Encounter (Signed)
Patient called and stated she is bleeding from her rectum when she wipes, please advise

## 2018-03-25 ENCOUNTER — Ambulatory Visit (INDEPENDENT_AMBULATORY_CARE_PROVIDER_SITE_OTHER): Payer: Medicaid Other | Admitting: Gastroenterology

## 2018-03-25 ENCOUNTER — Encounter: Payer: Self-pay | Admitting: Gastroenterology

## 2018-03-25 ENCOUNTER — Other Ambulatory Visit: Payer: Self-pay

## 2018-03-25 ENCOUNTER — Other Ambulatory Visit
Admission: RE | Admit: 2018-03-25 | Discharge: 2018-03-25 | Disposition: A | Discharge status: Home / Self Care | Payer: Medicaid Other | Source: Ambulatory Visit | Attending: Gastroenterology | Admitting: Gastroenterology

## 2018-03-25 VITALS — BP 124/83 | HR 71 | Resp 17 | Ht 61.0 in | Wt 165.6 lb

## 2018-03-25 DIAGNOSIS — R1319 Other dysphagia: Secondary | ICD-10-CM

## 2018-03-25 DIAGNOSIS — K625 Hemorrhage of anus and rectum: Secondary | ICD-10-CM

## 2018-03-25 DIAGNOSIS — K200 Eosinophilic esophagitis: Secondary | ICD-10-CM

## 2018-03-25 DIAGNOSIS — K640 First degree hemorrhoids: Secondary | ICD-10-CM | POA: Diagnosis not present

## 2018-03-25 DIAGNOSIS — R1310 Dysphagia, unspecified: Secondary | ICD-10-CM | POA: Diagnosis not present

## 2018-03-25 LAB — CBC
HCT: 39.1 % (ref 35.0–47.0)
HEMOGLOBIN: 13.3 g/dL (ref 12.0–16.0)
MCH: 29.5 pg (ref 26.0–34.0)
MCHC: 33.9 g/dL (ref 32.0–36.0)
MCV: 87 fL (ref 80.0–100.0)
Platelets: 240 10*3/uL (ref 150–440)
RBC: 4.49 MIL/uL (ref 3.80–5.20)
RDW: 14.2 % (ref 11.5–14.5)
WBC: 6.7 10*3/uL (ref 3.6–11.0)

## 2018-03-25 MED ORDER — OMEPRAZOLE 20 MG PO CPDR: 20.0000 mg | DELAYED_RELEASE_CAPSULE | Freq: Two times a day (BID) | ORAL | 0 refills | Status: DC

## 2018-03-25 NOTE — Progress Notes (Signed)
Kristina Darby, MD [address]  [place]  [place], [place] [address]  Main: [phone]  Fax: [phone] Pager: [phone]   Primary Care Physician: [name], MD  Primary Gastroenterologist:  Dr. Cephas Reed  Chief Complaint  Patient presents with  . Follow-up    Banding ( #3)    HPI: Kristina Reed is a 52 y.o. female who is regularly sees Dr. Vicente Males for eosinophilic esophagitis is referred to me for management of hemorrhoids. Patient reports that she underwent hemorrhoid banding by Dr [name] 3 years ago underwent 3 banding sessions and she did not think the procedure was helpful in alleviating her hemorrhoid symptoms. Her symptoms are predominantly itching, burning, swelling, pressure, rectal bleeding. She has history of chronic constipation and spends about 10-20 minutes time on toilet, due to significant straining/pushing. She consumes red meat, fried foods regularly. She has not tried any stool softener or fiber supplements.  Follow-up visit 01/19/2018 She tried linaclotide 160mcg which resulted in severe diarrhea and mild rectal bleeding which resolved. She no longer experiences diarrhea. Reports having one to 2 soft bowel movements daily, but associated with pushing. She denies any symptoms from hemorrhoids at this time. She is taking fiber supplements as recommended daily.  Follow-up visit 02/11/2018 She reports that her stools are very soft and resulted in incontinence on 2 separate occasions in last 2 days. She stopped all the fried foods. She is taking fiber supplements daily. She denies abdominal pain, nausea, vomiting, abdominal cramps, blood in stools. She denies taking any recent antibiotics, sick contacts or eating out. She is here for hemorrhoid banding today  Follow-up visit 03/25/2018 She reports recurrence of dysphagia to solids. She was treated with Flovent for 8 weeks in the past for his peptic esophagitis. She is currently off PPI as well.  She is concerned about intermittent episodes of painless rectal bleeding both on wiping and dripping. She denies constipation. She reports some rectal discomfort every time she has a bowel movement. She is here to undergo hemorrhoid ligation.   Current Outpatient Medications:  .  levothyroxine (SYNTHROID, LEVOTHROID) 75 MCG tablet, Take 1 tablet (75 mcg total) by mouth daily., Disp: 90 tablet, Rfl: 1 .  traZODone (DESYREL) 50 MG tablet, Take by mouth., Disp: , Rfl:  .  omeprazole (PRILOSEC) 20 MG capsule, Take 1 capsule (20 mg total) by mouth 2 (two) times daily before a meal., Disp: 180 capsule, Rfl: 0   Allergies as of 03/25/2018 - Review Complete 03/25/2018  Allergen Reaction Noted  . Percocet [oxycodone-acetaminophen] Palpitations 01/20/2015    NSAIDs: none  Antiplts/Anticoagulants/Anti thrombotics: none  GI procedures:  Upper endoscopy 07/22/2017 - Normal examined duodenum. - Gastritis. Biopsied. - Salmon-colored mucosa suspicious for Barrett's esophagus. Biopsied. - Normal mucosa was found in the entire esophagus. Biopsied. DIAGNOSIS:  A. STOMACH; COLD BIOPSY:  - ANTRAL MUCOSA WITH CHANGES CONSISTENT WITH HEALING MUCOSAL INJURY.  - NEGATIVE FOR H. PYLORI, DYSPLASIA, AND MALIGNANCY.   B. ESOPHAGUS, DISTAL; COLD BIOPSY:  - GASTROESOPHAGITIS.  - UP TO 40 INTRAEPITHELIAL EOSINOPHILS IN A HIGH-POWER FIELD WITH  EOSINOPHILIC MICROABSCESS FORMATION.  - PANCREATIC ACINAR CELL METAPLASIA.  - NEGATIVE FOR INTESTINAL METAPLASIA, DYSPLASIA, AND MALIGNANCY.   C. ESOPHAGUS; RANDOM BIOPSY:  - SQUAMOUS MUCOSA WITH AN INCREASE IN INTRAEPITHELIAL EOSINOPHILS  INVOLVING ALL BIOPSY FRAGMENTS, SEE COMMENT.  - UP TO 55 EOSINOPHILS IN A HIGH-POWER FIELD WITH EOSINOPHILIC  MICROABSCESS FORMATION.  - NEGATIVE FOR DYSPLASIA AND MALIGNANCY.   Colonoscopy on  09/2017 by Dr. Vicente Males - Non-bleeding internal hemorrhoids. - Diverticulosis in the sigmoid colon. - The examination was otherwise normal on  direct and retroflexion views. - No specimens collected.  ROS:  General: Negative for anorexia, weight loss, fever, chills, fatigue, weakness. ENT: Negative for hoarseness, difficulty swallowing , nasal congestion. CV: Negative for chest pain, angina, palpitations, dyspnea on exertion, peripheral edema.  Respiratory: Negative for dyspnea at rest, dyspnea on exertion, cough, sputum, wheezing.  GI: See history of present illness. GU:  Negative for dysuria, hematuria, urinary incontinence, urinary frequency, nocturnal urination.  Endo: Negative for unusual weight change.    Physical Examination:   BP 124/83 (BP Location: Left Arm, Patient Position: Sitting, Cuff Size: Large)   Pulse 71   Resp 17   Ht 5\' 1"  (1.549 m)   Wt 165 lb 9.6 oz (75.1 kg)   LMP 05/09/1993 (Approximate)   BMI 31.29 kg/m   General: Well-nourished, well-developed in no acute distress.  Eyes: No icterus. Conjunctivae pink. Mouth: Oropharyngeal mucosa moist and pink , no lesions erythema or exudate. Lungs: Clear to auscultation bilaterally. Non-labored. Heart: Regular rate and rhythm, no murmurs rubs or gallops.  Abdomen: Bowel sounds are normal, nontender, nondistended, no hepatosplenomegaly or masses, no hernia , no rebound or guarding.   Rectal exam: moderate rectal tenderness, normal perianal exam, hypopigmented perianal skin Extremities: No lower extremity edema. No clubbing or deformities. Neuro: Alert and oriented x 3.  Grossly intact. Skin: Warm and dry, no jaundice.   Psych: Alert and cooperative, normal mood and affect.   Imaging Studies: No results found.  Assessment and Plan:   Kristina Reed is a 53 y.o. female history of eosinophilic esophagitis treated with Flovent in the past, chronic constipation and intermittent rectal bleeding here for follow-up. Prior history of banding about 3 years ago with modest benefit. She now has recurrence of dysphagia  Dysphagia to solids: with history of  eosinophilic Esophagitis, I am worried about flareup asshe has not been on treatment for quite sometime - start omeprazole 20 mg twice daily - Samples of Dexilant given today as patient said she won't be able to afford the medication until August 1 - Recommend EGD with biopsies  Chronic constipation: Resolved - continue high-fiber diet - continue to avoid red meat and greasy foods  Symptomatic hemorrhoids: - I performed hemorrhoidal ligation x 2 in the past - I will defer repeat ligation today as patient has moderate rectal tenderness  - recommend topical nitroglycerin 2 times daily, instructions provided - recheck hemoglobin as patient is worried about ongoing rectal bleeding  Follow up in 8 weeks    Dr Sherri Sear, MD

## 2018-03-30 ENCOUNTER — Encounter: Payer: Self-pay | Admitting: Student

## 2018-03-31 ENCOUNTER — Ambulatory Visit: Payer: Medicaid Other | Admitting: Anesthesiology

## 2018-03-31 ENCOUNTER — Encounter: Payer: Self-pay | Admitting: Certified Registered Nurse Anesthetist

## 2018-03-31 ENCOUNTER — Encounter
Admission: RE | Disposition: A | Discharge status: Home / Self Care | Payer: Self-pay | Source: Ambulatory Visit | Attending: Gastroenterology

## 2018-03-31 ENCOUNTER — Ambulatory Visit
Admission: RE | Admit: 2018-03-31 | Discharge: 2018-03-31 | Disposition: A | Discharge status: Home / Self Care | Payer: Medicaid Other | Source: Ambulatory Visit | Attending: Gastroenterology | Admitting: Gastroenterology

## 2018-03-31 DIAGNOSIS — Z8541 Personal history of malignant neoplasm of cervix uteri: Secondary | ICD-10-CM | POA: Diagnosis not present

## 2018-03-31 DIAGNOSIS — G4730 Sleep apnea, unspecified: Secondary | ICD-10-CM | POA: Insufficient documentation

## 2018-03-31 DIAGNOSIS — Z79890 Hormone replacement therapy (postmenopausal): Secondary | ICD-10-CM | POA: Insufficient documentation

## 2018-03-31 DIAGNOSIS — K200 Eosinophilic esophagitis: Secondary | ICD-10-CM

## 2018-03-31 DIAGNOSIS — F329 Major depressive disorder, single episode, unspecified: Secondary | ICD-10-CM | POA: Diagnosis not present

## 2018-03-31 DIAGNOSIS — R1319 Other dysphagia: Secondary | ICD-10-CM

## 2018-03-31 DIAGNOSIS — Z8719 Personal history of other diseases of the digestive system: Secondary | ICD-10-CM | POA: Diagnosis not present

## 2018-03-31 DIAGNOSIS — Z79899 Other long term (current) drug therapy: Secondary | ICD-10-CM | POA: Diagnosis not present

## 2018-03-31 DIAGNOSIS — R1310 Dysphagia, unspecified: Secondary | ICD-10-CM | POA: Insufficient documentation

## 2018-03-31 DIAGNOSIS — I10 Essential (primary) hypertension: Secondary | ICD-10-CM | POA: Diagnosis not present

## 2018-03-31 DIAGNOSIS — K219 Gastro-esophageal reflux disease without esophagitis: Secondary | ICD-10-CM | POA: Diagnosis not present

## 2018-03-31 DIAGNOSIS — G3184 Mild cognitive impairment, so stated: Secondary | ICD-10-CM | POA: Diagnosis not present

## 2018-03-31 DIAGNOSIS — E039 Hypothyroidism, unspecified: Secondary | ICD-10-CM | POA: Diagnosis not present

## 2018-03-31 HISTORY — PX: ESOPHAGOGASTRODUODENOSCOPY (EGD) WITH PROPOFOL: SHX5813

## 2018-03-31 SURGERY — ESOPHAGOGASTRODUODENOSCOPY (EGD) WITH PROPOFOL
Anesthesia: General

## 2018-03-31 MED ORDER — PROPOFOL 500 MG/50ML IV EMUL
INTRAVENOUS | Status: AC
  Filled 2018-03-31: qty 50

## 2018-03-31 MED ORDER — PROPOFOL 500 MG/50ML IV EMUL
INTRAVENOUS | Status: DC | PRN
  Administered 2018-03-31: 175 ug/kg/min via INTRAVENOUS

## 2018-03-31 MED ORDER — PROPOFOL 10 MG/ML IV BOLUS
INTRAVENOUS | Status: DC | PRN
  Administered 2018-03-31: 60 mg via INTRAVENOUS

## 2018-03-31 MED ORDER — SODIUM CHLORIDE 0.9 % IV SOLN
INTRAVENOUS | Status: DC
  Administered 2018-03-31: 1000 mL via INTRAVENOUS

## 2018-03-31 MED ORDER — LIDOCAINE HCL (CARDIAC) PF 100 MG/5ML IV SOSY
PREFILLED_SYRINGE | INTRAVENOUS | Status: DC | PRN
  Administered 2018-03-31: 50 mg via INTRAVENOUS

## 2018-03-31 MED ORDER — LIDOCAINE HCL (PF) 2 % IJ SOLN
INTRAMUSCULAR | Status: AC
  Filled 2018-03-31: qty 10

## 2018-03-31 NOTE — Op Note (Signed)
[facility] Patient Name: Kristina Reed Procedure Date: 03/31/2018 9:00 AM MRN: [id number] Account #: [id number] Date of Birth: 1966-08-08 Admit Type: Outpatient Age: 52 Room: [facility] ENDO ROOM 3 Gender: Female Note Status: Finalized Procedure:            Upper GI endoscopy Indications:          Esophageal dysphagia, Follow-up of eosinophilic                        esophagitis Providers:            Lin Landsman MD, MD Referring MD:         [name] (Referring MD) Medicines:            Monitored Anesthesia Care Complications:        No immediate complications. Estimated blood loss: None. Procedure:            Pre-Anesthesia Assessment:                       - Prior to the procedure, a History and Physical was                        performed, and patient medications and allergies were                        reviewed. The patient is competent. The risks and                        benefits of the procedure and the sedation options and                        risks were discussed with the patient. All questions                        were answered and informed consent was obtained.                        Patient identification and proposed procedure were                        verified by the physician, the nurse, the                        anesthesiologist, the anesthetist and the technician in                        the pre-procedure area in the procedure room in the                        endoscopy suite. Mental Status Examination: alert and                        oriented. Airway Examination: normal oropharyngeal                        airway and neck mobility. Respiratory Examination:                        clear to auscultation. CV Examination: normal.  Prophylactic Antibiotics: The patient does not require                        prophylactic antibiotics. Prior Anticoagulants: The                        patient has  taken no previous anticoagulant or                        antiplatelet agents. ASA Grade Assessment: III - A                        patient with severe systemic disease. After reviewing                        the risks and benefits, the patient was deemed in                        satisfactory condition to undergo the procedure. The                        anesthesia plan was to use monitored anesthesia care                        (MAC). Immediately prior to administration of                        medications, the patient was re-assessed for adequacy                        to receive sedatives. The heart rate, respiratory rate,                        oxygen saturations, blood pressure, adequacy of                        pulmonary ventilation, and response to care were                        monitored throughout the procedure. The physical status                        of the patient was re-assessed after the procedure.                       After obtaining informed consent, the endoscope was                        passed under direct vision. Throughout the procedure,                        the patient's blood pressure, pulse, and oxygen                        saturations were monitored continuously. The Endoscope                        was introduced through the mouth, and advanced to the  second part of duodenum. The upper GI endoscopy was                        accomplished without difficulty. The patient tolerated                        the procedure fairly well. Findings:      The duodenal bulb and second portion of the duodenum were normal.      The entire examined stomach was normal.      The cardia and gastric fundus were normal on retroflexion.      Esophagogastric landmarks were identified: the gastroesophageal junction       was found at 35 cm from the incisors.      The examined esophagus was normal. Biopsies were obtained from the       proximal and  distal esophagus with cold forceps for histology of       suspected eosinophilic esophagitis. Impression:           - Normal duodenal bulb and second portion of the                        duodenum.                       - Normal stomach.                       - Esophagogastric landmarks identified.                       - Normal esophagus. Biopsied. Recommendation:       - Discharge patient to home (with escort).                       - Resume regular diet today.                       - Continue present medications.                       - Await pathology results.                       - Use Prilosec (omeprazole) 40 mg PO BID.                       - Return to my office as previously scheduled. Procedure Code(s):    --- Professional ---                       [phone], Esophagogastroduodenoscopy, flexible, transoral;                        with biopsy, single or multiple Diagnosis Code(s):    --- Professional ---                       R13.14, Dysphagia, pharyngoesophageal phase                       U04.5, Eosinophilic esophagitis CPT copyright 2017 American [employer] Association. All rights reserved. The codes documented in this report are preliminary and upon coder review may  be revised to meet current compliance requirements. Dr.  Ronini Hanad Leino Raeanne Gathers MD, MD 03/31/2018 11:10:18 AM This report has been signed electronically. Number of Addenda: 0 Note Initiated On: 03/31/2018 9:00 AM      [facility]

## 2018-03-31 NOTE — Transfer of Care (Signed)
Immediate Anesthesia Transfer of Care Note  Patient: Kristina Reed  Procedure(s) Performed: ESOPHAGOGASTRODUODENOSCOPY (EGD) WITH PROPOFOL (N/A )  Patient Location: PACU  Anesthesia Type:General  Level of Consciousness: awake, alert  and oriented  Airway & Oxygen Therapy: Patient Spontanous Breathing and Patient connected to nasal cannula oxygen  Post-op Assessment: Report given to RN and Post -op Vital signs reviewed and stable  Post vital signs: Reviewed and stable  Last Vitals:  Vitals Value Taken Time  BP 156/90 03/31/2018 11:11 AM  Temp 36.1 C 03/31/2018 11:11 AM  Pulse 81 03/31/2018 11:11 AM  Resp 17 03/31/2018 11:11 AM  SpO2 96 % 03/31/2018 11:11 AM  Vitals shown include unvalidated device data.  Last Pain:  Vitals:   03/31/18 1111  TempSrc: Tympanic  PainSc: 0-No pain         Complications: No apparent anesthesia complications

## 2018-03-31 NOTE — Anesthesia Post-op Follow-up Note (Signed)
Anesthesia QCDR form completed.        

## 2018-03-31 NOTE — Anesthesia Preprocedure Evaluation (Signed)
Anesthesia Evaluation  Patient identified by MRN, date of birth, ID band Patient awake    Reviewed: Allergy & Precautions, H&P , NPO status , Patient's Chart, lab work & pertinent test results  History of Anesthesia Complications Negative for: history of anesthetic complications  Airway Mallampati: III  TM Distance: <3 FB Neck ROM: limited    Dental  (+) Poor Dentition, Edentulous Upper, Edentulous Lower   Pulmonary neg shortness of breath, sleep apnea ,           Cardiovascular Exercise Tolerance: Good hypertension, (-) angina(-) Past MI and (-) DOE      Neuro/Psych  Headaches, PSYCHIATRIC DISORDERS Anxiety Depression  Neuromuscular disease    GI/Hepatic Neg liver ROS, GERD  Medicated and Controlled,  Endo/Other  Hypothyroidism   Renal/GU Renal disease  negative genitourinary   Musculoskeletal  (+) Arthritis ,   Abdominal   Peds  Hematology negative hematology ROS (+)   Anesthesia Other Findings Past Medical History: No date: Anxiety No date: Arthritis     Comment:  right knee and right elbow No date: Cancer [facility])     Comment:  Cervical CA with partial hysterectomy. No date: Cognitive impairment, mild, so stated No date: Depression No date: Diverticulosis 08/65/7846: Eosinophilic esophagitis     Comment:  Biopsy Dec 2018 No date: Gallstones No date: GERD (gastroesophageal reflux disease) 01/30/2016: Hx of cervical cancer No date: Hypertension No date: Hypothyroidism No date: Sleep apnea     Comment:  does not use a C-PAP No date: Status post partial hysterectomy     Comment:  Due to Cervical CA No date: Vaginal inclusion cyst  Past Surgical History: No date: ABDOMINAL HYSTERECTOMY     Comment:  partial hysterectomy 01/01/2016: BREAST BIOPSY; Bilateral     Comment:  [name] cores. Fibroadenomas 06/27/2015: COLONOSCOPY WITH PROPOFOL; N/A     Comment:  Procedure: COLONOSCOPY WITH PROPOFOL;  Surgeon: Josefine Class, MD;  Location: [facility] ENDOSCOPY;  Service:               Endoscopy;  Laterality: N/A; 09/23/2017: COLONOSCOPY WITH PROPOFOL; N/A     Comment:  Procedure: COLONOSCOPY WITH PROPOFOL;  Surgeon: Jonathon Bellows, MD;  Location: [facility] ENDOSCOPY;  Service:               Gastroenterology;  Laterality: N/A; 07/22/2017: ESOPHAGOGASTRODUODENOSCOPY (EGD) WITH PROPOFOL; N/A     Comment:  Procedure: ESOPHAGOGASTRODUODENOSCOPY (EGD) WITH               PROPOFOL;  Surgeon: Jonathon Bellows, MD;  Location: [facility]               ENDOSCOPY;  Service: Gastroenterology;  Laterality: N/A; 07/22/2016: KNEE ARTHROSCOPY; Right     Comment:  Procedure: ARTHROSCOPY KNEE debridement microfracture;                Surgeon: Leanor Kail, MD;  Location: [facility] ORS;                Service: Orthopedics;  Laterality: Right; 11/10/2017: KNEE ARTHROSCOPY WITH MEDIAL MENISECTOMY; Left     Comment:  Procedure: KNEE ARTHROSCOPY WITH PARTIAL MEDIAL               MENISECTOMY&patella femoral debridement, & ablation;                Surgeon: Jefm Bryant,  Joneen Boers, MD;  Location: [facility] ORS;                Service: Orthopedics;  Laterality: Left; No date: TUBAL LIGATION  BMI    Body Mass Index:  30.67 kg/m      Reproductive/Obstetrics negative OB ROS                             Anesthesia Physical Anesthesia Plan  ASA: III  Anesthesia Plan: General   Post-op Pain Management:    Induction: Intravenous  PONV Risk Score and Plan: Propofol infusion and TIVA  Airway Management Planned: Natural Airway and Nasal Cannula  Additional Equipment:   Intra-op Plan:   Post-operative Plan:   Informed Consent: I have reviewed the patients History and Physical, chart, labs and discussed the procedure including the risks, benefits and alternatives for the proposed anesthesia with the patient or authorized representative who has indicated his/her understanding and acceptance.   Dental  Advisory Given  Plan Discussed with: Anesthesiologist, CRNA and Surgeon  Anesthesia Plan Comments: (Patient consented for risks of anesthesia including but not limited to:  - adverse reactions to medications - risk of intubation if required - damage to teeth, lips or other oral mucosa - sore throat or hoarseness - Damage to heart, brain, lungs or loss of life  Patient voiced understanding.)        Anesthesia Quick Evaluation

## 2018-03-31 NOTE — Anesthesia Post-op Follow-up Note (Deleted)
Anesthesia QCDR form completed.        

## 2018-03-31 NOTE — H&P (Signed)
Cephas Darby, MD [address]  [place]  [place], [place] [address]  Main: [phone]  Fax: [phone] Pager: [phone]  Primary Care Physician:  [name], MD Primary Gastroenterologist:  Dr. Cephas Darby  Pre-Procedure History & Physical: HPI:  Kristina Reed is a 52 y.o. female is here for an endoscopy.   Past Medical History:  Diagnosis Date  . Anxiety   . Arthritis    right knee and right elbow  . Cancer ([place])    Cervical CA with partial hysterectomy.  . Cognitive impairment, mild, so stated   . Depression   . Diverticulosis   . Eosinophilic esophagitis 00/86/7619   Biopsy Dec 2018  . Gallstones   . GERD (gastroesophageal reflux disease)   . Hx of cervical cancer 01/30/2016  . Hypertension   . Hypothyroidism   . Sleep apnea    does not use a C-PAP  . Status post partial hysterectomy    Due to Cervical CA  . Vaginal inclusion cyst     Past Surgical History:  Procedure Laterality Date  . ABDOMINAL HYSTERECTOMY     partial hysterectomy  . BREAST BIOPSY Bilateral 01/01/2016   [name] cores. Fibroadenomas  . COLONOSCOPY WITH PROPOFOL N/A 06/27/2015   Procedure: COLONOSCOPY WITH PROPOFOL;  Surgeon: Josefine Class, MD;  Location: [facility] ENDOSCOPY;  Service: Endoscopy;  Laterality: N/A;  . COLONOSCOPY WITH PROPOFOL N/A 09/23/2017   Procedure: COLONOSCOPY WITH PROPOFOL;  Surgeon: Jonathon Bellows, MD;  Location: [facility] ENDOSCOPY;  Service: Gastroenterology;  Laterality: N/A;  . ESOPHAGOGASTRODUODENOSCOPY (EGD) WITH PROPOFOL N/A 07/22/2017   Procedure: ESOPHAGOGASTRODUODENOSCOPY (EGD) WITH PROPOFOL;  Surgeon: Jonathon Bellows, MD;  Location: [facility] ENDOSCOPY;  Service: Gastroenterology;  Laterality: N/A;  . KNEE ARTHROSCOPY Right 07/22/2016   Procedure: ARTHROSCOPY KNEE debridement microfracture;  Surgeon: Leanor Kail, MD;  Location: [facility] ORS;  Service: Orthopedics;  Laterality: Right;  . KNEE ARTHROSCOPY WITH MEDIAL MENISECTOMY Left 11/10/2017   Procedure: KNEE  ARTHROSCOPY WITH PARTIAL MEDIAL MENISECTOMY&patella femoral debridement, & ablation;  Surgeon: Leanor Kail, MD;  Location: [facility] ORS;  Service: Orthopedics;  Laterality: Left;  . TUBAL LIGATION      Prior to Admission medications   Medication Sig Start Date End Date Taking? Authorizing Provider  levothyroxine (SYNTHROID, LEVOTHROID) 75 MCG tablet Take 1 tablet (75 mcg total) by mouth daily. 02/24/18  Yes Poulose, Bethel Born, NP  omeprazole (PRILOSEC) 20 MG capsule Take 1 capsule (20 mg total) by mouth 2 (two) times daily before a meal. 03/25/18 06/23/18 Yes Sunshyne Horvath, Tally Due, MD  traZODone (DESYREL) 50 MG tablet Take by mouth.   Yes [provider]    Allergies as of 03/25/2018 - Review Complete 03/25/2018  Allergen Reaction Noted  . Percocet [oxycodone-acetaminophen] Palpitations 01/20/2015    Family History  Problem Relation Age of Onset  . Cancer Father        unknown  . Diabetes Mother   . Breast cancer Maternal Aunt   . Cancer Maternal Grandmother        cancer?  . Cancer Paternal Grandmother        cancer?  . Cancer Paternal Grandfather        unknown    Social History   Socioeconomic History  . Marital status: Legally Separated    Spouse name: Not on file  . Number of children: Not on file  . Years of education: Not on file  . Highest education level: Not on file  Occupational History  . Not on  file  Social Needs  . Financial resource strain: Not on file  . Food insecurity:    Worry: Not on file    Inability: Not on file  . Transportation needs:    Medical: Not on file    Non-medical: Not on file  Tobacco Use  . Smoking status: Never Smoker  . Smokeless tobacco: Never Used  Substance and Sexual Activity  . Alcohol use: No  . Drug use: No  . Sexual activity: Yes    Birth control/protection: Surgical  Lifestyle  . Physical activity:    Days per week: Not on file    Minutes per session: Not on file  . Stress: Not on file  Relationships    . Social connections:    Talks on phone: Not on file    Gets together: Not on file    Attends religious service: Not on file    Active member of club or organization: Not on file    Attends meetings of clubs or organizations: Not on file    Relationship status: Not on file  . Intimate partner violence:    Fear of current or ex partner: Not on file    Emotionally abused: Not on file    Physically abused: Not on file    Forced sexual activity: Not on file  Other Topics Concern  . Not on file  Social History Narrative  . Not on file    Review of Systems: See HPI, otherwise negative ROS  Physical Exam: BP (!) 130/98   Pulse 71   Temp (!) 96.8 F (36 C) (Tympanic)   Resp 20   Ht 5' 1.5" (1.562 m)   Wt 165 lb (74.8 kg)   LMP 05/09/1993 (Approximate)   SpO2 100%   BMI 30.67 kg/m  General:   Alert,  pleasant and cooperative in NAD Head:  Normocephalic and atraumatic. Neck:  Supple; no masses or thyromegaly. Lungs:  Clear throughout to auscultation.    Heart:  Regular rate and rhythm. Abdomen:  Soft, nontender and nondistended. Normal bowel sounds, without guarding, and without rebound.   Neurologic:  Alert and  oriented x4;  grossly normal neurologically.  Impression/Plan: [name] is here for an endoscopy to be performed for dysphagia, h/o EoE  Risks, benefits, limitations, and alternatives regarding  endoscopy have been reviewed with the patient.  Questions have been answered.  All parties agreeable.   Sherri Sear, MD  03/31/2018, 8:11 AM

## 2018-04-01 ENCOUNTER — Encounter: Payer: Self-pay | Admitting: Gastroenterology

## 2018-04-01 LAB — SURGICAL PATHOLOGY

## 2018-04-01 NOTE — Anesthesia Postprocedure Evaluation (Signed)
Anesthesia Post Note  Patient: Kristina Reed  Procedure(s) Performed: ESOPHAGOGASTRODUODENOSCOPY (EGD) WITH PROPOFOL (N/A )  Patient location during evaluation: Endoscopy Anesthesia Type: General Level of consciousness: awake and alert Pain management: pain level controlled Vital Signs Assessment: post-procedure vital signs reviewed and stable Respiratory status: spontaneous breathing, nonlabored ventilation, respiratory function stable and patient connected to nasal cannula oxygen Cardiovascular status: blood pressure returned to baseline and stable Postop Assessment: no apparent nausea or vomiting Anesthetic complications: no     Last Vitals:  Vitals:   03/31/18 1121 03/31/18 1131  BP: 103/88 122/86  Pulse:    Resp:    Temp:    SpO2:      Last Pain:  Vitals:   03/31/18 1131  TempSrc:   PainSc: 0-No pain                 Precious Haws Husam Hohn

## 2018-04-04 ENCOUNTER — Telehealth: Payer: Self-pay | Admitting: Gastroenterology

## 2018-04-04 NOTE — Telephone Encounter (Signed)
Patient called and is wanting her procedure results.

## 2018-04-04 NOTE — Telephone Encounter (Signed)
Patient has been notified of EGD results and verbalized understanding

## 2018-04-15 ENCOUNTER — Other Ambulatory Visit: Payer: Self-pay | Admitting: Unknown Physician Specialty

## 2018-04-15 DIAGNOSIS — M1711 Unilateral primary osteoarthritis, right knee: Secondary | ICD-10-CM

## 2018-04-15 DIAGNOSIS — M2391 Unspecified internal derangement of right knee: Secondary | ICD-10-CM

## 2018-04-29 ENCOUNTER — Ambulatory Visit
Admission: RE | Admit: 2018-04-29 | Discharge: 2018-04-29 | Disposition: A | Discharge status: Home / Self Care | Payer: Medicaid Other | Source: Ambulatory Visit | Attending: Unknown Physician Specialty | Admitting: Unknown Physician Specialty

## 2018-04-29 DIAGNOSIS — M1711 Unilateral primary osteoarthritis, right knee: Secondary | ICD-10-CM | POA: Insufficient documentation

## 2018-04-29 DIAGNOSIS — M2391 Unspecified internal derangement of right knee: Secondary | ICD-10-CM | POA: Insufficient documentation

## 2018-05-23 ENCOUNTER — Emergency Department: Payer: Medicaid Other

## 2018-05-23 ENCOUNTER — Emergency Department
Admission: EM | Admit: 2018-05-23 | Discharge: 2018-05-23 | Disposition: A | Discharge status: Home / Self Care | Payer: Medicaid Other | Attending: Emergency Medicine | Admitting: Emergency Medicine

## 2018-05-23 ENCOUNTER — Encounter: Payer: Self-pay | Admitting: Emergency Medicine

## 2018-05-23 DIAGNOSIS — E039 Hypothyroidism, unspecified: Secondary | ICD-10-CM | POA: Insufficient documentation

## 2018-05-23 DIAGNOSIS — Z79899 Other long term (current) drug therapy: Secondary | ICD-10-CM | POA: Diagnosis not present

## 2018-05-23 DIAGNOSIS — I10 Essential (primary) hypertension: Secondary | ICD-10-CM | POA: Diagnosis not present

## 2018-05-23 DIAGNOSIS — M7918 Myalgia, other site: Secondary | ICD-10-CM | POA: Diagnosis not present

## 2018-05-23 DIAGNOSIS — J40 Bronchitis, not specified as acute or chronic: Secondary | ICD-10-CM | POA: Diagnosis not present

## 2018-05-23 DIAGNOSIS — M25511 Pain in right shoulder: Secondary | ICD-10-CM | POA: Diagnosis present

## 2018-05-23 MED ORDER — KETOROLAC TROMETHAMINE 60 MG/2ML IM SOLN
60.0000 mg | Freq: Once | INTRAMUSCULAR | Status: AC
  Administered 2018-05-23: 60 mg via INTRAMUSCULAR
  Filled 2018-05-23: qty 2

## 2018-05-23 MED ORDER — PSEUDOEPH-BROMPHEN-DM 30-2-10 MG/5ML PO SYRP: 5.0000 mL | ORAL_SOLUTION | Freq: Four times a day (QID) | ORAL | 0 refills | Status: DC | PRN

## 2018-05-23 MED ORDER — ORPHENADRINE CITRATE 30 MG/ML IJ SOLN
60.0000 mg | Freq: Two times a day (BID) | INTRAMUSCULAR | Status: DC
  Administered 2018-05-23: 60 mg via INTRAMUSCULAR
  Filled 2018-05-23: qty 2

## 2018-05-23 MED ORDER — CYCLOBENZAPRINE HCL 10 MG PO TABS: 10.0000 mg | ORAL_TABLET | Freq: Three times a day (TID) | ORAL | 0 refills | Status: DC | PRN

## 2018-05-23 MED ORDER — KETOROLAC TROMETHAMINE 10 MG PO TABS: 10.0000 mg | ORAL_TABLET | Freq: Four times a day (QID) | ORAL | 0 refills | Status: DC | PRN

## 2018-05-23 NOTE — ED Notes (Addendum)
See triage note  Presents with pain to right lateral rib/axilla area this am  States pain is moving up into right shoulder area  Denies any injury

## 2018-05-23 NOTE — ED Triage Notes (Signed)
Patient presents to the ED with pain to axillary area that radiates into her shoulder.  Patient states pain began suddenly this morning.  Patient denies any injury.  Area is very tender.  Patient is tearful at this time.

## 2018-05-23 NOTE — ED Provider Notes (Signed)
[facility] Emergency Department Provider Note   ____________________________________________   First MD Initiated Contact with Patient 05/23/18 1034     (approximate)  I have reviewed the triage vital signs and the nursing notes.   HISTORY  Chief Complaint Shoulder Pain    HPI Kristina Reed is a 52 y.o. female patient complain of right lateral rib pain that radiates to her shoulder.  Patient could onset this morning with no provocative incident.  Patient state pain increased with deep inspiration and overhead reaching.  Patient rates the pain as 10/10.  Patient described the pain is "achy".  No palliative measures for complaint.   Past Medical History:  Diagnosis Date  . Anxiety   . Arthritis    right knee and right elbow  . Cancer ([place])    Cervical CA with partial hysterectomy.  . Cognitive impairment, mild, so stated   . Depression   . Diverticulosis   . Eosinophilic esophagitis 37/85/8850   Biopsy Dec 2018  . Gallstones   . GERD (gastroesophageal reflux disease)   . Hx of cervical cancer 01/30/2016  . Hypertension   . Hypothyroidism   . Sleep apnea    does not use a C-PAP  . Status post partial hysterectomy    Due to Cervical CA  . Vaginal inclusion cyst     Patient Active Problem List   Diagnosis Date Noted  . Esophageal dysphagia   . Essential hypertension 01/19/2018  . Obesity (BMI 30.0-34.9) 01/19/2018  . Preventative health care 09/03/2017  . Eosinophilic esophagitis 27/74/1287  . Apophysitis 08/26/2017  . 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 Pain in joint, multiple sites 07/05/2017  . Osteoarthritis of knee 07/05/2017  . Lichen sclerosus et atrophicus 04/27/2017  . Headache 04/27/2017  . Headache disorder 02/17/2017  . Post-concussion headache 02/17/2017  . Medication monitoring encounter 09/21/2016  . Hematuria 04/23/2016  . Hx of cervical cancer 01/30/2016  . Menopause 01/30/2016  . Status post bilateral breast  biopsy 01/07/2016  . Chronic nausea 10/25/2015  . Screening for STD (sexually transmitted disease) 10/25/2015  . Rectal bleeding 04/30/2015  . Right knee pain 07/23/2014  . Depression, major, recurrent, moderate ([place]) 02/02/2014  . Adult hypothyroidism 02/02/2014  . Obstructive apnea 02/02/2014  . Anxiety and depression 02/02/2014  . Incomplete bladder emptying 12/12/2012  . Tenosynovitis of foot 05/30/2012  . Benign neoplasm of kidney 05/13/2012  . Urge incontinence 05/13/2012  . FOM (frequency of micturition) 05/13/2012  . Chronic pain of right hand 05/05/2012  . Tarsal tunnel syndrome 03/03/2012    Past Surgical History:  Procedure Laterality Date  . ABDOMINAL HYSTERECTOMY     partial hysterectomy  . BREAST BIOPSY Bilateral 01/01/2016   [name] cores. Fibroadenomas  . COLONOSCOPY WITH PROPOFOL N/A 06/27/2015   Procedure: COLONOSCOPY WITH PROPOFOL;  Surgeon: Josefine Class, MD;  Location: [facility] ENDOSCOPY;  Service: Endoscopy;  Laterality: N/A;  . COLONOSCOPY WITH PROPOFOL N/A 09/23/2017   Procedure: COLONOSCOPY WITH PROPOFOL;  Surgeon: Jonathon Bellows, MD;  Location: [facility] ENDOSCOPY;  Service: Gastroenterology;  Laterality: N/A;  . ESOPHAGOGASTRODUODENOSCOPY (EGD) WITH PROPOFOL N/A 07/22/2017   Procedure: ESOPHAGOGASTRODUODENOSCOPY (EGD) WITH PROPOFOL;  Surgeon: Jonathon Bellows, MD;  Location: [facility] ENDOSCOPY;  Service: Gastroenterology;  Laterality: N/A;  . ESOPHAGOGASTRODUODENOSCOPY (EGD) WITH PROPOFOL N/A 03/31/2018   Procedure: ESOPHAGOGASTRODUODENOSCOPY (EGD) WITH PROPOFOL;  Surgeon: Lin Landsman, MD;  Location: [facility] ENDOSCOPY;  Service: Gastroenterology;  Laterality: N/A;  . KNEE ARTHROSCOPY Right 07/22/2016   Procedure: ARTHROSCOPY KNEE debridement microfracture;  Surgeon: Leanor Kail, MD;  Location: [facility] ORS;  Service: Orthopedics;  Laterality: Right;  . KNEE ARTHROSCOPY WITH MEDIAL MENISECTOMY Left 11/10/2017   Procedure: KNEE ARTHROSCOPY WITH PARTIAL MEDIAL  MENISECTOMY&patella femoral debridement, & ablation;  Surgeon: Leanor Kail, MD;  Location: [facility] ORS;  Service: Orthopedics;  Laterality: Left;  . TUBAL LIGATION      Prior to Admission medications   Medication Sig Start Date End Date Taking? Authorizing Provider  brompheniramine-pseudoephedrine-DM 30-2-10 MG/5ML syrup Take 5 mLs by mouth 4 (four) times daily as needed. 05/23/18   Sable Feil, PA-C  cyclobenzaprine (FLEXERIL) 10 MG tablet Take 1 tablet (10 mg total) by mouth 3 (three) times daily as needed. 05/23/18   Sable Feil, PA-C  ketorolac (TORADOL) 10 MG tablet Take 1 tablet (10 mg total) by mouth every 6 (six) hours as needed. 05/23/18   Sable Feil, PA-C  levothyroxine (SYNTHROID, LEVOTHROID) 75 MCG tablet Take 1 tablet (75 mcg total) by mouth daily. 02/24/18   Poulose, Bethel Born, NP  omeprazole (PRILOSEC) 20 MG capsule Take 1 capsule (20 mg total) by mouth 2 (two) times daily before a meal. 03/25/18 06/23/18  Lin Landsman, MD  traZODone (DESYREL) 50 MG tablet Take by mouth.    [provider]    Allergies Percocet [oxycodone-acetaminophen]  Family History  Problem Relation Age of Onset  . Cancer Father        unknown  . Diabetes Mother   . Breast cancer Maternal Aunt   . Cancer Maternal Grandmother        cancer?  . Cancer Paternal Grandmother        cancer?  . Cancer Paternal Grandfather        unknown    Social History Social History   Tobacco Use  . Smoking status: Never Smoker  . Smokeless tobacco: Never Used  Substance Use Topics  . Alcohol use: No  . Drug use: No    Review of Systems Constitutional: No fever/chills Eyes: No visual changes. ENT: No sore throat. Cardiovascular: Denies chest pain. Respiratory: Denies shortness of breath. Gastrointestinal: No abdominal pain.  No nausea, no vomiting.  No diarrhea.  No constipation. Genitourinary: Negative for dysuria. Musculoskeletal: Right rib pain. Skin: Negative for  rash. Neurological: Negative for headaches, focal weakness or numbness. Allergic/Immunilogical: Percocet ____________________________________________   PHYSICAL EXAM:  VITAL SIGNS: ED Triage Vitals  Enc Vitals Group     BP 05/23/18 0952 133/89     Pulse Rate 05/23/18 0952 75     Resp 05/23/18 0952 18     Temp 05/23/18 0952 98.3 F (36.8 C)     Temp Source 05/23/18 0952 Oral     SpO2 05/23/18 0952 98 %     Weight 05/23/18 0951 160 lb (72.6 kg)     Height 05/23/18 0951 5\' 1"  (1.549 m)     Head Circumference --      Peak Flow --      Pain Score 05/23/18 0951 10     Pain Loc --      Pain Edu? --      Excl. in [place]? --     Constitutional: Alert and oriented. Well appearing and in no acute distress. Neck:   No cervical spine tenderness to palpation. Hematological/Lymphatic/Immunilogical: No cervical lymphadenopathy. Cardiovascular: Normal rate, regular rhythm. Grossly normal heart sounds.  Good peripheral circulation. Respiratory: Normal respiratory effort.  No retractions. Lungs CTAB. Gastrointestinal: Soft and nontender. No distention. No abdominal bruits. No CVA tenderness.  Musculoskeletal: No obvious chest wall deformity.  No edema or erythema.  Moderate guarding palpation right lateral ribs 6 through 8. Neurologic:  Normal speech and language. No gross focal neurologic deficits are appreciated. No gait instability. Skin:  Skin is warm, dry and intact. No rash noted. Psychiatric: Mood and affect are normal. Speech and behavior are normal.  ____________________________________________   LABS (all labs ordered are listed, but only abnormal results are displayed)  Labs Reviewed - No data to display ____________________________________________  EKG   ____________________________________________  RADIOLOGY  ED MD interpretation:    Official radiology report(s): Dg Ribs Unilateral W/chest Right  Result Date: 05/23/2018 CLINICAL DATA:  Right rib pain. EXAM: RIGHT RIBS  AND CHEST - 3+ VIEW COMPARISON:  Chest x-ray 04/28/2015 FINDINGS: The lungs are clear without focal pneumonia, edema, pneumothorax or pleural effusion. The cardiopericardial silhouette is within normal limits for size. Oblique views of the right ribs obtained. Radio-opaque marker has been placed on the skin at the site of patient concern. No evidence for underlying right-sided rib fracture. IMPRESSION: Negative. Electronically Signed   By: Misty Stanley M.D.   On: 05/23/2018 11:15    ____________________________________________   PROCEDURES  Procedure(s) performed: None  Procedures  Critical Care performed: No  ____________________________________________   INITIAL IMPRESSION / ASSESSMENT AND PLAN / ED COURSE  As part of my medical decision making, I reviewed the following data within the electronic MEDICAL RECORD NUMBER    Right rib pain and bronchitis.  Discussed x-ray findings with patient.  Patient given discharge care instructions advised take medication as directed.  Follow-up PCP.      ____________________________________________   FINAL CLINICAL IMPRESSION(S) / ED DIAGNOSES  Final diagnoses:  Musculoskeletal pain  Bronchitis     ED Discharge Orders         Ordered    ketorolac (TORADOL) 10 MG tablet  Every 6 hours PRN     05/23/18 1144    cyclobenzaprine (FLEXERIL) 10 MG tablet  3 times daily PRN     05/23/18 1144    brompheniramine-pseudoephedrine-DM 30-2-10 MG/5ML syrup  4 times daily PRN     05/23/18 1144           Note:  This document was prepared using Dragon voice recognition software and may include unintentional dictation errors.    Sable Feil, PA-C 05/23/18 1147    Carrie Mew, MD 05/24/18 [id number]

## 2018-05-27 ENCOUNTER — Ambulatory Visit: Payer: Medicaid Other | Admitting: Gastroenterology

## 2018-05-30 ENCOUNTER — Encounter: Payer: Self-pay | Admitting: Nurse Practitioner

## 2018-05-30 ENCOUNTER — Ambulatory Visit: Payer: Medicaid Other | Admitting: Nurse Practitioner

## 2018-05-30 VITALS — BP 100/80 | HR 100 | Temp 98.2°F | Resp 16 | Ht 61.0 in | Wt 168.8 lb

## 2018-05-30 DIAGNOSIS — Z599 Problem related to housing and economic circumstances, unspecified: Secondary | ICD-10-CM

## 2018-05-30 DIAGNOSIS — M7918 Myalgia, other site: Secondary | ICD-10-CM

## 2018-05-30 DIAGNOSIS — J40 Bronchitis, not specified as acute or chronic: Secondary | ICD-10-CM | POA: Diagnosis not present

## 2018-05-30 DIAGNOSIS — Z598 Other problems related to housing and economic circumstances: Secondary | ICD-10-CM | POA: Diagnosis not present

## 2018-05-30 DIAGNOSIS — Z23 Encounter for immunization: Secondary | ICD-10-CM

## 2018-05-30 DIAGNOSIS — R05 Cough: Secondary | ICD-10-CM

## 2018-05-30 DIAGNOSIS — R059 Cough, unspecified: Secondary | ICD-10-CM

## 2018-05-30 MED ORDER — PROMETHAZINE-DM 6.25-15 MG/5ML PO SYRP: 2.5000 mL | ORAL_SOLUTION | Freq: Four times a day (QID) | ORAL | 0 refills | Status: DC | PRN

## 2018-05-30 NOTE — Patient Instructions (Addendum)
-   If you cannot afford the new cough syrup at your local pharmacy; you can use your script at [facility] or [place] with the coupon - Continue taking medicine from the ER for pain just as needed. If your pain is manageable you can take acetaminophen/tylenol instead. - Place ice on sore muscles for 15 minutes 3-4 times a day; alternate between heat packs throughout the day.

## 2018-05-30 NOTE — Progress Notes (Signed)
Name: Kristina Reed   MRN: [id number]    DOB: 1965-10-25   Date:05/30/2018       Progress Note  Subjective  Chief Complaint  Chief Complaint  Patient presents with  . Follow-up    1 week follow up from ED for bronchitis.    HPI  Patient went to ER on 05/23/18 for right rib pain raidiating to right shoulder worse with deep inspiration. Chest xray was negative for fracture or pneumonia. Diagnosed with bronchitis and musculoskeletal pain and rx Toradol, flexeril and cough medicine.  Was unable to afford cough medicine.   States is still coughing- still the same. States coughs through the day, Still having some back pain worse with coughing. States medicine from ER does help with pain.  Does not smoke.   Patient Active Problem List   Diagnosis Date Noted  . Esophageal dysphagia   . Essential hypertension 01/19/2018  . Obesity (BMI 30.0-34.9) 01/19/2018  . Preventative health care 09/03/2017  . Eosinophilic esophagitis 33/82/5053  . Apophysitis 08/26/2017  . 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 Pain in joint, multiple sites 07/05/2017  . Osteoarthritis of knee 07/05/2017  . Lichen sclerosus et atrophicus 04/27/2017  . Headache 04/27/2017  . Headache disorder 02/17/2017  . Post-concussion headache 02/17/2017  . Medication monitoring encounter 09/21/2016  . Hematuria 04/23/2016  . Hx of cervical cancer 01/30/2016  . Menopause 01/30/2016  . Status post bilateral breast biopsy 01/07/2016  . Chronic nausea 10/25/2015  . Screening for STD (sexually transmitted disease) 10/25/2015  . Rectal bleeding 04/30/2015  . Right knee pain 07/23/2014  . Depression, major, recurrent, moderate ([place]) 02/02/2014  . Adult hypothyroidism 02/02/2014  . Obstructive apnea 02/02/2014  . Anxiety and depression 02/02/2014  . Incomplete bladder emptying 12/12/2012  . Tenosynovitis of foot 05/30/2012  . Benign neoplasm of kidney 05/13/2012  . Urge incontinence 05/13/2012  . FOM (frequency of micturition)  05/13/2012  . Chronic pain of right hand 05/05/2012  . Tarsal tunnel syndrome 03/03/2012    Past Medical History:  Diagnosis Date  . Anxiety   . Arthritis    right knee and right elbow  . Cancer ([place])    Cervical CA with partial hysterectomy.  . Cognitive impairment, mild, so stated   . Depression   . Diverticulosis   . Eosinophilic esophagitis 97/67/3419   Biopsy Dec 2018  . Gallstones   . GERD (gastroesophageal reflux disease)   . Hx of cervical cancer 01/30/2016  . Hypertension   . Hypothyroidism   . Sleep apnea    does not use a C-PAP  . Status post partial hysterectomy    Due to Cervical CA  . Vaginal inclusion cyst     Past Surgical History:  Procedure Laterality Date  . ABDOMINAL HYSTERECTOMY     partial hysterectomy  . BREAST BIOPSY Bilateral 01/01/2016   [name] cores. Fibroadenomas  . COLONOSCOPY WITH PROPOFOL N/A 06/27/2015   Procedure: COLONOSCOPY WITH PROPOFOL;  Surgeon: Josefine Class, MD;  Location: [facility] ENDOSCOPY;  Service: Endoscopy;  Laterality: N/A;  . COLONOSCOPY WITH PROPOFOL N/A 09/23/2017   Procedure: COLONOSCOPY WITH PROPOFOL;  Surgeon: Jonathon Bellows, MD;  Location: [facility] ENDOSCOPY;  Service: Gastroenterology;  Laterality: N/A;  . ESOPHAGOGASTRODUODENOSCOPY (EGD) WITH PROPOFOL N/A 07/22/2017   Procedure: ESOPHAGOGASTRODUODENOSCOPY (EGD) WITH PROPOFOL;  Surgeon: Jonathon Bellows, MD;  Location: [facility] ENDOSCOPY;  Service: Gastroenterology;  Laterality: N/A;  . ESOPHAGOGASTRODUODENOSCOPY (EGD) WITH PROPOFOL N/A 03/31/2018   Procedure: ESOPHAGOGASTRODUODENOSCOPY (EGD) WITH PROPOFOL;  Surgeon: Lin Landsman, MD;  Location: [facility] ENDOSCOPY;  Service: Gastroenterology;  Laterality: N/A;  . KNEE ARTHROSCOPY Right 07/22/2016   Procedure: ARTHROSCOPY KNEE debridement microfracture;  Surgeon: Leanor Kail, MD;  Location: [facility] ORS;  Service: Orthopedics;  Laterality: Right;  . KNEE ARTHROSCOPY WITH MEDIAL MENISECTOMY Left 11/10/2017   Procedure: KNEE ARTHROSCOPY WITH  PARTIAL MEDIAL MENISECTOMY&patella femoral debridement, & ablation;  Surgeon: Leanor Kail, MD;  Location: [facility] ORS;  Service: Orthopedics;  Laterality: Left;  . TUBAL LIGATION      Social History   Tobacco Use  . Smoking status: Never Smoker  . Smokeless tobacco: Never Used  Substance Use Topics  . Alcohol use: No     Current Outpatient Medications:  .  cyclobenzaprine (FLEXERIL) 10 MG tablet, Take 1 tablet (10 mg total) by mouth 3 (three) times daily as needed., Disp: 15 tablet, Rfl: 0 .  ketorolac (TORADOL) 10 MG tablet, Take 1 tablet (10 mg total) by mouth every 6 (six) hours as needed., Disp: 20 tablet, Rfl: 0 .  levothyroxine (SYNTHROID, LEVOTHROID) 75 MCG tablet, Take 1 tablet (75 mcg total) by mouth daily., Disp: 90 tablet, Rfl: 1 .  omeprazole (PRILOSEC) 20 MG capsule, Take 1 capsule (20 mg total) by mouth 2 (two) times daily before a meal., Disp: 180 capsule, Rfl: 0 .  traZODone (DESYREL) 50 MG tablet, Take by mouth., Disp: , Rfl:  .  FLUoxetine (PROZAC) 20 MG capsule, Take 20 mg by mouth daily., Disp: , Rfl: 5  Allergies  Allergen Reactions  . Percocet [Oxycodone-Acetaminophen] Palpitations    ROS   No other specific complaints in a complete review of systems (except as listed in HPI above).  Objective  Vitals:   05/30/18 0954  BP: 100/80  Pulse: 100  Resp: 16  Temp: 98.2 F (36.8 C)  TempSrc: Oral  SpO2: 99%  Weight: 168 lb 12.8 oz (76.6 kg)  Height: 5\' 1"  (1.549 m)    Body mass index is 31.89 kg/m.  Nursing Note and Vital Signs reviewed.  Physical Exam  Constitutional: She appears well-developed and well-nourished. She is cooperative.  HENT:  Head: Normocephalic.  Right Ear: Hearing normal.  Left Ear: Hearing normal.  Nose: Nose normal.  Mouth/Throat: Oropharynx is clear and moist and mucous membranes are normal.  Cardiovascular: Normal rate, regular rhythm and normal heart sounds.  No lower extremity edema noted.  Pulmonary/Chest: Effort  normal and breath sounds normal.  Abdominal: Normal appearance.  Musculoskeletal: Normal range of motion. She exhibits tenderness (right shoulder upper back pain and tenderness). She exhibits no edema or deformity.  Neurological: She is alert.  Skin:     Psychiatric: She has a normal mood and affect. Her speech is normal and behavior is normal. Thought content normal.       No results found for this or any previous visit (from the past 48 hour(s)).  Assessment & Plan 1. Musculoskeletal pain Resolving, still tender, continue medications, utilize ice and heat and start slowly stretching.   2. Bronchitis - promethazine-dextromethorphan (PROMETHAZINE-DM) 6.25-15 MG/5ML syrup; Take 2.5 mLs by mouth 4 (four) times daily as needed for cough.  Dispense: 118 mL; Refill: 0  3. Flu vaccine need - Flu Vaccine QUAD 6+ mos PF IM (Fluarix Quad PF)  4. Financial difficulties - Ambulatory referral to [facility]  5. Cough - promethazine-dextromethorphan (PROMETHAZINE-DM) 6.25-15 MG/5ML syrup; Take 2.5 mLs by mouth 4 (four) times daily as needed for cough.  Dispense: 118 mL; Refill: 0

## 2018-06-13 ENCOUNTER — Ambulatory Visit: Payer: Medicaid Other | Admitting: Nurse Practitioner

## 2018-06-17 ENCOUNTER — Telehealth: Payer: Self-pay

## 2018-06-17 DIAGNOSIS — M170 Bilateral primary osteoarthritis of knee: Secondary | ICD-10-CM

## 2018-06-17 NOTE — Telephone Encounter (Signed)
Copied from [place] [id number]. Topic: General - Other >> Jun 17, 2018 10:47 AM Nils Flack wrote: Reason for CRM: received fax from [facility] requesting referral to Dr Holley Raring at [facility].  Referral due to DJD of bilateral knees. Please enter referral and I will be happy to process.

## 2018-06-17 NOTE — Telephone Encounter (Signed)
okay

## 2018-06-17 NOTE — Assessment & Plan Note (Signed)
Refer to pain clinic per [facility]

## 2018-07-08 ENCOUNTER — Ambulatory Visit: Payer: Medicaid Other | Admitting: Gastroenterology

## 2018-07-21 ENCOUNTER — Ambulatory Visit: Payer: Self-pay | Admitting: Student in an Organized Health Care Education/Training Program

## 2018-07-25 ENCOUNTER — Emergency Department: Payer: Medicaid Other

## 2018-07-25 ENCOUNTER — Emergency Department
Admission: EM | Admit: 2018-07-25 | Discharge: 2018-07-25 | Disposition: A | Discharge status: Home / Self Care | Payer: Medicaid Other | Attending: Emergency Medicine | Admitting: Emergency Medicine

## 2018-07-25 ENCOUNTER — Encounter: Payer: Self-pay | Admitting: Emergency Medicine

## 2018-07-25 DIAGNOSIS — M79605 Pain in left leg: Secondary | ICD-10-CM | POA: Insufficient documentation

## 2018-07-25 DIAGNOSIS — E039 Hypothyroidism, unspecified: Secondary | ICD-10-CM | POA: Insufficient documentation

## 2018-07-25 DIAGNOSIS — Z8541 Personal history of malignant neoplasm of cervix uteri: Secondary | ICD-10-CM | POA: Insufficient documentation

## 2018-07-25 DIAGNOSIS — W01198A Fall on same level from slipping, tripping and stumbling with subsequent striking against other object, initial encounter: Secondary | ICD-10-CM | POA: Insufficient documentation

## 2018-07-25 DIAGNOSIS — Z79899 Other long term (current) drug therapy: Secondary | ICD-10-CM | POA: Diagnosis not present

## 2018-07-25 DIAGNOSIS — I10 Essential (primary) hypertension: Secondary | ICD-10-CM | POA: Insufficient documentation

## 2018-07-25 MED ORDER — IBUPROFEN 600 MG PO TABS
600.0000 mg | ORAL_TABLET | Freq: Once | ORAL | Status: AC
  Administered 2018-07-25: 600 mg via ORAL
  Filled 2018-07-25: qty 1

## 2018-07-25 MED ORDER — IBUPROFEN 600 MG PO TABS: 600.0000 mg | ORAL_TABLET | Freq: Three times a day (TID) | ORAL | 0 refills | Status: DC | PRN

## 2018-07-25 MED ORDER — TRAMADOL HCL 50 MG PO TABS: 50.0000 mg | ORAL_TABLET | Freq: Two times a day (BID) | ORAL | 0 refills | Status: DC | PRN

## 2018-07-25 MED ORDER — TRAMADOL HCL 50 MG PO TABS
50.0000 mg | ORAL_TABLET | Freq: Once | ORAL | Status: AC
Start: 2018-07-25 — End: 2018-07-25
  Administered 2018-07-25: 50 mg via ORAL
  Filled 2018-07-25: qty 1

## 2018-07-25 NOTE — Discharge Instructions (Addendum)
Your x-ray was negative for fracture or other acute findings.  Follow discharge care instructions but use heat instead of ice.  Take medication as needed for pain.  Follow-up with PCP in 1 week if complaints persist.

## 2018-07-25 NOTE — ED Provider Notes (Signed)
[facility] Emergency Department Provider Note   ____________________________________________   First MD Initiated Contact with Patient 07/25/18 1418     (approximate)  I have reviewed the triage vital signs and the nursing notes.   HISTORY  Chief Complaint Leg Pain    HPI Kristina Reed is a 52 y.o. female patient complain of pain and bruising to left lateral throat secondary to fall 2 days ago.  Patient that she tripped and fell and landed against a vented.  Patient denies LOC or head injury.  Patient denies taking blood thinners.  Patient states she can ambulate with pain.  Patient rates pain as a 10/10.  Patient described the pain is "ache".  No palliative measure for complaint.  Past Medical History:  Diagnosis Date  . Anxiety   . Arthritis    right knee and right elbow  . Cancer ([place])    Cervical CA with partial hysterectomy.  . Cognitive impairment, mild, so stated   . Depression   . Diverticulosis   . Eosinophilic esophagitis 30/86/5784   Biopsy Dec 2018  . Gallstones   . GERD (gastroesophageal reflux disease)   . Hx of cervical cancer 01/30/2016  . Hypertension   . Hypothyroidism   . Sleep apnea    does not use a C-PAP  . Status post partial hysterectomy    Due to Cervical CA  . Vaginal inclusion cyst     Patient Active Problem List   Diagnosis Date Noted  . Esophageal dysphagia   . Essential hypertension 01/19/2018  . Obesity (BMI 30.0-34.9) 01/19/2018  . Preventative health care 09/03/2017  . Eosinophilic esophagitis 69/62/9528  . Apophysitis 08/26/2017  . 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 Pain in joint, multiple sites 07/05/2017  . Osteoarthritis of knee 07/05/2017  . Lichen sclerosus et atrophicus 04/27/2017  . Headache 04/27/2017  . Headache disorder 02/17/2017  . Post-concussion headache 02/17/2017  . Medication monitoring encounter 09/21/2016  . Hematuria 04/23/2016  . Hx of cervical cancer 01/30/2016  .  Menopause 01/30/2016  . Status post bilateral breast biopsy 01/07/2016  . Chronic nausea 10/25/2015  . Screening for STD (sexually transmitted disease) 10/25/2015  . Rectal bleeding 04/30/2015  . Right knee pain 07/23/2014  . Depression, major, recurrent, moderate ([place]) 02/02/2014  . Adult hypothyroidism 02/02/2014  . Obstructive apnea 02/02/2014  . Anxiety and depression 02/02/2014  . Incomplete bladder emptying 12/12/2012  . Tenosynovitis of foot 05/30/2012  . Benign neoplasm of kidney 05/13/2012  . Urge incontinence 05/13/2012  . FOM (frequency of micturition) 05/13/2012  . Chronic pain of right hand 05/05/2012  . Tarsal tunnel syndrome 03/03/2012    Past Surgical History:  Procedure Laterality Date  . ABDOMINAL HYSTERECTOMY     partial hysterectomy  . BREAST BIOPSY Bilateral 01/01/2016   [name] cores. Fibroadenomas  . COLONOSCOPY WITH PROPOFOL N/A 06/27/2015   Procedure: COLONOSCOPY WITH PROPOFOL;  Surgeon: Josefine Class, MD;  [facility]: [facility] ENDOSCOPY;  Service: Endoscopy;  Laterality: N/A;  . COLONOSCOPY WITH PROPOFOL N/A 09/23/2017   Procedure: COLONOSCOPY WITH PROPOFOL;  Surgeon: Jonathon Bellows, MD;  Location: [facility] ENDOSCOPY;  Service: Gastroenterology;  Laterality: N/A;  . ESOPHAGOGASTRODUODENOSCOPY (EGD) WITH PROPOFOL N/A 07/22/2017   Procedure: ESOPHAGOGASTRODUODENOSCOPY (EGD) WITH PROPOFOL;  Surgeon: Jonathon Bellows, MD;  Location: [facility] ENDOSCOPY;  Service: Gastroenterology;  Laterality: N/A;  . ESOPHAGOGASTRODUODENOSCOPY (EGD) WITH PROPOFOL N/A 03/31/2018   Procedure: ESOPHAGOGASTRODUODENOSCOPY (EGD) WITH PROPOFOL;  Surgeon: Lin Landsman, MD;  Location: [facility] ENDOSCOPY;  Service: Gastroenterology;  Laterality:  N/A;  . KNEE ARTHROSCOPY Right 07/22/2016   Procedure: ARTHROSCOPY KNEE debridement microfracture;  Surgeon: Leanor Kail, MD;  Location: [facility] ORS;  Service: Orthopedics;  Laterality: Right;  . KNEE ARTHROSCOPY WITH MEDIAL MENISECTOMY Left 11/10/2017   Procedure:  KNEE ARTHROSCOPY WITH PARTIAL MEDIAL MENISECTOMY&patella femoral debridement, & ablation;  Surgeon: Leanor Kail, MD;  Location: [facility] ORS;  Service: Orthopedics;  Laterality: Left;  . TUBAL LIGATION      Prior to Admission medications   Medication Sig Start Date End Date Taking? Authorizing Provider  cyclobenzaprine (FLEXERIL) 10 MG tablet Take 1 tablet (10 mg total) by mouth 3 (three) times daily as needed. 05/23/18   Sable Feil, PA-C  FLUoxetine (PROZAC) 20 MG capsule Take 20 mg by mouth daily. 05/13/18   [provider]  ibuprofen (ADVIL,MOTRIN) 600 MG tablet Take 1 tablet (600 mg total) by mouth every 8 (eight) hours as needed. 07/25/18   Sable Feil, PA-C  ketorolac (TORADOL) 10 MG tablet Take 1 tablet (10 mg total) by mouth every 6 (six) hours as needed. 05/23/18   Sable Feil, PA-C  levothyroxine (SYNTHROID, LEVOTHROID) 75 MCG tablet Take 1 tablet (75 mcg total) by mouth daily. 02/24/18   Poulose, Bethel Born, NP  omeprazole (PRILOSEC) 20 MG capsule Take 1 capsule (20 mg total) by mouth 2 (two) times daily before a meal. 03/25/18 06/23/18  Lin Landsman, MD  promethazine-dextromethorphan (PROMETHAZINE-DM) 6.25-15 MG/5ML syrup Take 2.5 mLs by mouth 4 (four) times daily as needed for cough. 05/30/18   Poulose, Bethel Born, NP  traMADol (ULTRAM) 50 MG tablet Take 1 tablet (50 mg total) by mouth every 12 (twelve) hours as needed. 07/25/18   Sable Feil, PA-C  traZODone (DESYREL) 50 MG tablet Take by mouth.    [provider]    Allergies Percocet [oxycodone-acetaminophen]  Family History  Problem Relation Age of Onset  . Cancer Father        unknown  . Diabetes Mother   . Breast cancer Maternal Aunt   . Cancer Maternal Grandmother        cancer?  . Cancer Paternal Grandmother        cancer?  . Cancer Paternal Grandfather        unknown    Social History Social History   Tobacco Use  . Smoking status: Never Smoker  . Smokeless tobacco:  Never Used  Substance Use Topics  . Alcohol use: No  . Drug use: No    Review of Systems Constitutional: No fever/chills Eyes: No visual changes. ENT: No sore throat. Cardiovascular: Denies chest pain. Respiratory: Denies shortness of breath. Gastrointestinal: No abdominal pain.  No nausea, no vomiting.  No diarrhea.  No constipation. Genitourinary: Negative for dysuria. Musculoskeletal: Left thigh pain. Skin: Negative for rash. Neurological: Negative for headaches, focal weakness or numbness. Psychiatric:Anxiety and depression. Endocrine:Hypertension hypothyroidism. Allergic/Immunilogical: Percocets.  ____________________________________________   PHYSICAL EXAM:  VITAL SIGNS: ED Triage Vitals [07/25/18 1404]  Enc Vitals Group     BP (!) 139/91     Pulse Rate 72     Resp 14     Temp 97.9 F (36.6 C)     Temp Source Oral     SpO2 100 %     Weight 171 lb (77.6 kg)     Height 5\' 1"  (1.549 m)     Head Circumference      Peak Flow      Pain Score 10     Pain Loc  Pain Edu?      Excl. in [place]?    Constitutional: Alert and oriented. Well appearing and in no acute distress. Neck: No stridor.  Hematological/Lymphatic/Immunilogical: No cervical lymphadenopathy. Cardiovascular: Normal rate, regular rhythm. Grossly normal heart sounds.  Good peripheral circulation. Respiratory: Normal respiratory effort.  No retractions. Lungs CTAB. Musculoskeletal: No lower extremity tenderness nor edema.  No joint effusions. Neurologic:  Normal speech and language. No gross focal neurologic deficits are appreciated. No gait instability. Skin:  Skin is warm, dry and intact. No rash noted.  Ecchymosis distal lateral left thigh. Psychiatric: Mood and affect are normal. Speech and behavior are normal.  ____________________________________________   LABS (all labs ordered are listed, but only abnormal results are displayed)  Labs Reviewed - No data to  display ____________________________________________  EKG   ____________________________________________  RADIOLOGY  ED MD interpretation:    Official radiology report(s): Dg Femur Min 2 Views Left  Result Date: 07/25/2018 CLINICAL DATA:  Left femur pain after falling 2 days ago. EXAM: LEFT FEMUR 2 VIEWS COMPARISON:  Left knee radiographs 12/08/2017. FINDINGS: The mineralization and alignment are normal. There is no evidence of acute fracture or dislocation. There is stable fragmentation of the tibial tubercle. No significant knee joint effusion or arthropathy at the hip or knee. IMPRESSION: No acute osseous findings. Stable fragmentation of the tibial tubercle. Electronically Signed   By: Richardean Sale M.D.   On: 07/25/2018 15:05    ____________________________________________   PROCEDURES  Procedure(s) performed: None  Procedures  Critical Care performed: No  ____________________________________________   INITIAL IMPRESSION / ASSESSMENT AND PLAN / ED COURSE  As part of my medical decision making, I reviewed the following data within the [place]    Patient presents with pain to the left thigh secondary to fall.  Discussed negative x-ray findings with patient.  Patient given discharge care instruction advised take medication as needed for pain.  Patient advised follow-up PCP in 1 week if complaints persist.      ____________________________________________   FINAL CLINICAL IMPRESSION(S) / ED DIAGNOSES  Final diagnoses:  Left leg pain     ED Discharge Orders         Ordered    traMADol (ULTRAM) 50 MG tablet  Every 12 hours PRN     07/25/18 1520    ibuprofen (ADVIL,MOTRIN) 600 MG tablet  Every 8 hours PRN     07/25/18 1520           Note:  This document was prepared using Dragon voice recognition software and may include unintentional dictation errors.    Sable Feil, PA-C 07/25/18 1525    Earleen Newport,  MD 07/26/18 [phone]

## 2018-07-25 NOTE — ED Notes (Signed)
See triage note  States she fell in a vent couple of days ago  Having to left upper leg  Large bruised area noted

## 2018-07-25 NOTE — ED Triage Notes (Signed)
Patient presents to ED via POV from home with c/o left leg pain. Patient states she tripped and fell at home on Saturday, and landed on her left leg. Denies LOC, hitting head or taking blood thinners. Ambulatory on arrival.

## 2018-08-15 ENCOUNTER — Ambulatory Visit: Payer: Medicaid Other | Admitting: Nurse Practitioner

## 2018-08-18 ENCOUNTER — Ambulatory Visit: Payer: Self-pay | Admitting: Student in an Organized Health Care Education/Training Program

## 2018-08-23 ENCOUNTER — Ambulatory Visit: Payer: Medicaid Other | Admitting: Gastroenterology

## 2018-08-26 ENCOUNTER — Ambulatory Visit: Payer: Medicaid Other | Admitting: Nurse Practitioner

## 2018-08-31 ENCOUNTER — Encounter: Payer: Self-pay | Admitting: Family Medicine

## 2018-08-31 ENCOUNTER — Ambulatory Visit (INDEPENDENT_AMBULATORY_CARE_PROVIDER_SITE_OTHER): Payer: Medicaid Other | Admitting: Family Medicine

## 2018-08-31 ENCOUNTER — Emergency Department
Admission: EM | Admit: 2018-08-31 | Discharge: 2018-08-31 | Disposition: A | Discharge status: Home / Self Care | Payer: Medicaid Other | Attending: Emergency Medicine | Admitting: Emergency Medicine

## 2018-08-31 ENCOUNTER — Encounter: Payer: Self-pay | Admitting: Emergency Medicine

## 2018-08-31 VITALS — BP 118/70 | HR 91 | Temp 97.7°F | Ht 61.5 in | Wt 168.7 lb

## 2018-08-31 DIAGNOSIS — Z79899 Other long term (current) drug therapy: Secondary | ICD-10-CM | POA: Insufficient documentation

## 2018-08-31 DIAGNOSIS — Z8541 Personal history of malignant neoplasm of cervix uteri: Secondary | ICD-10-CM | POA: Insufficient documentation

## 2018-08-31 DIAGNOSIS — R1030 Lower abdominal pain, unspecified: Secondary | ICD-10-CM | POA: Insufficient documentation

## 2018-08-31 DIAGNOSIS — R140 Abdominal distension (gaseous): Secondary | ICD-10-CM | POA: Diagnosis not present

## 2018-08-31 DIAGNOSIS — R34 Anuria and oliguria: Secondary | ICD-10-CM | POA: Diagnosis not present

## 2018-08-31 DIAGNOSIS — R339 Retention of urine, unspecified: Secondary | ICD-10-CM | POA: Diagnosis not present

## 2018-08-31 DIAGNOSIS — I10 Essential (primary) hypertension: Secondary | ICD-10-CM | POA: Insufficient documentation

## 2018-08-31 DIAGNOSIS — E039 Hypothyroidism, unspecified: Secondary | ICD-10-CM | POA: Diagnosis not present

## 2018-08-31 LAB — CBC WITH DIFFERENTIAL/PLATELET
ABS IMMATURE GRANULOCYTES: 0.02 10*3/uL (ref 0.00–0.07)
BASOS ABS: 0.1 10*3/uL (ref 0.0–0.1)
BASOS PCT: 1 %
Eosinophils Absolute: 0.6 10*3/uL — ABNORMAL HIGH (ref 0.0–0.5)
Eosinophils Relative: 9 %
HEMATOCRIT: 38.9 % (ref 36.0–46.0)
Hemoglobin: 12.7 g/dL (ref 12.0–15.0)
IMMATURE GRANULOCYTES: 0 %
Lymphocytes Relative: 31 %
Lymphs Abs: 2.1 10*3/uL (ref 0.7–4.0)
MCH: 28.6 pg (ref 26.0–34.0)
MCHC: 32.6 g/dL (ref 30.0–36.0)
MCV: 87.6 fL (ref 80.0–100.0)
MONOS PCT: 6 %
Monocytes Absolute: 0.4 10*3/uL (ref 0.1–1.0)
NEUTROS ABS: 3.6 10*3/uL (ref 1.7–7.7)
NEUTROS PCT: 53 %
NRBC: 0 % (ref 0.0–0.2)
Platelets: 203 10*3/uL (ref 150–400)
RBC: 4.44 MIL/uL (ref 3.87–5.11)
RDW: 12.5 % (ref 11.5–15.5)
WBC: 6.8 10*3/uL (ref 4.0–10.5)

## 2018-08-31 LAB — URINALYSIS, COMPLETE (UACMP) WITH MICROSCOPIC
Bacteria, UA: NONE SEEN
Bilirubin Urine: NEGATIVE
GLUCOSE, UA: 50 mg/dL — AB
Hgb urine dipstick: NEGATIVE
Ketones, ur: NEGATIVE mg/dL
Leukocytes, UA: NEGATIVE
Nitrite: NEGATIVE
PH: 5 (ref 5.0–8.0)
Protein, ur: NEGATIVE mg/dL
SPECIFIC GRAVITY, URINE: 1.026 (ref 1.005–1.030)

## 2018-08-31 LAB — BASIC METABOLIC PANEL
ANION GAP: 8 (ref 5–15)
BUN: 13 mg/dL (ref 6–20)
CO2: 27 mmol/L (ref 22–32)
Calcium: 9 mg/dL (ref 8.9–10.3)
Chloride: 102 mmol/L (ref 98–111)
Creatinine, Ser: 0.81 mg/dL (ref 0.44–1.00)
GFR calc non Af Amer: >60 mL/min (ref >60)
Glucose, Bld: 191 mg/dL — ABNORMAL HIGH (ref 70–99)
POTASSIUM: 3.5 mmol/L (ref 3.5–5.1)
Sodium: 137 mmol/L (ref 135–145)

## 2018-08-31 NOTE — ED Notes (Signed)
Pts family called back to ED to talk charge nurse and report that pts nurse left the IV in place upon discharge. Charge nurse apologized and talked pts through options. Pt denies wanting to come back to ED and does not want to call the ambulance. Pts boyfriend insists RN walk him through how to take the IV out. RN talked pts boyfriend through and stayed on the phone until pt and family verified the bleeding was under control.

## 2018-08-31 NOTE — ED Notes (Signed)
PT reports passing no urine in 2 days.  Pt states her bladder is not full but that there is "something in there stuck that will not let it come out."  States this has happened before.  Pt denies seeing urologist for this condition.

## 2018-08-31 NOTE — Discharge Instructions (Addendum)
Call to make an appointment to follow-up with the specialist within the next week.  You should keep the catheter in until you follow-up.  However it is very important that you do follow-up soon and do not leave the catheter in for more than 1 or 2 weeks because this can lead to an infection.  Return to the ER for urine not coming out of the catheter, pain, fever, weakness, or any other new or worsening symptoms that concern you.

## 2018-08-31 NOTE — ED Provider Notes (Signed)
[facility] Emergency Department Provider Note ____________________________________________   First MD Initiated Contact with Patient 08/31/18 1555     (approximate)  I have reviewed the triage vital signs and the nursing notes.   HISTORY  Chief Complaint Urinary Retention    HPI Kristina Reed is a 52 y.o. female with PMH as noted below who presents with urinary retention, gradual onset over the last 2 days, and associated with some suprapubic pressure.  However, the patient states that she also does not feel a strong urge to urinate.  She denies feeling weak or lightheaded, and denies fever chills, nausea, vomiting, change in her bowel movements, dysuria, or hematuria.  She reports a prior history of urinary retention requiring a catheter.   Past Medical History:  Diagnosis Date  . Anxiety   . Arthritis    right knee and right elbow  . Cancer ([place])    Cervical CA with partial hysterectomy.  . Cognitive impairment, mild, so stated   . Depression   . Diverticulosis   . Eosinophilic esophagitis 37/16/9678   Biopsy Dec 2018  . Gallstones   . GERD (gastroesophageal reflux disease)   . Hx of cervical cancer 01/30/2016  . Hypertension   . Hypothyroidism   . Sleep apnea    does not use a C-PAP  . Status post partial hysterectomy    Due to Cervical CA  . Vaginal inclusion cyst     Patient Active Problem List   Diagnosis Date Noted  . Esophageal dysphagia   . Essential hypertension 01/19/2018  . Obesity (BMI 30.0-34.9) 01/19/2018  . Preventative health care 09/03/2017  . Eosinophilic esophagitis 93/81/0175  . Apophysitis 08/26/2017  . 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 Pain in joint, multiple sites 07/05/2017  . Osteoarthritis of knee 07/05/2017  . Lichen sclerosus et atrophicus 04/27/2017  . Headache 04/27/2017  . Headache disorder 02/17/2017  . Post-concussion headache 02/17/2017  . Medication monitoring encounter 09/21/2016  . Hematuria  04/23/2016  . Hx of cervical cancer 01/30/2016  . Menopause 01/30/2016  . Status post bilateral breast biopsy 01/07/2016  . Chronic nausea 10/25/2015  . Screening for STD (sexually transmitted disease) 10/25/2015  . Rectal bleeding 04/30/2015  . Right knee pain 07/23/2014  . Depression, major, recurrent, moderate ([place]) 02/02/2014  . Adult hypothyroidism 02/02/2014  . Obstructive apnea 02/02/2014  . Anxiety and depression 02/02/2014  . Incomplete bladder emptying 12/12/2012  . Tenosynovitis of foot 05/30/2012  . Benign neoplasm of kidney 05/13/2012  . Urge incontinence 05/13/2012  . FOM (frequency of micturition) 05/13/2012  . Chronic pain of right hand 05/05/2012  . Tarsal tunnel syndrome 03/03/2012    Past Surgical History:  Procedure Laterality Date  . ABDOMINAL HYSTERECTOMY     partial hysterectomy  . BREAST BIOPSY Bilateral 01/01/2016   [name] cores. Fibroadenomas  . COLONOSCOPY WITH PROPOFOL N/A 06/27/2015   Procedure: COLONOSCOPY WITH PROPOFOL;  Surgeon: Josefine Class, MD;  Location: [facility] ENDOSCOPY;  Service: Endoscopy;  Laterality: N/A;  . COLONOSCOPY WITH PROPOFOL N/A 09/23/2017   Procedure: COLONOSCOPY WITH PROPOFOL;  Surgeon: Jonathon Bellows, MD;  Location: [facility] ENDOSCOPY;  Service: Gastroenterology;  Laterality: N/A;  . ESOPHAGOGASTRODUODENOSCOPY (EGD) WITH PROPOFOL N/A 07/22/2017   Procedure: ESOPHAGOGASTRODUODENOSCOPY (EGD) WITH PROPOFOL;  Surgeon: Jonathon Bellows, MD;  Location: [facility] ENDOSCOPY;  Service: Gastroenterology;  Laterality: N/A;  . ESOPHAGOGASTRODUODENOSCOPY (EGD) WITH PROPOFOL N/A 03/31/2018   Procedure: ESOPHAGOGASTRODUODENOSCOPY (EGD) WITH PROPOFOL;  Surgeon: Lin Landsman, MD;  Location: [facility] ENDOSCOPY;  Service: Gastroenterology;  Laterality: N/A;  . KNEE ARTHROSCOPY Right 07/22/2016   Procedure: ARTHROSCOPY KNEE debridement microfracture;  Surgeon: Leanor Kail, MD;  Location: [facility] ORS;  Service: Orthopedics;  Laterality: Right;  . KNEE ARTHROSCOPY  WITH MEDIAL MENISECTOMY Left 11/10/2017   Procedure: KNEE ARTHROSCOPY WITH PARTIAL MEDIAL MENISECTOMY&patella femoral debridement, & ablation;  Surgeon: Leanor Kail, MD;  Location: [facility] ORS;  Service: Orthopedics;  Laterality: Left;  . TUBAL LIGATION      Prior to Admission medications   Medication Sig Start Date End Date Taking? Authorizing Provider  cyclobenzaprine (FLEXERIL) 10 MG tablet Take 1 tablet (10 mg total) by mouth 3 (three) times daily as needed. 05/23/18  Yes Sable Feil, PA-C  FLUoxetine (PROZAC) 20 MG capsule Take 20 mg by mouth daily. 05/13/18  Yes [provider]  ibuprofen (ADVIL,MOTRIN) 600 MG tablet Take 1 tablet (600 mg total) by mouth every 8 (eight) hours as needed. 07/25/18  Yes Sable Feil, PA-C  levothyroxine (SYNTHROID, LEVOTHROID) 75 MCG tablet Take 1 tablet (75 mcg total) by mouth daily. 02/24/18  Yes Poulose, Bethel Born, NP  traMADol (ULTRAM) 50 MG tablet Take 1 tablet (50 mg total) by mouth every 12 (twelve) hours as needed. 07/25/18  Yes Sable Feil, PA-C  traZODone (DESYREL) 50 MG tablet Take by mouth.   Yes [provider]    Allergies Percocet [oxycodone-acetaminophen]  Family History  Problem Relation Age of Onset  . Cancer Father        unknown  . Diabetes Mother   . Breast cancer Maternal Aunt   . Cancer Maternal Grandmother        cancer?  . Cancer Paternal Grandmother        cancer?  . Cancer Paternal Grandfather        unknown    Social History Social History   Tobacco Use  . Smoking status: Never Smoker  . Smokeless tobacco: Never Used  Substance Use Topics  . Alcohol use: No  . Drug use: No    Review of Systems  Constitutional: No fever. Eyes: No redness. ENT: No sore throat. Cardiovascular: Denies chest pain. Respiratory: Denies shortness of breath. Gastrointestinal: No vomiting or diarrhea.  Genitourinary: Negative for dysuria.  Positive for urinary retention. Musculoskeletal: Negative for  back pain. Skin: Negative for rash. Neurological: Negative for headache.  ____________________________________________   PHYSICAL EXAM:  VITAL SIGNS: ED Triage Vitals [08/31/18 1222]  Enc Vitals Group     BP 134/90     Pulse Rate 74     Resp 18     Temp 98.3 F (36.8 C)     Temp Source Oral     SpO2 99 %     Weight 168 lb (76.2 kg)     Height 5\' 1"  (1.549 m)     Head Circumference      Peak Flow      Pain Score 0     Pain Loc      Pain Edu?      Excl. in [place]?     Constitutional: Alert and oriented.  Relatively well appearing and in no acute distress. Eyes: Conjunctivae are normal.  Head: Atraumatic. Nose: No congestion/rhinnorhea. Mouth/Throat: Mucous membranes are moist.   Neck: Normal range of motion.  Cardiovascular: Good peripheral circulation. Respiratory: Normal respiratory effort.   Gastrointestinal: Soft and nontender. No distention.  Genitourinary: No CVA tenderness. Musculoskeletal: Extremities warm and well perfused.  Neurologic:  Normal speech and language. No gross focal neurologic deficits are appreciated.  Skin:  Skin is warm and dry. No rash noted. Psychiatric: Mood and affect are normal. Speech and behavior are normal.  ____________________________________________   LABS (all labs ordered are listed, but only abnormal results are displayed)  Labs Reviewed  URINALYSIS, COMPLETE (UACMP) WITH MICROSCOPIC - Abnormal; Notable for the following components:      Result Value   Color, Urine YELLOW (*)    APPearance CLEAR (*)    Glucose, UA 50 (*)    All other components within normal limits  BASIC METABOLIC PANEL - Abnormal; Notable for the following components:   Glucose, Bld 191 (*)    All other components within normal limits  CBC WITH DIFFERENTIAL/PLATELET - Abnormal; Notable for the following components:   Eosinophils Absolute 0.6 (*)    All other components within normal limits    ____________________________________________  EKG   ____________________________________________  RADIOLOGY    ____________________________________________   PROCEDURES  Procedure(s) performed: No  Procedures  Critical Care performed: No ____________________________________________   INITIAL IMPRESSION / ASSESSMENT AND PLAN / ED COURSE  Pertinent labs & imaging results that were available during my care of the patient were reviewed by me and considered in my medical decision making (see chart for details).  52 year old female with PMH as noted above presents with apparent urinary retention over the last 2 days with some suprapubic pressure, although she does not report any strong urinary urgency.  She does report a prior history of urinary retention requiring catheter placement.  On exam, the patient is well-appearing and her vital signs are normal.  She has no abdominal or CVA tenderness.  Bladder scan shows only about 100 cc of urine.  I reviewed the past medical records in Epic; the patient has had no visits for this complaint in the last year.  The source of the patient's urinary retention is unclear.  Given the fact that she does not have a large amount of urine in the bladder or significant urinary urgency, I am also concerned that she may be dehydrated and have decreased urinary output. We will place a Foley catheter, send a urinalysis, basic labs, and reassess.  ----------------------------------------- 7:19 PM on 08/31/2018 -----------------------------------------  The lab work-up is unremarkable.  UA shows no signs of UTI.  The patient remains stable.  At this time, there is no indication for further ED work-up.  She is stable for discharge home.  I counseled her on the results of the work-up.  I do not think would be prudent to remove the Foley at this time since the patient may go back into retention.  We have given her referral to see the urologist.  I  instructed her to keep the Foley in place until that time.  She understands that she needs to follow-up because if the Foley stays in too long she can have an infection.  I counseled her on return precautions and she expressed understanding of these as well. ____________________________________________   FINAL CLINICAL IMPRESSION(S) / ED DIAGNOSES  Final diagnoses:  Urinary retention      NEW MEDICATIONS STARTED DURING THIS VISIT:  New Prescriptions   No medications on file     Note:  This document was prepared using Dragon voice recognition software and may include unintentional dictation errors.    Arta Silence, MD 08/31/18 Lurena Nida

## 2018-08-31 NOTE — Progress Notes (Signed)
BP 118/70   Pulse 91   Temp 97.7 F (36.5 C) (Oral)   Ht 5' 1.5" (1.562 m)   Wt 168 lb 11.2 oz (76.5 kg)   LMP 05/09/1993 (Approximate)   SpO2 98%   BMI 31.36 kg/m    Subjective:    Patient ID: Kristina Reed, female    DOB: 05/25/1966, 52 y.o.   MRN: [id number]  HPI: Kristina Reed is a 52 y.o. female  Chief Complaint  Patient presents with  . Urinary Retention    unable to urinate patient states x2 days    HPI Patient reports that she has been completely unable to urinate at all for three days She has tried to drink liquids, but has not urinated a drop She says that her abdomen is swollen and she feels bloated  Depression screen [facility] 2/9 03/08/2018 02/23/2018 08/26/2017 04/27/2017 03/04/2017  Decreased Interest 0 0 0 0 0  Down, Depressed, Hopeless 1 1 1  0 1  PHQ - 2 Score 1 1 1  0 1  Altered sleeping 1 1 - - -  Tired, decreased energy 0 0 - - -  Change in appetite 0 0 - - -  Feeling bad or failure about yourself  0 0 - - -  Trouble concentrating 0 0 - - -  Moving slowly or fidgety/restless 0 0 - - -  Suicidal thoughts 0 0 - - -  PHQ-9 Score 2 2 - - -  Difficult doing work/chores Not difficult at all Not difficult at all - - -   Fall Risk  05/30/2018 08/26/2017 04/27/2017 03/04/2017 01/21/2017  Falls in the past year? Yes No No No No  Number falls in past yr: 1 - - - -  Injury with Fall? Yes - - - -    Relevant past medical, surgical, family and social history reviewed Past Medical History:  Diagnosis Date  . Anxiety   . Arthritis    right knee and right elbow  . Cancer ([place])    Cervical CA with partial hysterectomy.  . Cognitive impairment, mild, so stated   . Depression   . Diverticulosis   . Eosinophilic esophagitis 29/79/8921   Biopsy Dec 2018  . Gallstones   . GERD (gastroesophageal reflux disease)   . Hx of cervical cancer 01/30/2016  . Hypertension   . Hypothyroidism   . Sleep apnea    does not use a C-PAP  . Status post partial hysterectomy    Due to  Cervical CA  . Vaginal inclusion cyst    Past Surgical History:  Procedure Laterality Date  . ABDOMINAL HYSTERECTOMY     partial hysterectomy  . BREAST BIOPSY Bilateral 01/01/2016   [name] cores. Fibroadenomas  . COLONOSCOPY WITH PROPOFOL N/A 06/27/2015   Procedure: COLONOSCOPY WITH PROPOFOL;  Surgeon: Josefine Class, MD;  Location: [facility] ENDOSCOPY;  Service: Endoscopy;  Laterality: N/A;  . COLONOSCOPY WITH PROPOFOL N/A 09/23/2017   Procedure: COLONOSCOPY WITH PROPOFOL;  Surgeon: Jonathon Bellows, MD;  Location: [facility] ENDOSCOPY;  Service: Gastroenterology;  Laterality: N/A;  . ESOPHAGOGASTRODUODENOSCOPY (EGD) WITH PROPOFOL N/A 07/22/2017   Procedure: ESOPHAGOGASTRODUODENOSCOPY (EGD) WITH PROPOFOL;  Surgeon: Jonathon Bellows, MD;  Location[facility] ENDOSCOPY;  Service: Gastroenterology;  Laterality: N/A;  . ESOPHAGOGASTRODUODENOSCOPY (EGD) WITH PROPOFOL N/A 03/31/2018   Procedure: ESOPHAGOGASTRODUODENOSCOPY (EGD) WITH PROPOFOL;  Surgeon: Lin Landsman, MD;  Location: [facility] ENDOSCOPY;  Service: Gastroenterology;  Laterality: N/A;  . KNEE ARTHROSCOPY Right 07/22/2016   Procedure: ARTHROSCOPY KNEE debridement microfracture;  Surgeon:  Leanor Kail, MD;  Location: [facility] ORS;  Service: Orthopedics;  Laterality: Right;  . KNEE ARTHROSCOPY WITH MEDIAL MENISECTOMY Left 11/10/2017   Procedure: KNEE ARTHROSCOPY WITH PARTIAL MEDIAL MENISECTOMY&patella femoral debridement, & ablation;  Surgeon: Leanor Kail, MD;  Location: [facility] ORS;  Service: Orthopedics;  Laterality: Left;  . TUBAL LIGATION     Family History  Problem Relation Age of Onset  . Cancer Father        unknown  . Diabetes Mother   . Breast cancer Maternal Aunt   . Cancer Maternal Grandmother        cancer?  . Cancer Paternal Grandmother        cancer?  . Cancer Paternal Grandfather        unknown   Social History   Tobacco Use  . Smoking status: Never Smoker  . Smokeless tobacco: Never Used  Substance Use Topics  . Alcohol use: No  .  Drug use: No     Interim medical history since last visit reviewed. Allergies and medications reviewed  Review of Systems Per HPI unless specifically indicated above     Objective:    BP 118/70   Pulse 91   Temp 97.7 F (36.5 C) (Oral)   Ht 5' 1.5" (1.562 m)   Wt 168 lb 11.2 oz (76.5 kg)   LMP 05/09/1993 (Approximate)   SpO2 98%   BMI 31.36 kg/m   Wt Readings from Last 3 Encounters:  08/31/18 168 lb (76.2 kg)  08/31/18 168 lb 11.2 oz (76.5 kg)  07/25/18 171 lb (77.6 kg)    Physical Exam Constitutional:      General: She is not in acute distress.    Appearance: She is well-developed.  Eyes:     General: No scleral icterus. Neck:     Thyroid: No thyromegaly.  Cardiovascular:     Rate and Rhythm: Normal rate.  Pulmonary:     Effort: Pulmonary effort is normal.  Abdominal:     General: Abdomen is protuberant.  Skin:    Coloration: Skin is not pale.  Psychiatric:        Mood and Affect: Mood is anxious. Affect is tearful.        Assessment & Plan:   Problem List Items Addressed This Visit    None    Visit Diagnoses    Anuria    -  Primary   patient reports absolutely no urine output for three days (today is day 3) despite hydration; I am sending her straight to the ER for evaluation   Abdominal bloating       with lack of reported UOP x 3 days; sending patient to ER now       Follow up plan: No follow-ups on file.  An after-visit summary was printed and given to the patient at [facility].  Please see the patient instructions which may contain other information and recommendations beyond what is mentioned above in the assessment and plan.  No orders of the defined types were placed in this encounter.   No orders of the defined types were placed in this encounter.

## 2018-08-31 NOTE — ED Notes (Signed)
Bladder scan performed. 182mL of urine retained.

## 2018-08-31 NOTE — ED Triage Notes (Signed)
Patient presents to ED via POV from home due to urinary retention. History of same. Patient reports last time she voided was Monday. Patient reports "they normally have to put a catheter in". Denies pain. No abdominal distention noted.

## 2018-09-01 ENCOUNTER — Other Ambulatory Visit: Payer: Self-pay

## 2018-09-01 ENCOUNTER — Emergency Department
Admission: EM | Admit: 2018-09-01 | Discharge: 2018-09-01 | Disposition: A | Discharge status: Home / Self Care | Payer: Medicaid Other | Attending: Emergency Medicine | Admitting: Emergency Medicine

## 2018-09-01 ENCOUNTER — Encounter: Payer: Self-pay | Admitting: Emergency Medicine

## 2018-09-01 DIAGNOSIS — E039 Hypothyroidism, unspecified: Secondary | ICD-10-CM | POA: Insufficient documentation

## 2018-09-01 DIAGNOSIS — Z85528 Personal history of other malignant neoplasm of kidney: Secondary | ICD-10-CM | POA: Insufficient documentation

## 2018-09-01 DIAGNOSIS — Z79899 Other long term (current) drug therapy: Secondary | ICD-10-CM | POA: Diagnosis not present

## 2018-09-01 DIAGNOSIS — Z8541 Personal history of malignant neoplasm of cervix uteri: Secondary | ICD-10-CM | POA: Diagnosis not present

## 2018-09-01 DIAGNOSIS — Y69 Unspecified misadventure during surgical and medical care: Secondary | ICD-10-CM | POA: Diagnosis not present

## 2018-09-01 DIAGNOSIS — I10 Essential (primary) hypertension: Secondary | ICD-10-CM | POA: Insufficient documentation

## 2018-09-01 DIAGNOSIS — T83011A Breakdown (mechanical) of indwelling urethral catheter, initial encounter: Secondary | ICD-10-CM | POA: Diagnosis present

## 2018-09-01 NOTE — ED Provider Notes (Signed)
[facility] Emergency Department Provider Note  ____________________________________________   First MD Initiated Contact with Patient 09/01/18 [phone]     (approximate)  I have reviewed the triage vital signs and the nursing notes.   HISTORY  Chief Complaint Catheter Leaking   HPI Kristina Reed is a 52 y.o. female with a history of urinary retention was presented emergency department with a Foley catheter malfunction.  She states that there is urine leaking from around the Foley catheter insertion site into the urethra.  She also reports a mild amount of blood in the urine.  However, says the bag is also continuing to have urine drain into it and that she emptied the bag just prior to arrival to the emergency department.  She is denying any pain at this time.  Foley was inserted yesterday.   Past Medical History:  Diagnosis Date  . Anxiety   . Arthritis    right knee and right elbow  . Cancer ([place])    Cervical CA with partial hysterectomy.  . Cognitive impairment, mild, so stated   . Depression   . Diverticulosis   . Eosinophilic esophagitis 16/03/3709   Biopsy Dec 2018  . Gallstones   . GERD (gastroesophageal reflux disease)   . Hx of cervical cancer 01/30/2016  . Hypertension   . Hypothyroidism   . Sleep apnea    does not use a C-PAP  . Status post partial hysterectomy    Due to Cervical CA  . Vaginal inclusion cyst     Patient Active Problem List   Diagnosis Date Noted  . Esophageal dysphagia   . Essential hypertension 01/19/2018  . Obesity (BMI 30.0-34.9) 01/19/2018  . Preventative health care 09/03/2017  . Eosinophilic esophagitis 62/69/4854  . Apophysitis 08/26/2017  . 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 Pain in joint, multiple sites 07/05/2017  . Osteoarthritis of knee 07/05/2017  . Lichen sclerosus et atrophicus 04/27/2017  . Headache 04/27/2017  . Headache disorder 02/17/2017  . Post-concussion headache 02/17/2017  .  Medication monitoring encounter 09/21/2016  . Hematuria 04/23/2016  . Hx of cervical cancer 01/30/2016  . Menopause 01/30/2016  . Status post bilateral breast biopsy 01/07/2016  . Chronic nausea 10/25/2015  . Screening for STD (sexually transmitted disease) 10/25/2015  . Rectal bleeding 04/30/2015  . Right knee pain 07/23/2014  . Depression, major, recurrent, moderate ([place]) 02/02/2014  . Adult hypothyroidism 02/02/2014  . Obstructive apnea 02/02/2014  . Anxiety and depression 02/02/2014  . Incomplete bladder emptying 12/12/2012  . Tenosynovitis of foot 05/30/2012  . Benign neoplasm of kidney 05/13/2012  . Urge incontinence 05/13/2012  . FOM (frequency of micturition) 05/13/2012  . Chronic pain of right hand 05/05/2012  . Tarsal tunnel syndrome 03/03/2012    Past Surgical History:  Procedure Laterality Date  . ABDOMINAL HYSTERECTOMY     partial hysterectomy  . BREAST BIOPSY Bilateral 01/01/2016   [name] cores. Fibroadenomas  . COLONOSCOPY WITH PROPOFOL N/A 06/27/2015   Procedure: COLONOSCOPY WITH PROPOFOL;  Surgeon: Josefine Class, MD;  Location: [facility] ENDOSCOPY;  Service: Endoscopy;  Laterality: N/A;  . COLONOSCOPY WITH PROPOFOL N/A 09/23/2017   Procedure: COLONOSCOPY WITH PROPOFOL;  Surgeon: Jonathon Bellows, MD;  Location: [facility] ENDOSCOPY;  Service: Gastroenterology;  Laterality: N/A;  . ESOPHAGOGASTRODUODENOSCOPY (EGD) WITH PROPOFOL N/A 07/22/2017   Procedure: ESOPHAGOGASTRODUODENOSCOPY (EGD) WITH PROPOFOL;  Surgeon: Jonathon Bellows, MD;  Location[facility] ENDOSCOPY;  Service: Gastroenterology;  Laterality: N/A;  . ESOPHAGOGASTRODUODENOSCOPY (EGD) WITH PROPOFOL N/A 03/31/2018   Procedure: ESOPHAGOGASTRODUODENOSCOPY (EGD)  WITH PROPOFOL;  Surgeon: Lin Landsman, MD;  Location[facility] ENDOSCOPY;  Service: Gastroenterology;  Laterality: N/A;  . KNEE ARTHROSCOPY Right 07/22/2016   Procedure: ARTHROSCOPY KNEE debridement microfracture;  Surgeon: Leanor Kail, MD;  Location: [facility] ORS;  Service:  Orthopedics;  Laterality: Right;  . KNEE ARTHROSCOPY WITH MEDIAL MENISECTOMY Left 11/10/2017   Procedure: KNEE ARTHROSCOPY WITH PARTIAL MEDIAL MENISECTOMY&patella femoral debridement, & ablation;  Surgeon: Leanor Kail, MD;  Location: [facility] ORS;  Service: Orthopedics;  Laterality: Left;  . TUBAL LIGATION      Prior to Admission medications   Medication Sig Start Date End Date Taking? Authorizing Provider  cyclobenzaprine (FLEXERIL) 10 MG tablet Take 1 tablet (10 mg total) by mouth 3 (three) times daily as needed. 05/23/18   Sable Feil, PA-C  FLUoxetine (PROZAC) 20 MG capsule Take 20 mg by mouth daily. 05/13/18   [provider]  ibuprofen (ADVIL,MOTRIN) 600 MG tablet Take 1 tablet (600 mg total) by mouth every 8 (eight) hours as needed. 07/25/18   Sable Feil, PA-C  levothyroxine (SYNTHROID, LEVOTHROID) 75 MCG tablet Take 1 tablet (75 mcg total) by mouth daily. 02/24/18   Poulose, Bethel Born, NP  traMADol (ULTRAM) 50 MG tablet Take 1 tablet (50 mg total) by mouth every 12 (twelve) hours as needed. 07/25/18   Sable Feil, PA-C  traZODone (DESYREL) 50 MG tablet Take by mouth.    [provider]    Allergies Percocet [oxycodone-acetaminophen]  Family History  Problem Relation Age of Onset  . Cancer Father        unknown  . Diabetes Mother   . Breast cancer Maternal Aunt   . Cancer Maternal Grandmother        cancer?  . Cancer Paternal Grandmother        cancer?  . Cancer Paternal Grandfather        unknown    Social History Social History   Tobacco Use  . Smoking status: Never Smoker  . Smokeless tobacco: Never Used  Substance Use Topics  . Alcohol use: No  . Drug use: No    Review of Systems  Constitutional: No fever/chills Eyes: No visual changes. ENT: No sore throat. Cardiovascular: Denies chest pain. Respiratory: Denies shortness of breath. Gastrointestinal: No abdominal pain.  No nausea, no vomiting.  No diarrhea.  No  constipation. Genitourinary: As above Musculoskeletal: Negative for back pain. Skin: Negative for rash. Neurological: Negative for headaches, focal weakness or numbness.   ____________________________________________   PHYSICAL EXAM:  VITAL SIGNS: ED Triage Vitals [09/01/18 0751]  Enc Vitals Group     BP      Pulse      Resp      Temp      Temp src      SpO2      Weight 168 lb (76.2 kg)     Height 5\' 1"  (1.549 m)     Head Circumference      Peak Flow      Pain Score 10     Pain Loc      Pain Edu?      Excl. in [place]?     Constitutional: Alert and oriented. Well appearing and in no acute distress. Eyes: Conjunctivae are normal.  Head: Atraumatic. Nose: No congestion/rhinnorhea. Mouth/Throat: Mucous membranes are moist.  Neck: No stridor.   Cardiovascular: Normal rate, regular rhythm. Grossly normal heart sounds.  Respiratory: Normal respiratory effort.  No retractions. Lungs CTAB. Gastrointestinal: Soft and nontender. No distention.  Genitourinary: Leg bag to the right leg with a small amount of yellow urine in the bag.  The elastic strap that is trapping the leg bag on to the patient is wrapped around the area where the tube inserts into the bag.  We readjusted the elastic band so it is no longer compressing the bag and causing obstruction.  There is no blood visualized in the urine around the Foley insertion site. Musculoskeletal: No lower extremity tenderness nor edema.  No joint effusions. Neurologic:  Normal speech and language. No gross focal neurologic deficits are appreciated. Skin:  Skin is warm, dry and intact. No rash noted. Psychiatric: Mood and affect are normal. Speech and behavior are normal.  ____________________________________________   LABS (all labs ordered are listed, but only abnormal results are displayed)  Labs Reviewed - No data to  display ____________________________________________  EKG   ____________________________________________  RADIOLOGY   ____________________________________________   PROCEDURES  Procedure(s) performed:   Procedures  Critical Care performed:   ____________________________________________   INITIAL IMPRESSION / ASSESSMENT AND PLAN / ED COURSE  Pertinent labs & imaging results that were available during my care of the patient were reviewed by me and considered in my medical decision making (see chart for details).  DDX: Foley malfunction, Foley obstruction, hematuria As part of my medical decision making, I reviewed the following data within the electronic MEDICAL RECORD NUMBER Notes from prior ED visits  ----------------------------------------- 8:34 AM on 09/01/2018 -----------------------------------------  After removal of the mechanical obstruction the patient's Foley is flushing/flowing without issue.  Patient to be discharged at this time.  Patient understands diagnosis well treatment and willing to comply.  We will follow-up with urology.  Patient without any pain even prior to relief of obstruction.  Urine was still flowing into the bag despite this partial obstruction.  Patient nontoxic.  I do not believe that she would have obstructive uropathy secondary to this.  Patient does not appear septic. ____________________________________________   FINAL CLINICAL IMPRESSION(S) / ED DIAGNOSES  Urinary catheter malfunction.  NEW MEDICATIONS STARTED DURING THIS VISIT:  New Prescriptions   No medications on file     Note:  This document was prepared using Dragon voice recognition software and may include unintentional dictation errors.     Orbie Pyo, MD 09/01/18 [phone]

## 2018-09-01 NOTE — ED Triage Notes (Addendum)
Pt seen yesterday, had foley catheter placed, states leaking around insertion site.  Pt also states she noticed blood when she urinates as well.

## 2018-09-01 NOTE — ED Notes (Signed)
Per Dr. Clearnce Hasten, this RN flushed catheter with 50cc of NS.  150 cc of urine came out into new leg bag at this time.

## 2018-09-01 NOTE — ED Notes (Signed)
When this RN assessed patient's leg bag catheter, the strap was placed over the tubing where the urine would run out.  Strap was very tight on patient's leg.  This RN adjusted the strap and urine began to slowly flow into the bag.

## 2018-09-02 ENCOUNTER — Encounter: Payer: Self-pay | Admitting: Emergency Medicine

## 2018-09-02 ENCOUNTER — Other Ambulatory Visit: Payer: Self-pay

## 2018-09-02 ENCOUNTER — Emergency Department
Admission: EM | Admit: 2018-09-02 | Discharge: 2018-09-02 | Disposition: A | Discharge status: Home / Self Care | Payer: Medicaid Other | Attending: Emergency Medicine | Admitting: Emergency Medicine

## 2018-09-02 DIAGNOSIS — Z79899 Other long term (current) drug therapy: Secondary | ICD-10-CM | POA: Insufficient documentation

## 2018-09-02 DIAGNOSIS — T839XXD Unspecified complication of genitourinary prosthetic device, implant and graft, subsequent encounter: Secondary | ICD-10-CM

## 2018-09-02 DIAGNOSIS — R339 Retention of urine, unspecified: Secondary | ICD-10-CM | POA: Diagnosis present

## 2018-09-02 LAB — URINALYSIS, COMPLETE (UACMP) WITH MICROSCOPIC
Bilirubin Urine: NEGATIVE
GLUCOSE, UA: NEGATIVE mg/dL
KETONES UR: 5 mg/dL — AB
NITRITE: NEGATIVE
PROTEIN: 100 mg/dL — AB
RBC / HPF: >50 RBC/hpf — ABNORMAL HIGH (ref 0–5)
Specific Gravity, Urine: 1.026 (ref 1.005–1.030)
pH: 6 (ref 5.0–8.0)

## 2018-09-02 MED ORDER — CEPHALEXIN 500 MG PO CAPS: 500.0000 mg | ORAL_CAPSULE | Freq: Two times a day (BID) | ORAL | 0 refills | Status: DC

## 2018-09-02 MED ORDER — CEPHALEXIN 500 MG PO CAPS: 500.0000 mg | ORAL_CAPSULE | Freq: Once | ORAL | Status: DC

## 2018-09-02 NOTE — ED Notes (Signed)
Pt ambulatory to toilet a second time.

## 2018-09-02 NOTE — ED Notes (Signed)
Urine sent

## 2018-09-02 NOTE — ED Notes (Signed)
Pt ambulatory to toilet again.

## 2018-09-02 NOTE — Discharge Instructions (Addendum)
Your symptoms have improved.  You are being discharged to follow with Dr. Hollice Espy as scheduled.  Return to the ED as needed.

## 2018-09-02 NOTE — ED Notes (Signed)
Pt ambulatory to toilet

## 2018-09-02 NOTE — ED Triage Notes (Signed)
Pt to ed with c/o urinary catheter issues.  Pt states she wants the catheter removed and feels she will be able to urinate on her own without the catheter.  Pt states she had leaking all night long around the catheter.

## 2018-09-02 NOTE — ED Provider Notes (Signed)
[facility] Emergency Department Provider Note ____________________________________________  Time seen: 1115  I have reviewed the triage vital signs and the nursing notes.  HISTORY  Chief Complaint  Urinary Retention and Catheter Removal  HPI Kristina Reed is a 52 y.o. female presents to the ED for request for removal of her Foley catheter.  Patient had the Foley pain asked 3 days prior, with complaints of about 3 hours of urinary retention.  She initially reported to her primary care provider, who sent her to the ED for Foley cath insertion.  She was found to have low volume in the bladder and her urinalysis was assuring at the time.  Patient came in 1 day subsequent, citing leakage from around the Foley catheter in the bag.  She describes having the bag replaced on a subsequent visit, but the Foley catheter itself was never changed.  She presents today complaining of continued leakage around the catheter all night.  She describes she soaked her bed linens secondary to leakage around the Foley catheter.  She is requesting removal of the cath site and that she has had no dysuria and does not believe to have any difficulty emptying her bladder spontaneously.  She denies any pelvic pain, pressure, nausea, vomiting, dizziness.  Past Medical History:  Diagnosis Date  . Anxiety   . Arthritis    right knee and right elbow  . Cancer ([place])    Cervical CA with partial hysterectomy.  . Cognitive impairment, mild, so stated   . Depression   . Diverticulosis   . Eosinophilic esophagitis 94/17/4081   Biopsy Dec 2018  . Gallstones   . GERD (gastroesophageal reflux disease)   . Hx of cervical cancer 01/30/2016  . Hypertension   . Hypothyroidism   . Sleep apnea    does not use a C-PAP  . Status post partial hysterectomy    Due to Cervical CA  . Vaginal inclusion cyst     Patient Active Problem List   Diagnosis Date Noted  . Esophageal dysphagia   . Essential hypertension  01/19/2018  . Obesity (BMI 30.0-34.9) 01/19/2018  . Preventative health care 09/03/2017  . Eosinophilic esophagitis 44/81/8563  . Apophysitis 08/26/2017  . 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 Pain in joint, multiple sites 07/05/2017  . Osteoarthritis of knee 07/05/2017  . Lichen sclerosus et atrophicus 04/27/2017  . Headache 04/27/2017  . Headache disorder 02/17/2017  . Post-concussion headache 02/17/2017  . Medication monitoring encounter 09/21/2016  . Hematuria 04/23/2016  . Hx of cervical cancer 01/30/2016  . Menopause 01/30/2016  . Status post bilateral breast biopsy 01/07/2016  . Chronic nausea 10/25/2015  . Screening for STD (sexually transmitted disease) 10/25/2015  . Rectal bleeding 04/30/2015  . Right knee pain 07/23/2014  . Depression, major, recurrent, moderate ([place]) 02/02/2014  . Adult hypothyroidism 02/02/2014  . Obstructive apnea 02/02/2014  . Anxiety and depression 02/02/2014  . Incomplete bladder emptying 12/12/2012  . Tenosynovitis of foot 05/30/2012  . Benign neoplasm of kidney 05/13/2012  . Urge incontinence 05/13/2012  . FOM (frequency of micturition) 05/13/2012  . Chronic pain of right hand 05/05/2012  . Tarsal tunnel syndrome 03/03/2012    Past Surgical History:  Procedure Laterality Date  . ABDOMINAL HYSTERECTOMY     partial hysterectomy  . BREAST BIOPSY Bilateral 01/01/2016   [name] cores. Fibroadenomas  . COLONOSCOPY WITH PROPOFOL N/A 06/27/2015   Procedure: COLONOSCOPY WITH PROPOFOL;  Surgeon: Josefine Class, MD;  Location: [facility] ENDOSCOPY;  Service: Endoscopy;  Laterality:  N/A;  . COLONOSCOPY WITH PROPOFOL N/A 09/23/2017   Procedure: COLONOSCOPY WITH PROPOFOL;  Surgeon: Jonathon Bellows, MD;  Location: [facility] ENDOSCOPY;  Service: Gastroenterology;  Laterality: N/A;  . ESOPHAGOGASTRODUODENOSCOPY (EGD) WITH PROPOFOL N/A 07/22/2017   Procedure: ESOPHAGOGASTRODUODENOSCOPY (EGD) WITH PROPOFOL;  Surgeon: Jonathon Bellows, MD;  Location: [facility] ENDOSCOPY;  Service:  Gastroenterology;  Laterality: N/A;  . ESOPHAGOGASTRODUODENOSCOPY (EGD) WITH PROPOFOL N/A 03/31/2018   Procedure: ESOPHAGOGASTRODUODENOSCOPY (EGD) WITH PROPOFOL;  Surgeon: Lin Landsman, MD;  Location: [facility] ENDOSCOPY;  Service: Gastroenterology;  Laterality: N/A;  . KNEE ARTHROSCOPY Right 07/22/2016   Procedure: ARTHROSCOPY KNEE debridement microfracture;  Surgeon: Leanor Kail, MD;  Location: [facility] ORS;  Service: Orthopedics;  Laterality: Right;  . KNEE ARTHROSCOPY WITH MEDIAL MENISECTOMY Left 11/10/2017   Procedure: KNEE ARTHROSCOPY WITH PARTIAL MEDIAL MENISECTOMY&patella femoral debridement, & ablation;  Surgeon: Leanor Kail, MD;  Location: [facility] ORS;  Service: Orthopedics;  Laterality: Left;  . TUBAL LIGATION      Prior to Admission medications   Medication Sig Start Date End Date Taking? Authorizing Provider  cyclobenzaprine (FLEXERIL) 10 MG tablet Take 1 tablet (10 mg total) by mouth 3 (three) times daily as needed. 05/23/18   Sable Feil, PA-C  FLUoxetine (PROZAC) 20 MG capsule Take 20 mg by mouth daily. 05/13/18   [provider]  ibuprofen (ADVIL,MOTRIN) 600 MG tablet Take 1 tablet (600 mg total) by mouth every 8 (eight) hours as needed. 07/25/18   Sable Feil, PA-C  levothyroxine (SYNTHROID, LEVOTHROID) 75 MCG tablet Take 1 tablet (75 mcg total) by mouth daily. 02/24/18   Poulose, Bethel Born, NP  traMADol (ULTRAM) 50 MG tablet Take 1 tablet (50 mg total) by mouth every 12 (twelve) hours as needed. 07/25/18   Sable Feil, PA-C  traZODone (DESYREL) 50 MG tablet Take by mouth.    [provider]    Allergies Percocet [oxycodone-acetaminophen]  Family History  Problem Relation Age of Onset  . Cancer Father        unknown  . Diabetes Mother   . Breast cancer Maternal Aunt   . Cancer Maternal Grandmother        cancer?  . Cancer Paternal Grandmother        cancer?  . Cancer Paternal Grandfather        unknown    Social History Social  History   Tobacco Use  . Smoking status: Never Smoker  . Smokeless tobacco: Never Used  Substance Use Topics  . Alcohol use: No  . Drug use: No    Review of Systems  Constitutional: Negative for fever. Cardiovascular: Negative for chest pain. Respiratory: Negative for shortness of breath. Gastrointestinal: Negative for abdominal pain, vomiting and diarrhea. Genitourinary: Negative for dysuria. Foley cath leakage Musculoskeletal: Negative for back pain. Skin: Negative for rash. Neurological: Negative for headaches, focal weakness or numbness. ____________________________________________  PHYSICAL EXAM:  VITAL SIGNS: ED Triage Vitals  Enc Vitals Group     BP 09/02/18 1023 (!) 119/92     Pulse Rate 09/02/18 1023 71     Resp 09/02/18 1023 18     Temp 09/02/18 1023 98 F (36.7 C)     Temp Source 09/02/18 1023 Oral     SpO2 09/02/18 1023 100 %     Weight 09/02/18 1024 168 lb (76.2 kg)     Height 09/02/18 1024 5\' 2"  (1.575 m)     Head Circumference --      Peak Flow --      Pain Score  09/02/18 1026 0     Pain Loc --      Pain Edu? --      Excl. in [place]? --     Constitutional: Alert and oriented. Well appearing and in no distress. Head: Normocephalic and atraumatic. Eyes: Conjunctivae are normal. Normal extraocular movements Cardiovascular: Normal rate, regular rhythm. Normal distal pulses. Respiratory: Normal respiratory effort. No wheezes/rales/rhonchi. Gastrointestinal: Soft and nontender. No distention, rebound, guarding, or rigidity. No CVA tenderness noted.  Musculoskeletal: Nontender with normal range of motion in all extremities.  Neurologic:  Normal gait without ataxia. Normal speech and language. No gross focal neurologic deficits are appreciated. Skin:  Skin is warm, dry and intact. No rash noted. Psychiatric: Mood and affect are normal. Patient exhibits appropriate insight and judgment. ____________________________________________   LABS (pertinent  positives/negatives) Labs Reviewed  URINALYSIS, COMPLETE (UACMP) WITH MICROSCOPIC - Abnormal; Notable for the following components:      Result Value   Color, Urine AMBER (*)    APPearance CLOUDY (*)    Hgb urine dipstick LARGE (*)    Ketones, ur 5 (*)    Protein, ur 100 (*)    Leukocytes, UA MODERATE (*)    RBC / HPF >50 (*)    WBC, UA >50 (*)    Bacteria, UA MANY (*)    All other components within normal limits  ____________________________________________  PROCEDURES  Procedures Foley catheter discontinued ____________________________________________  INITIAL IMPRESSION / ASSESSMENT AND PLAN / ED COURSE  Patient with ED evaluation and subsequent visit for Foley catheter complication.  Patient is reporting leakage around the Foley cath.  She has presented today with a request to have the Foley catheter removed.  Patient has been able to spontaneously void after discontinuation of the Foley catheter.  Her urinalysis shows some hematuria and leukocyturia, but patient is asymptomatic at this time.  We discussed the indication to not treat empirically at this time.  Patient will follow-up with Hollice Espy, MD in 2 weeks as scheduled.  She will otherwise return to the ED for any dysuria or urinary retention. ____________________________________________  FINAL CLINICAL IMPRESSION(S) / ED DIAGNOSES  Final diagnoses:  Problem with Foley catheter, subsequent encounter  Urinary retention      Melvenia Needles, PA-C 09/02/18 1753    Harvest Dark, MD 09/03/18 1102

## 2018-09-05 ENCOUNTER — Other Ambulatory Visit (HOSPITAL_COMMUNITY)
Admission: RE | Admit: 2018-09-05 | Discharge: 2018-09-05 | Disposition: A | Discharge status: Home / Self Care | Payer: Medicaid Other | Source: Ambulatory Visit | Attending: Family Medicine | Admitting: Family Medicine

## 2018-09-05 ENCOUNTER — Encounter: Payer: Self-pay | Admitting: Family Medicine

## 2018-09-05 ENCOUNTER — Ambulatory Visit: Payer: Medicaid Other | Admitting: Family Medicine

## 2018-09-05 VITALS — BP 118/78 | HR 89 | Temp 98.2°F | Ht 61.5 in | Wt 168.9 lb

## 2018-09-05 DIAGNOSIS — E039 Hypothyroidism, unspecified: Secondary | ICD-10-CM

## 2018-09-05 DIAGNOSIS — Z114 Encounter for screening for human immunodeficiency virus [HIV]: Secondary | ICD-10-CM

## 2018-09-05 DIAGNOSIS — E669 Obesity, unspecified: Secondary | ICD-10-CM

## 2018-09-05 DIAGNOSIS — Z113 Encounter for screening for infections with a predominantly sexual mode of transmission: Secondary | ICD-10-CM

## 2018-09-05 DIAGNOSIS — Z803 Family history of malignant neoplasm of breast: Secondary | ICD-10-CM

## 2018-09-05 DIAGNOSIS — Z23 Encounter for immunization: Secondary | ICD-10-CM

## 2018-09-05 DIAGNOSIS — R079 Chest pain, unspecified: Secondary | ICD-10-CM | POA: Diagnosis not present

## 2018-09-05 DIAGNOSIS — R9431 Abnormal electrocardiogram [ECG] [EKG]: Secondary | ICD-10-CM

## 2018-09-05 DIAGNOSIS — Z0000 Encounter for general adult medical examination without abnormal findings: Secondary | ICD-10-CM

## 2018-09-05 DIAGNOSIS — R339 Retention of urine, unspecified: Secondary | ICD-10-CM

## 2018-09-05 MED ORDER — LEVOTHYROXINE SODIUM 75 MCG PO TABS: 75.0000 ug | ORAL_TABLET | Freq: Every day | ORAL | 1 refills | Status: DC

## 2018-09-05 NOTE — Patient Instructions (Addendum)
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 Jeannie Fend, Phone: [phone]  Check out the information at familydoctor.org entitled "Nutrition for Weight Loss: What You Need to Know about Fad Diets" Try to lose between 1-2 pounds per week by taking in fewer calories and burning off more calories You can succeed by limiting portions, limiting foods dense in calories and fat, becoming more active, and drinking 8 glasses of water a day (64 ounces) Don't skip meals, especially breakfast, as skipping meals may alter your metabolism Do not use over-the-counter weight loss pills or gimmicks that claim rapid weight loss A healthy BMI (or body mass index) is between 18.5 and 24.9 You can calculate your ideal BMI at the [place] website ClubMonetize.fr   Obesity, Adult Obesity is the condition of having too much total body fat. Being overweight or obese means that your weight is greater than what is considered healthy for your body size. Obesity is determined by a measurement called BMI. BMI is an estimate of body fat and is calculated from height and weight. For adults, a BMI of 30 or higher is considered obese. Obesity can eventually lead to other health concerns and major illnesses, including:  Stroke.  Coronary artery disease (CAD).  Type 2 diabetes.  Some types of cancer, including cancers of the colon, breast, uterus, and gallbladder.  Osteoarthritis.  High blood pressure (hypertension).  High cholesterol.  Sleep apnea.  Gallbladder stones.  Infertility problems. What are the causes? The main cause of obesity is taking in (consuming) more calories than your body uses for energy. Other factors that contribute to this condition may include:  Being born with genes that make  you more likely to become obese.  Having a medical condition that causes obesity. These conditions include: ? Hypothyroidism. ? Polycystic ovarian syndrome (PCOS). ? Binge-eating disorder. ? Cushing syndrome.  Taking certain medicines, such as steroids, antidepressants, and seizure medicines.  Not being physically active (sedentary lifestyle).  Living where there are limited places to exercise safely or buy healthy foods.  Not getting enough sleep. What increases the risk? The following factors may increase your risk of this condition:  Having a family history of obesity.  Being a woman of African-American descent.  Being a man of Hispanic descent. What are the signs or symptoms? Having excessive body fat is the main symptom of this condition. How is this diagnosed? This condition may be diagnosed based on:  Your symptoms.  Your medical history.  A physical exam. Your health care provider may measure: ? Your BMI. If you are an adult with a BMI between 25 and less than 30, you are considered overweight. If you are an adult with a BMI of 30 or higher, you are considered obese. ? The distances around your hips and your waist (circumferences). These may be compared to each other to help diagnose your condition. ? Your skinfold thickness. Your health care provider may gently pinch a fold of your skin and measure it. How is this treated? Treatment for this condition often includes changing your lifestyle. Treatment may include some or all of the following:  Dietary changes. Work with your health care provider and a dietitian to set a weight-loss goal that is healthy and reasonable for you. Dietary changes may include eating: ? Smaller portions. A portion size is the amount of a  particular food that is healthy for you to eat at one time. This varies from person to person. ? Low-calorie or low-fat options. ? More whole grains, fruits, and vegetables.  Regular physical activity.  This may include aerobic activity (cardio) and strength training.  Medicine to help you lose weight. Your health care provider may prescribe medicine if you are unable to lose 1 pound a week after 6 weeks of eating more healthily and doing more physical activity.  Surgery. Surgical options may include gastric banding and gastric bypass. Surgery may be done if: ? Other treatments have not helped to improve your condition. ? You have a BMI of 40 or higher. ? You have life-threatening health problems related to obesity. Follow these instructions at home:  Eating and drinking   Follow recommendations from your health care provider about what you eat and drink. Your health care provider may advise you to: ? Limit fast foods, sweets, and processed snack foods. ? Choose low-fat options, such as low-fat milk instead of whole milk. ? Eat 5 or more servings of fruits or vegetables every day. ? Eat at home more often. This gives you more control over what you eat. ? Choose healthy foods when you eat out. ? Learn what a healthy portion size is. ? Keep low-fat snacks on hand. ? Avoid sugary drinks, such as soda, fruit juice, iced tea sweetened with sugar, and flavored milk. ? Eat a healthy breakfast.  Drink enough water to keep your urine clear or pale yellow.  Do not go without eating for long periods of time (do not fast) or follow a fad diet. Fasting and fad diets can be unhealthy and even dangerous. Physical Activity  Exercise regularly, as told by your health care provider. Ask your health care provider what types of exercise are safe for you and how often you should exercise.  Warm up and stretch before being active.  Cool down and stretch after being active.  Rest between periods of activity. Lifestyle  Limit the time that you spend in front of your TV, computer, or video game system.  Find ways to reward yourself that do not involve food.  Limit alcohol intake to no more than 1  drink a day for nonpregnant women and 2 drinks a day for men. One drink equals 12 oz of beer, 5 oz of wine, or 1 oz of hard liquor. General instructions  Keep a weight loss journal to keep track of the food you eat and how much you exercise you get.  Take over-the-counter and prescription medicines only as told by your health care provider.  Take vitamins and supplements only as told by your health care provider.  Consider joining a support group. Your health care provider may be able to recommend a support group.  Keep all follow-up visits as told by your health care provider. This is important. Contact a health care provider if:  You are unable to meet your weight loss goal after 6 weeks of dietary and lifestyle changes. This information is not intended to replace advice given to you by your health care provider. Make sure you discuss any questions you have with your health care provider. Document Released: 10/08/2004 Document Revised: 02/03/2016 Document Reviewed: 06/19/2015 Elsevier Interactive Patient Education  2019 [employer]  Preventing Unhealthy [employer], Adult Staying at a healthy weight is important to your overall health. When fat builds up in your body, you may become overweight or obese. Being overweight or obese increases your  risk of developing certain health problems, such as heart disease, diabetes, sleeping problems, joint problems, and some types of cancer. Unhealthy weight gain is often the result of making unhealthy food choices or not getting enough exercise. You can make changes to your lifestyle to prevent obesity and stay as healthy as possible. What nutrition changes can be made?   Eat only as much as your body needs. To do this: ? Pay attention to signs that you are hungry or full. Stop eating as soon as you feel full. ? If you feel hungry, try drinking water first before eating. Drink enough water so your urine is clear or pale yellow. ? Eat smaller  portions. Pay attention to portion sizes when eating out. ? Look at serving sizes on food labels. Most foods contain more than one serving per container. ? Eat the recommended number of calories for your gender and activity level. For most active people, a daily total of 2,000 calories is appropriate. If you are trying to lose weight or are not very active, you may need to eat fewer calories. Talk with your health care provider or a diet and nutrition specialist (dietitian) about how many calories you need each day.  Choose healthy foods, such as: ? Fruits and vegetables. At each meal, try to fill at least half of your plate with fruits and vegetables. ? Whole grains, such as whole-wheat bread, brown rice, and quinoa. ? Lean meats, such as chicken or fish. ? Other healthy proteins, such as beans, eggs, or tofu. ? Healthy fats, such as nuts, seeds, fatty fish, and olive oil. ? Low-fat or fat-free dairy products.  Check food labels, and avoid food and drinks that: ? Are high in calories. ? Have added sugar. ? Are high in sodium. ? Have saturated fats or trans fats.  Cook foods in healthier ways, such as by baking, broiling, or grilling.  Make a meal plan for the week, and shop with a grocery list to help you stay on track with your purchases. Try to avoid going to the grocery store when you are hungry.  When grocery shopping, try to shop around the outside of the store first, where the fresh foods are. Doing this helps you to avoid prepackaged foods, which can be high in sugar, salt (sodium), and fat. What lifestyle changes can be made?   Exercise for 30 or more minutes on 5 or more days each week. Exercising may include brisk walking, yard work, biking, running, swimming, and team sports like basketball and soccer. Ask your health care provider which exercises are safe for you.  Do muscle-strengthening activities, such as lifting weights or using resistance bands, on 2 or more days a  week.  Do not use any products that contain nicotine or tobacco, such as cigarettes and e-cigarettes. If you need help quitting, ask your health care provider.  Limit alcohol intake to no more than 1 drink a day for nonpregnant women and 2 drinks a day for men. One drink equals 12 oz of beer, 5 oz of wine, or 1 oz of hard liquor.  Try to get 7-9 hours of sleep each night. What other changes can be made?  Keep a food and activity journal to keep track of: ? What you ate and how many calories you had. Remember to count the calories in sauces, dressings, and side dishes. ? Whether you were active, and what exercises you did. ? Your calorie, weight, and activity goals.  Check your weight  regularly. Track any changes. If you notice you have gained weight, make changes to your diet or activity routine.  Avoid taking weight-loss medicines or supplements. Talk to your health care provider before starting any new medicine or supplement.  Talk to your health care provider before trying any new diet or exercise plan. Why are these changes important? Eating healthy, staying active, and having healthy habits can help you to prevent obesity. Those changes also:  Help you manage stress and emotions.  Help you connect with friends and family.  Improve your self-esteem.  Improve your sleep.  Prevent long-term health problems. What can happen if changes are not made? Being obese or overweight can cause you to develop joint or bone problems, which can make it hard for you to stay active or do activities you enjoy. Being obese or overweight also puts stress on your heart and lungs and can lead to health problems like diabetes, heart disease, and some cancers. Where to find more information Talk with your health care provider or a dietitian about healthy eating and healthy lifestyle choices. You may also find information from:  [place] Department of Agriculture, MyPlate:  FormerBoss.no  American Heart Association: www.heart.org  Centers for Disease Control and Prevention: http://www.wolf.info/ Summary  Staying at a healthy weight is important to your overall health. It helps you to prevent certain diseases and health problems, such as heart disease, diabetes, joint problems, sleep disorders, and some types of cancer.  Being obese or overweight can cause you to develop joint or bone problems, which can make it hard for you to stay active or do activities you enjoy.  You can prevent unhealthy weight gain by eating a healthy diet, exercising regularly, not smoking, limiting alcohol, and getting enough sleep.  Talk with your health care provider or a dietitian for guidance about healthy eating and healthy lifestyle choices. This information is not intended to replace advice given to you by your health care provider. Make sure you discuss any questions you have with your health care provider. Document Released: 09/01/2016 Document Revised: 06/11/2017 Document Reviewed: 10/07/2016 Elsevier Interactive Patient Education  2019 [place] Maintenance, Female Adopting a healthy lifestyle and getting preventive care can go a long way to promote health and wellness. Talk with your health care provider about what schedule of regular examinations is right for you. This is a good chance for you to check in with your provider about disease prevention and staying healthy. In between checkups, there are plenty of things you can do on your own. Experts have done a lot of research about which lifestyle changes and preventive measures are most likely to keep you healthy. Ask your health care provider for more information. Weight and diet Eat a healthy diet  Be sure to include plenty of vegetables, fruits, low-fat dairy products, and lean protein.  Do not eat a lot of foods high in solid fats, added sugars, or salt.  Get regular exercise. This is one of the most  important things you can do for your health. ? Most adults should exercise for at least 150 minutes each week. The exercise should increase your heart rate and make you sweat (moderate-intensity exercise). ? Most adults should also do strengthening exercises at least twice a week. This is in addition to the moderate-intensity exercise. Maintain a healthy weight  Body mass index (BMI) is a measurement that can be used to identify possible weight problems. It estimates body fat based on height and weight. Your health  care provider can help determine your BMI and help you achieve or maintain a healthy weight.  For females 10 years of age and older: ? A BMI below 18.5 is considered underweight. ? A BMI of 18.5 to 24.9 is normal. ? A BMI of 25 to 29.9 is considered overweight. ? A BMI of 30 and above is considered obese. Watch levels of cholesterol and blood lipids  You should start having your blood tested for lipids and cholesterol at 50 years of age, then have this test every 5 years.  You may need to have your cholesterol levels checked more often if: ? Your lipid or cholesterol levels are high. ? You are older than 50 years of age. ? You are at high risk for heart disease. Cancer screening Lung Cancer  Lung cancer screening is recommended for adults 19-79 years old who are at high risk for lung cancer because of a history of smoking.  A yearly low-dose CT scan of the lungs is recommended for people who: ? Currently smoke. ? Have quit within the past 15 years. ? Have at least a 30-pack-year history of smoking. A pack year is smoking an average of one pack of cigarettes a day for 1 year.  Yearly screening should continue until it has been 15 years since you quit.  Yearly screening should stop if you develop a health problem that would prevent you from having lung cancer treatment. Breast Cancer  Practice breast self-awareness. This means understanding how your breasts normally appear  and feel.  It also means doing regular breast self-exams. Let your health care provider know about any changes, no matter how small.  If you are in your 20s or 30s, you should have a clinical breast exam (CBE) by a health care provider every 1-3 years as part of a regular health exam.  If you are 38 or older, have a CBE every year. Also consider having a breast X-ray (mammogram) every year.  If you have a family history of breast cancer, talk to your health care provider about genetic screening.  If you are at high risk for breast cancer, talk to your health care provider about having an MRI and a mammogram every year.  Breast cancer gene (BRCA) assessment is recommended for women who have family members with BRCA-related cancers. BRCA-related cancers include: ? Breast. ? Ovarian. ? Tubal. ? Peritoneal cancers.  Results of the assessment will determine the need for genetic counseling and BRCA1 and BRCA2 testing. Cervical Cancer Your health care provider may recommend that you be screened regularly for cancer of the pelvic organs (ovaries, uterus, and vagina). This screening involves a pelvic examination, including checking for microscopic changes to the surface of your cervix (Pap test). You may be encouraged to have this screening done every 3 years, beginning at age 65.  For women ages 81-65, health care providers may recommend pelvic exams and Pap testing every 3 years, or they may recommend the Pap and pelvic exam, combined with testing for human papilloma virus (HPV), every 5 years. Some types of HPV increase your risk of cervical cancer. Testing for HPV may also be done on women of any age with unclear Pap test results.  Other health care providers may not recommend any screening for nonpregnant women who are considered low risk for pelvic cancer and who do not have symptoms. Ask your health care provider if a screening pelvic exam is right for you.  If you have had past treatment for  cervical cancer or a condition that could lead to cancer, you need Pap tests and screening for cancer for at least 20 years after your treatment. If Pap tests have been discontinued, your risk factors (such as having a new sexual partner) need to be reassessed to determine if screening should resume. Some women have medical problems that increase the chance of getting cervical cancer. In these cases, your health care provider may recommend more frequent screening and Pap tests. Colorectal Cancer  This type of cancer can be detected and often prevented.  Routine colorectal cancer screening usually begins at 50 years of age and continues through 50 years of age.  Your health care provider may recommend screening at an earlier age if you have risk factors for colon cancer.  Your health care provider may also recommend using home test kits to check for hidden blood in the stool.  A small camera at the end of a tube can be used to examine your colon directly (sigmoidoscopy or colonoscopy). This is done to check for the earliest forms of colorectal cancer.  Routine screening usually begins at age 66.  Direct examination of the colon should be repeated every 5-10 years through 50 years of age. However, you may need to be screened more often if early forms of precancerous polyps or small growths are found. Skin Cancer  Check your skin from head to toe regularly.  Tell your health care provider about any new moles or changes in moles, especially if there is a change in a mole's shape or color.  Also tell your health care provider if you have a mole that is larger than the size of a pencil eraser.  Always use sunscreen. Apply sunscreen liberally and repeatedly throughout the day.  Protect yourself by wearing long sleeves, pants, a wide-brimmed hat, and sunglasses whenever you are outside. Heart disease, diabetes, and high blood pressure  High blood pressure causes heart disease and increases the  risk of stroke. High blood pressure is more likely to develop in: ? People who have blood pressure in the high end of the normal range (130-139/85-89 mm Hg). ? People who are overweight or obese. ? People who are African American.  If you are 3-87 years of age, have your blood pressure checked every 3-5 years. If you are 22 years of age or older, have your blood pressure checked every year. You should have your blood pressure measured twice-once when you are at a hospital or clinic, and once when you are not at a hospital or clinic. Record the average of the two measurements. To check your blood pressure when you are not at a hospital or clinic, you can use: ? An automated blood pressure machine at a pharmacy. ? A home blood pressure monitor.  If you are between 48 years and 48 years old, ask your health care provider if you should take aspirin to prevent strokes.  Have regular diabetes screenings. This involves taking a blood sample to check your fasting blood sugar level. ? If you are at a normal weight and have a low risk for diabetes, have this test once every three years after 50 years of age. ? If you are overweight and have a high risk for diabetes, consider being tested at a younger age or more often. Preventing infection Hepatitis B  If you have a higher risk for hepatitis B, you should be screened for this virus. You are considered at high risk for hepatitis B if: ? You  were born in a country where hepatitis B is common. Ask your health care provider which countries are considered high risk. ? Your parents were born in a high-risk country, and you have not been immunized against hepatitis B (hepatitis B vaccine). ? You have HIV or AIDS. ? You use needles to inject street drugs. ? You live with someone who has hepatitis B. ? You have had sex with someone who has hepatitis B. ? You get hemodialysis treatment. ? You take certain medicines for conditions, including cancer, organ  transplantation, and autoimmune conditions. Hepatitis C  Blood testing is recommended for: ? Everyone born from [birth year] through 1963. ? Anyone with known risk factors for hepatitis C. Sexually transmitted infections (STIs)  You should be screened for sexually transmitted infections (STIs) including gonorrhea and chlamydia if: ? You are sexually active and are younger than 50 years of age. ? You are older than 50 years of age and your health care provider tells you that you are at risk for this type of infection. ? Your sexual activity has changed since you were last screened and you are at an increased risk for chlamydia or gonorrhea. Ask your health care provider if you are at risk.  If you do not have HIV, but are at risk, it may be recommended that you take a prescription medicine daily to prevent HIV infection. This is called pre-exposure prophylaxis (PrEP). You are considered at risk if: ? You are sexually active and do not regularly use condoms or know the HIV status of your partner(s). ? You take drugs by injection. ? You are sexually active with a partner who has HIV. Talk with your health care provider about whether you are at high risk of being infected with HIV. If you choose to begin PrEP, you should first be tested for HIV. You should then be tested every 3 months for as long as you are taking PrEP. Pregnancy  If you are premenopausal and you may become pregnant, ask your health care provider about preconception counseling.  If you may become pregnant, take 400 to 800 micrograms (mcg) of folic acid every day.  If you want to prevent pregnancy, talk to your health care provider about birth control (contraception). Osteoporosis and menopause  Osteoporosis is a disease in which the bones lose minerals and strength with aging. This can result in serious bone fractures. Your risk for osteoporosis can be identified using a bone density scan.  If you are 37 years of age or older, or  if you are at risk for osteoporosis and fractures, ask your health care provider if you should be screened.  Ask your health care provider whether you should take a calcium or vitamin D supplement to lower your risk for osteoporosis.  Menopause may have certain physical symptoms and risks.  Hormone replacement therapy may reduce some of these symptoms and risks. Talk to your health care provider about whether hormone replacement therapy is right for you. Follow these instructions at home:  Schedule regular health, dental, and eye exams.  Stay current with your immunizations.  Do not use any tobacco products including cigarettes, chewing tobacco, or electronic cigarettes.  If you are pregnant, do not drink alcohol.  If you are breastfeeding, limit how much and how often you drink alcohol.  Limit alcohol intake to no more than 1 drink per day for nonpregnant women. One drink equals 12 ounces of beer, 5 ounces of wine, or 1 ounces of hard liquor.  Do  not use street drugs.  Do not share needles.  Ask your health care provider for help if you need support or information about quitting drugs.  Tell your health care provider if you often feel depressed.  Tell your health care provider if you have ever been abused or do not feel safe at home. This information is not intended to replace advice given to you by your health care provider. Make sure you discuss any questions you have with your health care provider. Document Released: 03/16/2011 Document Revised: 02/06/2016 Document Reviewed: 06/04/2015 Elsevier Interactive Patient Education  2019 [employer].

## 2018-09-05 NOTE — Assessment & Plan Note (Signed)
USPSTF grade A and B recommendations reviewed with patient; age-appropriate recommendations, preventive care, screening tests, etc discussed and encouraged; healthy living encouraged; see AVS for patient education given to patient  

## 2018-09-05 NOTE — Assessment & Plan Note (Signed)
Refilled medicine; last TSH reviewed

## 2018-09-05 NOTE — Progress Notes (Signed)
BP 118/78   Pulse 89   Temp 98.2 F (36.8 C) (Oral)   Ht 5' 1.5" (1.562 m)   Wt 168 lb 14.4 oz (76.6 kg)   LMP 05/09/1993 (Approximate)   SpO2 98%   BMI 31.40 kg/m    Subjective:    Patient ID: [name], female    DOB: [birth year], 52 y.o.   MRN: [id number]  HPI: LAWSON ISABELL is a 52 y.o. female  Chief Complaint  Patient presents with  . Annual Exam  . Medication Refill    HPI Patient is here for her annual physical She has had urinary retention; was seen in ER; had a catheter and that did get removed; going to see urologist on January 9th; feeling much better because she can pee on her own; not finishing completely; no strong urine odor; reviewed the last urine test, they gave her antibiotics, but "they took it back and tore it up"  Lab Results  Component Value Date   TSH 1.78 02/23/2018  needs thyroid refills  USPSTF grade A and B recommendations Depression:  Depression screen [facility] 2/9 09/05/2018 03/08/2018 02/23/2018 08/26/2017 04/27/2017  Decreased Interest 0 0 0 0 0  Down, Depressed, Hopeless _0 0  PHQ - 2 Score _1 0  Altered sleeping 0 1 1 - -  Tired, decreased energy 0 0 0 - -  Change in appetite 0 0 0 - -  Feeling bad or failure about yourself  0 0 0 - -  Trouble concentrating 0 0 0 - -  Moving slowly or fidgety/restless 0 0 0 - -  Suicidal thoughts 0 0 0 - -  PHQ-9 Score _2 - -  Difficult doing work/chores Not difficult at all Not difficult at all Not difficult at all - -   Hypertension: BP Readings from Last 3 Encounters:  09/05/18 118/78  09/02/18 118/86  09/01/18 129/88   Obesity:  Wt Readings from Last 3 Encounters:  09/05/18 168 lb 14.4 oz (76.6 kg)  09/02/18 168 lb (76.2 kg)  09/01/18 168 lb (76.2 kg)   BMI Readings from Last 3 Encounters:  09/05/18 31.40 kg/m  09/02/18 31.74 kg/m  09/01/18 31.74 kg/m     Skin cancer: no worrisome moles Lung cancer:  nonsmoker Breast cancer: no lumps or bumps Colorectal cancer: Jan  2019 Cervical cancer screening: Dec 2017; reviewed; HPV negative BRCA gene screening: family hx of breast and/or ovarian cancer and/or metastatic prostate cancer? Grandmother and aunt; interested in screening HIV, hep B, hep C: HIV neg 2018 STD testing and prevention (chl/gon/syphilis): wants to have that done Intimate partner violence: no abuse Contraception: tubes are tied and partial hysterectomy Osteoporosis: n/a Fall prevention/vitamin D: discussed Immunizations:  Flu shot UTD; interested in shingles vaccine Diet: good eater; no fatty pig meat Exercise: she walks; down to the store and back, 6 minutes there and back, 12 minutes, but not very often; does walk around the house Alcohol:    Office Visit from 09/05/2018 in [facility]  AUDIT-C Score  0     Tobacco use: nonsmoker AAA: n/a Aspirin: The 10-year ASCVD risk score Mikey Bussing DC Jr., et al., 2013) is: 1.2%   Values used to calculate the score:     Age: 52 years     Sex: Female     Is Non-Hispanic African American: No     Diabetic: No     Tobacco smoker: No  Systolic Blood Pressure: 941 mmHg     Is BP treated: No     HDL Cholesterol: 50 mg/dL     Total Cholesterol: 201 mg/dL  Glucose:  Glucose  Date Value Ref Range Status  12/27/2014 90 mg/dL Final    Comment:    65-99 NOTE: New Reference Range  11/20/14   08/29/2014 88 65 - 99 mg/dL Final  02/22/2014 96 65 - 99 mg/dL Final   Glucose, Bld  Date Value Ref Range Status  08/31/2018 191 (H) 70 - 99 mg/dL Final  09/11/2017 104 (H) 65 - 99 mg/dL Final  09/03/2017 90 65 - 99 mg/dL Final    Comment:    .            Fasting reference interval .    Lipids:  Lab Results  Component Value Date   CHOL 201 (H) 09/03/2017   CHOL 162 09/02/2016   Lab Results  Component Value Date   HDL 50 (L) 09/03/2017   HDL 54 09/02/2016   Lab Results  Component Value Date   LDLCALC 123 (H) 09/03/2017   LDLCALC 82 09/02/2016   Lab Results  Component  Value Date   TRIG 159 (H) 09/03/2017   TRIG 129 09/02/2016   Lab Results  Component Value Date   CHOLHDL 4.0 09/03/2017   CHOLHDL 3.0 09/02/2016   No results found for: LDLDIRECT   Depression screen [facility] 2/9 09/05/2018 03/08/2018 02/23/2018 08/26/2017 04/27/2017  Decreased Interest 0 0 0 0 0  Down, Depressed, Hopeless _0 0  PHQ - 2 Score _1 0  Altered sleeping 0 1 1 - -  Tired, decreased energy 0 0 0 - -  Change in appetite 0 0 0 - -  Feeling bad or failure about yourself  0 0 0 - -  Trouble concentrating 0 0 0 - -  Moving slowly or fidgety/restless 0 0 0 - -  Suicidal thoughts 0 0 0 - -  PHQ-9 Score _2 - -  Difficult doing work/chores Not difficult at all Not difficult at all Not difficult at all - -   Fall Risk  09/05/2018 05/30/2018 08/26/2017 04/27/2017 03/04/2017  Falls in the past year? 0 Yes No No No  Number falls in past yr: 0 1 - - -  Injury with Fall? 0 Yes - - -    Relevant past medical, surgical, family and social history reviewed Past Medical History:  Diagnosis Date  . Anxiety   . Arthritis    right knee and right elbow  . Cancer ([place])    Cervical CA with partial hysterectomy.  . Cognitive impairment, mild, so stated   . Depression   . Diverticulosis   . Eosinophilic esophagitis 74/04/1447   Biopsy Dec 2018  . Gallstones   . GERD (gastroesophageal reflux disease)   . Hx of cervical cancer 01/30/2016  . Hypertension   . Hypothyroidism   . Sleep apnea    does not use a C-PAP  . Status post partial hysterectomy    Due to Cervical CA  . Vaginal inclusion cyst    Past Surgical History:  Procedure Laterality Date  . ABDOMINAL HYSTERECTOMY     partial hysterectomy  . BREAST BIOPSY Bilateral 01/01/2016   [name] cores. Fibroadenomas  . COLONOSCOPY WITH PROPOFOL N/A 06/27/2015   Procedure: COLONOSCOPY WITH PROPOFOL;  Surgeon: Josefine Class, MD;  Location: [facility] ENDOSCOPY;  Service: Endoscopy;  Laterality: N/A;  . COLONOSCOPY WITH  PROPOFOL N/A  09/23/2017   Procedure: COLONOSCOPY WITH PROPOFOL;  Surgeon: Jonathon Bellows, MD;  Location: [facility] ENDOSCOPY;  Service: Gastroenterology;  Laterality: N/A;  . ESOPHAGOGASTRODUODENOSCOPY (EGD) WITH PROPOFOL N/A 07/22/2017   Procedure: ESOPHAGOGASTRODUODENOSCOPY (EGD) WITH PROPOFOL;  Surgeon: Jonathon Bellows, MD;  Location: [facility] ENDOSCOPY;  Service: Gastroenterology;  Laterality: N/A;  . ESOPHAGOGASTRODUODENOSCOPY (EGD) WITH PROPOFOL N/A 03/31/2018   Procedure: ESOPHAGOGASTRODUODENOSCOPY (EGD) WITH PROPOFOL;  Surgeon: Lin Landsman, MD;  Location: [facility] ENDOSCOPY;  Service: Gastroenterology;  Laterality: N/A;  . KNEE ARTHROSCOPY Right 07/22/2016   Procedure: ARTHROSCOPY KNEE debridement microfracture;  Surgeon: Leanor Kail, MD;  Location: [facility] ORS;  Service: Orthopedics;  Laterality: Right;  . KNEE ARTHROSCOPY WITH MEDIAL MENISECTOMY Left 11/10/2017   Procedure: KNEE ARTHROSCOPY WITH PARTIAL MEDIAL MENISECTOMY&patella femoral debridement, & ablation;  Surgeon: Leanor Kail, MD;  Location: [facility] ORS;  Service: Orthopedics;  Laterality: Left;  . TUBAL LIGATION     Family History  Problem Relation Age of Onset  . Cancer Father        unknown  . Diabetes Mother   . Breast cancer Maternal Aunt   . Cancer Maternal Grandmother        cancer?  . Cancer Paternal Grandmother        cancer?  . Cancer Paternal Grandfather        unknown  . Cirrhosis Cousin    Social History   Tobacco Use  . Smoking status: Never Smoker  . Smokeless tobacco: Never Used  Substance Use Topics  . Alcohol use: No  . Drug use: No     [facility]  AUDIT-C Score  0      Interim medical history since last visit reviewed. Allergies and medications reviewed  Review of Systems  Constitutional: Negative for unexpected weight change.  HENT: Negative for hearing loss.   Eyes: Negative for visual disturbance.       Has cataracts in both eyes  Cardiovascular:  Positive for chest pain (one day hurting over left breast; felt like someone was mashing on her chest; lasted 2 minutes; at rest; nothing similar in past).  Gastrointestinal: Negative for blood in stool.  Endocrine: Negative for cold intolerance and heat intolerance.  Genitourinary: Positive for hematuria.       Had the catheter  Musculoskeletal: Negative for joint swelling.  Skin:       No worrisome moles  Allergic/Immunologic: Negative for food allergies.  Neurological: Negative for tremors.  Hematological: Bruises/bleeds easily (not new).   Per HPI unless specifically indicated above     Objective:    BP 118/78   Pulse 89   Temp 98.2 F (36.8 C) (Oral)   Ht 5' 1.5" (1.562 m)   Wt 168 lb 14.4 oz (76.6 kg)   LMP 05/09/1993 (Approximate)   SpO2 98%   BMI 31.40 kg/m   Wt Readings from Last 3 Encounters:  09/05/18 168 lb 14.4 oz (76.6 kg)  09/02/18 168 lb (76.2 kg)  09/01/18 168 lb (76.2 kg)    Physical Exam Constitutional:      Appearance: Normal appearance. She is well-developed.  HENT:     Head: Normocephalic and atraumatic.     Right Ear: Hearing, tympanic membrane, ear canal and external ear normal.     Left Ear: Hearing, tympanic membrane, ear canal and external ear normal.     Mouth/Throat:     Mouth: Mucous membranes are moist.     Comments: edentulous Eyes:  General: No scleral icterus.       Right eye: No hordeolum.        Left eye: No hordeolum.     Conjunctiva/sclera: Conjunctivae normal.  Neck:     Thyroid: No thyromegaly.     Vascular: No carotid bruit.  Cardiovascular:     Rate and Rhythm: Normal rate and regular rhythm.  No extrasystoles are present.    Heart sounds: Normal heart sounds, S1 normal and S2 normal.  Pulmonary:     Effort: Pulmonary effort is normal. No respiratory distress.     Breath sounds: Normal breath sounds.  Chest:     Breasts: Breasts are symmetrical.        Right: No inverted nipple, mass, nipple discharge, skin change  or tenderness.        Left: No inverted nipple, mass, nipple discharge, skin change or tenderness.  Abdominal:     General: Bowel sounds are normal. There is no distension or abdominal bruit.     Palpations: Abdomen is soft. There is no mass or pulsatile mass.     Tenderness: There is no abdominal tenderness.     Hernia: No hernia is present.  Musculoskeletal: Normal range of motion.     Right knee: No tenderness found.     Left knee: No tenderness found.     Comments: Mild pop upon extension of the left knee  Lymphadenopathy:     Head:     Right side of head: No submandibular adenopathy.     Left side of head: No submandibular adenopathy.     Cervical: No cervical adenopathy.  Skin:    General: Skin is warm and dry.     Coloration: Skin is not pale.     Findings: No bruising or ecchymosis.     Comments: Light-colored scar tissue left knee and left elbow/arm  Neurological:     Mental Status: She is alert.     Cranial Nerves: No cranial nerve deficit.     Motor: No tremor or abnormal muscle tone.     Gait: Gait normal.     Deep Tendon Reflexes:     Reflex Scores:      Patellar reflexes are 2+ on the right side and 2+ on the left side. Psychiatric:        Mood and Affect: Mood is not anxious or depressed.        Speech: Speech normal.        Behavior: Behavior normal.        Thought Content: Thought content normal.     Results for orders placed or performed during the hospital encounter of 09/02/18  Urinalysis, Complete w Microscopic  Result Value Ref Range   Color, Urine AMBER (A) YELLOW   APPearance CLOUDY (A) CLEAR   Specific Gravity, Urine 1.026 1.005 - 1.030   pH 6.0 5.0 - 8.0   Glucose, UA NEGATIVE NEGATIVE mg/dL   Hgb urine dipstick LARGE (A) NEGATIVE   Bilirubin Urine NEGATIVE NEGATIVE   Ketones, ur 5 (A) NEGATIVE mg/dL   Protein, ur 100 (A) NEGATIVE mg/dL   Nitrite NEGATIVE NEGATIVE   Leukocytes, UA MODERATE (A) NEGATIVE   RBC / HPF >50 (H) 0 - 5 RBC/hpf    WBC, UA >50 (H) 0 - 5 WBC/hpf   Bacteria, UA MANY (A) NONE SEEN   Squamous Epithelial / LPF 11-20 0 - 5   Mucus PRESENT    Hyaline Casts, UA PRESENT  Assessment & Plan:   Problem List Items Addressed This Visit      Endocrine   Adult hypothyroidism    Refilled medicine; last TSH reviewed      Relevant Medications   levothyroxine (SYNTHROID, LEVOTHROID) 75 MCG tablet     Other   Preventative health care - Primary    USPSTF grade A and B recommendations reviewed with patient; age-appropriate recommendations, preventive care, screening tests, etc discussed and encouraged; healthy living encouraged; see AVS for patient education given to patient       Relevant Orders   CBC with Differential/Platelet   COMPLETE METABOLIC PANEL WITH GFR   Lipid panel   TSH   Obesity (BMI 30.0-34.9)    Encouraged her to work on weigh tloss; more movement, stay hydrated; avoid sugary drinks; see AVS; try to lose 10 pounds over the next 3 months; BMI 31.4       Other Visit Diagnoses    Family history of breast cancer       Relevant Orders   Ambulatory referral to Genetics   Need for shingles vaccine       first of the two Shingrix vaccines recommended today and she will go to pharmacy for that; return in 2-6 months for 2nd (booster)   Screen for STD (sexually transmitted disease)       check labs today   Relevant Orders   Hepatitis panel, acute   RPR   Cervicovaginal ancillary only   Encounter for screening for HIV       check labs today   Relevant Orders   HIV Antibody (routine testing w rflx)   Chest pain, unspecified type       patient declined referral to cardiologist; EKG today; urged her to call 911 if chest pain recurs   Relevant Orders   EKG 12-Lead   Ambulatory referral to Cardiology   Urinary retention       going to see urologist in January; catheter is out; check urine today; if sx worsen over holidays, return to the ER   Relevant Orders   Urinalysis w microscopic +  reflex cultur   Abnormal EKG       Relevant Orders   Ambulatory referral to Cardiology    12-lead EKG showed T wave inversion lead III, V1, insignificant Q waves QI and aVL; single PVC  Follow up plan: Return in about 1 year (around 09/06/2019) for complete physical.  An after-visit summary was printed and given to the patient at [facility].  Please see the patient instructions which may contain other information and recommendations beyond what is mentioned above in the assessment and plan.  Meds ordered this encounter  Medications  . levothyroxine (SYNTHROID, LEVOTHROID) 75 MCG tablet    Sig: Take 1 tablet (75 mcg total) by mouth daily.    Dispense:  90 tablet    Refill:  1    Orders Placed This Encounter  Procedures  . Hepatitis panel, acute  . HIV Antibody (routine testing w rflx)  . RPR  . CBC with Differential/Platelet  . COMPLETE METABOLIC PANEL WITH GFR  . Lipid panel  . TSH  . Urinalysis w microscopic + reflex cultur  . Ambulatory referral to Genetics  . Ambulatory referral to Cardiology  . EKG 12-Lead

## 2018-09-05 NOTE — Assessment & Plan Note (Signed)
Encouraged her to work on weigh tloss; more movement, stay hydrated; avoid sugary drinks; see AVS; try to lose 10 pounds over the next 3 months; BMI 31.4

## 2018-09-06 ENCOUNTER — Telehealth: Payer: Self-pay

## 2018-09-06 ENCOUNTER — Other Ambulatory Visit: Payer: Self-pay | Admitting: Family Medicine

## 2018-09-06 ENCOUNTER — Other Ambulatory Visit: Payer: Self-pay

## 2018-09-06 ENCOUNTER — Ambulatory Visit: Payer: Self-pay

## 2018-09-06 DIAGNOSIS — R339 Retention of urine, unspecified: Secondary | ICD-10-CM

## 2018-09-06 DIAGNOSIS — Z598 Other problems related to housing and economic circumstances: Secondary | ICD-10-CM

## 2018-09-06 DIAGNOSIS — R740 Nonspecific elevation of levels of transaminase and lactic acid dehydrogenase [LDH]: Principal | ICD-10-CM

## 2018-09-06 DIAGNOSIS — I10 Essential (primary) hypertension: Secondary | ICD-10-CM

## 2018-09-06 DIAGNOSIS — Z599 Problem related to housing and economic circumstances, unspecified: Secondary | ICD-10-CM

## 2018-09-06 DIAGNOSIS — R7401 Elevation of levels of liver transaminase levels: Secondary | ICD-10-CM

## 2018-09-06 LAB — CERVICOVAGINAL ANCILLARY ONLY
CHLAMYDIA, DNA PROBE: NEGATIVE
Neisseria Gonorrhea: NEGATIVE
Trichomonas: NEGATIVE

## 2018-09-06 MED ORDER — NITROFURANTOIN MONOHYD MACRO 100 MG PO CAPS: 100.0000 mg | ORAL_CAPSULE | Freq: Two times a day (BID) | ORAL | 0 refills | Status: DC

## 2018-09-06 NOTE — Telephone Encounter (Signed)
Referral placed, pt notified. 

## 2018-09-06 NOTE — Telephone Encounter (Signed)
I put in a referral yesterday for her to see a cardiologist; it's in the referral tab I'll respond to labs in the lab section

## 2018-09-06 NOTE — Progress Notes (Signed)
Kristina Reed, please let the patient know that her syphilis is negative She has 3+ blood in her urine, so she needs to see urologist ASAP Start ABX One liver enzyme high, so no alcohol; recheck in 6 weeks (ORDER hepatic function panel)

## 2018-09-06 NOTE — Telephone Encounter (Signed)
Copied from [place] [id number]. Topic: General - Other >> Sep 06, 2018  8:54 AM Carolyn Stare wrote:  Pt call and ask about her lab results and ask since her ekg was not normal do she need to see a cardiologist     [phone]

## 2018-09-06 NOTE — Chronic Care Management (AMB) (Signed)
  Care Management   Note  09/06/2018 Name: Kristina Reed MRN: [id number] DOB: 27-Sep-1965   [name] is a 52 y.o. year old female who sees Dr. Enid Derry for primary care. Dr. Sanda Klein asked the CCM team to consult the patient for assistance with chronic disease management related to HTN, OSA, anxiety and depression, and financial difficulties related to medication affordibility. [name] was seen by Dr. Sanda Klein on 09/05/18. She had a positive UTI and needed assistance with medication affordability as she states she could not afford a 3.00 antibiotic prescription. CCM RN CM was consulted this morning for possible assistance.   CCM RN CM was unable to reach [name] this morning via telephone. HIPAA compliant VM message left requesting a return call.  Plan: Will follow up on Thursday    Maybelline Kolarik E. Rollene Rotunda, RN, BSN Nurse Care Coordinator [facility] Care Management  [phone]

## 2018-09-06 NOTE — Telephone Encounter (Signed)
-----   Message from [name], MD sent at 09/06/2018  9:36 AM EST ----- Joelene Millin, please let the patient know that her syphilis is negative She has 3+ blood in her urine, so she needs to see urologist ASAP Start ABX One liver enzyme high, so no alcohol; recheck in 6 weeks (ORDER hepatic function panel)

## 2018-09-08 ENCOUNTER — Ambulatory Visit: Payer: Self-pay | Admitting: Pharmacist

## 2018-09-08 DIAGNOSIS — Z599 Problem related to housing and economic circumstances, unspecified: Secondary | ICD-10-CM

## 2018-09-08 DIAGNOSIS — Z598 Other problems related to housing and economic circumstances: Secondary | ICD-10-CM

## 2018-09-08 DIAGNOSIS — R339 Retention of urine, unspecified: Secondary | ICD-10-CM

## 2018-09-08 LAB — URINE CULTURE
MICRO NUMBER:: 91538488
SPECIMEN QUALITY:: ADEQUATE

## 2018-09-08 LAB — URINALYSIS W MICROSCOPIC + REFLEX CULTURE
Bacteria, UA: NONE SEEN /HPF
Bilirubin Urine: NEGATIVE
Glucose, UA: NEGATIVE
Hyaline Cast: NONE SEEN /LPF
Ketones, ur: NEGATIVE
Nitrites, Initial: NEGATIVE
Specific Gravity, Urine: 1.023 (ref 1.001–1.03)
WBC, UA: >=60 /HPF — AB (ref 0–5)
pH: 6.5 (ref 5.0–8.0)

## 2018-09-08 LAB — RPR: RPR Ser Ql: NONREACTIVE

## 2018-09-08 LAB — COMPLETE METABOLIC PANEL WITH GFR
AG Ratio: 1.4 (calc) (ref 1.0–2.5)
ALBUMIN MSPROF: 4 g/dL (ref 3.6–5.1)
ALT: 35 U/L — ABNORMAL HIGH (ref 6–29)
AST: 27 U/L (ref 10–35)
Alkaline phosphatase (APISO): 78 U/L (ref 33–130)
BUN: 10 mg/dL (ref 7–25)
CHLORIDE: 102 mmol/L (ref 98–110)
CO2: 31 mmol/L (ref 20–32)
Calcium: 9.2 mg/dL (ref 8.6–10.4)
Creat: 0.72 mg/dL (ref 0.50–1.05)
GFR, Est African American: 113 mL/min/{1.73_m2} (ref >=60)
GFR, Est Non African American: 98 mL/min/{1.73_m2} (ref >=60)
Globulin: 2.9 g/dL (calc) (ref 1.9–3.7)
Glucose, Bld: 72 mg/dL (ref 65–99)
Potassium: 3.9 mmol/L (ref 3.5–5.3)
Sodium: 137 mmol/L (ref 135–146)
Total Bilirubin: 0.3 mg/dL (ref 0.2–1.2)
Total Protein: 6.9 g/dL (ref 6.1–8.1)

## 2018-09-08 LAB — CBC WITH DIFFERENTIAL/PLATELET
Absolute Monocytes: 462 cells/uL (ref 200–950)
BASOS PCT: 0.8 %
Basophils Absolute: 52 cells/uL (ref 0–200)
EOS ABS: 657 {cells}/uL — AB (ref 15–500)
Eosinophils Relative: 10.1 %
HCT: 35.6 % (ref 35.0–45.0)
HEMOGLOBIN: 12.2 g/dL (ref 11.7–15.5)
Lymphs Abs: 1996 cells/uL (ref 850–3900)
MCH: 29 pg (ref 27.0–33.0)
MCHC: 34.3 g/dL (ref 32.0–36.0)
MCV: 84.8 fL (ref 80.0–100.0)
MPV: 10.5 fL (ref 7.5–12.5)
Monocytes Relative: 7.1 %
Neutro Abs: 3335 cells/uL (ref 1500–7800)
Neutrophils Relative %: 51.3 %
Platelets: 231 10*3/uL (ref 140–400)
RBC: 4.2 10*6/uL (ref 3.80–5.10)
RDW: 12.4 % (ref 11.0–15.0)
Total Lymphocyte: 30.7 %
WBC: 6.5 10*3/uL (ref 3.8–10.8)

## 2018-09-08 LAB — HEPATITIS PANEL, ACUTE
Hep A IgM: NONREACTIVE
Hep B C IgM: NONREACTIVE
Hepatitis B Surface Ag: NONREACTIVE
Hepatitis C Ab: NONREACTIVE
SIGNAL TO CUT-OFF: 0.02 (ref <1.00)

## 2018-09-08 LAB — LIPID PANEL
CHOL/HDL RATIO: 4 (calc) (ref <5.0)
Cholesterol: 186 mg/dL (ref <200)
HDL: 46 mg/dL — ABNORMAL LOW (ref >50)
LDL CHOLESTEROL (CALC): 114 mg/dL — AB
Non-HDL Cholesterol (Calc): 140 mg/dL (calc) — ABNORMAL HIGH (ref <130)
Triglycerides: 144 mg/dL (ref <150)

## 2018-09-08 LAB — HIV ANTIBODY (ROUTINE TESTING W REFLEX): HIV 1&2 Ab, 4th Generation: NONREACTIVE

## 2018-09-08 LAB — TSH: TSH: 4.16 mIU/L

## 2018-09-08 LAB — CULTURE INDICATED

## 2018-09-08 NOTE — Chronic Care Management (AMB) (Signed)
  Care Management   Note  09/08/2018 Name: Kristina Reed MRN: [id number] DOB: July 25, 1966    52 y.o. year old female referred to Chronic Care Management by Dr. Sanda Klein for medication assistance (Macrobid). Last office visit with [name], MD was 09/05/18.   Of note, patient was seen in the [facility] ED 12/18, 12/19, and 12/20 for placement and then removal of Foley catheter 2/2 to urinary retention.   Based on urinalysis, Dr. Sanda Klein has prescribed a course of Marcrobid for patient. Copay is $3, which patient is unable to afford at this time. Unfortunately, there are no cash programs available for [employer]. Patient has Medicaid and is ineligible for copay assistance programs.   Was unable to reach patient via telephone today and have left HIPAA compliant voicemail asking patient to return my call. (unsuccessful outreach #2).  Plan: Will follow-up within 3-5  business days via telephone.   Ruben Reason, PharmD Clinical Pharmacist [facility] [phone]

## 2018-09-08 NOTE — Progress Notes (Signed)
Kristina Reed, please let the patient know that her tests were negative for viral hepatitis, HIV, syphilis, gonorrhea, chlamydia, and trich; good news; thank you

## 2018-09-12 ENCOUNTER — Ambulatory Visit: Payer: Self-pay | Admitting: Pharmacist

## 2018-09-12 DIAGNOSIS — Z599 Problem related to housing and economic circumstances, unspecified: Secondary | ICD-10-CM

## 2018-09-12 DIAGNOSIS — R339 Retention of urine, unspecified: Secondary | ICD-10-CM

## 2018-09-12 DIAGNOSIS — Z598 Other problems related to housing and economic circumstances: Secondary | ICD-10-CM

## 2018-09-12 NOTE — Chronic Care Management (AMB) (Signed)
  Care Management   Note  09/12/2018 Name: Kristina Reed MRN: [id number] DOB: 11-28-1965   [name] is a 52 y.o. year old female who sees Lada, Satira Anis, MD for primary care. Dr. Sanda Klein asked the CCM team to consult the patient for assistance with chronic disease management related to financial difficulties. Referral was placed 09/06/18. Telephone outreach to patient today to introduce care management services.   Of note, patient was seen in the [facility] ED 12/18, 12/19, and 12/20 for placement and then removal of Foley catheter 2/2 to urinary retention.   Based on urinalysis, Dr. Sanda Klein has prescribed a course of Marcrobid for patient. Copay is $3, which patient is unable to afford at this time. Unfortunately, there are no cash programs available for [employer]. Patient has Medicaid and is ineligible for copay assistance programs.    Plan: Patient states that she picked up the [place] last week and has finished the course of antibiotic. Provided patient with care management contact information should she have any needs in the future. Care Management will not open a case at this time.   Ruben Reason, PharmD Clinical Pharmacist [facility] [phone]

## 2018-09-15 ENCOUNTER — Telehealth: Payer: Self-pay | Admitting: Genetics

## 2018-09-15 ENCOUNTER — Encounter: Payer: Self-pay | Admitting: Genetics

## 2018-09-15 NOTE — Telephone Encounter (Signed)
A genetic counseling appt has been scheduled for the pt to see Ferol Luz on 1/28 at 11am. Letter mailed to the pt.

## 2018-09-22 ENCOUNTER — Ambulatory Visit (INDEPENDENT_AMBULATORY_CARE_PROVIDER_SITE_OTHER): Payer: Medicaid Other | Admitting: Urology

## 2018-09-22 ENCOUNTER — Encounter: Payer: Self-pay | Admitting: Urology

## 2018-09-22 VITALS — BP 154/91 | HR 80 | Ht 61.0 in | Wt 171.0 lb

## 2018-09-22 DIAGNOSIS — R339 Retention of urine, unspecified: Secondary | ICD-10-CM

## 2018-09-22 LAB — URINALYSIS, COMPLETE
Bilirubin, UA: NEGATIVE
Glucose, UA: NEGATIVE
Ketones, UA: NEGATIVE
Nitrite, UA: NEGATIVE
PH UA: 5.5 (ref 5.0–7.5)
Protein, UA: NEGATIVE
RBC, UA: NEGATIVE
Specific Gravity, UA: 1.02 (ref 1.005–1.030)
Urobilinogen, Ur: 0.2 mg/dL (ref 0.2–1.0)

## 2018-09-22 LAB — MICROSCOPIC EXAMINATION
BACTERIA UA: NONE SEEN
RBC, UA: NONE SEEN /hpf (ref 0–2)

## 2018-09-22 LAB — BLADDER SCAN AMB NON-IMAGING

## 2018-09-22 NOTE — Progress Notes (Signed)
09/22/2018 11:09 AM   Kristina Reed May 07, 1966 [id number]  Referring provider: [name], MD [address] [place], [place] [address]  CC: Urinary retention  HPI: I saw Kristina Reed in urology clinic today for urinary retention.  Her past medical history is notable for anxiety, depression, and cervical cancer and partial hysterectomy.  She reports she has had around 5 episodes of acute urinary retention of unclear etiology over the last 30 years that required catheter placement.  Her most recent episode was 08/31/2018 when she presented to the ER with pelvic discomfort and inability to void.  Her bladder scan was only 100 cc, however a Foley catheter was placed.  Urinalysis and labs were negative at that time.  She returned to the ER the next day with leakage around the catheter, and ultimately the catheter was removed on 09/02/2018 secondary to her significant discomfort with the catheter and she was able to void spontaneously without issue.  Denies any history of gross hematuria or flank pain.  She denies any voiding symptoms at baseline including urgency, frequency, or incontinence.  She denies any numbness or tingling, or changes in her vision.  She denies weight loss, fevers, or chills.  There are no inciting events like pain medications or alcohol.  There are no aggravating or alleviating factors.  Severity is moderate.  Postvoid residual 13 cc.   PMH: Past Medical History:  Diagnosis Date  . Anxiety   . Arthritis    right knee and right elbow  . Cancer ([place])    Cervical CA with partial hysterectomy.  . Cognitive impairment, mild, so stated   . Depression   . Diverticulosis   . Eosinophilic esophagitis 81/85/6314   Biopsy Dec 2018  . Gallstones   . GERD (gastroesophageal reflux disease)   . Hx of cervical cancer 01/30/2016  . Hypertension   . Hypothyroidism   . Sleep apnea    does not use a C-PAP  . Status post partial hysterectomy    Due to Cervical CA  .  Vaginal inclusion cyst     Surgical History: Past Surgical History:  Procedure Laterality Date  . ABDOMINAL HYSTERECTOMY     partial hysterectomy  . BREAST BIOPSY Bilateral 01/01/2016   [name] cores. Fibroadenomas  . COLONOSCOPY WITH PROPOFOL N/A 06/27/2015   Procedure: COLONOSCOPY WITH PROPOFOL;  Surgeon: Josefine Class, MD;  Location: [facility] ENDOSCOPY;  Service: Endoscopy;  Laterality: N/A;  . COLONOSCOPY WITH PROPOFOL N/A 09/23/2017   Procedure: COLONOSCOPY WITH PROPOFOL;  Surgeon: Jonathon Bellows, MD;  Location: [facility] ENDOSCOPY;  Service: Gastroenterology;  Laterality: N/A;  . ESOPHAGOGASTRODUODENOSCOPY (EGD) WITH PROPOFOL N/A 07/22/2017   Procedure: ESOPHAGOGASTRODUODENOSCOPY (EGD) WITH PROPOFOL;  Surgeon: Jonathon Bellows, MD;  Location: [facility] ENDOSCOPY;  Service: Gastroenterology;  Laterality: N/A;  . ESOPHAGOGASTRODUODENOSCOPY (EGD) WITH PROPOFOL N/A 03/31/2018   Procedure: ESOPHAGOGASTRODUODENOSCOPY (EGD) WITH PROPOFOL;  Surgeon: Lin Landsman, MD;  Location: [facility] ENDOSCOPY;  Service: Gastroenterology;  Laterality: N/A;  . KNEE ARTHROSCOPY Right 07/22/2016   Procedure: ARTHROSCOPY KNEE debridement microfracture;  Surgeon: Leanor Kail, MD;  Location: [facility] ORS;  Service: Orthopedics;  Laterality: Right;  . KNEE ARTHROSCOPY WITH MEDIAL MENISECTOMY Left 11/10/2017   Procedure: KNEE ARTHROSCOPY WITH PARTIAL MEDIAL MENISECTOMY&patella femoral debridement, & ablation;  Surgeon: Leanor Kail, MD;  Location: [facility] ORS;  Service: Orthopedics;  Laterality: Left;  . TUBAL LIGATION       Allergies:  Allergies  Allergen Reactions  . Percocet [Oxycodone-Acetaminophen] Palpitations    Family History: Family  History  Problem Relation Age of Onset  . Cancer Father        unknown  . Diabetes Mother   . Breast cancer Maternal Aunt   . Cancer Maternal Grandmother        cancer?  . Cancer Paternal Grandmother        cancer?  . Cancer Paternal Grandfather        unknown  . Cirrhosis Cousin      Social History:  reports that she has never smoked. She has never used smokeless tobacco. She reports that she does not drink alcohol or use drugs.  ROS: Please see flowsheet from today's date for complete review of systems.  Physical Exam: BP (!) 154/91 (BP Location: Left Arm, Patient Position: Sitting)   Pulse 80   Ht '5\' 1"'  (1.549 m)   Wt 171 lb (77.6 kg)   LMP 05/09/1993 (Approximate)   BMI 32.31 kg/m    Constitutional:  Alert and oriented, No acute distress. Cardiovascular: No clubbing, cyanosis, or edema. Respiratory: Normal respiratory effort, no increased work of breathing. GI: Abdomen is soft, nontender, nondistended, no abdominal masses GU: No CVA tenderness Lymph: No cervical or inguinal lymphadenopathy. Skin: No rashes, bruises or suspicious lesions. Neurologic: Grossly intact, no focal deficits, moving all 4 extremities. Psychiatric: Normal mood and affect.  Laboratory Data: Reviewed  Serum creatinine 0.72, eGFR greater than 60  Pertinent Imaging: No recent cross-sectional imaging to review  Assessment & Plan:   In summary, Kristina Reed is a 53 year old female with multiple reported episodes of prior urinary retention requiring catheter placement.  Her most recent episode was 08/31/2018, and a catheter was placed for a bladder scan of only 100 cc.  She denies any significant urinary symptoms at baseline.  I am not convinced this most recent episode was true urinary retention with such a low bladder scan, and she is emptying well in clinic today with low PVR.  I discussed this with her at length.  She has no other concerning symptoms for multiple sclerosis, overflow incontinence, or that would warrant further work-up with cystoscopy or cross-sectional imaging.  -The patient was taught clean intermittent catheterization and given free supplies.  If she finds herself unable to void with pelvic pressure or pain, she can catheterize herself at home to empty her bladder  instead of presenting to the ER for foley placement. -Follow-up in 1 year for symptom check, PVR  Billey Co, MD  [facility] [address] [place], [place] [address] [phone]

## 2018-09-22 NOTE — Progress Notes (Signed)
Continuous Intermittent Catheterization  Due to intermittent retention patient is present today for a teaching of self I & O Catheterization. Patient was given detailed verbal and printed instructions of self catheterization. Patient was cleaned and prepped in a sterile fashion.  With instruction patient did not wish to preform cath today and verbalized instruction understanding. Patient was given a sample bag with supplies to take home.  Patient is to only use if she is unable to urinate and should contact the office if this happens.  Preformed by: Fonnie Jarvis, CMA

## 2018-10-11 ENCOUNTER — Encounter: Payer: Self-pay | Admitting: Genetics

## 2018-10-11 ENCOUNTER — Other Ambulatory Visit: Payer: Self-pay

## 2018-10-11 ENCOUNTER — Telehealth: Payer: Self-pay | Admitting: Genetics

## 2018-10-11 NOTE — Telephone Encounter (Signed)
A genetic counseling appt has been scheduled for the pt to see Ferol Luz on 2/10 at 1pm. Aware to arrive 30 minutes early. Letter mailed.

## 2018-10-13 ENCOUNTER — Other Ambulatory Visit: Payer: Self-pay

## 2018-10-13 ENCOUNTER — Ambulatory Visit
Payer: Medicaid Other | Attending: Student in an Organized Health Care Education/Training Program | Admitting: Student in an Organized Health Care Education/Training Program

## 2018-10-13 ENCOUNTER — Encounter: Payer: Self-pay | Admitting: Student in an Organized Health Care Education/Training Program

## 2018-10-13 VITALS — BP 130/96 | HR 85 | Temp 98.0°F | Resp 18 | Ht 61.5 in | Wt 169.4 lb

## 2018-10-13 DIAGNOSIS — G8929 Other chronic pain: Secondary | ICD-10-CM | POA: Insufficient documentation

## 2018-10-13 DIAGNOSIS — M25561 Pain in right knee: Secondary | ICD-10-CM | POA: Diagnosis not present

## 2018-10-13 DIAGNOSIS — M170 Bilateral primary osteoarthritis of knee: Secondary | ICD-10-CM | POA: Insufficient documentation

## 2018-10-13 DIAGNOSIS — G894 Chronic pain syndrome: Secondary | ICD-10-CM

## 2018-10-13 DIAGNOSIS — M25562 Pain in left knee: Secondary | ICD-10-CM | POA: Diagnosis not present

## 2018-10-13 NOTE — Progress Notes (Signed)
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  Action 1 (Across chest): deferred Action 2 (Overhead): deferred Action  3 (LB reach): deferred   Provocative Test(s):  Phalen's test: deferred Tinel's test: deferred Apley's scratch test (touch opposite shoulder):  Action 1 (Across chest): deferred Action 2 (Overhead): deferred Action 3 (LB reach): deferred    Thoracic Spine Area Exam  Skin & Axial Inspection: No masses, redness, or swelling Alignment: Symmetrical Functional ROM: Decreased ROM Stability: No instability detected Muscle Tone/Strength: Functionally intact. No obvious neuro-muscular anomalies detected. Sensory (Neurological): Unimpaired Muscle strength & Tone: No palpable anomalies  Lumbar Spine Area Exam  Skin & Axial Inspection: No masses, redness, or swelling Alignment: Symmetrical Functional ROM:  Unrestricted ROM       Stability: No instability detected Muscle Tone/Strength: Functionally intact. No obvious neuro-muscular anomalies detected. Sensory (Neurological): Unimpaired Palpation: No palpable anomalies       Provocative Tests: Hyperextension/rotation test: deferred today       Lumbar quadrant test (Kemp's test): deferred today       Lateral bending test: deferred today       Patrick's Maneuver: deferred today                   FABER* test: deferred today                   S-I anterior distraction/compression test: deferred today         S-I lateral compression test: deferred today         S-I Thigh-thrust test: deferred today         S-I Gaenslen's test: deferred today         *(Flexion, ABduction and External Rotation)  Gait & Posture Assessment  Ambulation: Unassisted Gait: Relatively normal for age and body habitus Posture: WNL   Lower Extremity Exam    Side: Right lower extremity  Side: Left lower extremity  Stability: No instability observed          Stability: No instability observed          Skin & Extremity Inspection: Skin color, temperature, and hair growth are WNL. No peripheral edema or cyanosis. No masses, redness, swelling, asymmetry, or associated skin lesions. No contractures.  Skin & Extremity Inspection: Skin color, temperature, and hair growth are WNL. No peripheral edema or cyanosis. No masses, redness, swelling, asymmetry, or associated skin lesions. No contractures.  Functional ROM: Decreased ROM for knee joint          Functional ROM: Decreased ROM for knee joint          Muscle Tone/Strength: Functionally intact. No obvious neuro-muscular anomalies detected.  Muscle Tone/Strength: Functionally intact. No obvious neuro-muscular anomalies detected.  Sensory (Neurological): Arthropathic arthralgia        Sensory (Neurological): Arthropathic arthralgia        DTR: Patellar: 2+: normal Achilles: deferred today Plantar: deferred today  DTR: Patellar:  2+: normal Achilles: deferred today Plantar: deferred today  Palpation: No palpable anomalies  Palpation: No palpable anomalies   Assessment  Primary Diagnosis & Pertinent Problem List: The primary encounter diagnosis was Bilateral primary osteoarthritis of knee. Diagnoses of Chronic pain of both knees and Chronic pain syndrome were also pertinent to this visit.  Visit Diagnosis (New problems to examiner): 1. Bilateral primary osteoarthritis of knee   2. Chronic pain of both knees   3. Chronic pain syndrome    Plan of Care (Initial workup plan)   53 year old female with a history of chronic bilateral knee pain status post intra-articular steroid injections, Visco supplementation being referred here from orthopedics for consideration  of genicular nerve block and possible radiofrequency ablation.  Risks and benefits of this procedure were discussed with the patient in great detail.  Patient would like to proceed.    Ordered Lab-work, Procedure(s), Referral(s), & Consult(s): Orders Placed This Encounter  Procedures  . GENICULAR NERVE BLOCK     Interventional management options: Kristina Reed was informed that there is no guarantee that she would be a candidate for interventional therapies. The decision will be based on the results of diagnostic studies, as well as Kristina Reed's risk profile.  Procedure(s) under consideration:  Genicular nerve block, possible radiofrequency ablation   Provider-requested follow-up: Return in about 2 weeks (around 10/27/2018) for Procedure.  Future Appointments  Date Time Provider [place]  10/24/2018  2:00 PM Tana Felts [facility] None  10/24/2018  3:00 PM [facility] None  10/26/2018  9:30 AM Gillis Santa, MD [facility] None  11/14/2018 11:20 AM Minna Merritts, MD [facility]  09/11/2019  9:00 AM [name], MD [facility]  09/25/2019  9:45 AM Diamantina Providence, Herbert Seta, MD [facility] None    Primary Care Physician:  [name], MD Location: [facility] Note by: Gillis Santa, M.D, Date: 10/13/2018; Time: 1:25 PM  Patient Instructions   GENERAL RISKS AND COMPLICATIONS  What are the risk, side effects and possible complications? Generally speaking, most procedures are safe.  However, with any procedure there are risks, side effects, and the possibility of complications.  The risks and complications are dependent upon the sites that are lesioned, or the type of nerve block to be performed.  The closer the procedure is to the spine, the more serious the risks are.  Great care is taken when placing the radio frequency needles, block needles or lesioning probes, but sometimes complications can occur. 1. Infection: Any time there is an injection through the skin, there is a risk of infection.  This is why sterile conditions are used for these blocks.  There are four possible types of infection. 1. Localized skin infection. 2. Central Nervous System Infection-This can be in the form of Meningitis, which can be deadly. 3. Epidural Infections-This can be in the form of an epidural abscess, which can cause pressure inside of the spine, causing compression of the spinal cord with subsequent paralysis. This would require an emergency surgery to decompress, and there are no guarantees that the patient would recover from the paralysis. 4. Discitis-This is an infection of the intervertebral discs.  It occurs in about 1% of discography procedures.  It is difficult to treat and it may lead to surgery.        2. Pain: the needles have to go through skin and soft tissues, will cause soreness.       3. Damage to internal structures:  The nerves to be lesioned may be near blood vessels or    other nerves which can be potentially damaged.       4. Bleeding: Bleeding is more common if the patient is taking blood thinners such as  aspirin, Coumadin, Ticiid, Plavix, etc., or if he/she have some genetic  predisposition  such as hemophilia. Bleeding into the spinal canal can cause compression of the spinal  cord with subsequent paralysis.  This would require an emergency surgery to  decompress and there are no guarantees that the patient would recover from the  paralysis.       5. Pneumothorax:  Puncturing of a lung is a possibility, every time  a needle is introduced in  the area of the chest or upper back.  Pneumothorax refers to free air around the  collapsed lung(s), inside of the thoracic cavity (chest cavity).  Another two possible  complications related to a similar event would include: Hemothorax and Chylothorax.   These are variations of the Pneumothorax, where instead of air around the collapsed  lung(s), you may have blood or chyle, respectively.       6. Spinal headaches: They may occur with any procedures in the area of the spine.       7. Persistent CSF (Cerebro-Spinal Fluid) leakage: This is a rare problem, but may occur  with prolonged intrathecal or epidural catheters either due to the formation of a fistulous  track or a dural tear.       8. Nerve damage: By working so close to the spinal cord, there is always a possibility of  nerve damage, which could be as serious as a permanent spinal cord injury with  paralysis.       9. Death:  Although rare, severe deadly allergic reactions known as "Anaphylactic  reaction" can occur to any of the medications used.      10. Worsening of the symptoms:  We can always make thing worse.  What are the chances of something like this happening? Chances of any of this occuring are extremely low.  By statistics, you have more of a chance of getting killed in a motor vehicle accident: while driving to the hospital than any of the above occurring .  Nevertheless, you should be aware that they are possibilities.  In general, it is similar to taking a shower.  Everybody knows that you can slip, hit your head and get killed.  Does that mean that you should not  shower again?  Nevertheless always keep in mind that statistics do not mean anything if you happen to be on the wrong side of them.  Even if a procedure has a 1 (one) in a 1,000,000 (million) chance of going wrong, it you happen to be that one..Also, keep in mind that by statistics, you have more of a chance of having something go wrong when taking medications.  Who should not have this procedure? If you are on a blood thinning medication (e.g. Coumadin, Plavix, see list of "Blood Thinners"), or if you have an active infection going on, you should not have the procedure.  If you are taking any blood thinners, please inform your physician.  How should I prepare for this procedure?  Do not eat or drink anything at least six hours prior to the procedure.  Bring a driver with you .  It cannot be a taxi.  Come accompanied by an adult that can drive you back, and that is strong enough to help you if your legs get weak or numb from the local anesthetic.  Take all of your medicines the morning of the procedure with just enough water to swallow them.  If you have diabetes, make sure that you are scheduled to have your procedure done first thing in the morning, whenever possible.  If you have diabetes, take only half of your insulin dose and notify our nurse that you have done so as soon as you arrive at the clinic.  If you are diabetic, but only take blood sugar pills (oral hypoglycemic), then do not take them on the morning of your procedure.  You may take them after you have had the procedure.  Do not take aspirin or any aspirin-containing medications, at least eleven (11) days prior to the procedure.  They may prolong bleeding.  Wear loose fitting clothing that may be easy to take off and that you would not mind if it got stained with Betadine or blood.  Do not wear any jewelry or perfume  Remove any nail coloring.  It will interfere with some of our monitoring equipment.  NOTE: Remember that  this is not meant to be interpreted as a complete list of all possible complications.  Unforeseen problems may occur.  BLOOD THINNERS The following drugs contain aspirin or other products, which can cause increased bleeding during surgery and should not be taken for 2 weeks prior to and 1 week after surgery.  If you should need take something for relief of minor pain, you may take acetaminophen which is found in Tylenol,m Datril, Anacin-3 and Panadol. It is not blood thinner. The products listed below are.  Do not take any of the products listed below in addition to any listed on your instruction sheet.  A.P.C or A.P.C with Codeine Codeine Phosphate Capsules #3 Ibuprofen Ridaura  ABC compound Congesprin Imuran rimadil  Advil Cope Indocin Robaxisal  Alka-Seltzer Effervescent Pain Reliever and Antacid Coricidin or Coricidin-D  Indomethacin Rufen  Alka-Seltzer plus Cold Medicine Cosprin Ketoprofen S-A-C Tablets  Anacin Analgesic Tablets or Capsules Coumadin Korlgesic Salflex  Anacin Extra Strength Analgesic tablets or capsules CP-2 Tablets Lanoril Salicylate  Anaprox Cuprimine Capsules Levenox Salocol  Anexsia-D Dalteparin Magan Salsalate  Anodynos Darvon compound Magnesium Salicylate Sine-off  Ansaid Dasin Capsules Magsal Sodium Salicylate  Anturane Depen Capsules Marnal Soma  APF Arthritis pain formula Dewitt's Pills Measurin Stanback  Argesic Dia-Gesic Meclofenamic Sulfinpyrazone  Arthritis Bayer Timed Release Aspirin Diclofenac Meclomen Sulindac  Arthritis pain formula Anacin Dicumarol Medipren Supac  Analgesic (Safety coated) Arthralgen Diffunasal Mefanamic Suprofen  Arthritis Strength Bufferin Dihydrocodeine Mepro Compound Suprol  Arthropan liquid Dopirydamole Methcarbomol with Aspirin Synalgos  ASA tablets/Enseals Disalcid Micrainin Tagament  Ascriptin Doan's Midol Talwin  Ascriptin A/D Dolene Mobidin Tanderil  Ascriptin Extra Strength Dolobid Moblgesic Ticlid  Ascriptin with Codeine  Doloprin or Doloprin with Codeine Momentum Tolectin  Asperbuf Duoprin Mono-gesic Trendar  Aspergum Duradyne Motrin or Motrin IB Triminicin  Aspirin plain, buffered or enteric coated Durasal Myochrisine Trigesic  Aspirin Suppositories Easprin Nalfon Trillsate  Aspirin with Codeine Ecotrin Regular or Extra Strength Naprosyn Uracel  Atromid-S Efficin Naproxen Ursinus  Auranofin Capsules Elmiron Neocylate Vanquish  Axotal Emagrin Norgesic Verin  Azathioprine Empirin or Empirin with Codeine Normiflo Vitamin E  Azolid Emprazil Nuprin Voltaren  Bayer Aspirin plain, buffered or children's or timed BC Tablets or powders Encaprin Orgaran Warfarin Sodium  Buff-a-Comp Enoxaparin Orudis Zorpin  Buff-a-Comp with Codeine Equegesic Os-Cal-Gesic   Buffaprin Excedrin plain, buffered or Extra Strength Oxalid   Bufferin Arthritis Strength Feldene Oxphenbutazone   Bufferin plain or Extra Strength Feldene Capsules Oxycodone with Aspirin   Bufferin with Codeine Fenoprofen Fenoprofen Pabalate or Pabalate-SF   Buffets II Flogesic Panagesic   Buffinol plain or Extra Strength Florinal or Florinal with Codeine Panwarfarin   Buf-Tabs Flurbiprofen Penicillamine   Butalbital Compound Four-way cold tablets Penicillin   Butazolidin Fragmin Pepto-Bismol   Carbenicillin Geminisyn Percodan   Carna Arthritis Reliever Geopen Persantine   Carprofen Gold's salt Persistin   Chloramphenicol Goody's Phenylbutazone   Chloromycetin Haltrain Piroxlcam   Clmetidine heparin Plaquenil   Cllnoril Hyco-pap Ponstel   Clofibrate Hydroxy chloroquine Propoxyphen         Before stopping  any of these medications, be sure to consult the physician who ordered them.  Some, such as Coumadin (Warfarin) are ordered to prevent or treat serious conditions such as "deep thrombosis", "pumonary embolisms", and other heart problems.  The amount of time that you may need off of the medication may also vary with the medication and the reason for which  you were taking it.  If you are taking any of these medications, please make sure you notify your pain physician before you undergo any procedures.         Moderate Conscious Sedation, Adult Sedation is the use of medicines to promote relaxation and relieve discomfort and anxiety. Moderate conscious sedation is a type of sedation. Under moderate conscious sedation, you are less alert than normal, but you are still able to respond to instructions, touch, or both. Moderate conscious sedation is used during short medical and dental procedures. It is milder than deep sedation, which is a type of sedation under which you cannot be easily woken up. It is also milder than general anesthesia, which is the use of medicines to make you unconscious. Moderate conscious sedation allows you to return to your regular activities sooner. Tell a health care provider about:  Any allergies you have.  All medicines you are taking, including vitamins, herbs, eye drops, creams, and over-the-counter medicines.  Use of steroids (by mouth or creams).  Any problems you or family members have had with sedatives and anesthetic medicines.  Any blood disorders you have.  Any surgeries you have had.  Any medical conditions you have, such as sleep apnea.  Whether you are pregnant or may be pregnant.  Any use of cigarettes, alcohol, marijuana, or street drugs. What are the risks? Generally, this is a safe procedure. However, problems may occur, including:  Getting too much medicine (oversedation).  Nausea.  Allergic reaction to medicines.  Trouble breathing. If this happens, a breathing tube may be used to help with breathing. It will be removed when you are awake and breathing on your own.  Heart trouble.  Lung trouble. What happens before the procedure? Staying hydrated Follow instructions from your health care provider about hydration, which may include:  Up to 2 hours before the procedure - you  may continue to drink clear liquids, such as water, clear fruit juice, black coffee, and plain tea. Eating and drinking restrictions Follow instructions from your health care provider about eating and drinking, which may include:  8 hours before the procedure - stop eating heavy meals or foods such as meat, fried foods, or fatty foods.  6 hours before the procedure - stop eating light meals or foods, such as toast or cereal.  6 hours before the procedure - stop drinking milk or drinks that contain milk.  2 hours before the procedure - stop drinking clear liquids. Medicine Ask your health care provider about:  Changing or stopping your regular medicines. This is especially important if you are taking diabetes medicines or blood thinners.  Taking medicines such as aspirin and ibuprofen. These medicines can thin your blood. Do not take these medicines before your procedure if your health care provider instructs you not to.  Tests and exams  You will have a physical exam.  You may have blood tests done to show: ? How well your kidneys and liver are working. ? How well your blood can clot. General instructions  Plan to have someone take you home from the hospital or clinic.  If you will be  going home right after the procedure, plan to have someone with you for 24 hours. What happens during the procedure?  An IV tube will be inserted into one of your veins.  Medicine to help you relax (sedative) will be given through the IV tube.  The medical or dental procedure will be performed. What happens after the procedure?  Your blood pressure, heart rate, breathing rate, and blood oxygen level will be monitored often until the medicines you were given have worn off.  Do not drive for 24 hours. This information is not intended to replace advice given to you by your health care provider. Make sure you discuss any questions you have with your health care provider. Document Released:  05/26/2001 Document Revised: 02/04/2016 Document Reviewed: 12/21/2015 Elsevier Interactive Patient Education  2019 [employer].

## 2018-10-13 NOTE — Progress Notes (Signed)
Safety precautions to be maintained throughout the outpatient stay will include: orient to surroundings, keep bed in low position, maintain call bell within reach at all times, provide assistance with transfer out of bed and ambulation.  

## 2018-10-13 NOTE — Patient Instructions (Signed)
GENERAL RISKS AND COMPLICATIONS  What are the risk, side effects and possible complications? Generally speaking, most procedures are safe.  However, with any procedure there are risks, side effects, and the possibility of complications.  The risks and complications are dependent upon the sites that are lesioned, or the type of nerve block to be performed.  The closer the procedure is to the spine, the more serious the risks are.  Great care is taken when placing the radio frequency needles, block needles or lesioning probes, but sometimes complications can occur. 1. Infection: Any time there is an injection through the skin, there is a risk of infection.  This is why sterile conditions are used for these blocks.  There are four possible types of infection. 1. Localized skin infection. 2. Central Nervous System Infection-This can be in the form of Meningitis, which can be deadly. 3. Epidural Infections-This can be in the form of an epidural abscess, which can cause pressure inside of the spine, causing compression of the spinal cord with subsequent paralysis. This would require an emergency surgery to decompress, and there are no guarantees that the patient would recover from the paralysis. 4. Discitis-This is an infection of the intervertebral discs.  It occurs in about 1% of discography procedures.  It is difficult to treat and it may lead to surgery.        2. Pain: the needles have to go through skin and soft tissues, will cause soreness.       3. Damage to internal structures:  The nerves to be lesioned may be near blood vessels or    other nerves which can be potentially damaged.       4. Bleeding: Bleeding is more common if the patient is taking blood thinners such as  aspirin, Coumadin, Ticiid, Plavix, etc., or if he/she have some genetic predisposition  such as hemophilia. Bleeding into the spinal canal can cause compression of the spinal  cord with subsequent paralysis.  This would require an  emergency surgery to  decompress and there are no guarantees that the patient would recover from the  paralysis.       5. Pneumothorax:  Puncturing of a lung is a possibility, every time a needle is introduced in  the area of the chest or upper back.  Pneumothorax refers to free air around the  collapsed lung(s), inside of the thoracic cavity (chest cavity).  Another two possible  complications related to a similar event would include: Hemothorax and Chylothorax.   These are variations of the Pneumothorax, where instead of air around the collapsed  lung(s), you may have blood or chyle, respectively.       6. Spinal headaches: They may occur with any procedures in the area of the spine.       7. Persistent CSF (Cerebro-Spinal Fluid) leakage: This is a rare problem, but may occur  with prolonged intrathecal or epidural catheters either due to the formation of a fistulous  track or a dural tear.       8. Nerve damage: By working so close to the spinal cord, there is always a possibility of  nerve damage, which could be as serious as a permanent spinal cord injury with  paralysis.       9. Death:  Although rare, severe deadly allergic reactions known as "Anaphylactic  reaction" can occur to any of the medications used.      10. Worsening of the symptoms:  We can always make thing worse.    What are the chances of something like this happening? Chances of any of this occuring are extremely low.  By statistics, you have more of a chance of getting killed in a motor vehicle accident: while driving to the hospital than any of the above occurring .  Nevertheless, you should be aware that they are possibilities.  In general, it is similar to taking a shower.  Everybody knows that you can slip, hit your head and get killed.  Does that mean that you should not shower again?  Nevertheless always keep in mind that statistics do not mean anything if you happen to be on the wrong side of them.  Even if a procedure has a 1  (one) in a 1,000,000 (million) chance of going wrong, it you happen to be that one..Also, keep in mind that by statistics, you have more of a chance of having something go wrong when taking medications.  Who should not have this procedure? If you are on a blood thinning medication (e.g. Coumadin, Plavix, see list of "Blood Thinners"), or if you have an active infection going on, you should not have the procedure.  If you are taking any blood thinners, please inform your physician.  How should I prepare for this procedure?  Do not eat or drink anything at least six hours prior to the procedure.  Bring a driver with you .  It cannot be a taxi.  Come accompanied by an adult that can drive you back, and that is strong enough to help you if your legs get weak or numb from the local anesthetic.  Take all of your medicines the morning of the procedure with just enough water to swallow them.  If you have diabetes, make sure that you are scheduled to have your procedure done first thing in the morning, whenever possible.  If you have diabetes, take only half of your insulin dose and notify our nurse that you have done so as soon as you arrive at the clinic.  If you are diabetic, but only take blood sugar pills (oral hypoglycemic), then do not take them on the morning of your procedure.  You may take them after you have had the procedure.  Do not take aspirin or any aspirin-containing medications, at least eleven (11) days prior to the procedure.  They may prolong bleeding.  Wear loose fitting clothing that may be easy to take off and that you would not mind if it got stained with Betadine or blood.  Do not wear any jewelry or perfume  Remove any nail coloring.  It will interfere with some of our monitoring equipment.  NOTE: Remember that this is not meant to be interpreted as a complete list of all possible complications.  Unforeseen problems may occur.  BLOOD THINNERS The following drugs  contain aspirin or other products, which can cause increased bleeding during surgery and should not be taken for 2 weeks prior to and 1 week after surgery.  If you should need take something for relief of minor pain, you may take acetaminophen which is found in Tylenol,m Datril, Anacin-3 and Panadol. It is not blood thinner. The products listed below are.  Do not take any of the products listed below in addition to any listed on your instruction sheet.  A.P.C or A.P.C with Codeine Codeine Phosphate Capsules #3 Ibuprofen Ridaura  ABC compound Congesprin Imuran rimadil  Advil Cope Indocin Robaxisal  Alka-Seltzer Effervescent Pain Reliever and Antacid Coricidin or Coricidin-D  Indomethacin Rufen    Alka-Seltzer plus Cold Medicine Cosprin Ketoprofen S-A-C Tablets  Anacin Analgesic Tablets or Capsules Coumadin Korlgesic Salflex  Anacin Extra Strength Analgesic tablets or capsules CP-2 Tablets Lanoril Salicylate  Anaprox Cuprimine Capsules Levenox Salocol  Anexsia-D Dalteparin Magan Salsalate  Anodynos Darvon compound Magnesium Salicylate Sine-off  Ansaid Dasin Capsules Magsal Sodium Salicylate  Anturane Depen Capsules Marnal Soma  APF Arthritis pain formula Dewitt's Pills Measurin Stanback  Argesic Dia-Gesic Meclofenamic Sulfinpyrazone  Arthritis Bayer Timed Release Aspirin Diclofenac Meclomen Sulindac  Arthritis pain formula Anacin Dicumarol Medipren Supac  Analgesic (Safety coated) Arthralgen Diffunasal Mefanamic Suprofen  Arthritis Strength Bufferin Dihydrocodeine Mepro Compound Suprol  Arthropan liquid Dopirydamole Methcarbomol with Aspirin Synalgos  ASA tablets/Enseals Disalcid Micrainin Tagament  Ascriptin Doan's Midol Talwin  Ascriptin A/D Dolene Mobidin Tanderil  Ascriptin Extra Strength Dolobid Moblgesic Ticlid  Ascriptin with Codeine Doloprin or Doloprin with Codeine Momentum Tolectin  Asperbuf Duoprin Mono-gesic Trendar  Aspergum Duradyne Motrin or Motrin IB Triminicin  Aspirin  plain, buffered or enteric coated Durasal Myochrisine Trigesic  Aspirin Suppositories Easprin Nalfon Trillsate  Aspirin with Codeine Ecotrin Regular or Extra Strength Naprosyn Uracel  Atromid-S Efficin Naproxen Ursinus  Auranofin Capsules Elmiron Neocylate Vanquish  Axotal Emagrin Norgesic Verin  Azathioprine Empirin or Empirin with Codeine Normiflo Vitamin E  Azolid Emprazil Nuprin Voltaren  Bayer Aspirin plain, buffered or children's or timed BC Tablets or powders Encaprin Orgaran Warfarin Sodium  Buff-a-Comp Enoxaparin Orudis Zorpin  Buff-a-Comp with Codeine Equegesic Os-Cal-Gesic   Buffaprin Excedrin plain, buffered or Extra Strength Oxalid   Bufferin Arthritis Strength Feldene Oxphenbutazone   Bufferin plain or Extra Strength Feldene Capsules Oxycodone with Aspirin   Bufferin with Codeine Fenoprofen Fenoprofen Pabalate or Pabalate-SF   Buffets II Flogesic Panagesic   Buffinol plain or Extra Strength Florinal or Florinal with Codeine Panwarfarin   Buf-Tabs Flurbiprofen Penicillamine   Butalbital Compound Four-way cold tablets Penicillin   Butazolidin Fragmin Pepto-Bismol   Carbenicillin Geminisyn Percodan   Carna Arthritis Reliever Geopen Persantine   Carprofen Gold's salt Persistin   Chloramphenicol Goody's Phenylbutazone   Chloromycetin Haltrain Piroxlcam   Clmetidine heparin Plaquenil   Cllnoril Hyco-pap Ponstel   Clofibrate Hydroxy chloroquine Propoxyphen         Before stopping any of these medications, be sure to consult the physician who ordered them.  Some, such as Coumadin (Warfarin) are ordered to prevent or treat serious conditions such as "deep thrombosis", "pumonary embolisms", and other heart problems.  The amount of time that you may need off of the medication may also vary with the medication and the reason for which you were taking it.  If you are taking any of these medications, please make sure you notify your pain physician before you undergo any  procedures.         Moderate Conscious Sedation, Adult Sedation is the use of medicines to promote relaxation and relieve discomfort and anxiety. Moderate conscious sedation is a type of sedation. Under moderate conscious sedation, you are less alert than normal, but you are still able to respond to instructions, touch, or both. Moderate conscious sedation is used during short medical and dental procedures. It is milder than deep sedation, which is a type of sedation under which you cannot be easily woken up. It is also milder than general anesthesia, which is the use of medicines to make you unconscious. Moderate conscious sedation allows you to return to your regular activities sooner. Tell a health care provider about:  Any allergies you have.  All medicines you are  taking, including vitamins, herbs, eye drops, creams, and over-the-counter medicines.  Use of steroids (by mouth or creams).  Any problems you or family members have had with sedatives and anesthetic medicines.  Any blood disorders you have.  Any surgeries you have had.  Any medical conditions you have, such as sleep apnea.  Whether you are pregnant or may be pregnant.  Any use of cigarettes, alcohol, marijuana, or street drugs. What are the risks? Generally, this is a safe procedure. However, problems may occur, including:  Getting too much medicine (oversedation).  Nausea.  Allergic reaction to medicines.  Trouble breathing. If this happens, a breathing tube may be used to help with breathing. It will be removed when you are awake and breathing on your own.  Heart trouble.  Lung trouble. What happens before the procedure? Staying hydrated Follow instructions from your health care provider about hydration, which may include:  Up to 2 hours before the procedure - you may continue to drink clear liquids, such as water, clear fruit juice, black coffee, and plain tea. Eating and drinking  restrictions Follow instructions from your health care provider about eating and drinking, which may include:  8 hours before the procedure - stop eating heavy meals or foods such as meat, fried foods, or fatty foods.  6 hours before the procedure - stop eating light meals or foods, such as toast or cereal.  6 hours before the procedure - stop drinking milk or drinks that contain milk.  2 hours before the procedure - stop drinking clear liquids. Medicine Ask your health care provider about:  Changing or stopping your regular medicines. This is especially important if you are taking diabetes medicines or blood thinners.  Taking medicines such as aspirin and ibuprofen. These medicines can thin your blood. Do not take these medicines before your procedure if your health care provider instructs you not to.  Tests and exams  You will have a physical exam.  You may have blood tests done to show: ? How well your kidneys and liver are working. ? How well your blood can clot. General instructions  Plan to have someone take you home from the hospital or clinic.  If you will be going home right after the procedure, plan to have someone with you for 24 hours. What happens during the procedure?  An IV tube will be inserted into one of your veins.  Medicine to help you relax (sedative) will be given through the IV tube.  The medical or dental procedure will be performed. What happens after the procedure?  Your blood pressure, heart rate, breathing rate, and blood oxygen level will be monitored often until the medicines you were given have worn off.  Do not drive for 24 hours. This information is not intended to replace advice given to you by your health care provider. Make sure you discuss any questions you have with your health care provider. Document Released: 05/26/2001 Document Revised: 02/04/2016 Document Reviewed: 12/21/2015 [employer] Interactive Patient Education  2019 [employer].

## 2018-10-18 ENCOUNTER — Other Ambulatory Visit: Payer: Self-pay

## 2018-10-18 DIAGNOSIS — R7401 Elevation of levels of liver transaminase levels: Secondary | ICD-10-CM

## 2018-10-18 DIAGNOSIS — R740 Nonspecific elevation of levels of transaminase and lactic acid dehydrogenase [LDH]: Principal | ICD-10-CM

## 2018-10-18 LAB — HEPATIC FUNCTION PANEL
AG RATIO: 1.5 (calc) (ref 1.0–2.5)
ALT: 29 U/L (ref 6–29)
AST: 25 U/L (ref 10–35)
Albumin: 4.2 g/dL (ref 3.6–5.1)
Alkaline phosphatase (APISO): 83 U/L (ref 37–153)
Bilirubin, Direct: 0.1 mg/dL (ref 0.0–0.2)
GLOBULIN: 2.8 g/dL (ref 1.9–3.7)
Indirect Bilirubin: 0.3 mg/dL (calc) (ref 0.2–1.2)
Total Bilirubin: 0.4 mg/dL (ref 0.2–1.2)
Total Protein: 7 g/dL (ref 6.1–8.1)

## 2018-10-24 ENCOUNTER — Encounter: Payer: Self-pay | Admitting: Genetics

## 2018-10-24 ENCOUNTER — Other Ambulatory Visit: Payer: Self-pay

## 2018-10-26 ENCOUNTER — Encounter: Payer: Self-pay | Admitting: Student in an Organized Health Care Education/Training Program

## 2018-10-26 ENCOUNTER — Ambulatory Visit (HOSPITAL_BASED_OUTPATIENT_CLINIC_OR_DEPARTMENT_OTHER): Payer: Medicaid Other | Admitting: Student in an Organized Health Care Education/Training Program

## 2018-10-26 ENCOUNTER — Ambulatory Visit
Admission: RE | Admit: 2018-10-26 | Discharge: 2018-10-26 | Disposition: A | Discharge status: Home / Self Care | Payer: Medicaid Other | Source: Ambulatory Visit | Attending: Student in an Organized Health Care Education/Training Program | Admitting: Student in an Organized Health Care Education/Training Program

## 2018-10-26 ENCOUNTER — Other Ambulatory Visit: Payer: Self-pay

## 2018-10-26 VITALS — BP 143/96 | HR 75 | Temp 98.7°F | Resp 20 | Ht 61.5 in | Wt 169.0 lb

## 2018-10-26 DIAGNOSIS — G8929 Other chronic pain: Secondary | ICD-10-CM

## 2018-10-26 DIAGNOSIS — M25561 Pain in right knee: Secondary | ICD-10-CM | POA: Insufficient documentation

## 2018-10-26 DIAGNOSIS — G894 Chronic pain syndrome: Secondary | ICD-10-CM | POA: Diagnosis present

## 2018-10-26 DIAGNOSIS — M25562 Pain in left knee: Secondary | ICD-10-CM | POA: Insufficient documentation

## 2018-10-26 DIAGNOSIS — M170 Bilateral primary osteoarthritis of knee: Secondary | ICD-10-CM | POA: Insufficient documentation

## 2018-10-26 MED ORDER — LIDOCAINE HCL 2 % IJ SOLN
20.0000 mL | Freq: Once | INTRAMUSCULAR | Status: AC
  Administered 2018-10-26: 400 mg

## 2018-10-26 MED ORDER — LIDOCAINE HCL 2 % IJ SOLN
INTRAMUSCULAR | Status: AC
  Filled 2018-10-26: qty 20

## 2018-10-26 MED ORDER — LACTATED RINGERS IV SOLN
1000.0000 mL | Freq: Once | INTRAVENOUS | Status: AC
  Administered 2018-10-26: 1000 mL via INTRAVENOUS

## 2018-10-26 MED ORDER — ROPIVACAINE HCL 2 MG/ML IJ SOLN
10.0000 mL | Freq: Once | INTRAMUSCULAR | Status: AC
  Administered 2018-10-26: 10 mL

## 2018-10-26 MED ORDER — ROPIVACAINE HCL 2 MG/ML IJ SOLN
INTRAMUSCULAR | Status: AC
  Filled 2018-10-26: qty 10

## 2018-10-26 MED ORDER — FENTANYL CITRATE (PF) 100 MCG/2ML IJ SOLN
INTRAMUSCULAR | Status: AC
  Filled 2018-10-26: qty 2

## 2018-10-26 MED ORDER — DEXAMETHASONE SODIUM PHOSPHATE 10 MG/ML IJ SOLN
10.0000 mg | Freq: Once | INTRAMUSCULAR | Status: AC
  Administered 2018-10-26: 10 mg

## 2018-10-26 MED ORDER — DEXAMETHASONE SODIUM PHOSPHATE 10 MG/ML IJ SOLN
INTRAMUSCULAR | Status: AC
  Filled 2018-10-26: qty 2

## 2018-10-26 MED ORDER — FENTANYL CITRATE (PF) 100 MCG/2ML IJ SOLN
25.0000 ug | INTRAMUSCULAR | Status: DC | PRN
  Administered 2018-10-26: 75 ug via INTRAVENOUS

## 2018-10-26 NOTE — Patient Instructions (Signed)

## 2018-10-26 NOTE — Progress Notes (Signed)
Safety precautions to be maintained throughout the outpatient stay will include: orient to surroundings, keep bed in low position, maintain call bell within reach at all times, provide assistance with transfer out of bed and ambulation.  

## 2018-10-26 NOTE — Progress Notes (Signed)
Patient's Name: Kristina Reed  MRN: [id number]  Referring Provider: [name], MD  DOB: Jun 17, 1966  PCP: [name], MD  DOS: 10/26/2018  Note by: Gillis Santa, MD  Service setting: Ambulatory outpatient  Specialty: Interventional Pain Management  Patient type: Established  Location: [facility] (AMB) Pain Management Facility  Visit type: Interventional Procedure   Primary Reason for Visit: Interventional Pain Management Treatment. CC: Knee Pain (bilateral)  Procedure:          Anesthesia, Analgesia, Anxiolysis:  Type: Genicular Nerves Block (Superior-lateral, Superior-medial, and Inferior-medial Genicular Nerves)          CPT: 62694      Primary Purpose: Diagnostic Region: Lateral, Anterior, and Medial aspects of the knee joint, above and below the knee joint proper. Level: Superior and inferior to the knee joint. Target Area: For Genicular Nerve block(s), the targets are: the superior-lateral genicular nerve, located in the lateral distal portion of the femoral shaft as it curves to form the lateral epicondyle, in the region of the distal femoral metaphysis; the superior-medial genicular nerve, located in the medial distal portion of the femoral shaft as it curves to form the medial epicondyle; and the inferior-medial genicular nerve, located in the medial, proximal portion of the tibial shaft, as it curves to form the medial epicondyle, in the region of the proximal tibial metaphysis. Approach: Anterior, percutaneous, ipsilateral approach. Laterality: Bilateral  Type: Moderate (Conscious) Sedation combined with Local Anesthesia Indication(s): Analgesia and Anxiety Route: Intravenous (IV) IV Access: Secured Sedation: Meaningful verbal contact was maintained at all times during the procedure  Local Anesthetic: Lidocaine 1-2%  Position: Modified Fowler's position with pillows under the targeted knee(s).   Indications: 1. Bilateral primary osteoarthritis of knee   2. Chronic pain of both  knees   3. Chronic pain syndrome    Pain Score: Pre-procedure: 10-Worst pain ever/10 Post-procedure: 0-No pain/10  Pre-op Assessment:  Kristina Reed is a 53 y.o. (year old), female patient, seen today for interventional treatment. She  has a past surgical history that includes Abdominal hysterectomy; Colonoscopy with propofol (N/A, 06/27/2015); Tubal ligation; Knee arthroscopy (Right, 07/22/2016); Breast biopsy (Bilateral, 01/01/2016); Esophagogastroduodenoscopy (egd) with propofol (N/A, 07/22/2017); Colonoscopy with propofol (N/A, 09/23/2017); Knee arthroscopy with medial menisectomy (Left, 11/10/2017); and Esophagogastroduodenoscopy (egd) with propofol (N/A, 03/31/2018). Kristina Reed has a current medication list which includes the following prescription(s): aspirin-salicylamide-caffeine, escitalopram, fluoxetine, levothyroxine, meloxicam, omeprazole, and trazodone, and the following Facility-Administered Medications: fentanyl. Her primarily concern today is the Knee Pain (bilateral)  Initial Vital Signs:  Pulse/HCG Rate: 77ECG Heart Rate: 100 Temp: 98.3 F (36.8 C) Resp: 16 BP: (!) 148/99 SpO2: 100 %  BMI: Estimated body mass index is 31.42 kg/m as calculated from the following:   Height as of this encounter: 5' 1.5" (1.562 m).   Weight as of this encounter: 169 lb (76.7 kg).  Risk Assessment: Allergies: Reviewed. She is allergic to percocet [oxycodone-acetaminophen].  Allergy Precautions: None required Coagulopathies: Reviewed. None identified.  Blood-thinner therapy: None at this time Active Infection(s): Reviewed. None identified. Kristina Reed is afebrile  Site Confirmation: Kristina Reed was asked to confirm the procedure and laterality before marking the site Procedure checklist: Completed Consent: Before the procedure and under the influence of no sedative(s), amnesic(s), or anxiolytics, the patient was informed of the treatment options, risks and possible complications. To fulfill our ethical and  legal obligations, as recommended by the American Medical Association's Code of Ethics, I have informed the patient of my clinical impression; the nature  and purpose of the treatment or procedure; the risks, benefits, and possible complications of the intervention; the alternatives, including doing nothing; the risk(s) and benefit(s) of the alternative treatment(s) or procedure(s); and the risk(s) and benefit(s) of doing nothing. The patient was provided information about the general risks and possible complications associated with the procedure. These may include, but are not limited to: failure to achieve desired goals, infection, bleeding, organ or nerve damage, allergic reactions, paralysis, and death. In addition, the patient was informed of those risks and complications associated to the procedure, such as failure to decrease pain; infection; bleeding; organ or nerve damage with subsequent damage to sensory, motor, and/or autonomic systems, resulting in permanent pain, numbness, and/or weakness of one or several areas of the body; allergic reactions; (i.e.: anaphylactic reaction); and/or death. Furthermore, the patient was informed of those risks and complications associated with the medications. These include, but are not limited to: allergic reactions (i.e.: anaphylactic or anaphylactoid reaction(s)); adrenal axis suppression; blood sugar elevation that in diabetics may result in ketoacidosis or comma; water retention that in patients with history of congestive heart failure may result in shortness of breath, pulmonary edema, and decompensation with resultant heart failure; weight gain; swelling or edema; medication-induced neural toxicity; particulate matter embolism and blood vessel occlusion with resultant organ, and/or nervous system infarction; and/or aseptic necrosis of one or more joints. Finally, the patient was informed that Medicine is not an exact science; therefore, there is also the  possibility of unforeseen or unpredictable risks and/or possible complications that may result in a catastrophic outcome. The patient indicated having understood very clearly. We have given the patient no guarantees and we have made no promises. Enough time was given to the patient to ask questions, all of which were answered to the patient's satisfaction. Ms. Qin has indicated that she wanted to continue with the procedure. Attestation: I, the ordering provider, attest that I have discussed with the patient the benefits, risks, side-effects, alternatives, likelihood of achieving goals, and potential problems during recovery for the procedure that I have provided informed consent. Date  Time: 10/26/2018  9:26 AM  Pre-Procedure Preparation:  Monitoring: As per clinic protocol. Respiration, ETCO2, SpO2, BP, heart rate and rhythm monitor placed and checked for adequate function Safety Precautions: Patient was assessed for positional comfort and pressure points before starting the procedure. Time-out: I initiated and conducted the "Time-out" before starting the procedure, as per protocol. The patient was asked to participate by confirming the accuracy of the "Time Out" information. Verification of the correct person, site, and procedure were performed and confirmed by me, the nursing staff, and the patient. "Time-out" conducted as per Joint Commission's Universal Protocol (UP.01.01.01). Time: 1035  Description of Procedure:          Area Prepped: Entire knee area, from mid-thigh to mid-shin, lateral, anterior, and medial aspects. Prepping solution: ChloraPrep (2% chlorhexidine gluconate and 70% isopropyl alcohol) Safety Precautions: Aspiration looking for blood return was conducted prior to all injections. At no point did we inject any substances, as a needle was being advanced. No attempts were made at seeking any paresthesias. Safe injection practices and needle disposal techniques used. Medications  properly checked for expiration dates. SDV (single dose vial) medications used. Description of the Procedure: Protocol guidelines were followed. The patient was placed in position over the procedure table. The target area was identified and the area prepped in the usual manner. Skin & deeper tissues infiltrated with local anesthetic. Appropriate amount of time allowed to  pass for local anesthetics to take effect. The procedure needles were then advanced to the target area. Proper needle placement secured. Negative aspiration confirmed. Solution injected in intermittent fashion, asking for systemic symptoms every 0.5cc of injectate. The needles were then removed and the area cleansed, making sure to leave some of the prepping solution back to take advantage of its long term bactericidal properties.  Vitals:   10/26/18 1055 10/26/18 1105 10/26/18 1115 10/26/18 1125  BP: (!) 142/89 (!) 142/75 (!) 147/87 (!) 143/96  Pulse:  68 82 75  Resp: 18 13 15 20   Temp:  98.3 F (36.8 C)  98.7 F (37.1 C)  TempSrc:  Temporal  Temporal  SpO2: 97% 99% 99% 97%  Weight:      Height:        Start Time: 1035 hrs. End Time: 1055 hrs. Materials:  Needle(s) Type: Spinal Needle Gauge: 25G Length: 3.5-in Medication(s): Please see orders for medications and dosing details. 5 cc solution made of 4 cc of 0.2% ropivacaine, 1 cc of Decadron 10 mg/cc.  1.5 cc injected at each level on the left. 5 cc solution made of 4 cc of 0.2% ropivacaine, 1 cc of Decadron 10 mg/cc.  1.5 cc injected at each level on the right. Imaging Guidance (Non-Spinal):          Type of Imaging Technique: Fluoroscopy Guidance (Non-Spinal) Indication(s): Assistance in needle guidance and placement for procedures requiring needle placement in or near specific anatomical locations not easily accessible without such assistance. Exposure Time: Please see nurses notes. Contrast: None used. Fluoroscopic Guidance: I was personally present during the use  of fluoroscopy. "Tunnel Vision Technique" used to obtain the best possible view of the target area. Parallax error corrected before commencing the procedure. "Direction-depth-direction" technique used to introduce the needle under continuous pulsed fluoroscopy. Once target was reached, antero-posterior, oblique, and lateral fluoroscopic projection used confirm needle placement in all planes. Images permanently stored in EMR. Interpretation: No contrast injected. I personally interpreted the imaging intraoperatively. Adequate needle placement confirmed in multiple planes. Permanent images saved into the patient's record.  Antibiotic Prophylaxis:   Anti-infectives (From admission, onward)   None     Indication(s): None identified  Post-operative Assessment:  Post-procedure Vital Signs:  Pulse/HCG Rate: 7576 Temp: 98.7 F (37.1 C) Resp: 20 BP: (!) 143/96 SpO2: 97 %  EBL: None  Complications: No immediate post-treatment complications observed by team, or reported by patient.  Note: The patient tolerated the entire procedure well. A repeat set of vitals were taken after the procedure and the patient was kept under observation following institutional policy, for this type of procedure. Post-procedural neurological assessment was performed, showing return to baseline, prior to discharge. The patient was provided with post-procedure discharge instructions, including a section on how to identify potential problems. Should any problems arise concerning this procedure, the patient was given instructions to immediately contact us, at any time, without hesitation. In any case, we plan to contact the patient by telephone for a follow-up status report regarding this interventional procedure.  Comments:  No additional relevant information.  Plan of Care    Imaging Orders     DG C-Arm 1-60 Min-No Report Procedure Orders    No procedure(s) ordered today    Medications ordered for procedure: Meds  ordered this encounter  Medications  . lactated ringers infusion 1,000 mL  . fentaNYL (SUBLIMAZE) injection 25-100 mcg    Make sure Narcan is available in the pyxis when using this medication. In  the event of respiratory depression (RR< 8/min): Titrate NARCAN (naloxone) in increments of 0.1 to 0.2 mg IV at 2-3 minute intervals, until desired degree of reversal.  . ropivacaine (PF) 2 mg/mL (0.2%) (NAROPIN) injection 10 mL  . lidocaine (XYLOCAINE) 2 % (with pres) injection 400 mg  . dexamethasone (DECADRON) injection 10 mg  . dexamethasone (DECADRON) injection 10 mg   Medications administered: We administered lactated ringers, fentaNYL, ropivacaine (PF) 2 mg/mL (0.2%), lidocaine, dexamethasone, and dexamethasone.  See the medical record for exact dosing, route, and time of administration.  Disposition: Discharge home  Discharge Date & Time: 10/26/2018; 1130 hrs.   Physician-requested Follow-up: Return in about 4 weeks (around 11/23/2018) for Post Procedure Evaluation.  Future Appointments  Date Time Provider [place]  11/07/2018  1:00 PM Clarene Essex, Counselor [facility] None  11/07/2018  2:15 PM [facility] LAB 3 [facility] None  11/14/2018 11:20 AM Rockey Situ, Kathlene November, MD [facility]  11/24/2018  9:30 AM Gillis Santa, MD [facility] None  09/11/2019  9:00 AM [name], MD [facility]  09/25/2019  9:45 AM Diamantina Providence, Herbert Seta, MD [facility] None   Primary Care Physician: [name], MD Location: [facility] Note by: Gillis Santa, MD Date: 10/26/2018; Time: 11:48 AM  Disclaimer:  Medicine is not an exact science. The only guarantee in medicine is that nothing is guaranteed. It is important to note that the decision to proceed with this intervention was based on the information collected from the patient. The Data and conclusions were drawn from the patient's questionnaire, the interview, and the physical examination. Because the  information was provided in large part by the patient, it cannot be guaranteed that it has not been purposely or unconsciously manipulated. Every effort has been made to obtain as much relevant data as possible for this evaluation. It is important to note that the conclusions that lead to this procedure are derived in large part from the available data. Always take into account that the treatment will also be dependent on availability of resources and existing treatment guidelines, considered by other Pain Management Practitioners as being common knowledge and practice, at the time of the intervention. For Medico-Legal purposes, it is also important to point out that variation in procedural techniques and pharmacological choices are the acceptable norm. The indications, contraindications, technique, and results of the above procedure should only be interpreted and judged by a Board-Certified Interventional Pain Specialist with extensive familiarity and expertise in the same exact procedure and technique.

## 2018-10-27 ENCOUNTER — Telehealth: Payer: Self-pay | Admitting: Student in an Organized Health Care Education/Training Program

## 2018-10-27 NOTE — Telephone Encounter (Signed)
See previous note

## 2018-10-27 NOTE — Telephone Encounter (Signed)
Called patient to inform her that nothing would be call in for her that she needs to wait 3-10 days for the steroid to start working to help her pain, Pt is taking Meloxican. Instructed to read her dc instructions on what to do after procedure.

## 2018-10-27 NOTE — Telephone Encounter (Signed)
Patient having a lot of pain since procedures yesterday, wants to know if she can get some pain meds

## 2018-10-27 NOTE — Telephone Encounter (Signed)
Pt left voicemail stating that she is in a lot of pain and wanted to know if any pain mediation was sent to the pharmacy for her.

## 2018-11-07 ENCOUNTER — Other Ambulatory Visit: Payer: Self-pay

## 2018-11-07 ENCOUNTER — Encounter: Payer: Self-pay | Admitting: Genetic Counselor

## 2018-11-07 ENCOUNTER — Telehealth: Payer: Self-pay | Admitting: Genetics

## 2018-11-07 NOTE — Telephone Encounter (Signed)
Patient called to reschedule  °

## 2018-11-10 NOTE — Progress Notes (Signed)
Cardiology Office Note  Date:  11/14/2018   ID:  [name], DOB: 04-13-1966, MRN: [id number]  PCP:  [name], MD   Chief Complaint  Patient presents with  . other    chest pain with abnormal EKG c/o heartburn. Meds reviewed verbally with patient.    HPI:  Kristina Reed is a 53 y.o. female with a history of: Essential hypertension Esophageal dysphagia Obesity (BMI 30.0-34.9) Eosinophilic esophagitis Hematuria Chronic nausea Depression, major, recurrent, moderate (HCC) Adult hypothyroidism Referred by Dr. Sanda Klein for consultation of her chest pain, abnormal Ekg   INTERVAL HISTORY: The patient reports today for an initial visit. She is concerned about her heart. Never smoker.  Denies diabetes  previous EKG from primary care 09/05/2018 shows a few PVC's, nonspecific T wave abnormality Endorses being able to walk around to do errands and chores without issue. Denies shortness of breath or chest pain on exertion  can feel that her heart rate goes up whenever she exerts herself too much.  Has had chest pain episodes, likely presenting at rest. It is a sharp pain that usually lasts 10 minutes.She has had difficulty swallowing before, but this was not similar to the chest pain.   Still taking levothyroxine 75 mcg and meloxicam.  Her CT scan from 03/24/2016 shows no plaque build-up. She has never had an echocardiogram.   She stays away from sugars and excessive carbohydrates.   CT ABD/pelvis: 03/2017  Today's Blood pressure 114/90 Total Chol 186/ LDL 114 CR 0.72 Glucose 72  EKG personally reviewed by myself on todays visit Shows normal sinus rhythm. 76 bpm. Nonspecific T wave Abn III, Avf, V1-V3. Borderline ECG   OTHER PAST MEDICAL HISTORY REVIEWED BY ME FOR TODAY'S VISIT:   PMH:   has a past medical history of Anxiety, Arthritis, Cancer ([place]), Cognitive impairment, mild, so stated, Depression, Diverticulosis, Eosinophilic esophagitis (25/85/2778), Gallstones, GERD  (gastroesophageal reflux disease), cervical cancer (01/30/2016), Hypertension, Hypothyroidism, Sleep apnea, Status post partial hysterectomy, and Vaginal inclusion cyst.  PSH:    Past Surgical History:  Procedure Laterality Date  . ABDOMINAL HYSTERECTOMY     partial hysterectomy  . BREAST BIOPSY Bilateral 01/01/2016   [name] cores. Fibroadenomas  . COLONOSCOPY WITH PROPOFOL N/A 06/27/2015   Procedure: COLONOSCOPY WITH PROPOFOL;  Surgeon: Josefine Class, MD;  Location: [facility] ENDOSCOPY;  Service: Endoscopy;  Laterality: N/A;  . COLONOSCOPY WITH PROPOFOL N/A 09/23/2017   Procedure: COLONOSCOPY WITH PROPOFOL;  Surgeon: Jonathon Bellows, MD;  Location[facility] ENDOSCOPY;  Service: Gastroenterology;  Laterality: N/A;  . ESOPHAGOGASTRODUODENOSCOPY (EGD) WITH PROPOFOL N/A 07/22/2017   Procedure: ESOPHAGOGASTRODUODENOSCOPY (EGD) WITH PROPOFOL;  Surgeon: Jonathon Bellows, MD;  Location: [facility] ENDOSCOPY;  Service: Gastroenterology;  Laterality: N/A;  . ESOPHAGOGASTRODUODENOSCOPY (EGD) WITH PROPOFOL N/A 03/31/2018   Procedure: ESOPHAGOGASTRODUODENOSCOPY (EGD) WITH PROPOFOL;  Surgeon: Lin Landsman, MD;  Location: [facility] ENDOSCOPY;  Service: Gastroenterology;  Laterality: N/A;  . KNEE ARTHROSCOPY Right 07/22/2016   Procedure: ARTHROSCOPY KNEE debridement microfracture;  Surgeon: Leanor Kail, MD;  Location: [facility] ORS;  Service: Orthopedics;  Laterality: Right;  . KNEE ARTHROSCOPY WITH MEDIAL MENISECTOMY Left 11/10/2017   Procedure: KNEE ARTHROSCOPY WITH PARTIAL MEDIAL MENISECTOMY&patella femoral debridement, & ablation;  Surgeon: Leanor Kail, MD;  Location: [facility] ORS;  Service: Orthopedics;  Laterality: Left;  . TUBAL LIGATION      Current Outpatient Medications  Medication Sig Dispense Refill  . calcium carbonate (TUMS - DOSED IN MG ELEMENTAL CALCIUM) 500 MG chewable tablet Chew 1 tablet by mouth as needed for indigestion  or heartburn.    . levothyroxine (SYNTHROID, LEVOTHROID) 75 MCG tablet Take 1 tablet (75  mcg total) by mouth daily. 90 tablet 1  . meloxicam (MOBIC) 7.5 MG tablet Take 7.5 mg by mouth daily.    . Aspirin-Salicylamide-Caffeine (BC HEADACHE POWDER PO) Take by mouth.     No current facility-administered medications for this visit.      ALLERGIES:   Percocet [oxycodone-acetaminophen]   SOCIAL HISTORY:  The patient  reports that she has never smoked. She has never used smokeless tobacco. She reports that she does not drink alcohol or use drugs.   FAMILY HISTORY:   family history includes Breast cancer in her maternal aunt; Cancer in her father, maternal grandmother, paternal grandfather, and paternal grandmother; Cirrhosis in her cousin; Diabetes in her mother.    REVIEW OF SYSTEMS: Review of Systems  Constitutional: Negative.   Eyes: Negative.   Respiratory: Negative.   Cardiovascular: Positive for chest pain.  Gastrointestinal: Negative.   Genitourinary: Negative.   Musculoskeletal: Negative.   Neurological: Negative.   Psychiatric/Behavioral: Negative.   All other systems reviewed and are negative.    PHYSICAL EXAM: VS:  BP 114/90 (BP Location: Right Arm, Patient Position: Sitting, Cuff Size: Normal)   Pulse 76   Ht 5' 1.5" (1.562 m)   Wt 168 lb (76.2 kg)   LMP 05/09/1993 (Approximate)   SpO2 99%   BMI 31.23 kg/m  , BMI Body mass index is 31.23 kg/m.  GEN: Well nourished, well developed, in no acute distress HEENT: normal Neck: no JVD, carotid bruits, or masses Cardiac: RRR; no murmurs, rubs, or gallops,no edema Respiratory:  clear to auscultation bilaterally, normal work of breathing GI: soft, nontender, nondistended, + BS MS: no deformity or atrophy Skin: warm and dry, no rash Neuro:  Strength and sensation are intact Psych: euthymic mood, full affect    RECENT LABS: 09/05/2018: BUN 10; Creat 0.72; Hemoglobin 12.2; Platelets 231; Potassium 3.9; Sodium 137; TSH 4.16 10/18/2018: ALT 29    LIPID PANEL: Lab Results  Component Value Date   CHOL 186  09/05/2018   HDL 46 (L) 09/05/2018   LDLCALC 114 (H) 09/05/2018   TRIG 144 09/05/2018      WEIGHT: Wt Readings from Last 3 Encounters:  11/14/18 168 lb (76.2 kg)  10/26/18 169 lb (76.7 kg)  10/13/18 169 lb 6.4 oz (76.8 kg)       ASSESSMENT AND PLAN:  Chest pain, unspecified type -  Some atypical features, presenting at rest, not with exertion   very active at baseline without symptoms Abnormal EKG, nonspecific T wave abnormality noted Likely a benign finding.  Echocardiogram has been ordered to evaluate cardiac function  Abnormal EKG - Plan: ECHOCARDIOGRAM COMPLETE As above, nonspecific finding Possibly exacerbated by lead placement Echocardiogram pending Non-smoker, no diabetes, cholesterol reasonable  Disposition:   F/U  PRN  Patient was seen in consultation for Dr. Sanda Klein and will be referred back to her office for ongoing care of the issues detailed above  Total encounter time more than 60 minutes. Greater than 50% was spent in counseling and coordination of care with the patient.    Orders Placed This Encounter  Procedures  . EKG 12-Lead  . ECHOCARDIOGRAM COMPLETE    I, Jesus Reyes am acting as a scribe for Ida Rogue, M.D., Ph.D.  I, Ida Rogue, M.D. Ph.D., have reviewed the above documentation for accuracy and completeness, and I agree with the above.   Mila Merry, M.D., Ph.D. 11/14/2018  Larence Penning  [place], [place]

## 2018-11-14 ENCOUNTER — Ambulatory Visit: Payer: Medicaid Other | Admitting: Cardiovascular Disease

## 2018-11-14 ENCOUNTER — Encounter: Payer: Self-pay | Admitting: Cardiovascular Disease

## 2018-11-14 VITALS — BP 114/90 | HR 76 | Ht 61.5 in | Wt 168.0 lb

## 2018-11-14 DIAGNOSIS — R9431 Abnormal electrocardiogram [ECG] [EKG]: Secondary | ICD-10-CM | POA: Diagnosis not present

## 2018-11-14 DIAGNOSIS — R079 Chest pain, unspecified: Secondary | ICD-10-CM | POA: Diagnosis not present

## 2018-11-14 NOTE — Patient Instructions (Addendum)
Medication Instructions:  No changes  If you need a refill on your cardiac medications before your next appointment, please call your pharmacy.    Lab work: No new labs needed   If you have labs (blood work) drawn today and your tests are completely normal, you will receive your results only by: Marland Kitchen MyChart Message (if you have MyChart) OR . A paper copy in the mail If you have any lab test that is abnormal or we need to change your treatment, we will call you to review the results.   Testing/Procedures: We will order an echocardiogram for chest pain, abnormal EKG  Your physician has requested that you have an echocardiogram. Echocardiography is a painless test that uses sound waves to create images of your heart. It provides your doctor with information about the size and shape of your heart and how well your heart's chambers and valves are working. This procedure takes approximately one hour. There are no restrictions for this procedure.   Follow-Up: At [facility], you and your health needs are our priority.  As part of our continuing mission to provide you with exceptional heart care, we have created designated Provider Care Teams.  These Care Teams include your primary Cardiologist (physician) and Advanced Practice Providers (APPs -  Physician Assistants and Nurse Practitioners) who all work together to provide you with the care you need, when you need it.  . You will need a follow up appointment as needed  . Providers on your designated Care Team:   . Murray Hodgkins, NP . Christell Faith, PA-C . Marrianne Mood, PA-C  Any Other Special Instructions Will Be Listed Below (If Applicable).  For educational health videos Log in to : [email or web address].com Or : [email or web address], password : [email or web address]   Echocardiogram An echocardiogram is a procedure that uses painless sound waves (ultrasound) to produce an image of the heart. Images from an echocardiogram can provide important information  about:  Signs of coronary artery disease (CAD).  Aneurysm detection. An aneurysm is a weak or damaged part of an artery wall that bulges out from the normal force of blood pumping through the body.  Heart size and shape. Changes in the size or shape of the heart can be associated with certain conditions, including heart failure, aneurysm, and CAD.  Heart muscle function.  Heart valve function.  Signs of a past heart attack.  Fluid buildup around the heart.  Thickening of the heart muscle.  A tumor or infectious growth around the heart valves. Tell a health care provider about:  Any allergies you have.  All medicines you are taking, including vitamins, herbs, eye drops, creams, and over-the-counter medicines.  Any blood disorders you have.  Any surgeries you have had.  Any medical conditions you have.  Whether you are pregnant or may be pregnant. What are the risks? Generally, this is a safe procedure. However, problems may occur, including:  Allergic reaction to dye (contrast) that may be used during the procedure. What happens before the procedure? No specific preparation is needed. You may eat and drink normally. What happens during the procedure?   An IV tube may be inserted into one of your veins.  You may receive contrast through this tube. A contrast is an injection that improves the quality of the pictures from your heart.  A gel will be applied to your chest.  A wand-like tool (transducer) will be moved over your chest. The gel will help to transmit the sound waves from the transducer.  The sound waves will harmlessly bounce off of your heart to allow the heart images to be captured in real-time motion. The images will be recorded on a computer. The procedure may vary among health care providers and hospitals. What happens after the procedure?  You may return to your normal, everyday life, including diet, activities, and medicines, unless your health care  provider tells you not to do that. Summary  An echocardiogram is a procedure that uses painless sound waves (ultrasound) to produce an image of the heart.  Images from an echocardiogram can provide important information about the size and shape of your heart, heart muscle function, heart valve function, and fluid buildup around your heart.  You do not need to do anything to prepare before this procedure. You may eat and drink normally.  After the echocardiogram is completed, you may return to your normal, everyday life, unless your health care provider tells you not to do that. This information is not intended to replace advice given to you by your health care provider. Make sure you discuss any questions you have with your health care provider. Document Released: 08/28/2000 Document Revised: 10/03/2016 Document Reviewed: 10/03/2016 Elsevier Interactive Patient Education  2019 [employer].

## 2018-11-15 ENCOUNTER — Ambulatory Visit: Payer: Medicaid Other | Admitting: Family Medicine

## 2018-11-17 ENCOUNTER — Other Ambulatory Visit: Payer: Self-pay

## 2018-11-20 ENCOUNTER — Other Ambulatory Visit: Payer: Self-pay

## 2018-11-20 ENCOUNTER — Emergency Department
Admission: EM | Admit: 2018-11-20 | Discharge: 2018-11-20 | Disposition: A | Discharge status: Home / Self Care | Payer: Medicaid Other | Attending: Emergency Medicine | Admitting: Emergency Medicine

## 2018-11-20 ENCOUNTER — Emergency Department: Payer: Medicaid Other

## 2018-11-20 DIAGNOSIS — J069 Acute upper respiratory infection, unspecified: Secondary | ICD-10-CM | POA: Insufficient documentation

## 2018-11-20 DIAGNOSIS — I10 Essential (primary) hypertension: Secondary | ICD-10-CM | POA: Diagnosis not present

## 2018-11-20 DIAGNOSIS — B9789 Other viral agents as the cause of diseases classified elsewhere: Secondary | ICD-10-CM

## 2018-11-20 DIAGNOSIS — R05 Cough: Secondary | ICD-10-CM | POA: Diagnosis present

## 2018-11-20 DIAGNOSIS — E039 Hypothyroidism, unspecified: Secondary | ICD-10-CM | POA: Insufficient documentation

## 2018-11-20 DIAGNOSIS — Z79899 Other long term (current) drug therapy: Secondary | ICD-10-CM | POA: Insufficient documentation

## 2018-11-20 MED ORDER — BENZONATATE 100 MG PO CAPS: 100.0000 mg | ORAL_CAPSULE | Freq: Three times a day (TID) | ORAL | 0 refills | Status: AC | PRN

## 2018-11-20 NOTE — ED Notes (Signed)
Pt refused a last set of vital signs stating shew as "ready to go".

## 2018-11-20 NOTE — ED Provider Notes (Signed)
[facility] Emergency Department Provider Note  ____________________________________________  Time seen: Approximately 4:12 PM  I have reviewed the triage vital signs and the nursing notes.   HISTORY  Chief Complaint Cough    HPI Kristina Reed is a 53 y.o. female presents to the emergency department with rhinorrhea, nasal congestion and nonproductive cough for the past 24 hours.  Patient does report that she has shortness of breath with cough.  No fatigue or fever.  She denies chest pain, chest tightness or abdominal pain.  No recent travel out of the country.  No sick contacts in the home with similar symptoms.  Patient is accompanied by a family member.        Past Medical History:  Diagnosis Date  . Anxiety   . Arthritis    right knee and right elbow  . Cancer ([place])    Cervical CA with partial hysterectomy.  . Cognitive impairment, mild, so stated   . Depression   . Diverticulosis   . Eosinophilic esophagitis 81/85/6314   Biopsy Dec 2018  . Gallstones   . GERD (gastroesophageal reflux disease)   . Hx of cervical cancer 01/30/2016  . Hypertension   . Hypothyroidism   . Sleep apnea    does not use a C-PAP  . Status post partial hysterectomy    Due to Cervical CA  . Vaginal inclusion cyst     Patient Active Problem List   Diagnosis Date Noted  . Esophageal dysphagia   . Essential hypertension 01/19/2018  . Obesity (BMI 30.0-34.9) 01/19/2018  . Preventative health care 09/03/2017  . Eosinophilic esophagitis 97/10/6376  . Apophysitis 08/26/2017  . 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 Pain in joint, multiple sites 07/05/2017  . Osteoarthritis of knee 07/05/2017  . Lichen sclerosus et atrophicus 04/27/2017  . Headache 04/27/2017  . Headache disorder 02/17/2017  . Post-concussion headache 02/17/2017  . Medication monitoring encounter 09/21/2016  . Hematuria 04/23/2016  . Hx of cervical cancer 01/30/2016  . Menopause 01/30/2016  .  Status post bilateral breast biopsy 01/07/2016  . Chronic nausea 10/25/2015  . Screening for STD (sexually transmitted disease) 10/25/2015  . Rectal bleeding 04/30/2015  . Right knee pain 07/23/2014  . Depression, major, recurrent, moderate ([place]) 02/02/2014  . Adult hypothyroidism 02/02/2014  . Obstructive apnea 02/02/2014  . Anxiety and depression 02/02/2014  . Incomplete bladder emptying 12/12/2012  . Tenosynovitis of foot 05/30/2012  . Benign neoplasm of kidney 05/13/2012  . Urge incontinence 05/13/2012  . FOM (frequency of micturition) 05/13/2012  . Chronic pain of right hand 05/05/2012  . Tarsal tunnel syndrome 03/03/2012    Past Surgical History:  Procedure Laterality Date  . ABDOMINAL HYSTERECTOMY     partial hysterectomy  . BREAST BIOPSY Bilateral 01/01/2016   [name] cores. Fibroadenomas  . COLONOSCOPY WITH PROPOFOL N/A 06/27/2015   Procedure: COLONOSCOPY WITH PROPOFOL;  Surgeon: Josefine Class, MD;  Location[facility] ENDOSCOPY;  Service: Endoscopy;  Laterality: N/A;  . COLONOSCOPY WITH PROPOFOL N/A 09/23/2017   Procedure: COLONOSCOPY WITH PROPOFOL;  Surgeon: Jonathon Bellows, MD;  Location: [facility] ENDOSCOPY;  Service: Gastroenterology;  Laterality: N/A;  . ESOPHAGOGASTRODUODENOSCOPY (EGD) WITH PROPOFOL N/A 07/22/2017   Procedure: ESOPHAGOGASTRODUODENOSCOPY (EGD) WITH PROPOFOL;  Surgeon: Jonathon Bellows, MD;  Location: [facility] ENDOSCOPY;  Service: Gastroenterology;  Laterality: N/A;  . ESOPHAGOGASTRODUODENOSCOPY (EGD) WITH PROPOFOL N/A 03/31/2018   Procedure: ESOPHAGOGASTRODUODENOSCOPY (EGD) WITH PROPOFOL;  Surgeon: Lin Landsman, MD;  Location: [facility] ENDOSCOPY;  Service: Gastroenterology;  Laterality: N/A;  . KNEE ARTHROSCOPY  Right 07/22/2016   Procedure: ARTHROSCOPY KNEE debridement microfracture;  Surgeon: Leanor Kail, MD;  Location: [facility] ORS;  Service: Orthopedics;  Laterality: Right;  . KNEE ARTHROSCOPY WITH MEDIAL MENISECTOMY Left 11/10/2017   Procedure: KNEE ARTHROSCOPY WITH  PARTIAL MEDIAL MENISECTOMY&patella femoral debridement, & ablation;  Surgeon: Leanor Kail, MD;  Location: [facility] ORS;  Service: Orthopedics;  Laterality: Left;  . TUBAL LIGATION      Prior to Admission medications   Medication Sig Start Date End Date Taking? Authorizing Provider  Aspirin-Salicylamide-Caffeine (BC HEADACHE POWDER PO) Take by mouth.    [provider]  benzonatate (TESSALON PERLES) 100 MG capsule Take 1 capsule (100 mg total) by mouth 3 (three) times daily as needed for up to 7 days for cough. 11/20/18 11/27/18  Lannie Fields, PA-C  calcium carbonate (TUMS - DOSED IN MG ELEMENTAL CALCIUM) 500 MG chewable tablet Chew 1 tablet by mouth as needed for indigestion or heartburn.    [provider]  levothyroxine (SYNTHROID, LEVOTHROID) 75 MCG tablet Take 1 tablet (75 mcg total) by mouth daily. 09/05/18   [name], MD  meloxicam (MOBIC) 7.5 MG tablet Take 7.5 mg by mouth daily.    [provider]    Allergies Percocet [oxycodone-acetaminophen]  Family History  Problem Relation Age of Onset  . Cancer Father        unknown  . Diabetes Mother   . Breast cancer Maternal Aunt   . Cancer Maternal Grandmother        cancer?  . Cancer Paternal Grandmother        cancer?  . Cancer Paternal Grandfather        unknown  . Cirrhosis Cousin     Social History Social History   Tobacco Use  . Smoking status: Never Smoker  . Smokeless tobacco: Never Used  Substance Use Topics  . Alcohol use: No  . Drug use: No      Review of Systems  Constitutional: Patient has fever.  Eyes: No visual changes. No discharge ENT: Patient has congestion.  Cardiovascular: no chest pain. Respiratory: Patient has cough.  Gastrointestinal: No abdominal pain.  No nausea, no vomiting. Patient had diarrhea.  Genitourinary: Negative for dysuria. No hematuria Musculoskeletal: Patient has myalgias.  Skin: Negative for rash, abrasions, lacerations,  ecchymosis. Neurological: Patient has headache, no focal weakness or numbness.      ____________________________________________   PHYSICAL EXAM:  VITAL SIGNS: ED Triage Vitals  Enc Vitals Group     BP --      Pulse --      Resp --      Temp --      Temp src --      SpO2 --      Weight 11/20/18 1451 168 lb (76.2 kg)     Height 11/20/18 1451 5\' 1"  (1.549 m)     Head Circumference --      Peak Flow --      Pain Score 11/20/18 1450 0     Pain Loc --      Pain Edu? --      Excl. in [place]? --      Constitutional: Alert and oriented. Patient is lying supine. Eyes: Conjunctivae are normal. PERRL. EOMI. Head: Atraumatic. ENT:      Ears: Tympanic membranes are mildly injected with mild effusion bilaterally.       Nose: No congestion/rhinnorhea.      Mouth/Throat: Mucous membranes are moist. Posterior pharynx is mildly erythematous.  Hematological/Lymphatic/Immunilogical:  No cervical lymphadenopathy.  Cardiovascular: Normal rate, regular rhythm. Normal S1 and S2.  Good peripheral circulation. Respiratory: Normal respiratory effort without tachypnea or retractions. Lungs CTAB. Good air entry to the bases with no decreased or absent breath sounds. Gastrointestinal: Bowel sounds 4 quadrants. Soft and nontender to palpation. No guarding or rigidity. No palpable masses. No distention. No CVA tenderness. Musculoskeletal: Full range of motion to all extremities. No gross deformities appreciated. Neurologic:  Normal speech and language. No gross focal neurologic deficits are appreciated.  Skin:  Skin is warm, dry and intact. No rash noted. Psychiatric: Mood and affect are normal. Speech and behavior are normal. Patient exhibits appropriate insight and judgement.    ____________________________________________   LABS (all labs ordered are listed, but only abnormal results are displayed)  Labs Reviewed - No data to  display ____________________________________________  EKG   ____________________________________________  RADIOLOGY I personally viewed and evaluated these images as part of my medical decision making, as well as reviewing the written report by the radiologist.    Dg Chest 2 View  Result Date: 11/20/2018 CLINICAL DATA:  Cough EXAM: CHEST - 2 VIEW COMPARISON:  05/23/2018 chest radiograph. FINDINGS: Stable cardiomediastinal silhouette with normal heart size. No pneumothorax. No pleural effusion. Lungs appear clear, with no acute consolidative airspace disease and no pulmonary edema. Surgical clips are seen in the right upper quadrant of the abdomen. IMPRESSION: No active cardiopulmonary disease. Electronically Signed   By: Ilona Sorrel M.D.   On: 11/20/2018 17:33    ____________________________________________    PROCEDURES  Procedure(s) performed:    Procedures    Medications - No data to display   ____________________________________________   INITIAL IMPRESSION / ASSESSMENT AND PLAN / ED COURSE  Pertinent labs & imaging results that were available during my care of the patient were reviewed by me and considered in my medical decision making (see chart for details).  Review of the [place] CSRS was performed in accordance of the [place] prior to dispensing any controlled drugs.         Assessment and plan:  Viral URI with cough Patient presents to the emergency department with cold-like symptoms for 1 day.  Chest x-ray reveals no consolidations, opacities or infiltrates suggest community-acquired pneumonia.  Patient was discharged with [facility].  She was advised to follow-up with primary care as needed.  All patient questions were answered.   ____________________________________________  FINAL CLINICAL IMPRESSION(S) / ED DIAGNOSES  Final diagnoses:  Viral URI with cough      NEW MEDICATIONS STARTED DURING THIS VISIT:  ED Discharge Orders         Ordered     benzonatate (TESSALON PERLES) 100 MG capsule  3 times daily PRN     11/20/18 1750              This chart was dictated using voice recognition software/Dragon. Despite best efforts to proofread, errors can occur which can change the meaning. Any change was purely unintentional.    Lannie Fields, PA-C 11/20/18 1813    Carrie Mew, MD 11/22/18 [phone]

## 2018-11-20 NOTE — ED Triage Notes (Signed)
Pt states that she started coughing and sneezing last night, states a couple times she coughed so hard she vomited, no fever, no pain, no obvious signs of sickness or distress

## 2018-11-23 ENCOUNTER — Ambulatory Visit: Payer: Medicaid Other | Admitting: Nurse Practitioner

## 2018-11-24 ENCOUNTER — Ambulatory Visit: Payer: Self-pay | Admitting: Student in an Organized Health Care Education/Training Program

## 2018-11-28 ENCOUNTER — Telehealth: Payer: Self-pay | Admitting: Family Medicine

## 2018-11-28 NOTE — Telephone Encounter (Signed)
Please see the note from Ferol Luz in oncology dated 10/11/2018 and go from there; contact [facility] if needed, see if they did testing, etc. I don't see anything else in the chart

## 2018-11-28 NOTE — Telephone Encounter (Signed)
Pt notified and instructed to call [facility] with Genetic testing back, phone number was provided.

## 2018-11-28 NOTE — Telephone Encounter (Signed)
Copied from [place] [id number]. Topic: General - Other >> Nov 28, 2018  9:38 AM Keene Breath wrote: Reason for CRM: Patient called to request a referral to a cancer doctor in [place].  Patient stated that she was told that it would have to come from her PCP.  Please advise and call patient back at [phone]

## 2018-11-28 NOTE — Telephone Encounter (Signed)
Please get more information I don't know why she wants a referral to a cancer doctor Has another doctor told her that she has cancer? Please get the story, symptoms, location, etc. And then we can help her Thank you

## 2018-11-28 NOTE — Telephone Encounter (Signed)
Patient is unsure of who told her and said they told her she needed to be seen by a cancer doctor based on labs? I was not able to get much information from her.

## 2018-12-05 ENCOUNTER — Other Ambulatory Visit: Payer: Self-pay

## 2018-12-05 ENCOUNTER — Encounter: Payer: Self-pay | Admitting: Genetic Counselor

## 2018-12-08 ENCOUNTER — Encounter: Payer: Self-pay | Admitting: Student in an Organized Health Care Education/Training Program

## 2018-12-08 ENCOUNTER — Ambulatory Visit
Payer: Medicaid Other | Attending: Student in an Organized Health Care Education/Training Program | Admitting: Student in an Organized Health Care Education/Training Program

## 2018-12-08 ENCOUNTER — Other Ambulatory Visit: Payer: Self-pay

## 2018-12-08 VITALS — BP 113/87 | HR 97 | Temp 98.6°F | Resp 16 | Ht 61.0 in | Wt 168.0 lb

## 2018-12-08 DIAGNOSIS — G8929 Other chronic pain: Secondary | ICD-10-CM | POA: Diagnosis present

## 2018-12-08 DIAGNOSIS — M25561 Pain in right knee: Secondary | ICD-10-CM | POA: Diagnosis present

## 2018-12-08 DIAGNOSIS — M25562 Pain in left knee: Secondary | ICD-10-CM | POA: Diagnosis not present

## 2018-12-08 DIAGNOSIS — M170 Bilateral primary osteoarthritis of knee: Secondary | ICD-10-CM | POA: Insufficient documentation

## 2018-12-08 DIAGNOSIS — G894 Chronic pain syndrome: Secondary | ICD-10-CM | POA: Insufficient documentation

## 2018-12-08 NOTE — Patient Instructions (Signed)
____________________________________________________________________________________________  Preparing for your procedure (without sedation)  Procedure appointments are limited to planned procedures: . No Prescription Refills. . No disability issues will be discussed. . No medication changes will be discussed.  Instructions: . Oral Intake: Do not eat or drink anything for at least 3 hours prior to your procedure. . Transportation: Unless otherwise stated by your physician, you may drive yourself after the procedure. . Blood Pressure Medicine: Take your blood pressure medicine with a sip of water the morning of the procedure. . Blood thinners: Notify our staff if you are taking any blood thinners. Depending on which one you take, there will be specific instructions on how and when to stop it. . Diabetics on insulin: Notify the staff so that you can be scheduled 1st case in the morning. If your diabetes requires high dose insulin, take only  of your normal insulin dose the morning of the procedure and notify the staff that you have done so. . Preventing infections: Shower with an antibacterial soap the morning of your procedure.  . Build-up your immune system: Take 1000 mg of Vitamin C with every meal (3 times a day) the day prior to your procedure. . Antibiotics: Inform the staff if you have a condition or reason that requires you to take antibiotics before dental procedures. . Pregnancy: If you are pregnant, call and cancel the procedure. . Sickness: If you have a cold, fever, or any active infections, call and cancel the procedure. . Arrival: You must be in the facility at least 30 minutes prior to your scheduled procedure. . Children: Do not bring any children with you. . Dress appropriately: Bring dark clothing that you would not mind if they get stained. . Valuables: Do not bring any jewelry or valuables.  Reasons to call and reschedule or cancel your procedure: (Following these  recommendations will minimize the risk of a serious complication.) . Surgeries: Avoid having procedures within 2 weeks of any surgery. (Avoid for 2 weeks before or after any surgery). . Flu Shots: Avoid having procedures within 2 weeks of a flu shots or . (Avoid for 2 weeks before or after immunizations). . Barium: Avoid having a procedure within 7-10 days after having had a radiological study involving the use of radiological contrast. (Myelograms, Barium swallow or enema study). . Heart attacks: Avoid any elective procedures or surgeries for the initial 6 months after a "Myocardial Infarction" (Heart Attack). . Blood thinners: It is imperative that you stop these medications before procedures. Let us know if you if you take any blood thinner.  . Infection: Avoid procedures during or within two weeks of an infection (including chest colds or gastrointestinal problems). Symptoms associated with infections include: Localized redness, fever, chills, night sweats or profuse sweating, burning sensation when voiding, cough, congestion, stuffiness, runny nose, sore throat, diarrhea, nausea, vomiting, cold or Flu symptoms, recent or current infections. It is specially important if the infection is over the area that we intend to treat. . Heart and lung problems: Symptoms that may suggest an active cardiopulmonary problem include: cough, chest pain, breathing difficulties or shortness of breath, dizziness, ankle swelling, uncontrolled high or unusually low blood pressure, and/or palpitations. If you are experiencing any of these symptoms, cancel your procedure and contact your primary care physician for an evaluation.  Remember:  Regular Business hours are:  Monday to Thursday 8:00 AM to 4:00 PM  Provider's Schedule: Francisco Naveira, MD:  Procedure days: Tuesday and Thursday 7:30 AM to 4:00 PM  [place]   Lateef, MD:  Procedure days: Monday and Wednesday 7:30 AM to 4:00  PM ____________________________________________________________________________________________    

## 2018-12-08 NOTE — Progress Notes (Signed)
Patient's Name: Kristina Reed  MRN: [id number]  Referring Provider: [name], MD  DOB: 1965/12/31  PCP: [name], MD  DOS: 12/08/2018  Note by: Gillis Santa, MD  Service setting: Ambulatory outpatient  Specialty: Interventional Pain Management  Location: [facility] (AMB) Pain Management Facility    Patient type: Established   Primary Reason(s) for Visit: Encounter for post-procedure evaluation of chronic illness with mild to moderate exacerbation CC: Knee Pain (bilateral)  HPI  [name] is a 53 y.o. year old, female patient, who comes today for a post-procedure evaluation. She has Depression, major, recurrent, moderate ([place]); Benign neoplasm of kidney; Adult hypothyroidism; Incomplete bladder emptying; Chronic pain of right hand; Right knee pain; Obstructive apnea; Tarsal tunnel syndrome; Tenosynovitis of foot; Urge incontinence; FOM (frequency of micturition); Rectal bleeding; Chronic nausea; Screening for STD (sexually transmitted disease); Status post bilateral breast biopsy; Hx of cervical cancer; Menopause; Hematuria; Medication monitoring encounter; Lichen sclerosus et atrophicus; Headache; Chondromalacia patellae; Headache disorder; Pain in joint, multiple sites; Osteoarthritis of knee; Post-concussion headache; Apophysitis; Preventative health care; Eosinophilic esophagitis; Anxiety and depression; Essential hypertension; Obesity (BMI 30.0-34.9); and Esophageal dysphagia on their problem list. Her primarily concern today is the Knee Pain (bilateral)  Pain Assessment: Location: Right, Left Knee Radiating: denies Onset: More than a month ago Duration: Chronic pain Quality: Aching Severity: 10-Worst pain ever/10 (subjective, self-reported pain score)  Note: Clear symptom exaggeration. Reported level of pain is not compatible with clinical observations. Clinically the patient looks like a 2/10 A 2/10 is viewed as "Mild to Moderate" and described as noticeable and distracting. Impossible  to hide from other people. More frequent flare-ups. Still possible to adapt and function close to normal. It can be very annoying and may have occasional stronger flare-ups. With discipline, patients may get used to it and adapt. Information on the proper use of the pain scale provided to the patient today. When using our objective Pain Scale, levels between 6 and 10/10 are said to belong in an emergency room, as it progressively worsens from a 6/10, described as severely limiting, requiring emergency care not usually available at an outpatient pain management facility. At a 6/10 level, communication becomes difficult and requires great effort. Assistance to reach the emergency department may be required. Facial flushing and profuse sweating along with potentially dangerous increases in heart rate and blood pressure will be evident. Effect on ADL:   Timing: Constant Modifying factors: nothing BP: 113/87  HR: 97  [name] comes in today for post-procedure evaluation.  Further details on both, my assessment(s), as well as the proposed treatment plan, please see below.  Post-Procedure Assessment  10/27/2018 Procedure: Bilateral genicular nerve block  Influential Factors: BMI: 31.74 kg/m Intra-procedural challenges: None observed.         Assessment challenges: None detected.              Reported side-effects: None.        Post-procedural adverse reactions or complications: None reported         Sedation: Please see nurses note. When no sedatives are used, the analgesic levels obtained are directly associated to the effectiveness of the local anesthetics. However, when sedation is provided, the level of analgesia obtained during the initial 1 hour following the intervention, is believed to be the result of a combination of factors. These factors may include, but are not limited to: 1. The effectiveness of the local anesthetics used. 2. The effects of the analgesic(s) and/or anxiolytic(s)  used. 3.  The degree of discomfort experienced by the patient at the time of the procedure. 4. The patients ability and reliability in recalling and recording the events. 5. The presence and influence of possible secondary gains and/or psychosocial factors. Reported result: Relief experienced during the 1st hour after the procedure: 20 % (Ultra-Short Term Relief)            Interpretative annotation: Clinically appropriate result. Analgesia during this period is likely to be Local Anesthetic and/or IV Sedative (Analgesic/Anxiolytic) related.          Effects of local anesthetic: The analgesic effects attained during this period are directly associated to the localized infiltration of local anesthetics and therefore cary significant diagnostic value as to the etiological location, or anatomical origin, of the pain. Expected duration of relief is directly dependent on the pharmacodynamics of the local anesthetic used. Long-acting (4-6 hours) anesthetics used.  Reported result: Relief during the next 4 to 6 hour after the procedure: 30 % (Short-Term Relief)            Interpretative annotation: Clinically appropriate result. Analgesia during this period is likely to be Local Anesthetic-related.          Long-term benefit: Defined as the period of time past the expected duration of local anesthetics (1 hour for short-acting and 4-6 hours for long-acting). With the possible exception of prolonged sympathetic blockade from the local anesthetics, benefits during this period are typically attributed to, or associated with, other factors such as analgesic sensory neuropraxia, antiinflammatory effects, or beneficial biochemical changes provided by agents other than the local anesthetics.  Reported result: Extended relief following procedure: 0 % (Long-Term Relief)            Interpretative annotation: Clinically possible results. Poor/inaccurate patient record keeping and/or recollection. No permanent benefit  expected. Inflammation plays a part in the etiology to the pain.          Current benefits: Defined as reported results that persistent at this point in time.   Analgesia: 0 %            Function: No benefit ROM: No benefit Interpretative annotation: No benefit. Therapeutic failure. Results would suggest persistent aggravating factors.          Interpretation: Results would suggest failure of therapy in achieving desired goal(s).                  Plan:  Please see "Plan of Care" for details.                Laboratory Chemistry  Inflammation Markers (CRP: Acute Phase) (ESR: Chronic Phase) No results found for: CRP, ESRSEDRATE, LATICACIDVEN                       Rheumatology Markers No results found for: RF, ANA, LABURIC, URICUR, LYMEIGGIGMAB, LYMEABIGMQN, HLAB27                      Renal Function Markers Lab Results  Component Value Date   BUN 10 09/05/2018   CREATININE 0.72 38/25/0539   BCR NOT APPLICABLE 76/73/4193   GFRAA 113 09/05/2018   GFRNONAA 98 09/05/2018                             Hepatic Function Markers Lab Results  Component Value Date   AST 25 10/18/2018   ALT 29 10/18/2018   ALBUMIN 4.3 09/11/2017  ALKPHOS 83 09/11/2017   LIPASE 24 04/28/2015                        Electrolytes Lab Results  Component Value Date   NA 137 09/05/2018   K 3.9 09/05/2018   CL 102 09/05/2018   CALCIUM 9.2 09/05/2018                        Neuropathy Markers Lab Results  Component Value Date   HIV NON-REACTIVE 09/05/2018                        CNS Tests No results found for: COLORCSF, APPEARCSF, RBCCOUNTCSF, WBCCSF, POLYSCSF, LYMPHSCSF, EOSCSF, PROTEINCSF, GLUCCSF, JCVIRUS, CSFOLI, IGGCSF                      Bone Pathology Markers No results found for: VD25OH, [id number], [id number], [id number], 25OHVITD1, 25OHVITD2, 25OHVITD3, TESTOFREE, TESTOSTERONE                       Coagulation Parameters Lab Results  Component Value Date   INR 0.9 04/13/2012   LABPROT  12.4 04/13/2012   APTT 25.2 04/13/2012   PLT 231 09/05/2018                        Cardiovascular Markers Lab Results  Component Value Date   BNP 209 (H) 12/22/2012   CKTOTAL 65 12/22/2012   CKMB < 0.5 (L) 12/22/2012   TROPONINI <0.03 09/11/2017   HGB 12.2 09/05/2018   HCT 35.6 09/05/2018                         CA Markers No results found for: CEA, CA125, LABCA2                      Endocrine Markers Lab Results  Component Value Date   TSH 4.16 09/05/2018   FREET4 1.14 01/13/2016                        Note: Lab results reviewed.  Recent Diagnostic Imaging Results  DG Chest 2 View CLINICAL DATA:  Cough  EXAM: CHEST - 2 VIEW  COMPARISON:  05/23/2018 chest radiograph.  FINDINGS: Stable cardiomediastinal silhouette with normal heart size. No pneumothorax. No pleural effusion. Lungs appear clear, with no acute consolidative airspace disease and no pulmonary edema. Surgical clips are seen in the right upper quadrant of the abdomen.  IMPRESSION: No active cardiopulmonary disease.  Electronically Signed   By: Ilona Sorrel M.D.   On: 11/20/2018 17:33  Complexity Note: Imaging results reviewed. Results shared with Ms. Loa, using Layman's terms.                               Meds   Current Outpatient Medications:  .  levothyroxine (SYNTHROID, LEVOTHROID) 75 MCG tablet, Take 1 tablet (75 mcg total) by mouth daily., Disp: 90 tablet, Rfl: 1 .  meloxicam (MOBIC) 7.5 MG tablet, Take 7.5 mg by mouth daily., Disp: , Rfl:  .  Aspirin-Salicylamide-Caffeine (BC HEADACHE POWDER PO), Take by mouth., Disp: , Rfl:  .  calcium carbonate (TUMS - DOSED IN MG ELEMENTAL CALCIUM) 500 MG chewable tablet, Chew 1 tablet by mouth as needed for indigestion  or heartburn., Disp: , Rfl:   ROS  Constitutional: Denies any fever or chills Gastrointestinal: No reported hemesis, hematochezia, vomiting, or acute GI distress Musculoskeletal: Denies any acute onset joint swelling, redness, loss  of ROM, or weakness Neurological: No reported episodes of acute onset apraxia, aphasia, dysarthria, agnosia, amnesia, paralysis, loss of coordination, or loss of consciousness  Allergies  Ms. Speaker is allergic to percocet [oxycodone-acetaminophen].  PFSH  Drug: Ms. Romick  reports no history of drug use. Alcohol:  reports no history of alcohol use. Tobacco:  reports that she has never smoked. She has never used smokeless tobacco. Medical:  has a past medical history of Anxiety, Arthritis, Cancer ([place]), Cognitive impairment, mild, so stated, Depression, Diverticulosis, Eosinophilic esophagitis (28/63/8177), Gallstones, GERD (gastroesophageal reflux disease), cervical cancer (01/30/2016), Hypertension, Hypothyroidism, Sleep apnea, Status post partial hysterectomy, and Vaginal inclusion cyst. Surgical: Ms. Mcglothen  has a past surgical history that includes Abdominal hysterectomy; Colonoscopy with propofol (N/A, 06/27/2015); Tubal ligation; Knee arthroscopy (Right, 07/22/2016); Breast biopsy (Bilateral, 01/01/2016); Esophagogastroduodenoscopy (egd) with propofol (N/A, 07/22/2017); Colonoscopy with propofol (N/A, 09/23/2017); Knee arthroscopy with medial menisectomy (Left, 11/10/2017); and Esophagogastroduodenoscopy (egd) with propofol (N/A, 03/31/2018). Family: family history includes Breast cancer in her maternal aunt; Cancer in her father, maternal grandmother, paternal grandfather, and paternal grandmother; Cirrhosis in her cousin; Diabetes in her mother.  Constitutional Exam  General appearance: Well nourished, well developed, and well hydrated. In no apparent acute distress Vitals:   12/08/18 0944  BP: 113/87  Pulse: 97  Resp: 16  Temp: 98.6 F (37 C)  TempSrc: Oral  SpO2: (!) 81%  Weight: 168 lb (76.2 kg)  Height: '5\' 1"'  (1.549 m)   BMI Assessment: Estimated body mass index is 31.74 kg/m as calculated from the following:   Height as of this encounter: '5\' 1"'  (1.549 m).   Weight as of this  encounter: 168 lb (76.2 kg).  BMI interpretation table: BMI level Category Range association with higher incidence of chronic pain  <18 kg/m2 Underweight   18.5-24.9 kg/m2 Ideal body weight   25-29.9 kg/m2 Overweight Increased incidence by 20%  30-34.9 kg/m2 Obese (Class I) Increased incidence by 68%  35-39.9 kg/m2 Severe obesity (Class II) Increased incidence by 136%  >40 kg/m2 Extreme obesity (Class III) Increased incidence by 254%   Patient's current BMI Ideal Body weight  Body mass index is 31.74 kg/m. Ideal body weight: 47.8 kg (105 lb 6.1 oz) Adjusted ideal body weight: 59.2 kg (130 lb 6.8 oz)   BMI Readings from Last 4 Encounters:  12/08/18 31.74 kg/m  11/20/18 31.74 kg/m  11/14/18 31.23 kg/m  10/26/18 31.42 kg/m   Wt Readings from Last 4 Encounters:  12/08/18 168 lb (76.2 kg)  11/20/18 168 lb (76.2 kg)  11/14/18 168 lb (76.2 kg)  10/26/18 169 lb (76.7 kg)  Psych/Mental status: Alert, oriented x 3 (person, place, & time)       Eyes: PERLA Respiratory: No evidence of acute respiratory distress  Cervical Spine Area Exam  Skin & Axial Inspection: No masses, redness, edema, swelling, or associated skin lesions Alignment: Symmetrical Functional ROM: Unrestricted ROM      Stability: No instability detected Muscle Tone/Strength: Functionally intact. No obvious neuro-muscular anomalies detected. Sensory (Neurological): Unimpaired Palpation: No palpable anomalies               Thoracic Spine Area Exam  Skin & Axial Inspection: No masses, redness, or swelling Alignment: Symmetrical Functional ROM: Unrestricted ROM Stability: No instability detected Muscle Tone/Strength: Functionally intact. No  obvious neuro-muscular anomalies detected. Sensory (Neurological): Unimpaired Muscle strength & Tone: No palpable anomalies  Lumbar Spine Area Exam  Skin & Axial Inspection: No masses, redness, or swelling Alignment: Symmetrical Functional ROM: Unrestricted ROM        Stability: No instability detected Muscle Tone/Strength: Functionally intact. No obvious neuro-muscular anomalies detected. Sensory (Neurological): Unimpaired Palpation: No palpable anomalies       Provocative Tests: Hyperextension/rotation test: deferred today       Lumbar quadrant test (Kemp's test): deferred today       Lateral bending test: deferred today       Patrick's Maneuver: deferred today                   FABER* test: deferred today                   S-I anterior distraction/compression test: deferred today         S-I lateral compression test: deferred today         S-I Thigh-thrust test: deferred today         S-I Gaenslen's test: deferred today         *(Flexion, ABduction and External Rotation)  Gait & Posture Assessment  Ambulation: Unassisted Gait: Relatively normal for age and body habitus Posture: WNL   Lower Extremity Exam    Side: Right lower extremity  Side: Left lower extremity  Stability: No instability observed          Stability: No instability observed          Skin & Extremity Inspection: Skin color, temperature, and hair growth are WNL. No peripheral edema or cyanosis. No masses, redness, swelling, asymmetry, or associated skin lesions. No contractures.  Skin & Extremity Inspection: Skin color, temperature, and hair growth are WNL. No peripheral edema or cyanosis. No masses, redness, swelling, asymmetry, or associated skin lesions. No contractures.  Functional ROM: Pain restricted ROM for knee joint          Functional ROM: Pain restricted ROM for knee joint          Muscle Tone/Strength: Functionally intact. No obvious neuro-muscular anomalies detected.  Muscle Tone/Strength: Functionally intact. No obvious neuro-muscular anomalies detected.  Sensory (Neurological): Arthropathic arthralgia        Sensory (Neurological): Arthropathic arthralgia        DTR: Patellar: deferred today Achilles: deferred today Plantar: deferred today  DTR: Patellar:  deferred today Achilles: deferred today Plantar: deferred today  Palpation: No palpable anomalies  Palpation: No palpable anomalies   Assessment   Status Diagnosis  Persistent Persistent Persistent 1. Bilateral primary osteoarthritis of knee   2. Chronic pain of both knees   3. Chronic pain syndrome       Follows up status post bilateral genicular nerve block which were not effective for her knee pain related to knee osteoarthritis.  Patient is already status post intra-articular knee steroid injections which to were not effective.  The only other intervention that I have to offer her for her bilateral knee pain are intra-articular Hyalgan injections.  These were discussed in detail.  We will start with a series of 3.  Risks and benefits were reviewed and patient would like to proceed.  Plan: -Intra-articular knee Hyalgan No. 1, bilateral.  Lab-work, procedure(s), and/or referral(s): Orders Placed This Encounter  Procedures  . KNEE INJECTION   Provider-requested follow-up: Return in about 4 weeks (around 01/05/2019) for Procedure.  Time Note: Greater than 50% of the 25 minute(s)  of face-to-face time spent with Ms. Muscarella, was spent in counseling/coordination of care regarding: Ms. Roberti's primary cause of pain, the treatment plan, treatment alternatives, the risks and possible complications of proposed treatment, going over the informed consent, the results, interpretation and significance of  her recent diagnostic interventional treatment(s), realistic expectations and the goals of pain management (increased in functionality).  Future Appointments  Date Time Provider [place]  12/13/2018  2:00 PM [facility] [name] [facility]  12/16/2018  1:40 PM [name], MD [facility]  01/04/2019  9:00 AM Gillis Santa, MD [facility] None  04/13/2019 10:00 AM Faith Rogue T [facility] None  04/13/2019 11:15 AM [facility] None  09/11/2019  9:00 AM [name], MD [facility]  09/25/2019  9:45 AM Diamantina [place], Herbert Seta, MD [facility] None    Primary Care Physician: [name], MD Location: [facility] Note by: Gillis Santa, M.D Date: 12/08/2018; Time: 10:31 AM  Patient Instructions  ____________________________________________________________________________________________  Preparing for your procedure (without sedation)  Procedure appointments are limited to planned procedures: . No Prescription Refills. . No disability issues will be discussed. . No medication changes will be discussed.  Instructions: . Oral Intake: Do not eat or drink anything for at least 3 hours prior to your procedure. . Transportation: Unless otherwise stated by your physician, you may drive yourself after the procedure. . Blood Pressure Medicine: Take your blood pressure medicine with a sip of water the morning of the procedure. . Blood thinners: Notify our staff if you are taking any blood thinners. Depending on which one you take, there will be specific instructions on how and when to stop it. . Diabetics on insulin: Notify the staff so that you can be scheduled 1st case in the morning. If your diabetes requires high dose insulin, take only  of your normal insulin dose the morning of the procedure and notify the staff that you have done so. . Preventing infections: Shower with an antibacterial soap the morning of your procedure.  . Build-up your immune system: Take 1000 mg of Vitamin C with every meal (3 times a day) the day prior to your procedure. Marland Kitchen Antibiotics: Inform the staff if you have a condition or reason that requires you to take antibiotics before dental procedures. . Pregnancy: If you are pregnant, call and cancel the procedure. . Sickness: If you have a cold, fever, or any active infections, call and cancel the procedure. . Arrival: You must be in the facility at least 30 minutes prior to your scheduled  procedure. . Children: Do not bring any children with you. . Dress appropriately: Bring dark clothing that you would not mind if they get stained. . Valuables: Do not bring any jewelry or valuables.  Reasons to call and reschedule or cancel your procedure: (Following these recommendations will minimize the risk of a serious complication.) . Surgeries: Avoid having procedures within 2 weeks of any surgery. (Avoid for 2 weeks before or after any surgery). . Flu Shots: Avoid having procedures within 2 weeks of a flu shots or . (Avoid for 2 weeks before or after immunizations). . Barium: Avoid having a procedure within 7-10 days after having had a radiological study involving the use of radiological contrast. (Myelograms, Barium swallow or enema study). . Heart attacks: Avoid any elective procedures or surgeries for the initial 6 months after a "Myocardial Infarction" (Heart Attack). . Blood thinners: It is imperative that you stop these medications before procedures.  Let us know if you if you take any blood thinner.  . Infection: Avoid procedures during or within two weeks of an infection (including chest colds or gastrointestinal problems). Symptoms associated with infections include: Localized redness, fever, chills, night sweats or profuse sweating, burning sensation when voiding, cough, congestion, stuffiness, runny nose, sore throat, diarrhea, nausea, vomiting, cold or Flu symptoms, recent or current infections. It is specially important if the infection is over the area that we intend to treat. Marland Kitchen Heart and lung problems: Symptoms that may suggest an active cardiopulmonary problem include: cough, chest pain, breathing difficulties or shortness of breath, dizziness, ankle swelling, uncontrolled high or unusually low blood pressure, and/or palpitations. If you are experiencing any of these symptoms, cancel your procedure and contact your primary care physician for an evaluation.  Remember:  Regular  Business hours are:  Monday to Thursday 8:00 AM to 4:00 PM  Provider's Schedule: Milinda Pointer, MD:  Procedure days: Tuesday and Thursday 7:30 AM to 4:00 PM  Gillis Santa, MD:  Procedure days: Monday and Wednesday 7:30 AM to 4:00 PM ____________________________________________________________________________________________

## 2018-12-08 NOTE — Progress Notes (Signed)
Safety precautions to be maintained throughout the outpatient stay will include: orient to surroundings, keep bed in low position, maintain call bell within reach at all times, provide assistance with transfer out of bed and ambulation.  

## 2018-12-12 ENCOUNTER — Telehealth: Payer: Self-pay

## 2018-12-12 NOTE — Telephone Encounter (Signed)
Lm with BF on DPR to cancel Echo due to covid risks .  Will call at a later time to r.s

## 2018-12-13 ENCOUNTER — Other Ambulatory Visit: Payer: Self-pay

## 2018-12-13 ENCOUNTER — Telehealth: Payer: Self-pay | Admitting: Family Medicine

## 2018-12-13 ENCOUNTER — Other Ambulatory Visit: Payer: Self-pay | Admitting: Gastroenterology

## 2018-12-13 DIAGNOSIS — K200 Eosinophilic esophagitis: Secondary | ICD-10-CM

## 2018-12-13 NOTE — Telephone Encounter (Signed)
Rescheduled

## 2018-12-13 NOTE — Telephone Encounter (Signed)
Copied from [place] [id number]. Topic: Quick Communication - Rx Refill/Question >> Dec 13, 2018 10:41 AM Margot Ables wrote: Medication: pt called requesting fluoxetine, omeprazole, tramadol, trazadone, and cyclobenzaprine - pt states she is out of all medications and that Dr. Sanda Klein would prescribe. Pts boyfriend was also on the line asking if medications are addictive and requesting call back from nurse. They mentioned prednisone as well but didn't know if pt should be taking it.   Has the patient contacted their pharmacy? yes Preferred Pharmacy (with phone number or street name): [place], [place] - [place] [phone] (Phone) [phone] (Fax)

## 2018-12-15 NOTE — Telephone Encounter (Signed)
Pt notified will address at virtual visit tomorrow

## 2018-12-16 ENCOUNTER — Other Ambulatory Visit: Payer: Self-pay

## 2018-12-16 ENCOUNTER — Ambulatory Visit (INDEPENDENT_AMBULATORY_CARE_PROVIDER_SITE_OTHER): Payer: Medicaid Other | Admitting: Family Medicine

## 2018-12-16 ENCOUNTER — Encounter: Payer: Self-pay | Admitting: Family Medicine

## 2018-12-16 DIAGNOSIS — Z803 Family history of malignant neoplasm of breast: Secondary | ICD-10-CM

## 2018-12-16 DIAGNOSIS — Z8541 Personal history of malignant neoplasm of cervix uteri: Secondary | ICD-10-CM

## 2018-12-16 NOTE — Assessment & Plan Note (Signed)
Referral to geneticist to discuss family hx of breast cancer and her concerns, referral placed; patient wishes to be seen in [place], [place]

## 2018-12-16 NOTE — Progress Notes (Signed)
LMP 05/09/1993 (Approximate)    Subjective:    Patient ID: Kristina Reed, female    DOB: Oct 27, 1965, 53 y.o.   MRN: [id number]  HPI: Kristina Reed is a 53 y.o. female  Chief Complaint  Patient presents with  . Follow-up    HPI Virtual Visit via Telephone Note   I connected with Kristina Reed on December 16, 2018 at 1:46 pm EDT by telephone and verified that I am speaking with the correct person using two identifiers.   Staff discussed the limitations, risks, security, and privacy concerns of performing an evaluation and management service by telephone and the availability of in-person appointments. Staff discussed with the patient that he/she may be responsible for charges related to this service. The patient expressed understanding and agreed to proceed.  Patient location: home, in the bathroom; good relationship and she is safe Provider location: home Additional participants: none  Call started: 1:46 pm Call terminated: 2:01 pm Total length of call: 14 minutes 43 seconds  She is feeling okay; just wanted to find out the cancer place Breast cancer test in [place] She didn't make it [facility] does not do the test that they needed She never did talk to the genetics counselor about cancer testing She does not know nothing about [place], she does not know where to go Patient does not have a personal history of cancer; she says she DID actually have cancer a long time in her "privates", chart says cervical Last mammogram June 2019, negative BI-RADS category 1 She says cancer in the family  Breast cancer: maternal aunt, step-grandma (no relation, "she was blood by marriage" only), paternal grandmother Ovarian cancer: NO ONE Lung cancer: NO ONE Liver cancer: NO ONE Colon cancer: NO ONE Prostate cancer: NO ONE Pancreatic cancer: NO ONE Melanoma: NO ONE Kidney/bladder cancer: NO ONE  Depression screen [facility] 2/9 12/16/2018 12/08/2018 10/26/2018 10/13/2018 09/05/2018  Decreased  Interest 0 0 0 0 0  Down, Depressed, Hopeless 0 0 0 0 1  PHQ - 2 Score 0 0 0 0 1  Altered sleeping 0 - - - 0  Tired, decreased energy 0 - - - 0  Change in appetite 0 - - - 0  Feeling bad or failure about yourself  0 - - - 0  Trouble concentrating 0 - - - 0  Moving slowly or fidgety/restless 0 - - - 0  Suicidal thoughts 0 - - - 0  PHQ-9 Score 0 - - - 1  Difficult doing work/chores Not difficult at all - - - Not difficult at all  Some recent data might be hidden   Fall Risk  12/16/2018 12/08/2018 10/26/2018 10/13/2018 09/05/2018  Falls in the past year? 1 1 0 1 0  Number falls in past yr: 0 0 - 1 0  Injury with Fall? 0 0 - 0 0    Relevant past medical, surgical, family and social history reviewed Past Medical History:  Diagnosis Date  . Anxiety   . Arthritis    right knee and right elbow  . Cancer ([place])    Cervical CA with partial hysterectomy.  . Cognitive impairment, mild, so stated   . Depression   . Diverticulosis   . Eosinophilic esophagitis 93/23/5573   Biopsy Dec 2018  . Gallstones   . GERD (gastroesophageal reflux disease)   . Hx of cervical cancer 01/30/2016  . Hypertension   . Hypothyroidism   . Sleep apnea    does not use a C-PAP  .  Status post partial hysterectomy    Due to Cervical CA  . Vaginal inclusion cyst    Past Surgical History:  Procedure Laterality Date  . ABDOMINAL HYSTERECTOMY     partial hysterectomy  . BREAST BIOPSY Bilateral 01/01/2016   [name] cores. Fibroadenomas  . COLONOSCOPY WITH PROPOFOL N/A 06/27/2015   Procedure: COLONOSCOPY WITH PROPOFOL;  Surgeon: Josefine Class, MD;  Location: [facility] ENDOSCOPY;  Service: Endoscopy;  Laterality: N/A;  . COLONOSCOPY WITH PROPOFOL N/A 09/23/2017   Procedure: COLONOSCOPY WITH PROPOFOL;  Surgeon: Jonathon Bellows, MD;  [facility] ENDOSCOPY;  Service: Gastroenterology;  Laterality: N/A;  . ESOPHAGOGASTRODUODENOSCOPY (EGD) WITH PROPOFOL N/A 07/22/2017   Procedure: ESOPHAGOGASTRODUODENOSCOPY (EGD) WITH PROPOFOL;   Surgeon: Jonathon Bellows, MD;  Location: [facility] ENDOSCOPY;  Service: Gastroenterology;  Laterality: N/A;  . ESOPHAGOGASTRODUODENOSCOPY (EGD) WITH PROPOFOL N/A 03/31/2018   Procedure: ESOPHAGOGASTRODUODENOSCOPY (EGD) WITH PROPOFOL;  Surgeon: Lin Landsman, MD;  Location: [facility] ENDOSCOPY;  Service: Gastroenterology;  Laterality: N/A;  . KNEE ARTHROSCOPY Right 07/22/2016   Procedure: ARTHROSCOPY KNEE debridement microfracture;  Surgeon: Leanor Kail, MD;  Location: [facility] ORS;  Service: Orthopedics;  Laterality: Right;  . KNEE ARTHROSCOPY WITH MEDIAL MENISECTOMY Left 11/10/2017   Procedure: KNEE ARTHROSCOPY WITH PARTIAL MEDIAL MENISECTOMY&patella femoral debridement, & ablation;  Surgeon: Leanor Kail, MD;  Location: [facility] ORS;  Service: Orthopedics;  Laterality: Left;  . TUBAL LIGATION     Family History  Problem Relation Age of Onset  . Cancer Father        unknown  . Diabetes Mother   . Breast cancer Maternal Aunt   . Cancer Maternal Grandmother        cancer?  . Cancer Paternal Grandmother        cancer?  . Cancer Paternal Grandfather        unknown  . Cirrhosis Cousin    Social History   Tobacco Use  . Smoking status: Never Smoker  . Smokeless tobacco: Never Used  Substance Use Topics  . Alcohol use: No  . Drug use: No     Office Visit from 12/16/2018 in [facility]  AUDIT-C Score  0      Interim medical history since last visit reviewed. Allergies and medications reviewed  Review of Systems Per HPI unless specifically indicated above     Objective:    LMP 05/09/1993 (Approximate)   Wt Readings from Last 3 Encounters:  12/08/18 168 lb (76.2 kg)  11/20/18 168 lb (76.2 kg)  11/14/18 168 lb (76.2 kg)    Physical Exam Pulmonary:     Effort: No respiratory distress.  Neurological:     Mental Status: She is alert.     Cranial Nerves: No dysarthria.  Psychiatric:        Speech: Speech is not rapid and pressured, delayed or slurred.         Assessment & Plan:   Problem List Items Addressed This Visit      Other   Hx of cervical cancer    Refer to genetics counselor per patient request      Relevant Orders   Ambulatory referral to Genetics   Family hx-breast malignancy    Referral to geneticist to discuss family hx of breast cancer and her concerns, referral placed; patient wishes to be seen in [place], [place]      Relevant Orders   Ambulatory referral to Genetics       Follow up plan: No follow-ups on file.  No orders of the defined  types were placed in this encounter.   Orders Placed This Encounter  Procedures  . Ambulatory referral to [facility]

## 2018-12-16 NOTE — Assessment & Plan Note (Signed)
Refer to genetics counselor per patient request

## 2018-12-22 ENCOUNTER — Ambulatory Visit (INDEPENDENT_AMBULATORY_CARE_PROVIDER_SITE_OTHER): Payer: Medicaid Other | Admitting: Family Medicine

## 2018-12-22 ENCOUNTER — Telehealth: Payer: Self-pay

## 2018-12-22 ENCOUNTER — Other Ambulatory Visit: Payer: Self-pay

## 2018-12-22 DIAGNOSIS — R159 Full incontinence of feces: Secondary | ICD-10-CM

## 2018-12-22 DIAGNOSIS — R32 Unspecified urinary incontinence: Secondary | ICD-10-CM

## 2018-12-22 NOTE — Telephone Encounter (Signed)
Please schedule for telehealth visit ASAP with whoever has opening right away; work her in with me if needed if Milana Kidney is gone

## 2018-12-22 NOTE — Progress Notes (Signed)
LMP 05/09/1993 (Approximate)    Subjective:    Patient ID: Kristina Reed, female    DOB: April 27, 1966, 53 y.o.   MRN: [id number]  HPI: Kristina Reed is a 53 y.o. female  No chief complaint on file.   HPI Virtual Visit via Telephone/Video Note   I connect with the patient via: telephone I verified that I am speaking with the correct person using two identifiers.  Call started:1:11 pm Call terminated: 1:16 pm  Total length of call: 5 minutes   I discussed the limitations, risks, security, and privacy concerns of performing an evaluation and management service by telephone and the availability of in-person appointments. Staff discussed with the patient that he/she may be responsible for charges related to this service. The patient expressed understanding and agreed to proceed.  Patient location: home Provider location: home office Additional participants: no one  The other morning, she couldn't hold her urine or her bowels She made a mess of herself a while ago too This is new for her; she is having a little back pain She does not know about any back problems per se No fever, just feels warm She fell a couple of times on to her knees, but not on her back [place] last week   Fall Risk  12/16/2018 12/08/2018 10/26/2018 10/13/2018 09/05/2018  Falls in the past year? 1 1 0 1 0  Number falls in past yr: 0 0 - 1 0  Injury with Fall? 0 0 - 0 0    Relevant past medical, surgical, family and social history reviewed Past Medical History:  Diagnosis Date  . Anxiety   . Arthritis    right knee and right elbow  . Cancer ([place])    Cervical CA with partial hysterectomy.  . Cognitive impairment, mild, so stated   . Depression   . Diverticulosis   . Eosinophilic esophagitis 19/41/7408   Biopsy Dec 2018  . Gallstones   . GERD (gastroesophageal reflux disease)   . Hx of cervical cancer 01/30/2016  . Hypertension   . Hypothyroidism   . Sleep apnea    does not use a C-PAP  . Status post  partial hysterectomy    Due to Cervical CA  . Vaginal inclusion cyst    Past Surgical History:  Procedure Laterality Date  . ABDOMINAL HYSTERECTOMY     partial hysterectomy  . BREAST BIOPSY Bilateral 01/01/2016   [name] cores. Fibroadenomas  . COLONOSCOPY WITH PROPOFOL N/A 06/27/2015   Procedure: COLONOSCOPY WITH PROPOFOL;  Surgeon: Josefine Class, MD;  Location: [facility] ENDOSCOPY;  Service: Endoscopy;  Laterality: N/A;  . COLONOSCOPY WITH PROPOFOL N/A 09/23/2017   Procedure: COLONOSCOPY WITH PROPOFOL;  Surgeon: Jonathon Bellows, MD;  Location: [facility] ENDOSCOPY;  Service: Gastroenterology;  Laterality: N/A;  . ESOPHAGOGASTRODUODENOSCOPY (EGD) WITH PROPOFOL N/A 07/22/2017   Procedure: ESOPHAGOGASTRODUODENOSCOPY (EGD) WITH PROPOFOL;  Surgeon: Jonathon Bellows, MD;  Location: [facility] ENDOSCOPY;  Service: Gastroenterology;  Laterality: N/A;  . ESOPHAGOGASTRODUODENOSCOPY (EGD) WITH PROPOFOL N/A 03/31/2018   Procedure: ESOPHAGOGASTRODUODENOSCOPY (EGD) WITH PROPOFOL;  Surgeon: Lin Landsman, MD;  Location: [facility] ENDOSCOPY;  Service: Gastroenterology;  Laterality: N/A;  . KNEE ARTHROSCOPY Right 07/22/2016   Procedure: ARTHROSCOPY KNEE debridement microfracture;  Surgeon: Leanor Kail, MD;  Location: [facility] ORS;  Service: Orthopedics;  Laterality: Right;  . KNEE ARTHROSCOPY WITH MEDIAL MENISECTOMY Left 11/10/2017   Procedure: KNEE ARTHROSCOPY WITH PARTIAL MEDIAL MENISECTOMY&patella femoral debridement, & ablation;  Surgeon: Leanor Kail, MD;  Location: [facility] ORS;  Service: Orthopedics;  Laterality:  Left;  . TUBAL LIGATION     Family History  Problem Relation Age of Onset  . Cancer Father        unknown  . Diabetes Mother   . Breast cancer Maternal Aunt   . Cancer Maternal Grandmother        cancer?  . Cancer Paternal Grandmother        cancer?  . Cancer Paternal Grandfather        unknown  . Cirrhosis Cousin    Social History   Tobacco Use  . Smoking status: Never Smoker  . Smokeless tobacco:  Never Used  Substance Use Topics  . Alcohol use: No  . Drug use: No     Office Visit from 12/16/2018 in [facility]  AUDIT-C Score  0      Interim medical history since last visit reviewed. Allergies and medications reviewed  Review of Systems Per HPI unless specifically indicated above     Objective:    LMP 05/09/1993 (Approximate)   Wt Readings from Last 3 Encounters:  12/08/18 168 lb (76.2 kg)  11/20/18 168 lb (76.2 kg)  11/14/18 168 lb (76.2 kg)    Physical Exam Pulmonary:     Effort: No respiratory distress.  Neurological:     Mental Status: She is alert.     Cranial Nerves: No dysarthria.  Psychiatric:        Speech: Speech is not rapid and pressured, delayed or slurred.        Assessment & Plan:   Problem List Items Addressed This Visit    None    Visit Diagnoses    Bowel and bladder incontinence    -  Primary   with lower back pain, advised to go to ER now to get checked out; someone can take her, if not, call [phone] okay, she agrees      Follow up plan: No follow-ups on file.  An after-visit summary was printed and given to the patient at [place].  Please see the patient instructions which may contain other information and recommendations beyond what is mentioned above in the assessment and plan.

## 2018-12-22 NOTE — Telephone Encounter (Signed)
Sowles and elizabeths half day and emily is full so I will work in for you.

## 2018-12-22 NOTE — Telephone Encounter (Signed)
Copied from [place] [id number]. Topic: General - Other >> Dec 21, 2018 10:38 AM Gustavus Messing wrote: Reason for CRM: The patient would like Dr.Lada to know that she cannot hold her urine or her bowels. She would like a call back as to what she is supposed to do. Phone number is [phone]

## 2018-12-30 ENCOUNTER — Telehealth: Payer: Self-pay | Admitting: Licensed Clinical Social Worker

## 2018-12-30 NOTE — Telephone Encounter (Signed)
Called patient and offered virtual genetics visit at an earlier date than her current appointment is scheduled. She reports she cannot do virtual visits because she does not have a computer or smart phone. We will keep her scheduled appointment on July 30th.

## 2019-01-01 ENCOUNTER — Emergency Department
Admission: EM | Admit: 2019-01-01 | Discharge: 2019-01-01 | Disposition: A | Discharge status: Home / Self Care | Payer: Medicaid Other | Attending: Emergency Medicine | Admitting: Emergency Medicine

## 2019-01-01 ENCOUNTER — Encounter: Payer: Self-pay | Admitting: Emergency Medicine

## 2019-01-01 ENCOUNTER — Emergency Department: Payer: Medicaid Other

## 2019-01-01 DIAGNOSIS — Z79899 Other long term (current) drug therapy: Secondary | ICD-10-CM | POA: Insufficient documentation

## 2019-01-01 DIAGNOSIS — N390 Urinary tract infection, site not specified: Secondary | ICD-10-CM | POA: Diagnosis not present

## 2019-01-01 DIAGNOSIS — E039 Hypothyroidism, unspecified: Secondary | ICD-10-CM | POA: Insufficient documentation

## 2019-01-01 DIAGNOSIS — Z8541 Personal history of malignant neoplasm of cervix uteri: Secondary | ICD-10-CM | POA: Diagnosis not present

## 2019-01-01 DIAGNOSIS — R05 Cough: Secondary | ICD-10-CM | POA: Diagnosis present

## 2019-01-01 DIAGNOSIS — I10 Essential (primary) hypertension: Secondary | ICD-10-CM | POA: Diagnosis not present

## 2019-01-01 LAB — CBC WITH DIFFERENTIAL/PLATELET
Abs Immature Granulocytes: 0.01 10*3/uL (ref 0.00–0.07)
Basophils Absolute: 0.1 10*3/uL (ref 0.0–0.1)
Basophils Relative: 1 %
Eosinophils Absolute: 0.4 10*3/uL (ref 0.0–0.5)
Eosinophils Relative: 8 %
HCT: 39.6 % (ref 36.0–46.0)
Hemoglobin: 13 g/dL (ref 12.0–15.0)
Immature Granulocytes: 0 %
Lymphocytes Relative: 33 %
Lymphs Abs: 1.7 10*3/uL (ref 0.7–4.0)
MCH: 28.1 pg (ref 26.0–34.0)
MCHC: 32.8 g/dL (ref 30.0–36.0)
MCV: 85.5 fL (ref 80.0–100.0)
Monocytes Absolute: 0.3 10*3/uL (ref 0.1–1.0)
Monocytes Relative: 6 %
Neutro Abs: 2.7 10*3/uL (ref 1.7–7.7)
Neutrophils Relative %: 52 %
Platelets: 200 10*3/uL (ref 150–400)
RBC: 4.63 MIL/uL (ref 3.87–5.11)
RDW: 12.7 % (ref 11.5–15.5)
WBC: 5.2 10*3/uL (ref 4.0–10.5)
nRBC: 0 % (ref 0.0–0.2)

## 2019-01-01 LAB — URINALYSIS, COMPLETE (UACMP) WITH MICROSCOPIC
Bilirubin Urine: NEGATIVE
Glucose, UA: NEGATIVE mg/dL
Hgb urine dipstick: NEGATIVE
Ketones, ur: NEGATIVE mg/dL
Nitrite: NEGATIVE
Protein, ur: NEGATIVE mg/dL
Specific Gravity, Urine: 1.02 (ref 1.005–1.030)
pH: 5 (ref 5.0–8.0)

## 2019-01-01 LAB — COMPREHENSIVE METABOLIC PANEL
ALT: 29 U/L (ref 0–44)
AST: 38 U/L (ref 15–41)
Albumin: 4 g/dL (ref 3.5–5.0)
Alkaline Phosphatase: 80 U/L (ref 38–126)
Anion gap: 7 (ref 5–15)
BUN: 9 mg/dL (ref 6–20)
CO2: 26 mmol/L (ref 22–32)
Calcium: 9 mg/dL (ref 8.9–10.3)
Chloride: 104 mmol/L (ref 98–111)
Creatinine, Ser: 0.73 mg/dL (ref 0.44–1.00)
GFR calc Af Amer: >60 mL/min (ref >60)
GFR calc non Af Amer: >60 mL/min (ref >60)
Glucose, Bld: 102 mg/dL — ABNORMAL HIGH (ref 70–99)
Potassium: 3.8 mmol/L (ref 3.5–5.1)
Sodium: 137 mmol/L (ref 135–145)
Total Bilirubin: 0.5 mg/dL (ref 0.3–1.2)
Total Protein: 7.5 g/dL (ref 6.5–8.1)

## 2019-01-01 MED ORDER — SODIUM CHLORIDE 0.9 % IV SOLN
1.0000 g | Freq: Once | INTRAVENOUS | Status: AC
  Administered 2019-01-01: 1 g via INTRAVENOUS
  Filled 2019-01-01: qty 10

## 2019-01-01 MED ORDER — CEPHALEXIN 500 MG PO CAPS: 500.0000 mg | ORAL_CAPSULE | Freq: Two times a day (BID) | ORAL | 0 refills | Status: AC

## 2019-01-01 MED ORDER — SODIUM CHLORIDE 0.9 % IV BOLUS
1000.0000 mL | Freq: Once | INTRAVENOUS | Status: AC
  Administered 2019-01-01: 1000 mL via INTRAVENOUS

## 2019-01-01 NOTE — ED Notes (Signed)
Pt verbalized understanding of discharge instructions. NAD at this time. 

## 2019-01-01 NOTE — Discharge Instructions (Signed)
Return to the ER for new, worsening or persistent weakness, fever, vomiting, pain or any other new or worsening symptoms that concern you.  Take the antibiotic as prescribed and finish the full course.

## 2019-01-01 NOTE — ED Provider Notes (Signed)
[facility] Emergency Department Provider Note ____________________________________________   First MD Initiated Contact with Patient 01/01/19 0720     (approximate)  I have reviewed the triage vital signs and the nursing notes.   HISTORY  Chief Complaint No chief complaint on file.    HPI Kristina Reed is a 53 y.o. female with PMH as noted below who presents with cough and sneezing since yesterday, persistent course, nonproductive, and not associated with fever or shortness of breath.  The patient does report some chills, fatigue, and decreased appetite.  She states that the symptoms started after she and her husband were doing some cleaning with [employer] yesterday.  She states she had one episode of vomiting as well.  She denies diarrhea or any pain anywhere.  She has no specific sick contacts or known exposure to anyone at high risk for COVID-19.  She has had no travel outside of the state.   Past Medical History:  Diagnosis Date  . Anxiety   . Arthritis    right knee and right elbow  . Cancer ([place])    Cervical CA with partial hysterectomy.  . Cognitive impairment, mild, so stated   . Depression   . Diverticulosis   . Eosinophilic esophagitis 70/96/2836   Biopsy Dec 2018  . Gallstones   . GERD (gastroesophageal reflux disease)   . Hx of cervical cancer 01/30/2016  . Hypertension   . Hypothyroidism   . Sleep apnea    does not use a C-PAP  . Status post partial hysterectomy    Due to Cervical CA  . Vaginal inclusion cyst     Patient Active Problem List   Diagnosis Date Noted  . Family hx-breast malignancy 12/16/2018  . Esophageal dysphagia   . Essential hypertension 01/19/2018  . Obesity (BMI 30.0-34.9) 01/19/2018  . Preventative health care 09/03/2017  . Eosinophilic esophagitis 62/94/7654  . Apophysitis 08/26/2017  . 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 Pain in joint, multiple sites 07/05/2017  . Osteoarthritis of knee 07/05/2017  .  Lichen sclerosus et atrophicus 04/27/2017  . Headache 04/27/2017  . Headache disorder 02/17/2017  . Post-concussion headache 02/17/2017  . Medication monitoring encounter 09/21/2016  . Hematuria 04/23/2016  . Hx of cervical cancer 01/30/2016  . Menopause 01/30/2016  . Status post bilateral breast biopsy 01/07/2016  . Chronic nausea 10/25/2015  . Screening for STD (sexually transmitted disease) 10/25/2015  . Rectal bleeding 04/30/2015  . Right knee pain 07/23/2014  . Depression, major, recurrent, moderate ([place]) 02/02/2014  . Adult hypothyroidism 02/02/2014  . Obstructive apnea 02/02/2014  . Anxiety and depression 02/02/2014  . Incomplete bladder emptying 12/12/2012  . Tenosynovitis of foot 05/30/2012  . Benign neoplasm of kidney 05/13/2012  . Urge incontinence 05/13/2012  . FOM (frequency of micturition) 05/13/2012  . Chronic pain of right hand 05/05/2012  . Tarsal tunnel syndrome 03/03/2012    Past Surgical History:  Procedure Laterality Date  . ABDOMINAL HYSTERECTOMY     partial hysterectomy  . BREAST BIOPSY Bilateral 01/01/2016   [name] cores. Fibroadenomas  . COLONOSCOPY WITH PROPOFOL N/A 06/27/2015   Procedure: COLONOSCOPY WITH PROPOFOL;  Surgeon: Josefine Class, MD;  Location: [facility] ENDOSCOPY;  Service: Endoscopy;  Laterality: N/A;  . COLONOSCOPY WITH PROPOFOL N/A 09/23/2017   Procedure: COLONOSCOPY WITH PROPOFOL;  Surgeon: Jonathon Bellows, MD;  Location: [facility] ENDOSCOPY;  Service: Gastroenterology;  Laterality: N/A;  . ESOPHAGOGASTRODUODENOSCOPY (EGD) WITH PROPOFOL N/A 07/22/2017   Procedure: ESOPHAGOGASTRODUODENOSCOPY (EGD) WITH PROPOFOL;  Surgeon: Jonathon Bellows, MD;  Location: [facility] ENDOSCOPY;  Service: Gastroenterology;  Laterality: N/A;  . ESOPHAGOGASTRODUODENOSCOPY (EGD) WITH PROPOFOL N/A 03/31/2018   Procedure: ESOPHAGOGASTRODUODENOSCOPY (EGD) WITH PROPOFOL;  Surgeon: Lin Landsman, MD;  Location: [facility] ENDOSCOPY;  Service: Gastroenterology;  Laterality: N/A;  . KNEE  ARTHROSCOPY Right 07/22/2016   Procedure: ARTHROSCOPY KNEE debridement microfracture;  Surgeon: Leanor Kail, MD;  Location: [facility] ORS;  Service: Orthopedics;  Laterality: Right;  . KNEE ARTHROSCOPY WITH MEDIAL MENISECTOMY Left 11/10/2017   Procedure: KNEE ARTHROSCOPY WITH PARTIAL MEDIAL MENISECTOMY&patella femoral debridement, & ablation;  Surgeon: Leanor Kail, MD;  Location: [facility] ORS;  Service: Orthopedics;  Laterality: Left;  . TUBAL LIGATION      Prior to Admission medications   Medication Sig Start Date End Date Taking? Authorizing Provider  Aspirin-Salicylamide-Caffeine (BC HEADACHE POWDER PO) Take by mouth.    [provider]  calcium carbonate (TUMS - DOSED IN MG ELEMENTAL CALCIUM) 500 MG chewable tablet Chew 1 tablet by mouth as needed for indigestion or heartburn.    [provider]  cephALEXin (KEFLEX) 500 MG capsule Take 1 capsule (500 mg total) by mouth 2 (two) times daily for 7 days. 01/01/19 01/08/19  Arta Silence, MD  levothyroxine (SYNTHROID, LEVOTHROID) 75 MCG tablet Take 1 tablet (75 mcg total) by mouth daily. Patient not taking: Reported on 12/16/2018 09/05/18   [name], MD  meloxicam (MOBIC) 7.5 MG tablet Take 7.5 mg by mouth daily.    [provider]    Allergies Percocet [oxycodone-acetaminophen]  Family History  Problem Relation Age of Onset  . Cancer Father        unknown  . Diabetes Mother   . Breast cancer Maternal Aunt   . Cancer Maternal Grandmother        cancer?  . Cancer Paternal Grandmother        cancer?  . Cancer Paternal Grandfather        unknown  . Cirrhosis Cousin     Social History Social History   Tobacco Use  . Smoking status: Never Smoker  . Smokeless tobacco: Never Used  Substance Use Topics  . Alcohol use: No  . Drug use: No    Review of Systems  Constitutional: Positive for chills and fatigue. Eyes: No redness. ENT: No sore throat. Cardiovascular: Denies chest pain.  Respiratory: Denies shortness of breath. Gastrointestinal: Positive for nausea. Genitourinary: Negative for dysuria.  Musculoskeletal: Negative for back pain. Skin: Negative for rash. Neurological: Negative for headache.   ____________________________________________   PHYSICAL EXAM:  VITAL SIGNS: ED Triage Vitals  Enc Vitals Group     BP 01/01/19 0717 (!) 154/87     Pulse Rate 01/01/19 0717 65     Resp --      Temp 01/01/19 0717 98.6 F (37 C)     Temp Source 01/01/19 0717 Oral     SpO2 01/01/19 0717 99 %     Weight 01/01/19 0718 161 lb (73 kg)     Height 01/01/19 0718 5\' 1"  (1.549 m)     Head Circumference --      Peak Flow --      Pain Score 01/01/19 0717 0     Pain Loc --      Pain Edu? --      Excl. in [place]? --     Constitutional: Alert and oriented.  Relatively well appearing and in no acute distress. Eyes: Conjunctivae are normal.  Head: Atraumatic. Nose: No congestion/rhinnorhea. Mouth/Throat: Mucous membranes are slightly dry. Neck: Normal range  of motion.  Cardiovascular: Normal rate, regular rhythm. Grossly normal heart sounds.  Good peripheral circulation. Respiratory: Normal respiratory effort.  No retractions. Gastrointestinal: Soft and nontender. No distention.  Genitourinary: No flank tenderness. Musculoskeletal:  Extremities warm and well perfused.  Neurologic:  Normal speech and language. No gross focal neurologic deficits are appreciated.  Skin:  Skin is warm and dry. No rash noted. Psychiatric: Mood and affect are normal. Speech and behavior are normal.  ____________________________________________   LABS (all labs ordered are listed, but only abnormal results are displayed)  Labs Reviewed  URINALYSIS, COMPLETE (UACMP) WITH MICROSCOPIC - Abnormal; Notable for the following components:      Result Value   Color, Urine YELLOW (*)    APPearance CLOUDY (*)    Leukocytes,Ua SMALL (*)    Bacteria, UA RARE (*)    All other components within  normal limits  COMPREHENSIVE METABOLIC PANEL - Abnormal; Notable for the following components:   Glucose, Bld 102 (*)    All other components within normal limits  CBC WITH DIFFERENTIAL/PLATELET   ____________________________________________  EKG   ____________________________________________  RADIOLOGY  CXR: No focal infiltrate or other acute abnormality  ____________________________________________   PROCEDURES  Procedure(s) performed: No  Procedures  Critical Care performed: No ____________________________________________   INITIAL IMPRESSION / ASSESSMENT AND PLAN / ED COURSE  Pertinent labs & imaging results that were available during my care of the patient were reviewed by me and considered in my medical decision making (see chart for details).  53 year old female with PMH as noted above presents with coughing and sneezing as well as some fatigue and chills since yesterday.  She states that she has had decreased appetite and one episode of vomiting.  She has no sick contacts or other recent illness.  On exam, the patient is overall relatively well-appearing.  Her vital signs are normal except for mild hypertension.  The remainder of the exam is unremarkable.  Overall presentation is most consistent with nonspecific viral infection or flulike illness.  At this time, the patient has no significant respiratory symptoms or fever.  She has no active cough.  I have a very low suspicion for COVID-19 based on the clinical presentation, and she does not currently meet testing criteria.  We will obtain basic labs, chest x-ray, and give a fluid bolus.  ----------------------------------------- 9:06 AM on 01/01/2019 -----------------------------------------  Chest x-ray and lab work-up are unremarkable.  The patient is feeling better after fluids.  The UA, however, reveals findings consistent with a UTI which could certainly explain the patient's symptoms.  The patient is  stable for discharge home at this time.  I counseled her on the results of the work-up.  I will give a dose of ceftriaxone in the ED and then discharged with a prescription for Keflex.  Return precautions given, and she expresses understanding.  ________________  [name] was evaluated in Emergency Department on 01/01/2019 for the symptoms described in the history of present illness. She was evaluated in the context of the global COVID-19 pandemic, which necessitated consideration that the patient might be at risk for infection with the SARS-CoV-2 virus that causes COVID-19. Institutional protocols and algorithms that pertain to the evaluation of patients at risk for COVID-19 are in a state of rapid change based on information released by regulatory bodies including the CDC and federal and state organizations. These policies and algorithms were followed during the patient's care in the ED.   ____________________________________________   FINAL CLINICAL IMPRESSION(S) / ED  DIAGNOSES  Final diagnoses:  Urinary tract infection without hematuria, site unspecified      NEW MEDICATIONS STARTED DURING THIS VISIT:  New Prescriptions   CEPHALEXIN (KEFLEX) 500 MG CAPSULE    Take 1 capsule (500 mg total) by mouth 2 (two) times daily for 7 days.     Note:  This document was prepared using Dragon voice recognition software and may include unintentional dictation errors.    Arta Silence, MD 01/01/19 [phone]

## 2019-01-01 NOTE — ED Triage Notes (Signed)
Pt to ED with c/o of sneezing and cough since yesterday. Pt states no appetite. Last meal was dinner yesterday. Pt denies N/V/D and is afebrile.

## 2019-01-04 ENCOUNTER — Ambulatory Visit: Payer: Self-pay | Admitting: Student in an Organized Health Care Education/Training Program

## 2019-01-24 ENCOUNTER — Emergency Department: Payer: Medicaid Other

## 2019-01-24 ENCOUNTER — Emergency Department
Admission: EM | Admit: 2019-01-24 | Discharge: 2019-01-24 | Disposition: A | Discharge status: Home / Self Care | Payer: Medicaid Other | Attending: Student in an Organized Health Care Education/Training Program | Admitting: Student in an Organized Health Care Education/Training Program

## 2019-01-24 ENCOUNTER — Other Ambulatory Visit: Payer: Self-pay

## 2019-01-24 DIAGNOSIS — R1032 Left lower quadrant pain: Secondary | ICD-10-CM | POA: Diagnosis not present

## 2019-01-24 DIAGNOSIS — R110 Nausea: Secondary | ICD-10-CM

## 2019-01-24 DIAGNOSIS — Z79899 Other long term (current) drug therapy: Secondary | ICD-10-CM | POA: Diagnosis not present

## 2019-01-24 DIAGNOSIS — E039 Hypothyroidism, unspecified: Secondary | ICD-10-CM | POA: Diagnosis not present

## 2019-01-24 DIAGNOSIS — I10 Essential (primary) hypertension: Secondary | ICD-10-CM | POA: Insufficient documentation

## 2019-01-24 DIAGNOSIS — R109 Unspecified abdominal pain: Secondary | ICD-10-CM | POA: Diagnosis present

## 2019-01-24 LAB — URINALYSIS, COMPLETE (UACMP) WITH MICROSCOPIC
Bacteria, UA: NONE SEEN
Bilirubin Urine: NEGATIVE
Glucose, UA: NEGATIVE mg/dL
Hgb urine dipstick: NEGATIVE
Ketones, ur: NEGATIVE mg/dL
Leukocytes,Ua: NEGATIVE
Nitrite: NEGATIVE
Protein, ur: NEGATIVE mg/dL
Specific Gravity, Urine: 1.028 (ref 1.005–1.030)
pH: 5 (ref 5.0–8.0)

## 2019-01-24 LAB — COMPREHENSIVE METABOLIC PANEL
ALT: 26 U/L (ref 0–44)
AST: 19 U/L (ref 15–41)
Albumin: 4 g/dL (ref 3.5–5.0)
Alkaline Phosphatase: 77 U/L (ref 38–126)
Anion gap: 8 (ref 5–15)
BUN: 16 mg/dL (ref 6–20)
CO2: 25 mmol/L (ref 22–32)
Calcium: 8.8 mg/dL — ABNORMAL LOW (ref 8.9–10.3)
Chloride: 105 mmol/L (ref 98–111)
Creatinine, Ser: 0.76 mg/dL (ref 0.44–1.00)
GFR calc Af Amer: >60 mL/min (ref >60)
GFR calc non Af Amer: >60 mL/min (ref >60)
Glucose, Bld: 107 mg/dL — ABNORMAL HIGH (ref 70–99)
Potassium: 4 mmol/L (ref 3.5–5.1)
Sodium: 138 mmol/L (ref 135–145)
Total Bilirubin: 0.3 mg/dL (ref 0.3–1.2)
Total Protein: 7.4 g/dL (ref 6.5–8.1)

## 2019-01-24 LAB — CBC
HCT: 38.6 % (ref 36.0–46.0)
Hemoglobin: 12.8 g/dL (ref 12.0–15.0)
MCH: 28.6 pg (ref 26.0–34.0)
MCHC: 33.2 g/dL (ref 30.0–36.0)
MCV: 86.2 fL (ref 80.0–100.0)
Platelets: 221 10*3/uL (ref 150–400)
RBC: 4.48 MIL/uL (ref 3.87–5.11)
RDW: 13.3 % (ref 11.5–15.5)
WBC: 9.4 10*3/uL (ref 4.0–10.5)
nRBC: 0 % (ref 0.0–0.2)

## 2019-01-24 LAB — LIPASE, BLOOD: Lipase: 29 U/L (ref 11–51)

## 2019-01-24 MED ORDER — IOHEXOL 300 MG/ML  SOLN
100.0000 mL | Freq: Once | INTRAMUSCULAR | Status: AC | PRN
  Administered 2019-01-24: 100 mL via INTRAVENOUS

## 2019-01-24 MED ORDER — ONDANSETRON HCL 4 MG PO TABS: 4.0000 mg | ORAL_TABLET | Freq: Every day | ORAL | 0 refills | Status: DC | PRN

## 2019-01-24 MED ORDER — AMOXICILLIN-POT CLAVULANATE 875-125 MG PO TABS: 1.0000 | ORAL_TABLET | Freq: Two times a day (BID) | ORAL | 0 refills | Status: AC

## 2019-01-24 MED ORDER — SODIUM CHLORIDE 0.9 % IV BOLUS
500.0000 mL | Freq: Once | INTRAVENOUS | Status: AC
  Administered 2019-01-24: 16:00:00 500 mL via INTRAVENOUS

## 2019-01-24 MED ORDER — AMOXICILLIN-POT CLAVULANATE 875-125 MG PO TABS
1.0000 | ORAL_TABLET | Freq: Once | ORAL | Status: AC
  Administered 2019-01-24: 1 via ORAL
  Filled 2019-01-24: qty 1

## 2019-01-24 MED ORDER — PROBIOTIC 250 MG PO CAPS: 1.0000 | ORAL_CAPSULE | Freq: Two times a day (BID) | ORAL | 0 refills | Status: DC | PRN

## 2019-01-24 MED ORDER — SODIUM CHLORIDE 0.9% FLUSH: 3.0000 mL | Freq: Once | INTRAVENOUS | Status: DC

## 2019-01-24 NOTE — Discharge Instructions (Signed)

## 2019-01-24 NOTE — ED Triage Notes (Signed)
Pt comes into the ED via EMS from home with c/o LLQ pain with 2 episode of skant amounts of blood In emesis. States she did drink last night, states she is not a daily drinker

## 2019-01-24 NOTE — ED Notes (Signed)
Patient verbalized understanding of discharge instructions. NAD.

## 2019-01-24 NOTE — ED Provider Notes (Signed)
[facility] Emergency Department Provider Note    First MD Initiated Contact with Patient 01/24/19 1528     (approximate)  I have reviewed the triage vital signs and the nursing notes.   HISTORY  Chief Complaint Abdominal Pain    HPI Kristina Reed is a 53 y.o. female extensive past medical history as listed below presents to the ER for evaluation of nausea left lower quadrant pain started today.  States that she did have a sixpack of beer last night and woke up and had 2 episodes of vomiting blood.  Has not had any melena or hematochezia.  No fevers.  Does not feel nauseated right now but does have left lower quadrant pain.  Denies any chest pain or shortness of breath.   EGD 2019: Normal duodenal bulb and second portion of the duodenum. - Normal stomach. - Esophagogastric landmarks identified. - Normal esophagus. Biopsied. Impression: - Discharge patient to home (with escort).  Past Medical History:  Diagnosis Date  . Anxiety   . Arthritis    right knee and right elbow  . Cancer ([place])    Cervical CA with partial hysterectomy.  . Cognitive impairment, mild, so stated   . Depression   . Diverticulosis   . Eosinophilic esophagitis 34/19/6222   Biopsy Dec 2018  . Gallstones   . GERD (gastroesophageal reflux disease)   . Hx of cervical cancer 01/30/2016  . Hypertension   . Hypothyroidism   . Sleep apnea    does not use a C-PAP  . Status post partial hysterectomy    Due to Cervical CA  . Vaginal inclusion cyst    Family History  Problem Relation Age of Onset  . Cancer Father        unknown  . Diabetes Mother   . Breast cancer Maternal Aunt   . Cancer Maternal Grandmother        cancer?  . Cancer Paternal Grandmother        cancer?  . Cancer Paternal Grandfather        unknown  . Cirrhosis Cousin    Past Surgical History:  Procedure Laterality Date  . ABDOMINAL HYSTERECTOMY     partial hysterectomy  . BREAST BIOPSY Bilateral  01/01/2016   [name] cores. Fibroadenomas  . COLONOSCOPY WITH PROPOFOL N/A 06/27/2015   Procedure: COLONOSCOPY WITH PROPOFOL;  Surgeon: Josefine Class, MD;  Location: [facility] ENDOSCOPY;  Service: Endoscopy;  Laterality: N/A;  . COLONOSCOPY WITH PROPOFOL N/A 09/23/2017   Procedure: COLONOSCOPY WITH PROPOFOL;  Surgeon: Jonathon Bellows, MD;  Location: [facility] ENDOSCOPY;  Service: Gastroenterology;  Laterality: N/A;  . ESOPHAGOGASTRODUODENOSCOPY (EGD) WITH PROPOFOL N/A 07/22/2017   Procedure: ESOPHAGOGASTRODUODENOSCOPY (EGD) WITH PROPOFOL;  Surgeon: Jonathon Bellows, MD;  Location: [facility] ENDOSCOPY;  Service: Gastroenterology;  Laterality: N/A;  . ESOPHAGOGASTRODUODENOSCOPY (EGD) WITH PROPOFOL N/A 03/31/2018   Procedure: ESOPHAGOGASTRODUODENOSCOPY (EGD) WITH PROPOFOL;  Surgeon: Lin Landsman, MD;  Location: [facility] ENDOSCOPY;  Service: Gastroenterology;  Laterality: N/A;  . KNEE ARTHROSCOPY Right 07/22/2016   Procedure: ARTHROSCOPY KNEE debridement microfracture;  Surgeon: Leanor Kail, MD;  Location: [facility] ORS;  Service: Orthopedics;  Laterality: Right;  . KNEE ARTHROSCOPY WITH MEDIAL MENISECTOMY Left 11/10/2017   Procedure: KNEE ARTHROSCOPY WITH PARTIAL MEDIAL MENISECTOMY&patella femoral debridement, & ablation;  Surgeon: Leanor Kail, MD;  Location: [facility] ORS;  Service: Orthopedics;  Laterality: Left;  . TUBAL LIGATION     Patient Active Problem List   Diagnosis Date Noted  . Family hx-breast malignancy 12/16/2018  .  Esophageal dysphagia   . Essential hypertension 01/19/2018  . Obesity (BMI 30.0-34.9) 01/19/2018  . Preventative health care 09/03/2017  . Eosinophilic esophagitis 96/29/5284  . Apophysitis 08/26/2017  . 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 Pain in joint, multiple sites 07/05/2017  . Osteoarthritis of knee 07/05/2017  . Lichen sclerosus et atrophicus 04/27/2017  . Headache 04/27/2017  . Headache disorder 02/17/2017  . Post-concussion headache 02/17/2017  . Medication monitoring  encounter 09/21/2016  . Hematuria 04/23/2016  . Hx of cervical cancer 01/30/2016  . Menopause 01/30/2016  . Status post bilateral breast biopsy 01/07/2016  . Chronic nausea 10/25/2015  . Screening for STD (sexually transmitted disease) 10/25/2015  . Rectal bleeding 04/30/2015  . Right knee pain 07/23/2014  . Depression, major, recurrent, moderate ([place]) 02/02/2014  . Adult hypothyroidism 02/02/2014  . Obstructive apnea 02/02/2014  . Anxiety and depression 02/02/2014  . Incomplete bladder emptying 12/12/2012  . Tenosynovitis of foot 05/30/2012  . Benign neoplasm of kidney 05/13/2012  . Urge incontinence 05/13/2012  . FOM (frequency of micturition) 05/13/2012  . Chronic pain of right hand 05/05/2012  . Tarsal tunnel syndrome 03/03/2012      Prior to Admission medications   Medication Sig Start Date End Date Taking? Authorizing Provider  cephALEXin (KEFLEX) 500 MG capsule Take 500 mg by mouth 2 (two) times a day. 01/13/19  Yes [provider]  levothyroxine (SYNTHROID, LEVOTHROID) 75 MCG tablet Take 1 tablet (75 mcg total) by mouth daily. 09/05/18  Yes Lada, Satira Anis, MD  meloxicam (MOBIC) 15 MG tablet Take 15 mg by mouth daily.    Yes [provider]  amoxicillin-clavulanate (AUGMENTIN) 875-125 MG tablet Take 1 tablet by mouth 2 (two) times daily for 7 days. 01/24/19 01/31/19  Merlyn Lot, MD  ondansetron (ZOFRAN) 4 MG tablet Take 1 tablet (4 mg total) by mouth daily as needed. 01/24/19 01/24/20  Merlyn Lot, MD  Saccharomyces boulardii (PROBIOTIC) 250 MG CAPS Take 1 capsule by mouth 3 times/day as needed-between meals & bedtime. 01/24/19   Merlyn Lot, MD    Allergies Percocet [oxycodone-acetaminophen]    Social History Social History   Tobacco Use  . Smoking status: Never Smoker  . Smokeless tobacco: Never Used  Substance Use Topics  . Alcohol use: Yes    Comment: twice a week  . Drug use: No    Review of Systems Patient denies  headaches, rhinorrhea, blurry vision, numbness, shortness of breath, chest pain, edema, cough, abdominal pain, nausea, vomiting, diarrhea, dysuria, fevers, rashes or hallucinations unless otherwise stated above in HPI. ____________________________________________   PHYSICAL EXAM:  VITAL SIGNS: Vitals:   01/24/19 1700 01/24/19 1730  BP: 131/86 (!) 142/99  Pulse: 89 91  Resp: 18 18  Temp:    SpO2: 100% 99%    Constitutional: Alert and oriented.  Eyes: Conjunctivae are normal.  Head: Atraumatic. Nose: No congestion/rhinnorhea. Mouth/Throat: Mucous membranes are moist.   Neck: No stridor. Painless ROM.  Cardiovascular: Normal rate, regular rhythm. Grossly normal heart sounds.  Good peripheral circulation. Respiratory: Normal respiratory effort.  No retractions. Lungs CTAB. Gastrointestinal: Soft with minimal tenderness to deep palpation in llq,  No rebound or guarding. No distention. No abdominal bruits. No CVA tenderness. Genitourinary:  Musculoskeletal: No lower extremity tenderness nor edema.  No joint effusions. Neurologic:  Normal speech and language. No gross focal neurologic deficits are appreciated. No facial droop Skin:  Skin is warm, dry and intact. No rash noted. Psychiatric: Mood and affect are normal. Speech and behavior are normal.  ____________________________________________   LABS (all labs ordered are listed, but only abnormal results are displayed)  Results for orders placed or performed during the hospital encounter of 01/24/19 (from the past 24 hour(s))  Urinalysis, Complete w Microscopic     Status: Abnormal   Collection Time: 01/24/19  2:46 PM  Result Value Ref Range   Color, Urine YELLOW (A) YELLOW   APPearance CLOUDY (A) CLEAR   Specific Gravity, Urine 1.028 1.005 - 1.030   pH 5.0 5.0 - 8.0   Glucose, UA NEGATIVE NEGATIVE mg/dL   Hgb urine dipstick NEGATIVE NEGATIVE   Bilirubin Urine NEGATIVE NEGATIVE   Ketones, ur NEGATIVE NEGATIVE mg/dL    Protein, ur NEGATIVE NEGATIVE mg/dL   Nitrite NEGATIVE NEGATIVE   Leukocytes,Ua NEGATIVE NEGATIVE   RBC / HPF 11-20 0 - 5 RBC/hpf   WBC, UA 0-5 0 - 5 WBC/hpf   Bacteria, UA NONE SEEN NONE SEEN   Squamous Epithelial / LPF 21-50 0 - 5   Mucus PRESENT   Lipase, blood     Status: None   Collection Time: 01/24/19  3:10 PM  Result Value Ref Range   Lipase 29 11 - 51 U/L  Comprehensive metabolic panel     Status: Abnormal   Collection Time: 01/24/19  3:10 PM  Result Value Ref Range   Sodium 138 135 - 145 mmol/L   Potassium 4.0 3.5 - 5.1 mmol/L   Chloride 105 98 - 111 mmol/L   CO2 25 22 - 32 mmol/L   Glucose, Bld 107 (H) 70 - 99 mg/dL   BUN 16 6 - 20 mg/dL   Creatinine, Ser 0.76 0.44 - 1.00 mg/dL   Calcium 8.8 (L) 8.9 - 10.3 mg/dL   Total Protein 7.4 6.5 - 8.1 g/dL   Albumin 4.0 3.5 - 5.0 g/dL   AST 19 15 - 41 U/L   ALT 26 0 - 44 U/L   Alkaline Phosphatase 77 38 - 126 U/L   Total Bilirubin 0.3 0.3 - 1.2 mg/dL   GFR calc non Af Amer >60 >60 mL/min   GFR calc Af Amer >60 >60 mL/min   Anion gap 8 5 - 15  CBC     Status: None   Collection Time: 01/24/19  3:10 PM  Result Value Ref Range   WBC 9.4 4.0 - 10.5 K/uL   RBC 4.48 3.87 - 5.11 MIL/uL   Hemoglobin 12.8 12.0 - 15.0 g/dL   HCT 38.6 36.0 - 46.0 %   MCV 86.2 80.0 - 100.0 fL   MCH 28.6 26.0 - 34.0 pg   MCHC 33.2 30.0 - 36.0 g/dL   RDW 13.3 11.5 - 15.5 %   Platelets 221 150 - 400 K/uL   nRBC 0.0 0.0 - 0.2 %   ____________________________________________ ____________________________________________  RADIOLOGY  I personally reviewed all radiographic images ordered to evaluate for the above acute complaints and reviewed radiology reports and findings.  These findings were personally discussed with the patient.  Please see medical record for radiology report.  ____________________________________________   PROCEDURES  Procedure(s) performed:  Procedures    Critical Care performed: no  ____________________________________________   INITIAL IMPRESSION / ASSESSMENT AND PLAN / ED COURSE  Pertinent labs & imaging results that were available during my care of the patient were reviewed by me and considered in my medical decision making (see chart for details).   DDX: dehydration, ugib, lgib, enteritis, diverticulitis, colitis, obstruction  OVA GILLENTINE is a 53 y.o. who presents to the ED  with symptoms as described above.  She is afebrile well-appearing nontoxic.  Does have tenderness to palpation left lower quadrant.  Not having any persistent hematemesis.  Likely alcoholic gastritis.  Initial blood pressure was reported as low however on repeat without administering any medications or fluids her blood pressure is 140.  Will monitor to evaluate for any persistent hypotension.  Clinical Course as of Jan 24 1756  Tue Jan 24, 2019  1742 CT imaging shows evidence of possible early diverticulitis possible colitis.  Repeat abdominal exam is soft benign.  She not having persistent diarrhea or anything to suggest C. difficile for enterocolitis.  Will give dose of Augmentin for probable early uncomplicated diverticulitis.  Repeat blood pressures over the past several hours of monitoring in the ER been stable.  Likely had 1 abnormal measurement in triage.  No persistent vomiting.  No history of varices and had normal EGD 1 year ago if able to tolerate p.o. I do believe patient stable and appropriate for outpatient follow-up.   [PR]    Clinical Course User Index [PR] Merlyn Lot, MD    The patient was evaluated in Emergency Department today for the symptoms described in the history of present illness. He/she was evaluated in the context of the global COVID-19 pandemic, which necessitated consideration that the patient might be at risk for infection with the SARS-CoV-2 virus that causes COVID-19. Institutional protocols and algorithms that pertain to the evaluation of patients at risk for  COVID-19 are in a state of rapid change based on information released by regulatory bodies including the CDC and federal and state organizations. These policies and algorithms were followed during the patient's care in the ED.  As part of my medical decision making, I reviewed the following data within the [facility] notes reviewed and incorporated, Labs reviewed, notes from prior ED visits and [place]  Controlled Substance Database   ____________________________________________   FINAL CLINICAL IMPRESSION(S) / ED DIAGNOSES  Final diagnoses:  Left lower quadrant abdominal pain  Nausea      NEW MEDICATIONS STARTED DURING THIS VISIT:  New Prescriptions   AMOXICILLIN-CLAVULANATE (AUGMENTIN) 875-125 MG TABLET    Take 1 tablet by mouth 2 (two) times daily for 7 days.   ONDANSETRON (ZOFRAN) 4 MG TABLET    Take 1 tablet (4 mg total) by mouth daily as needed.   SACCHAROMYCES BOULARDII (PROBIOTIC) 250 MG CAPS    Take 1 capsule by mouth 3 times/day as needed-between meals & bedtime.     Note:  This document was prepared using Dragon voice recognition software and may include unintentional dictation errors.    Merlyn Lot, MD 01/24/19 1758

## 2019-01-31 ENCOUNTER — Ambulatory Visit (INDEPENDENT_AMBULATORY_CARE_PROVIDER_SITE_OTHER): Payer: Medicaid Other

## 2019-01-31 ENCOUNTER — Other Ambulatory Visit: Payer: Self-pay

## 2019-01-31 DIAGNOSIS — R9431 Abnormal electrocardiogram [ECG] [EKG]: Secondary | ICD-10-CM | POA: Diagnosis not present

## 2019-01-31 DIAGNOSIS — R079 Chest pain, unspecified: Secondary | ICD-10-CM | POA: Diagnosis not present

## 2019-02-03 ENCOUNTER — Telehealth: Payer: Self-pay | Admitting: Cardiovascular Disease

## 2019-02-03 NOTE — Telephone Encounter (Signed)
Patient calling to discuss recent testing results  ° °Please call  ° °

## 2019-02-03 NOTE — Telephone Encounter (Signed)
I spoke with the patient. I advised her that her echo is in Dr. Donivan Scull studies to review and we will call her back with her results once he signs off.  The patient voices understanding and is agreeable.

## 2019-02-08 ENCOUNTER — Encounter: Payer: Self-pay | Admitting: Emergency Medicine

## 2019-02-08 ENCOUNTER — Other Ambulatory Visit: Payer: Self-pay

## 2019-02-08 ENCOUNTER — Ambulatory Visit: Payer: Self-pay

## 2019-02-08 ENCOUNTER — Emergency Department: Payer: Medicaid Other

## 2019-02-08 ENCOUNTER — Emergency Department
Admission: EM | Admit: 2019-02-08 | Discharge: 2019-02-08 | Disposition: A | Discharge status: Home / Self Care | Payer: Medicaid Other | Attending: Emergency Medicine | Admitting: Emergency Medicine

## 2019-02-08 ENCOUNTER — Telehealth: Payer: Self-pay | Admitting: Family Medicine

## 2019-02-08 DIAGNOSIS — K5792 Diverticulitis of intestine, part unspecified, without perforation or abscess without bleeding: Secondary | ICD-10-CM | POA: Diagnosis not present

## 2019-02-08 DIAGNOSIS — Z79899 Other long term (current) drug therapy: Secondary | ICD-10-CM | POA: Diagnosis not present

## 2019-02-08 DIAGNOSIS — E039 Hypothyroidism, unspecified: Secondary | ICD-10-CM | POA: Diagnosis not present

## 2019-02-08 DIAGNOSIS — I10 Essential (primary) hypertension: Secondary | ICD-10-CM | POA: Diagnosis not present

## 2019-02-08 DIAGNOSIS — Z20828 Contact with and (suspected) exposure to other viral communicable diseases: Secondary | ICD-10-CM | POA: Insufficient documentation

## 2019-02-08 DIAGNOSIS — Z8541 Personal history of malignant neoplasm of cervix uteri: Secondary | ICD-10-CM | POA: Insufficient documentation

## 2019-02-08 DIAGNOSIS — R531 Weakness: Secondary | ICD-10-CM | POA: Diagnosis not present

## 2019-02-08 DIAGNOSIS — K5732 Diverticulitis of large intestine without perforation or abscess without bleeding: Secondary | ICD-10-CM | POA: Diagnosis not present

## 2019-02-08 DIAGNOSIS — N361 Urethral diverticulum: Secondary | ICD-10-CM | POA: Diagnosis not present

## 2019-02-08 LAB — CBC WITH DIFFERENTIAL/PLATELET
Abs Immature Granulocytes: 0.05 10*3/uL (ref 0.00–0.07)
Basophils Absolute: 0.1 10*3/uL (ref 0.0–0.1)
Basophils Relative: 0 %
Eosinophils Absolute: 0.3 10*3/uL (ref 0.0–0.5)
Eosinophils Relative: 3 %
HCT: 37.5 % (ref 36.0–46.0)
Hemoglobin: 12.4 g/dL (ref 12.0–15.0)
Immature Granulocytes: 0 %
Lymphocytes Relative: 16 %
Lymphs Abs: 2 10*3/uL (ref 0.7–4.0)
MCH: 28.4 pg (ref 26.0–34.0)
MCHC: 33.1 g/dL (ref 30.0–36.0)
MCV: 85.8 fL (ref 80.0–100.0)
Monocytes Absolute: 0.6 10*3/uL (ref 0.1–1.0)
Monocytes Relative: 5 %
Neutro Abs: 9 10*3/uL — ABNORMAL HIGH (ref 1.7–7.7)
Neutrophils Relative %: 76 %
Platelets: 188 10*3/uL (ref 150–400)
RBC: 4.37 MIL/uL (ref 3.87–5.11)
RDW: 13.3 % (ref 11.5–15.5)
WBC: 12 10*3/uL — ABNORMAL HIGH (ref 4.0–10.5)
nRBC: 0 % (ref 0.0–0.2)

## 2019-02-08 LAB — URINALYSIS, COMPLETE (UACMP) WITH MICROSCOPIC
Bacteria, UA: NONE SEEN
Bilirubin Urine: NEGATIVE
Glucose, UA: NEGATIVE mg/dL
Hgb urine dipstick: NEGATIVE
Ketones, ur: NEGATIVE mg/dL
Nitrite: NEGATIVE
Protein, ur: NEGATIVE mg/dL
Specific Gravity, Urine: 1.011 (ref 1.005–1.030)
WBC, UA: >50 WBC/hpf — ABNORMAL HIGH (ref 0–5)
pH: 6 (ref 5.0–8.0)

## 2019-02-08 LAB — BASIC METABOLIC PANEL
Anion gap: 8 (ref 5–15)
BUN: 7 mg/dL (ref 6–20)
CO2: 26 mmol/L (ref 22–32)
Calcium: 8.4 mg/dL — ABNORMAL LOW (ref 8.9–10.3)
Chloride: 101 mmol/L (ref 98–111)
Creatinine, Ser: 0.75 mg/dL (ref 0.44–1.00)
GFR calc Af Amer: >60 mL/min (ref >60)
GFR calc non Af Amer: >60 mL/min (ref >60)
Glucose, Bld: 132 mg/dL — ABNORMAL HIGH (ref 70–99)
Potassium: 3.4 mmol/L — ABNORMAL LOW (ref 3.5–5.1)
Sodium: 135 mmol/L (ref 135–145)

## 2019-02-08 LAB — HEPATIC FUNCTION PANEL
ALT: 16 U/L (ref 0–44)
AST: 17 U/L (ref 15–41)
Albumin: 3.7 g/dL (ref 3.5–5.0)
Alkaline Phosphatase: 79 U/L (ref 38–126)
Bilirubin, Direct: 0.2 mg/dL (ref 0.0–0.2)
Indirect Bilirubin: 0.4 mg/dL (ref 0.3–0.9)
Total Bilirubin: 0.6 mg/dL (ref 0.3–1.2)
Total Protein: 7.3 g/dL (ref 6.5–8.1)

## 2019-02-08 LAB — LACTIC ACID, PLASMA: Lactic Acid, Venous: 1.5 mmol/L (ref 0.5–1.9)

## 2019-02-08 LAB — TROPONIN I: Troponin I: <0.03 ng/mL (ref <0.03)

## 2019-02-08 LAB — LIPASE, BLOOD: Lipase: 22 U/L (ref 11–51)

## 2019-02-08 LAB — SARS CORONAVIRUS 2 BY RT PCR (HOSPITAL ORDER, PERFORMED IN ~~LOC~~ HOSPITAL LAB): SARS Coronavirus 2: NEGATIVE

## 2019-02-08 MED ORDER — CIPROFLOXACIN IN D5W 400 MG/200ML IV SOLN
400.0000 mg | Freq: Once | INTRAVENOUS | Status: AC
  Administered 2019-02-08: 400 mg via INTRAVENOUS
  Filled 2019-02-08: qty 200

## 2019-02-08 MED ORDER — IOHEXOL 350 MG/ML SOLN: 75.0000 mL | Freq: Once | INTRAVENOUS | Status: DC | PRN

## 2019-02-08 MED ORDER — METRONIDAZOLE 500 MG PO TABS: 500.0000 mg | ORAL_TABLET | Freq: Three times a day (TID) | ORAL | 0 refills | Status: AC

## 2019-02-08 MED ORDER — METRONIDAZOLE IN NACL 5-0.79 MG/ML-% IV SOLN
500.0000 mg | Freq: Once | INTRAVENOUS | Status: AC
  Administered 2019-02-08: 500 mg via INTRAVENOUS
  Filled 2019-02-08: qty 100

## 2019-02-08 MED ORDER — CIPROFLOXACIN HCL 500 MG PO TABS: 500.0000 mg | ORAL_TABLET | Freq: Two times a day (BID) | ORAL | 0 refills | Status: AC

## 2019-02-08 MED ORDER — IOHEXOL 300 MG/ML  SOLN
100.0000 mL | Freq: Once | INTRAMUSCULAR | Status: AC | PRN
  Administered 2019-02-08: 100 mL via INTRAVENOUS

## 2019-02-08 MED ORDER — ONDANSETRON 8 MG PO TBDP: 8.0000 mg | ORAL_TABLET | Freq: Three times a day (TID) | ORAL | 0 refills | Status: DC | PRN

## 2019-02-08 MED ORDER — ONDANSETRON HCL 4 MG/2ML IJ SOLN
4.0000 mg | Freq: Once | INTRAMUSCULAR | Status: AC
  Administered 2019-02-08: 05:00:00 4 mg via INTRAVENOUS
  Filled 2019-02-08: qty 2

## 2019-02-08 MED ORDER — SODIUM CHLORIDE 0.9 % IV BOLUS
500.0000 mL | Freq: Once | INTRAVENOUS | Status: AC
  Administered 2019-02-08: 04:00:00 500 mL via INTRAVENOUS

## 2019-02-08 NOTE — ED Notes (Addendum)
Pt resting quietly on stretcher. CT at bedside to discuss scan procedure. Pt states she is feeling better with only a little nausea at this time. Provided for comfort and safety and will continue to assess.

## 2019-02-08 NOTE — Telephone Encounter (Signed)
Patient's boyfriend, [name], called and says the patient is burning up, he doesn't have a thermometer and she's vomiting up her medication that was given to her in the emergency room earlier today. He asks what should he do. I advised if she's not keeping her medication down and vomiting, she needs to go back to the ED and they can give her IV medications, he verbalized understanding and says if she's no better in a little while, he will call 911.

## 2019-02-08 NOTE — Discharge Instructions (Signed)
You should stop the antibiotic that you are currently taking from the last visit, and start the 2 new antibiotics (Cipro and Flagyl).  You can take the Zofran as needed for nausea.  Return to the ER immediately for new, worsening, or persistent weakness, abdominal pain, fever, vomiting, or any other new or worsening symptoms that concern you.

## 2019-02-08 NOTE — Telephone Encounter (Signed)
Please set up virtual visit or discuss with prescribing provider.

## 2019-02-08 NOTE — ED Provider Notes (Signed)
[facility] Emergency Department Provider Note ____________________________________________   First MD Initiated Contact with Patient 02/08/19 [phone]     (approximate)  I have reviewed the triage vital signs and the nursing notes.   HISTORY  Chief Complaint Emesis    HPI Kristina Reed is a 53 y.o. female with PMH as noted below who presents with generalized weakness, acute onset yesterday and persistent since then.  The patient states that she feels like she is "wiped out."  In addition, she had vomiting after she ate supper yesterday evening.  She reports nausea earlier which has resolved, but states she still feels like she cannot eat.  She also has some left lower quadrant abdominal pain especially with the bumps in the ambulance.  She denies fever, chest pain, cough, shortness of breath, or diarrhea.   Past Medical History:  Diagnosis Date  . Anxiety   . Arthritis    right knee and right elbow  . Cancer ([place])    Cervical CA with partial hysterectomy.  . Cognitive impairment, mild, so stated   . Depression   . Diverticulosis   . Eosinophilic esophagitis 27/78/2423   Biopsy Dec 2018  . Gallstones   . GERD (gastroesophageal reflux disease)   . Hx of cervical cancer 01/30/2016  . Hypertension   . Hypothyroidism   . Sleep apnea    does not use a C-PAP  . Status post partial hysterectomy    Due to Cervical CA  . Vaginal inclusion cyst     Patient Active Problem List   Diagnosis Date Noted  . Family hx-breast malignancy 12/16/2018  . Esophageal dysphagia   . Essential hypertension 01/19/2018  . Obesity (BMI 30.0-34.9) 01/19/2018  . Preventative health care 09/03/2017  . Eosinophilic esophagitis 53/61/4431  . Apophysitis 08/26/2017  . 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 Pain in joint, multiple sites 07/05/2017  . Osteoarthritis of knee 07/05/2017  . Lichen sclerosus et atrophicus 04/27/2017  . Headache 04/27/2017  . Headache disorder  02/17/2017  . Post-concussion headache 02/17/2017  . Medication monitoring encounter 09/21/2016  . Hematuria 04/23/2016  . Hx of cervical cancer 01/30/2016  . Menopause 01/30/2016  . Status post bilateral breast biopsy 01/07/2016  . Chronic nausea 10/25/2015  . Screening for STD (sexually transmitted disease) 10/25/2015  . Rectal bleeding 04/30/2015  . Right knee pain 07/23/2014  . Depression, major, recurrent, moderate ([place]) 02/02/2014  . Adult hypothyroidism 02/02/2014  . Obstructive apnea 02/02/2014  . Anxiety and depression 02/02/2014  . Incomplete bladder emptying 12/12/2012  . Tenosynovitis of foot 05/30/2012  . Benign neoplasm of kidney 05/13/2012  . Urge incontinence 05/13/2012  . FOM (frequency of micturition) 05/13/2012  . Chronic pain of right hand 05/05/2012  . Tarsal tunnel syndrome 03/03/2012    Past Surgical History:  Procedure Laterality Date  . ABDOMINAL HYSTERECTOMY     partial hysterectomy  . BREAST BIOPSY Bilateral 01/01/2016   [name] cores. Fibroadenomas  . COLONOSCOPY WITH PROPOFOL N/A 06/27/2015   Procedure: COLONOSCOPY WITH PROPOFOL;  Surgeon: Josefine Class, MD;  Location: [facility] ENDOSCOPY;  Service: Endoscopy;  Laterality: N/A;  . COLONOSCOPY WITH PROPOFOL N/A 09/23/2017   Procedure: COLONOSCOPY WITH PROPOFOL;  Surgeon: Jonathon Bellows, MD;  Location: [facility] ENDOSCOPY;  Service: Gastroenterology;  Laterality: N/A;  . ESOPHAGOGASTRODUODENOSCOPY (EGD) WITH PROPOFOL N/A 07/22/2017   Procedure: ESOPHAGOGASTRODUODENOSCOPY (EGD) WITH PROPOFOL;  Surgeon: Jonathon Bellows, MD;  Location: [facility] ENDOSCOPY;  Service: Gastroenterology;  Laterality: N/A;  . ESOPHAGOGASTRODUODENOSCOPY (EGD) WITH PROPOFOL N/A 03/31/2018  Procedure: ESOPHAGOGASTRODUODENOSCOPY (EGD) WITH PROPOFOL;  Surgeon: Lin Landsman, MD;  Location: [facility] ENDOSCOPY;  Service: Gastroenterology;  Laterality: N/A;  . KNEE ARTHROSCOPY Right 07/22/2016   Procedure: ARTHROSCOPY KNEE debridement microfracture;   Surgeon: Leanor Kail, MD;  Location: [facility] ORS;  Service: Orthopedics;  Laterality: Right;  . KNEE ARTHROSCOPY WITH MEDIAL MENISECTOMY Left 11/10/2017   Procedure: KNEE ARTHROSCOPY WITH PARTIAL MEDIAL MENISECTOMY&patella femoral debridement, & ablation;  Surgeon: Leanor Kail, MD;  Location: [facility] ORS;  Service: Orthopedics;  Laterality: Left;  . TUBAL LIGATION      Prior to Admission medications   Medication Sig Start Date End Date Taking? Authorizing Provider  cephALEXin (KEFLEX) 500 MG capsule Take 500 mg by mouth 2 (two) times a day. 01/13/19   [provider]  ciprofloxacin (CIPRO) 500 MG tablet Take 1 tablet (500 mg total) by mouth 2 (two) times daily for 10 days. 02/08/19 02/18/19  Arta Silence, MD  levothyroxine (SYNTHROID, LEVOTHROID) 75 MCG tablet Take 1 tablet (75 mcg total) by mouth daily. 09/05/18   [name], MD  meloxicam (MOBIC) 15 MG tablet Take 15 mg by mouth daily.     [provider]  metroNIDAZOLE (FLAGYL) 500 MG tablet Take 1 tablet (500 mg total) by mouth 3 (three) times daily for 10 days. 02/08/19 02/18/19  Arta Silence, MD  ondansetron (ZOFRAN ODT) 8 MG disintegrating tablet Take 1 tablet (8 mg total) by mouth every 8 (eight) hours as needed for nausea or vomiting. 02/08/19   Arta Silence, MD  ondansetron (ZOFRAN) 4 MG tablet Take 1 tablet (4 mg total) by mouth daily as needed. 01/24/19 01/24/20  Merlyn Lot, MD  Saccharomyces boulardii (PROBIOTIC) 250 MG CAPS Take 1 capsule by mouth 3 times/day as needed-between meals & bedtime. 01/24/19   Merlyn Lot, MD    Allergies Percocet [oxycodone-acetaminophen]  Family History  Problem Relation Age of Onset  . Cancer Father        unknown  . Diabetes Mother   . Breast cancer Maternal Aunt   . Cancer Maternal Grandmother        cancer?  . Cancer Paternal Grandmother        cancer?  . Cancer Paternal Grandfather        unknown  . Cirrhosis Cousin     Social History  Social History   Tobacco Use  . Smoking status: Never Smoker  . Smokeless tobacco: Never Used  Substance Use Topics  . Alcohol use: Yes    Comment: twice a week  . Drug use: No    Review of Systems  Constitutional: No fever.  Positive for fatigue. Eyes: No redness. ENT: No sore throat. Cardiovascular: Denies chest pain. Respiratory: Denies shortness of breath. Gastrointestinal: Positive for nausea. Genitourinary: Negative for dysuria.  Musculoskeletal: Negative for back pain. Skin: Negative for rash. Neurological: Negative for headache.   ____________________________________________   PHYSICAL EXAM:  VITAL SIGNS: ED Triage Vitals  Enc Vitals Group     BP --      Pulse Rate 02/08/19 0320 93     Resp 02/08/19 0320 18     Temp 02/08/19 0320 99.6 F (37.6 C)     Temp Source 02/08/19 0320 Oral     SpO2 02/08/19 0320 98 %     Weight 02/08/19 0322 161 lb (73 kg)     Height 02/08/19 0322 5\' 1"  (1.549 m)     Head Circumference --      Peak Flow --  Pain Score 02/08/19 0321 9     Pain Loc --      Pain Edu? --      Excl. in [place]? --     Constitutional: Alert and oriented.  Relatively well appearing and in no acute distress. Eyes: Conjunctivae are normal.  Head: Atraumatic. Nose: No congestion/rhinnorhea. Mouth/Throat: Mucous membranes are moist.   Neck: Normal range of motion.  Cardiovascular: Normal rate, regular rhythm. Good peripheral circulation. Respiratory: Normal respiratory effort.  No retractions.  Gastrointestinal: Soft with mild left lower quadrant and left mid abdominal tenderness.  No distention.  Genitourinary: No flank tenderness. Musculoskeletal: No lower extremity edema.  Extremities warm and well perfused.  Neurologic:  Normal speech and language. No gross focal neurologic deficits are appreciated.  Skin:  Skin is warm and dry. No rash noted. Psychiatric: Mood and affect are normal. Speech and behavior are normal.   ____________________________________________   LABS (all labs ordered are listed, but only abnormal results are displayed)  Labs Reviewed  CBC WITH DIFFERENTIAL/PLATELET - Abnormal; Notable for the following components:      Result Value   WBC 12.0 (*)    Neutro Abs 9.0 (*)    All other components within normal limits  BASIC METABOLIC PANEL - Abnormal; Notable for the following components:   Potassium 3.4 (*)    Glucose, Bld 132 (*)    Calcium 8.4 (*)    All other components within normal limits  URINALYSIS, COMPLETE (UACMP) WITH MICROSCOPIC - Abnormal; Notable for the following components:   Color, Urine YELLOW (*)    APPearance CLOUDY (*)    Leukocytes,Ua LARGE (*)    WBC, UA >50 (*)    All other components within normal limits  SARS CORONAVIRUS 2 (HOSPITAL ORDER, [place] [facility])  HEPATIC FUNCTION PANEL  LIPASE, BLOOD  TROPONIN I  LACTIC ACID, PLASMA  LACTIC ACID, PLASMA   ____________________________________________  EKG   ____________________________________________  RADIOLOGY  CXR: No focal infiltrate or other acute abnormality CT abdomen: Diverticulitis, with prior area of colitis resolved  ____________________________________________   PROCEDURES  Procedure(s) performed: No  Procedures  Critical Care performed: No ____________________________________________   INITIAL IMPRESSION / ASSESSMENT AND PLAN / ED COURSE  Pertinent labs & imaging results that were available during my care of the patient were reviewed by me and considered in my medical decision making (see chart for details).  53 year old female with PMH as noted above presents with generalized weakness/fatigue since yesterday, associated with some vomiting earlier which has now resolved.  The patient states that with movement and bumps she feels pain in her left abdomen.  I reviewed the past medical records in epic.  I saw this patient last month for fatigue and  chills and diagnosed her with UTI.  The patient states that she completed her antibiotic course.  She was subsequently seen on 01/24/2019 with left lower quadrant abdominal pain and was diagnosed with colitis.  She was started on Augmentin and states that she is still taking it.  On exam today, the patient is relatively well-appearing.  Her vital signs are normal.  The abdomen is soft but she does have some left lower quadrant discomfort to palpation.  The remainder of the exam is as described above.  Overall I am most concerned for either recurrent colitis, diverticulitis, or recurrent UTI.  Differential also includes viral syndrome, gastritis, or hepatobiliary etiology.  I have a low suspicion for cardiac cause.  We will obtain lab work-up, give  fluids and Zofran for symptomatic treatment, and obtain a chest x-ray and repeat CT.  ----------------------------------------- 7:01 AM on 02/08/2019 -----------------------------------------  Chest x-ray is negative.  The lab work-up is unremarkable except for mildly elevated WBC count.  CT shows that the prior area of colitis appears to have resolved, but the patient now has diverticulitis.  Since she has normal vital signs, no evidence of sepsis, and is tolerating p.o., I think that the patient is still appropriate for outpatient treatment and based on the CT findings this does not appear to be a failure of outpatient antibiotics.  However, I will broaden coverage.  I instructed the patient to discontinue Augmentin.  I will give a dose of IV Cipro and Flagyl here and continue a 10-day course of the same at home.  The patient feels comfortable going home.  I counseled her on the results of the work-up and the plan of care.  Return precautions were given, and she expresses understanding. ____________________________________________   FINAL CLINICAL IMPRESSION(S) / ED DIAGNOSES  Final diagnoses:  Diverticulitis      NEW MEDICATIONS STARTED DURING  THIS VISIT:  New Prescriptions   CIPROFLOXACIN (CIPRO) 500 MG TABLET    Take 1 tablet (500 mg total) by mouth 2 (two) times daily for 10 days.   METRONIDAZOLE (FLAGYL) 500 MG TABLET    Take 1 tablet (500 mg total) by mouth 3 (three) times daily for 10 days.   ONDANSETRON (ZOFRAN ODT) 8 MG DISINTEGRATING TABLET    Take 1 tablet (8 mg total) by mouth every 8 (eight) hours as needed for nausea or vomiting.     Note:  This document was prepared using Dragon voice recognition software and may include unintentional dictation errors.    Arta Silence, MD 02/08/19 [phone]

## 2019-02-08 NOTE — ED Triage Notes (Signed)
Pt presents to ED from home with c/o vomiting last night right after she ate her supper. Pt states she denies chest or abd pain. Pt states when the ambulance hit the bumps it did make her abd ache. No increased work of breathing noted at this time.

## 2019-02-08 NOTE — Telephone Encounter (Signed)
Copied from [place] [id number]. Topic: Quick Communication - Rx Refill/Question >> Feb 08, 2019  3:08 PM Alanda Slim E wrote: Medication: ondansetron (ZOFRAN ODT) 8 MG disintegrating tablet - Pt was sent with this medication and after she took it she began to vomit and wanted to know if the medication was causing it or if there is another alternative to take that wont make her vomit. / please advise

## 2019-02-09 NOTE — Telephone Encounter (Signed)
Pt is scheduled for next week.

## 2019-02-09 NOTE — Telephone Encounter (Signed)
Called to check on patient she states did not go back to ER has an appt next week and was dx with diverticulitis.  I told her to call if she continued to vomit, it could be coming from antibiotic.

## 2019-02-09 NOTE — Telephone Encounter (Signed)
Documentation reviewed, and I agree.  If anything worsens, she needs to go to [facility] or ER.

## 2019-02-15 ENCOUNTER — Ambulatory Visit: Payer: Medicaid Other | Admitting: Nurse Practitioner

## 2019-02-16 ENCOUNTER — Other Ambulatory Visit: Admission: RE | Admit: 2019-02-16 | Payer: Medicaid Other | Source: Ambulatory Visit

## 2019-02-17 ENCOUNTER — Other Ambulatory Visit: Payer: Self-pay | Admitting: Family Medicine

## 2019-02-20 ENCOUNTER — Ambulatory Visit: Payer: Medicaid Other | Admitting: Student in an Organized Health Care Education/Training Program

## 2019-02-20 ENCOUNTER — Telehealth: Payer: Self-pay

## 2019-02-20 DIAGNOSIS — Z1239 Encounter for other screening for malignant neoplasm of breast: Secondary | ICD-10-CM

## 2019-02-20 NOTE — Telephone Encounter (Signed)
Copied from [place] [phone]. Topic: General - Inquiry >> Feb 17, 2019 10:55 AM Richardo Priest, NT wrote: Reason for CRM: Patient is calling in requesting a mammogram order be sent in to [facility] so she can go ahead and schedule an appointment.

## 2019-02-20 NOTE — Telephone Encounter (Signed)
Mammogram order is placed

## 2019-03-02 ENCOUNTER — Other Ambulatory Visit: Payer: Self-pay | Admitting: Family Medicine

## 2019-03-02 ENCOUNTER — Other Ambulatory Visit: Payer: Self-pay

## 2019-03-02 ENCOUNTER — Encounter: Payer: Self-pay | Admitting: Family Medicine

## 2019-03-02 ENCOUNTER — Ambulatory Visit: Payer: Medicaid Other | Admitting: Family Medicine

## 2019-03-02 VITALS — BP 124/80 | HR 73 | Temp 98.1°F | Resp 14 | Ht 61.0 in | Wt 160.5 lb

## 2019-03-02 DIAGNOSIS — N644 Mastodynia: Secondary | ICD-10-CM | POA: Diagnosis not present

## 2019-03-02 DIAGNOSIS — Z596 Low income: Secondary | ICD-10-CM | POA: Diagnosis not present

## 2019-03-02 NOTE — Progress Notes (Signed)
Name: Kristina Reed   MRN: [id number]    DOB: [birth year]   Date:03/02/2019       Progress Note  Subjective  Chief Complaint  Chief Complaint  Patient presents with  . Breast Pain    bilateral breast pain for 2 weeks    HPI  Pt presents with concern for bilateral upper breast pain for 2-3 weeks.  She denies nipple discharge, no changes in consistency of breast tissue per her report.  She has not been able to wear a bra because she is having too much pain.  She has history hysterectomy in 1994.  She is due for mammogram - we will order diagnostic instead of screening.  She does have history of abnormal mammo many years ago - Mammogram from 2017 notes appropriate positioning of coil clip on right breast at 10:00 position and ribbon clip on left breast at 3:00 position.   Patient Active Problem List   Diagnosis Date Noted  . Family hx-breast malignancy 12/16/2018  . Esophageal dysphagia   . Essential hypertension 01/19/2018  . Obesity (BMI 30.0-34.9) 01/19/2018  . Preventative health care 09/03/2017  . Eosinophilic esophagitis 09/16/7251  . Apophysitis 08/26/2017  . 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 Pain in joint, multiple sites 07/05/2017  . Osteoarthritis of knee 07/05/2017  . Lichen sclerosus et atrophicus 04/27/2017  . Headache 04/27/2017  . Headache disorder 02/17/2017  . Post-concussion headache 02/17/2017  . Medication monitoring encounter 09/21/2016  . Hematuria 04/23/2016  . Hx of cervical cancer 01/30/2016  . Menopause 01/30/2016  . Status post bilateral breast biopsy 01/07/2016  . Chronic nausea 10/25/2015  . Screening for STD (sexually transmitted disease) 10/25/2015  . Rectal bleeding 04/30/2015  . Right knee pain 07/23/2014  . Depression, major, recurrent, moderate ([place]) 02/02/2014  . Adult hypothyroidism 02/02/2014  . Obstructive apnea 02/02/2014  . Anxiety and depression 02/02/2014  . Incomplete bladder emptying 12/12/2012  . Tenosynovitis of foot  05/30/2012  . Benign neoplasm of kidney 05/13/2012  . Urge incontinence 05/13/2012  . FOM (frequency of micturition) 05/13/2012  . Chronic pain of right hand 05/05/2012  . Tarsal tunnel syndrome 03/03/2012    Social History   Tobacco Use  . Smoking status: Never Smoker  . Smokeless tobacco: Never Used  Substance Use Topics  . Alcohol use: Yes    Comment: twice a week     Current Outpatient Medications:  .  FLUoxetine (PROZAC) 20 MG tablet, Take 20 mg by mouth daily., Disp: , Rfl:  .  levothyroxine (SYNTHROID, LEVOTHROID) 75 MCG tablet, Take 1 tablet (75 mcg total) by mouth daily., Disp: 90 tablet, Rfl: 1 .  meloxicam (MOBIC) 15 MG tablet, Take 15 mg by mouth daily. , Disp: , Rfl:  .  cephALEXin (KEFLEX) 500 MG capsule, Take 500 mg by mouth 2 (two) times a day., Disp: , Rfl:  .  ondansetron (ZOFRAN ODT) 8 MG disintegrating tablet, Take 1 tablet (8 mg total) by mouth every 8 (eight) hours as needed for nausea or vomiting. (Patient not taking: Reported on 03/02/2019), Disp: 10 tablet, Rfl: 0 .  ondansetron (ZOFRAN) 4 MG tablet, Take 1 tablet (4 mg total) by mouth daily as needed. (Patient not taking: Reported on 03/02/2019), Disp: 14 tablet, Rfl: 0 .  Saccharomyces boulardii (PROBIOTIC) 250 MG CAPS, Take 1 capsule by mouth 3 times/day as needed-between meals & bedtime. (Patient not taking: Reported on 03/02/2019), Disp: 60 capsule, Rfl: 0  Allergies  Allergen Reactions  . Percocet [Oxycodone-Acetaminophen] Palpitations  I personally reviewed active problem list, medication list, allergies, lab results with the patient/caregiver today.  ROS  Constitutional: Negative for fever or weight change.  Respiratory: Negative for cough and shortness of breath.   Cardiovascular: Negative for chest pain or palpitations.  Gastrointestinal: Negative for abdominal pain, no bowel changes.  Musculoskeletal: Negative for gait problem or joint swelling.  Skin: Negative for rash.  Neurological:  Negative for dizziness or headache.  No other specific complaints in a complete review of systems (except as listed in HPI above).   Objective  Vitals:   03/02/19 0822  BP: 124/80  Pulse: 73  Resp: 14  Temp: 98.1 F (36.7 C)  TempSrc: Oral  SpO2: 93%  Weight: 160 lb 8 oz (72.8 kg)  Height: 5\' 1"  (1.549 m)    Body mass index is 30.33 kg/m.  Nursing Note and Vital Signs reviewed.  Physical Exam  Constitutional: Patient appears well-developed and well-nourished. No distress.  HENT: Head: Normocephalic and atraumatic. Eyes: Conjunctivae and EOM are normal. Neck: Normal range of motion. Neck supple. No JVD present. No thyromegaly present.  Cardiovascular: Normal rate, regular rhythm and normal heart sounds.  No murmur heard. No BLE edema. Pulmonary/Chest: Effort normal and breath sounds normal. No respiratory distress. Abdominal: Soft. Bowel sounds are normal, no distension. There is no tenderness. no masses Breast: no nipple discharge or rashes , breasts are symmetric, the right upper outer quadrant and the left upper inner quadrant both exhibit shotty consistently and are tender to palpation. Musculoskeletal: Normal range of motion, no joint effusions. No gross deformities Neurological: he is alert and oriented to person, place, and time. No cranial nerve deficit. Coordination, balance, strength, speech and gait are normal.  Skin: Skin is warm and dry. No rash noted. No erythema.  Psychiatric: Patient has a normal mood and affect. behavior is normal. Judgment and thought content normal.  No results found for this or any previous visit (from the past 72 hour(s)).  Assessment & Plan  1. Breast pain in female - MM Digital Diagnostic Bilat; Future  -Red flags and when to present for emergency care or RTC including fever >101.28F, chest pain, shortness of breath, new/worsening/un-resolving symptoms, reviewed with patient at time of visit. Follow up and care instructions discussed  and provided in AVS.

## 2019-03-07 ENCOUNTER — Encounter: Payer: Self-pay | Admitting: Gastroenterology

## 2019-03-07 ENCOUNTER — Ambulatory Visit
Admission: RE | Admit: 2019-03-07 | Discharge: 2019-03-07 | Disposition: A | Discharge status: Home / Self Care | Payer: Medicaid Other | Source: Ambulatory Visit | Attending: Family Medicine | Admitting: Family Medicine

## 2019-03-07 ENCOUNTER — Other Ambulatory Visit: Payer: Self-pay

## 2019-03-07 ENCOUNTER — Ambulatory Visit (INDEPENDENT_AMBULATORY_CARE_PROVIDER_SITE_OTHER): Payer: Medicaid Other | Admitting: Gastroenterology

## 2019-03-07 VITALS — BP 142/86 | HR 65 | Temp 98.5°F | Resp 16 | Ht 61.0 in | Wt 160.2 lb

## 2019-03-07 DIAGNOSIS — Z596 Low income: Secondary | ICD-10-CM | POA: Diagnosis not present

## 2019-03-07 DIAGNOSIS — K640 First degree hemorrhoids: Secondary | ICD-10-CM | POA: Diagnosis not present

## 2019-03-07 DIAGNOSIS — K625 Hemorrhage of anus and rectum: Secondary | ICD-10-CM | POA: Diagnosis not present

## 2019-03-07 DIAGNOSIS — N644 Mastodynia: Secondary | ICD-10-CM

## 2019-03-07 DIAGNOSIS — R928 Other abnormal and inconclusive findings on diagnostic imaging of breast: Secondary | ICD-10-CM | POA: Diagnosis not present

## 2019-03-07 DIAGNOSIS — N6489 Other specified disorders of breast: Secondary | ICD-10-CM | POA: Diagnosis not present

## 2019-03-07 NOTE — Progress Notes (Signed)

## 2019-03-07 NOTE — Progress Notes (Signed)
Cephas Darby, MD [address]  [place]  [place], [place] [address]  Main: [phone]  Fax: [phone] Pager: [phone]   Primary Care Physician: [name], MD  Primary Gastroenterologist:  Dr. Cephas Darby  Chief Complaint  Patient presents with   Rectal Bleeding    HPI: Kristina Reed is a 53 y.o. female who is regularly sees Dr. Vicente Males for eosinophilic esophagitis is referred to me for management of hemorrhoids. Patient reports that she underwent hemorrhoid banding by Dr [name] 3 years ago underwent 3 banding sessions and she did not think the procedure was helpful in alleviating her hemorrhoid symptoms. Her symptoms are predominantly itching, burning, swelling, pressure, rectal bleeding. She has history of chronic constipation and spends about 10-20 minutes time on toilet, due to significant straining/pushing. She consumes red meat, fried foods regularly. She has not tried any stool softener or fiber supplements.  Follow-up visit 01/19/2018 She tried linaclotide 131mcg which resulted in severe diarrhea and mild rectal bleeding which resolved. She no longer experiences diarrhea. Reports having one to 2 soft bowel movements daily, but associated with pushing. She denies any symptoms from hemorrhoids at this time. She is taking fiber supplements as recommended daily.  Follow-up visit 02/11/2018 She reports that her stools are very soft and resulted in incontinence on 2 separate occasions in last 2 days. She stopped all the fried foods. She is taking fiber supplements daily. She denies abdominal pain, nausea, vomiting, abdominal cramps, blood in stools. She denies taking any recent antibiotics, sick contacts or eating out. She is here for hemorrhoid banding today  Follow-up visit 03/25/2018 She reports recurrence of dysphagia to solids. She was treated with Flovent for 8 weeks in the past for his peptic esophagitis. She is currently off PPI as well. She is  concerned about intermittent episodes of painless rectal bleeding both on wiping and dripping. She denies constipation. She reports some rectal discomfort every time she has a bowel movement. She is here to undergo hemorrhoid ligation.  Follow-up visit 03/07/2019 She made a follow-up to see me due to episode of rectal bleeding that occurred yesterday.  She reports it was bright red blood, 1-2 teaspoonful.  She reports mild rectal discomfort.  She denies constipation. She underwent hemorrhoid ligation x2 in the past Patient underwent EGD for follow-up of eosinophilic esophagitis which revealed significant improvement in inflammation and eosinophilic count.  She was on Dexilant.  Currently, not on PPI.  She denies dysphagia   Current Outpatient Medications:    levothyroxine (SYNTHROID, LEVOTHROID) 75 MCG tablet, Take 1 tablet (75 mcg total) by mouth daily., Disp: 90 tablet, Rfl: 1   meloxicam (MOBIC) 15 MG tablet, Take 15 mg by mouth daily. , Disp: , Rfl:    cephALEXin (KEFLEX) 500 MG capsule, Take 500 mg by mouth 2 (two) times a day., Disp: , Rfl:    FLUoxetine (PROZAC) 20 MG tablet, Take 20 mg by mouth daily., Disp: , Rfl:    Allergies as of 03/07/2019 - Review Complete 03/07/2019  Allergen Reaction Noted   Percocet [oxycodone-acetaminophen] Palpitations 01/20/2015    NSAIDs: none  Antiplts/Anticoagulants/Anti thrombotics: none  GI procedures:  Upper endoscopy 07/22/2017 - Normal examined duodenum. - Gastritis. Biopsied. - Salmon-colored mucosa suspicious for Barrett's esophagus. Biopsied. - Normal mucosa was found in the entire esophagus. Biopsied. DIAGNOSIS:  A. STOMACH; COLD BIOPSY:  - ANTRAL MUCOSA WITH CHANGES CONSISTENT WITH HEALING MUCOSAL INJURY.  - NEGATIVE FOR H. PYLORI, DYSPLASIA, AND MALIGNANCY.  B. ESOPHAGUS, DISTAL; COLD BIOPSY:  - GASTROESOPHAGITIS.  - UP TO 40 INTRAEPITHELIAL EOSINOPHILS IN A HIGH-POWER FIELD WITH  EOSINOPHILIC MICROABSCESS FORMATION.  -  PANCREATIC ACINAR CELL METAPLASIA.  - NEGATIVE FOR INTESTINAL METAPLASIA, DYSPLASIA, AND MALIGNANCY.   C. ESOPHAGUS; RANDOM BIOPSY:  - SQUAMOUS MUCOSA WITH AN INCREASE IN INTRAEPITHELIAL EOSINOPHILS  INVOLVING ALL BIOPSY FRAGMENTS, SEE COMMENT.  - UP TO 55 EOSINOPHILS IN A HIGH-POWER FIELD WITH EOSINOPHILIC  MICROABSCESS FORMATION.  - NEGATIVE FOR DYSPLASIA AND MALIGNANCY.   Colonoscopy on 09/2017 by Dr. Vicente Males - Non-bleeding internal hemorrhoids. - Diverticulosis in the sigmoid colon. - The examination was otherwise normal on direct and retroflexion views. - No specimens collected.  EGD 03/31/2018 - Normal duodenal bulb and second portion of the duodenum. - Normal stomach. - Esophagogastric landmarks identified. - Normal esophagus. Biopsied.  DIAGNOSIS:  A. DISTAL ESOPHAGUS; COLD BIOPSY:  - SQUAMOUS MUCOSA WITH UP TO 10 IN A HIGH-POWER FIELD INTRAEPITHELIAL  EOSINOPHILS.  - NEGATIVE FOR DYSPLASIA AND MALIGNANCY.   B. PROXIMAL ESOPHAGUS; COLD BIOPSY:  - SQUAMOUS MUCOSA NEGATIVE FOR EOSINOPHILS, DYSPLASIA AND MALIGNANCY.   ROS:  General: Negative for anorexia, weight loss, fever, chills, fatigue, weakness. ENT: Negative for hoarseness, difficulty swallowing , nasal congestion. CV: Negative for chest pain, angina, palpitations, dyspnea on exertion, peripheral edema.  Respiratory: Negative for dyspnea at rest, dyspnea on exertion, cough, sputum, wheezing.  GI: See history of present illness. GU:  Negative for dysuria, hematuria, urinary incontinence, urinary frequency, nocturnal urination.  Endo: Negative for unusual weight change.    Physical Examination:   BP (!) 142/86 (BP Location: Left Arm, Patient Position: Sitting, Cuff Size: Normal)    Pulse 65    Temp 98.5 F (36.9 C)    Resp 16    Ht 5\' 1"  (1.549 m)    Wt 160 lb 3.2 oz (72.7 kg)    LMP 05/09/1993 (Approximate)    BMI 30.27 kg/m   General: Well-nourished, well-developed in no acute distress.  Eyes: No icterus.  Conjunctivae pink. Mouth: Oropharyngeal mucosa moist and pink , no lesions erythema or exudate. Lungs: Clear to auscultation bilaterally. Non-labored. Heart: Regular rate and rhythm, no murmurs rubs or gallops.  Abdomen: Bowel sounds are normal, nontender, nondistended, no hepatosplenomegaly or masses, no hernia , no rebound or guarding.   Rectal exam: Nontender, normal perianal exam, hypopigmented perianal skin Extremities: No lower extremity edema. No clubbing or deformities. Neuro: Alert and oriented x 3.  Grossly intact. Skin: Warm and dry, no jaundice.   Psych: Alert and cooperative, normal mood and affect.   Imaging Studies: Ct Abdomen Pelvis W Contrast  Result Date: 02/08/2019 CLINICAL DATA:  Abdominal pain with diverticulitis suspected EXAM: CT ABDOMEN AND PELVIS WITH CONTRAST TECHNIQUE: Multidetector CT imaging of the abdomen and pelvis was performed using the standard protocol following bolus administration of intravenous contrast. CONTRAST:  190mL OMNIPAQUE IOHEXOL 300 MG/ML  SOLN COMPARISON:  01/24/2019 FINDINGS: Lower chest:  No contributory findings. Hepatobiliary: No focal liver abnormality.Cholecystectomy with normal common bile duct diameter Pancreas: Unremarkable. Spleen: Unremarkable. Adrenals/Urinary Tract: Negative adrenals. No hydronephrosis or stone. Small renal cystic densities. There is a C-shaped low-density posterior and right lateral to the urethra, measuring 20 x 13 mm. No internal filling defect is noted. Stomach/Bowel: Fat inflammation surrounds a thickened sigmoid diverticulum. There is regional colonic wall thickening that is new. More proximal fat inflammation has resolved since prior. No perforation or abscess is seen. No appendicitis. There is hypertrophic fat around the lower sigmoid and rectum,  question if there was radiotherapy for patient's cervical cancer. Vascular/Lymphatic: No acute vascular abnormality. No mass or adenopathy. Reproductive:Hysterectomy.  Decreased enhancement of left ovarian structure likely reflecting a corpus luteum in this patient with Peri menopausal age. Other: No ascites or pneumoperitoneum. Musculoskeletal: No acute abnormalities. Thoracic spondylosis. Lower lumbar facet spurring. IMPRESSION: 1. Sigmoid diverticulitis. There is moderate inflammation without abscess or pneumoperitoneum. 2. Sizable urethral diverticulum. Electronically Signed   By: Monte Fantasia M.D.   On: 02/08/2019 05:07   Dg Chest Portable 1 View  Result Date: 02/08/2019 CLINICAL DATA:  Weakness EXAM: PORTABLE CHEST 1 VIEW COMPARISON:  01/01/2019 FINDINGS: Normal heart size and mediastinal contours. No acute infiltrate or edema. No effusion or pneumothorax. No acute osseous findings. IMPRESSION: Negative chest. Electronically Signed   By: Monte Fantasia M.D.   On: 02/08/2019 04:18    Assessment and Plan:   Kristina Reed is a 53 y.o. female history of eosinophilic esophagitis treated with Flovent in the past, chronic constipation and intermittent rectal bleeding here for follow-up. Prior history of banding about 3 years ago with modest benefit. She now has recurrence of dysphagia  Dysphagia to solids: with history of eosinophilic Esophagitis EGD revealed improvement in eosinophilic count, responsive to PPI Currently asymptomatic - Currently not on PPI  Chronic constipation: Resolved - continue high-fiber diet - continue to avoid red meat and greasy foods  Symptomatic hemorrhoids: - I performed hemorrhoidal ligation x 2 in the past - Perform hemorrhoid ligation today   Follow up in 2 weeks    Dr Sherri Sear, MD

## 2019-03-08 ENCOUNTER — Other Ambulatory Visit: Payer: Medicaid Other | Attending: Student in an Organized Health Care Education/Training Program

## 2019-03-08 ENCOUNTER — Telehealth: Payer: Self-pay

## 2019-03-08 DIAGNOSIS — N644 Mastodynia: Secondary | ICD-10-CM

## 2019-03-08 NOTE — Telephone Encounter (Signed)
I can refer to general surgeon or Kristeen Miss can find breast specialist - maybe with [facility] or in [place]?

## 2019-03-08 NOTE — Telephone Encounter (Signed)
Pt notified, surgeon [facility]

## 2019-03-08 NOTE — Telephone Encounter (Signed)
Copied from [place] [phone]. Topic: General - Inquiry >> Mar 08, 2019  8:13 AM Richardo Priest, NT wrote: Reason for CRM: Patient calling in stating she would like to get a second opinion for her lumps in her breasts. Patient's call back is [phone].

## 2019-03-09 NOTE — Telephone Encounter (Signed)
Referral is placed, this is non-urgent.

## 2019-03-13 ENCOUNTER — Ambulatory Visit: Payer: Medicaid Other | Admitting: Student in an Organized Health Care Education/Training Program

## 2019-03-15 ENCOUNTER — Other Ambulatory Visit
Admission: RE | Admit: 2019-03-15 | Discharge: 2019-03-15 | Disposition: A | Discharge status: Home / Self Care | Payer: Medicaid Other | Source: Ambulatory Visit | Attending: Student in an Organized Health Care Education/Training Program | Admitting: Student in an Organized Health Care Education/Training Program

## 2019-03-15 ENCOUNTER — Other Ambulatory Visit: Payer: Self-pay

## 2019-03-15 DIAGNOSIS — Z01812 Encounter for preprocedural laboratory examination: Secondary | ICD-10-CM | POA: Diagnosis not present

## 2019-03-15 DIAGNOSIS — Z1159 Encounter for screening for other viral diseases: Secondary | ICD-10-CM | POA: Insufficient documentation

## 2019-03-15 LAB — SARS CORONAVIRUS 2 (TAT 6-24 HRS): SARS Coronavirus 2: NEGATIVE

## 2019-03-16 ENCOUNTER — Ambulatory Visit: Payer: Medicaid Other | Admitting: General Surgery

## 2019-03-16 ENCOUNTER — Encounter: Payer: Self-pay | Admitting: General Surgery

## 2019-03-16 ENCOUNTER — Other Ambulatory Visit: Payer: Self-pay

## 2019-03-16 VITALS — BP 120/88 | HR 79 | Temp 97.9°F | Resp 15 | Ht 61.5 in | Wt 159.0 lb

## 2019-03-16 DIAGNOSIS — N644 Mastodynia: Secondary | ICD-10-CM

## 2019-03-16 DIAGNOSIS — Z596 Low income: Secondary | ICD-10-CM | POA: Diagnosis not present

## 2019-03-16 NOTE — Progress Notes (Signed)
Patient ID: Kristina Reed, female   DOB: 11/09/1965, 53 y.o.   MRN: [id number]  Chief Complaint  Patient presents with  . New Patient (Initial Visit)    mastalgia    HPI Kristina Reed is a 53 y.o. female here for evaluation for bilateral breast pain. This has been going on for 5-6 months. She gets regular mammograms done. She does not check herself regularly. Her last mammogram and bilateral ultrasound was on 03/07/19. She reports that she has pain with wearing a bra. She has lost 10 pounds since January.   HPI  Past Medical History:  Diagnosis Date  . Anxiety   . Arthritis    right knee and right elbow  . Cancer ([place])    Cervical CA with partial hysterectomy.  . Cognitive impairment, mild, so stated   . Depression   . Diverticulosis   . Eosinophilic esophagitis 63/09/6008   Biopsy Dec 2018  . Gallstones   . GERD (gastroesophageal reflux disease)   . Hx of cervical cancer 01/30/2016  . Hypertension   . Hypothyroidism   . Sleep apnea    does not use a C-PAP  . Status post partial hysterectomy    Due to Cervical CA  . Vaginal inclusion cyst     Past Surgical History:  Procedure Laterality Date  . ABDOMINAL HYSTERECTOMY     partial hysterectomy  . BREAST BIOPSY Bilateral 01/01/2016   [name] cores. Fibroadenomas bilaterally  . COLONOSCOPY WITH PROPOFOL N/A 06/27/2015   Procedure: COLONOSCOPY WITH PROPOFOL;  Surgeon: Josefine Class, MD;  Location: [facility] ENDOSCOPY;  Service: Endoscopy;  Laterality: N/A;  . COLONOSCOPY WITH PROPOFOL N/A 09/23/2017   Procedure: COLONOSCOPY WITH PROPOFOL;  Surgeon: Jonathon Bellows, MD;  Location: [facility] ENDOSCOPY;  Service: Gastroenterology;  Laterality: N/A;  . ESOPHAGOGASTRODUODENOSCOPY (EGD) WITH PROPOFOL N/A 07/22/2017   Procedure: ESOPHAGOGASTRODUODENOSCOPY (EGD) WITH PROPOFOL;  Surgeon: Jonathon Bellows, MD;  Location: [facility] ENDOSCOPY;  Service: Gastroenterology;  Laterality: N/A;  . ESOPHAGOGASTRODUODENOSCOPY (EGD) WITH PROPOFOL N/A 03/31/2018   Procedure:  ESOPHAGOGASTRODUODENOSCOPY (EGD) WITH PROPOFOL;  Surgeon: Lin Landsman, MD;  Location: [facility] ENDOSCOPY;  Service: Gastroenterology;  Laterality: N/A;  . KNEE ARTHROSCOPY Right 07/22/2016   Procedure: ARTHROSCOPY KNEE debridement microfracture;  Surgeon: Leanor Kail, MD;  Location: [facility] ORS;  Service: Orthopedics;  Laterality: Right;  . KNEE ARTHROSCOPY WITH MEDIAL MENISECTOMY Left 11/10/2017   Procedure: KNEE ARTHROSCOPY WITH PARTIAL MEDIAL MENISECTOMY&patella femoral debridement, & ablation;  Surgeon: Leanor Kail, MD;  Location: [facility] ORS;  Service: Orthopedics;  Laterality: Left;  . TUBAL LIGATION      Family History  Problem Relation Age of Onset  . Diabetes Mother   . Breast cancer Maternal Aunt   . Breast cancer Maternal Grandmother   . Cancer Paternal Grandmother        cancer?  . Cancer Paternal Grandfather        unknown  . Cirrhosis Cousin     Social History Social History   Tobacco Use  . Smoking status: Never Smoker  . Smokeless tobacco: Never Used  Substance Use Topics  . Alcohol use: Yes    Comment: twice a week  . Drug use: No    Allergies  Allergen Reactions  . Percocet [Oxycodone-Acetaminophen] Palpitations    Current Outpatient Medications  Medication Sig Dispense Refill  . FLUoxetine (PROZAC) 20 MG tablet Take 20 mg by mouth daily.    Marland Kitchen levothyroxine (SYNTHROID, LEVOTHROID) 75 MCG tablet Take 1 tablet (75 mcg total) by mouth daily. [place]  tablet 1  . meloxicam (MOBIC) 15 MG tablet Take 15 mg by mouth daily.      No current facility-administered medications for this visit.     Review of Systems Review of Systems  Constitutional: Negative.   Respiratory: Negative.   Cardiovascular: Negative.     Blood pressure 120/88, pulse 79, temperature 97.9 F (36.6 C), resp. rate 15, height 5' 1.5" (1.562 m), weight 159 lb (72.1 kg), last menstrual period 05/09/1993, SpO2 97 %.  Physical Exam Physical Exam Exam conducted with a chaperone present.   Constitutional:      Appearance: She is well-developed.  Eyes:     General: No scleral icterus.    Conjunctiva/sclera: Conjunctivae normal.  Neck:     Musculoskeletal: Neck supple.  Cardiovascular:     Rate and Rhythm: Normal rate and regular rhythm.     Heart sounds: Normal heart sounds.  Pulmonary:     Effort: Pulmonary effort is normal.     Breath sounds: Normal breath sounds.  Chest:     Breasts:        Right: No inverted nipple, mass, nipple discharge, skin change or tenderness.        Left: Tenderness present. No inverted nipple, mass, nipple discharge or skin change.    Lymphadenopathy:     Cervical: No cervical adenopathy.     Upper Body:     Right upper body: No supraclavicular or axillary adenopathy.     Left upper body: No supraclavicular or axillary adenopathy.  Skin:    General: Skin is warm and dry.  Neurological:     Mental Status: She is alert and oriented to person, place, and time.  Psychiatric:        Mood and Affect: Mood normal.     Data Reviewed Bilateral diagnostic mammograms of March 07, 2019 were independently reviewed.  Bilateral breast ultrasounds were completed with no images recorded and no findings noted.  BI-RADS-2.  Assessment Mastalgia without underlying gross fibrocystic disease.  Plan  The patient has been asked to make use of Protegra or Ocuvite vitamins twice daily to see if this will resolve the discomfort she is experiencing in the chest.  The 1 bottle of diet [facility] that she drinks daily, unchanged from years past, will likely continue.   HPI, Physical Exam, Assessment and Plan have been scribed under the direction and in the presence of Robert Bellow, MD  Concepcion Living, LPN  I have completed the exam and reviewed the above documentation for accuracy and completeness.  I agree with the above.  Haematologist has been used and any errors in dictation or transcription are unintentional.  Hervey Ard,  M.D., F.A.C.S.  Forest Gleason Ashaya Raftery 03/17/2019, 12:44 PM

## 2019-03-16 NOTE — Patient Instructions (Signed)
Try taking supplements  Protegra and Ocuvite, 1 tablet of each two times a day.  Will take up to 6 weeks to start working.   Follow up here in 2 months

## 2019-03-17 ENCOUNTER — Encounter: Payer: Self-pay | Admitting: General Surgery

## 2019-03-17 ENCOUNTER — Other Ambulatory Visit: Payer: Self-pay | Admitting: Family Medicine

## 2019-03-17 DIAGNOSIS — N644 Mastodynia: Secondary | ICD-10-CM | POA: Insufficient documentation

## 2019-03-17 DIAGNOSIS — R079 Chest pain, unspecified: Secondary | ICD-10-CM | POA: Insufficient documentation

## 2019-03-20 ENCOUNTER — Other Ambulatory Visit: Payer: Self-pay

## 2019-03-20 ENCOUNTER — Encounter: Payer: Self-pay | Admitting: Student in an Organized Health Care Education/Training Program

## 2019-03-20 ENCOUNTER — Ambulatory Visit
Payer: Medicaid Other | Attending: Student in an Organized Health Care Education/Training Program | Admitting: Student in an Organized Health Care Education/Training Program

## 2019-03-20 VITALS — BP 139/95 | HR 66 | Temp 98.3°F | Resp 15 | Ht 62.0 in | Wt 158.0 lb

## 2019-03-20 DIAGNOSIS — M170 Bilateral primary osteoarthritis of knee: Secondary | ICD-10-CM | POA: Insufficient documentation

## 2019-03-20 DIAGNOSIS — Z596 Low income: Secondary | ICD-10-CM | POA: Diagnosis not present

## 2019-03-20 MED ORDER — LIDOCAINE HCL 2 % IJ SOLN
20.0000 mL | Freq: Once | INTRAMUSCULAR | Status: AC
  Administered 2019-03-20: 400 mg

## 2019-03-20 MED ORDER — SODIUM HYALURONATE (VISCOSUP) 20 MG/2ML IX SOSY
2.0000 mL | PREFILLED_SYRINGE | Freq: Once | INTRA_ARTICULAR | Status: AC
  Administered 2019-03-20: 13:00:00 via INTRA_ARTICULAR

## 2019-03-20 MED ORDER — FLUOXETINE HCL 20 MG PO TABS: 20.0000 mg | ORAL_TABLET | Freq: Every day | ORAL | 1 refills | Status: DC

## 2019-03-20 MED ORDER — LIDOCAINE HCL 2 % IJ SOLN
INTRAMUSCULAR | Status: AC
  Filled 2019-03-20: qty 20

## 2019-03-20 NOTE — Progress Notes (Signed)
Patient's Name: Kristina Reed  MRN: [id number]  Referring Provider: [name], MD  DOB: Feb 01, 1966  PCP: Hubbard Hartshorn, FNP  DOS: 03/20/2019  Note by: Gillis Santa, MD  Service setting: Ambulatory outpatient  Specialty: Interventional Pain Management  Patient type: Established  Location: [facility] (AMB) Pain Management Facility  Visit type: Interventional Procedure   Primary Reason for Visit: Interventional Pain Management Treatment. CC: Knee Pain  Procedure:          Anesthesia, Analgesia, Anxiolysis:  Type: Therapeutic Intra-Articular Hyalgan Knee Injection #1  Region: Lateral infrapatellar Knee Region Level: Knee Joint Laterality: Bilateral  Type: Local Anesthesia Indication(s): Analgesia         Local Anesthetic: Lidocaine 1-2% Route: Infiltration (Morgan/IM) IV Access: Declined Sedation: Declined   Position: Sitting   Indications: 1. Bilateral primary osteoarthritis of knee    Pain Score: Pre-procedure: 10-Worst pain ever/10 Post-procedure: 4/10  Pre-op Assessment:  Kristina Reed is a 53 y.o. (year old), female patient, seen today for interventional treatment. She  has a past surgical history that includes Abdominal hysterectomy; Colonoscopy with propofol (N/A, 06/27/2015); Tubal ligation; Knee arthroscopy (Right, 07/22/2016); Esophagogastroduodenoscopy (egd) with propofol (N/A, 07/22/2017); Colonoscopy with propofol (N/A, 09/23/2017); Knee arthroscopy with medial menisectomy (Left, 11/10/2017); Esophagogastroduodenoscopy (egd) with propofol (N/A, 03/31/2018); and Breast biopsy (Bilateral, 01/01/2016). Kristina Reed has a current medication list which includes the following prescription(s): fluoxetine, levothyroxine, and meloxicam. Her primarily concern today is the Knee Pain  Initial Vital Signs:  Pulse/HCG Rate: 66ECG Heart Rate: 70 Temp: 98.3 F (36.8 C) Resp: 15 BP: (!) 145/105 SpO2: 100 %  BMI: Estimated body mass index is 28.9 kg/m as calculated from the following:   Height as of this  encounter: 5\' 2"  (1.575 m).   Weight as of this encounter: 158 lb (71.7 kg).  Risk Assessment: Allergies: Reviewed. She is allergic to percocet [oxycodone-acetaminophen].  Allergy Precautions: None required Coagulopathies: Reviewed. None identified.  Blood-thinner therapy: None at this time Active Infection(s): Reviewed. None identified. Kristina Reed is afebrile  Site Confirmation: Kristina Reed was asked to confirm the procedure and laterality before marking the site Procedure checklist: Completed Consent: Before the procedure and under the influence of no sedative(s), amnesic(s), or anxiolytics, the patient was informed of the treatment options, risks and possible complications. To fulfill our ethical and legal obligations, as recommended by the American Medical Association's Code of Ethics, I have informed the patient of my clinical impression; the nature and purpose of the treatment or procedure; the risks, benefits, and possible complications of the intervention; the alternatives, including doing nothing; the risk(s) and benefit(s) of the alternative treatment(s) or procedure(s); and the risk(s) and benefit(s) of doing nothing. The patient was provided information about the general risks and possible complications associated with the procedure. These may include, but are not limited to: failure to achieve desired goals, infection, bleeding, organ or nerve damage, allergic reactions, paralysis, and death. In addition, the patient was informed of those risks and complications associated to the procedure, such as failure to decrease pain; infection; bleeding; organ or nerve damage with subsequent damage to sensory, motor, and/or autonomic systems, resulting in permanent pain, numbness, and/or weakness of one or several areas of the body; allergic reactions; (i.e.: anaphylactic reaction); and/or death. Furthermore, the patient was informed of those risks and complications associated with the medications.  These include, but are not limited to: allergic reactions (i.e.: anaphylactic or anaphylactoid reaction(s)); adrenal axis suppression; blood sugar elevation that in diabetics may result in ketoacidosis or  comma; water retention that in patients with history of congestive heart failure may result in shortness of breath, pulmonary edema, and decompensation with resultant heart failure; weight gain; swelling or edema; medication-induced neural toxicity; particulate matter embolism and blood vessel occlusion with resultant organ, and/or nervous system infarction; and/or aseptic necrosis of one or more joints. Finally, the patient was informed that Medicine is not an exact science; therefore, there is also the possibility of unforeseen or unpredictable risks and/or possible complications that may result in a catastrophic outcome. The patient indicated having understood very clearly. We have given the patient no guarantees and we have made no promises. Enough time was given to the patient to ask questions, all of which were answered to the patient's satisfaction. [name] has indicated that she wanted to continue with the procedure. Attestation: I, the ordering provider, attest that I have discussed with the patient the benefits, risks, side-effects, alternatives, likelihood of achieving goals, and potential problems during recovery for the procedure that I have provided informed consent. Date  Time: 03/20/2019 12:39 PM  Pre-Procedure Preparation:  Monitoring: As per clinic protocol. Respiration, ETCO2, SpO2, BP, heart rate and rhythm monitor placed and checked for adequate function Safety Precautions: Patient was assessed for positional comfort and pressure points before starting the procedure. Time-out: I initiated and conducted the "Time-out" before starting the procedure, as per protocol. The patient was asked to participate by confirming the accuracy of the "Time Out" information. Verification of the correct  person, site, and procedure were performed and confirmed by me, the nursing staff, and the patient. "Time-out" conducted as per Joint Commission's Universal Protocol (UP.01.01.01). Time: 1314  Description of Procedure:          Target Area: Knee Joint Approach: Just above the Lateral tibial plateau, lateral to the infrapatellar tendon. Area Prepped: Entire knee area, from the mid-thigh to the mid-shin. Prepping solution: DuraPrep (Iodine Povacrylex [0.7% available iodine] and Isopropyl Alcohol, 74% w/w) Safety Precautions: Aspiration looking for blood return was conducted prior to all injections. At no point did we inject any substances, as a needle was being advanced. No attempts were made at seeking any paresthesias. Safe injection practices and needle disposal techniques used. Medications properly checked for expiration dates. SDV (single dose vial) medications used. Description of the Procedure: Protocol guidelines were followed. The patient was placed in position over the fluoroscopy table. The target area was identified and the area prepped in the usual manner. Skin & deeper tissues infiltrated with local anesthetic. Appropriate amount of time allowed to pass for local anesthetics to take effect. The procedure needles were then advanced to the target area. Proper needle placement secured. Negative aspiration confirmed. Solution injected in intermittent fashion, asking for systemic symptoms every 0.5cc of injectate. The needles were then removed and the area cleansed, making sure to leave some of the prepping solution back to take advantage of its long term bactericidal properties. Vitals:   03/20/19 1248 03/20/19 1319  BP: (!) 145/105 (!) 139/95  Pulse: 66   Resp:  15  Temp: 98.3 F (36.8 C)   SpO2: 100% 100%  Weight: 158 lb (71.7 kg)   Height: 5\' 2"  (1.575 m)     Start Time: 1314 hrs. End Time: 1318 hrs. Materials:  Needle(s) Type: Regular needle Gauge: 25G Length:  1.5-in Medication(s): Please see orders for medications and dosing details.   Post-operative Assessment:  Post-procedure Vital Signs:  Pulse/HCG Rate: 6670 Temp: 98.3 F (36.8 C) Resp: 15 BP: (!) 139/95 SpO2:  100 %  EBL: None  Complications: No immediate post-treatment complications observed by team, or reported by patient.  Note: The patient tolerated the entire procedure well. A repeat set of vitals were taken after the procedure and the patient was kept under observation following institutional policy, for this type of procedure. Post-procedural neurological assessment was performed, showing return to baseline, prior to discharge. The patient was provided with post-procedure discharge instructions, including a section on how to identify potential problems. Should any problems arise concerning this procedure, the patient was given instructions to immediately contact us, at any time, without hesitation. In any case, we plan to contact the patient by telephone for a follow-up status report regarding this interventional procedure.  Comments:  No additional relevant information.  Plan of Care  Orders:  Orders Placed This Encounter  Procedures  . KNEE INJECTION    Hyalgan knee injection. Please order Hyalgan.    Standing Status:   Future    Standing Expiration Date:   04/20/2019    Scheduling Instructions:     Procedure: Intra-articular Hyalgan Knee injection            Side: Bilateral     Sedation: None     Timeframe: in two (4) weeks    Order Specific Question:   Where will this procedure be performed?    Answer:   [facility]   Medications ordered for procedure: Meds ordered this encounter  Medications  . lidocaine (XYLOCAINE) 2 % (with pres) injection 400 mg  . Sodium Hyaluronate SOSY 2 mL  . Sodium Hyaluronate SOSY 2 mL   Medications administered: We administered lidocaine, Sodium Hyaluronate, and Sodium Hyaluronate.  See the medical record for exact dosing,  route, and time of administration.  Follow-up plan:   Return in about 4 weeks (around 04/17/2019) for Procedure B/L Knee hyalgan #2.     Recent Visits No visits were found meeting these conditions.  Showing recent visits within past 90 days and meeting all other requirements   Today's Visits Date Type Provider Dept  03/20/19 Procedure visit Gillis Santa, MD [facility]  Showing today's visits and meeting all other requirements   Future Appointments Date Type Provider Dept  04/17/19 Appointment Gillis Santa, MD [facility]  Showing future appointments within next 90 days and meeting all other requirements    Disposition: Discharge home  Discharge Date & Time: 03/20/2019; 1321 hrs.   Primary Care Physician: Hubbard Hartshorn, FNP Location: [facility] Note by: Gillis Santa, MD Date: 03/20/2019; Time: 3:05 PM  Disclaimer:  Medicine is not an exact science. The only guarantee in medicine is that nothing is guaranteed. It is important to note that the decision to proceed with this intervention was based on the information collected from the patient. The Data and conclusions were drawn from the patient's questionnaire, the interview, and the physical examination. Because the information was provided in large part by the patient, it cannot be guaranteed that it has not been purposely or unconsciously manipulated. Every effort has been made to obtain as much relevant data as possible for this evaluation. It is important to note that the conclusions that lead to this procedure are derived in large part from the available data. Always take into account that the treatment will also be dependent on availability of resources and existing treatment guidelines, considered by other Pain Management Practitioners as being common knowledge and practice, at the time of the intervention. For Medico-Legal purposes, it  is also important to point out that variation in  procedural techniques and pharmacological choices are the acceptable norm. The indications, contraindications, technique, and results of the above procedure should only be interpreted and judged by a Board-Certified Interventional Pain Specialist with extensive familiarity and expertise in the same exact procedure and technique.

## 2019-03-21 ENCOUNTER — Telehealth: Payer: Self-pay

## 2019-03-21 NOTE — Telephone Encounter (Signed)
Post procedure phone call.  Patient states she did not get any relief since procedure.  Informed patient to give it some time and to call us back for any other questions or concerns.

## 2019-03-24 ENCOUNTER — Ambulatory Visit: Payer: Medicaid Other | Admitting: Gastroenterology

## 2019-03-27 ENCOUNTER — Other Ambulatory Visit: Payer: Self-pay | Admitting: Student in an Organized Health Care Education/Training Program

## 2019-03-27 ENCOUNTER — Telehealth: Payer: Self-pay | Admitting: Student in an Organized Health Care Education/Training Program

## 2019-03-27 MED ORDER — DICLOFENAC SODIUM 75 MG PO TBEC: 75.0000 mg | DELAYED_RELEASE_TABLET | Freq: Two times a day (BID) | ORAL | 1 refills | Status: AC

## 2019-03-27 NOTE — Progress Notes (Signed)
Patient called to inform her that her knee is in pain.  She is status post bilateral intra-articular knee Hyalgan injection last week.  She states that her knee is also swollen.  Denies any systemic fever.  Maintains range of motion.  Recommend ice and elevation of the knee.  Also have patient discontinue Mobic and start diclofenac 75 mg twice daily to help out with her knee pain.  Do not recommend any further intra-articular Hyalgan injections.

## 2019-03-27 NOTE — Telephone Encounter (Signed)
Patient Kristina Reed stating she is having problem with extremely swollen knee since procedure done on 05-21-19. Wants to speak with someone.

## 2019-03-27 NOTE — Telephone Encounter (Signed)
Please discontinue future procedure per Dr Holley Raring.

## 2019-03-27 NOTE — Telephone Encounter (Signed)
Spoke with patient.  States she is upset with the doctor.  States she has swelling in her left knee.  Denies redness but states there is heat in it.  Denies fever.  States pain is not better.  States she did not get relief during the first 4-6 hours either.  Spoke with Dr Holley Raring.  Called patient and informed her that Dr Holley Raring wanted her to stop taking the meloxicam and start on Diclofenac 75 mg bid.  Patient states she did not have any money right now.  Instructed patient to ice and elevate left knee.  Informed patient that we would not be doing a procedure at next visit.

## 2019-04-04 ENCOUNTER — Telehealth: Payer: Self-pay | Admitting: Licensed Clinical Social Worker

## 2019-04-04 NOTE — Telephone Encounter (Signed)
Spoke with Kristina Reed about her upcoming genetics appointment on 7/30. She is unable to do a virtual visit so this appointment will be a walk-in.

## 2019-04-12 ENCOUNTER — Other Ambulatory Visit: Payer: Self-pay

## 2019-04-13 ENCOUNTER — Other Ambulatory Visit: Payer: Self-pay

## 2019-04-13 ENCOUNTER — Encounter: Payer: Self-pay | Admitting: Licensed Clinical Social Worker

## 2019-04-13 ENCOUNTER — Inpatient Hospital Stay: Payer: Medicaid Other

## 2019-04-13 ENCOUNTER — Inpatient Hospital Stay: Payer: Medicaid Other | Attending: Oncology | Admitting: Licensed Clinical Social Worker

## 2019-04-13 DIAGNOSIS — Z803 Family history of malignant neoplasm of breast: Secondary | ICD-10-CM | POA: Diagnosis not present

## 2019-04-13 DIAGNOSIS — Z8541 Personal history of malignant neoplasm of cervix uteri: Secondary | ICD-10-CM | POA: Diagnosis not present

## 2019-04-13 DIAGNOSIS — Z596 Low income: Secondary | ICD-10-CM | POA: Diagnosis not present

## 2019-04-13 NOTE — Progress Notes (Signed)
REFERRING PROVIDER: [name], MD [address] [place],  [place] [address]  PRIMARY PROVIDER:  Hubbard Hartshorn, FNP  PRIMARY REASON FOR VISIT:  1. Family history of breast cancer   2. History of cervical cancer      HISTORY OF PRESENT ILLNESS:   Kristina Reed, a 53 y.o. female, was seen for a [facility] cancer genetics consultation at the request of Dr. Sanda Klein due to a family history of breast cancer.  Kristina Reed presents to clinic today to discuss the possibility of a hereditary predisposition to cancer, genetic testing, and to further clarify her future cancer risks, as well as potential cancer risks for family members.    Kristina Reed reports she was diagnosed with cervical cancer in her 35s and had a partial hysterectomy.  CANCER HISTORY:  Oncology History   No history exists.     RISK FACTORS:  Menarche was at age 5.  First live birth at age 61.  OCP use for approximately >5 years.  Ovaries intact: Yes Hysterectomy: Yes Menopausal status: Postmenopausal HRT use: 0 years. Colonoscopy: yes; "many years ago" Mammogram within the last year: Yes   Past Medical History:  Diagnosis Date  . Anxiety   . Arthritis    right knee and right elbow  . Cancer ([place])    Cervical CA with partial hysterectomy.  . Cognitive impairment, mild, so stated   . Depression   . Diverticulosis   . Eosinophilic esophagitis 23/55/7322   Biopsy Dec 2018  . Family history of breast cancer   . Gallstones   . GERD (gastroesophageal reflux disease)   . History of cervical cancer   . Hx of cervical cancer 01/30/2016  . Hypertension   . Hypothyroidism   . Sleep apnea    does not use a C-PAP  . Status post partial hysterectomy    Due to Cervical CA  . Vaginal inclusion cyst     Past Surgical History:  Procedure Laterality Date  . ABDOMINAL HYSTERECTOMY     partial hysterectomy  . BREAST BIOPSY Bilateral 01/01/2016   Abnormal screening mammogram, ultrasound-guided core biopsy.  Fibroadenomas   . COLONOSCOPY WITH PROPOFOL N/A 06/27/2015   Procedure: COLONOSCOPY WITH PROPOFOL;  Surgeon: Josefine Class, MD;  Location: [facility] ENDOSCOPY;  Service: Endoscopy;  Laterality: N/A;  . COLONOSCOPY WITH PROPOFOL N/A 09/23/2017   Procedure: COLONOSCOPY WITH PROPOFOL;  Surgeon: Jonathon Bellows, MD;  Location: [facility] ENDOSCOPY;  Service: Gastroenterology;  Laterality: N/A;  . ESOPHAGOGASTRODUODENOSCOPY (EGD) WITH PROPOFOL N/A 07/22/2017   Procedure: ESOPHAGOGASTRODUODENOSCOPY (EGD) WITH PROPOFOL;  Surgeon: Jonathon Bellows, MD;  Location[facility] ENDOSCOPY;  Service: Gastroenterology;  Laterality: N/A;  . ESOPHAGOGASTRODUODENOSCOPY (EGD) WITH PROPOFOL N/A 03/31/2018   Procedure: ESOPHAGOGASTRODUODENOSCOPY (EGD) WITH PROPOFOL;  Surgeon: Lin Landsman, MD;  Location: [facility] ENDOSCOPY;  Service: Gastroenterology;  Laterality: N/A;  . KNEE ARTHROSCOPY Right 07/22/2016   Procedure: ARTHROSCOPY KNEE debridement microfracture;  Surgeon: Leanor Kail, MD;  Location: [facility] ORS;  Service: Orthopedics;  Laterality: Right;  . KNEE ARTHROSCOPY WITH MEDIAL MENISECTOMY Left 11/10/2017   Procedure: KNEE ARTHROSCOPY WITH PARTIAL MEDIAL MENISECTOMY&patella femoral debridement, & ablation;  Surgeon: Leanor Kail, MD;  Location: [facility] ORS;  Service: Orthopedics;  Laterality: Left;  . TUBAL LIGATION      Social History   Socioeconomic History  . Marital status: Legally Separated    Spouse name: Jeneen Rinks  . Number of children: 2  . Years of education: Not on file  . Highest education level: Not on  file  Occupational History  . Occupation: un employed  Social Needs  . Financial resource strain: Somewhat hard  . Food insecurity    Worry: Never true    Inability: Never true  . Transportation needs    Medical: Yes    Non-medical: Yes  Tobacco Use  . Smoking status: Never Smoker  . Smokeless tobacco: Never Used  Substance and Sexual Activity  . Alcohol use: Yes    Comment: twice a week  . Drug use: No   . Sexual activity: Not Currently    Birth control/protection: Surgical  Lifestyle  . Physical activity    Days per week: 0 days    Minutes per session: 0 min  . Stress: Not at all  Relationships  . Social Herbalist on phone: Once a week    Gets together: Once a week    Attends religious service: Never    Active member of club or organization: No    Attends meetings of clubs or organizations: Never    Relationship status: Separated  Other Topics Concern  . Not on file  Social History Narrative  . Not on file     FAMILY HISTORY:  We obtained a detailed, 4-generation family history.  Significant diagnoses are listed below: Family History  Problem Relation Age of Onset  . Diabetes Mother   . Breast cancer Maternal Grandmother   . Cancer Paternal Grandmother        cancer?  . Cirrhosis Cousin   . Breast cancer Other        x4    Kristina Reed has 2 daughters, ages 79 and 54. No cancers. She does not have siblings.  Kristina Reed mother is living at 41, no history of cancer. Patient has 1 maternal uncle, no history of cancer. She has 1 female maternal cousin, no cancer she is aware of. Her maternal grandmother had breast cancer and died in her 66s. This grandmother had 4 sisters, patient's maternal great aunts, that had breast cancer. Maternal grandfather died in a motorcycle accident.  Kristina Reed has limited information about her biological father's side of the family. She reports being raised by her stepfather. Her biological father died at 70. She is unsure how many aunts and uncles she has. She believes her paternal grandmother had cancer but is unsure of the type.   Kristina Reed is unaware of previous family history of genetic testing for hereditary cancer risks.There is no reported Ashkenazi Jewish ancestry. There is no known consanguinity.  GENETIC COUNSELING ASSESSMENT: Kristina Reed is a 53 y.o. female with a family history of cancer which is somewhat suggestive of a hereditary  cancer syndrome and predisposition to cancer. We, therefore, discussed and recommended the following at today's visit.   DISCUSSION: We discussed that 5-10% of breast cancer is hereditary, with most cases associated with BRCA1/BRCA2 mutations.  There are other genes that can be associated with hereditary breast  cancer syndromes.  These include PALB2, CHEK2, ATM.  We discussed that testing is beneficial for several reasons including knowing how to follow individuals, and understanding  if other family members could be at risk for cancer and allow them to undergo genetic testing.   We reviewed the characteristics, features and inheritance patterns of hereditary cancer syndromes. We also discussed genetic testing, including the appropriate family members to test, the process of testing, insurance coverage and turn-around-time for results. We discussed the implications of a negative, positive and/or variant of uncertain significant result.  We recommended Kristina Reed pursue genetic testing for the Common Hereditary Cancers gene panel.   The Common Hereditary Cancers Panel offered by [facility] includes sequencing and/or deletion duplication testing of the following 48 genes: APC, ATM, AXIN2, BARD1, BMPR1A, BRCA1, BRCA2, BRIP1, CDH1, CDKN2A (p14ARF), CDKN2A (p16INK4a), CKD4, CHEK2, CTNNA1, DICER1, EPCAM (Deletion/duplication testing only), GREM1 (promoter region deletion/duplication testing only), KIT, MEN1, MLH1, MSH2, MSH3, MSH6, MUTYH, NBN, NF1, NHTL1, PALB2, PDGFRA, PMS2, POLD1, POLE, PTEN, RAD50, RAD51C, RAD51D, RNF43, SDHB, SDHC, SDHD, SMAD4, SMARCA4. STK11, TP53, TSC1, TSC2, and VHL.  The following genes were evaluated for sequence changes only: SDHA and HOXB13 c.251G>A variant only.  Based on Ms. Badia's family  history of cancer, she meets medical criteria for genetic testing. Despite that she meets criteria, she may still have an out of pocket cost.   PLAN: After considering the risks, benefits, and  limitations, Kristina Reed provided informed consent to pursue genetic testing and the blood sample was sent to [facility] for analysis of the Common Hereditary Cancers Panel. Results should be available within approximately 2-3 weeks' time, at which point they will be disclosed by telephone to Kristina Reed, as will any additional recommendations warranted by these results. Kristina Reed will receive a summary of her genetic counseling visit and a copy of her results once available. This information will also be available in Epic.   Based on Kristina Reed's family history, we recommended her mother and maternal relatives have genetic counseling and testing.  Kristina Reed will let us know if we can be of any assistance in coordinating genetic counseling and/or testing for these family members.  Lastly, we encouraged Kristina Reed to remain in contact with cancer genetics annually so that we can continuously update the family history and inform her of any changes in cancer genetics and testing that may be of benefit for this family.   Kristina Reed questions were answered to her satisfaction today. Our contact information was provided should additional questions or concerns arise. Thank you for the referral and allowing [name] to share in the care of your patient.   Faith Rogue, MS, [facility] Counselor [email or web address] Phone: [phone]  The patient was seen for a total of 30 minutes in face-to-face genetic counseling.  Dr. Grayland Ormond was available for discussion regarding this case.   _______________________________________________________________________ For Office Staff:  Number of people involved in session: 1 Was an Intern/ student involved with case: no

## 2019-04-17 ENCOUNTER — Ambulatory Visit: Payer: Medicaid Other | Admitting: Student in an Organized Health Care Education/Training Program

## 2019-04-20 ENCOUNTER — Encounter: Payer: Self-pay | Admitting: Licensed Clinical Social Worker

## 2019-04-20 ENCOUNTER — Telehealth: Payer: Self-pay | Admitting: Licensed Clinical Social Worker

## 2019-04-20 ENCOUNTER — Ambulatory Visit: Payer: Self-pay | Admitting: Licensed Clinical Social Worker

## 2019-04-20 DIAGNOSIS — Z8541 Personal history of malignant neoplasm of cervix uteri: Secondary | ICD-10-CM

## 2019-04-20 DIAGNOSIS — Z1379 Encounter for other screening for genetic and chromosomal anomalies: Secondary | ICD-10-CM

## 2019-04-20 DIAGNOSIS — Z803 Family history of malignant neoplasm of breast: Secondary | ICD-10-CM

## 2019-04-20 NOTE — Telephone Encounter (Signed)
Revealed negative genetic testing.  This normal result is reassuring.  It is unlikely that there is an increased risk of cancer due to a mutation in one of these genes.  However, genetic testing is not perfect, and cannot definitively rule out a hereditary cause.  It will be important for her to keep in contact with genetics to learn if any additional testing may be needed in the future.      

## 2019-04-20 NOTE — Progress Notes (Signed)
HPI:  Kristina Reed was previously seen in the [place] clinic due to a family history of breast cancer and concerns regarding a hereditary predisposition to cancer. Please refer to our prior cancer genetics clinic note for more information regarding our discussion, assessment and recommendations, at the time. Kristina Reed recent genetic test results were disclosed to her, as were recommendations warranted by these results. These results and recommendations are discussed in more detail below.  CANCER HISTORY:  Oncology History   No history exists.    FAMILY HISTORY:  We obtained a detailed, 4-generation family history.  Significant diagnoses are listed below: Family History  Problem Relation Age of Onset  . Diabetes Mother   . Breast cancer Maternal Grandmother   . Cancer Paternal Grandmother        cancer?  . Cirrhosis Cousin   . Breast cancer Other        x4    Kristina Reed has 2 daughters, ages 51 and 55. No cancers. She does not have siblings.  Kristina Reed mother is living at 68, no history of cancer. Patient has 1 maternal uncle, no history of cancer. She has 1 female maternal cousin, no cancer she is aware of. Her maternal grandmother had breast cancer and died in her 75s. This grandmother had 4 sisters, patient's maternal great aunts, that had breast cancer. Maternal grandfather died in a motorcycle accident.  Kristina Reed has limited information about her biological father's side of the family. She reports being raised by her stepfather. Her biological father died at 68. She is unsure how many aunts and uncles she has. She believes her paternal grandmother had cancer but is unsure of the type.   Kristina Reed is unaware of previous family history of genetic testing for hereditary cancer risks.There is no reported Ashkenazi Jewish ancestry. There is no known consanguinity.  GENETIC TEST RESULTS: Genetic testing reported out on 04/20/2019 through the [facility] Common Hereditary cancer  panel found no pathogenic mutations. The Common Hereditary Cancers Panel offered by [facility] includes sequencing and/or deletion duplication testing of the following 48 genes: APC, ATM, AXIN2, BARD1, BMPR1A, BRCA1, BRCA2, BRIP1, CDH1, CDKN2A (p14ARF), CDKN2A (p16INK4a), CKD4, CHEK2, CTNNA1, DICER1, EPCAM (Deletion/duplication testing only), GREM1 (promoter region deletion/duplication testing only), KIT, MEN1, MLH1, MSH2, MSH3, MSH6, MUTYH, NBN, NF1, NHTL1, PALB2, PDGFRA, PMS2, POLD1, POLE, PTEN, RAD50, RAD51C, RAD51D, RNF43, SDHB, SDHC, SDHD, SMAD4, SMARCA4. STK11, TP53, TSC1, TSC2, and VHL.  The following genes were evaluated for sequence changes only: SDHA and HOXB13 c.251G>A variant only. The test report has been scanned into EPIC and is located under the Molecular Pathology section of the Results Review tab.  A portion of the result report is included below for reference.     We discussed with Kristina Reed that because current genetic testing is not perfect, it is possible there may be a gene mutation in one of these genes that current testing cannot detect, but that chance is small.  We also discussed, that there could be another gene that has not yet been discovered, or that we have not yet tested, that is responsible for the cancer diagnoses in the family. It is also possible there is a hereditary cause for the cancer in the family that Kristina Reed did not inherit and therefore was not identified in her testing.  Therefore, it is important to remain in touch with cancer genetics in the future so that we can continue to offer Kristina Reed the most up to date  genetic testing.   ADDITIONAL GENETIC TESTING: We discussed with [name] that her genetic testing was fairly extensive.  If there are genes identified to increase cancer risk that can be analyzed in the future, we would be happy to discuss and coordinate this testing at that time.    CANCER SCREENING RECOMMENDATIONS: Kristina Reed's test result is considered  negative (normal).  This means that we have not identified a hereditary cause for her family history of cancer at this time.   While reassuring, this does not definitively rule out a hereditary predisposition to cancer. It is still possible that there could be genetic mutations that are undetectable by current technology. There could be genetic mutations in genes that have not been tested or identified to increase cancer risk.  Therefore, it is recommended she continue to follow the cancer management and screening guidelines provided by her  primary healthcare provider.   An individual's cancer risk and medical management are not determined by genetic test results alone. Overall cancer risk assessment incorporates additional factors, including personal medical history, family history, and any available genetic information that may result in a personalized plan for cancer prevention and surveillance.  RECOMMENDATIONS FOR FAMILY MEMBERS:  Relatives in this family might be at some increased risk of developing cancer, over the general population risk, simply due to the family history of cancer.  We recommended female relatives in this family have a yearly mammogram beginning at age 63, or 34 years younger than the earliest onset of cancer, an annual clinical breast exam, and perform monthly breast self-exams. Female relatives in this family should also have a gynecological exam as recommended by their primary provider. All family members should have a colonoscopy by age 25, or as directed by their physicians.  It is also possible there is a hereditary cause for the cancer in Kristina Reed's family that she did not inherit and therefore was not identified in her.  Based on Kristina Reed's family history, we recommended her mother and maternal relative have genetic counseling and testing. Kristina Reed will let us know if we can be of any assistance in coordinating genetic counseling and/or testing for these family members.   FOLLOW-UP: Lastly, we discussed with [name] that cancer genetics is a rapidly advancing field and it is possible that new genetic tests will be appropriate for her and/or her family members in the future. We encouraged her to remain in contact with cancer genetics on an annual basis so we can update her personal and family histories and let her know of advances in cancer genetics that may benefit this family.   Our contact number was provided. Ms. Thole questions were answered to her satisfaction, and she knows she is welcome to call us at anytime with additional questions or concerns.   Faith Rogue, MS, [facility] Genetic Counselor [email or web address] Phone: [phone]

## 2019-04-25 ENCOUNTER — Other Ambulatory Visit: Payer: Self-pay

## 2019-04-25 ENCOUNTER — Encounter: Payer: Self-pay | Admitting: Gastroenterology

## 2019-04-25 ENCOUNTER — Ambulatory Visit: Payer: Medicaid Other | Admitting: Gastroenterology

## 2019-04-25 VITALS — BP 138/87 | HR 66 | Temp 97.9°F | Resp 16 | Ht 61.5 in | Wt 160.8 lb

## 2019-04-25 DIAGNOSIS — Z596 Low income: Secondary | ICD-10-CM | POA: Diagnosis not present

## 2019-04-25 DIAGNOSIS — K640 First degree hemorrhoids: Secondary | ICD-10-CM

## 2019-04-25 NOTE — Progress Notes (Signed)
Cephas Darby, MD [address]  [place]  [place], [place] [address]  Main: [phone]  Fax: [phone] Pager: [phone]   Primary Care Physician: Hubbard Hartshorn, FNP  Primary Gastroenterologist:  Dr. Cephas Darby  Chief Complaint  Patient presents with   Follow-up    Banding #2    HPI: Kristina Reed is a 53 y.o. female who is regularly sees Dr. Vicente Males for eosinophilic esophagitis is referred to me for management of hemorrhoids. Patient reports that she underwent hemorrhoid banding by Dr [name] 3 years ago underwent 3 banding sessions and she did not think the procedure was helpful in alleviating her hemorrhoid symptoms. Her symptoms are predominantly itching, burning, swelling, pressure, rectal bleeding. She has history of chronic constipation and spends about 10-20 minutes time on toilet, due to significant straining/pushing. She consumes red meat, fried foods regularly. She has not tried any stool softener or fiber supplements.  Follow-up visit 01/19/2018 She tried linaclotide 157mcg which resulted in severe diarrhea and mild rectal bleeding which resolved. She no longer experiences diarrhea. Reports having one to 2 soft bowel movements daily, but associated with pushing. She denies any symptoms from hemorrhoids at this time. She is taking fiber supplements as recommended daily.  Follow-up visit 02/11/2018 She reports that her stools are very soft and resulted in incontinence on 2 separate occasions in last 2 days. She stopped all the fried foods. She is taking fiber supplements daily. She denies abdominal pain, nausea, vomiting, abdominal cramps, blood in stools. She denies taking any recent antibiotics, sick contacts or eating out. She is here for hemorrhoid banding today  Follow-up visit 03/25/2018 She reports recurrence of dysphagia to solids. She was treated with Flovent for 8 weeks in the past for his peptic esophagitis. She is currently off PPI as well. She  is concerned about intermittent episodes of painless rectal bleeding both on wiping and dripping. She denies constipation. She reports some rectal discomfort every time she has a bowel movement. She is here to undergo hemorrhoid ligation.  Follow-up visit 03/07/2019 She made a follow-up to see me due to episode of rectal bleeding that occurred yesterday.  She reports it was bright red blood, 1-2 teaspoonful.  She reports mild rectal discomfort.  She denies constipation. She underwent hemorrhoid ligation x2 in the past Patient underwent EGD for follow-up of eosinophilic esophagitis which revealed significant improvement in inflammation and eosinophilic count.  She was on Dexilant.  Currently, not on PPI.  She denies dysphagia  Follow-up visit 04/25/2019 Patient reports severe constipation.  She could not afford $3 co-pay for Linzess.  She tries prune juice.  She does report rectal bleeding on wiping   Current Outpatient Medications:    diclofenac (VOLTAREN) 75 MG EC tablet, Take 1 tablet (75 mg total) by mouth 2 (two) times daily after a meal. Do not take with other NSAIDs., Disp: 60 tablet, Rfl: 1   FLUoxetine (PROZAC) 20 MG tablet, Take 1 tablet (20 mg total) by mouth daily., Disp: 90 tablet, Rfl: 1   levothyroxine (SYNTHROID, LEVOTHROID) 75 MCG tablet, Take 1 tablet (75 mcg total) by mouth daily., Disp: 90 tablet, Rfl: 1   FLUoxetine (PROZAC) 20 MG capsule, Take 20 mg by mouth daily., Disp: , Rfl:    Allergies as of 04/25/2019 - Review Complete 04/25/2019  Allergen Reaction Noted   Percocet [oxycodone-acetaminophen] Palpitations 01/20/2015    NSAIDs: none  Antiplts/Anticoagulants/Anti thrombotics: none  GI procedures:  Upper endoscopy 07/22/2017 - Normal  examined duodenum. - Gastritis. Biopsied. - Salmon-colored mucosa suspicious for Barrett's esophagus. Biopsied. - Normal mucosa was found in the entire esophagus. Biopsied. DIAGNOSIS:  A. STOMACH; COLD BIOPSY:  - ANTRAL MUCOSA  WITH CHANGES CONSISTENT WITH HEALING MUCOSAL INJURY.  - NEGATIVE FOR H. PYLORI, DYSPLASIA, AND MALIGNANCY.   B. ESOPHAGUS, DISTAL; COLD BIOPSY:  - GASTROESOPHAGITIS.  - UP TO 40 INTRAEPITHELIAL EOSINOPHILS IN A HIGH-POWER FIELD WITH  EOSINOPHILIC MICROABSCESS FORMATION.  - PANCREATIC ACINAR CELL METAPLASIA.  - NEGATIVE FOR INTESTINAL METAPLASIA, DYSPLASIA, AND MALIGNANCY.   C. ESOPHAGUS; RANDOM BIOPSY:  - SQUAMOUS MUCOSA WITH AN INCREASE IN INTRAEPITHELIAL EOSINOPHILS  INVOLVING ALL BIOPSY FRAGMENTS, SEE COMMENT.  - UP TO 55 EOSINOPHILS IN A HIGH-POWER FIELD WITH EOSINOPHILIC  MICROABSCESS FORMATION.  - NEGATIVE FOR DYSPLASIA AND MALIGNANCY.   Colonoscopy on 09/2017 by Dr. Vicente Males - Non-bleeding internal hemorrhoids. - Diverticulosis in the sigmoid colon. - The examination was otherwise normal on direct and retroflexion views. - No specimens collected.  EGD 03/31/2018 - Normal duodenal bulb and second portion of the duodenum. - Normal stomach. - Esophagogastric landmarks identified. - Normal esophagus. Biopsied.  DIAGNOSIS:  A. DISTAL ESOPHAGUS; COLD BIOPSY:  - SQUAMOUS MUCOSA WITH UP TO 10 IN A HIGH-POWER FIELD INTRAEPITHELIAL  EOSINOPHILS.  - NEGATIVE FOR DYSPLASIA AND MALIGNANCY.   B. PROXIMAL ESOPHAGUS; COLD BIOPSY:  - SQUAMOUS MUCOSA NEGATIVE FOR EOSINOPHILS, DYSPLASIA AND MALIGNANCY.   ROS:  General: Negative for anorexia, weight loss, fever, chills, fatigue, weakness. ENT: Negative for hoarseness, difficulty swallowing , nasal congestion. CV: Negative for chest pain, angina, palpitations, dyspnea on exertion, peripheral edema.  Respiratory: Negative for dyspnea at rest, dyspnea on exertion, cough, sputum, wheezing.  GI: See history of present illness. GU:  Negative for dysuria, hematuria, urinary incontinence, urinary frequency, nocturnal urination.  Endo: Negative for unusual weight change.    Physical Examination:   BP 138/87 (BP Location: Left Arm, Patient  Position: Sitting, Cuff Size: Large)    Pulse 66    Temp 97.9 F (36.6 C)    Resp 16    Ht 5' 1.5" (1.562 m)    Wt 160 lb 12.8 oz (72.9 kg)    LMP 05/09/1993 (Approximate)    BMI 29.89 kg/m   General: Well-nourished, well-developed in no acute distress.  Eyes: No icterus. Conjunctivae pink. Mouth: Oropharyngeal mucosa moist and pink , no lesions erythema or exudate. Lungs: Clear to auscultation bilaterally. Non-labored. Heart: Regular rate and rhythm, no murmurs rubs or gallops.  Abdomen: Bowel sounds are normal, nontender, nondistended, no hepatosplenomegaly or masses, no hernia , no rebound or guarding.   Rectal exam: Nontender, normal perianal exam, hypopigmented perianal skin Extremities: No lower extremity edema. No clubbing or deformities. Neuro: Alert and oriented x 3.  Grossly intact. Skin: Warm and dry, no jaundice.   Psych: Alert and cooperative, normal mood and affect.   Imaging Studies: No results found.  Assessment and Plan:   Kristina Reed is a 53 y.o. female history of eosinophilic esophagitis treated with Flovent in the past, chronic constipation and intermittent rectal bleeding here for follow-up. Prior history of banding about 3 years ago with modest benefit. She now has recurrence of dysphagia  Dysphagia to solids: with history of eosinophilic Esophagitis EGD revealed improvement in eosinophilic count, responsive to PPI Currently asymptomatic - Currently not on PPI  Chronic constipation: Currently worse -Patient says she could not afford $3 co-pay on prescription medication -Provided her with samples for Fiberchoice, MiraLAX and Amitiza  Symptomatic hemorrhoids: -  I performed hemorrhoidal ligation x 3 in the past - Perform hemorrhoid ligation today   Follow up in 2 months   Dr Sherri Sear, MD

## 2019-04-25 NOTE — Progress Notes (Signed)

## 2019-05-02 ENCOUNTER — Telehealth: Payer: Self-pay | Admitting: Gastroenterology

## 2019-05-02 ENCOUNTER — Other Ambulatory Visit: Payer: Self-pay

## 2019-05-02 ENCOUNTER — Other Ambulatory Visit
Admission: RE | Admit: 2019-05-02 | Discharge: 2019-05-02 | Disposition: A | Discharge status: Home / Self Care | Payer: Medicaid Other | Source: Ambulatory Visit | Attending: Gastroenterology | Admitting: Gastroenterology

## 2019-05-02 DIAGNOSIS — K625 Hemorrhage of anus and rectum: Secondary | ICD-10-CM | POA: Insufficient documentation

## 2019-05-02 LAB — CBC
HCT: 36.2 % (ref 36.0–46.0)
Hemoglobin: 12.1 g/dL (ref 12.0–15.0)
MCH: 28.7 pg (ref 26.0–34.0)
MCHC: 33.4 g/dL (ref 30.0–36.0)
MCV: 85.8 fL (ref 80.0–100.0)
Platelets: 191 10*3/uL (ref 150–400)
RBC: 4.22 MIL/uL (ref 3.87–5.11)
RDW: 13 % (ref 11.5–15.5)
WBC: 7 10*3/uL (ref 4.0–10.5)
nRBC: 0 % (ref 0.0–0.2)

## 2019-05-02 NOTE — Telephone Encounter (Signed)
Labs have been ordered, pt has been notified  

## 2019-05-02 NOTE — Telephone Encounter (Signed)
Check CBC 

## 2019-05-02 NOTE — Telephone Encounter (Signed)
Please advise 

## 2019-05-02 NOTE — Telephone Encounter (Signed)
Patient called in stating she was her & saw Dr Marius Ditch on 04-25-19 for hemorrhoid banding. Things were fine and now she's having rectal bleeding. Please call & advise.

## 2019-05-03 ENCOUNTER — Telehealth: Payer: Self-pay | Admitting: Gastroenterology

## 2019-05-03 NOTE — Telephone Encounter (Signed)
Pt is calling for Lab results

## 2019-05-03 NOTE — Telephone Encounter (Signed)
Pt left vm to check on her Lab results

## 2019-05-03 NOTE — Telephone Encounter (Signed)
Patient worried over labs & would like results.

## 2019-05-04 NOTE — Telephone Encounter (Signed)
Pt has been notified of lab results and verbalized understanding

## 2019-05-06 ENCOUNTER — Emergency Department: Payer: Medicaid Other

## 2019-05-06 ENCOUNTER — Encounter: Payer: Self-pay | Admitting: Emergency Medicine

## 2019-05-06 ENCOUNTER — Other Ambulatory Visit: Payer: Self-pay

## 2019-05-06 ENCOUNTER — Emergency Department
Admission: EM | Admit: 2019-05-06 | Discharge: 2019-05-06 | Disposition: A | Discharge status: Home / Self Care | Payer: Medicaid Other | Attending: Student in an Organized Health Care Education/Training Program | Admitting: Student in an Organized Health Care Education/Training Program

## 2019-05-06 DIAGNOSIS — Y92013 Bedroom of single-family (private) house as the place of occurrence of the external cause: Secondary | ICD-10-CM | POA: Diagnosis not present

## 2019-05-06 DIAGNOSIS — I10 Essential (primary) hypertension: Secondary | ICD-10-CM | POA: Insufficient documentation

## 2019-05-06 DIAGNOSIS — Z8541 Personal history of malignant neoplasm of cervix uteri: Secondary | ICD-10-CM | POA: Insufficient documentation

## 2019-05-06 DIAGNOSIS — M79621 Pain in right upper arm: Secondary | ICD-10-CM | POA: Diagnosis not present

## 2019-05-06 DIAGNOSIS — B351 Tinea unguium: Secondary | ICD-10-CM

## 2019-05-06 DIAGNOSIS — Z79899 Other long term (current) drug therapy: Secondary | ICD-10-CM | POA: Diagnosis not present

## 2019-05-06 DIAGNOSIS — S46911A Strain of unspecified muscle, fascia and tendon at shoulder and upper arm level, right arm, initial encounter: Secondary | ICD-10-CM | POA: Insufficient documentation

## 2019-05-06 DIAGNOSIS — T148XXA Other injury of unspecified body region, initial encounter: Secondary | ICD-10-CM

## 2019-05-06 DIAGNOSIS — X58XXXA Exposure to other specified factors, initial encounter: Secondary | ICD-10-CM | POA: Diagnosis not present

## 2019-05-06 DIAGNOSIS — M79601 Pain in right arm: Secondary | ICD-10-CM | POA: Diagnosis not present

## 2019-05-06 DIAGNOSIS — Y9384 Activity, sleeping: Secondary | ICD-10-CM | POA: Diagnosis not present

## 2019-05-06 DIAGNOSIS — Y998 Other external cause status: Secondary | ICD-10-CM | POA: Diagnosis not present

## 2019-05-06 DIAGNOSIS — E039 Hypothyroidism, unspecified: Secondary | ICD-10-CM | POA: Diagnosis not present

## 2019-05-06 DIAGNOSIS — S4991XA Unspecified injury of right shoulder and upper arm, initial encounter: Secondary | ICD-10-CM | POA: Diagnosis not present

## 2019-05-06 MED ORDER — CYCLOBENZAPRINE HCL 10 MG PO TABS
5.0000 mg | ORAL_TABLET | Freq: Once | ORAL | Status: AC
  Administered 2019-05-06: 17:00:00 5 mg via ORAL
  Filled 2019-05-06: qty 1

## 2019-05-06 MED ORDER — CYCLOBENZAPRINE HCL 10 MG PO TABS: 5.0000 mg | ORAL_TABLET | Freq: Three times a day (TID) | ORAL | 0 refills | Status: DC | PRN

## 2019-05-06 MED ORDER — MELOXICAM 15 MG PO TABS: 15.0000 mg | ORAL_TABLET | Freq: Every day | ORAL | 0 refills | Status: DC

## 2019-05-06 MED ORDER — KETOROLAC TROMETHAMINE 30 MG/ML IJ SOLN
30.0000 mg | Freq: Once | INTRAMUSCULAR | Status: AC
  Administered 2019-05-06: 30 mg via INTRAMUSCULAR
  Filled 2019-05-06: qty 1

## 2019-05-06 NOTE — ED Provider Notes (Signed)
[facility] Emergency Department Provider Note ____________________________________________  Time seen: Approximately 7:50 PM  I have reviewed the triage vital signs and the nursing notes.   HISTORY  Chief Complaint Shoulder Pain    HPI Kristina Reed is a 54 y.o. female who presents to the emergency department for evaluation and treatment of right upper arm pain that radiates into her superior shoulder and right side neck.  No specific injury.  Patient states that it was there when she woke up this morning.  No alleviating measures attempted prior to arrival.  Past Medical History:  Diagnosis Date  . Anxiety   . Arthritis    right knee and right elbow  . Cancer ([place])    Cervical CA with partial hysterectomy.  . Cognitive impairment, mild, so stated   . Depression   . Diverticulosis   . Eosinophilic esophagitis 123456   Biopsy Dec 2018  . Family history of breast cancer   . Gallstones   . GERD (gastroesophageal reflux disease)   . History of cervical cancer   . Hx of cervical cancer 01/30/2016  . Hypertension   . Hypothyroidism   . Sleep apnea    does not use a C-PAP  . Status post partial hysterectomy    Due to Cervical CA  . Vaginal inclusion cyst     Patient Active Problem List   Diagnosis Date Noted  . Genetic testing 04/20/2019  . Family history of breast cancer   . History of cervical cancer   . Mastalgia 03/17/2019  . Family hx-breast malignancy 12/16/2018  . Esophageal dysphagia   . Essential hypertension 01/19/2018  . Obesity (BMI 30.0-34.9) 01/19/2018  . Preventative health care 09/03/2017  . Eosinophilic esophagitis 123456  . Apophysitis 08/26/2017  . 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 Pain in joint, multiple sites 07/05/2017  . Osteoarthritis of knee 07/05/2017  . Lichen sclerosus et atrophicus 04/27/2017  . Headache 04/27/2017  . Headache disorder 02/17/2017  . Post-concussion headache 02/17/2017  . Medication  monitoring encounter 09/21/2016  . Hematuria 04/23/2016  . Hx of cervical cancer 01/30/2016  . Menopause 01/30/2016  . Status post bilateral breast biopsy 01/07/2016  . Chronic nausea 10/25/2015  . Screening for STD (sexually transmitted disease) 10/25/2015  . Rectal bleeding 04/30/2015  . Right knee pain 07/23/2014  . Depression, major, recurrent, moderate ([place]) 02/02/2014  . Adult hypothyroidism 02/02/2014  . Obstructive apnea 02/02/2014  . Anxiety and depression 02/02/2014  . Incomplete bladder emptying 12/12/2012  . Tenosynovitis of foot 05/30/2012  . Benign neoplasm of kidney 05/13/2012  . Urge incontinence 05/13/2012  . FOM (frequency of micturition) 05/13/2012  . Chronic pain of right hand 05/05/2012  . Tarsal tunnel syndrome 03/03/2012    Past Surgical History:  Procedure Laterality Date  . ABDOMINAL HYSTERECTOMY     partial hysterectomy  . BREAST BIOPSY Bilateral 01/01/2016   Abnormal screening mammogram, ultrasound-guided core biopsy. Fibroadenomas   . COLONOSCOPY WITH PROPOFOL N/A 06/27/2015   Procedure: COLONOSCOPY WITH PROPOFOL;  Surgeon: Josefine Class, MD;  Location: [facility] ENDOSCOPY;  Service: Endoscopy;  Laterality: N/A;  . COLONOSCOPY WITH PROPOFOL N/A 09/23/2017   Procedure: COLONOSCOPY WITH PROPOFOL;  Surgeon: Jonathon Bellows, MD;  Location: [facility] ENDOSCOPY;  Service: Gastroenterology;  Laterality: N/A;  . ESOPHAGOGASTRODUODENOSCOPY (EGD) WITH PROPOFOL N/A 07/22/2017   Procedure: ESOPHAGOGASTRODUODENOSCOPY (EGD) WITH PROPOFOL;  Surgeon: Jonathon Bellows, MD;  Location: [facility] ENDOSCOPY;  Service: Gastroenterology;  Laterality: N/A;  . ESOPHAGOGASTRODUODENOSCOPY (EGD) WITH PROPOFOL N/A 03/31/2018   Procedure:  ESOPHAGOGASTRODUODENOSCOPY (EGD) WITH PROPOFOL;  Surgeon: Lin Landsman, MD;  Location: [facility] ENDOSCOPY;  Service: Gastroenterology;  Laterality: N/A;  . KNEE ARTHROSCOPY Right 07/22/2016   Procedure: ARTHROSCOPY KNEE debridement microfracture;  Surgeon: Leanor Kail, MD;  Location: [facility] ORS;  Service: Orthopedics;  Laterality: Right;  . KNEE ARTHROSCOPY WITH MEDIAL MENISECTOMY Left 11/10/2017   Procedure: KNEE ARTHROSCOPY WITH PARTIAL MEDIAL MENISECTOMY&patella femoral debridement, & ablation;  Surgeon: Leanor Kail, MD;  Location: [facility] ORS;  Service: Orthopedics;  Laterality: Left;  . TUBAL LIGATION      Prior to Admission medications   Medication Sig Start Date End Date Taking? Authorizing Provider  cyclobenzaprine (FLEXERIL) 10 MG tablet Take 0.5 tablets (5 mg total) by mouth 3 (three) times daily as needed for muscle spasms. 05/06/19   Bronte Kropf, Johnette Abraham B, FNP  diclofenac (VOLTAREN) 75 MG EC tablet Take 1 tablet (75 mg total) by mouth 2 (two) times daily after a meal. Do not take with other NSAIDs. 03/27/19 05/26/19  Gillis Santa, MD  FLUoxetine (PROZAC) 20 MG capsule Take 20 mg by mouth daily. 03/20/19   [provider]  FLUoxetine (PROZAC) 20 MG tablet Take 1 tablet (20 mg total) by mouth daily. 03/20/19   Poulose, Bethel Born, NP  levothyroxine (SYNTHROID, LEVOTHROID) 75 MCG tablet Take 1 tablet (75 mcg total) by mouth daily. 09/05/18   [name], MD  meloxicam (MOBIC) 15 MG tablet Take 1 tablet (15 mg total) by mouth daily. 05/06/19   Victorino Dike, FNP    Allergies Percocet [oxycodone-acetaminophen]  Family History  Problem Relation Age of Onset  . Diabetes Mother   . Breast cancer Maternal Grandmother   . Cancer Paternal Grandmother        cancer?  . Cirrhosis Cousin   . Breast cancer Other        x4    Social History Social History   Tobacco Use  . Smoking status: Never Smoker  . Smokeless tobacco: Never Used  Substance Use Topics  . Alcohol use: Yes    Comment: twice a week  . Drug use: No    Review of Systems Constitutional: Negative for fever. Cardiovascular: Negative for chest pain. Respiratory: Negative for shortness of breath. Musculoskeletal: Positive for right upper arm pain with radiation into  the shoulder and neck. Skin: Negative for open wounds or lesions. Neurological: Negative for decrease in sensation  ____________________________________________   PHYSICAL EXAM:  VITAL SIGNS: ED Triage Vitals  Enc Vitals Group     BP 05/06/19 1600 124/78     Pulse Rate 05/06/19 1600 83     Resp 05/06/19 1600 17     Temp 05/06/19 1600 98.8 F (37.1 C)     Temp Source 05/06/19 1600 Oral     SpO2 05/06/19 1600 97 %     Weight 05/06/19 1601 160 lb (72.6 kg)     Height 05/06/19 1601 5\' 1"  (1.549 m)     Head Circumference --      Peak Flow --      Pain Score 05/06/19 1601 10     Pain Loc --      Pain Edu? --      Excl. in [place]? --     Constitutional: Alert and oriented. Well appearing and in no acute distress. Eyes: Conjunctivae are clear without discharge or drainage Head: Atraumatic Neck: Supple. Respiratory: No cough. Respirations are even and unlabored. Musculoskeletal: Focal tenderness over the proximal humerus. Neurologic: Motor and sensory function of the  right upper extremity is intact. Skin: No deformity over the area of tenderness on the right proximal humerus.  No open wounds or lesions.  No contusions. Psychiatric: Affect and behavior are appropriate.  ____________________________________________   LABS (all labs ordered are listed, but only abnormal results are displayed)  Labs Reviewed - No data to display ____________________________________________  RADIOLOGY  Image of the right humerus is negative for acute findings per radiology. ____________________________________________   PROCEDURES  Procedures  ____________________________________________   INITIAL IMPRESSION / ASSESSMENT AND PLAN / ED COURSE  Kristina Reed is a 53 y.o. who presents to the emergency department for treatment and evaluation of right upper extremity pain with radiation into the right shoulder and neck.  X-ray is reassuring.  She will be treated with Flexeril and meloxicam.  She  was instructed to follow-up with a primary care provider for symptoms that are not improving with time and medications.  Just prior to discharge, the husband asked me to evaluate her right foot.  It does appear that she has fungal infection of the nails.  They are very hyperkeratotic and discolored.  Referral was given for podiatry and they were instructed to call for an appointment.   Medications  ketorolac (TORADOL) 30 MG/ML injection 30 mg (30 mg Intramuscular Given 05/06/19 1715)  cyclobenzaprine (FLEXERIL) tablet 5 mg (5 mg Oral Given 05/06/19 1715)    Pertinent labs & imaging results that were available during my care of the patient were reviewed by me and considered in my medical decision making (see chart for details).  _________________________________________   FINAL CLINICAL IMPRESSION(S) / ED DIAGNOSES  Final diagnoses:  Musculoskeletal strain  Fungal toenail infection    ED Discharge Orders         Ordered    cyclobenzaprine (FLEXERIL) 10 MG tablet  3 times daily PRN     05/06/19 1726    meloxicam (MOBIC) 15 MG tablet  Daily     05/06/19 1726           If controlled substance prescribed during this visit, 12 month history viewed on the [place] prior to issuing an initial prescription for Schedule II or III opiod.   Victorino Dike, FNP 05/06/19 1954    Merlyn Lot, MD 05/06/19 2038

## 2019-05-06 NOTE — Discharge Instructions (Signed)
Please call and schedule follow-up appointment with the podiatrist, Dr. Vickki Muff. Be aware that the Flexeril may make you feel sleepy. Follow-up with your primary care provider if the pain in your arm does not begin to improve over the next few days. Return to the emergency department for symptoms change or worsen if you are unable to schedule an appointment.

## 2019-05-06 NOTE — ED Triage Notes (Signed)
Pt presents from home via acems with c/o right shoulder pain that radiates to right side of neck. Pt denies any recent falls or injuries. Pt able to move extremity, but pt states movement is painful.

## 2019-05-08 ENCOUNTER — Ambulatory Visit: Payer: Self-pay | Admitting: *Deleted

## 2019-05-08 DIAGNOSIS — B351 Tinea unguium: Secondary | ICD-10-CM

## 2019-05-08 NOTE — Telephone Encounter (Signed)
Please advise 

## 2019-05-08 NOTE — Telephone Encounter (Signed)
Copied from [place] [id number]. Topic: Referral - Request for Referral >> May 08, 2019  9:23 AM Nils Flack wrote: Has patient seen PCP for this complaint? No. Was at ed and they told pt to see podiatrist  *If NO, is insurance requiring patient see PCP for this issue before PCP can refer them? Referral for which specialty: podiatry [facility] provider[facility]  Reason for referral: toe nail fungus  Says it needs to be sent over asap, her nails look like a dogs nails   [phone]

## 2019-05-08 NOTE — Telephone Encounter (Signed)
Contacted pt due to concerns about toe nail fungus on both feet on all toes; she has pain when she walks or puts on shoes; her toe nail curve downward; the pt was referred to podiatry per the ED physician on 05/06/2019; she was referred to Dr Samara Deist, Podiatry; the pt was directed to contact her PCP to obtain the paperwork; she can be contacted at [phone], and a message can be left on the voicemail; her significant other Jeneen Rinks can also be contacted [phone]; the pt sees Raelyn Ensign, [facility]; will route to office for final disposition.  Reason for Disposition . [1] Caller requesting NON-URGENT health information AND [2] PCP's office is the best resource  Answer Assessment - Initial Assessment Questions 1. REASON FOR CALL or QUESTION: "What is your reason for calling today?" or "How can I best help you?" or "What question do you have that I can help answer?"     Referral paperwork for podiatry  Protocols used: [place]

## 2019-05-08 NOTE — Telephone Encounter (Signed)
Referral has been placed and sent to [facility] Vickki Muff) as requested.

## 2019-05-08 NOTE — Addendum Note (Signed)
Addended by: Marland Kitchen A on: 05/08/2019 12:31 PM   Modules accepted: Orders

## 2019-05-12 DIAGNOSIS — M79672 Pain in left foot: Secondary | ICD-10-CM | POA: Diagnosis not present

## 2019-05-12 DIAGNOSIS — M79671 Pain in right foot: Secondary | ICD-10-CM | POA: Diagnosis not present

## 2019-05-12 DIAGNOSIS — B351 Tinea unguium: Secondary | ICD-10-CM | POA: Diagnosis not present

## 2019-05-12 DIAGNOSIS — Z596 Low income: Secondary | ICD-10-CM | POA: Diagnosis not present

## 2019-05-23 ENCOUNTER — Ambulatory Visit: Payer: Medicaid Other | Admitting: General Surgery

## 2019-05-30 ENCOUNTER — Ambulatory Visit: Payer: Medicaid Other | Admitting: General Surgery

## 2019-05-30 ENCOUNTER — Encounter: Payer: Self-pay | Admitting: General Surgery

## 2019-05-30 ENCOUNTER — Other Ambulatory Visit: Payer: Self-pay

## 2019-05-30 VITALS — BP 131/85 | HR 74 | Temp 97.7°F | Ht 61.0 in | Wt 160.0 lb

## 2019-05-30 DIAGNOSIS — N644 Mastodynia: Secondary | ICD-10-CM

## 2019-05-30 DIAGNOSIS — Z596 Low income: Secondary | ICD-10-CM | POA: Diagnosis not present

## 2019-05-30 NOTE — Patient Instructions (Signed)
May use a course of Ibuprofen. Take 400-600 mg of Ibuprofen 3 times a day for 3 weeks.  May also use Evening Primrose Oil, 3-4 grams(3000-4000mg ) daily.    Follow-up with our office as needed.  Please call and ask to speak with a nurse if you develop questions or concerns.

## 2019-05-31 NOTE — Progress Notes (Signed)
Patient ID: Kristina Reed, female   DOB: 18-Jun-1966, 53 y.o.   MRN: [id number]  Chief Complaint  Patient presents with  . Other    right breast pain    HPI Kristina Reed is a 53 y.o. female.  She was seen in July by Dr. Bary Castilla for mastalgia.  Her mammograms do not show any evidence of malignancy, but are consistent with fibrocystic breast disease.  At his visit with her, she was encouraged to take Ocuvite and cease caffeine intake.  She continues to have discomfort in her right breast and is here today for reevaluation.  He states that the vitamins did not help and that she has stopped taking caffeine.  She has not noticed any changes in the structure of her breasts, but does not perform self exams.  She is a somewhat difficult historian and is unable to describe for me any alleviating or exacerbating factors.   Past Medical History:  Diagnosis Date  . Anxiety   . Arthritis    right knee and right elbow  . Cancer ([place])    Cervical CA with partial hysterectomy.  . Cognitive impairment, mild, so stated   . Depression   . Diverticulosis   . Eosinophilic esophagitis 123456   Biopsy Dec 2018  . Family history of breast cancer   . Gallstones   . GERD (gastroesophageal reflux disease)   . History of cervical cancer   . Hx of cervical cancer 01/30/2016  . Hypertension   . Hypothyroidism   . Sleep apnea    does not use a C-PAP  . Status post partial hysterectomy    Due to Cervical CA  . Vaginal inclusion cyst     Past Surgical History:  Procedure Laterality Date  . ABDOMINAL HYSTERECTOMY     partial hysterectomy  . BREAST BIOPSY Bilateral 01/01/2016   Abnormal screening mammogram, ultrasound-guided core biopsy. Fibroadenomas   . COLONOSCOPY WITH PROPOFOL N/A 06/27/2015   Procedure: COLONOSCOPY WITH PROPOFOL;  Surgeon: Josefine Class, MD;  Location: [facility] ENDOSCOPY;  Service: Endoscopy;  Laterality: N/A;  . COLONOSCOPY WITH PROPOFOL N/A 09/23/2017   Procedure: COLONOSCOPY  WITH PROPOFOL;  Surgeon: Jonathon Bellows, MD;  Location: [facility] ENDOSCOPY;  Service: Gastroenterology;  Laterality: N/A;  . ESOPHAGOGASTRODUODENOSCOPY (EGD) WITH PROPOFOL N/A 07/22/2017   Procedure: ESOPHAGOGASTRODUODENOSCOPY (EGD) WITH PROPOFOL;  Surgeon: Jonathon Bellows, MD;  Location: [facility] ENDOSCOPY;  Service: Gastroenterology;  Laterality: N/A;  . ESOPHAGOGASTRODUODENOSCOPY (EGD) WITH PROPOFOL N/A 03/31/2018   Procedure: ESOPHAGOGASTRODUODENOSCOPY (EGD) WITH PROPOFOL;  Surgeon: Lin Landsman, MD;  Location: [facility] ENDOSCOPY;  Service: Gastroenterology;  Laterality: N/A;  . KNEE ARTHROSCOPY Right 07/22/2016   Procedure: ARTHROSCOPY KNEE debridement microfracture;  Surgeon: Leanor Kail, MD;  Location: [facility] ORS;  Service: Orthopedics;  Laterality: Right;  . KNEE ARTHROSCOPY WITH MEDIAL MENISECTOMY Left 11/10/2017   Procedure: KNEE ARTHROSCOPY WITH PARTIAL MEDIAL MENISECTOMY&patella femoral debridement, & ablation;  Surgeon: Leanor Kail, MD;  Location: [facility] ORS;  Service: Orthopedics;  Laterality: Left;  . TUBAL LIGATION      Family History  Problem Relation Age of Onset  . Diabetes Mother   . Breast cancer Maternal Grandmother   . Cancer Paternal Grandmother        cancer?  . Cirrhosis Cousin   . Breast cancer Other        x4    Social History Social History   Tobacco Use  . Smoking status: Never Smoker  . Smokeless tobacco: Never Used  Substance Use Topics  .  Alcohol use: Yes    Comment: twice a week  . Drug use: No    Allergies  Allergen Reactions  . Percocet [Oxycodone-Acetaminophen] Palpitations    Current Outpatient Medications  Medication Sig Dispense Refill  . cyclobenzaprine (FLEXERIL) 10 MG tablet Take 0.5 tablets (5 mg total) by mouth 3 (three) times daily as needed for muscle spasms. 30 tablet 0  . FLUoxetine (PROZAC) 20 MG capsule Take 20 mg by mouth daily.    Marland Kitchen FLUoxetine (PROZAC) 20 MG tablet Take 1 tablet (20 mg total) by mouth daily. 90 tablet 1  .  levothyroxine (SYNTHROID, LEVOTHROID) 75 MCG tablet Take 1 tablet (75 mcg total) by mouth daily. 90 tablet 1  . meloxicam (MOBIC) 15 MG tablet Take 1 tablet (15 mg total) by mouth daily. 30 tablet 0   No current facility-administered medications for this visit.     Review of Systems Review of Systems  All other systems reviewed and are negative.   Blood pressure 131/85, pulse 74, temperature 97.7 F (36.5 C), temperature source Skin, height 5\' 1"  (1.549 m), weight 160 lb (72.6 kg), last menstrual period 05/09/1993, SpO2 98 %.  Physical Exam Physical Exam Constitutional:      General: She is not in acute distress.    Appearance: She is obese.  HENT:     Head: Normocephalic and atraumatic.     Nose:     Comments: Covered with a mask secondary to COVID-19 precautions    Mouth/Throat:     Comments: Covered with a mask secondary to COVID-19 precautions Eyes:     General:        Right eye: No discharge.     Comments: Wearing glasses  Neck:     Musculoskeletal: Normal range of motion.  Cardiovascular:     Rate and Rhythm: Normal rate and regular rhythm.     Pulses: Normal pulses.  Pulmonary:     Effort: Pulmonary effort is normal.     Breath sounds: Normal breath sounds.  Chest:     Breasts:        Right: Tenderness present.        Left: Normal.       Comments: The breast tissue is dense with plaques of fibrocystic disease.  No dominant masses are appreciated.  There is tenderness in the right upper outer quadrant without associated skin changes or underlying mass. Abdominal:     General: Abdomen is flat. Bowel sounds are normal.     Palpations: Abdomen is soft.  Genitourinary:    Comments: Deferred Musculoskeletal:     Right lower leg: No edema.     Left lower leg: No edema.  Lymphadenopathy:     Cervical: No cervical adenopathy.     Upper Body:     Right upper body: No supraclavicular adenopathy.     Left upper body: No supraclavicular adenopathy.  Skin:     General: Skin is warm and dry.  Neurological:     General: No focal deficit present.     Mental Status: She is alert.  Psychiatric:        Mood and Affect: Mood normal.        Behavior: Behavior normal.     Data Reviewed I reviewed Dr. Dwyane Luo note from July, wherein he delineated the plan for Ocuvite.  I also reviewed her mammogram and ultrasound studies that were done in June.  There were no suspicious masses or calcifications and no abnormality seen in the area of tenderness.  Fibroglandular density was described in both breasts.  Assessment This is a 53 year old woman with mastalgia.  No structural findings are seen on imaging and none are appreciated on physical examination.  She says that she tried Ocuvite without success and no longer consumes any caffeinated beverages.  She continues to endorse discomfort, particularly on the right.  Plan After discussing with her that there are no validated studies proving its efficacy, I suggested that she could try taking 3 to 4 g of evening primrose oil per day as some anecdotal reports suggest efficacy of this agent for mastalgia.  Nonsteroidal anti-inflammatories have also been used with some benefit.  This was also recommended.  She does not have any surgical cause for her discomfort.  I do not think that we have anything to offer her in the general surgery clinic.  She should follow-up with her primary care provider, Raelyn Ensign, and continue to get annual mammograms.    Fredirick Maudlin 05/31/2019, 1:02 PM

## 2019-06-05 ENCOUNTER — Ambulatory Visit: Payer: Medicaid Other | Admitting: Gastroenterology

## 2019-06-12 ENCOUNTER — Ambulatory Visit: Payer: Medicaid Other

## 2019-06-13 ENCOUNTER — Ambulatory Visit: Payer: Medicaid Other

## 2019-06-16 ENCOUNTER — Ambulatory Visit (INDEPENDENT_AMBULATORY_CARE_PROVIDER_SITE_OTHER): Payer: Medicaid Other

## 2019-06-16 ENCOUNTER — Other Ambulatory Visit: Payer: Self-pay

## 2019-06-16 DIAGNOSIS — Z596 Low income: Secondary | ICD-10-CM | POA: Diagnosis not present

## 2019-06-16 DIAGNOSIS — Z23 Encounter for immunization: Secondary | ICD-10-CM | POA: Diagnosis not present

## 2019-06-27 ENCOUNTER — Telehealth: Payer: Self-pay | Admitting: Emergency Medicine

## 2019-06-27 NOTE — Telephone Encounter (Signed)
Copied from [place] [id number]. Topic: General - Other >> Jun 26, 2019  9:53 AM Leward Quan A wrote: Reason for CRM: Patient called to say that she need an order for depends / adult briefs because she cannot hold her bowels or urine by the time she get to the bathroom she is all messed up asking for Rx to be sent to her pharmacy for size medium briefs. Please call patient at Ph# [phone]

## 2019-06-27 NOTE — Telephone Encounter (Signed)
Agree that she needs an appointment as this is a new problem.

## 2019-06-27 NOTE — Telephone Encounter (Signed)
Patient stated she cannot hold her urine or bowels. This has been going on for 2 weeks. She is going to bathroom every hour in a half. Would like script for Depends. Patient was advises to make appointment since this was new onset and it maybe an infection.

## 2019-06-28 ENCOUNTER — Ambulatory Visit (INDEPENDENT_AMBULATORY_CARE_PROVIDER_SITE_OTHER): Payer: Medicaid Other | Admitting: Family Medicine

## 2019-06-28 ENCOUNTER — Encounter: Payer: Self-pay | Admitting: Family Medicine

## 2019-06-28 ENCOUNTER — Other Ambulatory Visit: Payer: Self-pay

## 2019-06-28 DIAGNOSIS — N39498 Other specified urinary incontinence: Secondary | ICD-10-CM | POA: Diagnosis not present

## 2019-06-28 DIAGNOSIS — K625 Hemorrhage of anus and rectum: Secondary | ICD-10-CM | POA: Diagnosis not present

## 2019-06-28 DIAGNOSIS — R350 Frequency of micturition: Secondary | ICD-10-CM

## 2019-06-28 DIAGNOSIS — R32 Unspecified urinary incontinence: Secondary | ICD-10-CM

## 2019-06-28 DIAGNOSIS — R159 Full incontinence of feces: Secondary | ICD-10-CM | POA: Diagnosis not present

## 2019-06-28 NOTE — Progress Notes (Signed)
Name: Kristina Reed   MRN: [id number]    DOB: 12/08/1965   Date:06/28/2019       Progress Note  Subjective  Chief Complaint  Chief Complaint  Patient presents with  . Urinary Incontinence    for 2 weeks new onset  . Encopresis    I connected with  [name] on 06/28/19 at  8:00 AM EDT by telephone and verified that I am speaking with the correct person using two identifiers.   I discussed the limitations, risks, security and privacy concerns of performing an evaluation and management service by telephone and the availability of in person appointments. Staff also discussed with the patient that there may be a patient responsible charge related to this service. Patient Location: Home Provider Location: Home Office Additional Individuals present: None  HPI  Pt presents with concern for urinary incontinence, urinary frequency, and urinary urgency.  She also notes bowel incontinence at times.  Both issues started about 2-3 weeks ago.  She notes she has had similar issues in the past.   Bowel Incontinence: She notes that she is having some bright red blood per rectum with each BM; she has about 1 BM per day right now.  She does endorse some ocassional lightheadedness. Denies diarrhea or constipation, denies abdominal pain, nausea or vomiting; she denies any weakness/numbness/tingling in the legs, no recent back injuries.  Has seen Dr. Marius Ditch in the past for blood in stool, has been doing banding for her hemorrhoids.  Does have an appointment in 6 days with Dr. Marius Ditch to follow up.    Urinary Incontinence: she is noting urinary frequency, urinary urgency, she notes urinary incontinence nearly every time she has to urinate.  Denies abdominal pain, back pain, nausea, vomiting, dysuria, frank hematuria. Does note history of recurrent catheterization during prior hospital admissions.   Patient Active Problem List   Diagnosis Date Noted  . Genetic testing 04/20/2019  . Family history of breast  cancer   . History of cervical cancer   . Mastalgia 03/17/2019  . Family hx-breast malignancy 12/16/2018  . Esophageal dysphagia   . Essential hypertension 01/19/2018  . Obesity (BMI 30.0-34.9) 01/19/2018  . Preventative health care 09/03/2017  . Eosinophilic esophagitis 123456  . Apophysitis 08/26/2017  . 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 Pain in joint, multiple sites 07/05/2017  . Osteoarthritis of knee 07/05/2017  . Lichen sclerosus et atrophicus 04/27/2017  . Headache 04/27/2017  . Headache disorder 02/17/2017  . Post-concussion headache 02/17/2017  . Medication monitoring encounter 09/21/2016  . Hematuria 04/23/2016  . Hx of cervical cancer 01/30/2016  . Menopause 01/30/2016  . Status post bilateral breast biopsy 01/07/2016  . Chronic nausea 10/25/2015  . Screening for STD (sexually transmitted disease) 10/25/2015  . Rectal bleeding 04/30/2015  . Right knee pain 07/23/2014  . Depression, major, recurrent, moderate ([place]) 02/02/2014  . Adult hypothyroidism 02/02/2014  . Obstructive apnea 02/02/2014  . Anxiety and depression 02/02/2014  . Incomplete bladder emptying 12/12/2012  . Tenosynovitis of foot 05/30/2012  . Benign neoplasm of kidney 05/13/2012  . Urge incontinence 05/13/2012  . FOM (frequency of micturition) 05/13/2012  . Chronic pain of right hand 05/05/2012  . Tarsal tunnel syndrome 03/03/2012    Social History   Tobacco Use  . Smoking status: Never Smoker  . Smokeless tobacco: Never Used  Substance Use Topics  . Alcohol use: Yes    Comment: twice a week     Current Outpatient Medications:  .  cyclobenzaprine (FLEXERIL)  10 MG tablet, Take 0.5 tablets (5 mg total) by mouth 3 (three) times daily as needed for muscle spasms., Disp: 30 tablet, Rfl: 0 .  FLUoxetine (PROZAC) 20 MG tablet, Take 1 tablet (20 mg total) by mouth daily., Disp: 90 tablet, Rfl: 1 .  levothyroxine (SYNTHROID, LEVOTHROID) 75 MCG tablet, Take 1 tablet (75 mcg total) by mouth  daily., Disp: 90 tablet, Rfl: 1 .  meloxicam (MOBIC) 15 MG tablet, Take 1 tablet (15 mg total) by mouth daily., Disp: 30 tablet, Rfl: 0 .  FLUoxetine (PROZAC) 20 MG capsule, Take 20 mg by mouth daily., Disp: , Rfl:   Allergies  Allergen Reactions  . Percocet [Oxycodone-Acetaminophen] Palpitations    I personally reviewed active problem list, medication list, allergies, notes from last encounter, lab results with the patient/caregiver today.  ROS  Ten systems reviewed and is negative except as mentioned in HPI  Objective  Virtual encounter, vitals not obtained.  There is no height or weight on file to calculate BMI.  Nursing Note and Vital Signs reviewed.  Physical Exam  Neurological: Pt is alert and oriented to person, place, and time. Speech is normal. Psychiatric: Patient has a normal mood and affect. behavior is normal. Judgment and thought content normal.  No results found for this or any previous visit (from the past 72 hour(s)).  Assessment & Plan  1. Frequent urinary incontinence - Urinalysis, Complete - Urine Culture - Ambulatory referral to Urology - CBC with Differential/Platelet - COMPLETE METABOLIC PANEL WITH GFR  2. Bowel and bladder incontinence - CBC with Differential/Platelet - COMPLETE METABOLIC PANEL WITH GFR  3. Urinary frequency - Ambulatory referral to Urology - COMPLETE METABOLIC PANEL WITH GFR  4. BRBPR (bright red blood per rectum) - CBC with Differential/Platelet  Unclear etiology for incontinence at this point; do not suspect acute/emergent pathology as she is well sounding, endorses ability to ambulate without issue, and denies any neurologic symptoms aside from incontinence.  Will forward my note to Dr. Marius Ditch to request that she kindly address the bowel incontinence at the patient's upcoming follow up.  Referral to urology for urinary incontinence.  If either specialty feels this requires a more in-depth neurologic evaluation, I am happy  to coordinate this.  -Red flags and when to present for emergency care or RTC including fever >101.25F, chest pain, shortness of breath, new/worsening/un-resolving symptoms, saddle anesthesia, back or abdominal pain, frank hematuria reviewed with patient at time of visit. Follow up and care instructions discussed and provided in AVS. - I discussed the assessment and treatment plan with the patient. The patient was provided an opportunity to ask questions and all were answered. The patient agreed with the plan and demonstrated an understanding of the instructions.  - The patient was advised to call back or seek an in-person evaluation if the symptoms worsen or if the condition fails to improve as anticipated.  I provided 16 minutes of non-face-to-face time during this encounter.  Hubbard Hartshorn, FNP

## 2019-06-30 DIAGNOSIS — R32 Unspecified urinary incontinence: Secondary | ICD-10-CM | POA: Diagnosis not present

## 2019-06-30 DIAGNOSIS — N39498 Other specified urinary incontinence: Secondary | ICD-10-CM | POA: Diagnosis not present

## 2019-06-30 DIAGNOSIS — Z596 Low income: Secondary | ICD-10-CM | POA: Diagnosis not present

## 2019-06-30 DIAGNOSIS — K625 Hemorrhage of anus and rectum: Secondary | ICD-10-CM | POA: Diagnosis not present

## 2019-06-30 DIAGNOSIS — R159 Full incontinence of feces: Secondary | ICD-10-CM | POA: Diagnosis not present

## 2019-06-30 DIAGNOSIS — R350 Frequency of micturition: Secondary | ICD-10-CM | POA: Diagnosis not present

## 2019-07-02 LAB — COMPLETE METABOLIC PANEL WITH GFR
AG Ratio: 1.7 (calc) (ref 1.0–2.5)
ALT: 26 U/L (ref 6–29)
AST: 30 U/L (ref 10–35)
Albumin: 4.1 g/dL (ref 3.6–5.1)
Alkaline phosphatase (APISO): 67 U/L (ref 37–153)
BUN: 13 mg/dL (ref 7–25)
CO2: 30 mmol/L (ref 20–32)
Calcium: 9 mg/dL (ref 8.6–10.4)
Chloride: 99 mmol/L (ref 98–110)
Creat: 0.73 mg/dL (ref 0.50–1.05)
GFR, Est African American: 111 mL/min/{1.73_m2} (ref >=60)
GFR, Est Non African American: 95 mL/min/{1.73_m2} (ref >=60)
Globulin: 2.4 g/dL (calc) (ref 1.9–3.7)
Glucose, Bld: 85 mg/dL (ref 65–99)
Potassium: 4.2 mmol/L (ref 3.5–5.3)
Sodium: 137 mmol/L (ref 135–146)
Total Bilirubin: 0.2 mg/dL (ref 0.2–1.2)
Total Protein: 6.5 g/dL (ref 6.1–8.1)

## 2019-07-02 LAB — CBC WITH DIFFERENTIAL/PLATELET
Absolute Monocytes: 329 cells/uL (ref 200–950)
Basophils Absolute: 59 cells/uL (ref 0–200)
Basophils Relative: 1.1 %
Eosinophils Absolute: 356 cells/uL (ref 15–500)
Eosinophils Relative: 6.6 %
HCT: 38.6 % (ref 35.0–45.0)
Hemoglobin: 12.8 g/dL (ref 11.7–15.5)
Lymphs Abs: 1604 cells/uL (ref 850–3900)
MCH: 28.9 pg (ref 27.0–33.0)
MCHC: 33.2 g/dL (ref 32.0–36.0)
MCV: 87.1 fL (ref 80.0–100.0)
MPV: 10.5 fL (ref 7.5–12.5)
Monocytes Relative: 6.1 %
Neutro Abs: 3051 cells/uL (ref 1500–7800)
Neutrophils Relative %: 56.5 %
Platelets: 172 10*3/uL (ref 140–400)
RBC: 4.43 10*6/uL (ref 3.80–5.10)
RDW: 13.3 % (ref 11.0–15.0)
Total Lymphocyte: 29.7 %
WBC: 5.4 10*3/uL (ref 3.8–10.8)

## 2019-07-02 LAB — URINALYSIS, COMPLETE
Bilirubin Urine: NEGATIVE
Glucose, UA: NEGATIVE
Hgb urine dipstick: NEGATIVE
Hyaline Cast: NONE SEEN /LPF
Ketones, ur: NEGATIVE
Nitrite: NEGATIVE
Protein, ur: NEGATIVE
Specific Gravity, Urine: 1.02 (ref 1.001–1.03)
pH: 7.5 (ref 5.0–8.0)

## 2019-07-02 LAB — URINE CULTURE
MICRO NUMBER:: 999154
SPECIMEN QUALITY:: ADEQUATE

## 2019-07-03 ENCOUNTER — Other Ambulatory Visit: Payer: Self-pay | Admitting: Family Medicine

## 2019-07-03 DIAGNOSIS — N39498 Other specified urinary incontinence: Secondary | ICD-10-CM

## 2019-07-03 DIAGNOSIS — N3000 Acute cystitis without hematuria: Secondary | ICD-10-CM

## 2019-07-03 DIAGNOSIS — R350 Frequency of micturition: Secondary | ICD-10-CM

## 2019-07-03 MED ORDER — SULFAMETHOXAZOLE-TRIMETHOPRIM 800-160 MG PO TABS: 1.0000 | ORAL_TABLET | Freq: Two times a day (BID) | ORAL | 0 refills | Status: AC

## 2019-07-04 ENCOUNTER — Ambulatory Visit: Payer: Medicaid Other | Admitting: Gastroenterology

## 2019-08-01 ENCOUNTER — Encounter: Payer: Self-pay | Admitting: Family Medicine

## 2019-08-01 ENCOUNTER — Ambulatory Visit (INDEPENDENT_AMBULATORY_CARE_PROVIDER_SITE_OTHER): Payer: Medicaid Other | Admitting: Family Medicine

## 2019-08-01 ENCOUNTER — Other Ambulatory Visit: Payer: Self-pay

## 2019-08-01 VITALS — BP 110/70 | HR 73 | Temp 97.1°F | Resp 16 | Ht 61.0 in | Wt 157.1 lb

## 2019-08-01 DIAGNOSIS — M25561 Pain in right knee: Secondary | ICD-10-CM

## 2019-08-01 DIAGNOSIS — M7121 Synovial cyst of popliteal space [Baker], right knee: Secondary | ICD-10-CM

## 2019-08-01 DIAGNOSIS — Z596 Low income: Secondary | ICD-10-CM | POA: Diagnosis not present

## 2019-08-01 NOTE — Patient Instructions (Signed)
Apply a compressive ACE bandage. Rest and elevate the affected painful area.  Apply cold compresses intermittently as needed.  As pain recedes, begin normal activities slowly as tolerated.  Call if symptoms persist.

## 2019-08-01 NOTE — Progress Notes (Signed)
Acute Office Visit  Subjective:    Patient ID: Kristina Reed, female    DOB: 04/27/1966, 53 y.o.   MRN: [id number]  Chief Complaint  Patient presents with  . Knee Pain    back of right knee swollen, painful    HPI Patient is in today for right postibial knee pain with swelling for 2 days. She reports throbbing and sharp when touched, it is a 10/10. There is history of injury to the right lower extremity with arthroscopy in 2017. Most recently, she suffered from a slip and fall yesterday from some water that was on the floor. Today her range of motion is limited, she is able to bare weight. She denies feeling a pop,discoloration of toes or decreased sensation. Today she is using a cane and knee brace for comfort. She has not used any OTC medications.  Denies calf pain, tenderness, or erythema.  Past Medical History:  Diagnosis Date  . Anxiety   . Arthritis    right knee and right elbow  . Cancer ([place])    Cervical CA with partial hysterectomy.  . Cognitive impairment, mild, so stated   . Depression   . Diverticulosis   . Eosinophilic esophagitis 123456   Biopsy Dec 2018  . Family history of breast cancer   . Gallstones   . GERD (gastroesophageal reflux disease)   . History of cervical cancer   . Hx of cervical cancer 01/30/2016  . Hypertension   . Hypothyroidism   . Sleep apnea    does not use a C-PAP  . Status post partial hysterectomy    Due to Cervical CA  . Vaginal inclusion cyst     Past Surgical History:  Procedure Laterality Date  . ABDOMINAL HYSTERECTOMY     partial hysterectomy  . BREAST BIOPSY Bilateral 01/01/2016   Abnormal screening mammogram, ultrasound-guided core biopsy. Fibroadenomas   . COLONOSCOPY WITH PROPOFOL N/A 06/27/2015   Procedure: COLONOSCOPY WITH PROPOFOL;  Surgeon: Josefine Class, MD;  Location: [facility] ENDOSCOPY;  Service: Endoscopy;  Laterality: N/A;  . COLONOSCOPY WITH PROPOFOL N/A 09/23/2017   Procedure: COLONOSCOPY WITH PROPOFOL;   Surgeon: Jonathon Bellows, MD;  Location: [facility] ENDOSCOPY;  Service: Gastroenterology;  Laterality: N/A;  . ESOPHAGOGASTRODUODENOSCOPY (EGD) WITH PROPOFOL N/A 07/22/2017   Procedure: ESOPHAGOGASTRODUODENOSCOPY (EGD) WITH PROPOFOL;  Surgeon: Jonathon Bellows, MD;  Location: [facility] ENDOSCOPY;  Service: Gastroenterology;  Laterality: N/A;  . ESOPHAGOGASTRODUODENOSCOPY (EGD) WITH PROPOFOL N/A 03/31/2018   Procedure: ESOPHAGOGASTRODUODENOSCOPY (EGD) WITH PROPOFOL;  Surgeon: Lin Landsman, MD;  Location: [facility] ENDOSCOPY;  Service: Gastroenterology;  Laterality: N/A;  . KNEE ARTHROSCOPY Right 07/22/2016   Procedure: ARTHROSCOPY KNEE debridement microfracture;  Surgeon: Leanor Kail, MD;  Location: [facility] ORS;  Service: Orthopedics;  Laterality: Right;  . KNEE ARTHROSCOPY WITH MEDIAL MENISECTOMY Left 11/10/2017   Procedure: KNEE ARTHROSCOPY WITH PARTIAL MEDIAL MENISECTOMY&patella femoral debridement, & ablation;  Surgeon: Leanor Kail, MD;  Location: [facility] ORS;  Service: Orthopedics;  Laterality: Left;  . TUBAL LIGATION      Family History  Problem Relation Age of Onset  . Diabetes Mother   . Breast cancer Maternal Grandmother   . Cancer Paternal Grandmother        cancer?  . Cirrhosis Cousin   . Breast cancer Other        x4    Social History   Socioeconomic History  . Marital status: Legally Separated    Spouse name: Jeneen Rinks  . Number of children: 2  . Years of education: Not  on file  . Highest education level: Not on file  Occupational History  . Occupation: un employed  Social Needs  . Financial resource strain: Somewhat hard  . Food insecurity    Worry: Never true    Inability: Never true  . Transportation needs    Medical: Yes    Non-medical: Yes  Tobacco Use  . Smoking status: Never Smoker  . Smokeless tobacco: Never Used  Substance and Sexual Activity  . Alcohol use: Yes    Comment: twice a week  . Drug use: No  . Sexual activity: Not Currently    Birth control/protection:  Surgical  Lifestyle  . Physical activity    Days per week: 0 days    Minutes per session: 0 min  . Stress: Not at all  Relationships  . Social Herbalist on phone: Once a week    Gets together: Once a week    Attends religious service: Never    Active member of club or organization: No    Attends meetings of clubs or organizations: Never    Relationship status: Separated  . Intimate partner violence    Fear of current or ex partner: No    Emotionally abused: No    Physically abused: No    Forced sexual activity: No  Other Topics Concern  . Not on file  Social History Narrative  . Not on file    Outpatient Medications Prior to Visit  Medication Sig Dispense Refill  . FLUoxetine (PROZAC) 20 MG capsule Take 20 mg by mouth daily.    Marland Kitchen levothyroxine (SYNTHROID, LEVOTHROID) 75 MCG tablet Take 1 tablet (75 mcg total) by mouth daily. 90 tablet 1  . meloxicam (MOBIC) 15 MG tablet Take 1 tablet (15 mg total) by mouth daily. 30 tablet 0  . cyclobenzaprine (FLEXERIL) 10 MG tablet Take 0.5 tablets (5 mg total) by mouth 3 (three) times daily as needed for muscle spasms. (Patient not taking: Reported on 08/01/2019) 30 tablet 0  . FLUoxetine (PROZAC) 20 MG tablet Take 1 tablet (20 mg total) by mouth daily. (Patient not taking: Reported on 08/01/2019) 90 tablet 1   No facility-administered medications prior to visit.     Allergies  Allergen Reactions  . Percocet [Oxycodone-Acetaminophen] Palpitations    Review of Systems  Constitutional: Negative.   Respiratory: Negative.   Cardiovascular: Negative.   Musculoskeletal: Positive for joint pain.  Skin: Negative.   Neurological: Negative.   Endo/Heme/Allergies: Negative.   Psychiatric/Behavioral: Negative.        Objective:    Physical Exam  Constitutional: She is oriented to person, place, and time. She appears well-developed. No distress.  HENT:  Head: Atraumatic.  Cardiovascular: Normal rate, regular rhythm and  intact distal pulses.  Pulmonary/Chest: Effort normal and breath sounds normal. No respiratory distress.  Musculoskeletal:     Right knee: She exhibits decreased range of motion and swelling. Tenderness found.       Legs:     Comments: All on the posterior knee - consistent with baker's cyst.  There is no calf pain, tenderness, erythema, or edema.  Neurological: She is alert and oriented to person, place, and time.  Skin: Skin is warm and dry.  Psychiatric: She has a normal mood and affect. Her behavior is normal.    BP 110/70 (BP Location: Right Arm, Patient Position: Sitting, Cuff Size: Large)   Pulse 73   Temp (!) 97.1 F (36.2 C) (Temporal)   Resp 16  Ht 5\' 1"  (1.549 m)   Wt 71.3 kg   LMP 05/09/1993 (Approximate)   SpO2 99%   BMI 29.68 kg/m  Wt Readings from Last 3 Encounters:  08/01/19 71.3 kg  05/30/19 72.6 kg  05/06/19 72.6 kg    Health Maintenance Due  Topic Date Due  . PAP SMEAR-Modifier  09/03/2019    There are no preventive care reminders to display for this patient.   Lab Results  Component Value Date   TSH 4.16 09/05/2018   Lab Results  Component Value Date   WBC 5.4 06/30/2019   HGB 12.8 06/30/2019   HCT 38.6 06/30/2019   MCV 87.1 06/30/2019   PLT 172 06/30/2019   Lab Results  Component Value Date   NA 137 06/30/2019   K 4.2 06/30/2019   CO2 30 06/30/2019   GLUCOSE 85 06/30/2019   BUN 13 06/30/2019   CREATININE 0.73 06/30/2019   BILITOT 0.2 06/30/2019   ALKPHOS 79 02/08/2019   AST 30 06/30/2019   ALT 26 06/30/2019   PROT 6.5 06/30/2019   ALBUMIN 3.7 02/08/2019   CALCIUM 9.0 06/30/2019   ANIONGAP 8 02/08/2019   Lab Results  Component Value Date   CHOL 186 09/05/2018   Lab Results  Component Value Date   HDL 46 (L) 09/05/2018   Lab Results  Component Value Date   LDLCALC 114 (H) 09/05/2018   Lab Results  Component Value Date   TRIG 144 09/05/2018   Lab Results  Component Value Date   CHOLHDL 4.0 09/05/2018   No results  found for: HGBA1C     Assessment & Plan:    1. Baker's cyst of knee, right - RICE for now, limit motion.  Needs urgent referral to ortho as the area is quite enlarged, unclear if ruptured at this point. - Ambulatory referral to Orthopedics  2. Acute pain of right knee - Ambulatory referral to [place], RN

## 2019-08-03 ENCOUNTER — Encounter: Payer: Self-pay | Admitting: Family Medicine

## 2019-08-03 DIAGNOSIS — M65861 Other synovitis and tenosynovitis, right lower leg: Secondary | ICD-10-CM | POA: Diagnosis not present

## 2019-08-16 DIAGNOSIS — Z7689 Persons encountering health services in other specified circumstances: Secondary | ICD-10-CM | POA: Diagnosis not present

## 2019-08-16 DIAGNOSIS — M170 Bilateral primary osteoarthritis of knee: Secondary | ICD-10-CM | POA: Diagnosis not present

## 2019-08-16 DIAGNOSIS — M1711 Unilateral primary osteoarthritis, right knee: Secondary | ICD-10-CM | POA: Diagnosis not present

## 2019-08-22 ENCOUNTER — Ambulatory Visit: Payer: Medicaid Other | Admitting: Gastroenterology

## 2019-09-11 ENCOUNTER — Encounter: Payer: Medicaid Other | Admitting: Family Medicine

## 2019-09-25 ENCOUNTER — Ambulatory Visit: Payer: Medicaid Other | Admitting: Urology

## 2019-10-02 ENCOUNTER — Ambulatory Visit: Payer: Self-pay | Admitting: Urology

## 2019-10-03 ENCOUNTER — Other Ambulatory Visit: Payer: Self-pay | Admitting: Family Medicine

## 2019-10-03 DIAGNOSIS — E039 Hypothyroidism, unspecified: Secondary | ICD-10-CM

## 2019-10-04 ENCOUNTER — Ambulatory Visit: Payer: Self-pay | Admitting: Urology

## 2019-10-17 ENCOUNTER — Encounter: Payer: Self-pay | Admitting: Intensive Care

## 2019-10-17 ENCOUNTER — Emergency Department
Admission: EM | Admit: 2019-10-17 | Discharge: 2019-10-17 | Disposition: A | Discharge status: Home / Self Care | Payer: Medicaid Other | Attending: Emergency Medicine | Admitting: Emergency Medicine

## 2019-10-17 ENCOUNTER — Other Ambulatory Visit: Payer: Self-pay

## 2019-10-17 DIAGNOSIS — Z79899 Other long term (current) drug therapy: Secondary | ICD-10-CM | POA: Insufficient documentation

## 2019-10-17 DIAGNOSIS — K644 Residual hemorrhoidal skin tags: Secondary | ICD-10-CM | POA: Diagnosis not present

## 2019-10-17 DIAGNOSIS — I10 Essential (primary) hypertension: Secondary | ICD-10-CM | POA: Diagnosis not present

## 2019-10-17 DIAGNOSIS — Z8541 Personal history of malignant neoplasm of cervix uteri: Secondary | ICD-10-CM | POA: Insufficient documentation

## 2019-10-17 DIAGNOSIS — K640 First degree hemorrhoids: Secondary | ICD-10-CM

## 2019-10-17 DIAGNOSIS — R58 Hemorrhage, not elsewhere classified: Secondary | ICD-10-CM | POA: Diagnosis not present

## 2019-10-17 DIAGNOSIS — K6289 Other specified diseases of anus and rectum: Secondary | ICD-10-CM | POA: Diagnosis not present

## 2019-10-17 DIAGNOSIS — E039 Hypothyroidism, unspecified: Secondary | ICD-10-CM | POA: Insufficient documentation

## 2019-10-17 DIAGNOSIS — K625 Hemorrhage of anus and rectum: Secondary | ICD-10-CM | POA: Diagnosis present

## 2019-10-17 MED ORDER — HYDROCORTISONE ACETATE 25 MG RE SUPP: 25.0000 mg | Freq: Two times a day (BID) | RECTAL | 0 refills | Status: DC

## 2019-10-17 NOTE — ED Triage Notes (Signed)
Pt comes into the ED via EMS from home with c/o bleeding hemorrhoids when she wipes with tissue.

## 2019-10-17 NOTE — ED Notes (Signed)
See triage note note  Presents with some blood in stool  States she noticed blood when wiping

## 2019-10-17 NOTE — ED Provider Notes (Signed)
[facility] Emergency Department Provider Note  ____________________________________________  Time seen: Approximately 1:47 PM  I have reviewed the triage vital signs and the nursing notes.   HISTORY  Chief Complaint Hemorrhoids    HPI NILAM EASTLING is a 54 y.o. female with PMH of hemorrhoids that presents to the emergency department for evaluation of a minimal amount of bright red blood on toilet paper this morning.  She is also having some pain right at her rectum.  Patient states that she has a history of bleeding hemorrhoids for the last year and has seen GI for this in the past.  She had hemorrhoids banded this summer.  She has not seen any bleeding since the hemorrhoids were banded.  Patient states that she has a long history of constipation and had a lot of straining with her bowel movement this morning just prior to seeing the blood.  She denies any fever, vomiting, abdominal pain, urinary symptoms, hematuria.   Past Medical History:  Diagnosis Date  . Anxiety   . Arthritis    right knee and right elbow  . Cancer ([place])    Cervical CA with partial hysterectomy.  . Cognitive impairment, mild, so stated   . Depression   . Diverticulosis   . Eosinophilic esophagitis 123456   Biopsy Dec 2018  . Family history of breast cancer   . Gallstones   . GERD (gastroesophageal reflux disease)   . History of cervical cancer   . Hx of cervical cancer 01/30/2016  . Hypertension   . Hypothyroidism   . Sleep apnea    does not use a C-PAP  . Status post partial hysterectomy    Due to Cervical CA  . Vaginal inclusion cyst     Patient Active Problem List   Diagnosis Date Noted  . Genetic testing 04/20/2019  . Family history of breast cancer   . History of cervical cancer   . Mastalgia 03/17/2019  . Family hx-breast malignancy 12/16/2018  . Esophageal dysphagia   . Essential hypertension 01/19/2018  . Obesity (BMI 30.0-34.9) 01/19/2018  . Preventative  health care 09/03/2017  . Eosinophilic esophagitis 123456  . Apophysitis 08/26/2017  . 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 Pain in joint, multiple sites 07/05/2017  . Osteoarthritis of knee 07/05/2017  . Lichen sclerosus et atrophicus 04/27/2017  . Headache 04/27/2017  . Headache disorder 02/17/2017  . Post-concussion headache 02/17/2017  . Medication monitoring encounter 09/21/2016  . Hematuria 04/23/2016  . Hx of cervical cancer 01/30/2016  . Menopause 01/30/2016  . Status post bilateral breast biopsy 01/07/2016  . Chronic nausea 10/25/2015  . Screening for STD (sexually transmitted disease) 10/25/2015  . Rectal bleeding 04/30/2015  . Right knee pain 07/23/2014  . Depression, major, recurrent, moderate ([place]) 02/02/2014  . Adult hypothyroidism 02/02/2014  . Obstructive apnea 02/02/2014  . Anxiety and depression 02/02/2014  . Incomplete bladder emptying 12/12/2012  . Tenosynovitis of foot 05/30/2012  . Benign neoplasm of kidney 05/13/2012  . Urge incontinence 05/13/2012  . FOM (frequency of micturition) 05/13/2012  . Chronic pain of right hand 05/05/2012  . Tarsal tunnel syndrome 03/03/2012    Past Surgical History:  Procedure Laterality Date  . ABDOMINAL HYSTERECTOMY     partial hysterectomy  . BREAST BIOPSY Bilateral 01/01/2016   Abnormal screening mammogram, ultrasound-guided core biopsy. Fibroadenomas   . COLONOSCOPY WITH PROPOFOL N/A 06/27/2015   Procedure: COLONOSCOPY WITH PROPOFOL;  Surgeon: Josefine Class, MD;  Location: [facility] ENDOSCOPY;  Service: Endoscopy;  Laterality:  N/A;  . COLONOSCOPY WITH PROPOFOL N/A 09/23/2017   Procedure: COLONOSCOPY WITH PROPOFOL;  Surgeon: Jonathon Bellows, MD;  Location[facility] ENDOSCOPY;  Service: Gastroenterology;  Laterality: N/A;  . ESOPHAGOGASTRODUODENOSCOPY (EGD) WITH PROPOFOL N/A 07/22/2017   Procedure: ESOPHAGOGASTRODUODENOSCOPY (EGD) WITH PROPOFOL;  Surgeon: Jonathon Bellows, MD;  Location: [facility] ENDOSCOPY;  Service:  Gastroenterology;  Laterality: N/A;  . ESOPHAGOGASTRODUODENOSCOPY (EGD) WITH PROPOFOL N/A 03/31/2018   Procedure: ESOPHAGOGASTRODUODENOSCOPY (EGD) WITH PROPOFOL;  Surgeon: Lin Landsman, MD;  Location: [facility] ENDOSCOPY;  Service: Gastroenterology;  Laterality: N/A;  . KNEE ARTHROSCOPY Right 07/22/2016   Procedure: ARTHROSCOPY KNEE debridement microfracture;  Surgeon: Leanor Kail, MD;  Location: [facility] ORS;  Service: Orthopedics;  Laterality: Right;  . KNEE ARTHROSCOPY WITH MEDIAL MENISECTOMY Left 11/10/2017   Procedure: KNEE ARTHROSCOPY WITH PARTIAL MEDIAL MENISECTOMY&patella femoral debridement, & ablation;  Surgeon: Leanor Kail, MD;  Location: [facility] ORS;  Service: Orthopedics;  Laterality: Left;  . TUBAL LIGATION      Prior to Admission medications   Medication Sig Start Date End Date Taking? Authorizing Provider  FLUoxetine (PROZAC) 20 MG capsule Take 20 mg by mouth daily. 03/20/19   [provider]  hydrocortisone (ANUSOL-HC) 25 MG suppository Place 1 suppository (25 mg total) rectally 2 (two) times daily. 10/17/19   Laban Emperor, PA-C  levothyroxine (SYNTHROID, LEVOTHROID) 75 MCG tablet Take 1 tablet (75 mcg total) by mouth daily. 09/05/18   [name], MD    Allergies Percocet [oxycodone-acetaminophen]  Family History  Problem Relation Age of Onset  . Diabetes Mother   . Breast cancer Maternal Grandmother   . Cancer Paternal Grandmother        cancer?  . Cirrhosis Cousin   . Breast cancer Other        x4    Social History Social History   Tobacco Use  . Smoking status: Never Smoker  . Smokeless tobacco: Never Used  Substance Use Topics  . Alcohol use: Yes    Comment: twice a week  . Drug use: Not Currently     Review of Systems  Constitutional: No fever/chills ENT: No upper respiratory complaints. Cardiovascular: No chest pain. Respiratory: No cough. No SOB. Gastrointestinal: No abdominal pain.  No nausea, no vomiting.  Genitourinary:  Negative for dysuria. Musculoskeletal: Negative for musculoskeletal pain. Skin: Negative for rash, abrasions, lacerations, ecchymosis. Neurological: Negative for headaches, numbness or tingling   ____________________________________________   PHYSICAL EXAM:  VITAL SIGNS: ED Triage Vitals  Enc Vitals Group     BP 10/17/19 1324 (!) 137/91     Pulse Rate 10/17/19 1324 98     Resp 10/17/19 1324 16     Temp 10/17/19 1324 99 F (37.2 C)     Temp Source 10/17/19 1324 Oral     SpO2 10/17/19 1324 99 %     Weight 10/17/19 1325 161 lb (73 kg)     Height 10/17/19 1325 5\' 1"  (1.549 m)     Head Circumference --      Peak Flow --      Pain Score 10/17/19 1325 10     Pain Loc --      Pain Edu? --      Excl. in [place]? --      Constitutional: Alert and oriented. Well appearing and in no acute distress. Eyes: Conjunctivae are normal. PERRL. EOMI. Head: Atraumatic. ENT:      Ears:      Nose: No congestion/rhinnorhea.      Mouth/Throat: Mucous membranes are moist.  Neck:  No stridor.   Cardiovascular: Normal rate, regular rhythm.  Good peripheral circulation. Respiratory: Normal respiratory effort without tachypnea or retractions. Lungs CTAB. Good air entry to the bases with no decreased or absent breath sounds. Gastrointestinal: Bowel sounds 4 quadrants. Soft and nontender to palpation. No guarding or rigidity. No palpable masses. No distention.  Rectal: Hemorroid with surface irritation and micro abrasions to 12 pm position. Musculoskeletal: Full range of motion to all extremities. No gross deformities appreciated. Neurologic:  Normal speech and language. No gross focal neurologic deficits are appreciated.  Skin:  Skin is warm, dry and intact. No rash noted. Psychiatric: Mood and affect are normal. Speech and behavior are normal. Patient exhibits appropriate insight and judgement.   ____________________________________________   LABS (all labs ordered are listed, but only abnormal  results are displayed)  Labs Reviewed - No data to display ____________________________________________  EKG   ____________________________________________  RADIOLOGY   No results found.  ____________________________________________    PROCEDURES  Procedure(s) performed:    Procedures    Medications - No data to display   ____________________________________________   INITIAL IMPRESSION / ASSESSMENT AND PLAN / ED COURSE  Pertinent labs & imaging results that were available during my care of the patient were reviewed by me and considered in my medical decision making (see chart for details).  Review of the [place] CSRS was performed in accordance of the [place] prior to dispensing any controlled drugs.   Patient's diagnosis is consistent with hemorrhoids.  Vital signs and exam are reassuring.  Visible hemorrhoid has some irritation to the surface with minimal blood.  This is likely due to patient's chronic constipation and straining.  No evidence of thrombosed hemorrhoid.  Patient denies any abdominal pain.  There is very minimal blood to the rag that patient has brought with her.  Patient will be discharged home with prescriptions for Anusol.  Education about sitz bath was provided.  Patient is to follow up with GI as directed. Patient is given ED precautions to return to the ED for any worsening or new symptoms.  ARDIE MAZZOTTI was evaluated in Emergency Department on 10/17/2019 for the symptoms described in the history of present illness. She was evaluated in the context of the global COVID-19 pandemic, which necessitated consideration that the patient might be at risk for infection with the SARS-CoV-2 virus that causes COVID-19. Institutional protocols and algorithms that pertain to the evaluation of patients at risk for COVID-19 are in a state of rapid change based on information released by regulatory bodies including the CDC and federal and state organizations. These policies and  algorithms were followed during the patient's care in the ED.   ____________________________________________  FINAL CLINICAL IMPRESSION(S) / ED DIAGNOSES  Final diagnoses:  Grade I hemorrhoids      NEW MEDICATIONS STARTED DURING THIS VISIT:  ED Discharge Orders         Ordered    hydrocortisone (ANUSOL-HC) 25 MG suppository  2 times daily     10/17/19 1410              This chart was dictated using voice recognition software/Dragon. Despite best efforts to proofread, errors can occur which can change the meaning. Any change was purely unintentional.    Laban Emperor, PA-C 10/17/19 1754    Earleen Newport, MD 10/18/19 [phone]

## 2019-10-17 NOTE — ED Triage Notes (Signed)
Patient c/o bleeding when she wipes with tissue since this AM due to hemorrhoids. HX same Pain in rectum

## 2019-10-21 ENCOUNTER — Emergency Department
Admission: EM | Admit: 2019-10-21 | Discharge: 2019-10-21 | Disposition: A | Discharge status: Home / Self Care | Payer: Medicaid Other | Attending: Emergency Medicine | Admitting: Emergency Medicine

## 2019-10-21 ENCOUNTER — Emergency Department: Payer: Medicaid Other

## 2019-10-21 ENCOUNTER — Other Ambulatory Visit: Payer: Self-pay

## 2019-10-21 ENCOUNTER — Encounter: Payer: Self-pay | Admitting: Emergency Medicine

## 2019-10-21 DIAGNOSIS — Y939 Activity, unspecified: Secondary | ICD-10-CM | POA: Insufficient documentation

## 2019-10-21 DIAGNOSIS — E039 Hypothyroidism, unspecified: Secondary | ICD-10-CM | POA: Insufficient documentation

## 2019-10-21 DIAGNOSIS — Y929 Unspecified place or not applicable: Secondary | ICD-10-CM | POA: Insufficient documentation

## 2019-10-21 DIAGNOSIS — I10 Essential (primary) hypertension: Secondary | ICD-10-CM | POA: Insufficient documentation

## 2019-10-21 DIAGNOSIS — M25521 Pain in right elbow: Secondary | ICD-10-CM | POA: Diagnosis not present

## 2019-10-21 DIAGNOSIS — Y999 Unspecified external cause status: Secondary | ICD-10-CM | POA: Diagnosis not present

## 2019-10-21 DIAGNOSIS — W19XXXA Unspecified fall, initial encounter: Secondary | ICD-10-CM | POA: Diagnosis not present

## 2019-10-21 DIAGNOSIS — Z79899 Other long term (current) drug therapy: Secondary | ICD-10-CM | POA: Diagnosis not present

## 2019-10-21 DIAGNOSIS — S59901A Unspecified injury of right elbow, initial encounter: Secondary | ICD-10-CM | POA: Diagnosis not present

## 2019-10-21 DIAGNOSIS — W1789XA Other fall from one level to another, initial encounter: Secondary | ICD-10-CM | POA: Diagnosis not present

## 2019-10-21 DIAGNOSIS — R52 Pain, unspecified: Secondary | ICD-10-CM | POA: Diagnosis not present

## 2019-10-21 MED ORDER — TRAMADOL HCL 50 MG PO TABS
50.0000 mg | ORAL_TABLET | Freq: Once | ORAL | Status: AC
  Administered 2019-10-21: 18:00:00 50 mg via ORAL
  Filled 2019-10-21: qty 1

## 2019-10-21 MED ORDER — TRAMADOL HCL 50 MG PO TABS: 50.0000 mg | ORAL_TABLET | Freq: Three times a day (TID) | ORAL | 0 refills | Status: DC | PRN

## 2019-10-21 NOTE — ED Provider Notes (Signed)
[facility] Emergency Department Provider Note ____________________________________________  Time seen: 1732  I have reviewed the triage vital signs and the nursing notes.  HISTORY  Chief Complaint  Arm Pain   HPI Kristina Reed is a 54 y.o. female presents to the ER today with c/o right elbow pain. She reports this started last night after falling off her boyfriend's lap. She is not sure if she put her hand down to catch herself or if she just landed on her right elbow. She describes the pain as sharp and stabbing. The pain radiates down her forearm but she denies swelling, bruising, abrasion, numbness or tingling. She has tried [facility] Powders and Lidocaine Patches with minimal relief. 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  Past Medical History:  Diagnosis Date  . Anxiety   . Arthritis    right knee and right elbow  . Cancer ([place])    Cervical CA with partial hysterectomy.  . Cognitive impairment, mild, so stated   . Depression   . Diverticulosis   . Eosinophilic esophagitis 123456   Biopsy Dec 2018  . Family history of breast cancer   . Gallstones   . GERD (gastroesophageal reflux disease)   . History of cervical cancer   . Hx of cervical cancer 01/30/2016  . Hypertension   . Hypothyroidism   . Sleep apnea    does not use a C-PAP  . Status post partial hysterectomy    Due to Cervical CA  . Vaginal inclusion cyst     Patient Active Problem List   Diagnosis Date Noted  . Genetic testing 04/20/2019  . Family history of breast cancer   . History of cervical cancer   . Mastalgia 03/17/2019  . Family hx-breast malignancy 12/16/2018  . Esophageal dysphagia   . Essential hypertension 01/19/2018  . Obesity (BMI 30.0-34.9) 01/19/2018  . Preventative health care 09/03/2017  . Eosinophilic esophagitis 123456  . Apophysitis 08/26/2017  . 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 Pain in joint, multiple sites 07/05/2017  . Osteoarthritis of knee  07/05/2017  . Lichen sclerosus et atrophicus 04/27/2017  . Headache 04/27/2017  . Headache disorder 02/17/2017  . Post-concussion headache 02/17/2017  . Medication monitoring encounter 09/21/2016  . Hematuria 04/23/2016  . Hx of cervical cancer 01/30/2016  . Menopause 01/30/2016  . Status post bilateral breast biopsy 01/07/2016  . Chronic nausea 10/25/2015  . Screening for STD (sexually transmitted disease) 10/25/2015  . Rectal bleeding 04/30/2015  . Right knee pain 07/23/2014  . Depression, major, recurrent, moderate ([place]) 02/02/2014  . Adult hypothyroidism 02/02/2014  . Obstructive apnea 02/02/2014  . Anxiety and depression 02/02/2014  . Incomplete bladder emptying 12/12/2012  . Tenosynovitis of foot 05/30/2012  . Benign neoplasm of kidney 05/13/2012  . Urge incontinence 05/13/2012  . FOM (frequency of micturition) 05/13/2012  . Chronic pain of right hand 05/05/2012  . Tarsal tunnel syndrome 03/03/2012    Past Surgical History:  Procedure Laterality Date  . ABDOMINAL HYSTERECTOMY     partial hysterectomy  . BREAST BIOPSY Bilateral 01/01/2016   Abnormal screening mammogram, ultrasound-guided core biopsy. Fibroadenomas   . COLONOSCOPY WITH PROPOFOL N/A 06/27/2015   Procedure: COLONOSCOPY WITH PROPOFOL;  Surgeon: Josefine Class, MD;  Location: [facility] ENDOSCOPY;  Service: Endoscopy;  Laterality: N/A;  . COLONOSCOPY WITH PROPOFOL N/A 09/23/2017   Procedure: COLONOSCOPY WITH PROPOFOL;  Surgeon: Jonathon Bellows, MD;  Location: [facility] ENDOSCOPY;  Service: Gastroenterology;  Laterality: N/A;  . ESOPHAGOGASTRODUODENOSCOPY (EGD) WITH PROPOFOL N/A  07/22/2017   Procedure: ESOPHAGOGASTRODUODENOSCOPY (EGD) WITH PROPOFOL;  Surgeon: Jonathon Bellows, MD;  Location: [facility] ENDOSCOPY;  Service: Gastroenterology;  Laterality: N/A;  . ESOPHAGOGASTRODUODENOSCOPY (EGD) WITH PROPOFOL N/A 03/31/2018   Procedure: ESOPHAGOGASTRODUODENOSCOPY (EGD) WITH PROPOFOL;  Surgeon: Lin Landsman, MD;  Location: [facility]  ENDOSCOPY;  Service: Gastroenterology;  Laterality: N/A;  . KNEE ARTHROSCOPY Right 07/22/2016   Procedure: ARTHROSCOPY KNEE debridement microfracture;  Surgeon: Leanor Kail, MD;  Location: [facility] ORS;  Service: Orthopedics;  Laterality: Right;  . KNEE ARTHROSCOPY WITH MEDIAL MENISECTOMY Left 11/10/2017   Procedure: KNEE ARTHROSCOPY WITH PARTIAL MEDIAL MENISECTOMY&patella femoral debridement, & ablation;  Surgeon: Leanor Kail, MD;  Location: [facility] ORS;  Service: Orthopedics;  Laterality: Left;  . TUBAL LIGATION      Prior to Admission medications   Medication Sig Start Date End Date Taking? Authorizing Provider  FLUoxetine (PROZAC) 20 MG capsule Take 20 mg by mouth daily. 03/20/19   [provider]  hydrocortisone (ANUSOL-HC) 25 MG suppository Place 1 suppository (25 mg total) rectally 2 (two) times daily. 10/17/19   Laban Emperor, PA-C  levothyroxine (SYNTHROID, LEVOTHROID) 75 MCG tablet Take 1 tablet (75 mcg total) by mouth daily. 09/05/18   [name], MD  traMADol (ULTRAM) 50 MG tablet Take 1 tablet (50 mg total) by mouth every 8 (eight) hours as needed. 10/21/19 10/20/20  Jearld Fenton, NP    Allergies Percocet [oxycodone-acetaminophen]  Family History  Problem Relation Age of Onset  . Diabetes Mother   . Breast cancer Maternal Grandmother   . Cancer Paternal Grandmother        cancer?  . Cirrhosis Cousin   . Breast cancer Other        x4    Social History Social History   Tobacco Use  . Smoking status: Never Smoker  . Smokeless tobacco: Never Used  Substance Use Topics  . Alcohol use: Yes    Comment: twice a week  . Drug use: Not Currently    Review of Systems  Constitutional: Negative for fever or chills. Cardiovascular: Negative for chest pain or chest tightness. Respiratory: Negative for cough or shortness of breath. Musculoskeletal: Positive for right elbow pain. Negative for right shoulder pain or right wrist pain. Skin: Negative for swelling,  bruising or abrasion. Neurological: Negative for tingling or numbness. ____________________________________________  PHYSICAL EXAM:  VITAL SIGNS: ED Triage Vitals  Enc Vitals Group     BP 10/21/19 1702 123/77     Pulse Rate 10/21/19 1702 85     Resp 10/21/19 1702 18     Temp 10/21/19 1702 98.7 F (37.1 C)     Temp Source 10/21/19 1702 Oral     SpO2 10/21/19 1702 99 %     Weight 10/21/19 1644 160 lb 15 oz (73 kg)     Height 10/21/19 1644 5\' 1"  (1.549 m)     Head Circumference --      Peak Flow --      Pain Score 10/21/19 1644 10     Pain Loc --      Pain Edu? --      Excl. in [place]? --     Constitutional: Alert and oriented. Well appearing and in no distress. Cardiovascular: Normal rate, regular rhythm. Radial pulses 2+ bilaterally Respiratory: Normal respiratory effort. No wheezes/rales/rhonchi. Musculoskeletal: Pain with flexion, extension and rotation of the right elbow but full ROM. Pain with palpation over the olecranon and proximal radius. No joint swelling noted. Normal flexion, extension and rotation of the  right wrist. Hand grips equal but painful on the right.  Neurologic:  Sensation intact to right upper extremity. Coordination of right hand normal.  Skin:  Skin is warm, dry and intact. No bruising or swelling noted. Psychiatric: Mood and affect are normal. Patient exhibits appropriate insight and judgment. ____________________________________________   RADIOLOGY   Imaging Orders     DG Elbow Complete Right  IMPRESSION:  No acute bony abnormality.   ____________________________________________    INITIAL IMPRESSION / ASSESSMENT AND PLAN / ED COURSE  Right Elbow Pain s/p Fall:  Xray right elbow negative for acute findings Tramadol 50 mg PO x 1 in ER RX for Tramadol 50 mg TID prn x 5 days for moderate pain Encouraged ice, Ibuprofen 600 mg TID prn with food.    I reviewed the patient's prescription history over the last 12 months in the multi-state  controlled substances database(s) that includes [place], [place], [place], [place] [place], [place], [place], [place], [place], [place], [place], [place], [place], [place], and [place].  Results were notable for no controlled substances in the last 6 months. ____________________________________________  FINAL CLINICAL IMPRESSION(S) / ED DIAGNOSES  Final diagnoses:  Right elbow pain  Fall, initial encounter      Jearld Fenton, NP 10/21/19 Posey Boyer, MD 10/21/19 [phone]

## 2019-10-21 NOTE — ED Triage Notes (Signed)
Pt to ER via EMS with c/o right elbow pain after a fall.  Pt arrives with homemade sling and lidoderm patch.

## 2019-10-21 NOTE — ED Notes (Signed)
Pt given paper copy of discharge instructions to sign.

## 2019-10-21 NOTE — ED Triage Notes (Signed)
First Nurse: Pt to ER via EMS from home reports elbow pain after falling off boyfriends lap last night.  NAD noted at this time.

## 2019-10-21 NOTE — Discharge Instructions (Addendum)
You were seen today for right elbow pain after a fall. The xray is negative for acute fracture. You have been given a RX for Tramadol to take every 8 hours as needed for moderate-severe pain. If pain is mild, you can apply ice and take Ibuprofen 600 mg every 8 hours as needed for food. You were given a sling for comfort, you may take this off at any time.

## 2019-10-21 NOTE — ED Notes (Signed)
Pt c/o 10/10 right elbow pain that started after a fall. Pt A&Ox4.

## 2019-10-24 ENCOUNTER — Other Ambulatory Visit: Payer: Self-pay

## 2019-10-24 ENCOUNTER — Ambulatory Visit (INDEPENDENT_AMBULATORY_CARE_PROVIDER_SITE_OTHER): Payer: Medicaid Other | Admitting: Gastroenterology

## 2019-10-24 ENCOUNTER — Encounter: Payer: Self-pay | Admitting: Gastroenterology

## 2019-10-24 VITALS — BP 127/89 | HR 86 | Temp 98.7°F | Wt 156.5 lb

## 2019-10-24 DIAGNOSIS — K5904 Chronic idiopathic constipation: Secondary | ICD-10-CM

## 2019-10-24 DIAGNOSIS — K640 First degree hemorrhoids: Secondary | ICD-10-CM

## 2019-10-24 DIAGNOSIS — K200 Eosinophilic esophagitis: Secondary | ICD-10-CM

## 2019-10-24 DIAGNOSIS — Z7689 Persons encountering health services in other specified circumstances: Secondary | ICD-10-CM | POA: Diagnosis not present

## 2019-10-24 NOTE — Patient Instructions (Signed)

## 2019-10-24 NOTE — Progress Notes (Signed)
Cephas Darby, MD [address]  [place]  [place], [place] [address]  Main: [phone]  Fax: [phone] Pager: [phone]   Primary Care Physician: Hubbard Hartshorn, FNP  Primary Gastroenterologist:  Dr. Vicente Males  Chief Complaint  Patient presents with  . Rectal Bleeding    Patient is having rectal bleeding in stools with bowel movements     HPI: Kristina Reed is a 54 y.o. female who regularly sees Dr. Vicente Males for eosinophilic esophagitis is referred to me for management of hemorrhoids. Patient reports that she underwent hemorrhoid banding by Dr [name] 3 years ago underwent 3 banding sessions and she did not think the procedure was helpful in alleviating her hemorrhoid symptoms. Her symptoms are predominantly itching, burning, swelling, pressure, rectal bleeding. She has history of chronic constipation and spends about 10-20 minutes time on toilet, due to significant straining/pushing. She consumes red meat, fried foods regularly. She has not tried any stool softener or fiber supplements.  Follow-up visit 01/19/2018 She tried linaclotide 156mcg which resulted in severe diarrhea and mild rectal bleeding which resolved. She no longer experiences diarrhea. Reports having one to 2 soft bowel movements daily, but associated with pushing. She denies any symptoms from hemorrhoids at this time. She is taking fiber supplements as recommended daily.  Follow-up visit 02/11/2018 She reports that her stools are very soft and resulted in incontinence on 2 separate occasions in last 2 days. She stopped all the fried foods. She is taking fiber supplements daily. She denies abdominal pain, nausea, vomiting, abdominal cramps, blood in stools. She denies taking any recent antibiotics, sick contacts or eating out. She is here for hemorrhoid banding today  Follow-up visit 03/25/2018 She reports recurrence of dysphagia to solids. She was treated with Flovent for 8 weeks in the past for his peptic  esophagitis. She is currently off PPI as well. She is concerned about intermittent episodes of painless rectal bleeding both on wiping and dripping. She denies constipation. She reports some rectal discomfort every time she has a bowel movement. She is here to undergo hemorrhoid ligation.  Follow-up visit 03/07/2019 She made a follow-up to see me due to episode of rectal bleeding that occurred yesterday.  She reports it was bright red blood, 1-2 teaspoonful.  She reports mild rectal discomfort.  She denies constipation. She underwent hemorrhoid ligation x2 in the past Patient underwent EGD for follow-up of eosinophilic esophagitis which revealed significant improvement in inflammation and eosinophilic count.  She was on Dexilant.  Currently, not on PPI.  She denies dysphagia  Follow-up visit 04/25/2019 Patient reports severe constipation.  She could not afford $3 co-pay for Linzess.  She tries prune juice.  She does report rectal bleeding on wiping  Follow-up visit 10/24/2019 Patient is here for follow-up of severe constipation as well as intermittent rectal bleeding.  Patient underwent ligation of internal hemorrhoids x4 so far.  She continues to have intermittent bright red blood per rectum on wiping and in the toilet bowl.  She recently went to ER and her hemoglobin is normal.  She does report ongoing constipation.  She cannot afford any prescription medications for constipation that I gave her so far.  She describes her stools as 1-2 on Bristol stool scale associated with significant straining.  She does consume white bread regularly   Current Outpatient Medications:  .  FLUoxetine (PROZAC) 20 MG capsule, Take 20 mg by mouth daily., Disp: , Rfl:  .  hydrocortisone (ANUSOL-HC) 25 MG suppository, Place  1 suppository (25 mg total) rectally 2 (two) times daily., Disp: 12 suppository, Rfl: 0 .  levothyroxine (SYNTHROID, LEVOTHROID) 75 MCG tablet, Take 1 tablet (75 mcg total) by mouth daily., Disp: 90  tablet, Rfl: 1 .  traMADol (ULTRAM) 50 MG tablet, Take 1 tablet (50 mg total) by mouth every 8 (eight) hours as needed. (Patient not taking: Reported on 10/24/2019), Disp: 20 tablet, Rfl: 0   Allergies as of 10/24/2019 - Review Complete 10/24/2019  Allergen Reaction Noted  . Percocet [oxycodone-acetaminophen] Palpitations 01/20/2015    NSAIDs: none  Antiplts/Anticoagulants/Anti thrombotics: none  GI procedures:  Upper endoscopy 07/22/2017 - Normal examined duodenum. - Gastritis. Biopsied. - Salmon-colored mucosa suspicious for Barrett's esophagus. Biopsied. - Normal mucosa was found in the entire esophagus. Biopsied. DIAGNOSIS:  A. STOMACH; COLD BIOPSY:  - ANTRAL MUCOSA WITH CHANGES CONSISTENT WITH HEALING MUCOSAL INJURY.  - NEGATIVE FOR H. PYLORI, DYSPLASIA, AND MALIGNANCY.   B. ESOPHAGUS, DISTAL; COLD BIOPSY:  - GASTROESOPHAGITIS.  - UP TO 40 INTRAEPITHELIAL EOSINOPHILS IN A HIGH-POWER FIELD WITH  EOSINOPHILIC MICROABSCESS FORMATION.  - PANCREATIC ACINAR CELL METAPLASIA.  - NEGATIVE FOR INTESTINAL METAPLASIA, DYSPLASIA, AND MALIGNANCY.   C. ESOPHAGUS; RANDOM BIOPSY:  - SQUAMOUS MUCOSA WITH AN INCREASE IN INTRAEPITHELIAL EOSINOPHILS  INVOLVING ALL BIOPSY FRAGMENTS, SEE COMMENT.  - UP TO 55 EOSINOPHILS IN A HIGH-POWER FIELD WITH EOSINOPHILIC  MICROABSCESS FORMATION.  - NEGATIVE FOR DYSPLASIA AND MALIGNANCY.   Colonoscopy on 09/2017 by Dr. Vicente Males - Non-bleeding internal hemorrhoids. - Diverticulosis in the sigmoid colon. - The examination was otherwise normal on direct and retroflexion views. - No specimens collected.  EGD 03/31/2018 - Normal duodenal bulb and second portion of the duodenum. - Normal stomach. - Esophagogastric landmarks identified. - Normal esophagus. Biopsied.  DIAGNOSIS:  A. DISTAL ESOPHAGUS; COLD BIOPSY:  - SQUAMOUS MUCOSA WITH UP TO 10 IN A HIGH-POWER FIELD INTRAEPITHELIAL  EOSINOPHILS.  - NEGATIVE FOR DYSPLASIA AND MALIGNANCY.   B. PROXIMAL  ESOPHAGUS; COLD BIOPSY:  - SQUAMOUS MUCOSA NEGATIVE FOR EOSINOPHILS, DYSPLASIA AND MALIGNANCY.   ROS:  General: Negative for anorexia, weight loss, fever, chills, fatigue, weakness. ENT: Negative for hoarseness, difficulty swallowing , nasal congestion. CV: Negative for chest pain, angina, palpitations, dyspnea on exertion, peripheral edema.  Respiratory: Negative for dyspnea at rest, dyspnea on exertion, cough, sputum, wheezing.  GI: See history of present illness. GU:  Negative for dysuria, hematuria, urinary incontinence, urinary frequency, nocturnal urination.  Endo: Negative for unusual weight change.    Physical Examination:   BP 127/89 (BP Location: Left Arm, Patient Position: Sitting, Cuff Size: Normal)   Pulse 86   Temp 98.7 F (37.1 C) (Oral)   Wt 156 lb 8 oz (71 kg)   LMP 05/09/1993 (Approximate)   BMI 29.57 kg/m   General: Well-nourished, well-developed in no acute distress.  Eyes: No icterus. Conjunctivae pink. Mouth: Oropharyngeal mucosa moist and pink , no lesions erythema or exudate. Lungs: Clear to auscultation bilaterally. Non-labored. Heart: Regular rate and rhythm, no murmurs rubs or gallops.  Abdomen: Bowel sounds are normal, nontender, nondistended, no hepatosplenomegaly or masses, no hernia , no rebound or guarding.   Rectal exam: Nontender, normal perianal exam, hypopigmented perianal skin Extremities: No lower extremity edema. No clubbing or deformities. Neuro: Alert and oriented x 3.  Grossly intact. Skin: Warm and dry, no jaundice.   Psych: Alert and cooperative, normal mood and affect.   Imaging Studies: DG Elbow Complete Right  Result Date: 10/21/2019 CLINICAL DATA:  Fall EXAM: RIGHT ELBOW - COMPLETE  3+ VIEW COMPARISON:  None. FINDINGS: No acute bony abnormality. Specifically, no fracture, subluxation, or dislocation. No joint effusion. Soft tissues intact. IMPRESSION: No acute bony abnormality. Electronically Signed   By: Rolm Baptise M.D.   On:  10/21/2019 17:54    Assessment and Plan:   Kristina Reed is a 54 y.o. female history of eosinophilic esophagitis treated with Flovent in the past, chronic constipation and intermittent rectal bleeding here for follow-up. Prior history of banding about 3 years ago with modest benefit. She now has recurrence of dysphagia  Dysphagia to solids: with history of eosinophilic Esophagitis EGD revealed improvement in eosinophilic count, responsive to PPI Currently asymptomatic - Currently not on PPI  Chronic constipation: Patient has tried different medications for constipation including linaclotide, Amitiza and MiraLAX Her diet is devoid of fiber It appears that patient does not have good insight into high-fiber foods although I attempted to educate her several times in the past Again, I have discussed with her about high-fiber diet, information provided  Symptomatic hemorrhoids with rectal bleeding: - I performed hemorrhoidal ligation x 4 in the past -Patient does not want hemorrhoid ligation and she wants to defer to surgery. I told her that she will have ongoing rectal bleeding if she continues to have hard bowel movements with significant straining and surgery would be helpful   Follow up in 1 month   Dr Sherri Sear, MD

## 2019-10-27 ENCOUNTER — Ambulatory Visit: Payer: Self-pay

## 2019-10-27 NOTE — Telephone Encounter (Signed)
Pt. Reports she fell last week, injuring her right elbow. Reports she was seen in ED "and they x-rayed it and said it wasn't broken, but it still hurts." Still has swelling, no bruising.Hurts to move elbow.Asking for an appointment next week so she has time to arrange transportation.No availability. Please advise pt.  Reason for Disposition . [1] After 2 weeks AND [2] still painful  Answer Assessment - Initial Assessment Questions 1. MECHANISM: "How did the injury happen?"     [address] off boyfriends lap 2. ONSET: "When did the injury happen?" (Minutes or hours ago)      Last Friday 3. LOCATION: "What part of the elbow is the injured?"      Right elbow 4. APPEARANCE of INJURY: "What does the injury look like?"      Swollen 5. SEVERITY: "Can you use the elbow normally?"  "Can you bend it and straighten it fully?"     Can move it 6. SIZE: For cuts, bruises, or swelling, ask: "How large is it?" (e.g., inches or centimeters; entire joint)      No 7. PAIN: "Is there pain?" If so, ask: "How bad is the pain?"    (Scale 1-10; or mild, moderate, severe)     10 8. TETANUS: For any breaks in the skin, ask: "When was the last tetanus booster?"     Unsure 9. OTHER SYMPTOMS: "Do you have any other symptoms?"  (e.g., numbness in hand)     No 10. PREGNANCY: "Is there any chance you are pregnant?" "When was your last menstrual period?"       No  Protocols used: ELBOW INJURY-A-AH

## 2019-10-27 NOTE — Telephone Encounter (Signed)
Called pt to schedule her with Dr Roxan Hockey today. Pt declined due to no transportation. Pt will call back next week if she still needs an appt.

## 2019-11-08 ENCOUNTER — Other Ambulatory Visit: Payer: Self-pay | Admitting: Family Medicine

## 2019-11-08 DIAGNOSIS — E039 Hypothyroidism, unspecified: Secondary | ICD-10-CM

## 2019-11-08 NOTE — Telephone Encounter (Signed)
Requested medication (s) are due for refill today:   Yes   Requested medication (s) are on the active medication list:   Yes  Future visit scheduled:   No   Last ordered: 09/05/2018  #90  1 refill  Clinic note:  Last TSH done 09/05/2018.     Requested Prescriptions  Pending Prescriptions Disp Refills   levothyroxine (SYNTHROID) 75 MCG tablet [Pharmacy Med Name: levothyroxine 75 mcg tablet] 90 tablet 1    Sig: TAKE ONE TABLET BY MOUTH EVERY MORNING      Endocrinology:  Hypothyroid Agents Failed - 11/08/2019 11:15 AM      Failed - TSH needs to be rechecked within 3 months after an abnormal result. Refill until TSH is due.      Failed - TSH in normal range and within 360 days    TSH  Date Value Ref Range Status  09/05/2018 4.16 mIU/L Final    Comment:              Reference Range .           > or = 20 Years  0.40-4.50 .                Pregnancy Ranges           First trimester    0.26-2.66           Second trimester   0.55-2.73           Third trimester    0.43-2.91           Passed - Valid encounter within last 12 months    Recent Outpatient Visits           3 months ago Baker's cyst of knee, right   [place], [place]   4 months ago Frequent urinary incontinence   [place], FNP   8 months ago Breast pain in female   [place], FNP   10 months ago Bowel and bladder incontinence   [place], Satira Anis, MD   10 months ago Family hx-breast malignancy   [place], Satira Anis, MD       Future Appointments             Tomorrow Diamantina Providence, Herbert Seta, MD [place]   In 2 days Towanda Malkin, MD [facility] [place], [place]   In 6 days Vanga, Tally Due, MD [place]

## 2019-11-09 ENCOUNTER — Encounter: Payer: Self-pay | Admitting: Urology

## 2019-11-09 ENCOUNTER — Other Ambulatory Visit: Payer: Self-pay

## 2019-11-09 ENCOUNTER — Ambulatory Visit (INDEPENDENT_AMBULATORY_CARE_PROVIDER_SITE_OTHER): Payer: Medicaid Other | Admitting: Urology

## 2019-11-09 VITALS — BP 123/85 | HR 80 | Ht 61.0 in | Wt 154.4 lb

## 2019-11-09 DIAGNOSIS — Z7689 Persons encountering health services in other specified circumstances: Secondary | ICD-10-CM | POA: Diagnosis not present

## 2019-11-09 DIAGNOSIS — Z87898 Personal history of other specified conditions: Secondary | ICD-10-CM

## 2019-11-09 LAB — BLADDER SCAN AMB NON-IMAGING

## 2019-11-09 NOTE — Patient Instructions (Signed)

## 2019-11-09 NOTE — Progress Notes (Signed)
   11/09/2019 9:33 AM   Kristina Reed [birth year] [id number]  Reason for visit: Follow up history of urinary retention  HPI: I saw Kristina Reed back in urology clinic for follow-up of urinary retention.  I last saw her in January 2020.  She is a 54 year old female with history notable for anxiety, depression, and cervical cancer and partial hysterectomy.  She reportedly has had around 5 episodes of acute urinary retention of unclear etiology over the last 30 years that required catheter placement.  Her last episode was December 2019 when she presented to the ER with pelvic pain and inability to void.  Her bladder scan was only 100 mL at that time, however Foley catheter was placed.  She then followed up in the ED the next day for Foley removal as she was having discomfort from the catheter.  She does not have a history of any gross hematuria or flank pain.  At our last visit, her PVR was 13 mL, and we taught her CIC and gave her some catheters in case she developed acute retention again.  She denies any problems over the last year with inability to urinate or needing to catheterize.  She has some mild urgency and urge incontinence occasionally at baseline if she is washing dishes or drinking lots of coffee or soda.  She denies any significant nocturia.  We reviewed her symptoms at length and her unclear etiology of prior past possible episodes of retention.  The good news is she is been doing well over the last year with no episodes of retention or needing to catheterize.  She has mild urgency and occasional urge incontinence that I do not think warrants medications at this time, and we discussed the side effects of anticholinergics, and the cost of Myrbetriq.  She is in agreement.  We discussed behavioral strategies including timed voiding, minimizing bladder irritants, and double voiding before bed.  RTC 1 year with PA  I spent 20 total minutes on the day of the encounter including pre-visit review of  the medical record, face-to-face time with the patient, and post visit ordering of labs/imaging/tests.  Kristina Reed, [facility] [address], [place], [place] [address] [phone]

## 2019-11-10 ENCOUNTER — Ambulatory Visit: Payer: Medicaid Other | Admitting: Internal Medicine

## 2019-11-14 ENCOUNTER — Encounter: Payer: Self-pay | Admitting: Gastroenterology

## 2019-11-14 ENCOUNTER — Ambulatory Visit: Payer: Medicaid Other | Admitting: Gastroenterology

## 2019-11-14 ENCOUNTER — Other Ambulatory Visit: Payer: Self-pay

## 2019-11-14 VITALS — BP 146/113 | HR 77 | Temp 98.4°F | Wt 156.1 lb

## 2019-11-14 DIAGNOSIS — Z7689 Persons encountering health services in other specified circumstances: Secondary | ICD-10-CM | POA: Diagnosis not present

## 2019-11-14 DIAGNOSIS — K640 First degree hemorrhoids: Secondary | ICD-10-CM | POA: Diagnosis not present

## 2019-11-14 NOTE — Progress Notes (Signed)
Cephas Darby, MD [address]  [place]  [place], [place] [address]  Main: [phone]  Fax: [phone] Pager: [phone]   Primary Care Physician: Hubbard Hartshorn, FNP  Primary Gastroenterologist:  Dr. Vicente Males  Chief Complaint  Patient presents with  . Rectal Bleeding    with every bowel movements   . Constipation    going every other day. Patient states that they hard stool     HPI: Kristina Reed is a 54 y.o. female who regularly sees Dr. Vicente Males for eosinophilic esophagitis is referred to me for management of hemorrhoids. Patient reports that she underwent hemorrhoid banding by Dr [name] 3 years ago underwent 3 banding sessions and she did not think the procedure was helpful in alleviating her hemorrhoid symptoms. Her symptoms are predominantly itching, burning, swelling, pressure, rectal bleeding. She has history of chronic constipation and spends about 10-20 minutes time on toilet, due to significant straining/pushing. She consumes red meat, fried foods regularly. She has not tried any stool softener or fiber supplements.  Follow-up visit 01/19/2018 She tried linaclotide 125mcg which resulted in severe diarrhea and mild rectal bleeding which resolved. She no longer experiences diarrhea. Reports having one to 2 soft bowel movements daily, but associated with pushing. She denies any symptoms from hemorrhoids at this time. She is taking fiber supplements as recommended daily.  Follow-up visit 02/11/2018 She reports that her stools are very soft and resulted in incontinence on 2 separate occasions in last 2 days. She stopped all the fried foods. She is taking fiber supplements daily. She denies abdominal pain, nausea, vomiting, abdominal cramps, blood in stools. She denies taking any recent antibiotics, sick contacts or eating out. She is here for hemorrhoid banding today  Follow-up visit 03/25/2018 She reports recurrence of dysphagia to solids. She was treated with Flovent  for 8 weeks in the past for his peptic esophagitis. She is currently off PPI as well. She is concerned about intermittent episodes of painless rectal bleeding both on wiping and dripping. She denies constipation. She reports some rectal discomfort every time she has a bowel movement. She is here to undergo hemorrhoid ligation.  Follow-up visit 03/07/2019 She made a follow-up to see me due to episode of rectal bleeding that occurred yesterday.  She reports it was bright red blood, 1-2 teaspoonful.  She reports mild rectal discomfort.  She denies constipation. She underwent hemorrhoid ligation x2 in the past Patient underwent EGD for follow-up of eosinophilic esophagitis which revealed significant improvement in inflammation and eosinophilic count.  She was on Dexilant.  Currently, not on PPI.  She denies dysphagia  Follow-up visit 04/25/2019 Patient reports severe constipation.  She could not afford $3 co-pay for Linzess.  She tries prune juice.  She does report rectal bleeding on wiping  Follow-up visit 10/24/2019 Patient is here for follow-up of severe constipation as well as intermittent rectal bleeding.  Patient underwent ligation of internal hemorrhoids x4 so far.  She continues to have intermittent bright red blood per rectum on wiping and in the toilet bowl.  She recently went to ER and her hemoglobin is normal.  She does report ongoing constipation.  She cannot afford any prescription medications for constipation that I gave her so far.  She describes her stools as 1-2 on Bristol stool scale associated with significant straining.  She does consume white bread regularly  Follow-up visit 3-21 Patient is here to discuss about ongoing rectal bleeding-she is interested to be referred to general  surgery.  She reports taking Linzess regularly, however feels constipated   Current Outpatient Medications:  .  FLUoxetine (PROZAC) 20 MG capsule, Take 20 mg by mouth daily., Disp: , Rfl:  .  hydrocortisone  (ANUSOL-HC) 25 MG suppository, Place 1 suppository (25 mg total) rectally 2 (two) times daily., Disp: 12 suppository, Rfl: 0 .  levothyroxine (SYNTHROID) 75 MCG tablet, TAKE ONE TABLET BY MOUTH EVERY MORNING, Disp: 90 tablet, Rfl: 1 .  traMADol (ULTRAM) 50 MG tablet, Take 1 tablet (50 mg total) by mouth every 8 (eight) hours as needed., Disp: 20 tablet, Rfl: 0   Allergies as of 11/14/2019 - Review Complete 11/14/2019  Allergen Reaction Noted  . Percocet [oxycodone-acetaminophen] Palpitations 01/20/2015    NSAIDs: none  Antiplts/Anticoagulants/Anti thrombotics: none  GI procedures:  Upper endoscopy 07/22/2017 - Normal examined duodenum. - Gastritis. Biopsied. - Salmon-colored mucosa suspicious for Barrett's esophagus. Biopsied. - Normal mucosa was found in the entire esophagus. Biopsied. DIAGNOSIS:  A. STOMACH; COLD BIOPSY:  - ANTRAL MUCOSA WITH CHANGES CONSISTENT WITH HEALING MUCOSAL INJURY.  - NEGATIVE FOR H. PYLORI, DYSPLASIA, AND MALIGNANCY.   B. ESOPHAGUS, DISTAL; COLD BIOPSY:  - GASTROESOPHAGITIS.  - UP TO 40 INTRAEPITHELIAL EOSINOPHILS IN A HIGH-POWER FIELD WITH  EOSINOPHILIC MICROABSCESS FORMATION.  - PANCREATIC ACINAR CELL METAPLASIA.  - NEGATIVE FOR INTESTINAL METAPLASIA, DYSPLASIA, AND MALIGNANCY.   C. ESOPHAGUS; RANDOM BIOPSY:  - SQUAMOUS MUCOSA WITH AN INCREASE IN INTRAEPITHELIAL EOSINOPHILS  INVOLVING ALL BIOPSY FRAGMENTS, SEE COMMENT.  - UP TO 55 EOSINOPHILS IN A HIGH-POWER FIELD WITH EOSINOPHILIC  MICROABSCESS FORMATION.  - NEGATIVE FOR DYSPLASIA AND MALIGNANCY.   Colonoscopy on 09/2017 by Dr. Vicente Males - Non-bleeding internal hemorrhoids. - Diverticulosis in the sigmoid colon. - The examination was otherwise normal on direct and retroflexion views. - No specimens collected.  EGD 03/31/2018 - Normal duodenal bulb and second portion of the duodenum. - Normal stomach. - Esophagogastric landmarks identified. - Normal esophagus. Biopsied.  DIAGNOSIS:  A.  DISTAL ESOPHAGUS; COLD BIOPSY:  - SQUAMOUS MUCOSA WITH UP TO 10 IN A HIGH-POWER FIELD INTRAEPITHELIAL  EOSINOPHILS.  - NEGATIVE FOR DYSPLASIA AND MALIGNANCY.   B. PROXIMAL ESOPHAGUS; COLD BIOPSY:  - SQUAMOUS MUCOSA NEGATIVE FOR EOSINOPHILS, DYSPLASIA AND MALIGNANCY.   ROS:  General: Negative for anorexia, weight loss, fever, chills, fatigue, weakness. ENT: Negative for hoarseness, difficulty swallowing , nasal congestion. CV: Negative for chest pain, angina, palpitations, dyspnea on exertion, peripheral edema.  Respiratory: Negative for dyspnea at rest, dyspnea on exertion, cough, sputum, wheezing.  GI: See history of present illness. GU:  Negative for dysuria, hematuria, urinary incontinence, urinary frequency, nocturnal urination.  Endo: Negative for unusual weight change.    Physical Examination:   BP (!) 146/113 (BP Location: Left Arm, Patient Position: Sitting, Cuff Size: Normal)   Pulse 77   Temp 98.4 F (36.9 C) (Oral)   Wt 156 lb 2 oz (70.8 kg)   LMP 05/09/1993 (Approximate)   BMI 29.50 kg/m   General: Well-nourished, well-developed in no acute distress.  Eyes: No icterus. Conjunctivae pink. Mouth: Oropharyngeal mucosa moist and pink , no lesions erythema or exudate. Lungs: Clear to auscultation bilaterally. Non-labored. Heart: Regular rate and rhythm, no murmurs rubs or gallops.  Abdomen: Bowel sounds are normal, nontender, nondistended, no hepatosplenomegaly or masses, no hernia , no rebound or guarding.   Rectal exam: Nontender, normal perianal exam, hypopigmented perianal skin Extremities: No lower extremity edema. No clubbing or deformities. Neuro: Alert and oriented x 3.  Grossly intact. Skin: Warm and dry,  no jaundice.   Psych: Alert and cooperative, normal mood and affect.   Imaging Studies: DG Elbow Complete Right  Result Date: 10/21/2019 CLINICAL DATA:  Fall EXAM: RIGHT ELBOW - COMPLETE 3+ VIEW COMPARISON:  None. FINDINGS: No acute bony abnormality.  Specifically, no fracture, subluxation, or dislocation. No joint effusion. Soft tissues intact. IMPRESSION: No acute bony abnormality. Electronically Signed   By: Rolm Baptise M.D.   On: 10/21/2019 17:54    Assessment and Plan:   Kristina Reed is a 54 y.o. female history of eosinophilic esophagitis treated with Flovent in the past, chronic constipation and intermittent rectal bleeding here for follow-up. Prior history of banding about 3 years ago with modest benefit. She now has recurrence of dysphagia  Dysphagia to solids: with history of eosinophilic Esophagitis EGD revealed improvement in eosinophilic count, responsive to PPI Currently asymptomatic - Currently not on PPI  Chronic constipation: Patient has tried different medications for constipation including linaclotide, Amitiza and MiraLAX Her diet is devoid of fiber Again, I have discussed with her about high-fiber diet, information provided We will try Linzess 290 MCG daily, samples provided  Symptomatic hemorrhoids with rectal bleeding: - I performed hemorrhoidal ligation x 4 in the past -Patient does not want hemorrhoid ligation and she wants to undergo hemorrhoidectomy, will refer her to general surgery.  Follow up as needed   Dr Sherri Sear, MD

## 2019-11-16 ENCOUNTER — Ambulatory Visit (INDEPENDENT_AMBULATORY_CARE_PROVIDER_SITE_OTHER): Payer: Medicaid Other | Admitting: General Surgery

## 2019-11-16 ENCOUNTER — Encounter: Payer: Self-pay | Admitting: General Surgery

## 2019-11-16 ENCOUNTER — Other Ambulatory Visit: Payer: Self-pay

## 2019-11-16 VITALS — BP 128/88 | HR 71 | Temp 97.5°F | Resp 13 | Ht 61.0 in | Wt 156.0 lb

## 2019-11-16 DIAGNOSIS — K640 First degree hemorrhoids: Secondary | ICD-10-CM | POA: Diagnosis not present

## 2019-11-16 DIAGNOSIS — Z7689 Persons encountering health services in other specified circumstances: Secondary | ICD-10-CM | POA: Diagnosis not present

## 2019-11-16 NOTE — Progress Notes (Signed)
Patient ID: Kristina Reed, female   DOB: 1966/05/02, 54 y.o.   MRN: [id number]  Chief Complaint  Patient presents with  . Hemorrhoids    HPI [name] is a 54 y.o. female.  I have seen her in the past for mastalgia, however she is here today for completely unrelated issue.  She was referred by Dr. Marius Ditch for internal hemorrhoids.  I have reviewed Dr. Verlin Grills clinic notes and have copied the most recent summary of data here:  "Kristina Reed is a 54 y.o. female who regularly sees Dr. Vicente Males for eosinophilic esophagitis is referred to me for management of hemorrhoids. Patient reports that she underwent hemorrhoid banding by Dr [name] 3 years ago underwent 3 banding sessions and she did not think the procedure was helpful in alleviating her hemorrhoid symptoms. Her symptoms are predominantly itching, burning, swelling, pressure, rectal bleeding. She has history of chronic constipation and spends about 10-20 minutes time on toilet, due to significant straining/pushing. She consumes red meat, fried foods regularly. She has not tried any stool softener or fiber supplements.  Follow-up visit 01/19/2018 She tried linaclotide 151mcg which resulted in severe diarrhea and mild rectal bleeding which resolved. She no longer experiences diarrhea. Reports having one to 2 soft bowel movements daily, but associated with pushing. She denies any symptoms from hemorrhoids at this time. She is taking fiber supplements as recommended daily.  Follow-up visit 02/11/2018 She reports that her stools are very soft and resulted in incontinence on 2 separate occasions in last 2 days. She stopped all the fried foods. She is taking fiber supplements daily. She denies abdominal pain, nausea, vomiting, abdominal cramps, blood in stools. She denies taking any recent antibiotics, sick contacts or eating out. She is here for hemorrhoid banding today  Follow-up visit 03/25/2018 She reports recurrence of dysphagia to solids. She was  treated with Flovent for 8 weeks in the past for his peptic esophagitis. She is currently off PPI as well. She is concerned about intermittent episodes of painless rectal bleeding both on wiping and dripping. She denies constipation. She reports some rectal discomfort every time she has a bowel movement. She is here to undergo hemorrhoid ligation.  Follow-up visit 03/07/2019 She made a follow-up to see me due to episode of rectal bleeding that occurred yesterday.  She reports it was bright red blood, 1-2 teaspoonful.  She reports mild rectal discomfort.  She denies constipation. She underwent hemorrhoid ligation x2 in the past Patient underwent EGD for follow-up of eosinophilic esophagitis which revealed significant improvement in inflammation and eosinophilic count.  She was on Dexilant.  Currently, not on PPI.  She denies dysphagia  Follow-up visit 04/25/2019 Patient reports severe constipation.  She could not afford $3 co-pay for Linzess.  She tries prune juice.  She does report rectal bleeding on wiping  Follow-up visit 10/24/2019 Patient is here for follow-up of severe constipation as well as intermittent rectal bleeding.  Patient underwent ligation of internal hemorrhoids x4 so far.  She continues to have intermittent bright red blood per rectum on wiping and in the toilet bowl.  She recently went to ER and her hemoglobin is normal.  She does report ongoing constipation.  She cannot afford any prescription medications for constipation that I gave her so far.  She describes her stools as 1-2 on Bristol stool scale associated with significant straining.  She does consume white bread regularly  Follow-up visit 3-21 Patient is here to discuss about ongoing rectal bleeding-she is interested  to be referred to general surgery.  She reports taking Linzess regularly, however feels constipated."  [name] states that she has had off and on bleeding for the past couple of months.  This predominantly occurs  with bowel movements.  She says that she feels like the hemorrhoidal banding has not been.  She says that she has a bowel movement about every other day.  She describes her stool as "hard, like nuts"' and spends about 20 minutes on the toilet attempting to have a bowel movement.  It is difficult to ascertain from prior exactly what she has been doing to try and alleviate her constipation.  It does not sound like she is actually taking the prescribed Linzess.  She says she cannot swallow the "dry crunchy fiber supplements."  She has not tried any of the other options for fiber supplementation, nor is she taking stool softener or other treatment for constipation.  Her diet does not seem to have much insoluble fiber in it, either.  She currently denies any pain, burning, or itching in the perianal area.    Past Medical History:  Diagnosis Date  . Anxiety   . Arthritis    right knee and right elbow  . Cancer ([place])    Cervical CA with partial hysterectomy.  . Cognitive impairment, mild, so stated   . Depression   . Diverticulosis   . Eosinophilic esophagitis [id number]   Biopsy Dec 2018  . Esophageal dysphagia   . Family history of breast cancer   . Gallstones   . GERD (gastroesophageal reflux disease)   . History of cervical cancer   . Hx of cervical cancer 01/30/2016  . Hypertension   . Hypothyroidism   . Sleep apnea    does not use a C-PAP  . Status post partial hysterectomy    Due to Cervical CA  . Vaginal inclusion cyst     Past Surgical History:  Procedure Laterality Date  . ABDOMINAL HYSTERECTOMY     partial hysterectomy  . BREAST BIOPSY Bilateral 01/01/2016   Abnormal screening mammogram, ultrasound-guided core biopsy. Fibroadenomas   . COLONOSCOPY WITH PROPOFOL N/A 06/27/2015   Procedure: COLONOSCOPY WITH PROPOFOL;  Surgeon: Josefine Class, MD;  Location: [facility] ENDOSCOPY;  Service: Endoscopy;  Laterality: N/A;  . COLONOSCOPY WITH PROPOFOL N/A 09/23/2017   Procedure:  COLONOSCOPY WITH PROPOFOL;  Surgeon: Jonathon Bellows, MD;  Location: [facility] ENDOSCOPY;  Service: Gastroenterology;  Laterality: N/A;  . ESOPHAGOGASTRODUODENOSCOPY (EGD) WITH PROPOFOL N/A 07/22/2017   Procedure: ESOPHAGOGASTRODUODENOSCOPY (EGD) WITH PROPOFOL;  Surgeon: Jonathon Bellows, MD;  Location: [facility] ENDOSCOPY;  Service: Gastroenterology;  Laterality: N/A;  . ESOPHAGOGASTRODUODENOSCOPY (EGD) WITH PROPOFOL N/A 03/31/2018   Procedure: ESOPHAGOGASTRODUODENOSCOPY (EGD) WITH PROPOFOL;  Surgeon: Lin Landsman, MD;  Location: [facility] ENDOSCOPY;  Service: Gastroenterology;  Laterality: N/A;  . KNEE ARTHROSCOPY Right 07/22/2016   Procedure: ARTHROSCOPY KNEE debridement microfracture;  Surgeon: Leanor Kail, MD;  Location: [facility] ORS;  Service: Orthopedics;  Laterality: Right;  . KNEE ARTHROSCOPY WITH MEDIAL MENISECTOMY Left 11/10/2017   Procedure: KNEE ARTHROSCOPY WITH PARTIAL MEDIAL MENISECTOMY&patella femoral debridement, & ablation;  Surgeon: Leanor Kail, MD;  Location: [facility] ORS;  Service: Orthopedics;  Laterality: Left;  . TUBAL LIGATION      Family History  Problem Relation Age of Onset  . Diabetes Mother   . Breast cancer Maternal Grandmother   . Cancer Paternal Grandmother        cancer?  . Cirrhosis Cousin   . Breast cancer Other  x4    Social History Social History   Tobacco Use  . Smoking status: Never Smoker  . Smokeless tobacco: Never Used  Substance Use Topics  . Alcohol use: Yes    Comment: twice a week  . Drug use: Not Currently    Allergies  Allergen Reactions  . Percocet [Oxycodone-Acetaminophen] Palpitations    Current Outpatient Medications  Medication Sig Dispense Refill  . FLUoxetine (PROZAC) 20 MG capsule Take 20 mg by mouth daily.    . hydrocortisone (ANUSOL-HC) 25 MG suppository Place 1 suppository (25 mg total) rectally 2 (two) times daily. 12 suppository 0  . levothyroxine (SYNTHROID) 75 MCG tablet TAKE ONE TABLET BY MOUTH EVERY MORNING 90 tablet 1   . traMADol (ULTRAM) 50 MG tablet Take 1 tablet (50 mg total) by mouth every 8 (eight) hours as needed. 20 tablet 0   No current facility-administered medications for this visit.    Review of Systems Review of Systems  HENT: Positive for trouble swallowing.   Gastrointestinal: Positive for blood in stool and constipation.  All other systems reviewed and are negative.   Blood pressure 128/88, pulse 71, temperature (!) 97.5 F (36.4 C), resp. rate 13, height 5\' 1"  (1.549 m), weight 156 lb (70.8 kg), last menstrual period 05/09/1993, SpO2 98 %. Body mass index is 29.48 kg/m.  Physical Exam Physical Exam Vitals reviewed. Exam conducted with a chaperone present.  Constitutional:      General: She is not in acute distress.    Appearance: Normal appearance.  HENT:     Head: Normocephalic and atraumatic.     Nose:     Comments: Covered with a mask secondary to COVID-19 precautions    Mouth/Throat:     Comments: Covered with a mask secondary to COVID-19 precautions Eyes:     General:        Right eye: No discharge.        Left eye: No discharge.     Comments: Wearing glasses  Cardiovascular:     Rate and Rhythm: Normal rate and regular rhythm.  Pulmonary:     Effort: Pulmonary effort is normal. No respiratory distress.  Abdominal:     General: Abdomen is flat.     Palpations: Abdomen is soft.  Genitourinary:    Comments: Digital rectal exam demonstrated no gross blood.  No masses palpated no stool in the vault.  No external hemorrhoids appreciated. Musculoskeletal:        General: No swelling or deformity.     Cervical back: No rigidity.  Lymphadenopathy:     Cervical: No cervical adenopathy.  Skin:    General: Skin is warm and dry.  Neurological:     Mental Status: She is alert. Mental status is at baseline.  Psychiatric:        Mood and Affect: Mood normal.        Behavior: Behavior normal.     Data Reviewed I reviewed the colonoscopy performed in January 2018.   Pertinent to today's visit is the presence of grade 1, nonbleeding internal hemorrhoids.  As above, I reviewed Dr. Verlin Grills notes, specifically, the rectal exam in each that does not describe any external hemorrhoids.  Assessment This is a 54 year old woman who has severe chronic constipation.  I am not sure, based on our discussion, how compliant she has been with recommended interventions.  I think this is the primary issue, rather than the presence of the small internal hemorrhoids.  I would not recommend surgery at this  point, but rather focus on treating her constipation.  Plan I recommended that she take MiraLAX 17 g nightly and use over-the-counter Benefiber in the mornings.  She should titrate the Benefiber upwards to achieve a daily soft bowel movement that does not require straining to eliminate.  She may increase the MiraLAX to twice a day.  She should continue to follow-up with Dr. Marius Ditch.  She can see [name] on an as-needed basis.    Fredirick Maudlin 11/16/2019, 2:42 PM

## 2019-11-16 NOTE — Patient Instructions (Addendum)
-You can try fiber gummies daily, these are easy to chew.                                                   -Start using Benefiber, 2 teaspoons in a liquid in the morning.                                                                                  -Miralax 1 capful(17 grams) in a full glass of liquid daily at night.   You will also want to increase your water intake.                                                                     Follow-up with our office as needed.  Please call and ask to speak with a nurse if you develop questions or concerns.   High-Fiber Diet Fiber, also called dietary fiber, is a type of carbohydrate that is found in fruits, vegetables, whole grains, and beans. A high-fiber diet can have many health benefits. Your health care provider may recommend a high-fiber diet to help:  Prevent constipation. Fiber can make your bowel movements more regular.  Lower your cholesterol.  Relieve the following conditions: ? Swelling of veins in the anus (hemorrhoids). ? Swelling and irritation (inflammation) of specific areas of the digestive tract (uncomplicated diverticulosis). ? A problem of the large intestine (colon) that sometimes causes pain and diarrhea (irritable bowel syndrome, IBS).  Prevent overeating as part of a weight-loss plan.  Prevent heart disease, type 2 diabetes, and certain cancers. What is my plan? The recommended daily fiber intake in grams (g) includes:  38 g for men age 39 or younger.  30 g for men over age 75.  65 g for women age 74 or younger.  21 g for women over age 5. You can get the recommended daily intake of dietary fiber by:  Eating a variety of fruits, vegetables, grains, and beans.  Taking a fiber supplement, if it is not possible to get enough fiber through your diet. What do I need to know about a high-fiber diet?  It is better to get fiber through food sources rather than from fiber supplements. There is not a lot of  research about how effective supplements are.  Always check the fiber content on the nutrition facts label of any prepackaged food. Look for foods that contain 5 g of fiber or more per serving.  Talk with a diet and nutrition specialist (dietitian) if you have questions about specific foods that are recommended or not recommended for your medical condition, especially if those foods are not listed below.  Gradually increase how much fiber you consume. If you increase your intake of dietary fiber too quickly, you may have bloating, cramping, or gas.  Drink plenty of water. Water helps you to digest fiber. What are tips for following this plan?  Eat a wide variety of high-fiber foods.  Make sure that half of the grains that you eat each day are whole grains.  Eat breads and cereals that are made with whole-grain flour instead of refined flour or white flour.  Eat brown rice, bulgur wheat, or millet instead of white rice.  Start the day with a breakfast that is high in fiber, such as a cereal that contains 5 g of fiber or more per serving.  Use beans in place of meat in soups, salads, and pasta dishes.  Eat high-fiber snacks, such as berries, raw vegetables, nuts, and popcorn.  Choose whole fruits and vegetables instead of processed forms like juice or sauce. What foods can I eat?  Fruits Berries. Pears. Apples. Oranges. Avocado. Prunes and raisins. Dried figs. Vegetables Sweet potatoes. Spinach. Kale. Artichokes. Cabbage. Broccoli. Cauliflower. Green peas. Carrots. Squash. Grains Whole-grain breads. Multigrain cereal. Oats and oatmeal. Brown rice. Barley. Bulgur wheat. [place]. Quinoa. Bran muffins. Popcorn. Rye wafer crackers. Meats and other proteins Navy, kidney, and pinto beans. Soybeans. Split peas. Lentils. Nuts and seeds. Dairy Fiber-fortified yogurt. Beverages Fiber-fortified soy milk. Fiber-fortified orange juice. Other foods Fiber bars. The items listed above may not  be a complete list of recommended foods and beverages. Contact a dietitian for more options. What foods are not recommended? Fruits Fruit juice. Cooked, strained fruit. Vegetables Fried potatoes. Canned vegetables. Well-cooked vegetables. Grains White bread. Pasta made with refined flour. White rice. Meats and other proteins Fatty cuts of meat. Fried chicken or fried fish. Dairy Milk. Yogurt. Cream cheese. Sour cream. Fats and oils Butters. Beverages Soft drinks. Other foods Cakes and pastries. The items listed above may not be a complete list of foods and beverages to avoid. Contact a dietitian for more information. Summary  Fiber is a type of carbohydrate. It is found in fruits, vegetables, whole grains, and beans.  There are many health benefits of eating a high-fiber diet, such as preventing constipation, lowering blood cholesterol, helping with weight loss, and reducing your risk of heart disease, diabetes, and certain cancers.  Gradually increase your intake of fiber. Increasing too fast can result in cramping, bloating, and gas. Drink plenty of water while you increase your fiber.  The best sources of fiber include whole fruits and vegetables, whole grains, nuts, seeds, and beans. This information is not intended to replace advice given to you by your health care provider. Make sure you discuss any questions you have with your health care provider. Document Revised: 07/05/2017 Document Reviewed: 07/05/2017 Elsevier Patient Education  2020 [employer].

## 2019-12-14 ENCOUNTER — Ambulatory Visit: Payer: Medicaid Other | Admitting: Family Medicine

## 2019-12-18 ENCOUNTER — Telehealth: Payer: Self-pay

## 2019-12-18 NOTE — Telephone Encounter (Signed)
Patient is calling because she wants to have a colonoscopy done. Patient states she is not having any rectal bleeding but is having normal bowel movements that are 3-4 times a day. Please advised if she can have this done and what the diagnosis will be. Patient knows you are out of the office this week and will return on 12/25/2019

## 2019-12-22 NOTE — Telephone Encounter (Signed)
Patient verbalized understanding  

## 2019-12-22 NOTE — Telephone Encounter (Signed)
She already had one in 2019, I do not recommend another colonoscopy at this time  RV

## 2020-01-02 DIAGNOSIS — Z7689 Persons encountering health services in other specified circumstances: Secondary | ICD-10-CM | POA: Diagnosis not present

## 2020-01-02 DIAGNOSIS — M179 Osteoarthritis of knee, unspecified: Secondary | ICD-10-CM | POA: Diagnosis not present

## 2020-01-02 DIAGNOSIS — M1711 Unilateral primary osteoarthritis, right knee: Secondary | ICD-10-CM | POA: Diagnosis not present

## 2020-01-12 ENCOUNTER — Ambulatory Visit: Payer: Medicaid Other | Admitting: Family Medicine

## 2020-01-19 ENCOUNTER — Ambulatory Visit (INDEPENDENT_AMBULATORY_CARE_PROVIDER_SITE_OTHER): Payer: Medicaid Other | Admitting: Family Medicine

## 2020-01-19 ENCOUNTER — Other Ambulatory Visit: Payer: Self-pay

## 2020-01-19 ENCOUNTER — Encounter: Payer: Self-pay | Admitting: Family Medicine

## 2020-01-19 VITALS — BP 152/86 | HR 78 | Temp 98.1°F | Resp 14 | Ht 62.0 in | Wt 153.2 lb

## 2020-01-19 DIAGNOSIS — E039 Hypothyroidism, unspecified: Secondary | ICD-10-CM | POA: Diagnosis not present

## 2020-01-19 DIAGNOSIS — M79641 Pain in right hand: Secondary | ICD-10-CM

## 2020-01-19 DIAGNOSIS — F331 Major depressive disorder, recurrent, moderate: Secondary | ICD-10-CM

## 2020-01-19 DIAGNOSIS — Z7689 Persons encountering health services in other specified circumstances: Secondary | ICD-10-CM | POA: Diagnosis not present

## 2020-01-19 MED ORDER — FLUOXETINE HCL 20 MG PO CAPS: 20.0000 mg | ORAL_CAPSULE | Freq: Every day | ORAL | 3 refills | Status: DC

## 2020-01-19 MED ORDER — MELOXICAM 15 MG PO TABS: 15.0000 mg | ORAL_TABLET | Freq: Every day | ORAL | 1 refills | Status: DC | PRN

## 2020-01-19 NOTE — Patient Instructions (Signed)
RICE Therapy for Routine Care of Injuries Many injuries can be cared for with rest, ice, compression, and elevation (RICE therapy). This includes:  Resting the injured part.  Putting ice on the injury.  Putting pressure (compression) on the injury.  Raising the injured part (elevation). Using RICE therapy can help to lessen pain and swelling. Supplies needed:  Ice.  Plastic bag.  Towel.  Elastic bandage.  Pillow or pillows to raise (elevate) your injured body part. How to care for your injury with RICE therapy Rest Limit your normal activities, and try not to use the injured part of your body. You can go back to your normal activities when your doctor says it is okay to do them and you feel okay. Ask your doctor if you should do exercises to help your injury get better. Ice Put ice on the injured area. Do not put ice on your bare skin.  Put ice in a plastic bag.  Place a towel between your skin and the bag.  Leave the ice on for 20 minutes, 2-3 times a day. Use ice on as many days as told by your doctor.  Compression Compression means putting pressure on the injured area. This can be done with an elastic bandage. If an elastic bandage has been put on your injury:  Do not wrap the bandage too tight. Wrap the bandage more loosely if part of your body away from the bandage is blue, swollen, cold, painful, or loses feeling (gets numb).  Take off the bandage and put it on again. Do this every 3-4 hours or as told by your doctor.  See your doctor if the bandage seems to make your problems worse.  Elevation Elevation means keeping the injured area raised. If you can, raise the injured area above your heart or the center of your chest. Contact a doctor if:  You keep having pain and swelling.  Your symptoms get worse. Get help right away if:  You have sudden bad pain at your injury or lower than your injury.  You have redness or more swelling around your injury.  You  have tingling or numbness at your injury or lower than your injury, and it does not go away when you take off the bandage. Summary  Many injuries can be cared for using rest, ice, compression, and elevation (RICE therapy).  You can go back to your normal activities when you feel okay and your doctor says it is okay.  Put ice on the injured area as told by your doctor.  Get help if your symptoms get worse or if you keep having pain and swelling. This information is not intended to replace advice given to you by your health care provider. Make sure you discuss any questions you have with your health care provider. Document Revised: 05/21/2017 Document Reviewed: 05/21/2017 Elsevier Patient Education  2020 [employer]  
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
## 2020-01-20 LAB — TSH: TSH: 2.2 mIU/L

## 2020-01-22 ENCOUNTER — Other Ambulatory Visit: Payer: Self-pay | Admitting: Family Medicine

## 2020-01-22 DIAGNOSIS — E039 Hypothyroidism, unspecified: Secondary | ICD-10-CM

## 2020-01-22 MED ORDER — LEVOTHYROXINE SODIUM 75 MCG PO TABS: 75.0000 ug | ORAL_TABLET | Freq: Every day | ORAL | 3 refills | Status: DC

## 2020-01-29 DIAGNOSIS — Z7689 Persons encountering health services in other specified circumstances: Secondary | ICD-10-CM | POA: Diagnosis not present

## 2020-01-29 DIAGNOSIS — S60221A Contusion of right hand, initial encounter: Secondary | ICD-10-CM | POA: Diagnosis not present

## 2020-02-06 ENCOUNTER — Other Ambulatory Visit: Payer: Self-pay | Admitting: Family Medicine

## 2020-02-06 ENCOUNTER — Ambulatory Visit (INDEPENDENT_AMBULATORY_CARE_PROVIDER_SITE_OTHER): Payer: Medicaid Other | Admitting: Gastroenterology

## 2020-02-06 ENCOUNTER — Encounter: Payer: Self-pay | Admitting: Gastroenterology

## 2020-02-06 ENCOUNTER — Other Ambulatory Visit: Payer: Self-pay

## 2020-02-06 VITALS — BP 150/91 | HR 64 | Temp 98.4°F | Wt 155.2 lb

## 2020-02-06 DIAGNOSIS — K5904 Chronic idiopathic constipation: Secondary | ICD-10-CM | POA: Diagnosis not present

## 2020-02-06 DIAGNOSIS — Z7689 Persons encountering health services in other specified circumstances: Secondary | ICD-10-CM | POA: Diagnosis not present

## 2020-02-06 DIAGNOSIS — Z1231 Encounter for screening mammogram for malignant neoplasm of breast: Secondary | ICD-10-CM

## 2020-02-06 MED ORDER — LINACLOTIDE 290 MCG PO CAPS: 290.0000 ug | ORAL_CAPSULE | Freq: Every day | ORAL | 0 refills | Status: DC

## 2020-02-06 NOTE — Progress Notes (Signed)
Kristina Darby, MD [name]  [place], [name] [address]  Main: [phone]  Fax: [phone] Pager: [phone]   Primary Care Physician: Delsa Grana, PA-C  Primary Gastroenterologist:  Dr. Vicente Males  Chief Complaint  Patient presents with  . Hemorrhoids    Patient is having some rectal bleeding   . Constipation    Patient states bowel movements are hard with some abdominal pain     HPI: Kristina Reed is a 54 y.o. female who regularly sees Dr. Vicente Males for eosinophilic esophagitis is referred to me for management of hemorrhoids. Patient reports that she underwent hemorrhoid banding by Dr [name] 3 years ago underwent 3 banding sessions and she did not think the procedure was helpful in alleviating her hemorrhoid symptoms. Her symptoms are predominantly itching, burning, swelling, pressure, rectal bleeding. She has history of chronic constipation and spends about 10-20 minutes time on toilet, due to significant straining/pushing. She consumes red meat, fried foods regularly. She has not tried any stool softener or fiber supplements.  Follow-up visit 01/19/2018 She tried linaclotide 117mcg which resulted in severe diarrhea and mild rectal bleeding which resolved. She no longer experiences diarrhea. Reports having one to 2 soft bowel movements daily, but associated with pushing. She denies any symptoms from hemorrhoids at this time. She is taking fiber supplements as recommended daily.  Follow-up visit 02/11/2018 She reports that her stools are very soft and resulted in incontinence on 2 separate occasions in last 2 days. She stopped all the fried foods. She is taking fiber supplements daily. She denies abdominal pain, nausea, vomiting, abdominal cramps, blood in stools. She denies taking any recent antibiotics, sick contacts or eating out. She is here for hemorrhoid banding today  Follow-up visit 03/25/2018 She reports recurrence of dysphagia to solids. She was treated  with Flovent for 8 weeks in the past for his peptic esophagitis. She is currently off PPI as well. She is concerned about intermittent episodes of painless rectal bleeding both on wiping and dripping. She denies constipation. She reports some rectal discomfort every time she has a bowel movement. She is here to undergo hemorrhoid ligation.  Follow-up visit 03/07/2019 She made a follow-up to see me due to episode of rectal bleeding that occurred yesterday.  She reports it was bright red blood, 1-2 teaspoonful.  She reports mild rectal discomfort.  She denies constipation. She underwent hemorrhoid ligation x2 in the past Patient underwent EGD for follow-up of eosinophilic esophagitis which revealed significant improvement in inflammation and eosinophilic count.  She was on Dexilant.  Currently, not on PPI.  She denies dysphagia  Follow-up visit 04/25/2019 Patient reports severe constipation.  She could not afford $3 co-pay for Linzess.  She tries prune juice.  She does report rectal bleeding on wiping  Follow-up visit 10/24/2019 Patient is here for follow-up of severe constipation as well as intermittent rectal bleeding.  Patient underwent ligation of internal hemorrhoids x4 so far.  She continues to have intermittent bright red blood per rectum on wiping and in the toilet bowl.  She recently went to ER and her hemoglobin is normal.  She does report ongoing constipation.  She cannot afford any prescription medications for constipation that I gave her so far.  She describes her stools as 1-2 on Bristol stool scale associated with significant straining.  She does consume white bread regularly  Follow-up visit 3-21 Patient is here to discuss about ongoing rectal bleeding-she is interested to be referred to general  surgery.  She reports taking Linzess regularly, however feels constipated  Follow-up visit 02/06/2020 Patient reports ongoing constipation as she is unable to take Linzess regularly due to her  financial constraints.  She was evaluated by general surgery for hemorrhoidectomy and this was not recommended.   Current Outpatient Medications:  .  FLUoxetine (PROZAC) 20 MG capsule, Take 1 capsule (20 mg total) by mouth daily., Disp: 90 capsule, Rfl: 3 .  levothyroxine (SYNTHROID) 75 MCG tablet, Take 1 tablet (75 mcg total) by mouth daily before breakfast., Disp: 90 tablet, Rfl: 3 .  meloxicam (MOBIC) 15 MG tablet, Take 1 tablet (15 mg total) by mouth daily as needed for pain., Disp: 90 tablet, Rfl: 1 .  traMADol (ULTRAM) 50 MG tablet, Take by mouth every 6 (six) hours as needed., Disp: , Rfl:  .  linaclotide (LINZESS) 290 MCG CAPS capsule, Take 1 capsule (290 mcg total) by mouth daily before breakfast., Disp: 90 capsule, Rfl: 0   Allergies as of 02/06/2020 - Review Complete 02/06/2020  Allergen Reaction Noted  . Percocet [oxycodone-acetaminophen] Palpitations 01/20/2015    NSAIDs: none  Antiplts/Anticoagulants/Anti thrombotics: none  GI procedures:  Upper endoscopy 07/22/2017 - Normal examined duodenum. - Gastritis. Biopsied. - Salmon-colored mucosa suspicious for Barrett's esophagus. Biopsied. - Normal mucosa was found in the entire esophagus. Biopsied. DIAGNOSIS:  A. STOMACH; COLD BIOPSY:  - ANTRAL MUCOSA WITH CHANGES CONSISTENT WITH HEALING MUCOSAL INJURY.  - NEGATIVE FOR H. PYLORI, DYSPLASIA, AND MALIGNANCY.   B. ESOPHAGUS, DISTAL; COLD BIOPSY:  - GASTROESOPHAGITIS.  - UP TO 40 INTRAEPITHELIAL EOSINOPHILS IN A HIGH-POWER FIELD WITH  EOSINOPHILIC MICROABSCESS FORMATION.  - PANCREATIC ACINAR CELL METAPLASIA.  - NEGATIVE FOR INTESTINAL METAPLASIA, DYSPLASIA, AND MALIGNANCY.   C. ESOPHAGUS; RANDOM BIOPSY:  - SQUAMOUS MUCOSA WITH AN INCREASE IN INTRAEPITHELIAL EOSINOPHILS  INVOLVING ALL BIOPSY FRAGMENTS, SEE COMMENT.  - UP TO 55 EOSINOPHILS IN A HIGH-POWER FIELD WITH EOSINOPHILIC  MICROABSCESS FORMATION.  - NEGATIVE FOR DYSPLASIA AND MALIGNANCY.   Colonoscopy on 09/2017  by Dr. Vicente Males - Non-bleeding internal hemorrhoids. - Diverticulosis in the sigmoid colon. - The examination was otherwise normal on direct and retroflexion views. - No specimens collected.  EGD 03/31/2018 - Normal duodenal bulb and second portion of the duodenum. - Normal stomach. - Esophagogastric landmarks identified. - Normal esophagus. Biopsied.  DIAGNOSIS:  A. DISTAL ESOPHAGUS; COLD BIOPSY:  - SQUAMOUS MUCOSA WITH UP TO 10 IN A HIGH-POWER FIELD INTRAEPITHELIAL  EOSINOPHILS.  - NEGATIVE FOR DYSPLASIA AND MALIGNANCY.   B. PROXIMAL ESOPHAGUS; COLD BIOPSY:  - SQUAMOUS MUCOSA NEGATIVE FOR EOSINOPHILS, DYSPLASIA AND MALIGNANCY.   ROS:  General: Negative for anorexia, weight loss, fever, chills, fatigue, weakness. ENT: Negative for hoarseness, difficulty swallowing , nasal congestion. CV: Negative for chest pain, angina, palpitations, dyspnea on exertion, peripheral edema.  Respiratory: Negative for dyspnea at rest, dyspnea on exertion, cough, sputum, wheezing.  GI: See history of present illness. GU:  Negative for dysuria, hematuria, urinary incontinence, urinary frequency, nocturnal urination.  Endo: Negative for unusual weight change.    Physical Examination:   BP (!) 150/91 (BP Location: Left Arm, Patient Position: Sitting, Cuff Size: Normal)   Pulse 64   Temp 98.4 F (36.9 C) (Oral)   Wt 155 lb 4 oz (70.4 kg)   LMP 05/09/1993 (Approximate)   BMI 28.40 kg/m   General: Well-nourished, well-developed in no acute distress.  Eyes: No icterus. Conjunctivae pink. Mouth: Oropharyngeal mucosa moist and pink , no lesions erythema or exudate. Lungs: Clear to auscultation bilaterally.  Non-labored. Heart: Regular rate and rhythm, no murmurs rubs or gallops.  Abdomen: Bowel sounds are normal, nontender, nondistended, no hepatosplenomegaly or masses, no hernia , no rebound or guarding.   Rectal exam: Nontender, normal perianal exam, hypopigmented perianal skin Extremities: No  lower extremity edema. No clubbing or deformities. Neuro: Alert and oriented x 3.  Grossly intact. Skin: Warm and dry, no jaundice.   Psych: Alert and cooperative, normal mood and affect.   Imaging Studies: No results found.  Assessment and Plan:   EZMA QIU is a 54 y.o. female history of eosinophilic esophagitis treated with Flovent in the past, chronic constipation and intermittent rectal bleeding here for follow-up. Prior history of banding about 3 years ago with modest benefit. She now has recurrence of dysphagia  Dysphagia to solids: with history of eosinophilic Esophagitis EGD revealed improvement in eosinophilic count, responsive to PPI Currently asymptomatic - Currently not on PPI  Chronic constipation: Patient has tried different medications for constipation including linaclotide, Amitiza and MiraLAX Her diet is devoid of fiber Continue Linzess 290 MCG daily, samples provided  Symptomatic hemorrhoids with rectal bleeding: - I performed hemorrhoidal ligation x 4 in the past - Patient was evaluated by general surgery who did not recommend hemorrhoidectomy  Follow up as needed   Dr Sherri Sear, MD

## 2020-02-15 ENCOUNTER — Encounter: Payer: Medicaid Other | Admitting: Family Medicine

## 2020-02-21 DIAGNOSIS — Z7689 Persons encountering health services in other specified circumstances: Secondary | ICD-10-CM | POA: Diagnosis not present

## 2020-02-21 DIAGNOSIS — B351 Tinea unguium: Secondary | ICD-10-CM | POA: Diagnosis not present

## 2020-02-21 DIAGNOSIS — M79671 Pain in right foot: Secondary | ICD-10-CM | POA: Diagnosis not present

## 2020-02-21 DIAGNOSIS — M79672 Pain in left foot: Secondary | ICD-10-CM | POA: Diagnosis not present

## 2020-03-07 ENCOUNTER — Ambulatory Visit
Admission: RE | Admit: 2020-03-07 | Discharge: 2020-03-07 | Disposition: A | Discharge status: Home / Self Care | Payer: Medicaid Other | Source: Ambulatory Visit | Attending: Family Medicine | Admitting: Family Medicine

## 2020-03-07 DIAGNOSIS — Z1231 Encounter for screening mammogram for malignant neoplasm of breast: Secondary | ICD-10-CM | POA: Diagnosis not present

## 2020-03-07 DIAGNOSIS — Z7689 Persons encountering health services in other specified circumstances: Secondary | ICD-10-CM | POA: Diagnosis not present

## 2020-03-12 ENCOUNTER — Ambulatory Visit: Payer: Medicaid Other | Admitting: Family Medicine

## 2020-03-12 ENCOUNTER — Encounter: Payer: Self-pay | Admitting: Family Medicine

## 2020-03-12 ENCOUNTER — Other Ambulatory Visit: Payer: Self-pay

## 2020-03-12 VITALS — BP 110/68 | HR 78 | Temp 97.8°F | Resp 16 | Ht 62.0 in | Wt 154.0 lb

## 2020-03-12 DIAGNOSIS — F331 Major depressive disorder, recurrent, moderate: Secondary | ICD-10-CM

## 2020-03-12 DIAGNOSIS — Z8541 Personal history of malignant neoplasm of cervix uteri: Secondary | ICD-10-CM

## 2020-03-12 DIAGNOSIS — R102 Pelvic and perineal pain: Secondary | ICD-10-CM

## 2020-03-12 DIAGNOSIS — Z0000 Encounter for general adult medical examination without abnormal findings: Secondary | ICD-10-CM | POA: Diagnosis not present

## 2020-03-12 DIAGNOSIS — Z7689 Persons encountering health services in other specified circumstances: Secondary | ICD-10-CM | POA: Diagnosis not present

## 2020-03-12 DIAGNOSIS — N9489 Other specified conditions associated with female genital organs and menstrual cycle: Secondary | ICD-10-CM

## 2020-03-12 DIAGNOSIS — Z124 Encounter for screening for malignant neoplasm of cervix: Secondary | ICD-10-CM | POA: Diagnosis not present

## 2020-03-12 LAB — LIPID PANEL
Cholesterol: 190 mg/dL (ref <200)
HDL: 71 mg/dL (ref >=50)
LDL Cholesterol (Calc): 94 mg/dL (calc)
Non-HDL Cholesterol (Calc): 119 mg/dL (calc) (ref <130)
Total CHOL/HDL Ratio: 2.7 (calc) (ref <5.0)
Triglycerides: 157 mg/dL — ABNORMAL HIGH (ref <150)

## 2020-03-12 LAB — COMPLETE METABOLIC PANEL WITH GFR
AG Ratio: 1.5 (calc) (ref 1.0–2.5)
ALT: 24 U/L (ref 6–29)
AST: 21 U/L (ref 10–35)
Albumin: 4.2 g/dL (ref 3.6–5.1)
Alkaline phosphatase (APISO): 66 U/L (ref 37–153)
BUN: 17 mg/dL (ref 7–25)
CO2: 30 mmol/L (ref 20–32)
Calcium: 9.1 mg/dL (ref 8.6–10.4)
Chloride: 98 mmol/L (ref 98–110)
Creat: 0.77 mg/dL (ref 0.50–1.05)
GFR, Est African American: 104 mL/min/{1.73_m2} (ref >=60)
GFR, Est Non African American: 89 mL/min/{1.73_m2} (ref >=60)
Globulin: 2.8 g/dL (calc) (ref 1.9–3.7)
Glucose, Bld: 87 mg/dL (ref 65–99)
Potassium: 4.1 mmol/L (ref 3.5–5.3)
Sodium: 136 mmol/L (ref 135–146)
Total Bilirubin: 0.3 mg/dL (ref 0.2–1.2)
Total Protein: 7 g/dL (ref 6.1–8.1)

## 2020-03-12 LAB — CBC WITH DIFFERENTIAL/PLATELET
Absolute Monocytes: 380 cells/uL (ref 200–950)
Basophils Absolute: 28 cells/uL (ref 0–200)
Basophils Relative: 0.5 %
Eosinophils Absolute: 363 cells/uL (ref 15–500)
Eosinophils Relative: 6.6 %
HCT: 39 % (ref 35.0–45.0)
Hemoglobin: 13.2 g/dL (ref 11.7–15.5)
Lymphs Abs: 2134 cells/uL (ref 850–3900)
MCH: 29 pg (ref 27.0–33.0)
MCHC: 33.8 g/dL (ref 32.0–36.0)
MCV: 85.7 fL (ref 80.0–100.0)
MPV: 10.5 fL (ref 7.5–12.5)
Monocytes Relative: 6.9 %
Neutro Abs: 2596 cells/uL (ref 1500–7800)
Neutrophils Relative %: 47.2 %
Platelets: 160 10*3/uL (ref 140–400)
RBC: 4.55 10*6/uL (ref 3.80–5.10)
RDW: 12.7 % (ref 11.0–15.0)
Total Lymphocyte: 38.8 %
WBC: 5.5 10*3/uL (ref 3.8–10.8)

## 2020-03-12 NOTE — Patient Instructions (Signed)

## 2020-03-12 NOTE — Progress Notes (Signed)
Patient: Kristina Reed, Female    DOB: 1966-06-02, 54 y.o.   MRN: [id number] Delsa Grana, PA-C Visit Date: 03/12/2020  Today's Provider: Delsa Grana, PA-C   Chief Complaint  Patient presents with  . Annual Exam   Subjective:   Annual physical exam:  Kristina Reed is a 54 y.o. female who presents today for complete physical exam:  Exercise/Activity:  Walking around the house, no other exercise  Diet/nutrition:  She reports eating burgers fries and fruits and veggies  Sleep:  Sleeps well, sometimes she has trouble with sleep due to pmhx of OSA, not using cpap  Checking to see hx - if she needs PAP or not due to past surgical and med hx   Sees GI - previously on linzess, past eosinophilic esophagitis   Abnormal appearance to vulva, labia, evaluate for VIN or cancer   USPSTF grade A and B recommendations - reviewed and addressed today  Depression:  Phq 9 completed today by patient, was reviewed by me with patient in the room PHQ score is neg, reviewed, pt feels mood is good PHQ 2/9 Scores 03/12/2020 01/19/2020 08/01/2019 06/28/2019  PHQ - 2 Score 0 0 0 0  PHQ- 9 Score 0 0 0 0   Depression screen [facility] 2/9 03/12/2020 01/19/2020 08/01/2019 06/28/2019 03/02/2019  Decreased Interest 0 0 0 0 1  Down, Depressed, Hopeless 0 0 0 0 1  PHQ - 2 Score 0 0 0 0 2  Altered sleeping 0 0 0 0 1  Tired, decreased energy 0 0 0 0 1  Change in appetite 0 0 0 0 0  Feeling bad or failure about yourself  0 0 0 0 0  Trouble concentrating 0 0 0 0 0  Moving slowly or fidgety/restless 0 0 0 0 0  Suicidal thoughts 0 0 0 0 0  PHQ-9 Score 0 0 0 0 4  Difficult doing work/chores Not difficult at all Not difficult at all Not difficult at all Not difficult at all Not difficult at all  Some recent data might be hidden    Alcohol screening:   [facility] Visit from 03/12/2020 in [facility]  AUDIT-C Score 0      Immunizations and Health Maintenance:     Health Maintenance    Topic Date Due  . PAP SMEAR-Modifier  09/03/2019  . INFLUENZA VACCINE  04/14/2020  . MAMMOGRAM  03/07/2021  . TETANUS/TDAP  09/02/2026  . COLONOSCOPY  09/24/2027  . Hepatitis C Screening  Completed  . HIV Screening  Completed     Hep C Screening: completed  STD testing and prevention (HIV/chl/gon/syphilis): one partner for 20 years  see above, no additional testing desired by pt today  Intimate partner violence:  BF and pt yell at each other, but no physical abuse  Sexual History/Pain during Intercourse: Legally Separated- not with husband from years ago  Menstrual History/LMP/Abnormal Bleeding: hysterectomy  Patient's last menstrual period was 05/09/1993 (approximate).  Incontinence Symptoms:  No sx  Breast cancer:   03/07/2020  Last Mammogram: *see HM list above BRCA gene screening: negative genetic testing  Cervical cancer screening: hysterectomy, but also noted on chart PMHx cervical cancer hx Pt endorses family hx of cancers - breast, ovarian, uterine, colon:    Consulted oncology last year for maternal grandmother and multiple aunts with breast CA, neg genetic tsting  Osteoporosis:   Discussion on osteoporosis per age, including high calcium and vitamin D supplementation, weight bearing exercises Pt is NOT supplementing  with daily calcium/Vit D. No past Bone scan/dexa Roughly experienced menopause - hysterectomy in 30's, poor historian otherwise   Skin cancer:  Hx of skin CA -  NO Discussed atypical lesions   Colorectal cancer:   Colonoscopy is UTD 2019 Discussed concerning signs and sx of CRC, pt denies melena hematochezia or change in BM - est with GI Dr. Marius Ditch   Lung cancer:   Low Dose CT Chest recommended if Age 90-80 years, 30 pack-year currently smoking OR have quit w/in 15years. Patient does not qualify.     Social History   Tobacco Use  . Smoking status: Never Smoker  . Smokeless tobacco: Never Used  Vaping Use  . Vaping Use: Never  used  Substance Use Topics  . Alcohol use: Yes    Comment: twice a week  . Drug use: Not Currently       [facility] Visit from 03/12/2020 in [facility]  AUDIT-C Score 0      Family History  Problem Relation Age of Onset  . Diabetes Mother   . Breast cancer Maternal Grandmother   . Cancer Paternal Grandmother        cancer?  . Cirrhosis Cousin   . Breast cancer Other        x4     Blood pressure/Hypertension: BP Readings from Last 3 Encounters:  03/12/20 110/68  02/06/20 (!) 150/91  01/19/20 (!) 152/86    Weight/Obesity: Wt Readings from Last 3 Encounters:  03/12/20 154 lb (69.9 kg)  02/06/20 155 lb 4 oz (70.4 kg)  01/19/20 153 lb 3.2 oz (69.5 kg)   BMI Readings from Last 3 Encounters:  03/12/20 28.17 kg/m  02/06/20 28.40 kg/m  01/19/20 28.02 kg/m     Lipids:  Lab Results  Component Value Date   CHOL 186 09/05/2018   CHOL 201 (H) 09/03/2017   CHOL 162 09/02/2016   Lab Results  Component Value Date   HDL 46 (L) 09/05/2018   HDL 50 (L) 09/03/2017   HDL 54 09/02/2016   Lab Results  Component Value Date   LDLCALC 114 (H) 09/05/2018   LDLCALC 123 (H) 09/03/2017   LDLCALC 82 09/02/2016   Lab Results  Component Value Date   TRIG 144 09/05/2018   TRIG 159 (H) 09/03/2017   TRIG 129 09/02/2016   Lab Results  Component Value Date   CHOLHDL 4.0 09/05/2018   CHOLHDL 4.0 09/03/2017   CHOLHDL 3.0 09/02/2016   No results found for: LDLDIRECT Based on the results of lipid panel his/her cardiovascular risk factor ( using [place] )  in the next 10 years is: The 10-year ASCVD risk score Mikey Bussing DC Brooke Bonito., et al., 2013) is: 1.1%   Values used to calculate the score:     Age: 59 years     Sex: Female     Is Non-Hispanic African American: No     Diabetic: No     Tobacco smoker: No     Systolic Blood Pressure: 817 mmHg     Is BP treated: No     HDL Cholesterol: 46 mg/dL     Total Cholesterol: 186 mg/dL Glucose:  Glucose  Date Value Ref  Range Status  12/27/2014 90 mg/dL Final    Comment:    65-99 NOTE: New Reference Range  11/20/14   08/29/2014 88 65 - 99 mg/dL Final  02/22/2014 96 65 - 99 mg/dL Final   Glucose, Bld  Date Value Ref Range Status  06/30/2019 85 65 -  99 mg/dL Final    Comment:    .            Fasting reference interval .   02/08/2019 132 (H) 70 - 99 mg/dL Final  01/24/2019 107 (H) 70 - 99 mg/dL Final   Hypertension: BP Readings from Last 3 Encounters:  03/12/20 110/68  02/06/20 (!) 150/91  01/19/20 (!) 152/86   Obesity: Wt Readings from Last 3 Encounters:  03/12/20 154 lb (69.9 kg)  02/06/20 155 lb 4 oz (70.4 kg)  01/19/20 153 lb 3.2 oz (69.5 kg)   BMI Readings from Last 3 Encounters:  03/12/20 28.17 kg/m  02/06/20 28.40 kg/m  01/19/20 28.02 kg/m     Social History      She        Social History   Socioeconomic History  . Marital status: Legally Separated    Spouse name: Jeneen Rinks  . Number of children: 2  . Years of education: Not on file  . Highest education level: Not on file  Occupational History  . Occupation: un employed  Tobacco Use  . Smoking status: Never Smoker  . Smokeless tobacco: Never Used  Vaping Use  . Vaping Use: Never used  Substance and Sexual Activity  . Alcohol use: Yes    Comment: twice a week  . Drug use: Not Currently  . Sexual activity: Not Currently    Birth control/protection: Surgical  Other Topics Concern  . Not on file  Social History Narrative  . Not on file   Social Determinants of Health   Financial Resource Strain:   . Difficulty of Paying Living Expenses:   Food Insecurity:   . Worried About Charity fundraiser in the Last Year:   . Arboriculturist in the Last Year:   Transportation Needs:   . Film/video editor (Medical):   Marland Kitchen Lack of Transportation (Non-Medical):   Physical Activity:   . Days of Exercise per Week:   . Minutes of Exercise per Session:   Stress:   . Feeling of Stress :   Social Connections:   .  Frequency of Communication with Friends and Family:   . Frequency of Social Gatherings with Friends and Family:   . Attends Religious Services:   . Active Member of Clubs or Organizations:   . Attends Archivist Meetings:   Marland Kitchen Marital Status:     Family History        Family History  Problem Relation Age of Onset  . Diabetes Mother   . Breast cancer Maternal Grandmother   . Cancer Paternal Grandmother        cancer?  . Cirrhosis Cousin   . Breast cancer Other        x4    Patient Active Problem List   Diagnosis Date Noted  . Grade I internal hemorrhoids 11/16/2019  . Genetic testing 04/20/2019  . Family history of breast cancer   . History of cervical cancer   . Mastalgia 03/17/2019  . Family hx-breast malignancy 12/16/2018  . Essential hypertension 01/19/2018  . Obesity (BMI 30.0-34.9) 01/19/2018  . Preventative health care 09/03/2017  . Eosinophilic esophagitis 16/06/9603  . Apophysitis 08/26/2017  . 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 Pain in joint, multiple sites 07/05/2017  . Osteoarthritis of knee 07/05/2017  . Lichen sclerosus et atrophicus 04/27/2017  . Headache 04/27/2017  . Headache disorder 02/17/2017  . Post-concussion headache 02/17/2017  . Medication monitoring encounter 09/21/2016  . Hematuria 04/23/2016  .  Hx of cervical cancer 01/30/2016  . Menopause 01/30/2016  . Status post bilateral breast biopsy 01/07/2016  . Screening for STD (sexually transmitted disease) 10/25/2015  . Rectal bleeding 04/30/2015  . Right knee pain 07/23/2014  . Depression, major, recurrent, moderate ([place]) 02/02/2014  . Adult hypothyroidism 02/02/2014  . Obstructive apnea 02/02/2014  . Anxiety and depression 02/02/2014  . Incomplete bladder emptying 12/12/2012  . Tenosynovitis of foot 05/30/2012  . Benign neoplasm of kidney 05/13/2012  . Urge incontinence 05/13/2012  . FOM (frequency of micturition) 05/13/2012  . Chronic pain of right hand 05/05/2012  .  Tarsal tunnel syndrome 03/03/2012    Past Surgical History:  Procedure Laterality Date  . ABDOMINAL HYSTERECTOMY     partial hysterectomy  . BREAST BIOPSY Bilateral 01/01/2016   Fibroadenoma  . COLONOSCOPY WITH PROPOFOL N/A 06/27/2015   Procedure: COLONOSCOPY WITH PROPOFOL;  Surgeon: Josefine Class, MD;  Location: [facility] ENDOSCOPY;  Service: Endoscopy;  Laterality: N/A;  . COLONOSCOPY WITH PROPOFOL N/A 09/23/2017   Procedure: COLONOSCOPY WITH PROPOFOL;  Surgeon: Jonathon Bellows, MD;  Location: [facility] ENDOSCOPY;  Service: Gastroenterology;  Laterality: N/A;  . ESOPHAGOGASTRODUODENOSCOPY (EGD) WITH PROPOFOL N/A 07/22/2017   Procedure: ESOPHAGOGASTRODUODENOSCOPY (EGD) WITH PROPOFOL;  Surgeon: Jonathon Bellows, MD;  Location: [facility] ENDOSCOPY;  Service: Gastroenterology;  Laterality: N/A;  . ESOPHAGOGASTRODUODENOSCOPY (EGD) WITH PROPOFOL N/A 03/31/2018   Procedure: ESOPHAGOGASTRODUODENOSCOPY (EGD) WITH PROPOFOL;  Surgeon: Lin Landsman, MD;  Location: [facility] ENDOSCOPY;  Service: Gastroenterology;  Laterality: N/A;  . KNEE ARTHROSCOPY Right 07/22/2016   Procedure: ARTHROSCOPY KNEE debridement microfracture;  Surgeon: Leanor Kail, MD;  Location: [facility] ORS;  Service: Orthopedics;  Laterality: Right;  . KNEE ARTHROSCOPY WITH MEDIAL MENISECTOMY Left 11/10/2017   Procedure: KNEE ARTHROSCOPY WITH PARTIAL MEDIAL MENISECTOMY&patella femoral debridement, & ablation;  Surgeon: Leanor Kail, MD;  Location: [facility] ORS;  Service: Orthopedics;  Laterality: Left;  . TUBAL LIGATION       Current Outpatient Medications:  .  FLUoxetine (PROZAC) 20 MG capsule, Take 1 capsule (20 mg total) by mouth daily., Disp: 90 capsule, Rfl: 3 .  levothyroxine (SYNTHROID) 75 MCG tablet, Take 1 tablet (75 mcg total) by mouth daily before breakfast., Disp: 90 tablet, Rfl: 3 .  meloxicam (MOBIC) 15 MG tablet, Take 1 tablet (15 mg total) by mouth daily as needed for pain., Disp: 90 tablet, Rfl: 1 .  traMADol (ULTRAM) 50 MG tablet,  Take by mouth every 6 (six) hours as needed., Disp: , Rfl:  .  linaclotide (LINZESS) 290 MCG CAPS capsule, Take 1 capsule (290 mcg total) by mouth daily before breakfast. (Patient not taking: Reported on 03/12/2020), Disp: 90 capsule, Rfl: 0  Allergies  Allergen Reactions  . Percocet [Oxycodone-Acetaminophen] Palpitations    Patient Care Team: Delsa Grana, PA-C as PCP - General (Family Medicine) Harlin Heys, MD as Consulting Physician (Obstetrics and Gynecology)  Review of Systems  Constitutional: Negative.  Negative for activity change, appetite change, fatigue and unexpected weight change.  HENT: Negative.   Eyes: Negative.   Respiratory: Negative.  Negative for shortness of breath.   Cardiovascular: Negative.  Negative for chest pain, palpitations and leg swelling.  Gastrointestinal: Negative.  Negative for abdominal pain and blood in stool.  Endocrine: Negative.   Genitourinary: Negative.   Musculoskeletal: Negative.  Negative for arthralgias, gait problem, joint swelling and myalgias.  Skin: Negative.  Negative for color change, pallor and rash.  Allergic/Immunologic: Negative.   Neurological: Negative.  Negative for syncope and weakness.  Hematological: Negative.   Psychiatric/Behavioral:  Negative.  Negative for confusion, dysphoric mood, self-injury and suicidal ideas. The patient is not nervous/anxious.   All other systems reviewed and are negative.    I personally reviewed active problem list, medication list, allergies, family history, social history, health maintenance, notes from last encounter, lab results, imaging with the patient/caregiver today.        Objective:   Vitals:  Vitals:   03/12/20 1025  BP: 110/68  Pulse: 78  Resp: 16  Temp: 97.8 F (36.6 C)  TempSrc: Temporal  SpO2: 97%  Weight: 154 lb (69.9 kg)  Height: _0  (1.575 m)    Body mass index is 28.17 kg/m.  Physical Exam Vitals and nursing note reviewed.  Constitutional:       General: She is not in acute distress.    Appearance: Normal appearance. She is well-developed. She is not ill-appearing, toxic-appearing or diaphoretic.     Interventions: Face mask in place.  HENT:     Head: Normocephalic and atraumatic.     Right Ear: External ear normal.     Left Ear: External ear normal.     Nose: Nose normal.     Mouth/Throat:     Mouth: Mucous membranes are moist.     Pharynx: Oropharynx is clear.  Eyes:     General: Lids are normal. No scleral icterus.       Right eye: No discharge.        Left eye: No discharge.     Conjunctiva/sclera: Conjunctivae normal.     Pupils: Pupils are equal, round, and reactive to light.  Neck:     Trachea: Phonation normal. No tracheal deviation.  Cardiovascular:     Rate and Rhythm: Normal rate and regular rhythm.     Pulses: Normal pulses.          Radial pulses are 2+ on the right side and 2+ on the left side.       Posterior tibial pulses are 2+ on the right side and 2+ on the left side.     Heart sounds: Normal heart sounds. No murmur heard.  No friction rub. No gallop.   Pulmonary:     Effort: Pulmonary effort is normal. No respiratory distress.     Breath sounds: Normal breath sounds. No stridor. No wheezing, rhonchi or rales.  Chest:     Chest wall: No tenderness.  Abdominal:     General: Bowel sounds are normal. There is no distension.     Palpations: Abdomen is soft.     Tenderness: There is no abdominal tenderness. There is no guarding or rebound.  Musculoskeletal:     Cervical back: Normal range of motion and neck supple.     Right lower leg: No edema.     Left lower leg: No edema.  Lymphadenopathy:     Cervical: No cervical adenopathy.  Skin:    General: Skin is warm and dry.     Coloration: Skin is not jaundiced or pale.     Findings: No rash.  Neurological:     Mental Status: She is alert. Mental status is at baseline.     Motor: No abnormal muscle tone.     Gait: Gait normal.  Psychiatric:         Mood and Affect: Mood normal.        Behavior: Behavior normal. Behavior is cooperative.       Fall Risk: Fall Risk  03/12/2020 01/19/2020 08/01/2019 06/28/2019 03/20/2019  Falls in the past  year? 0 0 1 0 0  Number falls in past yr: 0 0 1 0 -  Injury with Fall? 0 0 0 0 -  Follow up Falls evaluation completed - Falls evaluation completed Falls evaluation completed -    Functional Status Survey: Is the patient deaf or have difficulty hearing?: No Does the patient have difficulty seeing, even when wearing glasses/contacts?: No Does the patient have difficulty concentrating, remembering, or making decisions?: No Does the patient have difficulty walking or climbing stairs?: No Does the patient have difficulty dressing or bathing?: No Does the patient have difficulty doing errands alone such as visiting a doctor's office or shopping?: No   Assessment & Plan:    CPE completed today  . USPSTF grade A and B recommendations reviewed with patient; age-appropriate recommendations, preventive care, screening tests, etc discussed and encouraged; healthy living encouraged; see AVS for patient education given to patient  . Discussed importance of 150 minutes of physical activity weekly, AHA exercise recommendations given to pt in AVS/handout  . Discussed importance of healthy diet:  eating lean meats and proteins, avoiding trans fats and saturated fats, avoid simple sugars and excessive carbs in diet, eat 6 servings of fruit/vegetables daily and drink plenty of water and avoid sweet beverages.    . Recommended pt to do annual eye exam and routine dental exams/cleanings  . Depression, alcohol, fall screening completed as documented above and per flowsheets  . Reviewed Health Maintenance: Health Maintenance  Topic Date Due  . PAP SMEAR-Modifier  09/03/2019  . INFLUENZA VACCINE  04/14/2020  . MAMMOGRAM  03/07/2021  . TETANUS/TDAP  09/02/2026  . COLONOSCOPY  09/24/2027  . Hepatitis C Screening   Completed  . HIV Screening  Completed    . Immunizations: Immunization History  Administered Date(s) Administered  . Influenza,inj,Quad PF,6+ Mos 05/24/2015, 08/26/2017, 05/30/2018, 06/16/2019  . Influenza-Unspecified 07/15/2016  . Pneumococcal Conjugate-13 09/02/2016  . Tdap 09/02/2016     ICD-10-CM   1. Adult general medical exam  Z00.00 CBC with Differential/Platelet    COMPLETE METABOLIC PANEL WITH GFR    Lipid panel  2. Screening for malignant neoplasm of cervix  Z12.4    due - past PAP was abnormal with notes of cervical CA, abnormal vulva, vaginectomy?  History many years ago partial hysterectomy Patient was referred to [place] and then to [place] in 2017 and 2018.  They are unclear notes of different types of surgeries and cervical cancer history, vaginal atrophy, and PCP previously concerned for CIN/cancer -patient is a poor historian she is unable to give me any filled and history and she cannot remember the last OB/GYN that she saw or give me details about any past procedures or screenings. She denies any pelvic symptoms, is sexually active with one female partner for the past 20 years she denies any postcoital spotting, dyspareunia, and she does not want to do pelvic exam here today due to vaginal atrophy/discomfort.  I did explain the patient that I will try and refer her to GYN and continue to look for medical records that would explain what happened a few years ago.   3. Depression, major, recurrent, moderate ([facility])  F33.1    Mood good, patient pleasant, well controlled with Prozac 20 mg daily  4. Hx of cervical cancer  Z85.41 Ambulatory referral to Gynecology   Per chart-trying to find records of GYN follow-up 2017-2018 after Dr. Sanda Klein did pap and refered pt - prefer gyn to f/up due to multiple abnormalities  5. Vulvovaginal discomfort  N94.89 Ambulatory referral to Gynecology       Delsa Grana, PA-C 03/12/20 10:32 AM  [facility]

## 2020-03-13 ENCOUNTER — Telehealth: Payer: Self-pay | Admitting: Obstetrics & Gynecology

## 2020-03-13 NOTE — Telephone Encounter (Signed)
[facility] referring for F/up on abnormal labs from prior PCP- notes of hx of cervical CA Per Dr. Sanda Klein a few year ago "Abnormal appearance to vulva, labia, evaluate for VIN or cancer". Called and left voicemail for patient to call back to be scheduled.

## 2020-03-14 DIAGNOSIS — Z419 Encounter for procedure for purposes other than remedying health state, unspecified: Secondary | ICD-10-CM | POA: Diagnosis not present

## 2020-03-29 ENCOUNTER — Encounter: Payer: Medicaid Other | Admitting: Obstetrics & Gynecology

## 2020-04-14 DIAGNOSIS — Z419 Encounter for procedure for purposes other than remedying health state, unspecified: Secondary | ICD-10-CM | POA: Diagnosis not present

## 2020-04-16 ENCOUNTER — Ambulatory Visit (INDEPENDENT_AMBULATORY_CARE_PROVIDER_SITE_OTHER): Payer: Medicaid Other | Admitting: Obstetrics & Gynecology

## 2020-04-16 ENCOUNTER — Other Ambulatory Visit (HOSPITAL_COMMUNITY)
Admission: RE | Admit: 2020-04-16 | Discharge: 2020-04-16 | Disposition: A | Discharge status: Home / Self Care | Payer: Medicaid Other | Source: Ambulatory Visit | Attending: Obstetrics & Gynecology | Admitting: Obstetrics & Gynecology

## 2020-04-16 ENCOUNTER — Other Ambulatory Visit: Payer: Self-pay

## 2020-04-16 ENCOUNTER — Encounter: Payer: Self-pay | Admitting: Obstetrics & Gynecology

## 2020-04-16 VITALS — BP 130/90 | Ht 61.5 in | Wt 155.0 lb

## 2020-04-16 DIAGNOSIS — Z1272 Encounter for screening for malignant neoplasm of vagina: Secondary | ICD-10-CM | POA: Insufficient documentation

## 2020-04-16 DIAGNOSIS — R232 Flushing: Secondary | ICD-10-CM | POA: Diagnosis not present

## 2020-04-16 DIAGNOSIS — L900 Lichen sclerosus et atrophicus: Secondary | ICD-10-CM

## 2020-04-16 DIAGNOSIS — Z7689 Persons encountering health services in other specified circumstances: Secondary | ICD-10-CM | POA: Diagnosis not present

## 2020-04-16 MED ORDER — CLONIDINE 0.1 MG/24HR TD PTWK: 0.1000 mg | MEDICATED_PATCH | TRANSDERMAL | 11 refills | Status: DC

## 2020-04-16 NOTE — Progress Notes (Signed)
HPI:      [name] is a 54 y.o. [id number] who is perimenopausal, presents today for a problem visit.  She complains of hot flashes, night sweats.   Symptoms have been present for a few years. Symptoms are mod to severe and has led her to come in today to seek options for intervention.  Previous Treatment: None Prior hysterectomy many years ago, unsure when she truly started menopause No vag dryness or dyspareunia  Prior dx lichen sclerosus from vulvar biopsy.  Treated once w Clobetasol.  Reports no vulvar pain, itching, burning.  She is single partner, contraception - status post hysterectomy. Denies PostMenopausal Bleeding. Hyst done by Dr Laurey Morale years ago.  PMHx: She  has a past medical history of Anxiety, Arthritis, Cancer ([place]), Cognitive impairment, mild, so stated, Depression, Diverticulosis, Eosinophilic esophagitis (41/96/2229), Esophageal dysphagia, Family history of breast cancer, Gallstones, GERD (gastroesophageal reflux disease), History of cervical cancer, cervical cancer (01/30/2016), Hypertension, Hypothyroidism, Sleep apnea, Status post partial hysterectomy, and Vaginal inclusion cyst. Also,  has a past surgical history that includes Abdominal hysterectomy; Colonoscopy with propofol (N/A, 06/27/2015); Tubal ligation; Knee arthroscopy (Right, 07/22/2016); Esophagogastroduodenoscopy (egd) with propofol (N/A, 07/22/2017); Colonoscopy with propofol (N/A, 09/23/2017); Knee arthroscopy with medial menisectomy (Left, 11/10/2017); Esophagogastroduodenoscopy (egd) with propofol (N/A, 03/31/2018); and Breast biopsy (Bilateral, 01/01/2016)., family history includes Breast cancer in her maternal grandmother and another family member; Cancer in her paternal grandmother; Cirrhosis in her cousin; Diabetes in her mother.,  reports that she has never smoked. She has never used smokeless tobacco. She reports current alcohol use. She reports previous drug use.  She has a current medication list which  includes the following prescription(s): fluoxetine, levothyroxine, linaclotide, meloxicam, tramadol, and clonidine. Also, is allergic to percocet [oxycodone-acetaminophen].  Review of Systems  Constitutional: Negative for chills, fever and malaise/fatigue.  HENT: Negative for congestion, sinus pain and sore throat.   Eyes: Negative for blurred vision and pain.  Respiratory: Negative for cough and wheezing.   Cardiovascular: Negative for chest pain and leg swelling.  Gastrointestinal: Negative for abdominal pain, constipation, diarrhea, heartburn, nausea and vomiting.  Genitourinary: Negative for dysuria, frequency, hematuria and urgency.  Musculoskeletal: Negative for back pain, joint pain, myalgias and neck pain.  Skin: Negative for itching and rash.  Neurological: Negative for dizziness, tremors and weakness.  Endo/Heme/Allergies: Does not bruise/bleed easily.  Psychiatric/Behavioral: Negative for depression. The patient is not nervous/anxious and does not have insomnia.     Objective: BP 130/90   Ht 5' 1.5" (1.562 m)   Wt 155 lb (70.3 kg)   LMP 05/09/1993 (Approximate)   BMI 28.81 kg/m  Physical Exam Constitutional:      General: She is not in acute distress.    Appearance: She is well-developed.  Genitourinary:     Pelvic exam was performed with patient supine.     Vulva, urethra, bladder, vagina and rectum normal.     No lesions in the vagina.     No vaginal bleeding.     Cervix is absent.     Uterus is absent.     No right or left adnexal mass present.     Right adnexa not tender.     Left adnexa not tender.     Genitourinary Comments: Some small areas on labia of white skin changes c/w L.S. Vaginal cuff well healed, min atrophy noted  HENT:     Head: Normocephalic and atraumatic. No laceration.     Right Ear: Hearing normal.  Left Ear: Hearing normal.     Mouth/Throat:     Pharynx: Uvula midline.  Eyes:     Pupils: Pupils are equal, round, and reactive to  light.  Neck:     Thyroid: No thyromegaly.  Cardiovascular:     Rate and Rhythm: Normal rate and regular rhythm.     Heart sounds: No murmur heard.  No friction rub. No gallop.   Pulmonary:     Effort: Pulmonary effort is normal. No respiratory distress.     Breath sounds: Normal breath sounds. No wheezing.  Abdominal:     General: Bowel sounds are normal. There is no distension.     Palpations: Abdomen is soft.     Tenderness: There is no abdominal tenderness. There is no rebound.  Musculoskeletal:        General: Normal range of motion.     Cervical back: Normal range of motion and neck supple.  Neurological:     Mental Status: She is alert and oriented to person, place, and time.     Cranial Nerves: No cranial nerve deficit.  Skin:    General: Skin is warm and dry.  Psychiatric:        Judgment: Judgment normal.  Vitals reviewed.     ASSESSMENT/PLAN:    EXN-17-GY   1. 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 Screening for vaginal cancer  Z12.72 Cytology - PAP  3. Hot flashes  R23.2 cloNIDine (CATAPRES - DOSED IN MG/24 HR) 0.1 mg/24hr patch    Clonidine for hot flash and night sweats discussed, Rx    Alternatives discussed, naturalistic/holistic supplements, no therapy, exercise, hormones.    Pros and cons and side effects discussed No tx for LS unless symptomatic PAP today, every 5 years if normal of vagina  A total of 30 minutes were spent face-to-face with the patient as well as preparation, review, communication, and documentation during this encounter.    Barnett Applebaum, MD, [facility], [facility] 04/16/2020  10:57 AM

## 2020-04-16 NOTE — Patient Instructions (Signed)
Clonidine Skin Patches What is this medicine? CLONIDINE (KLOE ni deen) treats high blood pressure. This medicine may be used for other purposes; ask your health care provider or pharmacist if you have questions. COMMON BRAND NAME(S): Catapres-TTS What should I tell my health care provider before I take this medicine? They need to know if you have any of these conditions:  kidney disease  an unusual or allergic reaction to clonidine, other medicines, foods, dyes, or preservatives  pregnant or trying to get pregnant  breast-feeding How should I use this medicine? This medicine is for external use only. Follow the directions on the prescription label. Apply the patch to an area of the upper arm or part of the body that is clean, dry and hairless. Avoid injured, irritated, calloused, or scarred areas. Use a different site each time to prevent skin irritation. Do not cut or trim the patch. One patch should last for 7 days. Do not use your medicine more often than directed. Do not stop using except on the advice of your doctor or health care professional. You must gradually reduce the dose or you may get a dangerous increase in blood pressure. Talk to your pediatrician regarding the use of this medicine in children. Special care may be needed. Overdosage: If you think you have taken too much of this medicine contact a poison control center or emergency room at once. NOTE: This medicine is only for you. Do not share this medicine with others. What if I miss a dose? Replace each patch on the same day of each week, or if the patch falls off. If you do forget to change the patch for two or three days, check with your doctor or health care professional. What may interact with this medicine? Do not take this medicine with any of the following medications:  MAOIs like Carbex, Eldepryl, Marplan, Nardil, and Parnate This medicine may also interact with the following medications:  barbiturate medicines  for inducing sleep or treating seizures like phenobarbital  certain medicines for blood pressure, heart disease, irregular heart beat  certain medicines for depression, anxiety, or psychotic disturbances  prescription pain medicines This list may not describe all possible interactions. Give your health care provider a list of all the medicines, herbs, non-prescription drugs, or dietary supplements you use. Also tell them if you smoke, drink alcohol, or use illegal drugs. Some items may interact with your medicine. What should I watch for while using this medicine? Visit your doctor or health care professional for regular checks on your progress. Check your heart rate and blood pressure regularly while you are using this medicine. Ask your doctor or health care professional what your heart rate should be and when you should contact him or her. You can shower or bathe with the skin patch in position. If the patch gets loose, cover it with the extra adhesive overlay provided. You may get drowsy or dizzy. Do not drive, use machinery, or do anything that needs mental alertness until you know how this medicine affects you. To avoid dizzy or fainting spells, do not stand or sit up quickly, especially if you are an older person. Alcohol can make you more drowsy and dizzy. Avoid alcoholic drinks. Your mouth may get dry. Chewing sugarless gum or sucking hard candy, and drinking plenty of water will help. Do not treat yourself for coughs, colds, or pain while you are using this medicine without asking your doctor or health care professional for advice. Some ingredients may increase your  blood pressure. If you are going to have surgery tell your doctor or health care professional that you are using this medicine. If you are going to have a magnetic resonance imaging (MRI) procedure, tell your MRI technician if you have this patch on your body. It must be removed before a MRI. What side effects may I notice from  receiving this medicine? Side effects that you should report to your doctor or health care professional as soon as possible:  allergic reactions like skin rash, itching or hives, swelling of the face, lips, or tongue  anxiety, nervousness  chest pain  depression  fast, irregular heartbeat  swelling of feet or legs  unusually weak or tired Side effects that usually do not require medical attention (report to your doctor or health care professional if they continue or are bothersome):  change in sex drive or performance  constipation  headache  skin redness, irritation, or darkening under the patch area This list may not describe all possible side effects. Call your doctor for medical advice about side effects. You may report side effects to FDA at 1-800-FDA-1088. Where should I keep my medicine? Keep out of the reach of children. Store at room temperature between 15 and 30 degrees C (59 to 86 degrees F). Throw away any unused medicine after the expiration date. NOTE: This sheet is a summary. It may not cover all possible information. If you have questions about this medicine, talk to your doctor, pharmacist, or health care provider.  2020 Elsevier/Gold Standard (2019-05-09 24:46:28)

## 2020-04-17 ENCOUNTER — Other Ambulatory Visit: Payer: Self-pay | Admitting: Obstetrics & Gynecology

## 2020-04-17 DIAGNOSIS — R232 Flushing: Secondary | ICD-10-CM

## 2020-04-17 LAB — CYTOLOGY - PAP: Diagnosis: NEGATIVE

## 2020-04-17 MED ORDER — CLONIDINE HCL 0.1 MG PO TABS: 0.1000 mg | ORAL_TABLET | Freq: Every day | ORAL | 11 refills | Status: DC

## 2020-04-19 ENCOUNTER — Telehealth: Payer: Self-pay | Admitting: Obstetrics & Gynecology

## 2020-04-19 NOTE — Telephone Encounter (Signed)
Pt aware.

## 2020-04-19 NOTE — Telephone Encounter (Signed)
Patient is calling for labs results. Please advise. 

## 2020-04-19 NOTE — Telephone Encounter (Signed)
Let her know PAP is normal

## 2020-04-24 IMAGING — MG MM DIGITAL SCREENING BILAT W/ CAD
4 series · 4 of 4 positions shown · non-contrast
Comparison: Previous exam(s).

CLINICAL DATA: Screening.

EXAM:
DIGITAL SCREENING BILATERAL MAMMOGRAM WITH CAD

[R CC]
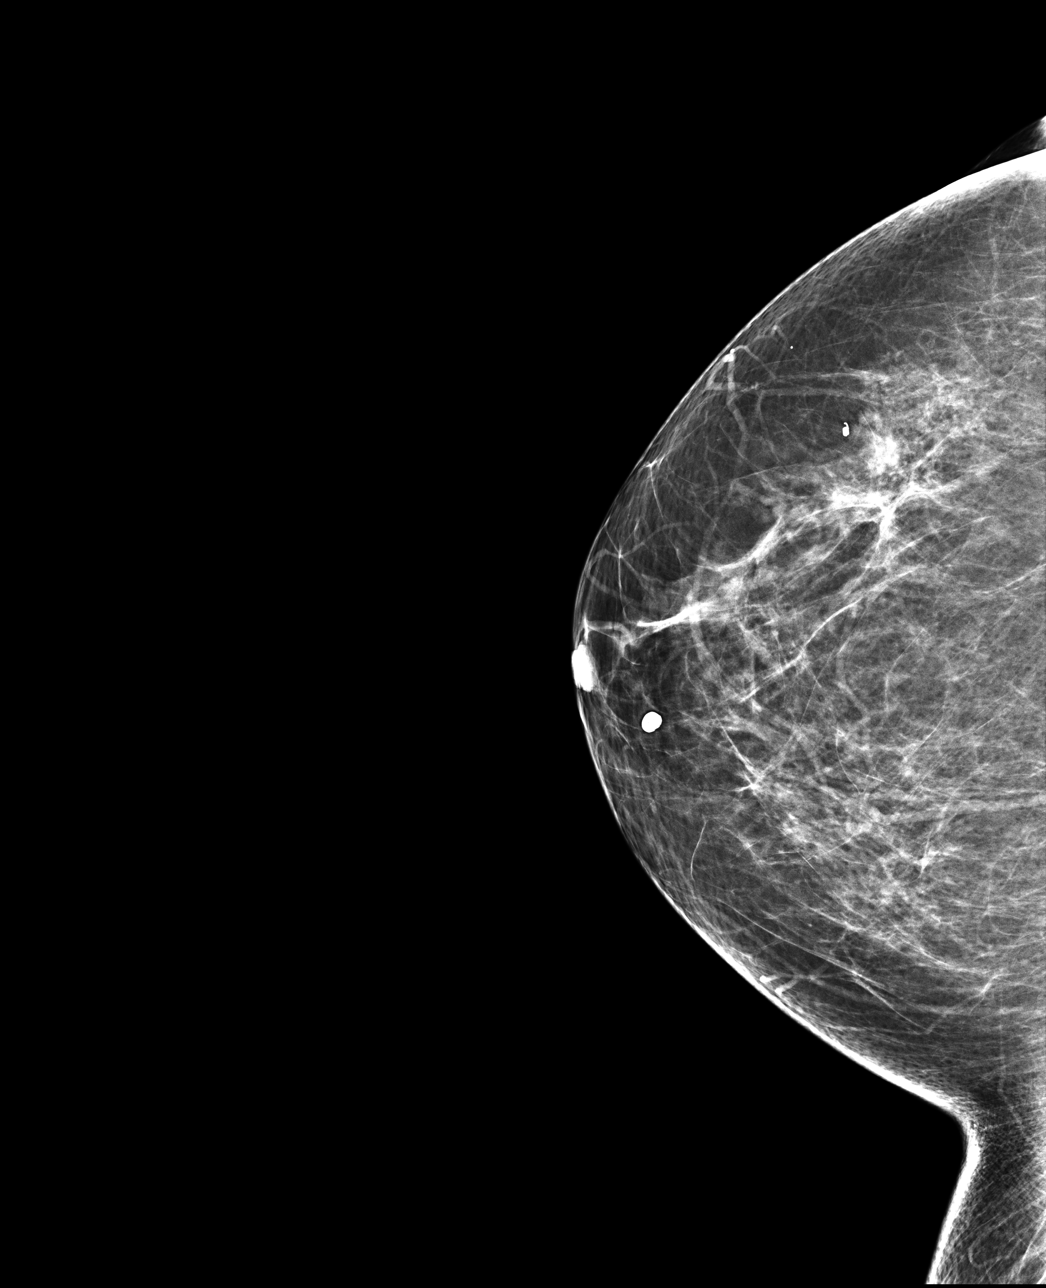

[L CC]
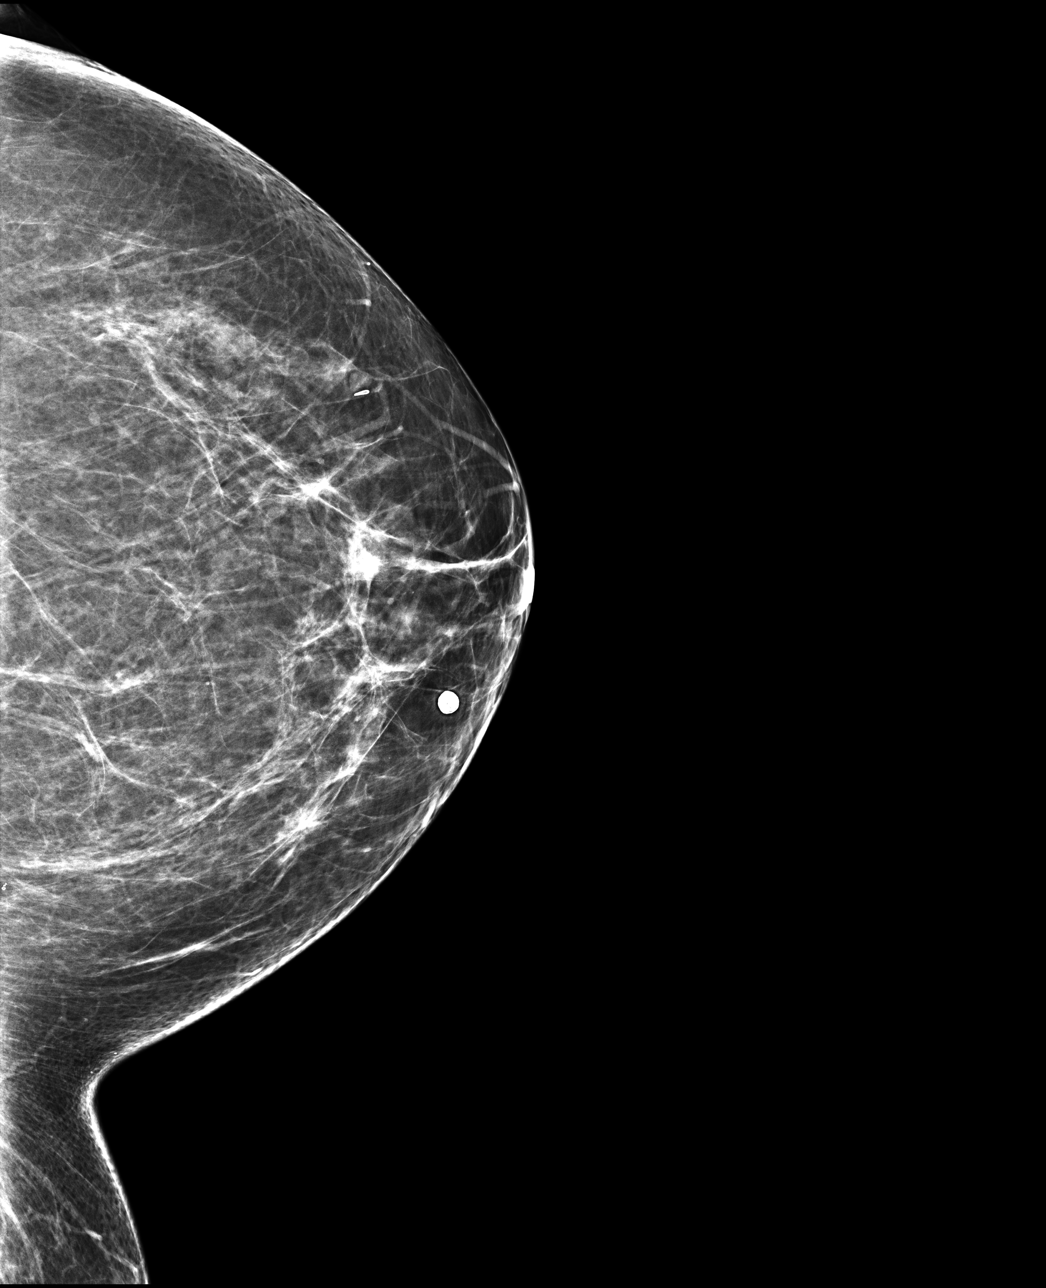

[L MLO]
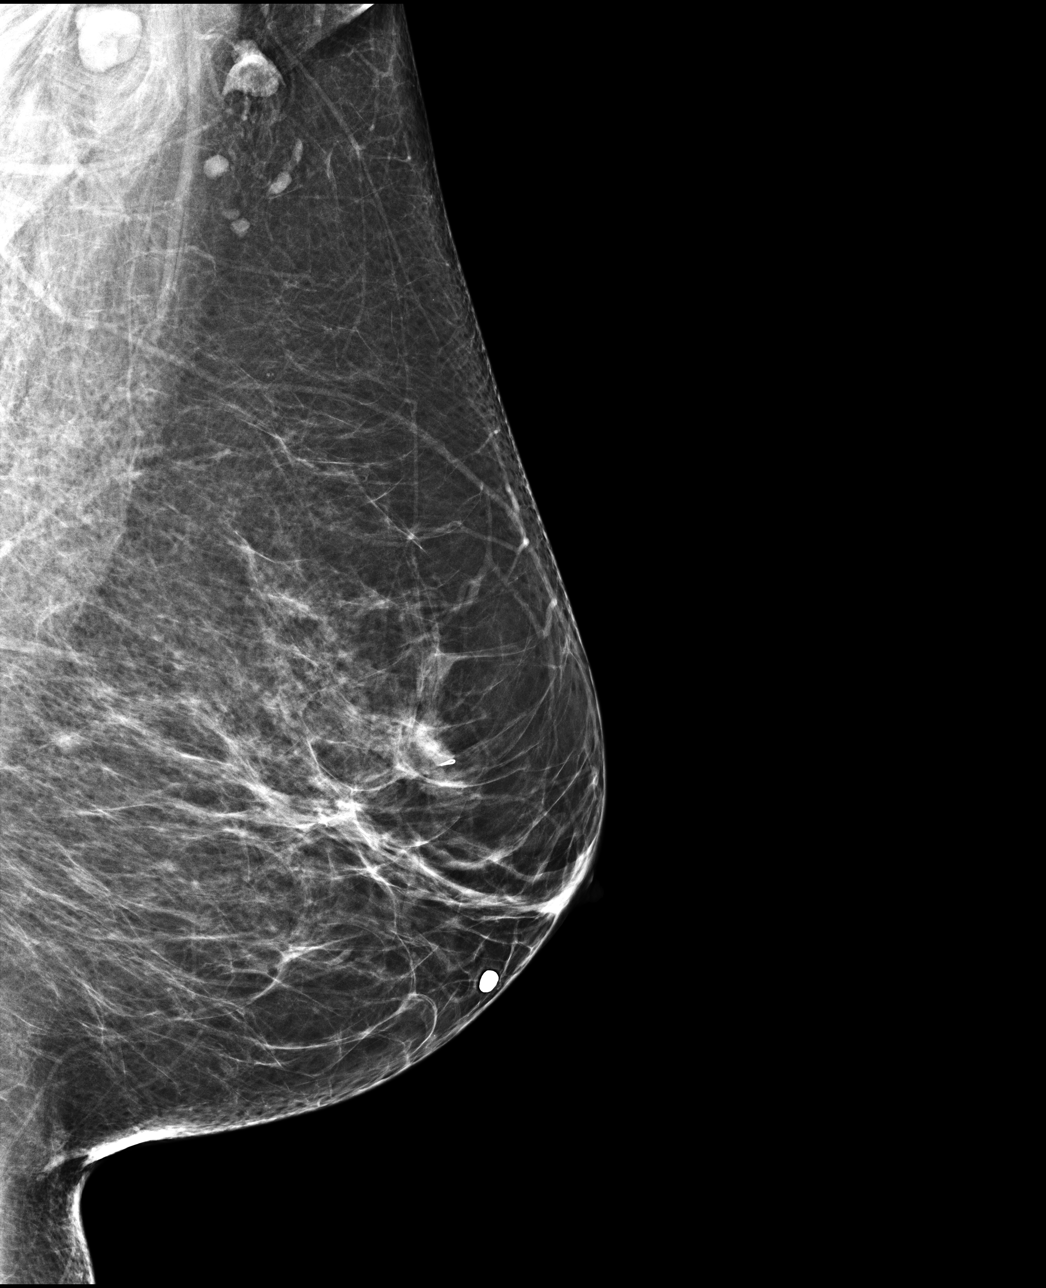

[R MLO]
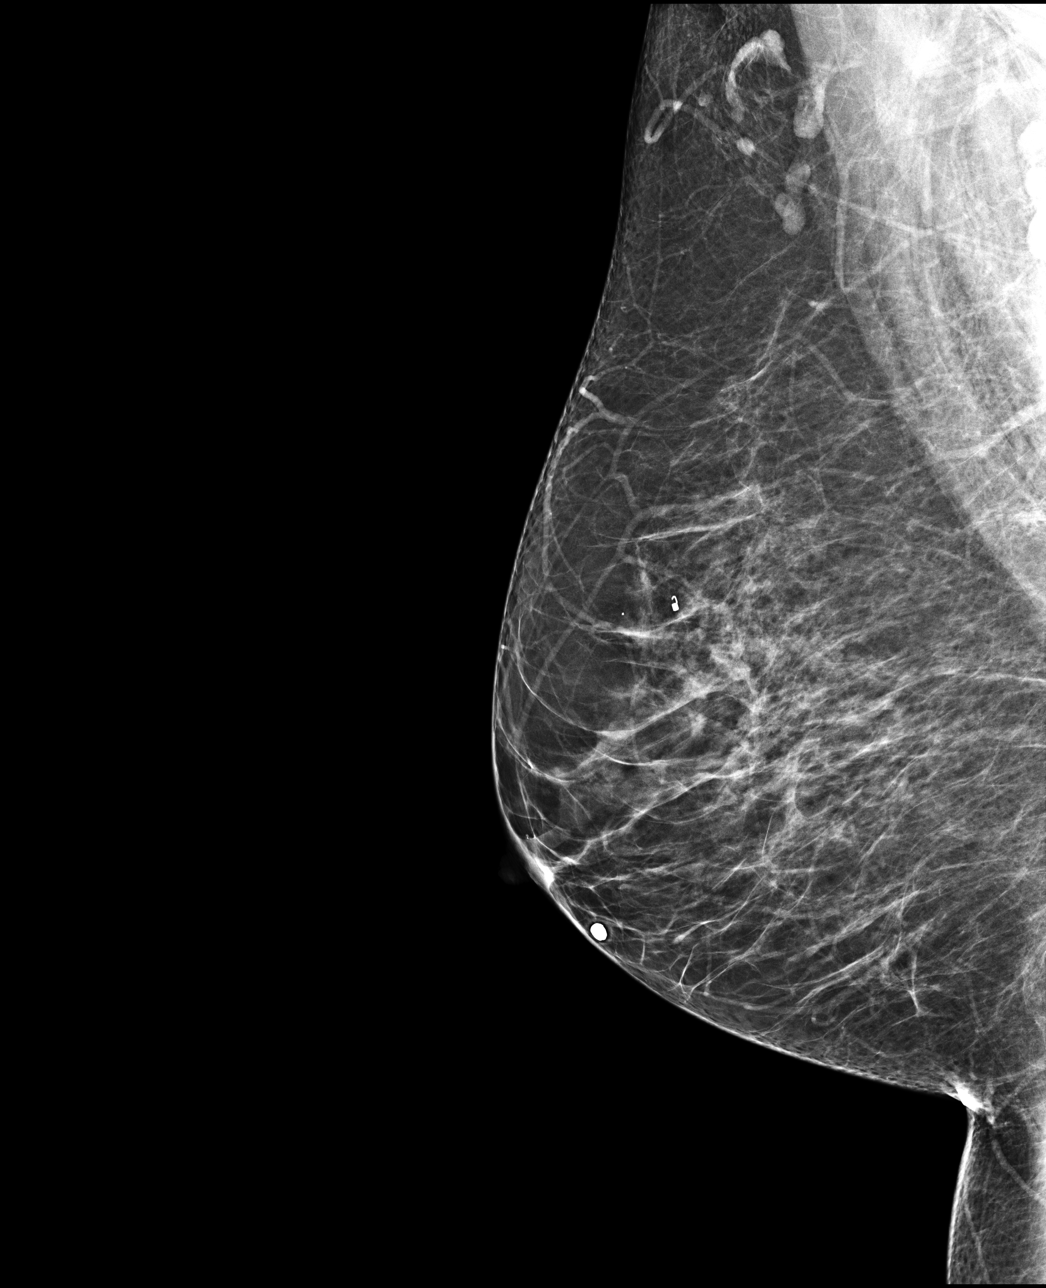

[4 of 4 positions shown; findings below may reference images not displayed]

ACR Breast Density Category b: There are scattered areas of
fibroglandular density.
FINDINGS: There are no findings suspicious for malignancy. Images were
processed with CAD.
IMPRESSION: No mammographic evidence of malignancy. A result letter of this
screening mammogram will be mailed directly to the patient.

RECOMMENDATION:
Screening mammogram in one year. (Code:AS-G-LCT)

BI-RADS CATEGORY  1: Negative.

## 2020-05-06 DIAGNOSIS — M1711 Unilateral primary osteoarthritis, right knee: Secondary | ICD-10-CM | POA: Diagnosis not present

## 2020-05-06 DIAGNOSIS — Z7689 Persons encountering health services in other specified circumstances: Secondary | ICD-10-CM | POA: Diagnosis not present

## 2020-05-14 DIAGNOSIS — R0789 Other chest pain: Secondary | ICD-10-CM | POA: Diagnosis not present

## 2020-05-14 DIAGNOSIS — R079 Chest pain, unspecified: Secondary | ICD-10-CM | POA: Diagnosis not present

## 2020-05-15 DIAGNOSIS — Z419 Encounter for procedure for purposes other than remedying health state, unspecified: Secondary | ICD-10-CM | POA: Diagnosis not present

## 2020-06-14 DIAGNOSIS — Z419 Encounter for procedure for purposes other than remedying health state, unspecified: Secondary | ICD-10-CM | POA: Diagnosis not present

## 2020-06-18 DIAGNOSIS — Z7689 Persons encountering health services in other specified circumstances: Secondary | ICD-10-CM | POA: Diagnosis not present

## 2020-06-18 DIAGNOSIS — M25561 Pain in right knee: Secondary | ICD-10-CM | POA: Diagnosis not present

## 2020-06-18 DIAGNOSIS — M25562 Pain in left knee: Secondary | ICD-10-CM | POA: Diagnosis not present

## 2020-06-27 ENCOUNTER — Telehealth: Payer: Self-pay

## 2020-06-27 NOTE — Telephone Encounter (Signed)
Spoke w/patient. She reports she is still having sweats. She is sweating profusely. It doesn't seem to be working.

## 2020-06-27 NOTE — Telephone Encounter (Signed)
Patient is calling back to due to missed call . Please advise

## 2020-06-27 NOTE — Telephone Encounter (Signed)
Spoke to patient. Advised RPH was in OR this a.m. and had completed his cases prior to her message. He is out of the office tomorrow and will return on Monday.

## 2020-06-27 NOTE — Telephone Encounter (Signed)
Patient calling about  cloNIDine (CATAPRES) 0.1 MG tablet   RPH rx'd for her sweats.   [phone]

## 2020-06-28 ENCOUNTER — Other Ambulatory Visit: Payer: Self-pay | Admitting: Obstetrics & Gynecology

## 2020-06-28 NOTE — Telephone Encounter (Signed)
Pt transferred from front desk. Inquiring if rx has been sent. Advised RPH out of office til Monday.

## 2020-06-28 NOTE — Telephone Encounter (Signed)
Pt aware. She would like to switch to patch entirely.

## 2020-06-28 NOTE — Telephone Encounter (Signed)
Let her know we can increase dose, and also can consider changing to patch as it may improve absorption and effectiveness.  There is also option of changing to new medicine such as hormone pill or patch entirely.   Let me know which way to go (increase dose, patch or no, or new medicine).

## 2020-06-28 NOTE — Telephone Encounter (Signed)
Patient is calling back and needs to speak with Crystal. Please advise

## 2020-06-29 ENCOUNTER — Other Ambulatory Visit: Payer: Self-pay | Admitting: Obstetrics & Gynecology

## 2020-06-29 DIAGNOSIS — R232 Flushing: Secondary | ICD-10-CM

## 2020-06-29 MED ORDER — CLONIDINE 0.2 MG/24HR TD PTWK: 0.2000 mg | MEDICATED_PATCH | TRANSDERMAL | 12 refills | Status: DC

## 2020-07-01 NOTE — Telephone Encounter (Signed)
Patient aware.

## 2020-07-03 ENCOUNTER — Telehealth: Payer: Self-pay

## 2020-07-03 NOTE — Telephone Encounter (Signed)
Pt calling; she got the patches; also has pills for sweats; is it okay to take both.  [phone]  Pt has the catapress patches; has clonidine 0.1mg  for sweats.  Is it okay to take the pills with the patch?  Pt aware PH is not in the office.

## 2020-07-04 NOTE — Telephone Encounter (Signed)
They are the same medicine.  I would try to patch by itself first for 6-8 weeks to see if that is all she needs.

## 2020-07-04 NOTE — Telephone Encounter (Signed)
Pt aware.

## 2020-07-09 DIAGNOSIS — M25561 Pain in right knee: Secondary | ICD-10-CM | POA: Diagnosis not present

## 2020-07-09 DIAGNOSIS — Z7689 Persons encountering health services in other specified circumstances: Secondary | ICD-10-CM | POA: Diagnosis not present

## 2020-07-15 DIAGNOSIS — Z419 Encounter for procedure for purposes other than remedying health state, unspecified: Secondary | ICD-10-CM | POA: Diagnosis not present

## 2020-07-22 ENCOUNTER — Ambulatory Visit: Payer: Medicaid Other | Admitting: Family Medicine

## 2020-07-23 DIAGNOSIS — M1711 Unilateral primary osteoarthritis, right knee: Secondary | ICD-10-CM | POA: Diagnosis not present

## 2020-07-23 DIAGNOSIS — Z7689 Persons encountering health services in other specified circumstances: Secondary | ICD-10-CM | POA: Diagnosis not present

## 2020-07-29 ENCOUNTER — Other Ambulatory Visit: Payer: Self-pay | Admitting: Obstetrics & Gynecology

## 2020-07-29 MED ORDER — ESTRADIOL 0.05 MG/24HR TD PTWK: 0.0500 mg | MEDICATED_PATCH | TRANSDERMAL | 11 refills | Status: DC

## 2020-07-29 NOTE — Telephone Encounter (Signed)
Patient requesting return call. No details given. [phone] This encounter was created in error - please disregard.

## 2020-07-29 NOTE — Progress Notes (Signed)
Change in therapy from Clonidine to Climara (ERT) for hot flashes and sweats. Discussed pros and cons of hormones.  Barnett Applebaum, MD, [facility], [facility] 07/29/2020  2:59 PM

## 2020-07-29 NOTE — Telephone Encounter (Signed)
Patient states patch isn't helping at all. It's actually worse. CL#[phone]

## 2020-08-14 DIAGNOSIS — Z419 Encounter for procedure for purposes other than remedying health state, unspecified: Secondary | ICD-10-CM | POA: Diagnosis not present

## 2020-08-30 DIAGNOSIS — Z7689 Persons encountering health services in other specified circumstances: Secondary | ICD-10-CM | POA: Diagnosis not present

## 2020-09-11 ENCOUNTER — Ambulatory Visit: Payer: Medicaid Other | Admitting: Family Medicine

## 2020-09-14 DIAGNOSIS — Z419 Encounter for procedure for purposes other than remedying health state, unspecified: Secondary | ICD-10-CM | POA: Diagnosis not present

## 2020-09-24 IMAGING — CR DG FEMUR 2+V*L*
4 series · 4 of 4 positions shown · non-contrast
Comparison: Left knee radiographs 12/08/2017.

CLINICAL DATA: Left femur pain after falling 2 days ago.

EXAM:
LEFT FEMUR 2 VIEWS

[femur ap (1 of 2)]
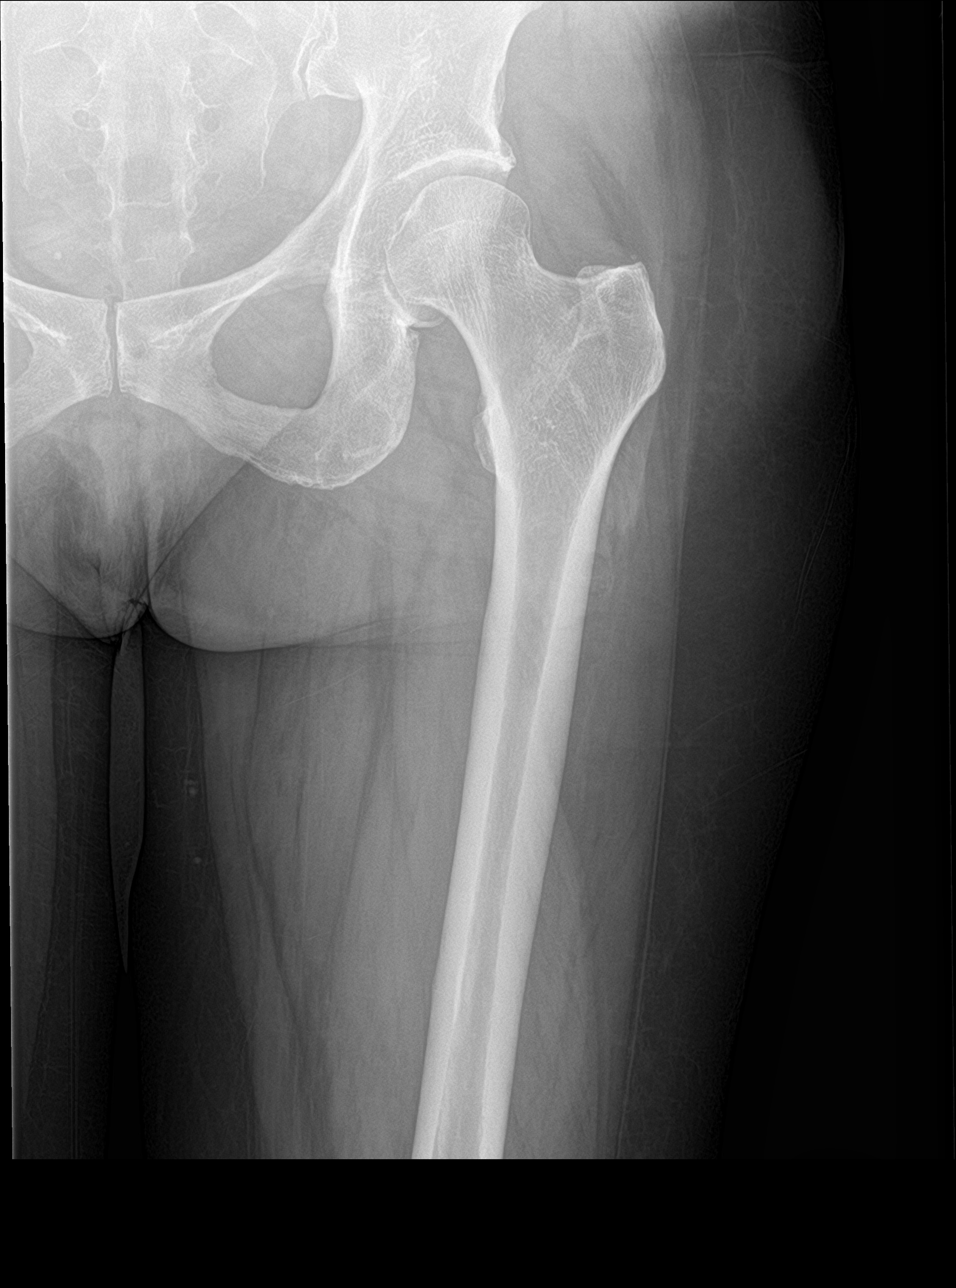

[femur ap (2 of 2)]
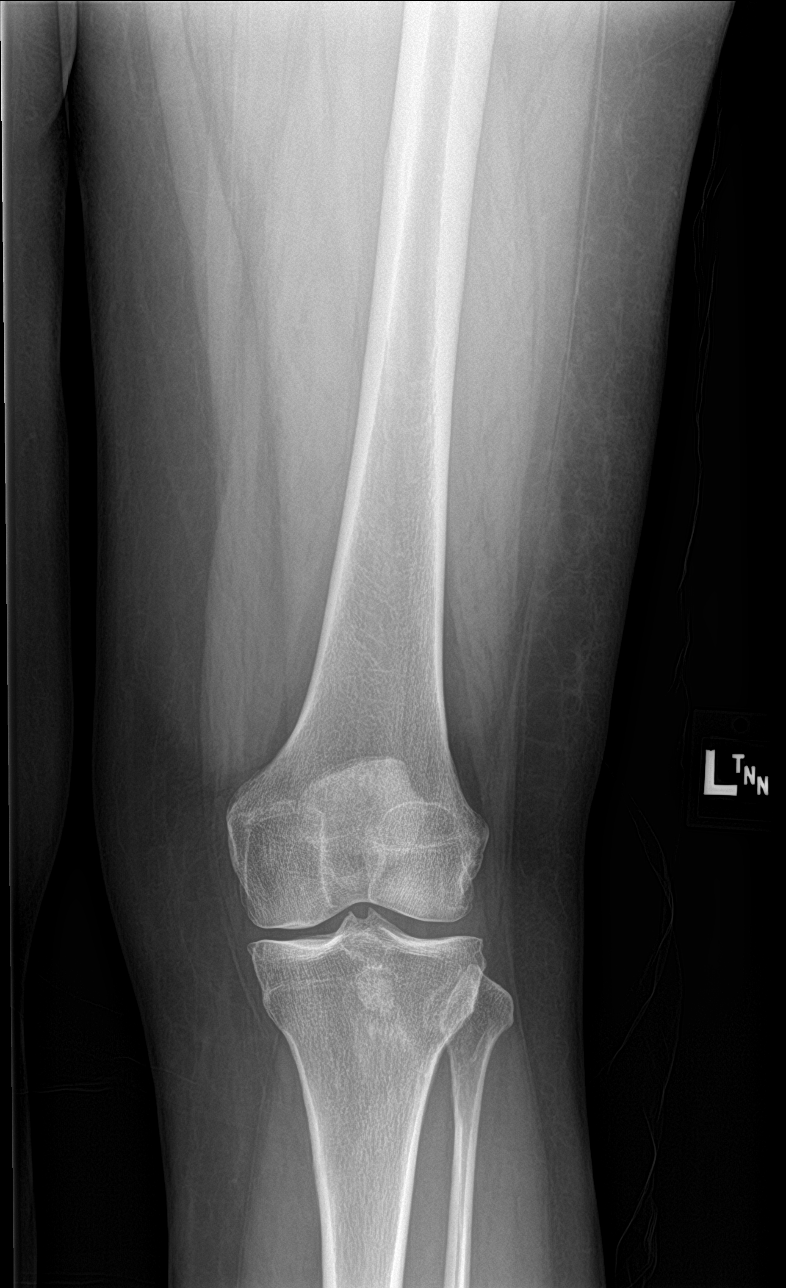

[femur lat (1 of 2)]
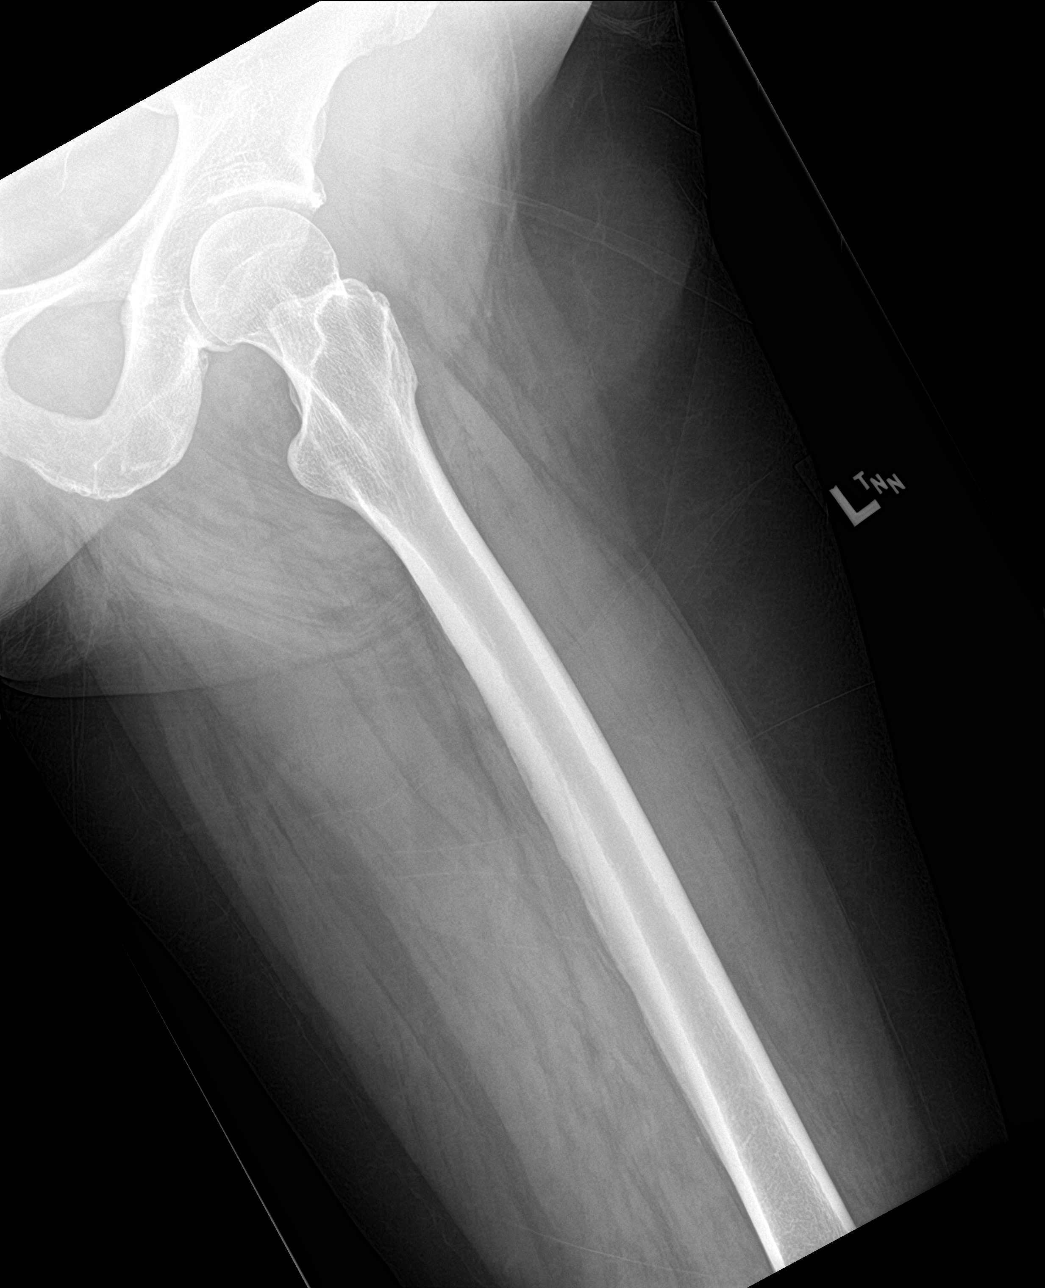

[femur lat (2 of 2)]
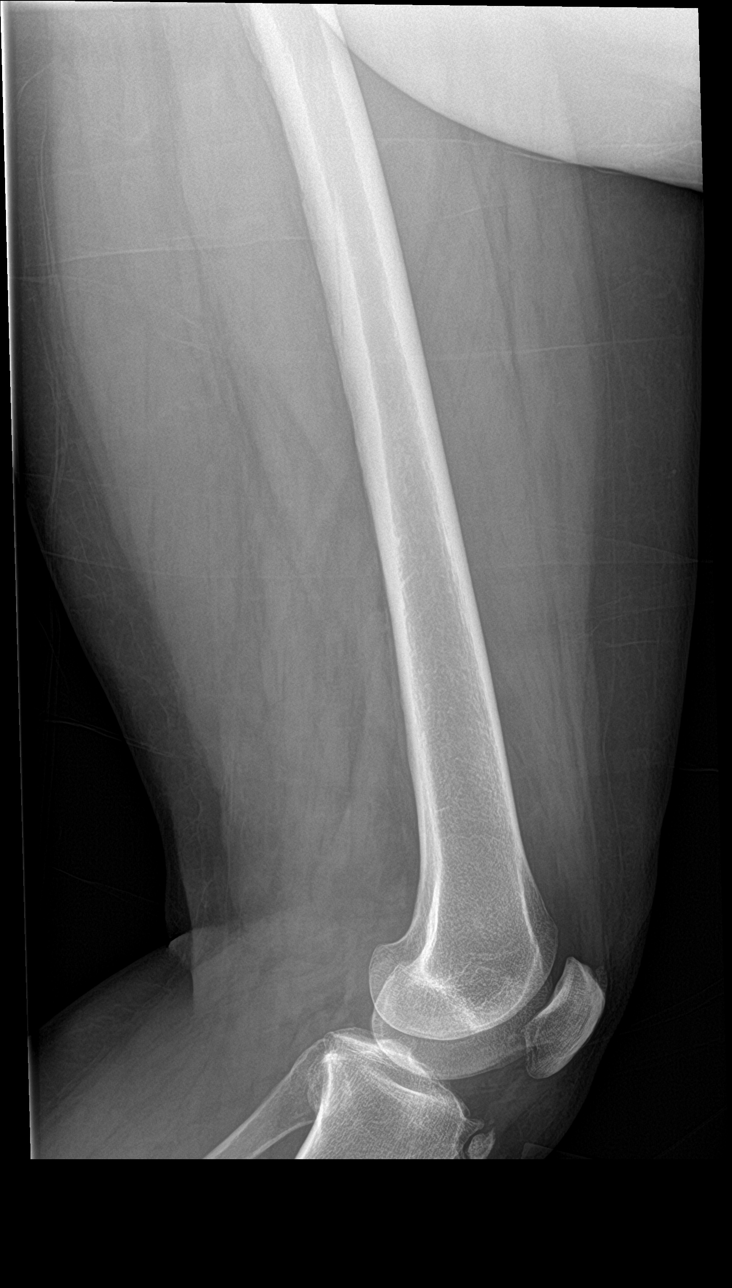

[4 of 4 positions shown; findings below may reference images not displayed]

FINDINGS: The mineralization and alignment are normal. There is no evidence of
acute fracture or dislocation. There is stable fragmentation of the
tibial tubercle. No significant knee joint effusion or arthropathy
at the hip or knee.
IMPRESSION: No acute osseous findings. Stable fragmentation of the tibial
tubercle.

## 2020-10-04 ENCOUNTER — Ambulatory Visit: Payer: Medicaid Other | Admitting: Family Medicine

## 2020-10-11 DIAGNOSIS — H2513 Age-related nuclear cataract, bilateral: Secondary | ICD-10-CM | POA: Diagnosis not present

## 2020-10-11 DIAGNOSIS — H5213 Myopia, bilateral: Secondary | ICD-10-CM | POA: Diagnosis not present

## 2020-10-11 DIAGNOSIS — Z7689 Persons encountering health services in other specified circumstances: Secondary | ICD-10-CM | POA: Diagnosis not present

## 2020-10-15 DIAGNOSIS — Z419 Encounter for procedure for purposes other than remedying health state, unspecified: Secondary | ICD-10-CM | POA: Diagnosis not present

## 2020-10-15 DIAGNOSIS — M1711 Unilateral primary osteoarthritis, right knee: Secondary | ICD-10-CM | POA: Diagnosis not present

## 2020-10-15 DIAGNOSIS — Z7689 Persons encountering health services in other specified circumstances: Secondary | ICD-10-CM | POA: Diagnosis not present

## 2020-10-21 ENCOUNTER — Encounter: Payer: Self-pay | Admitting: Family Medicine

## 2020-10-21 ENCOUNTER — Ambulatory Visit: Payer: Medicaid Other | Admitting: Family Medicine

## 2020-10-21 ENCOUNTER — Other Ambulatory Visit: Payer: Self-pay

## 2020-10-21 VITALS — BP 124/82 | HR 92 | Temp 98.1°F | Resp 16 | Ht 62.0 in | Wt 168.0 lb

## 2020-10-21 DIAGNOSIS — Z6830 Body mass index (BMI) 30.0-30.9, adult: Secondary | ICD-10-CM | POA: Diagnosis not present

## 2020-10-21 DIAGNOSIS — E039 Hypothyroidism, unspecified: Secondary | ICD-10-CM | POA: Diagnosis not present

## 2020-10-21 DIAGNOSIS — E669 Obesity, unspecified: Secondary | ICD-10-CM

## 2020-10-21 DIAGNOSIS — Z5181 Encounter for therapeutic drug level monitoring: Secondary | ICD-10-CM | POA: Diagnosis not present

## 2020-10-21 DIAGNOSIS — I10 Essential (primary) hypertension: Secondary | ICD-10-CM

## 2020-10-21 DIAGNOSIS — M25561 Pain in right knee: Secondary | ICD-10-CM

## 2020-10-21 DIAGNOSIS — F331 Major depressive disorder, recurrent, moderate: Secondary | ICD-10-CM | POA: Diagnosis not present

## 2020-10-21 DIAGNOSIS — N951 Menopausal and female climacteric states: Secondary | ICD-10-CM

## 2020-10-21 DIAGNOSIS — Z7689 Persons encountering health services in other specified circumstances: Secondary | ICD-10-CM | POA: Diagnosis not present

## 2020-10-21 NOTE — Patient Instructions (Signed)
Here are some resources to help you if you feel you are in a mental health crisis:  [facility] - Call [phone]  for help - Website with more resources: [email or web address].[email or web address]  [place] - Call to make an appointment   - Address: [address], [place] - Telephone: [phone]  - Hours of Operation: Sunday - Saturday - 8:00 a.m. - 8:00 p.m. - Medicaid, Medicare (Government Issued Only), BCBS, and [employer] - Pay - Crisis Management, [place], Psychiatrists on-site to provide medication management, [place], and Peer Support Care.

## 2020-10-21 NOTE — Progress Notes (Unsigned)
Name: Kristina Reed   MRN: [id number]    DOB: [birth year]   Date:10/21/2020       Progress Note  Chief Complaint  Patient presents with  . Follow-up  . Depression  . Hypothyroidism     Subjective:   Kristina Reed is a 55 y.o. female, presents to clinic for routine f/up   Hypothyroidism: History: hypothyroidism Current Medication Regimen: 75 mcg synthroid Takes medicine daily Current Symptoms: denies fatigue, weight changes, heat/cold intolerance, bowel/skin changes or CVS symptoms Most recent results are below; we will be repeating labs today. Lab Results  Component Value Date   TSH 2.20 01/19/2020   Menopause and estradiol replacement - managed by Dr. Kenton Kingfisher - estrogen patch and clonidine patch   Right knee pain -seeing Dr. Harlow Mares - recently got steroids, wearing a knee prace  Wt Readings from Last 5 Encounters:  10/21/20 168 lb (76.2 kg)  04/16/20 155 lb (70.3 kg)  03/12/20 154 lb (69.9 kg)  02/06/20 155 lb 4 oz (70.4 kg)  01/19/20 153 lb 3.2 oz (69.5 kg)   BMI Readings from Last 5 Encounters:  10/21/20 30.73 kg/m  04/16/20 28.81 kg/m  03/12/20 28.17 kg/m  02/06/20 28.40 kg/m  01/19/20 28.02 kg/m   Depression/anxiety:  On prozac 20 mg daily, moods good per patient - but score worse - reviewed phq9 and gad 7 today with the pt, she notes that sometimes she gets sad about her dads death and she misses him, but mostly she just deals with it and moves on with her day, she denies SI, HI, self harm.  She doesn't really want to change meds or talk to a psychiatrist or therapist Depression screen [facility] 2/9 10/21/2020 03/12/2020 01/19/2020  Decreased Interest 3 0 0  Down, Depressed, Hopeless 2 0 0  PHQ - 2 Score 5 0 0  Altered sleeping 1 0 0  Tired, decreased energy 1 0 0  Change in appetite 0 0 0  Feeling bad or failure about yourself  0 0 0  Trouble concentrating 1 0 0  Moving slowly or fidgety/restless 3 0 0  Suicidal thoughts 0 0 0  PHQ-9 Score 11 0 0  Difficult  doing work/chores Not difficult at all Not difficult at all Not difficult at all  Some recent data might be hidden   GAD 7 : Generalized Anxiety Score 10/21/2020 03/12/2020  Nervous, Anxious, on Edge 0 0  Control/stop worrying 0 0  Worry too much - different things 3 0  Trouble relaxing 0 0  Restless 0 0  Easily annoyed or irritable 0 0  Afraid - awful might happen 0 0  Total GAD 7 Score 3 0  Anxiety Difficulty Not difficult at all Not difficult at all          Current Outpatient Medications:  .  estradiol (CLIMARA - DOSED IN MG/24 HR) 0.05 mg/24hr patch, Place 1 patch (0.05 mg total) onto the skin once a week., Disp: 4 patch, Rfl: 11 .  FLUoxetine (PROZAC) 20 MG capsule, Take 1 capsule (20 mg total) by mouth daily., Disp: 90 capsule, Rfl: 3 .  levothyroxine (SYNTHROID) 75 MCG tablet, Take 1 tablet (75 mcg total) by mouth daily before breakfast., Disp: 90 tablet, Rfl: 3 .  meloxicam (MOBIC) 15 MG tablet, Take 1 tablet (15 mg total) by mouth daily as needed for pain., Disp: 90 tablet, Rfl: 1 .  linaclotide (LINZESS) 290 MCG CAPS capsule, Take 1 capsule (290 mcg total) by mouth daily before  breakfast. (Patient not taking: Reported on 10/21/2020), Disp: 90 capsule, Rfl: 0 .  traMADol (ULTRAM) 50 MG tablet, Take by mouth every 6 (six) hours as needed. (Patient not taking: Reported on 10/21/2020), Disp: , Rfl:   Patient Active Problem List   Diagnosis Date Noted  . Grade I internal hemorrhoids 11/16/2019  . Genetic testing 04/20/2019  . Family history of breast cancer   . History of cervical cancer   . Mastalgia 03/17/2019  . Family hx-breast malignancy 12/16/2018  . Essential hypertension 01/19/2018  . Obesity (BMI 30.0-34.9) 01/19/2018  . Preventative health care 09/03/2017  . Eosinophilic esophagitis 74/04/1447  . Apophysitis 08/26/2017  . 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 Pain in joint, multiple sites 07/05/2017  . Osteoarthritis of knee 07/05/2017  . Lichen sclerosus et  atrophicus 04/27/2017  . Headache 04/27/2017  . Headache disorder 02/17/2017  . Post-concussion headache 02/17/2017  . Medication monitoring encounter 09/21/2016  . Hematuria 04/23/2016  . Hx of cervical cancer 01/30/2016  . Menopause 01/30/2016  . Status post bilateral breast biopsy 01/07/2016  . Screening for STD (sexually transmitted disease) 10/25/2015  . Rectal bleeding 04/30/2015  . Right knee pain 07/23/2014  . Depression, major, recurrent, moderate ([place]) 02/02/2014  . Adult hypothyroidism 02/02/2014  . Obstructive apnea 02/02/2014  . Anxiety and depression 02/02/2014  . Incomplete bladder emptying 12/12/2012  . Tenosynovitis of foot 05/30/2012  . Benign neoplasm of kidney 05/13/2012  . Urge incontinence 05/13/2012  . FOM (frequency of micturition) 05/13/2012  . Chronic pain of right hand 05/05/2012  . Tarsal tunnel syndrome 03/03/2012    Past Surgical History:  Procedure Laterality Date  . ABDOMINAL HYSTERECTOMY     partial hysterectomy  . BREAST BIOPSY Bilateral 01/01/2016   Fibroadenoma  . COLONOSCOPY WITH PROPOFOL N/A 06/27/2015   Procedure: COLONOSCOPY WITH PROPOFOL;  Surgeon: Josefine Class, MD;  Location[facility] ENDOSCOPY;  Service: Endoscopy;  Laterality: N/A;  . COLONOSCOPY WITH PROPOFOL N/A 09/23/2017   Procedure: COLONOSCOPY WITH PROPOFOL;  Surgeon: Jonathon Bellows, MD;  Location: [facility] ENDOSCOPY;  Service: Gastroenterology;  Laterality: N/A;  . ESOPHAGOGASTRODUODENOSCOPY (EGD) WITH PROPOFOL N/A 07/22/2017   Procedure: ESOPHAGOGASTRODUODENOSCOPY (EGD) WITH PROPOFOL;  Surgeon: Jonathon Bellows, MD;  Location: [facility] ENDOSCOPY;  Service: Gastroenterology;  Laterality: N/A;  . ESOPHAGOGASTRODUODENOSCOPY (EGD) WITH PROPOFOL N/A 03/31/2018   Procedure: ESOPHAGOGASTRODUODENOSCOPY (EGD) WITH PROPOFOL;  Surgeon: Lin Landsman, MD;  Location: [facility] ENDOSCOPY;  Service: Gastroenterology;  Laterality: N/A;  . KNEE ARTHROSCOPY Right 07/22/2016   Procedure: ARTHROSCOPY KNEE  debridement microfracture;  Surgeon: Leanor Kail, MD;  Location: [facility] ORS;  Service: Orthopedics;  Laterality: Right;  . KNEE ARTHROSCOPY WITH MEDIAL MENISECTOMY Left 11/10/2017   Procedure: KNEE ARTHROSCOPY WITH PARTIAL MEDIAL MENISECTOMY&patella femoral debridement, & ablation;  Surgeon: Leanor Kail, MD;  Location: [facility] ORS;  Service: Orthopedics;  Laterality: Left;  . TUBAL LIGATION      Family History  Problem Relation Age of Onset  . Diabetes Mother   . Breast cancer Maternal Grandmother   . Cancer Paternal Grandmother        cancer?  . Cirrhosis Cousin   . Breast cancer Other        x4    Social History   Tobacco Use  . Smoking status: Never Smoker  . Smokeless tobacco: Never Used  Vaping Use  . Vaping Use: Never used  Substance Use Topics  . Alcohol use: Yes    Comment: twice a week  . Drug use: Not Currently     Allergies  Allergen Reactions  . Percocet [Oxycodone-Acetaminophen] Palpitations    Health Maintenance  Topic Date Due  . INFLUENZA VACCINE  12/12/2020 (Originally 04/14/2020)  . MAMMOGRAM  03/07/2021  . PAP SMEAR-Modifier  04/17/2023  . TETANUS/TDAP  09/02/2026  . COLONOSCOPY (Pts 45-43yrs Insurance coverage will need to be confirmed)  09/24/2027  . Hepatitis C Screening  Completed  . HIV Screening  Completed    Chart Review Today: I personally reviewed active problem list, medication list, allergies, family history, social history, health maintenance, notes from last encounter, lab results, imaging with the patient/caregiver today.   Review of Systems  Constitutional: Negative.   HENT: Negative.   Eyes: Negative.   Respiratory: Negative.   Cardiovascular: Negative.   Gastrointestinal: Negative.   Endocrine: Negative.   Genitourinary: Negative.   Musculoskeletal: Negative.   Skin: Negative.   Allergic/Immunologic: Negative.   Neurological: Negative.   Hematological: Negative.   Psychiatric/Behavioral: Negative.  Negative for  agitation, decreased concentration, dysphoric mood, self-injury, sleep disturbance and suicidal ideas. The patient is not nervous/anxious.   All other systems reviewed and are negative.    Objective:   Vitals:   10/21/20 1356  BP: 124/82  Pulse: 92  Resp: 16  Temp: 98.1 F (36.7 C)  SpO2: 99%  Weight: 168 lb (76.2 kg)  Height: 5\' 2"  (1.575 m)    Body mass index is 30.73 kg/m.  Physical Exam Vitals and nursing note reviewed.  Constitutional:      General: She is not in acute distress.    Appearance: Normal appearance. She is well-developed. She is obese. She is not ill-appearing, toxic-appearing or diaphoretic.     Interventions: Face mask in place.  HENT:     Head: Normocephalic and atraumatic.     Right Ear: External ear normal.     Left Ear: External ear normal.  Eyes:     General: Lids are normal. No scleral icterus.       Right eye: No discharge.        Left eye: No discharge.     Conjunctiva/sclera: Conjunctivae normal.  Neck:     Trachea: Phonation normal. No tracheal deviation.  Cardiovascular:     Rate and Rhythm: Normal rate and regular rhythm.     Pulses: Normal pulses.          Radial pulses are 2+ on the right side and 2+ on the left side.       Posterior tibial pulses are 2+ on the right side and 2+ on the left side.     Heart sounds: Normal heart sounds. No murmur heard. No friction rub. No gallop.   Pulmonary:     Effort: Pulmonary effort is normal. No respiratory distress.     Breath sounds: Normal breath sounds. No stridor. No wheezing, rhonchi or rales.  Chest:     Chest wall: No tenderness.  Abdominal:     General: Bowel sounds are normal. There is no distension.     Palpations: Abdomen is soft.     Tenderness: There is no abdominal tenderness. There is no right CVA tenderness, left CVA tenderness or guarding.  Musculoskeletal:     Right lower leg: No edema.     Left lower leg: No edema.  Skin:    General: Skin is warm and dry.      Capillary Refill: Capillary refill takes less than 2 seconds.     Coloration: Skin is not jaundiced or pale.     Findings: No rash.  Neurological:     Mental Status: She is alert. Mental status is at baseline.     Motor: No abnormal muscle tone.     Gait: Gait normal.  Psychiatric:        Attention and Perception: Attention normal.        Mood and Affect: Mood and affect normal.        Speech: Speech normal.        Behavior: Behavior normal. Behavior is cooperative.        Thought Content: Thought content does not include homicidal or suicidal ideation. Thought content does not include homicidal or suicidal plan.         Assessment & Plan:     ICD-10-CM   1. Depression, major, recurrent, moderate ([place])  F33.1 Ambulatory referral to Psychiatry   PHQ-9 score positive discussed with patient she would like to continue same medicines and does not want to talk to psychiatry or therapist right now  2. Adult hypothyroidism  E03.9 TSH   No signs of chemical hyper or hypothyroid she is improved her med compliance with levothyroxine, check labs and adjust medication as needed  3. Essential hypertension  I10    History of hypertension, currently managed with diet lifestyle, blood pressure at goal today  4. Right knee pain, unspecified chronicity  M25.561    Managed by specialist she has been treating with steroid burst, Mobic and wearing a brace  5. Medication monitoring encounter  Z51.81   6. Hot flashes due to menopause  N95.1    She has clonidine patches, pills and estradiol patches encourage her to follow-up with GYN to clarify current management -per [facility] GYN Dr. Kenton Kingfisher  7. Class 1 obesity with body mass index (BMI) of 30.0 to 30.9 in adult, unspecified obesity type, unspecified whether serious comorbidity present  E66.9    Z68.30    Currently has limitations with regards to physical exercise encouraged her to work on healthy diet and staying active as able   PHQ-9 positive  supportive listening done today she did expressed sadness and grief with the loss of her father many years ago. She denied suicidal ideations, homicidal ideations or any concerns of self-harm or thoughts of harming others I did encourage her to reach out to [place] to establish there to discuss her symptoms and to possibly establish with a therapist -although she did not want to talk to a therapist or psychiatrist right now I explained that if I put in the referral it will go through her insurance and if she needs it in the next few months she will be able to contact them herself and schedule  Contact information given for [facility]/explained there is a walk-in hours, she has address and phone number And additional psychiatric resource guide was given   Return for after 03/12/2021 for CPE/well woman.   Delsa Grana, PA-C 10/21/20 2:24 PM

## 2020-10-22 LAB — TSH: TSH: 1.71 mIU/L

## 2020-10-29 ENCOUNTER — Telehealth: Payer: Self-pay

## 2020-10-29 ENCOUNTER — Ambulatory Visit: Payer: Self-pay | Admitting: *Deleted

## 2020-10-29 NOTE — Telephone Encounter (Signed)
Boyfriend, Kristina Reed calling in wanting to know what all her medicines are for.   I asked him if I should be talking with her about these medicines.  "She can't understand all this".   She was in the background but he answered the questions.   He would ask her if he did not know the answer to something I would ask.  I went through all the list of medications with him.   Clarifying and verifying what they were for and the dosages.   Everything matched up except the estridiol patch and Catapress patches.    He is going to call Dr. Teresa Coombs her ob-gyn to clarify those 2 medications since he is the one who prescribed them.   He is have knee surgery tomorrow and wanted to be sure "my lady was going to be ok with her medicines".   "She's smart about things until it comes to all these medications".   He thanked me very very much for taking the time to help him.  "It's so good to talk with someone who is nice".   I let him know I was glad I could help and hope that things go well with his surgery tomorrow.    Reason for Disposition . Caller has medicine question only, adult not sick, AND triager answers question  Answer Assessment - Initial Assessment Questions 1. NAME of MEDICATION: "What medicine are you calling about?"     Boy friend calling in.   Kristina Reed. I don't know what all this medicine is for.   Can you tell me what all these medicines are? She has patches and several pill bottles here that were delivered today. I am for a total knee surgery in the morning.   I'm just wondering if they are going to make her dizzy.   I won't be here and I'm concerned about her being on all these medications. 2. QUESTION: "What is your question?" (e.g., medication refill, side effect)     What are all these medications for? 3. PRESCRIBING HCP: "Who prescribed it?" Reason: if prescribed by specialist, call should be referred to that group.     Delsa Grana, PA-C 4. SYMPTOMS: "Do you have any symptoms?"      I read these can make her dizzy.    5. SEVERITY: If symptoms are present, ask "Are they mild, moderate or severe?"     N/A 6. PREGNANCY:  "Is there any chance that you are pregnant?" "When was your last menstrual period?"     N/A due to age  Protocols used: MEDICATION QUESTION CALL-A-AH

## 2020-10-29 NOTE — Telephone Encounter (Signed)
Pt and significant other, Jeneen Rinks calling; needs call back regarding medication; misunderstanding of how to take medication.  [phone]  Jeneen Rinks is concerned that pt is using the patch and the pills (clonidine).  Adv per [facility] note to stop the clonidine and use the patch once per week.

## 2020-11-07 NOTE — Progress Notes (Deleted)
11/08/2020 1:03 PM   Kristina Reed 1966/03/07 [id number]  Referring provider: Hubbard [facility], [address] [place],  [place] [address]  No chief complaint on file.  Urological history: 1. Urinary retention - per history - documented PVR's have been minimal - manages with [facility]  2. Urge incontinence - managed conservatively   3. Urethral diverticulum - seen on contrast CT 01/2019 - here is a C-shaped low-density posterior and right lateral to the urethra, measuring 20 x 13 mm.  HPI: CIERRIA Reed is a 55 y.o. female who presents today for a yearly follow up.  PMH: Past Medical History:  Diagnosis Date  . Anxiety   . Arthritis    right knee and right elbow  . Cancer ([place])    Cervical CA with partial hysterectomy.  . Cognitive impairment, mild, so stated   . Depression   . Diverticulosis   . Eosinophilic esophagitis 17/61/6073   Biopsy Dec 2018  . Esophageal dysphagia   . Family history of breast cancer   . Gallstones   . GERD (gastroesophageal reflux disease)   . History of cervical cancer   . Hx of cervical cancer 01/30/2016  . Hypertension   . Hypothyroidism   . Sleep apnea    does not use a C-PAP  . Status post partial hysterectomy    Due to Cervical CA  . Vaginal inclusion cyst     Surgical History: Past Surgical History:  Procedure Laterality Date  . ABDOMINAL HYSTERECTOMY     partial hysterectomy  . BREAST BIOPSY Bilateral 01/01/2016   Fibroadenoma  . COLONOSCOPY WITH PROPOFOL N/A 06/27/2015   Procedure: COLONOSCOPY WITH PROPOFOL;  Surgeon: Josefine Class, MD;  Location: [facility] ENDOSCOPY;  Service: Endoscopy;  Laterality: N/A;  . COLONOSCOPY WITH PROPOFOL N/A 09/23/2017   Procedure: COLONOSCOPY WITH PROPOFOL;  Surgeon: Jonathon Bellows, MD;  Location: [facility] ENDOSCOPY;  Service: Gastroenterology;  Laterality: N/A;  . ESOPHAGOGASTRODUODENOSCOPY (EGD) WITH PROPOFOL N/A 07/22/2017   Procedure: ESOPHAGOGASTRODUODENOSCOPY (EGD) WITH PROPOFOL;   Surgeon: Jonathon Bellows, MD;  Location: [facility] ENDOSCOPY;  Service: Gastroenterology;  Laterality: N/A;  . ESOPHAGOGASTRODUODENOSCOPY (EGD) WITH PROPOFOL N/A 03/31/2018   Procedure: ESOPHAGOGASTRODUODENOSCOPY (EGD) WITH PROPOFOL;  Surgeon: Lin Landsman, MD;  Location: [facility] ENDOSCOPY;  Service: Gastroenterology;  Laterality: N/A;  . KNEE ARTHROSCOPY Right 07/22/2016   Procedure: ARTHROSCOPY KNEE debridement microfracture;  Surgeon: Leanor Kail, MD;  Location: [facility] ORS;  Service: Orthopedics;  Laterality: Right;  . KNEE ARTHROSCOPY WITH MEDIAL MENISECTOMY Left 11/10/2017   Procedure: KNEE ARTHROSCOPY WITH PARTIAL MEDIAL MENISECTOMY&patella femoral debridement, & ablation;  Surgeon: Leanor Kail, MD;  Location: [facility] ORS;  Service: Orthopedics;  Laterality: Left;  . TUBAL LIGATION      Home Medications:  Allergies as of 11/08/2020      Reactions   Percocet [oxycodone-acetaminophen] Palpitations      Medication List       Accurate as of November 07, 2020  1:03 PM. If you have any questions, ask your nurse or doctor.        cloNIDine 0.1 MG tablet Commonly known as: CATAPRES Take 0.1 mg by mouth daily.   estradiol 0.05 mg/24hr patch Commonly known as: CLIMARA - Dosed in mg/24 hr Place 1 patch (0.05 mg total) onto the skin once a week.   FLUoxetine 20 MG capsule Commonly known as: PROZAC Take 1 capsule (20 mg total) by mouth daily.   levothyroxine 75 MCG tablet Commonly known as: SYNTHROID Take 1 tablet (75  mcg total) by mouth daily before breakfast.   meloxicam 15 MG tablet Commonly known as: MOBIC Take 1 tablet (15 mg total) by mouth daily as needed for pain.   methylPREDNISolone 4 MG Tbpk tablet Commonly known as: MEDROL DOSEPAK FOLLOW DIRECTIONS ON PACKAGE AS DIRECTED PER MD FOR SIX DAYS       Allergies:  Allergies  Allergen Reactions  . Percocet [Oxycodone-Acetaminophen] Palpitations    Family History: Family History  Problem Relation Age of Onset  .  Diabetes Mother   . Breast cancer Maternal Grandmother   . Cancer Paternal Grandmother        cancer?  . Cirrhosis Cousin   . Breast cancer Other        x4    Social History:  reports that she has never smoked. She has never used smokeless tobacco. She reports current alcohol use. She reports previous drug use.  ROS: Pertinent ROS in HPI  Physical Exam: LMP 05/09/1993 (Approximate)   Constitutional:  Well nourished. Alert and oriented, No acute distress. HEENT: [place] AT, moist mucus membranes.  Trachea midline, no masses. Cardiovascular: No clubbing, cyanosis, or edema. Respiratory: Normal respiratory effort, no increased work of breathing. GI: Abdomen is soft, non tender, non distended, no abdominal masses. Liver and spleen not palpable.  No hernias appreciated.  Stool sample for occult testing is not indicated.   GU: No CVA tenderness.  No bladder fullness or masses.  *** external genitalia, *** pubic hair distribution, no lesions.  Normal urethral meatus, no lesions, no prolapse, no discharge.   No urethral masses, tenderness and/or tenderness. No bladder fullness, tenderness or masses. *** vagina mucosa, *** estrogen effect, no discharge, no lesions, *** pelvic support, *** cystocele and *** rectocele noted.  No cervical motion tenderness.  Uterus is freely mobile and non-fixed.  No adnexal/parametria masses or tenderness noted.  Anus and perineum are without rashes or lesions.   ***  Skin: No rashes, bruises or suspicious lesions. Lymph: No cervical or inguinal adenopathy. Neurologic: Grossly intact, no focal deficits, moving all 4 extremities. Psychiatric: Normal mood and affect.   Laboratory Data: Lab Results  Component Value Date   WBC 5.5 03/12/2020   HGB 13.2 03/12/2020   HCT 39.0 03/12/2020   MCV 85.7 03/12/2020   PLT 160 03/12/2020    Lab Results  Component Value Date   CREATININE 0.77 03/12/2020    Lab Results  Component Value Date   TSH 1.71 10/21/2020        Component Value Date/Time   CHOL 190 03/12/2020 1109   HDL 71 03/12/2020 1109   CHOLHDL 2.7 03/12/2020 1109   VLDL 26 09/02/2016 0903   LDLCALC 94 03/12/2020 1109    Lab Results  Component Value Date   AST 21 03/12/2020   Lab Results  Component Value Date   ALT 24 03/12/2020    Urinalysis    Component Value Date/Time   COLORURINE YELLOW 06/30/2019 0000   APPEARANCEUR CLOUDY (A) 06/30/2019 0000   APPEARANCEUR Clear 09/22/2018 0854   LABSPEC 1.020 06/30/2019 0000   LABSPEC 1.019 08/29/2014 1506   PHURINE 7.5 06/30/2019 0000   GLUCOSEU NEGATIVE 06/30/2019 0000   GLUCOSEU Negative 08/29/2014 1506   HGBUR NEGATIVE 06/30/2019 0000   BILIRUBINUR NEGATIVE 02/08/2019 0423   BILIRUBINUR Negative 09/22/2018 0854   BILIRUBINUR Negative 08/29/2014 1506   KETONESUR NEGATIVE 06/30/2019 0000   PROTEINUR NEGATIVE 06/30/2019 0000   NITRITE NEGATIVE 06/30/2019 0000   LEUKOCYTESUR 3+ (A) 06/30/2019 0000   LEUKOCYTESUR  Negative 08/29/2014 1506    I have reviewed the labs.   Pertinent Imaging: *** I have independently reviewed the films.    Assessment & Plan:  ***  1. Urinary retention ***  2. Urge incontinence  ***  3. Urethral diverticulum - will schedule an appointment with Dr. Matilde Sprang   No follow-ups on file.  These notes generated with voice recognition software. I apologize for typographical errors.  Zara Council, PA-C  [facility] [address]  [place], [place] [address] [phone]

## 2020-11-08 ENCOUNTER — Ambulatory Visit: Payer: Self-pay | Admitting: Urology

## 2020-11-08 DIAGNOSIS — N3941 Urge incontinence: Secondary | ICD-10-CM

## 2020-11-08 DIAGNOSIS — R339 Retention of urine, unspecified: Secondary | ICD-10-CM

## 2020-11-08 DIAGNOSIS — N361 Urethral diverticulum: Secondary | ICD-10-CM

## 2020-11-11 ENCOUNTER — Encounter: Payer: Self-pay | Admitting: Urology

## 2020-11-12 DIAGNOSIS — Z419 Encounter for procedure for purposes other than remedying health state, unspecified: Secondary | ICD-10-CM | POA: Diagnosis not present

## 2020-11-13 DIAGNOSIS — G894 Chronic pain syndrome: Secondary | ICD-10-CM | POA: Diagnosis not present

## 2020-11-13 DIAGNOSIS — M1711 Unilateral primary osteoarthritis, right knee: Secondary | ICD-10-CM | POA: Diagnosis not present

## 2020-11-15 ENCOUNTER — Telehealth: Payer: Self-pay

## 2020-11-15 NOTE — Telephone Encounter (Signed)
Received 2 messages on answering machine with patient asking [name] to call her as soon as possible.  Attempted to call .  Left message that we had called and that the office was closed until MOnday.

## 2020-11-23 DIAGNOSIS — M1712 Unilateral primary osteoarthritis, left knee: Secondary | ICD-10-CM | POA: Diagnosis not present

## 2020-11-25 ENCOUNTER — Encounter: Payer: Self-pay | Admitting: Student in an Organized Health Care Education/Training Program

## 2020-11-26 ENCOUNTER — Telehealth: Payer: Self-pay | Admitting: Student in an Organized Health Care Education/Training Program

## 2020-11-26 ENCOUNTER — Encounter: Payer: Self-pay | Admitting: Student in an Organized Health Care Education/Training Program

## 2020-11-26 ENCOUNTER — Other Ambulatory Visit: Payer: Self-pay

## 2020-11-26 ENCOUNTER — Ambulatory Visit
Payer: Medicaid Other | Attending: Student in an Organized Health Care Education/Training Program | Admitting: Student in an Organized Health Care Education/Training Program

## 2020-11-26 DIAGNOSIS — G894 Chronic pain syndrome: Secondary | ICD-10-CM | POA: Insufficient documentation

## 2020-11-26 DIAGNOSIS — G8929 Other chronic pain: Secondary | ICD-10-CM | POA: Diagnosis not present

## 2020-11-26 DIAGNOSIS — M170 Bilateral primary osteoarthritis of knee: Secondary | ICD-10-CM

## 2020-11-26 DIAGNOSIS — M25562 Pain in left knee: Secondary | ICD-10-CM

## 2020-11-26 DIAGNOSIS — M25561 Pain in right knee: Secondary | ICD-10-CM

## 2020-11-26 MED ORDER — DICLOFENAC SODIUM 75 MG PO TBEC: 75.0000 mg | DELAYED_RELEASE_TABLET | Freq: Two times a day (BID) | ORAL | 2 refills | Status: DC

## 2020-11-26 NOTE — Telephone Encounter (Signed)
Patient called stating she is unable to get the diclofenac filled, insurance wont pay for it. Also she forgot to talk to you about left knee pain. Makes her fall a lot. [facility] has put a shot in Saturday.

## 2020-11-26 NOTE — Progress Notes (Signed)
Patient: Kristina Reed  Service Category: E/M  Provider: Gillis Santa, MD  DOB: 1966/04/21  DOS: 11/26/2020  Location: Office  MRN: [id number]  Setting: Ambulatory outpatient  Referring Provider: Delsa Grana, PA-C  Type: Established Patient  Specialty: Interventional Pain Management  PCP: Delsa Grana, PA-C  Location: Home  Delivery: TeleHealth     Virtual Encounter - Pain Management PROVIDER NOTE: Information contained herein reflects review and annotations entered in association with encounter. Interpretation of such information and data should be left to medically-trained personnel. Information provided to patient can be located elsewhere in the medical record under "Patient Instructions". Document created using STT-dictation technology, any transcriptional errors that may result from process are unintentional.    Contact & Pharmacy Preferred: [phone] Home: [phone] (home) Mobile: [phone] (mobile) E-mail: No e-mail address on record  [facility], [place] [address] [place] [address] Phone: [phone] Fax: [phone]   Pre-screening  Kristina Reed offered "in-person" vs "virtual" encounter. She indicated preferring virtual for this encounter.   Reason COVID-19*  Social distancing based on CDC and AMA recommendations.   I contacted Kristina Reed on 11/26/2020 via video conference.      I clearly identified myself as Gillis Santa, MD. I verified that I was speaking with the correct person using two identifiers (Name: Kristina Reed, and date of birth: March 11, 1966).  Consent I sought verbal advanced consent from Kristina Reed for virtual visit interactions. I informed Kristina Reed of possible security and privacy concerns, risks, and limitations associated with providing "not-in-person" medical evaluation and management services. I also informed Kristina Reed of the availability of "in-person" appointments. Finally, I informed her that there would be a  charge for the virtual visit and that she could be  personally, fully or partially, financially responsible for it. Kristina Reed expressed understanding and agreed to proceed.   Historic Elements   Kristina Reed is a 55 y.o. year old, female patient evaluated today after our last contact on Visit date not found. Kristina Reed  has a past medical history of Anxiety, Arthritis, Cancer ([place]), Cognitive impairment, mild, so stated, Depression, Diverticulosis, Eosinophilic esophagitis (10/62/6948), Esophageal dysphagia, Family history of breast cancer, Gallstones, GERD (gastroesophageal reflux disease), History of cervical cancer, cervical cancer (01/30/2016), Hypertension, Hypothyroidism, Sleep apnea, Status post partial hysterectomy, and Vaginal inclusion cyst. She also  has a past surgical history that includes Abdominal hysterectomy; Colonoscopy with propofol (N/A, 06/27/2015); Tubal ligation; Knee arthroscopy (Right, 07/22/2016); Esophagogastroduodenoscopy (egd) with propofol (N/A, 07/22/2017); Colonoscopy with propofol (N/A, 09/23/2017); Knee arthroscopy with medial menisectomy (Left, 11/10/2017); Esophagogastroduodenoscopy (egd) with propofol (N/A, 03/31/2018); and Breast biopsy (Bilateral, 01/01/2016). Kristina Reed has a current medication list which includes the following prescription(s): clonidine, diclofenac, estradiol, fluoxetine, levothyroxine, and methylprednisolone. She  reports that she has never smoked. She has never used smokeless tobacco. She reports current alcohol use. She reports previous drug use. Kristina Reed is allergic to percocet [oxycodone-acetaminophen].   HPI  Today, she is being contacted for worsening of previously known (established) problem   Worsening knee pain, has been seeing [facility] for left knee pain. Received LEFT IA knee steroid injection at [facility] this past Saturday. Has had genicular nerve block with me in the past which was not effective so did not go through with RFA. History  of comorbid depression and anxiety as well, managed by PCP   Laboratory Chemistry Profile   Renal Lab Results  Component Value Date  BUN 17 03/12/2020   CREATININE 0.77 41/96/2229   BCR NOT APPLICABLE 79/89/2119   GFRAA 104 03/12/2020   GFRNONAA 89 03/12/2020     Hepatic Lab Results  Component Value Date   AST 21 03/12/2020   ALT 24 03/12/2020   ALBUMIN 3.7 02/08/2019   ALKPHOS 79 02/08/2019   LIPASE 22 02/08/2019     Electrolytes Lab Results  Component Value Date   NA 136 03/12/2020   K 4.1 03/12/2020   CL 98 03/12/2020   CALCIUM 9.1 03/12/2020     Bone No results found for: VD25OH, [id number], EH6314HF0, YO3785YI5, 25OHVITD1, 25OHVITD2, 25OHVITD3, TESTOFREE, TESTOSTERONE   Inflammation (CRP: Acute Phase) (ESR: Chronic Phase) Lab Results  Component Value Date   LATICACIDVEN 1.5 02/08/2019       Note: Above Lab results reviewed.  Imaging  MM 3D SCREEN BREAST BILATERAL CLINICAL DATA:  Screening.  EXAM: DIGITAL SCREENING BILATERAL MAMMOGRAM WITH TOMO AND CAD  COMPARISON:  Previous exam(s).  ACR Breast Density Category b: There are scattered areas of fibroglandular density.  FINDINGS: There are no findings suspicious for malignancy. Images were processed with CAD.  IMPRESSION: No mammographic evidence of malignancy. A result letter of this screening mammogram will be mailed directly to the patient.  RECOMMENDATION: Screening mammogram in one year. (Code:[id number]-B-01Y)  BI-RADS CATEGORY  1: Negative.  Electronically Signed   By: Lillia Mountain M.D.   On: 03/08/2020 14:53  Assessment  The primary encounter diagnosis was Bilateral primary osteoarthritis of knee. Diagnoses of Chronic pain of both knees and Chronic pain syndrome were also pertinent to this visit.  Plan of Care  Kristina Reed has a current medication list which includes the following long-term medication(s): estradiol, fluoxetine, and levothyroxine.  Discontinue Mobic and avoid any  other NSAIDs while on diclofenac as below which is usually more effective for knee pain related to knee osteoarthritis.  Continue management with [facility].  Pharmacotherapy (Medications Ordered): Meds ordered this encounter  Medications  . diclofenac (VOLTAREN) 75 MG EC tablet    Sig: Take 1 tablet (75 mg total) by mouth 2 (two) times daily.    Dispense:  60 tablet    Refill:  2   Orders:  No orders of the defined types were placed in this encounter.  Follow-up plan:   Return if symptoms worsen or fail to improve.   Recent Visits No visits were found meeting these conditions. Showing recent visits within past 90 days and meeting all other requirements Today's Visits Date Type Provider Dept  11/26/20 Telemedicine Gillis Santa, MD [facility]  Showing today's visits and meeting all other requirements Future Appointments No visits were found meeting these conditions. Showing future appointments within next 90 days and meeting all other requirements  I discussed the assessment and treatment plan with the patient. The patient was provided an opportunity to ask questions and all were answered. The patient agreed with the plan and demonstrated an understanding of the instructions.  Patient advised to call back or seek an in-person evaluation if the symptoms or condition worsens.  Duration of encounter: 20 minutes.  Note by: Gillis Santa, MD Date: 11/26/2020; Time: 1:03 PM

## 2020-11-26 NOTE — Telephone Encounter (Signed)
It does not look like patient has been here since July 2020.  She will need an appointment to discuss with Dr Holley Raring.  Please call and offer her an appointment.

## 2020-11-27 NOTE — Telephone Encounter (Signed)
Patient had Virtual Call 11-26-20 / PA has been sent in for medications per [facility]. Thank you

## 2020-12-09 ENCOUNTER — Telehealth: Payer: Self-pay | Admitting: Family Medicine

## 2020-12-09 NOTE — Telephone Encounter (Signed)
I reached out to Kristina Reed today to get her scheduled for a phone visit with the Managed Medicaid team. Ms.Kosman declined our services.

## 2020-12-13 DIAGNOSIS — Z419 Encounter for procedure for purposes other than remedying health state, unspecified: Secondary | ICD-10-CM | POA: Diagnosis not present

## 2020-12-18 ENCOUNTER — Other Ambulatory Visit: Payer: Self-pay

## 2020-12-18 DIAGNOSIS — F331 Major depressive disorder, recurrent, moderate: Secondary | ICD-10-CM

## 2020-12-19 MED ORDER — FLUOXETINE HCL 20 MG PO CAPS: 20.0000 mg | ORAL_CAPSULE | Freq: Every day | ORAL | 3 refills | Status: DC

## 2020-12-24 DIAGNOSIS — M170 Bilateral primary osteoarthritis of knee: Secondary | ICD-10-CM | POA: Diagnosis not present

## 2020-12-24 DIAGNOSIS — Z7689 Persons encountering health services in other specified circumstances: Secondary | ICD-10-CM | POA: Diagnosis not present

## 2021-01-09 ENCOUNTER — Telehealth: Payer: Medicaid Other | Admitting: Family Medicine

## 2021-01-12 DIAGNOSIS — Z419 Encounter for procedure for purposes other than remedying health state, unspecified: Secondary | ICD-10-CM | POA: Diagnosis not present

## 2021-01-27 DIAGNOSIS — M1711 Unilateral primary osteoarthritis, right knee: Secondary | ICD-10-CM | POA: Diagnosis not present

## 2021-01-27 DIAGNOSIS — Z7689 Persons encountering health services in other specified circumstances: Secondary | ICD-10-CM | POA: Diagnosis not present

## 2021-01-30 ENCOUNTER — Other Ambulatory Visit: Payer: Self-pay | Admitting: Family Medicine

## 2021-01-30 DIAGNOSIS — Z1231 Encounter for screening mammogram for malignant neoplasm of breast: Secondary | ICD-10-CM

## 2021-02-12 DIAGNOSIS — Z419 Encounter for procedure for purposes other than remedying health state, unspecified: Secondary | ICD-10-CM | POA: Diagnosis not present

## 2021-02-17 ENCOUNTER — Other Ambulatory Visit: Payer: Self-pay

## 2021-02-17 DIAGNOSIS — E039 Hypothyroidism, unspecified: Secondary | ICD-10-CM

## 2021-02-17 MED ORDER — LEVOTHYROXINE SODIUM 75 MCG PO TABS: 75.0000 ug | ORAL_TABLET | Freq: Every day | ORAL | 1 refills | Status: DC

## 2021-03-05 ENCOUNTER — Telehealth: Payer: Self-pay

## 2021-03-05 NOTE — Telephone Encounter (Signed)
Pt calling; states patches for night sweats are not working.  [phone]  Pt aware PH not in office this week and will return next week.

## 2021-03-10 ENCOUNTER — Other Ambulatory Visit: Payer: Self-pay | Admitting: Obstetrics & Gynecology

## 2021-03-10 MED ORDER — ESTRADIOL 0.1 MG/24HR TD PTWK: 0.1000 mg | MEDICATED_PATCH | TRANSDERMAL | 0 refills | Status: DC

## 2021-03-10 NOTE — Telephone Encounter (Signed)
Left message to return call to advise of medication and schedule appointment

## 2021-03-10 NOTE — Telephone Encounter (Signed)
Pt aware. Tx'd to AW for scheduling.

## 2021-03-10 NOTE — Telephone Encounter (Signed)
Let her know I have changed her Rx at the pharmacy to a higher dose of this hormone patch therapy.  Also, sch annual appt in Aug We will see then if higher dose helps

## 2021-03-14 ENCOUNTER — Encounter: Payer: Medicaid Other | Admitting: Family Medicine

## 2021-03-14 DIAGNOSIS — Z419 Encounter for procedure for purposes other than remedying health state, unspecified: Secondary | ICD-10-CM | POA: Diagnosis not present

## 2021-03-20 ENCOUNTER — Encounter: Payer: Medicaid Other | Admitting: Unknown Physician Specialty

## 2021-03-26 ENCOUNTER — Other Ambulatory Visit: Payer: Self-pay

## 2021-03-26 ENCOUNTER — Encounter: Payer: Self-pay | Admitting: Unknown Physician Specialty

## 2021-03-26 ENCOUNTER — Ambulatory Visit: Payer: Medicaid Other | Admitting: Unknown Physician Specialty

## 2021-03-26 VITALS — BP 118/84 | HR 98 | Temp 97.9°F | Resp 16 | Ht 62.0 in | Wt 174.2 lb

## 2021-03-26 DIAGNOSIS — E039 Hypothyroidism, unspecified: Secondary | ICD-10-CM

## 2021-03-26 DIAGNOSIS — Z0000 Encounter for general adult medical examination without abnormal findings: Secondary | ICD-10-CM

## 2021-03-26 DIAGNOSIS — M170 Bilateral primary osteoarthritis of knee: Secondary | ICD-10-CM

## 2021-03-26 DIAGNOSIS — Z7689 Persons encountering health services in other specified circumstances: Secondary | ICD-10-CM | POA: Diagnosis not present

## 2021-03-26 DIAGNOSIS — F331 Major depressive disorder, recurrent, moderate: Secondary | ICD-10-CM

## 2021-03-26 DIAGNOSIS — Z780 Asymptomatic menopausal state: Secondary | ICD-10-CM | POA: Diagnosis not present

## 2021-03-26 NOTE — Assessment & Plan Note (Signed)
Check thyroid today

## 2021-03-26 NOTE — Assessment & Plan Note (Signed)
Doing well with fluoxetine

## 2021-03-26 NOTE — Assessment & Plan Note (Signed)
Seeing specialist with occasional use of Voltaren

## 2021-03-26 NOTE — Progress Notes (Signed)
BP 118/84   Pulse 98   Temp 97.9 F (36.6 C) (Oral)   Resp 16   Ht '5\' 2"'  (1.575 m)   Wt 174 lb 3.2 oz (79 kg)   LMP 05/09/1993 (Approximate)   SpO2 96%   BMI 31.86 kg/m    Subjective:    Patient ID: [name], female    DOB: 1966-04-26, 55 y.o.   MRN: [id number]  HPI: JANECE LAIDLAW is a 55 y.o. female  Chief Complaint  Patient presents with   Annual Exam     Depression Pt taking Fluoxetine 20 mg.   Depression screen [facility] 2/9 03/26/2021 10/21/2020 03/12/2020 01/19/2020 08/01/2019  Decreased Interest 0 3 0 0 0  Down, Depressed, Hopeless 0 2 0 0 0  PHQ - 2 Score 0 5 0 0 0  Altered sleeping 0 1 0 0 0  Tired, decreased energy 0 1 0 0 0  Change in appetite 0 0 0 0 0  Feeling bad or failure about yourself  0 0 0 0 0  Trouble concentrating 0 1 0 0 0  Moving slowly or fidgety/restless 0 3 0 0 0  Suicidal thoughts 0 0 0 0 0  PHQ-9 Score 0 11 0 0 0  Difficult doing work/chores Not difficult at all Not difficult at all Not difficult at all Not difficult at all Not difficult at all  Some recent data might be hidden   Menopause Pt is having sweating at night.  Climara helps and would like to continue.    Hypothyroid Pt with some weight gain, is sweating as above.    Social History   Socioeconomic History   Marital status: Legally Separated    Spouse name: Jeneen Rinks   Number of children: 2   Years of education: Not on file   Highest education level: Not on file  Occupational History   Occupation: un employed  Tobacco Use   Smoking status: Never   Smokeless tobacco: Never  Vaping Use   Vaping Use: Never used  Substance and Sexual Activity   Alcohol use: Yes    Comment: twice a week   Drug use: Not Currently   Sexual activity: Not Currently    Birth control/protection: Surgical  Other Topics Concern   Not on file  Social History Narrative   Not on file   Social Determinants of Health   Financial Resource Strain: Low Risk    Difficulty of Paying Living Expenses:  Not hard at all  Food Insecurity: No Food Insecurity   Worried About Charity fundraiser in the Last Year: Never true   Arboriculturist in the Last Year: Never true  Transportation Needs: Unmet Transportation Needs   Lack of Transportation (Medical): Yes   Lack of Transportation (Non-Medical): Yes  Physical Activity: Inactive   Days of Exercise per Week: 0 days   Minutes of Exercise per Session: 0 min  Stress: Stress Concern Present   Feeling of Stress : To some extent  Social Connections: Socially Isolated   Frequency of Communication with Friends and Family: Once a week   Frequency of Social Gatherings with Friends and Family: Once a week   Attends Religious Services: Never   Marine scientist or Organizations: No   Attends Archivist Meetings: Never   Marital Status: Separated  Intimate Partner Violence: Not At Risk   Fear of Current or Ex-Partner: No   Emotionally Abused: No   Physically Abused: No  Sexually Abused: No   Family History  Problem Relation Age of Onset   Diabetes Mother    Breast cancer Maternal Grandmother    Cancer Paternal Grandmother        cancer?   Cirrhosis Cousin    Breast cancer Other        x4   Past Medical History:  Diagnosis Date   Anxiety    Arthritis    right knee and right elbow   Cancer ([place])    Cervical CA with partial hysterectomy.   Cognitive impairment, mild, so stated    Depression    Diverticulosis    Eosinophilic esophagitis 87/86/7672   Biopsy Dec 2018   Esophageal dysphagia    Family history of breast cancer    Gallstones    GERD (gastroesophageal reflux disease)    History of cervical cancer    Hx of cervical cancer 01/30/2016   Hypertension    Hypothyroidism    Sleep apnea    does not use a C-PAP   Status post partial hysterectomy    Due to Cervical CA   Vaginal inclusion cyst    Past Surgical History:  Procedure Laterality Date   ABDOMINAL HYSTERECTOMY     partial hysterectomy   BREAST  BIOPSY Bilateral 01/01/2016   Fibroadenoma   COLONOSCOPY WITH PROPOFOL N/A 06/27/2015   Procedure: COLONOSCOPY WITH PROPOFOL;  Surgeon: Josefine Class, MD;  Location: [facility] ENDOSCOPY;  Service: Endoscopy;  Laterality: N/A;   COLONOSCOPY WITH PROPOFOL N/A 09/23/2017   Procedure: COLONOSCOPY WITH PROPOFOL;  Surgeon: Jonathon Bellows, MD;  Location: [facility] ENDOSCOPY;  Service: Gastroenterology;  Laterality: N/A;   ESOPHAGOGASTRODUODENOSCOPY (EGD) WITH PROPOFOL N/A 07/22/2017   Procedure: ESOPHAGOGASTRODUODENOSCOPY (EGD) WITH PROPOFOL;  Surgeon: Jonathon Bellows, MD;  Location: [facility] ENDOSCOPY;  Service: Gastroenterology;  Laterality: N/A;   ESOPHAGOGASTRODUODENOSCOPY (EGD) WITH PROPOFOL N/A 03/31/2018   Procedure: ESOPHAGOGASTRODUODENOSCOPY (EGD) WITH PROPOFOL;  Surgeon: Lin Landsman, MD;  Location[facility] ENDOSCOPY;  Service: Gastroenterology;  Laterality: N/A;   KNEE ARTHROSCOPY Right 07/22/2016   Procedure: ARTHROSCOPY KNEE debridement microfracture;  Surgeon: Leanor Kail, MD;  Location: [facility] ORS;  Service: Orthopedics;  Laterality: Right;   KNEE ARTHROSCOPY WITH MEDIAL MENISECTOMY Left 11/10/2017   Procedure: KNEE ARTHROSCOPY WITH PARTIAL MEDIAL MENISECTOMY&patella femoral debridement, & ablation;  Surgeon: Leanor Kail, MD;  Location: [facility] ORS;  Service: Orthopedics;  Laterality: Left;   TUBAL LIGATION       Relevant past medical, surgical, family and social history reviewed and updated as indicated. Interim medical history since our last visit reviewed. Allergies and medications reviewed and updated.  Review of Systems  Constitutional: Negative.   Musculoskeletal:        Takes Diclofenac on occasion due to knee pain   Per HPI unless specifically indicated above     Objective:    BP 118/84   Pulse 98   Temp 97.9 F (36.6 C) (Oral)   Resp 16   Ht '5\' 2"'  (1.575 m)   Wt 174 lb 3.2 oz (79 kg)   LMP 05/09/1993 (Approximate)   SpO2 96%   BMI 31.86 kg/m   Wt Readings from Last 3  Encounters:  03/26/21 174 lb 3.2 oz (79 kg)  10/21/20 168 lb (76.2 kg)  04/16/20 155 lb (70.3 kg)    Physical Exam Constitutional:      General: She is not in acute distress.    Appearance: Normal appearance. She is well-developed.  HENT:     Head: Normocephalic and atraumatic.  Eyes:  General: Lids are normal. No scleral icterus.       Right eye: No discharge.        Left eye: No discharge.     Conjunctiva/sclera: Conjunctivae normal.  Neck:     Vascular: No carotid bruit or JVD.  Cardiovascular:     Rate and Rhythm: Normal rate and regular rhythm.     Heart sounds: Normal heart sounds.  Pulmonary:     Effort: Pulmonary effort is normal. No respiratory distress.     Breath sounds: Normal breath sounds.  Abdominal:     General: Abdomen is flat. Bowel sounds are normal.     Palpations: Abdomen is soft. There is no hepatomegaly or splenomegaly.  Musculoskeletal:        General: Normal range of motion.     Cervical back: Normal range of motion and neck supple.  Skin:    General: Skin is warm and dry.     Coloration: Skin is not pale.     Findings: No rash.  Neurological:     Mental Status: She is alert and oriented to person, place, and time.  Psychiatric:        Behavior: Behavior normal.        Thought Content: Thought content normal.        Judgment: Judgment normal.    Results for orders placed or performed in visit on 03/26/21  CBC with Differential/Platelet  Result Value Ref Range   WBC 7.3 3.8 - 10.8 Thousand/uL   RBC 4.42 3.80 - 5.10 Million/uL   Hemoglobin 12.9 11.7 - 15.5 g/dL   HCT 38.5 35.0 - 45.0 %   MCV 87.1 80.0 - 100.0 fL   MCH 29.2 27.0 - 33.0 pg   MCHC 33.5 32.0 - 36.0 g/dL   RDW 13.3 11.0 - 15.0 %   Platelets 224 140 - 400 Thousand/uL   MPV 10.4 7.5 - 12.5 fL   Neutro Abs 3,986 1,500 - 7,800 cells/uL   Lymphs Abs 2,022 850 - 3,900 cells/uL   Absolute Monocytes 358 200 - 950 cells/uL   Eosinophils Absolute 883 (H) 15 - 500 cells/uL    Basophils Absolute 51 0 - 200 cells/uL   Neutrophils Relative % 54.6 %   Total Lymphocyte 27.7 %   Monocytes Relative 4.9 %   Eosinophils Relative 12.1 %   Basophils Relative 0.7 %  COMPLETE METABOLIC PANEL WITH GFR  Result Value Ref Range   Glucose, Bld 99 65 - 99 mg/dL   BUN 11 7 - 25 mg/dL   Creat 0.79 0.50 - 1.03 mg/dL   eGFR 90 > OR = 60 mL/min/1.59m   BUN/Creatinine Ratio NOT APPLICABLE 6 - 22 (calc)   Sodium 140 135 - 146 mmol/L   Potassium 4.2 3.5 - 5.3 mmol/L   Chloride 102 98 - 110 mmol/L   CO2 28 20 - 32 mmol/L   Calcium 9.5 8.6 - 10.4 mg/dL   Total Protein 7.0 6.1 - 8.1 g/dL   Albumin 4.3 3.6 - 5.1 g/dL   Globulin 2.7 1.9 - 3.7 g/dL (calc)   AG Ratio 1.6 1.0 - 2.5 (calc)   Total Bilirubin 0.3 0.2 - 1.2 mg/dL   Alkaline phosphatase (APISO) 82 37 - 153 U/L   AST 32 10 - 35 U/L   ALT 51 (H) 6 - 29 U/L  Hemoglobin A1c  Result Value Ref Range   Hgb A1c MFr Bld 5.7 (H) <5.7 % of total Hgb   Mean Plasma Glucose 117  mg/dL   eAG (mmol/L) 6.5 mmol/L  HIV Antibody (routine testing w rflx)  Result Value Ref Range   HIV 1&2 Ab, 4th Generation NON-REACTIVE NON-REACTIVE  TSH  Result Value Ref Range   TSH 2.82 mIU/L  Lipid panel  Result Value Ref Range   Cholesterol 220 (H) <200 mg/dL   HDL 65 > OR = 50 mg/dL   Triglycerides 231 (H) <150 mg/dL   LDL Cholesterol (Calc) 120 (H) mg/dL (calc)   Total CHOL/HDL Ratio 3.4 <5.0 (calc)   Non-HDL Cholesterol (Calc) 155 (H) <130 mg/dL (calc)      Assessment & Plan:   Problem List Items Addressed This Visit       Unprioritized   Adult hypothyroidism    Check thyroid today       Relevant Orders   TSH (Completed)   Bilateral primary osteoarthritis of knee    Seeing specialist with occasional use of Voltaren       Depression, major, recurrent, moderate ([facility])    Doing well with fluoxetine       Menopause    Risk benefit of estrogen discussed.  She would like to continue       Other Visit Diagnoses     Annual  physical exam    -  Primary   Relevant Orders   CBC with Differential/Platelet (Completed)   COMPLETE METABOLIC PANEL WITH GFR (Completed)   Hemoglobin A1c (Completed)   HIV Antibody (routine testing w rflx) (Completed)   Lipid panel (Completed)       HM  Willing to get booster: Will check with pharmacy Will call to schedule mammogram HIV update.  Refuses GC and Chlamydia Colonoscopy up to date Update Shingles     Follow up plan: Return if symptoms worsen or fail to improve.

## 2021-03-26 NOTE — Assessment & Plan Note (Signed)
Risk benefit of estrogen discussed.  She would like to continue

## 2021-03-26 NOTE — Patient Instructions (Signed)
Preventive Care 53-53 Years Old, Female Preventive care refers to lifestyle choices and visits with your health care provider that can promote health and wellness. This includes: A yearly physical exam. This is also called an annual wellness visit. Regular dental and eye exams. Immunizations. Screening for certain conditions. Healthy lifestyle choices, such as: Eating a healthy diet. Getting regular exercise. Not using drugs or products that contain nicotine and tobacco. Limiting alcohol use. What can I expect for my preventive care visit? Physical exam Your health care provider will check your: Height and weight. These may be used to calculate your BMI (body mass index). BMI is a measurement that tells if you are at a healthy weight. Heart rate and blood pressure. Body temperature. Skin for abnormal spots. Counseling Your health care provider may ask you questions about your: Past medical problems. Family's medical history. Alcohol, tobacco, and drug use. Emotional well-being. Home life and relationship well-being. Sexual activity. Diet, exercise, and sleep habits. Work and work Statistician. Access to firearms. Method of birth control. Menstrual cycle. Pregnancy history. What immunizations do I need?  Vaccines are usually given at various ages, according to a schedule. Your health care provider will recommend vaccines for you based on your age, medicalhistory, and lifestyle or other factors, such as travel or where you work. What tests do I need? Blood tests Lipid and cholesterol levels. These may be checked every 5 years, or more often if you are over 37 years old. Hepatitis C test. Hepatitis B test. Screening Lung cancer screening. You may have this screening every year starting at age 30 if you have a 30-pack-year history of smoking and currently smoke or have quit within the past 15 years. Colorectal cancer screening. All adults should have this screening starting at  age 23 and continuing until age 53. Your health care provider may recommend screening at age 53 if you are at increased risk. You will have tests every 1-10 years, depending on your results and the type of screening test. Diabetes screening. This is done by checking your blood sugar (glucose) after you have not eaten for a while (fasting). You may have this done every 1-3 years. Mammogram. This may be done every 1-2 years. Talk with your health care provider about when you should start having regular mammograms. This may depend on whether you have a family history of breast cancer. BRCA-related cancer screening. This may be done if you have a family history of breast, ovarian, tubal, or peritoneal cancers. Pelvic exam and Pap test. This may be done every 3 years starting at age 79. Starting at age 54, this may be done every 5 years if you have a Pap test in combination with an HPV test. Other tests STD (sexually transmitted disease) testing, if you are at risk. Bone density scan. This is done to screen for osteoporosis. You may have this scan if you are at high risk for osteoporosis. Talk with your health care provider about your test results, treatment options,and if necessary, the need for more tests. Follow these instructions at home: Eating and drinking  Eat a diet that includes fresh fruits and vegetables, whole grains, lean protein, and low-fat dairy products. Take vitamin and mineral supplements as recommended by your health care provider. Do not drink alcohol if: Your health care provider tells you not to drink. You are pregnant, may be pregnant, or are planning to become pregnant. If you drink alcohol: Limit how much you have to 0-1 drink a day. Be aware  of how much alcohol is in your drink. In the [place] one drink equals one 12 oz bottle of beer (355 mL), one 5 oz glass of wine (148 mL), or one 1 oz glass of hard liquor (44 mL).  Lifestyle Take daily care of your teeth and  gums. Brush your teeth every morning and night with fluoride toothpaste. Floss one time each day. Stay active. Exercise for at least 30 minutes 5 or more days each week. Do not use any products that contain nicotine or tobacco, such as cigarettes, e-cigarettes, and chewing tobacco. If you need help quitting, ask your health care provider. Do not use drugs. If you are sexually active, practice safe sex. Use a condom or other form of protection to prevent STIs (sexually transmitted infections). If you do not wish to become pregnant, use a form of birth control. If you plan to become pregnant, see your health care provider for a prepregnancy visit. If told by your health care provider, take low-dose aspirin daily starting at age 29. Find healthy ways to cope with stress, such as: Meditation, yoga, or listening to music. Journaling. Talking to a trusted person. Spending time with friends and family. Safety Always wear your seat belt while driving or riding in a vehicle. Do not drive: If you have been drinking alcohol. Do not ride with someone who has been drinking. When you are tired or distracted. While texting. Wear a helmet and other protective equipment during sports activities. If you have firearms in your house, make sure you follow all gun safety procedures. What's next? Visit your health care provider once a year for an annual wellness visit. Ask your health care provider how often you should have your eyes and teeth checked. Stay up to date on all vaccines. This information is not intended to replace advice given to you by your health care provider. Make sure you discuss any questions you have with your healthcare provider. Document Revised: 06/04/2020 Document Reviewed: 05/12/2018 Elsevier Patient Education  2022 [employer].

## 2021-03-27 LAB — HIV ANTIBODY (ROUTINE TESTING W REFLEX): HIV 1&2 Ab, 4th Generation: NONREACTIVE

## 2021-03-27 LAB — CBC WITH DIFFERENTIAL/PLATELET
Absolute Monocytes: 358 cells/uL (ref 200–950)
Basophils Absolute: 51 cells/uL (ref 0–200)
Basophils Relative: 0.7 %
Eosinophils Absolute: 883 cells/uL — ABNORMAL HIGH (ref 15–500)
Eosinophils Relative: 12.1 %
HCT: 38.5 % (ref 35.0–45.0)
Hemoglobin: 12.9 g/dL (ref 11.7–15.5)
Lymphs Abs: 2022 cells/uL (ref 850–3900)
MCH: 29.2 pg (ref 27.0–33.0)
MCHC: 33.5 g/dL (ref 32.0–36.0)
MCV: 87.1 fL (ref 80.0–100.0)
MPV: 10.4 fL (ref 7.5–12.5)
Monocytes Relative: 4.9 %
Neutro Abs: 3986 cells/uL (ref 1500–7800)
Neutrophils Relative %: 54.6 %
Platelets: 224 10*3/uL (ref 140–400)
RBC: 4.42 10*6/uL (ref 3.80–5.10)
RDW: 13.3 % (ref 11.0–15.0)
Total Lymphocyte: 27.7 %
WBC: 7.3 10*3/uL (ref 3.8–10.8)

## 2021-03-27 LAB — TSH: TSH: 2.82 mIU/L

## 2021-03-27 LAB — COMPLETE METABOLIC PANEL WITH GFR
AG Ratio: 1.6 (calc) (ref 1.0–2.5)
ALT: 51 U/L — ABNORMAL HIGH (ref 6–29)
AST: 32 U/L (ref 10–35)
Albumin: 4.3 g/dL (ref 3.6–5.1)
Alkaline phosphatase (APISO): 82 U/L (ref 37–153)
BUN: 11 mg/dL (ref 7–25)
CO2: 28 mmol/L (ref 20–32)
Calcium: 9.5 mg/dL (ref 8.6–10.4)
Chloride: 102 mmol/L (ref 98–110)
Creat: 0.79 mg/dL (ref 0.50–1.03)
Globulin: 2.7 g/dL (calc) (ref 1.9–3.7)
Glucose, Bld: 99 mg/dL (ref 65–99)
Potassium: 4.2 mmol/L (ref 3.5–5.3)
Sodium: 140 mmol/L (ref 135–146)
Total Bilirubin: 0.3 mg/dL (ref 0.2–1.2)
Total Protein: 7 g/dL (ref 6.1–8.1)
eGFR: 90 mL/min/{1.73_m2} (ref >=60)

## 2021-03-27 LAB — HEMOGLOBIN A1C
Hgb A1c MFr Bld: 5.7 % of total Hgb — ABNORMAL HIGH (ref <5.7)
Mean Plasma Glucose: 117 mg/dL
eAG (mmol/L): 6.5 mmol/L

## 2021-03-27 LAB — LIPID PANEL
Cholesterol: 220 mg/dL — ABNORMAL HIGH (ref <200)
HDL: 65 mg/dL (ref >=50)
LDL Cholesterol (Calc): 120 mg/dL (calc) — ABNORMAL HIGH
Non-HDL Cholesterol (Calc): 155 mg/dL (calc) — ABNORMAL HIGH (ref <130)
Total CHOL/HDL Ratio: 3.4 (calc) (ref <5.0)
Triglycerides: 231 mg/dL — ABNORMAL HIGH (ref <150)

## 2021-04-04 DIAGNOSIS — Z7689 Persons encountering health services in other specified circumstances: Secondary | ICD-10-CM | POA: Diagnosis not present

## 2021-04-09 ENCOUNTER — Ambulatory Visit
Admission: RE | Admit: 2021-04-09 | Discharge: 2021-04-09 | Disposition: A | Discharge status: Home / Self Care | Payer: Medicaid Other | Source: Ambulatory Visit | Attending: Family Medicine | Admitting: Family Medicine

## 2021-04-09 ENCOUNTER — Other Ambulatory Visit: Payer: Self-pay

## 2021-04-09 ENCOUNTER — Other Ambulatory Visit: Payer: Self-pay | Admitting: Obstetrics and Gynecology

## 2021-04-09 DIAGNOSIS — Z1231 Encounter for screening mammogram for malignant neoplasm of breast: Secondary | ICD-10-CM | POA: Insufficient documentation

## 2021-04-09 DIAGNOSIS — N63 Unspecified lump in unspecified breast: Secondary | ICD-10-CM

## 2021-04-09 DIAGNOSIS — Z7689 Persons encountering health services in other specified circumstances: Secondary | ICD-10-CM | POA: Diagnosis not present

## 2021-04-09 DIAGNOSIS — R928 Other abnormal and inconclusive findings on diagnostic imaging of breast: Secondary | ICD-10-CM

## 2021-04-14 DIAGNOSIS — Z419 Encounter for procedure for purposes other than remedying health state, unspecified: Secondary | ICD-10-CM | POA: Diagnosis not present

## 2021-04-16 DIAGNOSIS — Z7689 Persons encountering health services in other specified circumstances: Secondary | ICD-10-CM | POA: Diagnosis not present

## 2021-04-17 ENCOUNTER — Other Ambulatory Visit: Payer: Self-pay

## 2021-04-17 ENCOUNTER — Ambulatory Visit
Admission: RE | Admit: 2021-04-17 | Discharge: 2021-04-17 | Disposition: A | Discharge status: Home / Self Care | Payer: Medicaid Other | Source: Ambulatory Visit | Attending: Obstetrics and Gynecology | Admitting: Obstetrics and Gynecology

## 2021-04-17 DIAGNOSIS — N63 Unspecified lump in unspecified breast: Secondary | ICD-10-CM

## 2021-04-17 DIAGNOSIS — R922 Inconclusive mammogram: Secondary | ICD-10-CM | POA: Diagnosis not present

## 2021-04-17 DIAGNOSIS — Z7689 Persons encountering health services in other specified circumstances: Secondary | ICD-10-CM | POA: Diagnosis not present

## 2021-04-17 DIAGNOSIS — R928 Other abnormal and inconclusive findings on diagnostic imaging of breast: Secondary | ICD-10-CM | POA: Diagnosis not present

## 2021-04-21 ENCOUNTER — Encounter: Payer: Self-pay | Admitting: Obstetrics & Gynecology

## 2021-04-21 ENCOUNTER — Other Ambulatory Visit: Payer: Self-pay

## 2021-04-21 ENCOUNTER — Ambulatory Visit (INDEPENDENT_AMBULATORY_CARE_PROVIDER_SITE_OTHER): Payer: Medicaid Other | Admitting: Obstetrics & Gynecology

## 2021-04-21 VITALS — BP 120/80 | Ht 61.5 in | Wt 178.0 lb

## 2021-04-21 DIAGNOSIS — L900 Lichen sclerosus et atrophicus: Secondary | ICD-10-CM

## 2021-04-21 DIAGNOSIS — Z7689 Persons encountering health services in other specified circumstances: Secondary | ICD-10-CM | POA: Diagnosis not present

## 2021-04-21 DIAGNOSIS — Z1211 Encounter for screening for malignant neoplasm of colon: Secondary | ICD-10-CM | POA: Diagnosis not present

## 2021-04-21 DIAGNOSIS — R232 Flushing: Secondary | ICD-10-CM | POA: Diagnosis not present

## 2021-04-21 DIAGNOSIS — Z01419 Encounter for gynecological examination (general) (routine) without abnormal findings: Secondary | ICD-10-CM | POA: Diagnosis not present

## 2021-04-21 MED ORDER — CATAPRES-TTS-2 0.2 MG/24HR TD PTWK: 0.2000 mg | MEDICATED_PATCH | TRANSDERMAL | 3 refills | Status: DC

## 2021-04-21 MED ORDER — ESTRADIOL 1 MG PO TABS: 1.0000 mg | ORAL_TABLET | Freq: Every day | ORAL | 3 refills | Status: DC

## 2021-04-21 NOTE — Patient Instructions (Signed)
PAP every three years Mammogram every year Colonoscopy (SOON) every 10 years Labs yearly (with PCP)  Thank you for choosing [facility]. As part of our ongoing efforts to improve patient experience, we would appreciate your feedback. Please fill out the short survey that you will receive by mail or MyChart. Your opinion is important to [name]! - Dr. Kenton Kingfisher

## 2021-04-21 NOTE — Progress Notes (Signed)
HPI:      [name] is a 55 y.o. [id number] who LMP was in the past, she presents today for her annual examination. Prior hysterectomy. The patient has no complaints today.  CONTINUES w HOT FLASHES.  ERT PATCHES DO NOT STICK WELL SO RARELY USES THEM. The patient is sexually active. Herlast pap: approximate date 2021 and was normal and last mammogram: approximate date 2022 and was normal.  The patient does perform self breast exams.  There is no notable family history of breast or ovarian cancer in her family. The patient is taking hormone replacement therapy. Patient denies post-menopausal vaginal bleeding.   The patient has regular exercise: yes. The patient denies current symptoms of depression.    GYN Hx: Last Colonoscopy: in the past\\ . Normal.     PMHx: Past Medical History:  Diagnosis Date   Anxiety    Arthritis    right knee and right elbow   Cancer ([place])    Cervical CA with partial hysterectomy.   Cognitive impairment, mild, so stated    Depression    Diverticulosis    Eosinophilic esophagitis [id number]   Biopsy Dec 2018   Esophageal dysphagia    Family history of breast cancer    Gallstones    GERD (gastroesophageal reflux disease)    History of cervical cancer    Hx of cervical cancer 01/30/2016   Hypertension    Hypothyroidism    Sleep apnea    does not use a C-PAP   Status post partial hysterectomy    Due to Cervical CA   Vaginal inclusion cyst    Past Surgical History:  Procedure Laterality Date   ABDOMINAL HYSTERECTOMY     partial hysterectomy   BREAST BIOPSY Bilateral 01/01/2016   Fibroadenoma   COLONOSCOPY WITH PROPOFOL N/A 06/27/2015   Procedure: COLONOSCOPY WITH PROPOFOL;  Surgeon: Josefine Class, MD;  Location: [facility] ENDOSCOPY;  Service: Endoscopy;  Laterality: N/A;   COLONOSCOPY WITH PROPOFOL N/A 09/23/2017   Procedure: COLONOSCOPY WITH PROPOFOL;  Surgeon: Jonathon Bellows, MD;  Location: [facility] ENDOSCOPY;  Service: Gastroenterology;  Laterality: N/A;    ESOPHAGOGASTRODUODENOSCOPY (EGD) WITH PROPOFOL N/A 07/22/2017   Procedure: ESOPHAGOGASTRODUODENOSCOPY (EGD) WITH PROPOFOL;  Surgeon: Jonathon Bellows, MD;  Location: [facility] ENDOSCOPY;  Service: Gastroenterology;  Laterality: N/A;   ESOPHAGOGASTRODUODENOSCOPY (EGD) WITH PROPOFOL N/A 03/31/2018   Procedure: ESOPHAGOGASTRODUODENOSCOPY (EGD) WITH PROPOFOL;  Surgeon: Lin Landsman, MD;  Location: [facility] ENDOSCOPY;  Service: Gastroenterology;  Laterality: N/A;   KNEE ARTHROSCOPY Right 07/22/2016   Procedure: ARTHROSCOPY KNEE debridement microfracture;  Surgeon: Leanor Kail, MD;  Location: [facility] ORS;  Service: Orthopedics;  Laterality: Right;   KNEE ARTHROSCOPY WITH MEDIAL MENISECTOMY Left 11/10/2017   Procedure: KNEE ARTHROSCOPY WITH PARTIAL MEDIAL MENISECTOMY&patella femoral debridement, & ablation;  Surgeon: Leanor Kail, MD;  Location: [facility] ORS;  Service: Orthopedics;  Laterality: Left;   TUBAL LIGATION     Family History  Problem Relation Age of Onset   Diabetes Mother    Breast cancer Maternal Grandmother    Cancer Paternal Grandmother        cancer?   Cirrhosis Cousin    Breast cancer Cousin    Breast cancer Other        x4   Social History   Tobacco Use   Smoking status: Never   Smokeless tobacco: Never  Vaping Use   Vaping Use: Never used  Substance Use Topics   Alcohol use: Yes    Comment: twice a week   Drug  use: Not Currently    Current Outpatient Medications:    diclofenac (VOLTAREN) 75 MG EC tablet, Take 1 tablet (75 mg total) by mouth 2 (two) times daily., Disp: 60 tablet, Rfl: 2   estradiol (ESTRACE) 1 MG tablet, Take 1 tablet (1 mg total) by mouth daily., Disp: 90 tablet, Rfl: 3   FLUoxetine (PROZAC) 20 MG capsule, Take 1 capsule (20 mg total) by mouth daily., Disp: 90 capsule, Rfl: 3   levothyroxine (SYNTHROID) 75 MCG tablet, Take 1 tablet (75 mcg total) by mouth daily before breakfast., Disp: 90 tablet, Rfl: 1   methylPREDNISolone (MEDROL DOSEPAK) 4 MG TBPK  tablet, , Disp: , Rfl:    CATAPRES-TTS-2 0.2 MG/24HR patch, Place 1 patch (0.2 mg total) onto the skin once a week., Disp: 12 patch, Rfl: 3 Allergies: Percocet [oxycodone-acetaminophen]  Review of Systems  Constitutional:  Negative for chills, fever and malaise/fatigue.  HENT:  Negative for congestion, sinus pain and sore throat.   Eyes:  Negative for blurred vision and pain.  Respiratory:  Negative for cough and wheezing.   Cardiovascular:  Negative for chest pain and leg swelling.  Gastrointestinal:  Negative for abdominal pain, constipation, diarrhea, heartburn, nausea and vomiting.  Genitourinary:  Negative for dysuria, frequency, hematuria and urgency.  Musculoskeletal:  Negative for back pain, joint pain, myalgias and neck pain.  Skin:  Negative for itching and rash.  Neurological:  Negative for dizziness, tremors and weakness.  Endo/Heme/Allergies:  Does not bruise/bleed easily.  Psychiatric/Behavioral:  Negative for depression. The patient is not nervous/anxious and does not have insomnia.    Objective: BP 120/80   Ht 5' 1.5" (1.562 m)   Wt 178 lb (80.7 kg)   LMP 05/09/1993 (Approximate)   BMI 33.09 kg/m   Filed Weights   04/21/21 0804  Weight: 178 lb (80.7 kg)   Body mass index is 33.09 kg/m. Physical Exam Constitutional:      General: She is not in acute distress.    Appearance: She is well-developed.  Genitourinary:     Vulva, bladder, rectum and urethral meatus normal.     No lesions in the vagina.     Genitourinary Comments: Vaginal cuff well healed     Right Labia: No rash, tenderness or lesions.    Left Labia: No tenderness, lesions or rash.    No vaginal bleeding.      Right Adnexa: not tender and no mass present.    Left Adnexa: not tender and no mass present.    Cervix is absent.     Uterus is absent.     Pelvic exam was performed with patient in the lithotomy position.  Breasts:    Right: No mass, skin change or tenderness.     Left: No mass, skin  change or tenderness.  HENT:     Head: Normocephalic and atraumatic. No laceration.     Right Ear: Hearing normal.     Left Ear: Hearing normal.     Mouth/Throat:     Pharynx: Uvula midline.  Eyes:     Pupils: Pupils are equal, round, and reactive to light.  Neck:     Thyroid: No thyromegaly.  Cardiovascular:     Rate and Rhythm: Normal rate and regular rhythm.     Heart sounds: No murmur heard.   No friction rub. No gallop.  Pulmonary:     Effort: Pulmonary effort is normal. No respiratory distress.     Breath sounds: Normal breath sounds. No wheezing.  Abdominal:  General: Bowel sounds are normal. There is no distension.     Palpations: Abdomen is soft.     Tenderness: There is no abdominal tenderness. There is no rebound.  Musculoskeletal:        General: Normal range of motion.     Cervical back: Normal range of motion and neck supple.  Neurological:     Mental Status: She is alert and oriented to person, place, and time.     Cranial Nerves: No cranial nerve deficit.  Skin:    General: Skin is warm and dry.  Psychiatric:        Judgment: Judgment normal.  Vitals reviewed.    Assessment: Annual Exam 1. Women's annual routine gynecological examination   2. Hot flashes   3. Lichen sclerosus et atrophicus   4. Screen for colon cancer     Plan:            1.  Cervical Screening-  Pap smear schedule reviewed with patient, every 5 years  2. Breast screening- Exam annually and mammogram scheduled  3. Colonoscopy every 10 years, Hemoccult testing after age 45  4. Labs managed by PCP  5. Counseling for hormonal therapy: wants to change HRT or dose due to hot flashes Will change to ERT pill as patches do not stick well Cont Clonidine Taper off of ERT in future once hot flashes resolve HRT I have discussed again w pt as to HRT.  The risk/benefits of it were reviewed.  She understands that during menopause Estrogen decreases dramatically and that this results in an  increased risk of cardiovascular disease as well as osteoporosis.  We have also discussed the fact that hot flashes often result from a decrease in Estrogen, and that by replacing Estrogen, they can often be alleviated.  We have discussed skin, vaginal and urinary tract changes that may also take place from this drop in Estrogen.  Emotional changes have also been linked to Estrogen and we have briefly discussed this.  The benefits of HRT including decrease in hot flashes, vaginal dryness, and osteoporosis were discussed.  The emotional benefit and a possible change in her cardiovascular risk profile was also reviewed.  The risks associated with Hormone Replacement Therapy were also reviewed.  The use of unopposed Estrogen and its relationship to endometrial cancer was discussed.  The addition of Progesterone and its beneficial effect on endometrial cancer was also noted.  The fact that there has been no consistent definitive studies showing an increase in breast cancer in women who use HRT was discussed with the patient.  The possible side effects including breast tenderness, fluid retention, mood changes and vaginal bleeding were discussed.  The patient was informed that this is an elective medication and that she may choose not to take Hormone Replacement Therapy.  Literature on HRT was given, and I believe that after answering all of the patient's questions, she has an adequate and informed understanding of HRT.  Special emphasis on the WHI study, as well as several studies since that pertaining to the risks and benefits of estrogen replacement therapy were compared.  The possible limitations of these studies were discussed including the age stratification of the WHI study.  The possible role of Progesterone in these studies was discussed in detail.  I believe that the patient has an informed knowledge of the risks and benefits of HRT.  I have specifically discussed WHI findings and current updates.  Different  type of hormone formulation and methods of taking  hormone replacement therapy discussed.     F/U  Return in about 1 year (around 04/21/2022) for Annual.  Barnett Applebaum, MD, [facility], [facility] 04/21/2021  8:27 AM

## 2021-05-01 DIAGNOSIS — Z7689 Persons encountering health services in other specified circumstances: Secondary | ICD-10-CM | POA: Diagnosis not present

## 2021-05-12 ENCOUNTER — Telehealth (INDEPENDENT_AMBULATORY_CARE_PROVIDER_SITE_OTHER): Payer: Self-pay | Admitting: Gastroenterology

## 2021-05-12 DIAGNOSIS — Z7689 Persons encountering health services in other specified circumstances: Secondary | ICD-10-CM | POA: Diagnosis not present

## 2021-05-12 DIAGNOSIS — Z86010 Personal history of colonic polyps: Secondary | ICD-10-CM

## 2021-05-12 MED ORDER — CLENPIQ 10-3.5-12 MG-GM -GM/160ML PO SOLN: 1.0000 | ORAL | 0 refills | Status: DC

## 2021-05-12 NOTE — Progress Notes (Signed)
Gastroenterology Pre-Procedure Review  Request Date: 06/03/2021 Requesting Physician: Dr. ANNA  PATIENT REVIEW QUESTIONS: The patient responded to the following health history questions as indicated:    1. Are you having any GI issues? no 2. Do you have a personal history of Polyps? YE SREMOVED LAST COLONOSCOPY 3. Do you have a family history of Colon Cancer or Polyps? yes (AUNT AND COUSIN) 4. Diabetes Mellitus? no 5. Joint replacements in the past 12 months?no 6. Major health problems in the past 3 months?no 7. Any artificial heart valves, MVP, or defibrillator?no    MEDICATIONS & ALLERGIES:    Patient reports the following regarding taking any anticoagulation/antiplatelet therapy:   Plavix, Coumadin, Eliquis, Xarelto, Lovenox, Pradaxa, Brilinta, or Effient? no Aspirin? no  Patient confirms/reports the following medications:  Current Outpatient Medications  Medication Sig Dispense Refill   Sod Picosulfate-Mag Ox-Cit Acd (CLENPIQ) 10-3.5-12 MG-GM -GM/160ML SOLN Take 1 kit by mouth as directed. At 5 PM evening before procedure, drink 1 bottle of Clenpiq, hydrate, drink (5) 8 oz of water. Then do the same thing 5 hours prior to your procedure. 320 mL 0   CATAPRES-TTS-2 0.2 MG/24HR patch Place 1 patch (0.2 mg total) onto the skin once a week. 12 patch 3   diclofenac (VOLTAREN) 75 MG EC tablet Take 1 tablet (75 mg total) by mouth 2 (two) times daily. 60 tablet 2   estradiol (ESTRACE) 1 MG tablet Take 1 tablet (1 mg total) by mouth daily. 90 tablet 3   FLUoxetine (PROZAC) 20 MG capsule Take 1 capsule (20 mg total) by mouth daily. 90 capsule 3   levothyroxine (SYNTHROID) 75 MCG tablet Take 1 tablet (75 mcg total) by mouth daily before breakfast. 90 tablet 1   methylPREDNISolone (MEDROL DOSEPAK) 4 MG TBPK tablet      No current facility-administered medications for this visit.    Patient confirms/reports the following allergies:  Allergies  Allergen Reactions   Percocet  [Oxycodone-Acetaminophen] Palpitations    No orders of the defined types were placed in this encounter.   AUTHORIZATION INFORMATION Primary Insurance: 1D#: Group #:  Secondary Insurance: 1D#: Group #:  SCHEDULE INFORMATION: Date: 06/03/2021 Time: Location: [facility] 

## 2021-05-13 ENCOUNTER — Telehealth: Payer: Self-pay | Admitting: Gastroenterology

## 2021-05-13 ENCOUNTER — Other Ambulatory Visit: Payer: Self-pay

## 2021-05-13 NOTE — Telephone Encounter (Signed)
This patient is a [facility] patient not anna procedure needs to be with dr Marius Ditch not Vicente Males

## 2021-05-13 NOTE — Progress Notes (Signed)
CALLED GOT RESCHEDULED TO [facility] FOR 06/09/2021

## 2021-05-13 NOTE — Telephone Encounter (Signed)
I did not call patient. Did you call patient?

## 2021-05-13 NOTE — Telephone Encounter (Signed)
Pt. Returning call.

## 2021-05-15 DIAGNOSIS — Z419 Encounter for procedure for purposes other than remedying health state, unspecified: Secondary | ICD-10-CM | POA: Diagnosis not present

## 2021-05-15 DIAGNOSIS — Z803 Family history of malignant neoplasm of breast: Secondary | ICD-10-CM

## 2021-05-15 HISTORY — DX: Family history of malignant neoplasm of breast: Z80.3

## 2021-05-16 DIAGNOSIS — Z7689 Persons encountering health services in other specified circumstances: Secondary | ICD-10-CM | POA: Diagnosis not present

## 2021-05-27 ENCOUNTER — Encounter: Payer: Self-pay | Admitting: Obstetrics and Gynecology

## 2021-06-02 ENCOUNTER — Ambulatory Visit: Payer: Self-pay | Admitting: *Deleted

## 2021-06-02 NOTE — Telephone Encounter (Signed)
Patient reports she is having  pain in the left foot- on the side of her foot. Patient states the pain is severe- but she is able to walk on it. Patient states wearing shoe is painful. Patient states no discoloration and only slight swelling if any. Patient offered appointment today- but she has to arrange her transportation and states Thursday is as soon as she can come. Advised UC if she gets worse.

## 2021-06-02 NOTE — Telephone Encounter (Signed)
Patient called in to say that she have been having sharp pains in the side of her left foot for about 4 days but cant come in to be seen today but concerned. Please advise Ph#  [phone]  Reason for Disposition  [1] SEVERE pain (e.g., excruciating, unable to do any normal activities) AND [2] not improved after 2 hours of pain medicine  Answer Assessment - Initial Assessment Questions 1. ONSET: "When did the pain start?"      3-4 days 2. LOCATION: "Where is the pain located?"      Left foot- side of foot 3. PAIN: "How bad is the pain?"    (Scale 1-10; or mild, moderate, severe)  - MILD (1-3): doesn't interfere with normal activities.   - MODERATE (4-7): interferes with normal activities (e.g., work or school) or awakens from sleep, limping.   - SEVERE (8-10): excruciating pain, unable to do any normal activities, unable to walk.      Severe- able to walk- painful to wear a shoe- swollen slightly 4. WORK OR EXERCISE: "Has there been any recent work or exercise that involved this part of the body?"      no 5. CAUSE: "What do you think is causing the foot pain?"     no 6. OTHER SYMPTOMS: "Do you have any other symptoms?" (e.g., leg pain, rash, fever, numbness)     no 7. PREGNANCY: "Is there any chance you are pregnant?" "When was your last menstrual period?"     N/a  Protocols used: Foot Pain-A-AH

## 2021-06-03 ENCOUNTER — Other Ambulatory Visit: Payer: Self-pay | Admitting: Obstetrics & Gynecology

## 2021-06-04 DIAGNOSIS — Z7689 Persons encountering health services in other specified circumstances: Secondary | ICD-10-CM | POA: Diagnosis not present

## 2021-06-05 ENCOUNTER — Ambulatory Visit: Payer: Medicaid Other | Admitting: Unknown Physician Specialty

## 2021-06-06 ENCOUNTER — Encounter: Payer: Self-pay | Admitting: Gastroenterology

## 2021-06-09 ENCOUNTER — Ambulatory Visit
Admission: RE | Admit: 2021-06-09 | Discharge: 2021-06-09 | Disposition: A | Discharge status: Home / Self Care | Payer: Medicaid Other | Attending: Gastroenterology | Admitting: Gastroenterology

## 2021-06-09 ENCOUNTER — Encounter
Admission: RE | Disposition: A | Discharge status: Home / Self Care | Payer: Self-pay | Source: Home / Self Care | Attending: Gastroenterology

## 2021-06-09 ENCOUNTER — Ambulatory Visit: Payer: Medicaid Other | Admitting: Certified Registered Nurse Anesthetist

## 2021-06-09 ENCOUNTER — Encounter: Payer: Self-pay | Admitting: Gastroenterology

## 2021-06-09 DIAGNOSIS — Z860100 Personal history of colon polyps, unspecified: Secondary | ICD-10-CM

## 2021-06-09 DIAGNOSIS — Z79899 Other long term (current) drug therapy: Secondary | ICD-10-CM | POA: Insufficient documentation

## 2021-06-09 DIAGNOSIS — Z888 Allergy status to other drugs, medicaments and biological substances status: Secondary | ICD-10-CM | POA: Diagnosis not present

## 2021-06-09 DIAGNOSIS — E039 Hypothyroidism, unspecified: Secondary | ICD-10-CM | POA: Diagnosis not present

## 2021-06-09 DIAGNOSIS — Z885 Allergy status to narcotic agent status: Secondary | ICD-10-CM | POA: Diagnosis not present

## 2021-06-09 DIAGNOSIS — K6289 Other specified diseases of anus and rectum: Secondary | ICD-10-CM | POA: Diagnosis not present

## 2021-06-09 DIAGNOSIS — Z8719 Personal history of other diseases of the digestive system: Secondary | ICD-10-CM | POA: Insufficient documentation

## 2021-06-09 DIAGNOSIS — Z79890 Hormone replacement therapy (postmenopausal): Secondary | ICD-10-CM | POA: Diagnosis not present

## 2021-06-09 DIAGNOSIS — Z1211 Encounter for screening for malignant neoplasm of colon: Secondary | ICD-10-CM | POA: Insufficient documentation

## 2021-06-09 DIAGNOSIS — K626 Ulcer of anus and rectum: Secondary | ICD-10-CM | POA: Diagnosis not present

## 2021-06-09 DIAGNOSIS — L538 Other specified erythematous conditions: Secondary | ICD-10-CM | POA: Diagnosis not present

## 2021-06-09 HISTORY — PX: COLONOSCOPY WITH PROPOFOL: SHX5780

## 2021-06-09 SURGERY — COLONOSCOPY WITH PROPOFOL
Anesthesia: General

## 2021-06-09 MED ORDER — PROPOFOL 10 MG/ML IV BOLUS
INTRAVENOUS | Status: DC | PRN
  Administered 2021-06-09: 80 mg via INTRAVENOUS

## 2021-06-09 MED ORDER — LIDOCAINE HCL (CARDIAC) PF 100 MG/5ML IV SOSY
PREFILLED_SYRINGE | INTRAVENOUS | Status: DC | PRN
  Administered 2021-06-09: 50 mg via INTRAVENOUS

## 2021-06-09 MED ORDER — SODIUM CHLORIDE 0.9 % IV SOLN: INTRAVENOUS | Status: DC

## 2021-06-09 MED ORDER — LIDOCAINE HCL (PF) 2 % IJ SOLN
INTRAMUSCULAR | Status: AC
  Filled 2021-06-09: qty 5

## 2021-06-09 MED ORDER — DEXMEDETOMIDINE (PRECEDEX) IN NS 20 MCG/5ML (4 MCG/ML) IV SYRINGE
PREFILLED_SYRINGE | INTRAVENOUS | Status: DC | PRN
  Administered 2021-06-09: 12 ug via INTRAVENOUS

## 2021-06-09 MED ORDER — PROPOFOL 500 MG/50ML IV EMUL
INTRAVENOUS | Status: DC | PRN
  Administered 2021-06-09: 175 ug/kg/min via INTRAVENOUS

## 2021-06-09 MED ORDER — PROPOFOL 500 MG/50ML IV EMUL
INTRAVENOUS | Status: AC
  Filled 2021-06-09: qty 50

## 2021-06-09 NOTE — Transfer of Care (Signed)
Immediate Anesthesia Transfer of Care Note  Patient: Kristina Reed  Procedure(s) Performed: COLONOSCOPY WITH PROPOFOL  Patient Location: PACU  Anesthesia Type:General  Level of Consciousness: drowsy  Airway & Oxygen Therapy: Patient Spontanous Breathing  Post-op Assessment: Report given to RN and Post -op Vital signs reviewed and stable  Post vital signs: Reviewed and stable  Last Vitals:  Vitals Value Taken Time  BP 111/82 06/09/21 1034  Temp    Pulse 56 06/09/21 1034  Resp 12 06/09/21 1034  SpO2 98 % 06/09/21 1034    Last Pain:  Vitals:   06/09/21 0927  TempSrc: Temporal  PainSc: 0-No pain         Complications: No notable events documented.

## 2021-06-09 NOTE — Op Note (Signed)
[facility] Patient Name: Kristina Reed Procedure Date: 06/09/2021 10:09 AM MRN: [id number] Account #: [id number] Date of Birth: Jun 12, 1966 Admit Type: Outpatient Age: 55 Room: [facility] ENDO ROOM 1 Gender: Female Note Status: Finalized Instrument Name: Colonoscope [id number] Procedure:             Colonoscopy Indications:           High risk colon cancer surveillance: Personal history                         of colonic polyps, Last colonoscopy: January 2019 Providers:             Lin Landsman MD, MD Referring MD:          Delsa Grana (Referring MD) Medicines:             General Anesthesia Complications:         No immediate complications. Estimated blood loss: None. Procedure:             Pre-Anesthesia Assessment:                        - Prior to the procedure, a History and Physical was                         performed, and patient medications and allergies were                         reviewed. The patient is competent. The risks and                         benefits of the procedure and the sedation options and                         risks were discussed with the patient. All questions                         were answered and informed consent was obtained.                         Patient identification and proposed procedure were                         verified by the physician, the nurse, the                         anesthesiologist, the anesthetist and the technician                         in the pre-procedure area in the procedure room in the                         endoscopy suite. Mental Status Examination: alert and                         oriented. Airway Examination: normal oropharyngeal                         airway and neck mobility. Respiratory Examination:  clear to auscultation. CV Examination: normal.                         Prophylactic Antibiotics: The patient does not require                          prophylactic antibiotics. Prior Anticoagulants: The                         patient has taken no previous anticoagulant or                         antiplatelet agents. ASA Grade Assessment: III - A                         patient with severe systemic disease. After reviewing                         the risks and benefits, the patient was deemed in                         satisfactory condition to undergo the procedure. The                         anesthesia plan was to use general anesthesia.                         Immediately prior to administration of medications,                         the patient was re-assessed for adequacy to receive                         sedatives. The heart rate, respiratory rate, oxygen                         saturations, blood pressure, adequacy of pulmonary                         ventilation, and response to care were monitored                         throughout the procedure. The physical status of the                         patient was re-assessed after the procedure.                        After obtaining informed consent, the colonoscope was                         passed under direct vision. Throughout the procedure,                         the patient's blood pressure, pulse, and oxygen                         saturations were monitored continuously. The  Colonoscope was introduced through the anus and                         advanced to the the terminal ileum, with                         identification of the appendiceal orifice and IC                         valve. The colonoscopy was performed without                         difficulty. The patient tolerated the procedure well.                         The quality of the bowel preparation was evaluated                         using the BBPS [facility] Bowel Preparation Scale) with                         scores of: Right Colon = 3, Transverse Colon = 3 and                          Left Colon = 3 (entire mucosa seen well with no                         residual staining, small fragments of stool or opaque                         liquid). The total BBPS score equals 9. Findings:      The perianal and digital rectal examinations were normal. Pertinent       negatives include normal sphincter tone and no palpable rectal lesions.      A diffuse area of mildly erythematous mucosa was found in the rectum.       Biopsies were taken with a cold forceps for histology.      Normal mucosa was found in the entire colon. Impression:            - Erythematous mucosa in the rectum. Biopsied.                        - Normal mucosa in the entire examined colon. Recommendation:        - Discharge patient to home (with escort).                        - Resume previous diet today.                        - Continue present medications.                        - Await pathology results.                        - Repeat colonoscopy in 10 years for screening  purposes.                        - Return to my office as previously scheduled. Procedure Code(s):     --- Professional ---                        [phone], Colonoscopy, flexible; with biopsy, single or                         multiple Diagnosis Code(s):     --- Professional ---                        Z86.010, Personal history of colonic polyps                        K62.89, Other specified diseases of anus and rectum CPT copyright 2019 American Medical Association. All rights reserved. The codes documented in this report are preliminary and upon coder review may  be revised to meet current compliance requirements. Dr. Ulyess Mort Lin Landsman MD, MD 06/09/2021 10:27:53 AM This report has been signed electronically. Number of Addenda: 0 Note Initiated On: 06/09/2021 10:09 AM Scope Withdrawal Time: 0 hours 6 minutes 31 seconds  Total Procedure Duration: 0 hours 9 minutes 24 seconds  Estimated Blood Loss:   Estimated blood loss: none.      [facility]

## 2021-06-09 NOTE — Anesthesia Procedure Notes (Signed)
Date/Time: 06/09/2021 10:08 AM Performed by: Johnna Acosta, CRNA Pre-anesthesia Checklist: Patient identified, Emergency Drugs available, Suction available, Patient being monitored and Timeout performed Patient Re-evaluated:Patient Re-evaluated prior to induction Oxygen Delivery Method: Nasal cannula Preoxygenation: Pre-oxygenation with 100% oxygen Induction Type: IV induction

## 2021-06-09 NOTE — Anesthesia Postprocedure Evaluation (Signed)
Anesthesia Post Note  Patient: Kristina Reed  Procedure(s) Performed: COLONOSCOPY WITH PROPOFOL  Patient location during evaluation: PACU Anesthesia Type: General Level of consciousness: awake and alert Pain management: pain level controlled Vital Signs Assessment: post-procedure vital signs reviewed and stable Respiratory status: spontaneous breathing, nonlabored ventilation, respiratory function stable and patient connected to nasal cannula oxygen Cardiovascular status: blood pressure returned to baseline and stable Postop Assessment: no apparent nausea or vomiting Anesthetic complications: no   No notable events documented.   Last Vitals:  Vitals:   06/09/21 1034 06/09/21 1052  BP: 111/82 113/75  Pulse: (!) 56   Resp: 12   Temp:    SpO2: 98%     Last Pain:  Vitals:   06/09/21 1102  TempSrc:   PainSc: 0-No pain                 Molli Barrows

## 2021-06-09 NOTE — Anesthesia Preprocedure Evaluation (Signed)
Anesthesia Evaluation  Patient identified by MRN, date of birth, ID band Patient awake    Reviewed: Allergy & Precautions, H&P , NPO status , Patient's Chart, lab work & pertinent test results, reviewed documented beta blocker date and time   Airway Mallampati: II   Neck ROM: full    Dental  (+) Teeth Intact   Pulmonary sleep apnea and Continuous Positive Airway Pressure Ventilation ,    Pulmonary exam normal        Cardiovascular Exercise Tolerance: Poor hypertension, On Medications negative cardio ROS Normal cardiovascular exam Rhythm:regular Rate:Normal     Neuro/Psych  Headaches, PSYCHIATRIC DISORDERS Anxiety Depression  Neuromuscular disease    GI/Hepatic Neg liver ROS, GERD  Medicated,  Endo/Other  Hypothyroidism   Renal/GU Renal disease  negative genitourinary   Musculoskeletal   Abdominal   Peds  Hematology negative hematology ROS (+)   Anesthesia Other Findings Past Medical History: No date: Anxiety No date: Arthritis     Comment:  right knee and right elbow No date: Cancer [facility])     Comment:  Cervical CA with partial hysterectomy. No date: Cognitive impairment, mild, so stated No date: Depression No date: Diverticulosis 16/60/6301: Eosinophilic esophagitis     Comment:  Biopsy Dec 2018 No date: Esophageal dysphagia 05/2021: Family history of breast cancer     Comment:  cancer genetic testing letter sent No date: Gallstones No date: GERD (gastroesophageal reflux disease) No date: History of cervical cancer 01/30/2016: Hx of cervical cancer No date: Hypertension No date: Hypothyroidism No date: Sleep apnea     Comment:  does not use a C-PAP No date: Status post partial hysterectomy     Comment:  Due to Cervical CA No date: Vaginal inclusion cyst Past Surgical History: No date: ABDOMINAL HYSTERECTOMY     Comment:  partial hysterectomy 01/01/2016: BREAST BIOPSY; Bilateral     Comment:   Fibroadenoma 06/27/2015: COLONOSCOPY WITH PROPOFOL; N/A     Comment:  Procedure: COLONOSCOPY WITH PROPOFOL;  Surgeon: Josefine Class, MD;  Location: [facility] ENDOSCOPY;  Service:               Endoscopy;  Laterality: N/A; 09/23/2017: COLONOSCOPY WITH PROPOFOL; N/A     Comment:  Procedure: COLONOSCOPY WITH PROPOFOL;  Surgeon: Jonathon Bellows, MD;  Location: [facility] ENDOSCOPY;  Service:               Gastroenterology;  Laterality: N/A; 07/22/2017: ESOPHAGOGASTRODUODENOSCOPY (EGD) WITH PROPOFOL; N/A     Comment:  Procedure: ESOPHAGOGASTRODUODENOSCOPY (EGD) WITH               PROPOFOL;  Surgeon: Jonathon Bellows, MD;  Location: [facility]               ENDOSCOPY;  Service: Gastroenterology;  Laterality: N/A; 03/31/2018: ESOPHAGOGASTRODUODENOSCOPY (EGD) WITH PROPOFOL; N/A     Comment:  Procedure: ESOPHAGOGASTRODUODENOSCOPY (EGD) WITH               PROPOFOL;  Surgeon: Lin Landsman, MD;  Location:               [facility] ENDOSCOPY;  Service: Gastroenterology;  Laterality:               N/A; 07/22/2016: KNEE ARTHROSCOPY; Right     Comment:  Procedure: ARTHROSCOPY KNEE debridement microfracture;  Surgeon: Leanor Kail, MD;  Location: [facility] ORS;                Service: Orthopedics;  Laterality: Right; 11/10/2017: KNEE ARTHROSCOPY WITH MEDIAL MENISECTOMY; Left     Comment:  Procedure: KNEE ARTHROSCOPY WITH PARTIAL MEDIAL               MENISECTOMY&patella femoral debridement, & ablation;                Surgeon: Leanor Kail, MD;  Location: [facility] ORS;                Service: Orthopedics;  Laterality: Left; No date: TUBAL LIGATION BMI    Body Mass Index: 30.42 kg/m     Reproductive/Obstetrics negative OB ROS                             Anesthesia Physical Anesthesia Plan  ASA: 3  Anesthesia Plan: General   Post-op Pain Management:    Induction:   PONV Risk Score and Plan:   Airway Management Planned:   Additional Equipment:    Intra-op Plan:   Post-operative Plan:   Informed Consent: I have reviewed the patients History and Physical, chart, labs and discussed the procedure including the risks, benefits and alternatives for the proposed anesthesia with the patient or authorized representative who has indicated his/her understanding and acceptance.     Dental Advisory Given  Plan Discussed with: CRNA  Anesthesia Plan Comments:         Anesthesia Quick Evaluation

## 2021-06-09 NOTE — H&P (Signed)
Cephas Darby, MD [address]  [place]  [place], [place] [address]  Main: [phone]  Fax: [phone] Pager: [phone]  Primary Care Physician:  Delsa Grana, PA-C Primary Gastroenterologist:  Dr. Cephas Darby  Pre-Procedure History & Physical: HPI:  Kristina Reed is a 55 y.o. female is here for an colonoscopy.   Past Medical History:  Diagnosis Date   Anxiety    Arthritis    right knee and right elbow   Cancer ([place])    Cervical CA with partial hysterectomy.   Cognitive impairment, mild, so stated    Depression    Diverticulosis    Eosinophilic esophagitis 93/73/4287   Biopsy Dec 2018   Esophageal dysphagia    Family history of breast cancer 05/2021   cancer genetic testing letter sent   Gallstones    GERD (gastroesophageal reflux disease)    History of cervical cancer    Hx of cervical cancer 01/30/2016   Hypertension    Hypothyroidism    Sleep apnea    does not use a C-PAP   Status post partial hysterectomy    Due to Cervical CA   Vaginal inclusion cyst     Past Surgical History:  Procedure Laterality Date   ABDOMINAL HYSTERECTOMY     partial hysterectomy   BREAST BIOPSY Bilateral 01/01/2016   Fibroadenoma   COLONOSCOPY WITH PROPOFOL N/A 06/27/2015   Procedure: COLONOSCOPY WITH PROPOFOL;  Surgeon: Josefine Class, MD;  Location: [facility] ENDOSCOPY;  Service: Endoscopy;  Laterality: N/A;   COLONOSCOPY WITH PROPOFOL N/A 09/23/2017   Procedure: COLONOSCOPY WITH PROPOFOL;  Surgeon: Jonathon Bellows, MD;  Location: [facility] ENDOSCOPY;  Service: Gastroenterology;  Laterality: N/A;   ESOPHAGOGASTRODUODENOSCOPY (EGD) WITH PROPOFOL N/A 07/22/2017   Procedure: ESOPHAGOGASTRODUODENOSCOPY (EGD) WITH PROPOFOL;  Surgeon: Jonathon Bellows, MD;  Location: [facility] ENDOSCOPY;  Service: Gastroenterology;  Laterality: N/A;   ESOPHAGOGASTRODUODENOSCOPY (EGD) WITH PROPOFOL N/A 03/31/2018   Procedure: ESOPHAGOGASTRODUODENOSCOPY (EGD) WITH PROPOFOL;  Surgeon: Lin Landsman, MD;   Location: [facility] ENDOSCOPY;  Service: Gastroenterology;  Laterality: N/A;   KNEE ARTHROSCOPY Right 07/22/2016   Procedure: ARTHROSCOPY KNEE debridement microfracture;  Surgeon: Leanor Kail, MD;  Location: [facility] ORS;  Service: Orthopedics;  Laterality: Right;   KNEE ARTHROSCOPY WITH MEDIAL MENISECTOMY Left 11/10/2017   Procedure: KNEE ARTHROSCOPY WITH PARTIAL MEDIAL MENISECTOMY&patella femoral debridement, & ablation;  Surgeon: Leanor Kail, MD;  Location: [facility] ORS;  Service: Orthopedics;  Laterality: Left;   TUBAL LIGATION      Prior to Admission medications   Medication Sig Start Date End Date Taking? Authorizing Provider  CATAPRES-TTS-2 0.2 MG/24HR patch Place 1 patch (0.2 mg total) onto the skin once a week. 04/21/21  Yes Gae Dry, MD  estradiol (ESTRACE) 1 MG tablet Take 1 tablet (1 mg total) by mouth daily. 04/21/21  Yes Gae Dry, MD  FLUoxetine (PROZAC) 20 MG capsule Take 1 capsule (20 mg total) by mouth daily. 12/19/20  Yes Delsa Grana, PA-C  levothyroxine (SYNTHROID) 75 MCG tablet Take 1 tablet (75 mcg total) by mouth daily before breakfast. 02/17/21  Yes Delsa Grana, PA-C  methylPREDNISolone (MEDROL DOSEPAK) 4 MG TBPK tablet  10/16/20  Yes [provider]  diclofenac (VOLTAREN) 75 MG EC tablet Take 1 tablet (75 mg total) by mouth 2 (two) times daily. Patient not taking: Reported on 06/09/2021 11/26/20   Gillis Santa, MD  estradiol (CLIMARA - DOSED IN MG/24 HR) 0.05 mg/24hr patch 0.05 mg once a week. Patient not taking: Reported on 06/09/2021 04/30/21  [provider]  Sod Picosulfate-Mag Ox-Cit Acd (CLENPIQ) 10-3.5-12 MG-GM -GM/160ML SOLN Take 1 kit by mouth as directed. At 5 PM evening before procedure, drink 1 bottle of Clenpiq, hydrate, drink (5) 8 oz of water. Then do the same thing 5 hours prior to your procedure. 05/12/21   Jonathon Bellows, MD    Allergies as of 05/12/2021 - Review Complete 05/12/2021  Allergen Reaction Noted   Methylprednisolone   05/12/2021   Percocet [oxycodone-acetaminophen] Palpitations 01/20/2015    Family History  Problem Relation Age of Onset   Diabetes Mother    Breast cancer Maternal Grandmother    Cancer Paternal Grandmother        cancer?   Cirrhosis Cousin    Breast cancer Cousin    Breast cancer Other        x4    Social History   Socioeconomic History   Marital status: Legally Separated    Spouse name: Jeneen Rinks   Number of children: 2   Years of education: Not on file   Highest education level: Not on file  Occupational History   Occupation: un employed  Tobacco Use   Smoking status: Never   Smokeless tobacco: Never  Vaping Use   Vaping Use: Never used  Substance and Sexual Activity   Alcohol use: Not Currently   Drug use: Not Currently   Sexual activity: Not Currently    Birth control/protection: Surgical  Other Topics Concern   Not on file  Social History Narrative   Not on file   Social Determinants of Health   Financial Resource Strain: Low Risk    Difficulty of Paying Living Expenses: Not hard at all  Food Insecurity: No Food Insecurity   Worried About Charity fundraiser in the Last Year: Never true   Arboriculturist in the Last Year: Never true  Transportation Needs: Unmet Transportation Needs   Lack of Transportation (Medical): Yes   Lack of Transportation (Non-Medical): Yes  Physical Activity: Inactive   Days of Exercise per Week: 0 days   Minutes of Exercise per Session: 0 min  Stress: Stress Concern Present   Feeling of Stress : To some extent  Social Connections: Socially Isolated   Frequency of Communication with Friends and Family: Once a week   Frequency of Social Gatherings with Friends and Family: Once a week   Attends Religious Services: Never   Marine scientist or Organizations: No   Attends Music therapist: Never   Marital Status: Separated  Human resources officer Violence: Not At Risk   Fear of Current or Ex-Partner: No   Emotionally  Abused: No   Physically Abused: No   Sexually Abused: No    Review of Systems: See HPI, otherwise negative ROS  Physical Exam: BP 122/84   Pulse 76   Temp (!) 96.9 F (36.1 C) (Temporal)   Resp 16   Ht '5\' 1"'  (1.549 m)   Wt 73 kg   LMP 05/09/1993 (Approximate)   SpO2 99%   BMI 30.42 kg/m  General:   Alert,  pleasant and cooperative in NAD Head:  Normocephalic and atraumatic. Neck:  Supple; no masses or thyromegaly. Lungs:  Clear throughout to auscultation.    Heart:  Regular rate and rhythm. Abdomen:  Soft, nontender and nondistended. Normal bowel sounds, without guarding, and without rebound.   Neurologic:  Alert and  oriented x4;  grossly normal neurologically.  Impression/Plan: DECLYNN LOPRESTI is here for an colonoscopy to be  performed for colon cancer screening  Risks, benefits, limitations, and alternatives regarding  colonoscopy have been reviewed with the patient.  Questions have been answered.  All parties agreeable.   Sherri Sear, MD  06/09/2021, 9:28 AM

## 2021-06-10 ENCOUNTER — Encounter: Payer: Self-pay | Admitting: Gastroenterology

## 2021-06-10 LAB — SURGICAL PATHOLOGY

## 2021-06-11 ENCOUNTER — Ambulatory Visit: Payer: Medicaid Other

## 2021-06-14 DIAGNOSIS — Z419 Encounter for procedure for purposes other than remedying health state, unspecified: Secondary | ICD-10-CM | POA: Diagnosis not present

## 2021-06-17 ENCOUNTER — Ambulatory Visit: Payer: Medicaid Other

## 2021-06-19 DIAGNOSIS — Z7689 Persons encountering health services in other specified circumstances: Secondary | ICD-10-CM | POA: Diagnosis not present

## 2021-06-20 ENCOUNTER — Encounter: Payer: Self-pay | Admitting: General Surgery

## 2021-06-20 ENCOUNTER — Ambulatory Visit: Payer: Medicaid Other

## 2021-06-20 ENCOUNTER — Other Ambulatory Visit: Payer: Self-pay

## 2021-06-20 DIAGNOSIS — Z7689 Persons encountering health services in other specified circumstances: Secondary | ICD-10-CM | POA: Diagnosis not present

## 2021-06-30 DIAGNOSIS — Z7689 Persons encountering health services in other specified circumstances: Secondary | ICD-10-CM | POA: Diagnosis not present

## 2021-06-30 DIAGNOSIS — M170 Bilateral primary osteoarthritis of knee: Secondary | ICD-10-CM | POA: Diagnosis not present

## 2021-07-03 ENCOUNTER — Telehealth: Payer: Self-pay

## 2021-07-03 NOTE — Telephone Encounter (Signed)
Patient is calling to speak with someone about her results. Please advise

## 2021-07-04 NOTE — Telephone Encounter (Signed)
Called pt back, she is asking about [facility] test results. Just had blood drawn 06/20/21 and pt is aware they take 4-6 weeks to come back.

## 2021-07-07 ENCOUNTER — Encounter: Payer: Self-pay | Admitting: Obstetrics & Gynecology

## 2021-07-07 ENCOUNTER — Other Ambulatory Visit: Payer: Self-pay

## 2021-07-07 ENCOUNTER — Ambulatory Visit (INDEPENDENT_AMBULATORY_CARE_PROVIDER_SITE_OTHER): Payer: Medicaid Other | Admitting: Obstetrics & Gynecology

## 2021-07-07 VITALS — BP 122/80 | Ht 61.0 in | Wt 174.0 lb

## 2021-07-07 DIAGNOSIS — L900 Lichen sclerosus et atrophicus: Secondary | ICD-10-CM | POA: Diagnosis not present

## 2021-07-07 DIAGNOSIS — R102 Pelvic and perineal pain: Secondary | ICD-10-CM

## 2021-07-07 DIAGNOSIS — Z7689 Persons encountering health services in other specified circumstances: Secondary | ICD-10-CM | POA: Diagnosis not present

## 2021-07-07 MED ORDER — CLONIDINE HCL 0.2 MG PO TABS: 0.2000 mg | ORAL_TABLET | Freq: Two times a day (BID) | ORAL | 3 refills | Status: DC

## 2021-07-07 NOTE — Progress Notes (Signed)
HPI:      Ms. Kristina KARWOWSKI is a 55 y.o. [id number] who LMP was Patient's last menstrual period was 05/09/1993 (approximate)., presents today for a problem visit.    Patient complains of pain in the lower abdomen and suprapubic pressure . She has had symptoms for 3 weeks. Patient also complains of  pain worse right before void.  No radiation, no modifiers . Patient denies back pain, fever, and vaginal discharge. Patient does not have a history of recurrent UTI.  Patient does not have a history of pyelonephritis. Not sexually active.  Prior hysterectomy.  No bleeding.  Has h/o Lichen Sclerosus, no recent flare ups.  PMHx: She  has a past medical history of Anxiety, Arthritis, Cancer ([place]), Cognitive impairment, mild, so stated, Depression, Diverticulosis, Eosinophilic esophagitis (81/15/7262), Esophageal dysphagia, Family history of breast cancer (05/2021), Gallstones, GERD (gastroesophageal reflux disease), History of cervical cancer, cervical cancer (01/30/2016), Hypertension, Hypothyroidism, Sleep apnea, Status post partial hysterectomy, and Vaginal inclusion cyst. Also,  has a past surgical history that includes Abdominal hysterectomy; Colonoscopy with propofol (N/A, 06/27/2015); Tubal ligation; Knee arthroscopy (Right, 07/22/2016); Esophagogastroduodenoscopy (egd) with propofol (N/A, 07/22/2017); Colonoscopy with propofol (N/A, 09/23/2017); Knee arthroscopy with medial menisectomy (Left, 11/10/2017); Esophagogastroduodenoscopy (egd) with propofol (N/A, 03/31/2018); Breast biopsy (Bilateral, 01/01/2016); and Colonoscopy with propofol (N/A, 06/09/2021)., family history includes Breast cancer in her cousin, maternal aunt, and another family member; Breast cancer (age of onset: 32) in her maternal grandmother; Cancer in her paternal grandmother; Cirrhosis in her cousin; Diabetes in her mother; Lung cancer (age of onset: 73) in her father.,  reports that she has never smoked. She has never used smokeless tobacco. She  reports that she does not currently use alcohol. She reports that she does not currently use drugs.  She has a current medication list which includes the following prescription(s): clonidine, estradiol, fluoxetine, and levothyroxine. Also, is allergic to methylprednisolone and percocet [oxycodone-acetaminophen].  Review of Systems  Constitutional:  Negative for chills, fever and malaise/fatigue.  HENT:  Negative for congestion, sinus pain and sore throat.   Eyes:  Negative for blurred vision and pain.  Respiratory:  Negative for cough and wheezing.   Cardiovascular:  Negative for chest pain and leg swelling.  Gastrointestinal:  Positive for abdominal pain. Negative for constipation, diarrhea, heartburn, nausea and vomiting.  Genitourinary:  Negative for dysuria, frequency, hematuria and urgency.  Musculoskeletal:  Negative for back pain, joint pain, myalgias and neck pain.  Skin:  Negative for itching and rash.  Neurological:  Negative for dizziness, tremors and weakness.  Endo/Heme/Allergies:  Does not bruise/bleed easily.  Psychiatric/Behavioral:  Positive for depression. The patient is not nervous/anxious and does not have insomnia.    Objective: BP 122/80   Ht 5\' 1"  (1.549 m)   Wt 174 lb (78.9 kg)   LMP 05/09/1993 (Approximate)   BMI 32.88 kg/m  Physical Exam Constitutional:      General: She is not in acute distress.    Appearance: She is well-developed.  Genitourinary:     Bladder and urethral meatus normal.     Right Labia: No rash, tenderness or lesions.    Left Labia: No tenderness, lesions or rash.    Vulva exam comments: Minimal changes from LS noted (peri clitoral).     Vaginal cuff intact.    No vaginal erythema or bleeding.      Right Adnexa: not tender and no mass present.    Left Adnexa: not tender and no mass present.    Cervix  is absent.     Uterus is absent.     Bladder exam comments: Min cystocele Tender on vag exam under bladder area.     Pelvic exam  was performed with patient in the lithotomy position.  Rectum:     Rectal exam comments: Rectocele noted.  HENT:     Head: Normocephalic and atraumatic.     Nose: Nose normal.  Abdominal:     General: There is no distension.     Palpations: Abdomen is soft.     Tenderness: There is no abdominal tenderness.  Musculoskeletal:        General: Normal range of motion.  Neurological:     Mental Status: She is alert and oriented to person, place, and time.     Cranial Nerves: No cranial nerve deficit.  Skin:    General: Skin is warm and dry.  Psychiatric:        Attention and Perception: Attention normal.        Mood and Affect: Mood normal.        Speech: Speech normal.        Behavior: Behavior normal.        Cognition and Memory: Cognition normal.        Judgment: Judgment normal.   UA NEG  ASSESSMENT/PLAN:    Problem List Items Addressed This Visit       Other   Lichen sclerosus et atrophicus   Other Visit Diagnoses     Vaginal pain    -  Primary     No clear etiology for her pain Urine culture sent Monitor for any changes/ worsening Consider CT if pain persists to look for alternative etiology  Barnett Applebaum, MD, [facility], [facility] 07/07/2021  9:40 AM

## 2021-07-10 DIAGNOSIS — Z7689 Persons encountering health services in other specified circumstances: Secondary | ICD-10-CM | POA: Diagnosis not present

## 2021-07-10 DIAGNOSIS — M170 Bilateral primary osteoarthritis of knee: Secondary | ICD-10-CM | POA: Diagnosis not present

## 2021-07-10 LAB — URINE CULTURE

## 2021-07-15 DIAGNOSIS — Z419 Encounter for procedure for purposes other than remedying health state, unspecified: Secondary | ICD-10-CM | POA: Diagnosis not present

## 2021-07-23 ENCOUNTER — Other Ambulatory Visit: Payer: Self-pay

## 2021-07-23 ENCOUNTER — Telehealth: Payer: Self-pay | Admitting: Gastroenterology

## 2021-07-23 ENCOUNTER — Telehealth: Payer: Self-pay

## 2021-07-23 ENCOUNTER — Encounter: Payer: Self-pay | Admitting: Nurse Practitioner

## 2021-07-23 ENCOUNTER — Ambulatory Visit: Payer: Medicaid Other | Admitting: Nurse Practitioner

## 2021-07-23 VITALS — BP 124/82 | HR 86 | Temp 98.2°F | Resp 16 | Ht 61.0 in | Wt 172.0 lb

## 2021-07-23 DIAGNOSIS — R1310 Dysphagia, unspecified: Secondary | ICD-10-CM

## 2021-07-23 DIAGNOSIS — Z23 Encounter for immunization: Secondary | ICD-10-CM | POA: Diagnosis not present

## 2021-07-23 DIAGNOSIS — Z7689 Persons encountering health services in other specified circumstances: Secondary | ICD-10-CM | POA: Diagnosis not present

## 2021-07-23 MED ORDER — OMEPRAZOLE 40 MG PO CPDR: 40.0000 mg | DELAYED_RELEASE_CAPSULE | Freq: Two times a day (BID) | ORAL | 2 refills | Status: DC

## 2021-07-23 NOTE — Telephone Encounter (Signed)
Please schedule upper endoscopy, patient has history of eosinophilic esophagitis, now with dysphagia Recommend patient to start taking omeprazole 40 mg p.o. twice daily before meals, send in prescription for 1 month with 2 refills  Thanks RV

## 2021-07-23 NOTE — Progress Notes (Signed)
Sen tin medicine as requested by Dr Marius Ditch will order egd as well after clinic today

## 2021-07-23 NOTE — Telephone Encounter (Signed)
Pt calling for urine test results.  [phone]

## 2021-07-23 NOTE — Telephone Encounter (Signed)
Pt aware of neg cx.

## 2021-07-23 NOTE — Progress Notes (Signed)
Also called patient and let her know her medicine was at pharmacy now for her

## 2021-07-23 NOTE — Progress Notes (Signed)
BP 124/82   Pulse 86   Temp 98.2 F (36.8 C) (Oral)   Resp 16   Ht 5\' 1"  (1.549 m)   Wt 172 lb (78 kg)   LMP 05/09/1993 (Approximate)   SpO2 97%   BMI 32.50 kg/m    Subjective:    Patient ID: Kristina Reed, female    DOB: [birth year], 55 y.o.   MRN: [id number]  HPI: Kristina Reed is a 55 y.o. female, here alone  Chief Complaint  Patient presents with   Oral Swelling    Feels like something in her throat, swelling. The more she drink it is not pushing it down. Going on for 3 weeks    Dysphagia: She says that she has felt like something is stuck in her throat towards her chest for about three weeks. She says this has happened before and she had to have her esophagus stretched.  She says she has tried drinking to get the feeling to go away and it has not helped.  She says that she can feel her food get caught on something in her lower throat.  She denies vomiting.  She does report a history of acid reflux.  She is established with Dr. Marius Ditch (GI).  She is going to call her office and make an appointment regarding this concern.    Relevant past medical, surgical, family and social history reviewed and updated as indicated. Interim medical history since our last visit reviewed. Allergies and medications reviewed and updated.  Review of Systems  Constitutional: Negative for fever or weight change.  HEENT: positive for dysphagia Respiratory: Negative for cough and shortness of breath.   Cardiovascular: Negative for chest pain or palpitations.  Gastrointestinal: Negative for abdominal pain, no bowel changes.  Musculoskeletal: Negative for gait problem or joint swelling.  Skin: Negative for rash.  Neurological: Negative for dizziness or headache.  No other specific complaints in a complete review of systems (except as listed in HPI above).      Objective:    BP 124/82   Pulse 86   Temp 98.2 F (36.8 C) (Oral)   Resp 16   Ht 5\' 1"  (1.549 m)   Wt 172 lb (78 kg)   LMP 05/09/1993  (Approximate)   SpO2 97%   BMI 32.50 kg/m   Wt Readings from Last 3 Encounters:  07/23/21 172 lb (78 kg)  07/07/21 174 lb (78.9 kg)  06/09/21 161 lb (73 kg)    Physical Exam  Constitutional: Patient appears well-developed and well-nourished.   No distress.  HEENT: head atraumatic, normocephalic, pupils equal and reactive to light, , neck supple, throat within normal limits Cardiovascular: Normal rate, regular rhythm and normal heart sounds.  No murmur heard. No BLE edema. Pulmonary/Chest: Effort normal and breath sounds normal. No respiratory distress. Abdominal: Soft.  There is no tenderness. Psychiatric: Patient has a normal mood and affect. behavior is normal. Judgment and thought content normal.   Results for orders placed or performed in visit on 07/07/21  Urine Culture   Specimen: Urine, Clean Catch   UR  Result Value Ref Range   Urine Culture, Routine Final report    Organism ID, Bacteria Comment       Assessment & Plan:   1. Dysphagia, unspecified type -call Dr. Marius Ditch (GI) office for appointment  2. Need for influenza vaccination  - Flu Vaccine QUAD 6+ mos PF IM (Fluarix Quad PF)   Follow up plan: Return if symptoms worsen or fail to  improve.

## 2021-07-23 NOTE — Telephone Encounter (Signed)
No, that is considered a negative result

## 2021-07-23 NOTE — Telephone Encounter (Signed)
Called patien tback scheduled endoscopy for her on 08/25/2021

## 2021-07-31 ENCOUNTER — Other Ambulatory Visit: Payer: Self-pay | Admitting: Obstetrics & Gynecology

## 2021-08-01 ENCOUNTER — Telehealth: Payer: Self-pay

## 2021-08-01 NOTE — Telephone Encounter (Signed)
Pt calling; states recv'd text stating 'test is ready'.  [phone]  Called to see what test; pt couldn't say; adv had already told her about her urine culture; asked if she has heard from her pathology and she states no.  Adv will send msg to [facility].

## 2021-08-01 NOTE — Telephone Encounter (Signed)
Adv pt all PH did was a urine cx; adv she needs to contact Dr. Marius Ditch for pathology results.

## 2021-08-01 NOTE — Telephone Encounter (Signed)
Urine culture is all I did, and it was clear.  There is no pathology.

## 2021-08-14 DIAGNOSIS — Z419 Encounter for procedure for purposes other than remedying health state, unspecified: Secondary | ICD-10-CM | POA: Diagnosis not present

## 2021-08-18 DIAGNOSIS — Z7689 Persons encountering health services in other specified circumstances: Secondary | ICD-10-CM | POA: Diagnosis not present

## 2021-08-18 DIAGNOSIS — M170 Bilateral primary osteoarthritis of knee: Secondary | ICD-10-CM | POA: Diagnosis not present

## 2021-08-25 ENCOUNTER — Ambulatory Visit: Payer: Medicaid Other | Admitting: Certified Registered"

## 2021-08-25 ENCOUNTER — Ambulatory Visit
Admission: RE | Admit: 2021-08-25 | Discharge: 2021-08-25 | Disposition: A | Discharge status: Home / Self Care | Payer: Medicaid Other | Attending: Gastroenterology | Admitting: Gastroenterology

## 2021-08-25 ENCOUNTER — Encounter: Payer: Self-pay | Admitting: Gastroenterology

## 2021-08-25 ENCOUNTER — Encounter
Admission: RE | Disposition: A | Discharge status: Home / Self Care | Payer: Self-pay | Source: Home / Self Care | Attending: Gastroenterology

## 2021-08-25 DIAGNOSIS — G4730 Sleep apnea, unspecified: Secondary | ICD-10-CM | POA: Diagnosis not present

## 2021-08-25 DIAGNOSIS — F32A Depression, unspecified: Secondary | ICD-10-CM | POA: Diagnosis not present

## 2021-08-25 DIAGNOSIS — R1314 Dysphagia, pharyngoesophageal phase: Secondary | ICD-10-CM | POA: Insufficient documentation

## 2021-08-25 DIAGNOSIS — K259 Gastric ulcer, unspecified as acute or chronic, without hemorrhage or perforation: Secondary | ICD-10-CM | POA: Diagnosis not present

## 2021-08-25 DIAGNOSIS — K2101 Gastro-esophageal reflux disease with esophagitis, with bleeding: Secondary | ICD-10-CM | POA: Insufficient documentation

## 2021-08-25 DIAGNOSIS — Z79899 Other long term (current) drug therapy: Secondary | ICD-10-CM | POA: Insufficient documentation

## 2021-08-25 DIAGNOSIS — I10 Essential (primary) hypertension: Secondary | ICD-10-CM | POA: Insufficient documentation

## 2021-08-25 DIAGNOSIS — R1310 Dysphagia, unspecified: Secondary | ICD-10-CM

## 2021-08-25 DIAGNOSIS — K2210 Ulcer of esophagus without bleeding: Secondary | ICD-10-CM

## 2021-08-25 DIAGNOSIS — K2950 Unspecified chronic gastritis without bleeding: Secondary | ICD-10-CM | POA: Diagnosis not present

## 2021-08-25 DIAGNOSIS — K3189 Other diseases of stomach and duodenum: Secondary | ICD-10-CM | POA: Diagnosis not present

## 2021-08-25 DIAGNOSIS — N289 Disorder of kidney and ureter, unspecified: Secondary | ICD-10-CM | POA: Diagnosis not present

## 2021-08-25 DIAGNOSIS — G4733 Obstructive sleep apnea (adult) (pediatric): Secondary | ICD-10-CM | POA: Diagnosis not present

## 2021-08-25 DIAGNOSIS — K209 Esophagitis, unspecified without bleeding: Secondary | ICD-10-CM | POA: Diagnosis not present

## 2021-08-25 DIAGNOSIS — F419 Anxiety disorder, unspecified: Secondary | ICD-10-CM | POA: Insufficient documentation

## 2021-08-25 HISTORY — PX: ESOPHAGOGASTRODUODENOSCOPY (EGD) WITH PROPOFOL: SHX5813

## 2021-08-25 SURGERY — ESOPHAGOGASTRODUODENOSCOPY (EGD) WITH PROPOFOL
Anesthesia: General

## 2021-08-25 MED ORDER — SODIUM CHLORIDE 0.9 % IV SOLN: INTRAVENOUS | Status: DC

## 2021-08-25 MED ORDER — LIDOCAINE 2% (20 MG/ML) 5 ML SYRINGE
INTRAMUSCULAR | Status: DC | PRN
  Administered 2021-08-25: 20 mg via INTRAVENOUS

## 2021-08-25 MED ORDER — PROPOFOL 500 MG/50ML IV EMUL
INTRAVENOUS | Status: DC | PRN
  Administered 2021-08-25: 120 ug/kg/min via INTRAVENOUS

## 2021-08-25 MED ORDER — PROPOFOL 10 MG/ML IV BOLUS
INTRAVENOUS | Status: DC | PRN
Start: 2021-08-25 — End: 2021-08-25
  Administered 2021-08-25: 100 mg via INTRAVENOUS

## 2021-08-25 NOTE — Op Note (Signed)
[facility] Gastroenterology Patient Name: Kristina Reed Procedure Date: 08/25/2021 10:11 AM MRN: [id number] Account #: [id number] Date of Birth: Aug 07, 1966 Admit Type: Outpatient Age: 55 Room: [facility] ENDO ROOM 1 Gender: Female Note Status: Finalized Instrument Name: Upper Endoscope [id number] Procedure:             Upper GI endoscopy Indications:           Esophageal dysphagia, Eosinophilic esophagitis,                         Follow-up of eosinophilic esophagitis Providers:             Lin Landsman MD, MD Referring MD:          Delsa Grana (Referring MD) Medicines:             General Anesthesia Complications:         No immediate complications. Estimated blood loss: None. Procedure:             Pre-Anesthesia Assessment:                        - Prior to the procedure, a History and Physical was                         performed, and patient medications and allergies were                         reviewed. The patient is competent. The risks and                         benefits of the procedure and the sedation options and                         risks were discussed with the patient. All questions                         were answered and informed consent was obtained.                         Patient identification and proposed procedure were                         verified by the physician, the nurse, the                         anesthesiologist, the anesthetist and the technician                         in the pre-procedure area in the procedure room in the                         endoscopy suite. Mental Status Examination: alert and                         oriented. Airway Examination: normal oropharyngeal                         airway and neck mobility. Respiratory Examination:  clear to auscultation. CV Examination: normal.                         Prophylactic Antibiotics: The patient does not require                          prophylactic antibiotics. Prior Anticoagulants: The                         patient has taken no previous anticoagulant or                         antiplatelet agents. ASA Grade Assessment: III - A                         patient with severe systemic disease. After reviewing                         the risks and benefits, the patient was deemed in                         satisfactory condition to undergo the procedure. The                         anesthesia plan was to use general anesthesia.                         Immediately prior to administration of medications,                         the patient was re-assessed for adequacy to receive                         sedatives. The heart rate, respiratory rate, oxygen                         saturations, blood pressure, adequacy of pulmonary                         ventilation, and response to care were monitored                         throughout the procedure. The physical status of the                         patient was re-assessed after the procedure.                        After obtaining informed consent, the endoscope was                         passed under direct vision. Throughout the procedure,                         the patient's blood pressure, pulse, and oxygen                         saturations were monitored continuously. The Endoscope  was introduced through the mouth, and advanced to the                         second part of duodenum. The upper GI endoscopy was                         accomplished without difficulty. The patient tolerated                         the procedure well. Findings:      Patchy mildly erythematous mucosa without active bleeding and with no       stigmata of bleeding was found in the duodenal bulb and in the second       portion of the duodenum. Biopsies were taken with a cold forceps for       histology.      Multiple dispersed diminutive erosions with stigmata of recent  bleeding       were found in the gastric antrum. Biopsies were taken with a cold       forceps for Helicobacter pylori testing.      The gastric body and incisura were normal. Biopsies were taken with a       cold forceps for histology. Estimated blood loss: none.      The cardia and gastric fundus were normal on retroflexion.      LA Grade A (one or more mucosal breaks less than 5 mm, not extending       between tops of 2 mucosal folds) esophagitis with bleeding was found in       the lower third of the esophagus.      Esophagogastric landmarks were identified: the gastroesophageal junction       was found at 34 cm from the incisors.      The upper third of the esophagus and middle third of the esophagus were       normal. Biopsies were obtained from the proximal and distal esophagus       with cold forceps for histology of suspected eosinophilic esophagitis. Impression:            - Erythematous duodenopathy. Biopsied.                        - Erosive gastropathy with stigmata of recent                         bleeding. Biopsied.                        - Normal gastric body and incisura. Biopsied.                        - LA Grade A reflux esophagitis with bleeding.                        - Esophagogastric landmarks identified.                        - Normal upper third of esophagus and middle third of                         esophagus. Biopsied. Recommendation:        -  Discharge patient to home (with escort).                        - Resume previous diet today.                        - Continue present medications.                        - Await pathology results.                        - Return to my office in 4 weeks. Procedure Code(s):     --- Professional ---                        [phone], Esophagogastroduodenoscopy, flexible,                         transoral; with biopsy, single or multiple Diagnosis Code(s):     --- Professional ---                        K31.89, Other  diseases of stomach and duodenum                        K92.2, Gastrointestinal hemorrhage, unspecified                        K21.01, Gastro-esophageal reflux disease with                         esophagitis, with bleeding                        R13.14, Dysphagia, pharyngoesophageal phase                        Y77.4, Eosinophilic esophagitis CPT copyright 2019 American Medical Association. All rights reserved. The codes documented in this report are preliminary and upon coder review may  be revised to meet current compliance requirements. Dr. Ulyess Mort Lin Landsman MD, MD 08/25/2021 10:30:41 AM This report has been signed electronically. Number of Addenda: 0 Note Initiated On: 08/25/2021 10:11 AM Estimated Blood Loss:  Estimated blood loss: none.      [facility]

## 2021-08-25 NOTE — Anesthesia Preprocedure Evaluation (Addendum)
Anesthesia Evaluation  Patient identified by MRN, date of birth, ID band Patient awake    Reviewed: Allergy & Precautions, H&P , NPO status , Patient's Chart, lab work & pertinent test results, reviewed documented beta blocker date and time   Airway Mallampati: II   Neck ROM: full    Dental  (+) Poor Dentition   Pulmonary sleep apnea ,    Pulmonary exam normal        Cardiovascular Exercise Tolerance: Good hypertension, On Medications negative cardio ROS Normal cardiovascular exam Rhythm:regular Rate:Normal     Neuro/Psych  Headaches, PSYCHIATRIC DISORDERS Anxiety Depression  Neuromuscular disease    GI/Hepatic Neg liver ROS, GERD  Medicated,  Endo/Other  Hypothyroidism   Renal/GU Renal disease  negative genitourinary   Musculoskeletal   Abdominal   Peds  Hematology negative hematology ROS (+)   Anesthesia Other Findings Past Medical History: No date: Anxiety No date: Arthritis     Comment:  right knee and right elbow No date: Cancer [facility])     Comment:  Cervical CA with partial hysterectomy. No date: Cognitive impairment, mild, so stated No date: Depression No date: Diverticulosis 09/62/8366: Eosinophilic esophagitis     Comment:  Biopsy Dec 2018 No date: Esophageal dysphagia 05/2021: Family history of breast cancer     Comment:  cancer genetic testing letter sent No date: Gallstones No date: GERD (gastroesophageal reflux disease) No date: History of cervical cancer 01/30/2016: Hx of cervical cancer No date: Hypertension No date: Hypothyroidism No date: Sleep apnea     Comment:  does not use a C-PAP No date: Status post partial hysterectomy     Comment:  Due to Cervical CA No date: Vaginal inclusion cyst Past Surgical History: No date: ABDOMINAL HYSTERECTOMY     Comment:  partial hysterectomy 01/01/2016: BREAST BIOPSY; Bilateral     Comment:  Fibroadenoma 06/27/2015: COLONOSCOPY WITH PROPOFOL;  N/A     Comment:  Procedure: COLONOSCOPY WITH PROPOFOL;  Surgeon: Josefine Class, MD;  Location: [facility] ENDOSCOPY;  Service:               Endoscopy;  Laterality: N/A; 09/23/2017: COLONOSCOPY WITH PROPOFOL; N/A     Comment:  Procedure: COLONOSCOPY WITH PROPOFOL;  Surgeon: Jonathon Bellows, MD;  Location: [facility] ENDOSCOPY;  Service:               Gastroenterology;  Laterality: N/A; 06/09/2021: COLONOSCOPY WITH PROPOFOL; N/A     Comment:  Procedure: COLONOSCOPY WITH PROPOFOL;  Surgeon: Lin Landsman, MD;  Location: [facility] ENDOSCOPY;  Service:               Gastroenterology;  Laterality: N/A; 07/22/2017: ESOPHAGOGASTRODUODENOSCOPY (EGD) WITH PROPOFOL; N/A     Comment:  Procedure: ESOPHAGOGASTRODUODENOSCOPY (EGD) WITH               PROPOFOL;  Surgeon: Jonathon Bellows, MD;  Location: [facility]               ENDOSCOPY;  Service: Gastroenterology;  Laterality: N/A; 03/31/2018: ESOPHAGOGASTRODUODENOSCOPY (EGD) WITH PROPOFOL; N/A     Comment:  Procedure: ESOPHAGOGASTRODUODENOSCOPY (EGD) WITH               PROPOFOL;  Surgeon: Lin Landsman, MD;  Location:  [facility] ENDOSCOPY;  Service: Gastroenterology;  Laterality:               N/A; 07/22/2016: KNEE ARTHROSCOPY; Right     Comment:  Procedure: ARTHROSCOPY KNEE debridement microfracture;                Surgeon: Leanor Kail, MD;  Location: [facility] ORS;                Service: Orthopedics;  Laterality: Right; 11/10/2017: KNEE ARTHROSCOPY WITH MEDIAL MENISECTOMY; Left     Comment:  Procedure: KNEE ARTHROSCOPY WITH PARTIAL MEDIAL               MENISECTOMY&patella femoral debridement, & ablation;                Surgeon: Leanor Kail, MD;  Location: [facility] ORS;                Service: Orthopedics;  Laterality: Left; No date: TUBAL LIGATION BMI    Body Mass Index: 31.79 kg/m     Reproductive/Obstetrics negative OB ROS                             Anesthesia Physical Anesthesia  Plan  ASA: 3  Anesthesia Plan: General   Post-op Pain Management:    Induction:   PONV Risk Score and Plan:   Airway Management Planned:   Additional Equipment:   Intra-op Plan:   Post-operative Plan:   Informed Consent: I have reviewed the patients History and Physical, chart, labs and discussed the procedure including the risks, benefits and alternatives for the proposed anesthesia with the patient or authorized representative who has indicated his/her understanding and acceptance.     Dental Advisory Given  Plan Discussed with: CRNA  Anesthesia Plan Comments:         Anesthesia Quick Evaluation

## 2021-08-25 NOTE — H&P (Signed)
Cephas Darby, MD [address]  [place]  [place], [place] [address]  Main: [phone]  Fax: [phone] Pager: [phone]  Primary Care Physician:  Delsa Grana, PA-C Primary Gastroenterologist:  Dr. Cephas Darby  Pre-Procedure History & Physical: HPI:  Kristina Reed is a 55 y.o. female is here for an endoscopy.   Past Medical History:  Diagnosis Date   Anxiety    Arthritis    right knee and right elbow   Cancer ([place])    Cervical CA with partial hysterectomy.   Cognitive impairment, mild, so stated    Depression    Diverticulosis    Eosinophilic esophagitis 27/74/1287   Biopsy Dec 2018   Esophageal dysphagia    Family history of breast cancer 05/2021   cancer genetic testing letter sent   Gallstones    GERD (gastroesophageal reflux disease)    History of cervical cancer    Hx of cervical cancer 01/30/2016   Hypertension    Hypothyroidism    Sleep apnea    does not use a C-PAP   Status post partial hysterectomy    Due to Cervical CA   Vaginal inclusion cyst     Past Surgical History:  Procedure Laterality Date   ABDOMINAL HYSTERECTOMY     partial hysterectomy   BREAST BIOPSY Bilateral 01/01/2016   Fibroadenoma   COLONOSCOPY WITH PROPOFOL N/A 06/27/2015   Procedure: COLONOSCOPY WITH PROPOFOL;  Surgeon: Josefine Class, MD;  Location: [facility] ENDOSCOPY;  Service: Endoscopy;  Laterality: N/A;   COLONOSCOPY WITH PROPOFOL N/A 09/23/2017   Procedure: COLONOSCOPY WITH PROPOFOL;  Surgeon: Jonathon Bellows, MD;  Location: [facility] ENDOSCOPY;  Service: Gastroenterology;  Laterality: N/A;   COLONOSCOPY WITH PROPOFOL N/A 06/09/2021   Procedure: COLONOSCOPY WITH PROPOFOL;  Surgeon: Lin Landsman, MD;  Location: [facility] ENDOSCOPY;  Service: Gastroenterology;  Laterality: N/A;   ESOPHAGOGASTRODUODENOSCOPY (EGD) WITH PROPOFOL N/A 07/22/2017   Procedure: ESOPHAGOGASTRODUODENOSCOPY (EGD) WITH PROPOFOL;  Surgeon: Jonathon Bellows, MD;  [facility] ENDOSCOPY;  Service:  Gastroenterology;  Laterality: N/A;   ESOPHAGOGASTRODUODENOSCOPY (EGD) WITH PROPOFOL N/A 03/31/2018   Procedure: ESOPHAGOGASTRODUODENOSCOPY (EGD) WITH PROPOFOL;  Surgeon: Lin Landsman, MD;  Location: [facility] ENDOSCOPY;  Service: Gastroenterology;  Laterality: N/A;   KNEE ARTHROSCOPY Right 07/22/2016   Procedure: ARTHROSCOPY KNEE debridement microfracture;  Surgeon: Leanor Kail, MD;  Location: [facility] ORS;  Service: Orthopedics;  Laterality: Right;   KNEE ARTHROSCOPY WITH MEDIAL MENISECTOMY Left 11/10/2017   Procedure: KNEE ARTHROSCOPY WITH PARTIAL MEDIAL MENISECTOMY&patella femoral debridement, & ablation;  Surgeon: Leanor Kail, MD;  Location: [facility] ORS;  Service: Orthopedics;  Laterality: Left;   TUBAL LIGATION      Prior to Admission medications   Medication Sig Start Date End Date Taking? Authorizing Provider  cloNIDine (CATAPRES) 0.2 MG tablet Take 1 tablet (0.2 mg total) by mouth 2 (two) times daily. 07/07/21  Yes Gae Dry, MD  FLUoxetine (PROZAC) 20 MG capsule Take 1 capsule (20 mg total) by mouth daily. 12/19/20  Yes Delsa Grana, PA-C  levothyroxine (SYNTHROID) 75 MCG tablet Take 1 tablet (75 mcg total) by mouth daily before breakfast. 02/17/21  Yes Delsa Grana, PA-C  meloxicam (MOBIC) 15 MG tablet Take 15 mg by mouth daily. 06/30/21  Yes [provider]  omeprazole (PRILOSEC) 40 MG capsule Take 1 capsule (40 mg total) by mouth 2 (two) times daily before a meal. 07/23/21 08/25/21 Yes Bryer Cozzolino, Tally Due, MD    Allergies as of 07/23/2021 - Review Complete 07/23/2021  Allergen Reaction Noted  Methylprednisolone  05/12/2021   Prednisone Nausea And Vomiting 07/23/2021   Percocet [oxycodone-acetaminophen] Palpitations 01/20/2015    Family History  Problem Relation Age of Onset   Diabetes Mother    Lung cancer Father 78   Breast cancer Maternal Grandmother 1   Cancer Paternal Grandmother        cancer?   Breast cancer Maternal Aunt        80s   Cirrhosis  Cousin    Breast cancer Cousin    Breast cancer Other        x4    Social History   Socioeconomic History   Marital status: Legally Separated    Spouse name: Jeneen Rinks   Number of children: 2   Years of education: Not on file   Highest education level: Not on file  Occupational History   Occupation: un employed  Tobacco Use   Smoking status: Never   Smokeless tobacco: Never  Vaping Use   Vaping Use: Never used  Substance and Sexual Activity   Alcohol use: Not Currently   Drug use: Not Currently   Sexual activity: Not Currently    Birth control/protection: Surgical  Other Topics Concern   Not on file  Social History Narrative   Not on file   Social Determinants of Health   Financial Resource Strain: Low Risk    Difficulty of Paying Living Expenses: Not hard at all  Food Insecurity: No Food Insecurity   Worried About Charity fundraiser in the Last Year: Never true   Arboriculturist in the Last Year: Never true  Transportation Needs: Unmet Transportation Needs   Lack of Transportation (Medical): Yes   Lack of Transportation (Non-Medical): Yes  Physical Activity: Inactive   Days of Exercise per Week: 0 days   Minutes of Exercise per Session: 0 min  Stress: Stress Concern Present   Feeling of Stress : To some extent  Social Connections: Socially Isolated   Frequency of Communication with Friends and Family: Once a week   Frequency of Social Gatherings with Friends and Family: Once a week   Attends Religious Services: Never   Marine scientist or Organizations: No   Attends Music therapist: Never   Marital Status: Separated  Human resources officer Violence: Not At Risk   Fear of Current or Ex-Partner: No   Emotionally Abused: No   Physically Abused: No   Sexually Abused: No    Review of Systems: See HPI, otherwise negative ROS  Physical Exam: BP (!) 123/98   Pulse 86   Temp (!) 96.7 F (35.9 C) (Temporal)   Resp 18   Ht 5' 1.5" (1.562 m)   Wt  77.6 kg   LMP 05/09/1993 (Approximate)   SpO2 100%   BMI 31.79 kg/m  General:   Alert,  pleasant and cooperative in NAD Head:  Normocephalic and atraumatic. Neck:  Supple; no masses or thyromegaly. Lungs:  Clear throughout to auscultation.    Heart:  Regular rate and rhythm. Abdomen:  Soft, nontender and nondistended. Normal bowel sounds, without guarding, and without rebound.   Neurologic:  Alert and  oriented x4;  grossly normal neurologically.  Impression/Plan: Kristina Reed is here for an endoscopy to be performed for Eosinophilic esophagitis, dysphagia  Risks, benefits, limitations, and alternatives regarding  endoscopy have been reviewed with the patient.  Questions have been answered.  All parties agreeable.   Sherri Sear, MD  08/25/2021, 9:46 AM

## 2021-08-25 NOTE — Transfer of Care (Signed)
Immediate Anesthesia Transfer of Care Note  Patient: Kristina Reed  Procedure(s) Performed: ESOPHAGOGASTRODUODENOSCOPY (EGD) WITH PROPOFOL  Patient Location: Endoscopy Unit  Anesthesia Type:General  Level of Consciousness: awake  Airway & Oxygen Therapy: Patient Spontanous Breathing  Post-op Assessment: Report given to RN and Post -op Vital signs reviewed and stable  Post vital signs: Reviewed  Last Vitals:  Vitals Value Taken Time  BP 129/94 08/25/21 1031  Temp    Pulse 92 08/25/21 1031  Resp 14 08/25/21 1031  SpO2 94 % 08/25/21 1031    Last Pain:  Vitals:   08/25/21 1031  TempSrc:   PainSc: Asleep         Complications: No notable events documented.

## 2021-08-25 NOTE — Anesthesia Postprocedure Evaluation (Signed)
Anesthesia Post Note  Patient: Kristina Reed  Procedure(s) Performed: ESOPHAGOGASTRODUODENOSCOPY (EGD) WITH PROPOFOL  Patient location during evaluation: PACU Anesthesia Type: General Level of consciousness: awake and alert Pain management: pain level controlled Vital Signs Assessment: post-procedure vital signs reviewed and stable Respiratory status: spontaneous breathing, nonlabored ventilation, respiratory function stable and patient connected to nasal cannula oxygen Cardiovascular status: blood pressure returned to baseline and stable Postop Assessment: no apparent nausea or vomiting Anesthetic complications: no   No notable events documented.   Last Vitals:  Vitals:   08/25/21 1041 08/25/21 1051  BP: (!) 132/97 102/84  Pulse: 93 90  Resp: 19 10  Temp:  (!) 35.8 C  SpO2: 96% 95%    Last Pain:  Vitals:   08/25/21 1051  TempSrc: Temporal  PainSc: 0-No pain                 Molli Barrows

## 2021-08-26 ENCOUNTER — Encounter: Payer: Self-pay | Admitting: Gastroenterology

## 2021-08-26 ENCOUNTER — Telehealth: Payer: Self-pay

## 2021-08-26 LAB — SURGICAL PATHOLOGY

## 2021-08-26 NOTE — Telephone Encounter (Signed)
Called patient to get her scheduled for follow up in 4 weeks has appointment for 09/23/2021 at 115

## 2021-08-27 ENCOUNTER — Ambulatory Visit: Payer: Medicaid Other | Admitting: Obstetrics and Gynecology

## 2021-08-27 ENCOUNTER — Telehealth: Payer: Self-pay

## 2021-08-27 NOTE — Telephone Encounter (Signed)
Called patient she understands her results and will see [name] 09/23/2021 for follow up

## 2021-08-27 NOTE — Telephone Encounter (Signed)
-----   Message from Lin Landsman, MD sent at 08/27/2021  4:31 PM EST ----- Please inform patient that the pathology results from upper endoscopy are suggestive of reflux esophagitis.  She should continue taking acid reflux medication.  I will see her for follow-up on 09/23/2021 to discuss next steps  Rohini Vanga

## 2021-08-31 ENCOUNTER — Emergency Department
Admission: EM | Admit: 2021-08-31 | Discharge: 2021-08-31 | Disposition: A | Discharge status: Home / Self Care | Payer: Medicaid Other | Attending: Emergency Medicine | Admitting: Emergency Medicine

## 2021-08-31 ENCOUNTER — Other Ambulatory Visit: Payer: Self-pay

## 2021-08-31 ENCOUNTER — Encounter: Payer: Self-pay | Admitting: Emergency Medicine

## 2021-08-31 ENCOUNTER — Emergency Department: Payer: Medicaid Other

## 2021-08-31 DIAGNOSIS — Z8541 Personal history of malignant neoplasm of cervix uteri: Secondary | ICD-10-CM | POA: Insufficient documentation

## 2021-08-31 DIAGNOSIS — Z20822 Contact with and (suspected) exposure to covid-19: Secondary | ICD-10-CM | POA: Insufficient documentation

## 2021-08-31 DIAGNOSIS — R1110 Vomiting, unspecified: Secondary | ICD-10-CM | POA: Diagnosis not present

## 2021-08-31 DIAGNOSIS — R112 Nausea with vomiting, unspecified: Secondary | ICD-10-CM | POA: Insufficient documentation

## 2021-08-31 DIAGNOSIS — Z79899 Other long term (current) drug therapy: Secondary | ICD-10-CM | POA: Diagnosis not present

## 2021-08-31 DIAGNOSIS — R110 Nausea: Secondary | ICD-10-CM

## 2021-08-31 DIAGNOSIS — R1030 Lower abdominal pain, unspecified: Secondary | ICD-10-CM | POA: Insufficient documentation

## 2021-08-31 DIAGNOSIS — E039 Hypothyroidism, unspecified: Secondary | ICD-10-CM | POA: Diagnosis not present

## 2021-08-31 DIAGNOSIS — I10 Essential (primary) hypertension: Secondary | ICD-10-CM | POA: Insufficient documentation

## 2021-08-31 DIAGNOSIS — R52 Pain, unspecified: Secondary | ICD-10-CM | POA: Diagnosis not present

## 2021-08-31 DIAGNOSIS — K760 Fatty (change of) liver, not elsewhere classified: Secondary | ICD-10-CM | POA: Diagnosis not present

## 2021-08-31 DIAGNOSIS — R531 Weakness: Secondary | ICD-10-CM | POA: Diagnosis not present

## 2021-08-31 LAB — RESP PANEL BY RT-PCR (FLU A&B, COVID) ARPGX2
Influenza A by PCR: NEGATIVE
Influenza B by PCR: NEGATIVE
SARS Coronavirus 2 by RT PCR: NEGATIVE

## 2021-08-31 LAB — COMPREHENSIVE METABOLIC PANEL
ALT: 46 U/L — ABNORMAL HIGH (ref 0–44)
AST: 34 U/L (ref 15–41)
Albumin: 4 g/dL (ref 3.5–5.0)
Alkaline Phosphatase: 70 U/L (ref 38–126)
Anion gap: 8 (ref 5–15)
BUN: 10 mg/dL (ref 6–20)
CO2: 28 mmol/L (ref 22–32)
Calcium: 8.8 mg/dL — ABNORMAL LOW (ref 8.9–10.3)
Chloride: 101 mmol/L (ref 98–111)
Creatinine, Ser: 0.85 mg/dL (ref 0.44–1.00)
GFR, Estimated: >60 mL/min (ref >60)
Glucose, Bld: 96 mg/dL (ref 70–99)
Potassium: 3.1 mmol/L — ABNORMAL LOW (ref 3.5–5.1)
Sodium: 137 mmol/L (ref 135–145)
Total Bilirubin: 0.6 mg/dL (ref 0.3–1.2)
Total Protein: 7.9 g/dL (ref 6.5–8.1)

## 2021-08-31 LAB — CBC
HCT: 39.5 % (ref 36.0–46.0)
Hemoglobin: 13.2 g/dL (ref 12.0–15.0)
MCH: 29 pg (ref 26.0–34.0)
MCHC: 33.4 g/dL (ref 30.0–36.0)
MCV: 86.8 fL (ref 80.0–100.0)
Platelets: 276 10*3/uL (ref 150–400)
RBC: 4.55 MIL/uL (ref 3.87–5.11)
RDW: 12.7 % (ref 11.5–15.5)
WBC: 6.2 10*3/uL (ref 4.0–10.5)
nRBC: 0 % (ref 0.0–0.2)

## 2021-08-31 LAB — URINALYSIS, ROUTINE W REFLEX MICROSCOPIC
Bilirubin Urine: NEGATIVE
Glucose, UA: NEGATIVE mg/dL
Ketones, ur: 20 mg/dL — AB
Nitrite: NEGATIVE
Protein, ur: 30 mg/dL — AB
Specific Gravity, Urine: 1.03 (ref 1.005–1.030)
pH: 5 (ref 5.0–8.0)

## 2021-08-31 LAB — LIPASE, BLOOD: Lipase: 29 U/L (ref 11–51)

## 2021-08-31 MED ORDER — SODIUM CHLORIDE 0.9 % IV BOLUS
1000.0000 mL | Freq: Once | INTRAVENOUS | Status: AC
  Administered 2021-08-31: 18:00:00 1000 mL via INTRAVENOUS

## 2021-08-31 MED ORDER — IOHEXOL 300 MG/ML  SOLN
100.0000 mL | Freq: Once | INTRAMUSCULAR | Status: AC | PRN
  Administered 2021-08-31: 19:00:00 100 mL via INTRAVENOUS
  Filled 2021-08-31: qty 100

## 2021-08-31 MED ORDER — POTASSIUM CHLORIDE CRYS ER 20 MEQ PO TBCR
40.0000 meq | EXTENDED_RELEASE_TABLET | Freq: Once | ORAL | Status: AC
  Administered 2021-08-31: 18:00:00 40 meq via ORAL
  Filled 2021-08-31: qty 2

## 2021-08-31 MED ORDER — ONDANSETRON 4 MG PO TBDP
4.0000 mg | ORAL_TABLET | Freq: Once | ORAL | Status: AC
  Administered 2021-08-31: 18:00:00 4 mg via ORAL
  Filled 2021-08-31: qty 1

## 2021-08-31 MED ORDER — SODIUM CHLORIDE 0.9 % IV SOLN
1.0000 g | Freq: Once | INTRAVENOUS | Status: AC
  Administered 2021-08-31: 20:00:00 1 g via INTRAVENOUS
  Filled 2021-08-31: qty 10

## 2021-08-31 MED ORDER — POTASSIUM CHLORIDE CRYS ER 20 MEQ PO TBCR: 20.0000 meq | EXTENDED_RELEASE_TABLET | Freq: Every day | ORAL | 0 refills | Status: DC

## 2021-08-31 MED ORDER — ONDANSETRON HCL 4 MG PO TABS: 4.0000 mg | ORAL_TABLET | Freq: Three times a day (TID) | ORAL | 1 refills | Status: AC | PRN

## 2021-08-31 MED ORDER — CEPHALEXIN 500 MG PO CAPS: 500.0000 mg | ORAL_CAPSULE | Freq: Four times a day (QID) | ORAL | 0 refills | Status: AC

## 2021-08-31 NOTE — Discharge Instructions (Addendum)
You can take Zofran up to three times daily.  

## 2021-08-31 NOTE — ED Triage Notes (Signed)
Pt in via EMS from home with c/o vomiting. 150/100, HR 73, 98% RA

## 2021-08-31 NOTE — ED Triage Notes (Signed)
Pt reports has thrown up a few times every day for the last 2 days. Pt states last vomited at 10:30 today. Pt denies pain, SOB, bodyaches or any other sx's, reports just throws up every day

## 2021-08-31 NOTE — ED Provider Notes (Signed)
[facility]-EMERGENCY DEPARTMENT  ____________________________________________  Time seen: Approximately 5:29 PM  I have reviewed the triage vital signs and the nursing notes.   HISTORY  Chief Complaint Emesis   Historian Patient     HPI Kristina Reed is a 55 y.o. female presents to the emergency department with nausea and vomiting for the past 4 days.  Patient reports that she has vomiting every time she eats.  She reports that she has had some lower abdominal discomfort and pain with urination.  Denies low back pain, fever or diarrhea.  Patient had recent upper endoscopy with findings suggestive of reflux esophagitis and was started on acid reflux medication.  She denies current chest pain, chest tightness or shortness of breath.  No falls or mechanisms of trauma.  No sick contacts in the home with similar symptoms.   Past Medical History:  Diagnosis Date   Anxiety    Arthritis    right knee and right elbow   Cancer ([place])    Cervical CA with partial hysterectomy.   Cognitive impairment, mild, so stated    Depression    Diverticulosis    Eosinophilic esophagitis 34/19/6222   Biopsy Dec 2018   Esophageal dysphagia    Family history of breast cancer 05/2021   cancer genetic testing letter sent   Gallstones    GERD (gastroesophageal reflux disease)    History of cervical cancer    Hx of cervical cancer 01/30/2016   Hypertension    Hypothyroidism    Sleep apnea    does not use a C-PAP   Status post partial hysterectomy    Due to Cervical CA   Vaginal inclusion cyst      Immunizations up to date:  Yes.     Past Medical History:  Diagnosis Date   Anxiety    Arthritis    right knee and right elbow   Cancer ([place])    Cervical CA with partial hysterectomy.   Cognitive impairment, mild, so stated    Depression    Diverticulosis    Eosinophilic esophagitis 97/98/9211   Biopsy Dec 2018   Esophageal dysphagia    Family history of breast cancer 05/2021   cancer  genetic testing letter sent   Gallstones    GERD (gastroesophageal reflux disease)    History of cervical cancer    Hx of cervical cancer 01/30/2016   Hypertension    Hypothyroidism    Sleep apnea    does not use a C-PAP   Status post partial hysterectomy    Due to Cervical CA   Vaginal inclusion cyst     Patient Active Problem List   Diagnosis Date Noted   Erosive esophagitis    Gastric erosion determined by endoscopy    Duodenal erythema    History of colonic polyps    Erythema of rectum    Chronic pain syndrome 11/26/2020   Bilateral primary osteoarthritis of knee 11/26/2020   Grade I internal hemorrhoids 11/16/2019   Genetic testing 04/20/2019   Family history of breast cancer    History of cervical cancer    Mastalgia 03/17/2019   Family hx-breast malignancy 12/16/2018   Dysphagia    Essential hypertension 01/19/2018   Obesity (BMI 30.0-34.9) 94/17/4081   Eosinophilic esophagitis 44/81/8563   Apophysitis 08/26/2017   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   Pain in joint, multiple sites 07/05/2017   Osteoarthritis of knee 14/97/0263   Lichen sclerosus et atrophicus 04/27/2017   Headache 04/27/2017   Headache disorder 02/17/2017  Post-concussion headache 02/17/2017   Medication monitoring encounter 09/21/2016   Hematuria 04/23/2016   Hx of cervical cancer 01/30/2016   Menopause 01/30/2016   Status post bilateral breast biopsy 01/07/2016   Rectal bleeding 04/30/2015   Chronic pain of both knees 07/23/2014   Depression, major, recurrent, moderate ([place]) 02/02/2014   Adult hypothyroidism 02/02/2014   Obstructive apnea 02/02/2014   Anxiety and depression 02/02/2014   Incomplete bladder emptying 12/12/2012   Tenosynovitis of foot 05/30/2012   Benign neoplasm of kidney 05/13/2012   Urge incontinence 05/13/2012   FOM (frequency of micturition) 05/13/2012   Chronic pain of right hand 05/05/2012   Tarsal tunnel syndrome 03/03/2012    Past Surgical History:   Procedure Laterality Date   ABDOMINAL HYSTERECTOMY     partial hysterectomy   BREAST BIOPSY Bilateral 01/01/2016   Fibroadenoma   COLONOSCOPY WITH PROPOFOL N/A 06/27/2015   Procedure: COLONOSCOPY WITH PROPOFOL;  Surgeon: Josefine Class, MD;  Location: [facility] ENDOSCOPY;  Service: Endoscopy;  Laterality: N/A;   COLONOSCOPY WITH PROPOFOL N/A 09/23/2017   Procedure: COLONOSCOPY WITH PROPOFOL;  Surgeon: Jonathon Bellows, MD;  Location: [facility] ENDOSCOPY;  Service: Gastroenterology;  Laterality: N/A;   COLONOSCOPY WITH PROPOFOL N/A 06/09/2021   Procedure: COLONOSCOPY WITH PROPOFOL;  Surgeon: Lin Landsman, MD;  Location: [facility] ENDOSCOPY;  Service: Gastroenterology;  Laterality: N/A;   ESOPHAGOGASTRODUODENOSCOPY (EGD) WITH PROPOFOL N/A 07/22/2017   Procedure: ESOPHAGOGASTRODUODENOSCOPY (EGD) WITH PROPOFOL;  Surgeon: Jonathon Bellows, MD;  Location: [facility] ENDOSCOPY;  Service: Gastroenterology;  Laterality: N/A;   ESOPHAGOGASTRODUODENOSCOPY (EGD) WITH PROPOFOL N/A 03/31/2018   Procedure: ESOPHAGOGASTRODUODENOSCOPY (EGD) WITH PROPOFOL;  Surgeon: Lin Landsman, MD;  Location: [facility] ENDOSCOPY;  Service: Gastroenterology;  Laterality: N/A;   ESOPHAGOGASTRODUODENOSCOPY (EGD) WITH PROPOFOL N/A 08/25/2021   Procedure: ESOPHAGOGASTRODUODENOSCOPY (EGD) WITH PROPOFOL;  Surgeon: Lin Landsman, MD;  Location: [facility] ENDOSCOPY;  Service: Gastroenterology;  Laterality: N/A;   KNEE ARTHROSCOPY Right 07/22/2016   Procedure: ARTHROSCOPY KNEE debridement microfracture;  Surgeon: Leanor Kail, MD;  Location: [facility] ORS;  Service: Orthopedics;  Laterality: Right;   KNEE ARTHROSCOPY WITH MEDIAL MENISECTOMY Left 11/10/2017   Procedure: KNEE ARTHROSCOPY WITH PARTIAL MEDIAL MENISECTOMY&patella femoral debridement, & ablation;  Surgeon: Leanor Kail, MD;  Location: [facility] ORS;  Service: Orthopedics;  Laterality: Left;   TUBAL LIGATION      Prior to Admission medications   Medication Sig Start Date End Date Taking?  Authorizing Provider  cephALEXin (KEFLEX) 500 MG capsule Take 1 capsule (500 mg total) by mouth 4 (four) times daily for 7 days. 08/31/21 09/07/21 Yes Vallarie Mare M, PA-C  ondansetron (ZOFRAN) 4 MG tablet Take 1 tablet (4 mg total) by mouth every 8 (eight) hours as needed for up to 5 days for nausea or vomiting. 08/31/21 09/05/21 Yes Vallarie Mare M, PA-C  potassium chloride SA (KLOR-CON M) 20 MEQ tablet Take 1 tablet (20 mEq total) by mouth daily for 5 days. 08/31/21 09/05/21 Yes Vallarie Mare M, PA-C  cloNIDine (CATAPRES) 0.2 MG tablet Take 1 tablet (0.2 mg total) by mouth 2 (two) times daily. 07/07/21   Gae Dry, MD  FLUoxetine (PROZAC) 20 MG capsule Take 1 capsule (20 mg total) by mouth daily. 12/19/20   Delsa Grana, PA-C  levothyroxine (SYNTHROID) 75 MCG tablet Take 1 tablet (75 mcg total) by mouth daily before breakfast. 02/17/21   Delsa Grana, PA-C  meloxicam (MOBIC) 15 MG tablet Take 15 mg by mouth daily. 06/30/21   [provider]  omeprazole (PRILOSEC) 40 MG capsule Take 1 capsule (  40 mg total) by mouth 2 (two) times daily before a meal. 07/23/21 08/25/21  Vanga, Tally Due, MD    Allergies Methylprednisolone, Prednisone, and Percocet [oxycodone-acetaminophen]  Family History  Problem Relation Age of Onset   Diabetes Mother    Lung cancer Father 102   Breast cancer Maternal Grandmother 52   Cancer Paternal Grandmother        cancer?   Breast cancer Maternal Aunt        80s   Cirrhosis Cousin    Breast cancer Cousin    Breast cancer Other        x4    Social History Social History   Tobacco Use   Smoking status: Never   Smokeless tobacco: Never  Vaping Use   Vaping Use: Never used  Substance Use Topics   Alcohol use: Not Currently   Drug use: Not Currently     Review of Systems  Constitutional: No fever/chills Eyes:  No discharge ENT: No upper respiratory complaints. Respiratory: no cough. No SOB/ use of accessory muscles to  breath Gastrointestinal: Patient has nausea and vomiting.  Musculoskeletal: Negative for musculoskeletal pain. Skin: Negative for rash, abrasions, lacerations, ecchymosis.    ____________________________________________   PHYSICAL EXAM:  VITAL SIGNS: ED Triage Vitals  Enc Vitals Group     BP 08/31/21 1604 129/85     Pulse Rate 08/31/21 1604 72     Resp 08/31/21 1604 20     Temp 08/31/21 1604 98.4 F (36.9 C)     Temp Source 08/31/21 1604 Oral     SpO2 08/31/21 1604 98 %     Weight 08/31/21 1441 171 lb 15.3 oz (78 kg)     Height 08/31/21 1441 5\' 1"  (1.549 m)     Head Circumference --      Peak Flow --      Pain Score 08/31/21 1441 0     Pain Loc --      Pain Edu? --      Excl. in [place]? --      Constitutional: Alert and oriented. Well appearing and in no acute distress. Eyes: Conjunctivae are normal. PERRL. EOMI. Head: Atraumatic. ENT:      Nose: No congestion/rhinnorhea.      Mouth/Throat: Mucous membranes are moist.  Neck: No stridor.  No cervical spine tenderness to palpation. Cardiovascular: Normal rate, regular rhythm. Normal S1 and S2.  Good peripheral circulation. Respiratory: Normal respiratory effort without tachypnea or retractions. Lungs CTAB. Good air entry to the bases with no decreased or absent breath sounds Gastrointestinal: Bowel sounds x 4 quadrants. Soft and nontender to palpation. No guarding or rigidity. No distention. Musculoskeletal: Full range of motion to all extremities. No obvious deformities noted Neurologic:  Normal for age. No gross focal neurologic deficits are appreciated.  Skin:  Skin is warm, dry and intact. No rash noted. Psychiatric: Mood and affect are normal for age. Speech and behavior are normal.   ____________________________________________   LABS (all labs ordered are listed, but only abnormal results are displayed)  Labs Reviewed  COMPREHENSIVE METABOLIC PANEL - Abnormal; Notable for the following components:      Result  Value   Potassium 3.1 (*)    Calcium 8.8 (*)    ALT 46 (*)    All other components within normal limits  URINALYSIS, ROUTINE W REFLEX MICROSCOPIC - Abnormal; Notable for the following components:   Color, Urine AMBER (*)    APPearance CLOUDY (*)    Hgb urine dipstick  SMALL (*)    Ketones, ur 20 (*)    Protein, ur 30 (*)    Leukocytes,Ua TRACE (*)    Bacteria, UA RARE (*)    All other components within normal limits  RESP PANEL BY RT-PCR (FLU A&B, COVID) ARPGX2  URINE CULTURE  LIPASE, BLOOD  CBC   ____________________________________________  EKG   ____________________________________________  RADIOLOGY Unk Pinto, personally viewed and evaluated these images (plain radiographs) as part of my medical decision making, as well as reviewing the written report by the radiologist.    CT ABDOMEN PELVIS W CONTRAST  Result Date: 08/31/2021 CLINICAL DATA:  Nausea and vomiting. EXAM: CT ABDOMEN AND PELVIS WITH CONTRAST TECHNIQUE: Multidetector CT imaging of the abdomen and pelvis was performed using the standard protocol following bolus administration of intravenous contrast. CONTRAST:  166mL OMNIPAQUE IOHEXOL 300 MG/ML  SOLN COMPARISON:  Feb 08, 2019 FINDINGS: Lower chest: No acute abnormality. Hepatobiliary: There is diffuse fatty infiltration of the liver parenchyma. No focal liver abnormality is seen. Status post cholecystectomy. No biliary dilatation. Pancreas: Unremarkable. No pancreatic ductal dilatation or surrounding inflammatory changes. Spleen: Normal in size without focal abnormality. Adrenals/Urinary Tract: Adrenal glands are unremarkable. Kidneys are normal in size, without renal calculi or hydronephrosis. Multiple bilateral subcentimeter simple renal cysts are seen. Bladder is unremarkable. A stable 2.7 cm x 2.6 cm cystic appearing area is noted within the region of the urethra (axial CT image 79, CT series 2). Stomach/Bowel: Stomach is within normal limits. Appendix  appears normal. No evidence of bowel wall thickening, distention, or inflammatory changes. Noninflamed diverticula are noted within the proximal to mid sigmoid colon. Vascular/Lymphatic: No significant vascular findings are present. No enlarged abdominal or pelvic lymph nodes. Reproductive: Status post hysterectomy.  No adnexal masses. Other: No abdominal wall hernia or abnormality. No abdominopelvic ascites. Musculoskeletal: No acute or significant osseous findings. IMPRESSION: 1. Hepatic steatosis. 2. Evidence of prior cholecystectomy. 3. Sigmoid diverticulosis. 4. 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 Electronically Signed   By: Virgina Norfolk M.D.   On: 08/31/2021 19:33    ____________________________________________    PROCEDURES  Procedure(s) performed:     Procedures     Medications  potassium chloride SA (KLOR-CON M) CR tablet 40 mEq (40 mEq Oral Given 08/31/21 1753)  ondansetron (ZOFRAN-ODT) disintegrating tablet 4 mg (4 mg Oral Given 08/31/21 1753)  sodium chloride 0.9 % bolus 1,000 mL (0 mLs Intravenous Stopped 08/31/21 2003)  iohexol (OMNIPAQUE) 300 MG/ML solution 100 mL (100 mLs Intravenous Contrast Given 08/31/21 1902)  cefTRIAXone (ROCEPHIN) 1 g in sodium chloride 0.9 % 100 mL IVPB (0 g Intravenous Stopped 08/31/21 2020)     ____________________________________________   INITIAL IMPRESSION / ASSESSMENT AND PLAN / ED COURSE  Pertinent labs & imaging results that were available during my care of the patient were reviewed by me and considered in my medical decision making (see chart for details).     Assessment and plan:  Nausea Vomiting 55 year old female presents to the emergency department with suprapubic abdominal discomfort and nausea and vomiting for the past 4 days  Vital signs are reassuring at triage.  On physical exam, patient was alert, active and  nontoxic-appearing.  Her abdomen seems soft and nontender without any guarding.  Differential diagnosis includes UTI, pyelonephritis, nephrolithiasis, acid reflux, gastritis...  Urinalysis did show trace leukocytes, rare bacteria and a small amount of blood and appeared cloudy.  Urine culture is  in process at this time.  CT abdomen pelvis showed stable complex cyst of the urethra which was visualized on CT abdomen pelvis from 2 years ago.  Patient was given oral potassium in the emergency department as well as Zofran and supplemental fluids.  Will cover patient for pyelonephritis with Rocephin and Keflex.  Zofran was prescribed for nausea.  Return precautions were given to return with new or worsening symptoms.  All patient questions were answered.  5-day supply of oral potassium was prescribed at discharge.      ____________________________________________  FINAL CLINICAL IMPRESSION(S) / ED DIAGNOSES  Final diagnoses:  Nausea      NEW MEDICATIONS STARTED DURING THIS VISIT:  ED Discharge Orders          Ordered    potassium chloride SA (KLOR-CON M) 20 MEQ tablet  Daily        08/31/21 1954    cephALEXin (KEFLEX) 500 MG capsule  4 times daily        08/31/21 1955    ondansetron (ZOFRAN) 4 MG tablet  Every 8 hours PRN        08/31/21 1955                This chart was dictated using voice recognition software/Dragon. Despite best efforts to proofread, errors can occur which can change the meaning. Any change was purely unintentional.     Lannie Fields, PA-C 08/31/21 2206    Lucrezia Starch, MD 09/01/21 [phone]

## 2021-08-31 NOTE — ED Provider Notes (Signed)
Emergency Medicine Provider Triage Evaluation Note  Kristina Reed , a 55 y.o. female  was evaluated in triage.  Pt complains of vomiting x2 times a day for the last 3 days.  Review of Systems  Positive: Vomiting x2 each day x3 days Negative: Denies abdominal pain, chest pain, shortness of breath  Physical Exam  Ht 5\' 1"  (1.549 m)    Wt 78 kg    LMP 05/09/1993 (Approximate)    BMI 32.49 kg/m  Gen:   Awake, no distress   Resp:  Normal effort  MSK:   Moves extremities without difficulty  Other:    Medical Decision Making  Medically screening exam initiated at 2:45 PM.  Appropriate orders placed.  Kristina Reed was informed that the remainder of the evaluation will be completed by another provider, this initial triage assessment does not replace that evaluation, and the importance of remaining in the ED until their evaluation is complete.     Versie Starks, PA-C 08/31/21 1445    Delman Kitten, MD 09/01/21 [phone]

## 2021-09-01 ENCOUNTER — Other Ambulatory Visit: Payer: Self-pay

## 2021-09-01 ENCOUNTER — Telehealth: Payer: Self-pay

## 2021-09-01 DIAGNOSIS — E039 Hypothyroidism, unspecified: Secondary | ICD-10-CM

## 2021-09-01 LAB — URINE CULTURE: Culture: NO GROWTH

## 2021-09-01 MED ORDER — LEVOTHYROXINE SODIUM 75 MCG PO TABS: 75.0000 ug | ORAL_TABLET | Freq: Every day | ORAL | 1 refills | Status: DC

## 2021-09-01 NOTE — Telephone Encounter (Signed)
Transition Care Management Unsuccessful Follow-up Telephone Call  Date of discharge and from where:  08/31/2021-[facility]  Attempts:  1st Attempt  Reason for unsuccessful TCM follow-up call:  Left voice message

## 2021-09-02 DIAGNOSIS — M1711 Unilateral primary osteoarthritis, right knee: Secondary | ICD-10-CM | POA: Diagnosis not present

## 2021-09-02 DIAGNOSIS — Z7689 Persons encountering health services in other specified circumstances: Secondary | ICD-10-CM | POA: Diagnosis not present

## 2021-09-02 NOTE — Telephone Encounter (Signed)
Transition Care Management Follow-up Telephone Call Date of discharge and from where: 08/31/2021 from [facility] How have you been since you were released from the hospital? Pt stated that she is doing well and did not have any questions or concerns at this time.  Any questions or concerns? No  Items Reviewed: Did the pt receive and understand the discharge instructions provided? Yes  Medications obtained and verified? Yes  Other? No  Any new allergies since your discharge? No  Dietary orders reviewed? No Do you have support at home? Yes   Functional Questionnaire: (I = Independent and D = Dependent) ADLs: I  Bathing/Dressing- I  Meal Prep- I  Eating- I  Maintaining continence- I  Transferring/Ambulation- I  Managing Meds- I   Follow up appointments reviewed:  PCP Hospital f/u appt confirmed? Yes  Scheduled to see Kathrine Haddock, NP on 09/16/2020 @ 09:40. [place] [facility] f/u appt confirmed? No   Are transportation arrangements needed? No  If their condition worsens, is the pt aware to call PCP or go to the Emergency Dept.? Yes Was the patient provided with contact information for the PCP's office or ED? Yes Was to pt encouraged to call back with questions or concerns? Yes

## 2021-09-03 ENCOUNTER — Ambulatory Visit: Payer: Medicaid Other | Admitting: Family Medicine

## 2021-09-14 DIAGNOSIS — Z419 Encounter for procedure for purposes other than remedying health state, unspecified: Secondary | ICD-10-CM | POA: Diagnosis not present

## 2021-09-16 ENCOUNTER — Encounter: Payer: Self-pay | Admitting: Unknown Physician Specialty

## 2021-09-16 ENCOUNTER — Ambulatory Visit (INDEPENDENT_AMBULATORY_CARE_PROVIDER_SITE_OTHER): Payer: Medicaid Other | Admitting: Unknown Physician Specialty

## 2021-09-16 VITALS — BP 126/80 | HR 86 | Temp 98.1°F | Resp 16 | Ht 61.0 in | Wt 171.0 lb

## 2021-09-16 DIAGNOSIS — M170 Bilateral primary osteoarthritis of knee: Secondary | ICD-10-CM

## 2021-09-16 DIAGNOSIS — N3000 Acute cystitis without hematuria: Secondary | ICD-10-CM

## 2021-09-16 DIAGNOSIS — R112 Nausea with vomiting, unspecified: Secondary | ICD-10-CM

## 2021-09-16 DIAGNOSIS — Z7689 Persons encountering health services in other specified circumstances: Secondary | ICD-10-CM | POA: Diagnosis not present

## 2021-09-16 NOTE — Progress Notes (Signed)
BP 126/80    Pulse 86    Temp 98.1 F (36.7 C)    Resp 16    Ht 5\' 1"  (1.549 m)    Wt 171 lb (77.6 kg)    LMP 05/09/1993 (Approximate)    SpO2 97%    BMI 32.31 kg/m    Subjective:    Patient ID: Kristina Reed, female    DOB: 1966-06-15, 56 y.o.   MRN: [id number]  HPI: Kristina Reed is a 56 y.o. female  Chief Complaint  Patient presents with   Follow-up    ED- 08/31/21   Martin Majestic to the ED on 12/18 for nausea and vomiting for about 4-5 days.  CT scan was unremarkable except for a stable urethral cyst 2 years and following with urology.  Hepatic steatosis with stable liver enzymes.  CBC WNL.  Given supplemental fluids, Keflex and Zofran.  Felt better the next day. Today she feels "OK."   Pt using walker as due to have a knee replacement in 1 month.   Relevant past medical, surgical, family and social history reviewed and updated as indicated. Interim medical history since our last visit reviewed. Allergies and medications reviewed and updated.  Review of Systems  Per HPI unless specifically indicated above     Objective:    BP 126/80    Pulse 86    Temp 98.1 F (36.7 C)    Resp 16    Ht 5\' 1"  (1.549 m)    Wt 171 lb (77.6 kg)    LMP 05/09/1993 (Approximate)    SpO2 97%    BMI 32.31 kg/m   Wt Readings from Last 3 Encounters:  09/16/21 171 lb (77.6 kg)  08/31/21 171 lb 15.3 oz (78 kg)  08/25/21 171 lb (77.6 kg)    Physical Exam Constitutional:      General: She is not in acute distress.    Appearance: Normal appearance. She is well-developed.  HENT:     Head: Normocephalic and atraumatic.  Eyes:     General: Lids are normal. No scleral icterus.       Right eye: No discharge.        Left eye: No discharge.     Conjunctiva/sclera: Conjunctivae normal.  Neck:     Vascular: No carotid bruit or JVD.  Cardiovascular:     Rate and Rhythm: Normal rate and regular rhythm.     Heart sounds: Normal heart sounds.  Pulmonary:     Effort: Pulmonary effort is normal. No respiratory  distress.     Breath sounds: Normal breath sounds.  Abdominal:     General: Abdomen is flat. There is no distension.     Palpations: There is no hepatomegaly or splenomegaly.     Tenderness: There is no abdominal tenderness. There is no guarding or rebound.  Musculoskeletal:        General: Normal range of motion.     Cervical back: Normal range of motion and neck supple.  Skin:    General: Skin is warm and dry.     Coloration: Skin is not pale.     Findings: No rash.  Neurological:     Mental Status: She is alert and oriented to person, place, and time.  Psychiatric:        Behavior: Behavior normal.        Thought Content: Thought content normal.        Judgment: Judgment normal.    Results for orders placed or performed  during the hospital encounter of 08/31/21  Urine Culture   Specimen: Urine, Clean Catch  Result Value Ref Range   Specimen Description      URINE, CLEAN CATCH Performed at [facility], [address] [place], [place] [address]    Special Requests      NONE Performed at [facility], [address] [place], [place] [address]    Culture      NO GROWTH Performed at [facility], [place] [address] [place], [place] [address]    Report Status 09/01/2021 FINAL   Resp Panel by RT-PCR (Flu A&B, Covid) Nasopharyngeal Swab   Specimen: Nasopharyngeal Swab; Nasopharyngeal(NP) swabs in vial transport medium  Result Value Ref Range   SARS Coronavirus 2 by RT PCR NEGATIVE NEGATIVE   Influenza A by PCR NEGATIVE NEGATIVE   Influenza B by PCR NEGATIVE NEGATIVE  Lipase, blood  Result Value Ref Range   Lipase 29 11 - 51 U/L  Comprehensive metabolic panel  Result Value Ref Range   Sodium 137 135 - 145 mmol/L   Potassium 3.1 (L) 3.5 - 5.1 mmol/L   Chloride 101 98 - 111 mmol/L   CO2 28 22 - 32 mmol/L   Glucose, Bld 96 70 - 99 mg/dL   BUN 10 6 - 20 mg/dL   Creatinine, Ser 0.85 0.44 - 1.00 mg/dL   Calcium 8.8 (L) 8.9 - 10.3 mg/dL   Total  Protein 7.9 6.5 - 8.1 g/dL   Albumin 4.0 3.5 - 5.0 g/dL   AST 34 15 - 41 U/L   ALT 46 (H) 0 - 44 U/L   Alkaline Phosphatase 70 38 - 126 U/L   Total Bilirubin 0.6 0.3 - 1.2 mg/dL   GFR, Estimated >60 >60 mL/min   Anion gap 8 5 - 15  CBC  Result Value Ref Range   WBC 6.2 4.0 - 10.5 K/uL   RBC 4.55 3.87 - 5.11 MIL/uL   Hemoglobin 13.2 12.0 - 15.0 g/dL   HCT 39.5 36.0 - 46.0 %   MCV 86.8 80.0 - 100.0 fL   MCH 29.0 26.0 - 34.0 pg   MCHC 33.4 30.0 - 36.0 g/dL   RDW 12.7 11.5 - 15.5 %   Platelets 276 150 - 400 K/uL   nRBC 0.0 0.0 - 0.2 %  Urinalysis, Routine w reflex microscopic Urine, Clean Catch  Result Value Ref Range   Color, Urine AMBER (A) YELLOW   APPearance CLOUDY (A) CLEAR   Specific Gravity, Urine 1.030 1.005 - 1.030   pH 5.0 5.0 - 8.0   Glucose, UA NEGATIVE NEGATIVE mg/dL   Hgb urine dipstick SMALL (A) NEGATIVE   Bilirubin Urine NEGATIVE NEGATIVE   Ketones, ur 20 (A) NEGATIVE mg/dL   Protein, ur 30 (A) NEGATIVE mg/dL   Nitrite NEGATIVE NEGATIVE   Leukocytes,Ua TRACE (A) NEGATIVE   RBC / HPF 0-5 0 - 5 RBC/hpf   WBC, UA 11-20 0 - 5 WBC/hpf   Bacteria, UA RARE (A) NONE SEEN   Squamous Epithelial / LPF 21-50 0 - 5   Mucus PRESENT       Assessment & Plan:   Problem List Items Addressed This Visit       Unprioritized   Osteoarthritis of knee    Due for a knee replacement next month.  No pre-op testing or physical ordered at this time      Other Visit Diagnoses     Nausea and vomiting, unspecified vomiting type    -  Primary   Resolved.  Labs and CT unremarkable.  Urine was positive in the ER and will recheck today.     Relevant Orders   Urinalysis   Acute cystitis without hematuria       Recheck urine today with culture and sensitivity.  Pending visit with Urology for cyst also noted on CT.  Has incomplete emptying and probable cause for ER visi   Relevant Orders   Urinalysis   CULTURE, URINE COMPREHENSIVE        Follow up plan: F/u with pre-op  physical if requested

## 2021-09-16 NOTE — Assessment & Plan Note (Signed)
Due for a knee replacement next month.  No pre-op testing or physical ordered at this time

## 2021-09-18 LAB — CULTURE, URINE COMPREHENSIVE
MICRO NUMBER:: 12822779
SPECIMEN QUALITY:: ADEQUATE

## 2021-09-18 LAB — URINALYSIS
Bilirubin Urine: NEGATIVE
Glucose, UA: NEGATIVE
Hgb urine dipstick: NEGATIVE
Ketones, ur: NEGATIVE
Nitrite: NEGATIVE
Specific Gravity, Urine: 1.018 (ref 1.001–1.035)
pH: 5.5 (ref 5.0–8.0)

## 2021-09-22 ENCOUNTER — Other Ambulatory Visit: Payer: Self-pay

## 2021-09-23 ENCOUNTER — Encounter: Payer: Self-pay | Admitting: Gastroenterology

## 2021-09-23 ENCOUNTER — Other Ambulatory Visit: Payer: Self-pay

## 2021-09-23 ENCOUNTER — Ambulatory Visit (INDEPENDENT_AMBULATORY_CARE_PROVIDER_SITE_OTHER): Payer: Medicaid Other | Admitting: Gastroenterology

## 2021-09-23 VITALS — BP 133/91 | HR 94 | Temp 98.3°F | Ht 61.0 in | Wt 172.1 lb

## 2021-09-23 DIAGNOSIS — K2210 Ulcer of esophagus without bleeding: Secondary | ICD-10-CM | POA: Diagnosis not present

## 2021-09-23 DIAGNOSIS — Z7689 Persons encountering health services in other specified circumstances: Secondary | ICD-10-CM | POA: Diagnosis not present

## 2021-09-23 DIAGNOSIS — K2100 Gastro-esophageal reflux disease with esophagitis, without bleeding: Secondary | ICD-10-CM | POA: Diagnosis not present

## 2021-09-23 NOTE — Progress Notes (Signed)
Cephas Darby, MD [address]  [place]  [place], [place] [address]  Main: [phone]  Fax: [phone] Pager: [phone]   Primary Care Physician: Delsa Grana, PA-C  Primary Gastroenterologist:  Dr. Vicente Males  Chief Complaint  Patient presents with   discuss egd    HPI: Kristina Reed is a 56 y.o. female who regularly sees Dr. Vicente Males for eosinophilic esophagitis is referred to me for management of hemorrhoids. Patient reports that she underwent hemorrhoid banding by Dr [name] 3 years ago underwent 3 banding sessions and she did not think the procedure was helpful in alleviating her hemorrhoid symptoms. Her symptoms are predominantly itching, burning, swelling, pressure, rectal bleeding. She has history of chronic constipation and spends about 10-20 minutes time on toilet, due to significant straining/pushing. She consumes red meat, fried foods regularly. She has not tried any stool softener or fiber supplements.  Follow-up visit 01/19/2018 She tried linaclotide 163mcg which resulted in severe diarrhea and mild rectal bleeding which resolved. She no longer experiences diarrhea. Reports having one to 2 soft bowel movements daily, but associated with pushing. She denies any symptoms from hemorrhoids at this time. She is taking fiber supplements as recommended daily.  Follow-up visit 02/11/2018 She reports that her stools are very soft and resulted in incontinence on 2 separate occasions in last 2 days. She stopped all the fried foods. She is taking fiber supplements daily. She denies abdominal pain, nausea, vomiting, abdominal cramps, blood in stools. She denies taking any recent antibiotics, sick contacts or eating out. She is here for hemorrhoid banding today  Follow-up visit 03/25/2018 She reports recurrence of dysphagia to solids. She was treated with Flovent for 8 weeks in the past for his peptic esophagitis. She is currently off PPI as well. She is concerned about  intermittent episodes of painless rectal bleeding both on wiping and dripping. She denies constipation. She reports some rectal discomfort every time she has a bowel movement. She is here to undergo hemorrhoid ligation.  Follow-up visit 03/07/2019 She made a follow-up to see me due to episode of rectal bleeding that occurred yesterday.  She reports it was bright red blood, 1-2 teaspoonful.  She reports mild rectal discomfort.  She denies constipation. She underwent hemorrhoid ligation x2 in the past Patient underwent EGD for follow-up of eosinophilic esophagitis which revealed significant improvement in inflammation and eosinophilic count.  She was on Dexilant.  Currently, not on PPI.  She denies dysphagia  Follow-up visit 04/25/2019 Patient reports severe constipation.  She could not afford $3 co-pay for Linzess.  She tries prune juice.  She does report rectal bleeding on wiping  Follow-up visit 10/24/2019 Patient is here for follow-up of severe constipation as well as intermittent rectal bleeding.  Patient underwent ligation of internal hemorrhoids x4 so far.  She continues to have intermittent bright red blood per rectum on wiping and in the toilet bowl.  She recently went to ER and her hemoglobin is normal.  She does report ongoing constipation.  She cannot afford any prescription medications for constipation that I gave her so far.  She describes her stools as 1-2 on Bristol stool scale associated with significant straining.  She does consume white bread regularly  Follow-up visit 3-21 Patient is here to discuss about ongoing rectal bleeding-she is interested to be referred to general surgery.  She reports taking Linzess regularly, however feels constipated  Follow-up visit 02/06/2020 Patient reports ongoing constipation as she is unable to take Linzess  regularly due to her financial constraints.  She was evaluated by general surgery for hemorrhoidectomy and this was not recommended.  Follow-up  visit 09/23/2021 Patient called our office in November secondary to recurrence of dysphagia and heartburn.  She underwent upper endoscopy on 08/25/2021 which revealed mild erosive esophagitis and biopsies confirmed reflux esophagitis with eosinophils 15 per HPF in distal esophagus only.  I started her on omeprazole 40 mg p.o. twice daily which significantly improved her symptoms.  She reports that she is no longer experiencing food stuck in her chest.  She is not experiencing heartburn every day.  She is scheduled to undergo right knee replacement on 10/22/2021.  Her weight has been stable.     Current Outpatient Medications:    cloNIDine (CATAPRES) 0.2 MG tablet, Take 1 tablet (0.2 mg total) by mouth 2 (two) times daily., Disp: 90 tablet, Rfl: 3   diclofenac (VOLTAREN) 75 MG EC tablet, Take 75 mg by mouth 2 (two) times daily., Disp: , Rfl:    estradiol (ESTRACE) 1 MG tablet, Take 1 mg by mouth daily., Disp: , Rfl:    FLUoxetine (PROZAC) 20 MG capsule, Take 1 capsule (20 mg total) by mouth daily., Disp: 90 capsule, Rfl: 3   levothyroxine (SYNTHROID) 75 MCG tablet, Take 1 tablet (75 mcg total) by mouth daily before breakfast., Disp: 90 tablet, Rfl: 1   meloxicam (MOBIC) 15 MG tablet, Take 15 mg by mouth daily., Disp: , Rfl:    omeprazole (PRILOSEC) 40 MG capsule, Take 1 capsule (40 mg total) by mouth 2 (two) times daily before a meal., Disp: 90 capsule, Rfl: 2   potassium chloride SA (KLOR-CON M) 20 MEQ tablet, Take 1 tablet (20 mEq total) by mouth daily for 5 days., Disp: 5 tablet, Rfl: 0   Allergies as of 09/23/2021 - Review Complete 09/23/2021  Allergen Reaction Noted   Methylprednisolone  05/12/2021   Prednisone Nausea And Vomiting 07/23/2021   Percocet [oxycodone-acetaminophen] Palpitations 01/20/2015    NSAIDs: none  Antiplts/Anticoagulants/Anti thrombotics: none  GI procedures:  Upper endoscopy 08/25/2021 - Erythematous duodenopathy. Biopsied. - Erosive gastropathy with stigmata of  recent bleeding. Biopsied. - Normal gastric body and incisura. Biopsied. - LA Grade A reflux esophagitis with bleeding. - Esophagogastric landmarks identified. - Normal upper third of esophagus and middle third of esophagus. Biopsied. DIAGNOSIS:  A. DUODENUM; COLD BIOPSY:  - DUODENAL MUCOSA WITH FOCAL FEATURES SUGGESTIVE OF PEPTIC DUODENITIS.  - NEGATIVE FOR FEATURES OF CELIAC DISEASE.  - NEGATIVE FOR DYSPLASIA AND MALIGNANCY.   B. STOMACH; COLD BIOPSY:  - CHRONIC GASTRITIS.  - NEGATIVE FOR ACTIVE INFLAMMATION AND H PYLORI.  - NEGATIVE FOR INTESTINAL METAPLASIA, DYSPLASIA, AND MALIGNANCY.   C. ESOPHAGUS, DISTAL; COLD BIOPSY:  - SQUAMOUS MUCOSA WITH FOCALLY INCREASED EOSINOPHILS (APPROXIMATELY 15  IN A SINGLE HIGH POWER FIELD).  - SEE COMMENT.   D. ESOPHAGUS, PROXIMAL; COLD BIOPSY:  - SQUAMOUS MUCOSA WITH MILD FEATURES SUGGESTIVE OF REFLUX.  - NEGATIVE FOR INCREASED EOSINOPHILS.  - NEGATIVE FOR INTESTINAL METAPLASIA, DYSPLASIA, AND MALIGNANCY.  - SEE COMMENT.   Comment:  The findings are most consistent with reflux esophagitis; however  eosinophilic esophagitis cannot be entirely excluded.  Clinical  correlation is recommended.   Upper endoscopy 07/22/2017 - Normal examined duodenum. - Gastritis. Biopsied. - Salmon-colored mucosa suspicious for Barrett's esophagus. Biopsied. - Normal mucosa was found in the entire esophagus. Biopsied. DIAGNOSIS:  A. STOMACH; COLD BIOPSY:  - ANTRAL MUCOSA WITH CHANGES CONSISTENT WITH HEALING MUCOSAL INJURY.  - NEGATIVE FOR H. PYLORI,  DYSPLASIA, AND MALIGNANCY.   B. ESOPHAGUS, DISTAL; COLD BIOPSY:  - GASTROESOPHAGITIS.  - UP TO 40 INTRAEPITHELIAL EOSINOPHILS IN A HIGH-POWER FIELD WITH  EOSINOPHILIC MICROABSCESS FORMATION.  - PANCREATIC ACINAR CELL METAPLASIA.  - NEGATIVE FOR INTESTINAL METAPLASIA, DYSPLASIA, AND MALIGNANCY.   C. ESOPHAGUS; RANDOM BIOPSY:  - SQUAMOUS MUCOSA WITH AN INCREASE IN INTRAEPITHELIAL EOSINOPHILS  INVOLVING ALL  BIOPSY FRAGMENTS, SEE COMMENT.  - UP TO 55 EOSINOPHILS IN A HIGH-POWER FIELD WITH EOSINOPHILIC  MICROABSCESS FORMATION.  - NEGATIVE FOR DYSPLASIA AND MALIGNANCY.   Colonoscopy on 09/2017 by Dr. Vicente Males - Non-bleeding internal hemorrhoids. - Diverticulosis in the sigmoid colon. - The examination was otherwise normal on direct and retroflexion views. - No specimens collected.  EGD 03/31/2018 - Normal duodenal bulb and second portion of the duodenum. - Normal stomach. - Esophagogastric landmarks identified. - Normal esophagus. Biopsied.  DIAGNOSIS:  A.  DISTAL ESOPHAGUS; COLD BIOPSY:  - SQUAMOUS MUCOSA WITH UP TO 10 IN A HIGH-POWER FIELD INTRAEPITHELIAL  EOSINOPHILS.  - NEGATIVE FOR DYSPLASIA AND MALIGNANCY.   B.  PROXIMAL ESOPHAGUS; COLD BIOPSY:  - SQUAMOUS MUCOSA NEGATIVE FOR EOSINOPHILS, DYSPLASIA AND MALIGNANCY.   ROS:  General: Negative for anorexia, weight loss, fever, chills, fatigue, weakness. ENT: Negative for hoarseness, difficulty swallowing , nasal congestion. CV: Negative for chest pain, angina, palpitations, dyspnea on exertion, peripheral edema.  Respiratory: Negative for dyspnea at rest, dyspnea on exertion, cough, sputum, wheezing.  GI: See history of present illness. GU:  Negative for dysuria, hematuria, urinary incontinence, urinary frequency, nocturnal urination.  Endo: Negative for unusual weight change.    Physical Examination:   BP (!) 133/91 (BP Location: Left Arm, Patient Position: Sitting, Cuff Size: Normal)    Pulse 94    Temp 98.3 F (36.8 C) (Oral)    Ht 5\' 1"  (1.549 m)    Wt 172 lb 2 oz (78.1 kg)    LMP 05/09/1993 (Approximate)    BMI 32.52 kg/m   General: Well-nourished, well-developed in no acute distress.  Eyes: No icterus. Conjunctivae pink. Mouth: Oropharyngeal mucosa moist and pink , no lesions erythema or exudate. Lungs: Clear to auscultation bilaterally. Non-labored. Heart: Regular rate and rhythm, no murmurs rubs or gallops.  Abdomen:  Bowel sounds are normal, nontender, nondistended, no hepatosplenomegaly or masses, no hernia , no rebound or guarding.   Rectal exam: Nontender, normal perianal exam, hypopigmented perianal skin Extremities: No lower extremity edema. No clubbing or deformities. Neuro: Alert and oriented x 3.  Grossly intact. Skin: Warm and dry, no jaundice.   Psych: Alert and cooperative, normal mood and affect.   Imaging Studies: CT ABDOMEN PELVIS W CONTRAST  Result Date: 08/31/2021 CLINICAL DATA:  Nausea and vomiting. EXAM: CT ABDOMEN AND PELVIS WITH CONTRAST TECHNIQUE: Multidetector CT imaging of the abdomen and pelvis was performed using the standard protocol following bolus administration of intravenous contrast. CONTRAST:  139mL OMNIPAQUE IOHEXOL 300 MG/ML  SOLN COMPARISON:  Feb 08, 2019 FINDINGS: Lower chest: No acute abnormality. Hepatobiliary: There is diffuse fatty infiltration of the liver parenchyma. No focal liver abnormality is seen. Status post cholecystectomy. No biliary dilatation. Pancreas: Unremarkable. No pancreatic ductal dilatation or surrounding inflammatory changes. Spleen: Normal in size without focal abnormality. Adrenals/Urinary Tract: Adrenal glands are unremarkable. Kidneys are normal in size, without renal calculi or hydronephrosis. Multiple bilateral subcentimeter simple renal cysts are seen. Bladder is unremarkable. A stable 2.7 cm x 2.6 cm cystic appearing area is noted within the region of the urethra (axial CT image 79, CT series 2).  Stomach/Bowel: Stomach is within normal limits. Appendix appears normal. No evidence of bowel wall thickening, distention, or inflammatory changes. Noninflamed diverticula are noted within the proximal to mid sigmoid colon. Vascular/Lymphatic: No significant vascular findings are present. No enlarged abdominal or pelvic lymph nodes. Reproductive: Status post hysterectomy.  No adnexal masses. Other: No abdominal wall hernia or abnormality. No abdominopelvic  ascites. Musculoskeletal: No acute or significant osseous findings. IMPRESSION: 1. Hepatic steatosis. 2. Evidence of prior cholecystectomy. 3. Sigmoid diverticulosis. 4. 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 Electronically Signed   By: Virgina Norfolk M.D.   On: 08/31/2021 19:33    Assessment and Plan:   Kristina Reed is a 56 y.o. female history of eosinophilic esophagitis treated with Flovent in the past, chronic constipation and intermittent rectal bleeding here for follow-up. Prior history of banding about 3 years ago with modest benefit. She now has recurrence of dysphagia  Dysphagia to solids: with prior history of eosinophilic Esophagitis EGD in 08/2021, off PPI revealed mild erosive esophagitis and 15 eosinophils on high-power field in distal esophagus only.  There was no eosinophils in the proximal esophagus.  This goes against a diagnosis of eosinophilic esophagitis.  Since patient is responding to optimal dose PPI, I recommend to continue the same.  She will take Prilosec 40 mg twice daily before meals for 3 months which is until early March and then switch to once a day Continue antireflux lifestyle I have also discussed with patient regarding antireflux surgery if unable to wean off PPI and patient is reluctant about this surgery at this time   Follow up in 4 months   Dr Sherri Sear, MD

## 2021-10-07 ENCOUNTER — Ambulatory Visit: Payer: Medicaid Other | Admitting: Unknown Physician Specialty

## 2021-10-07 DIAGNOSIS — Z7689 Persons encountering health services in other specified circumstances: Secondary | ICD-10-CM | POA: Diagnosis not present

## 2021-10-10 ENCOUNTER — Other Ambulatory Visit: Payer: Self-pay

## 2021-10-10 ENCOUNTER — Other Ambulatory Visit: Payer: Self-pay | Admitting: Orthopedic Surgery

## 2021-10-10 ENCOUNTER — Other Ambulatory Visit
Admission: RE | Admit: 2021-10-10 | Discharge: 2021-10-10 | Disposition: A | Discharge status: Home / Self Care | Payer: Medicaid Other | Source: Ambulatory Visit | Attending: Orthopedic Surgery | Admitting: Orthopedic Surgery

## 2021-10-10 ENCOUNTER — Encounter: Payer: Self-pay | Admitting: Orthopedic Surgery

## 2021-10-10 DIAGNOSIS — Z01818 Encounter for other preprocedural examination: Secondary | ICD-10-CM

## 2021-10-10 DIAGNOSIS — Z01812 Encounter for preprocedural laboratory examination: Secondary | ICD-10-CM

## 2021-10-10 NOTE — H&P (Signed)
Kristina Reed MRN:  [id number] DOB/SEX:  15-Jan-1966/female  CHIEF COMPLAINT:  Painful right Knee  HISTORY: Patient is a 56 y.o. female presented with a history of pain in the right knee. Onset of symptoms was gradual starting several years ago with gradually worsening course since that time. Prior procedures on the knee include none. Patient has been treated conservatively with over-the-counter NSAIDs and activity modification. Patient currently rates pain in the knee at 10 out of 10 with activity. There is pain at night.  PAST MEDICAL HISTORY: Patient Active Problem List   Diagnosis Date Noted   Erosive esophagitis    Gastric erosion determined by endoscopy    Duodenal erythema    History of colonic polyps    Erythema of rectum    Chronic pain syndrome 11/26/2020   Bilateral primary osteoarthritis of knee 11/26/2020   Grade I internal hemorrhoids 11/16/2019   Genetic testing 04/20/2019   Family history of breast cancer    History of cervical cancer    Mastalgia 03/17/2019   Family hx-breast malignancy 12/16/2018   Dysphagia    Essential hypertension 01/19/2018   Obesity (BMI 30.0-34.9) 69/48/5462   Eosinophilic esophagitis 70/35/0093   Apophysitis 08/26/2017   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   Pain in joint, multiple sites 07/05/2017   Osteoarthritis of knee 81/82/9937   Lichen sclerosus et atrophicus 04/27/2017   Headache 04/27/2017   Headache disorder 02/17/2017   Post-concussion headache 02/17/2017   Medication monitoring encounter 09/21/2016   Hematuria 04/23/2016   Hx of cervical cancer 01/30/2016   Menopause 01/30/2016   Status post bilateral breast biopsy 01/07/2016   Rectal bleeding 04/30/2015   Chronic pain of both knees 07/23/2014   Depression, major, recurrent, moderate ([place]) 02/02/2014   Adult hypothyroidism 02/02/2014   Obstructive apnea 02/02/2014   Anxiety and depression 02/02/2014   Incomplete bladder emptying 12/12/2012   Tenosynovitis of foot  05/30/2012   Benign neoplasm of kidney 05/13/2012   Urge incontinence 05/13/2012   FOM (frequency of micturition) 05/13/2012   Chronic pain of right hand 05/05/2012   Tarsal tunnel syndrome 03/03/2012   Past Medical History:  Diagnosis Date   Anxiety    Arthritis    right knee and right elbow   Cancer ([place])    Cervical CA with partial hysterectomy.   Cognitive impairment, mild, so stated    Depression    Diverticulosis    Eosinophilic esophagitis 16/96/7893   Biopsy Dec 2018   Esophageal dysphagia    Family history of breast cancer 05/2021   cancer genetic testing letter sent   Gallstones    GERD (gastroesophageal reflux disease)    History of cervical cancer    Hx of cervical cancer 01/30/2016   Hypertension    Hypothyroidism    Sleep apnea    does not use a C-PAP   Status post partial hysterectomy    Due to Cervical CA   Vaginal inclusion cyst    Past Surgical History:  Procedure Laterality Date   ABDOMINAL HYSTERECTOMY     partial hysterectomy   BREAST BIOPSY Bilateral 01/01/2016   Fibroadenoma   COLONOSCOPY WITH PROPOFOL N/A 06/27/2015   Procedure: COLONOSCOPY WITH PROPOFOL;  Surgeon: Josefine Class, MD;  Location: [facility] ENDOSCOPY;  Service: Endoscopy;  Laterality: N/A;   COLONOSCOPY WITH PROPOFOL N/A 09/23/2017   Procedure: COLONOSCOPY WITH PROPOFOL;  Surgeon: Jonathon Bellows, MD;  Location: [facility] ENDOSCOPY;  Service: Gastroenterology;  Laterality: N/A;   COLONOSCOPY WITH PROPOFOL N/A 06/09/2021   Procedure: COLONOSCOPY WITH  PROPOFOL;  Surgeon: Lin Landsman, MD;  Location: [facility] ENDOSCOPY;  Service: Gastroenterology;  Laterality: N/A;   ESOPHAGOGASTRODUODENOSCOPY (EGD) WITH PROPOFOL N/A 07/22/2017   Procedure: ESOPHAGOGASTRODUODENOSCOPY (EGD) WITH PROPOFOL;  Surgeon: Jonathon Bellows, MD;  Location: [facility] ENDOSCOPY;  Service: Gastroenterology;  Laterality: N/A;   ESOPHAGOGASTRODUODENOSCOPY (EGD) WITH PROPOFOL N/A 03/31/2018   Procedure: ESOPHAGOGASTRODUODENOSCOPY  (EGD) WITH PROPOFOL;  Surgeon: Lin Landsman, MD;  Location: [facility] ENDOSCOPY;  Service: Gastroenterology;  Laterality: N/A;   ESOPHAGOGASTRODUODENOSCOPY (EGD) WITH PROPOFOL N/A 08/25/2021   Procedure: ESOPHAGOGASTRODUODENOSCOPY (EGD) WITH PROPOFOL;  Surgeon: Lin Landsman, MD;  Location: [facility] ENDOSCOPY;  Service: Gastroenterology;  Laterality: N/A;   KNEE ARTHROSCOPY Right 07/22/2016   Procedure: ARTHROSCOPY KNEE debridement microfracture;  Surgeon: Leanor Kail, MD;  Location: [facility] ORS;  Service: Orthopedics;  Laterality: Right;   KNEE ARTHROSCOPY WITH MEDIAL MENISECTOMY Left 11/10/2017   Procedure: KNEE ARTHROSCOPY WITH PARTIAL MEDIAL MENISECTOMY&patella femoral debridement, & ablation;  Surgeon: Leanor Kail, MD;  Location: [facility] ORS;  Service: Orthopedics;  Laterality: Left;   TUBAL LIGATION       MEDICATIONS:  (Not in a hospital admission)   ALLERGIES:   Allergies  Allergen Reactions   Prednisone Nausea And Vomiting   Methylprednisolone Palpitations    Increased heart rate   Percocet [Oxycodone-Acetaminophen] Palpitations    REVIEW OF SYSTEMS:  Pertinent items are noted in HPI.   FAMILY HISTORY:   Family History  Problem Relation Age of Onset   Diabetes Mother    Lung cancer Father 20   Breast cancer Maternal Grandmother 7   Cancer Paternal Grandmother        cancer?   Breast cancer Maternal Aunt        80s   Cirrhosis Cousin    Breast cancer Cousin    Breast cancer Other        x4    SOCIAL HISTORY:   Social History   Tobacco Use   Smoking status: Never   Smokeless tobacco: Never  Substance Use Topics   Alcohol use: Not Currently     EXAMINATION:  Vital signs in last 24 hours: @VSRANGES @  General appearance: alert, cooperative, and no distress Neck: no JVD and supple, symmetrical, trachea midline Lungs: clear to auscultation bilaterally Heart: regular rate and rhythm, S1, S2 normal, no murmur, click, rub or gallop Abdomen: soft,  non-tender; bowel sounds normal; no masses,  no organomegaly Extremities: extremities normal, atraumatic, no cyanosis or edema and Homans sign is negative, no sign of DVT Pulses: 2+ and symmetric Skin: Skin color, texture, turgor normal. No rashes or lesions Neurologic: Alert and oriented X 3, normal strength and tone. Normal symmetric reflexes. Normal coordination and gait  Musculoskeletal:  ROM 0-90, Ligaments intact,  Imaging Review Plain radiographs demonstrate severe degenerative joint disease of the right knee. The overall alignment is significant varus. The bone quality appears to be good for age and reported activity level.  Assessment/Plan: Primary osteoarthritis, right knee   The patient history, physical examination and imaging studies are consistent with advanced degenerative joint disease of the right knee. The patient has failed conservative treatment.  The clearance notes were reviewed.  After discussion with the patient it was felt that Total Knee Replacement was indicated. The procedure,  risks, and benefits of total knee arthroplasty were presented and reviewed. The risks including but not limited to aseptic loosening, infection, blood clots, vascular injury, stiffness, patella tracking problems complications among others were discussed. The patient acknowledged the explanation, agreed to proceed with the  plan.  Carlynn Spry 10/10/2021, 10:28 AM

## 2021-10-10 NOTE — Patient Instructions (Addendum)
Your procedure is scheduled on: 10/22/21 - Wednesday Report to the Registration Desk on the 1st floor of the [facility]. To find out your arrival time, please call [phone] between 1PM - 3PM on:10/21/21 - Tuesday Report to [facility] on 10/17/21 for Covid Testing/Labs/EKG at 9:30am.  REMEMBER: Instructions that are not followed completely may result in serious medical risk, up to and including death; or upon the discretion of your surgeon and anesthesiologist your surgery may need to be rescheduled.  Do not eat food after midnight the night before surgery.  No gum chewing, lozengers or hard candies.  You may however, drink CLEAR liquids up to 2 hours before you are scheduled to arrive for your surgery. Do not drink anything within 2 hours of your scheduled arrival time.  Clear liquids include: - water  - apple juice without pulp - gatorade (not RED, PURPLE, OR BLUE) - black coffee or tea (Do NOT add milk or creamers to the coffee or tea) Do NOT drink anything that is not on this list.  TAKE THESE MEDICATIONS THE MORNING OF SURGERY WITH A SIP OF WATER:  - cloNIDine (CATAPRES) 0.2 MG tablet - estradiol (ESTRACE) 1 MG tablet - FLUoxetine (PROZAC) 20 MG capsule - levothyroxine (SYNTHROID) 75 MCG tablet - omeprazole (PRILOSEC) 40 MG capsule, (take one the night before and one on the morning of surgery - helps to prevent nausea after surgery.) - traMADol (ULTRAM) 50 MG tablet- if needed  One week prior to surgery: Stop taking - meloxicam (MOBIC) 15 MG tablet Stop Anti-inflammatories (NSAIDS) such as Advil, Aleve, Ibuprofen, Motrin, Naproxen, Naprosyn and Aspirin based products such as Excedrin, Goodys Powder, BC Powder.  Stop ANY OVER THE COUNTER supplements until after surgery.  You may however, continue to take Tylenol if needed for pain up until the day of surgery.  No Alcohol for 24 hours before or after surgery.  No Smoking including e-cigarettes for 24 hours  prior to surgery.  No chewable tobacco products for at least 6 hours prior to surgery.  No nicotine patches on the day of surgery.  Do not use any "recreational" drugs for at least a week prior to your surgery.  Please be advised that the combination of cocaine and anesthesia may have negative outcomes, up to and including death. If you test positive for cocaine, your surgery will be cancelled.  On the morning of surgery brush your teeth with toothpaste and water, you may rinse your mouth with mouthwash if you wish. Do not swallow any toothpaste or mouthwash.  Use CHG Soap or wipes as directed on instruction sheet.  Do not wear jewelry, make-up, hairpins, clips or nail polish.  Do not wear lotions, powders, or perfumes.   Do not shave body from the neck down 48 hours prior to surgery just in case you cut yourself which could leave a site for infection.  Also, freshly shaved skin may become irritated if using the CHG soap.  Contact lenses, hearing aids and dentures may not be worn into surgery.  Do not bring valuables to the hospital. [facility] is not responsible for any missing/lost belongings or valuables.   Notify your doctor if there is any change in your medical condition (cold, fever, infection).  Wear comfortable clothing (specific to your surgery type) to the hospital.  After surgery, you can help prevent lung complications by doing breathing exercises.  Take deep breaths and cough every 1-2 hours. Your doctor may order a device called an Incentive  Spirometer to help you take deep breaths. When coughing or sneezing, hold a pillow firmly against your incision with both hands. This is called splinting. Doing this helps protect your incision. It also decreases belly discomfort.  If you are being admitted to the hospital overnight, leave your suitcase in the car. After surgery it may be brought to your room.  If you are being discharged the day of surgery, you will not be  allowed to drive home. You will need a responsible adult (18 years or older) to drive you home and stay with you that night.   If you are taking public transportation, you will need to have a responsible adult (18 years or older) with you. Please confirm with your physician that it is acceptable to use public transportation.   Please call the [facility] at [phone] if you have any questions about these instructions.  Surgery Visitation Policy:  Patients undergoing a surgery or procedure may have one family member or support person with them as long as that person is not COVID-19 positive or experiencing its symptoms.  That person may remain in the waiting area during the procedure and may rotate out with other people.  Inpatient Visitation:    Visiting hours are 7 a.m. to 8 p.m. Up to two visitors ages 16+ are allowed at one time in a patient room. The visitors may rotate out with other people during the day. Visitors must check out when they leave, or other visitors will not be allowed. One designated support person may remain overnight. The visitor must pass COVID-19 screenings, use hand sanitizer when entering and exiting the patients room and wear a mask at all times, including in the patients room. Patients must also wear a mask when staff or their visitor are in the room. Masking is required regardless of vaccination status.

## 2021-10-14 DIAGNOSIS — Z7689 Persons encountering health services in other specified circumstances: Secondary | ICD-10-CM | POA: Diagnosis not present

## 2021-10-15 ENCOUNTER — Encounter: Payer: Self-pay | Admitting: Nurse Practitioner

## 2021-10-15 ENCOUNTER — Ambulatory Visit: Payer: Medicaid Other | Admitting: Nurse Practitioner

## 2021-10-15 ENCOUNTER — Telehealth (INDEPENDENT_AMBULATORY_CARE_PROVIDER_SITE_OTHER): Payer: Medicaid Other | Admitting: Nurse Practitioner

## 2021-10-15 ENCOUNTER — Other Ambulatory Visit: Payer: Self-pay

## 2021-10-15 DIAGNOSIS — Z419 Encounter for procedure for purposes other than remedying health state, unspecified: Secondary | ICD-10-CM | POA: Diagnosis not present

## 2021-10-16 NOTE — Progress Notes (Signed)
Virtual appointment cancelled, needs in person appointment

## 2021-10-17 ENCOUNTER — Encounter: Payer: Self-pay | Admitting: Urgent Care

## 2021-10-17 ENCOUNTER — Other Ambulatory Visit: Payer: Self-pay

## 2021-10-17 ENCOUNTER — Other Ambulatory Visit
Admission: RE | Admit: 2021-10-17 | Discharge: 2021-10-17 | Disposition: A | Discharge status: Home / Self Care | Payer: Medicaid Other | Source: Ambulatory Visit | Attending: Orthopedic Surgery | Admitting: Orthopedic Surgery

## 2021-10-17 DIAGNOSIS — Z01818 Encounter for other preprocedural examination: Secondary | ICD-10-CM | POA: Diagnosis not present

## 2021-10-17 DIAGNOSIS — Z20822 Contact with and (suspected) exposure to covid-19: Secondary | ICD-10-CM | POA: Diagnosis not present

## 2021-10-17 DIAGNOSIS — Z01812 Encounter for preprocedural laboratory examination: Secondary | ICD-10-CM

## 2021-10-17 DIAGNOSIS — Z7689 Persons encountering health services in other specified circumstances: Secondary | ICD-10-CM | POA: Diagnosis not present

## 2021-10-17 LAB — URINALYSIS, MICROSCOPIC (REFLEX)

## 2021-10-17 LAB — TYPE AND SCREEN
ABO/RH(D): A NEG
Antibody Screen: NEGATIVE

## 2021-10-17 LAB — BASIC METABOLIC PANEL
Anion gap: 9 (ref 5–15)
BUN: 13 mg/dL (ref 6–20)
CO2: 29 mmol/L (ref 22–32)
Calcium: 9.1 mg/dL (ref 8.9–10.3)
Chloride: 99 mmol/L (ref 98–111)
Creatinine, Ser: 0.83 mg/dL (ref 0.44–1.00)
GFR, Estimated: >60 mL/min (ref >60)
Glucose, Bld: 74 mg/dL (ref 70–99)
Potassium: 3.7 mmol/L (ref 3.5–5.1)
Sodium: 137 mmol/L (ref 135–145)

## 2021-10-17 LAB — CBC
HCT: 38.2 % (ref 36.0–46.0)
Hemoglobin: 12.5 g/dL (ref 12.0–15.0)
MCH: 28.3 pg (ref 26.0–34.0)
MCHC: 32.7 g/dL (ref 30.0–36.0)
MCV: 86.4 fL (ref 80.0–100.0)
Platelets: 256 10*3/uL (ref 150–400)
RBC: 4.42 MIL/uL (ref 3.87–5.11)
RDW: 13.3 % (ref 11.5–15.5)
WBC: 8.5 10*3/uL (ref 4.0–10.5)
nRBC: 0 % (ref 0.0–0.2)

## 2021-10-17 LAB — SURGICAL PCR SCREEN
MRSA, PCR: NEGATIVE
Staphylococcus aureus: NEGATIVE

## 2021-10-17 LAB — URINALYSIS, ROUTINE W REFLEX MICROSCOPIC
Bilirubin Urine: NEGATIVE
Glucose, UA: NEGATIVE mg/dL
Hgb urine dipstick: NEGATIVE
Ketones, ur: NEGATIVE mg/dL
Nitrite: NEGATIVE
Protein, ur: NEGATIVE mg/dL
Specific Gravity, Urine: 1.025 (ref 1.005–1.030)
pH: 5 (ref 5.0–8.0)

## 2021-10-18 LAB — SARS CORONAVIRUS 2 (TAT 6-24 HRS): SARS Coronavirus 2: NEGATIVE

## 2021-10-20 ENCOUNTER — Ambulatory Visit: Payer: Medicaid Other | Admitting: Nurse Practitioner

## 2021-10-20 ENCOUNTER — Other Ambulatory Visit: Payer: Medicaid Other

## 2021-10-23 ENCOUNTER — Ambulatory Visit: Payer: Medicaid Other | Admitting: Nurse Practitioner

## 2021-10-23 ENCOUNTER — Other Ambulatory Visit: Payer: Medicaid Other

## 2021-10-29 ENCOUNTER — Ambulatory Visit: Payer: Self-pay | Admitting: *Deleted

## 2021-10-29 NOTE — Telephone Encounter (Signed)
°  Chief Complaint: blood in urine Symptoms: blood in urine Frequency: started yesterday Pertinent Negatives: Patient denies fever, pain Disposition: [] ED /[] Urgent Care (no appt availability in office) / [x] Appointment(In office/virtual)/ []  [facility]/ [] Home Care/ [] Refused Recommended Disposition /[] [facility]/ []  Follow-up with PCP Additional Notes: Per patient request- appointment scheduled Frriday- advised call back if symptoms develop

## 2021-10-29 NOTE — Telephone Encounter (Signed)
Summary: blood in urine   Pt called in stating she noticed last night that when she went to the bathroom to urine, she noticed blood in the toilet and blood when she wiped. Please advise.      Reason for Disposition  Blood in urine  (Exception: could be normal menstrual bleeding)  Answer Assessment - Initial Assessment Questions 1. COLOR of URINE: "Describe the color of the urine."  (e.g., tea-colored, pink, red, blood clots, bloody)     Blood in urine 2. ONSET: "When did the bleeding start?"      Yesterday afternoon 3. EPISODES: "How many times has there been blood in the urine?" or "How many times today?"     Every time 4. PAIN with URINATION: "Is there any pain with passing your urine?" If Yes, ask: "How bad is the pain?"  (Scale 1-10; or mild, moderate, severe)    - MILD - complains slightly about urination hurting    - MODERATE - interferes with normal activities      - SEVERE - excruciating, unwilling or unable to urinate because of the pain      no 5. FEVER: "Do you have a fever?" If Yes, ask: "What is your temperature, how was it measured, and when did it start?"     no 6. ASSOCIATED SYMPTOMS: "Are you passing urine more frequently than usual?"     no 7. OTHER SYMPTOMS: "Do you have any other symptoms?" (e.g., back/flank pain, abdominal pain, vomiting)     no 8. PREGNANCY: "Is there any chance you are pregnant?" "When was your last menstrual period?"     *No Answer*  Protocols used: Urine - Blood In-A-AH

## 2021-10-31 ENCOUNTER — Other Ambulatory Visit: Payer: Self-pay

## 2021-10-31 ENCOUNTER — Encounter: Payer: Self-pay | Admitting: Internal Medicine

## 2021-10-31 ENCOUNTER — Ambulatory Visit: Payer: Medicaid Other | Admitting: Internal Medicine

## 2021-10-31 VITALS — BP 122/78 | HR 92 | Temp 98.0°F | Resp 16 | Ht 61.0 in | Wt 171.9 lb

## 2021-10-31 DIAGNOSIS — R319 Hematuria, unspecified: Secondary | ICD-10-CM | POA: Diagnosis not present

## 2021-10-31 DIAGNOSIS — Z7689 Persons encountering health services in other specified circumstances: Secondary | ICD-10-CM | POA: Diagnosis not present

## 2021-10-31 LAB — POCT URINALYSIS DIPSTICK
Bilirubin, UA: POSITIVE
Blood, UA: POSITIVE
Glucose, UA: NEGATIVE
Ketones, UA: POSITIVE
Leukocytes, UA: NEGATIVE
Nitrite, UA: NEGATIVE
Protein, UA: POSITIVE — AB
Spec Grav, UA: >=1.03 — AB (ref 1.010–1.025)
Urobilinogen, UA: 0.2 E.U./dL
pH, UA: 6 (ref 5.0–8.0)

## 2021-10-31 NOTE — Addendum Note (Signed)
Addended by: Docia Furl on: 10/31/2021 04:14 PM   Modules accepted: Orders

## 2021-10-31 NOTE — Progress Notes (Addendum)
Acute Office Visit  Subjective:    Patient ID: Kristina Reed, female    DOB: May 30, 1966, 56 y.o.   MRN: [id number]  Chief Complaint  Patient presents with   Hematuria    States in toilet bowl and on tissue.  States has had partial hysterectomy.  Denies any pain    HPI Patient is in today for hematuria. States she noticed bright red blood on the toilet paper when she was wiping after urinating about 3 days ago. Since then she has been having gross blood in her urine with clots. She is worried because it remains her of having a period even though she's had a hysterectomy. She did have a CT scan in December that showed multiple bilateral simple renal cysts with a stable cystic appearing area on her urethra. She does follow with Urology.   URINARY SYMPTOMS Dysuria: no Urinary frequency: yes Urgency: no Small volume voids: no Urinary incontinence: no Foul odor: no Hematuria: yes Abdominal pain: no Back pain: no Suprapubic pain/pressure: no Flank pain: no Fever:  no Vomiting: no Status: worse Previous urinary tract infection: yes Recurrent urinary tract infection: no Sexual activity: Not sexually active History of sexually transmitted disease: no Vaginal discharge: no  Past Medical History:  Diagnosis Date   Anxiety    Arthritis    right knee and right elbow   Cancer ([place])    Cervical CA with partial hysterectomy.   Cognitive impairment, mild, so stated    Depression    Diverticulosis    Eosinophilic esophagitis 47/82/9562   Biopsy Dec 2018   Esophageal dysphagia    Family history of breast cancer 05/2021   cancer genetic testing letter sent   Gallstones    GERD (gastroesophageal reflux disease)    History of cervical cancer    Hx of cervical cancer 01/30/2016   Hypertension    Hypothyroidism    Sleep apnea    does not use a C-PAP   Status post partial hysterectomy    Due to Cervical CA   Vaginal inclusion cyst     Past Surgical History:  Procedure  Laterality Date   ABDOMINAL HYSTERECTOMY     partial hysterectomy   BREAST BIOPSY Bilateral 01/01/2016   Fibroadenoma   COLONOSCOPY WITH PROPOFOL N/A 06/27/2015   Procedure: COLONOSCOPY WITH PROPOFOL;  Surgeon: Josefine Class, MD;  Location: [facility] ENDOSCOPY;  Service: Endoscopy;  Laterality: N/A;   COLONOSCOPY WITH PROPOFOL N/A 09/23/2017   Procedure: COLONOSCOPY WITH PROPOFOL;  Surgeon: Jonathon Bellows, MD;  Location: [facility] ENDOSCOPY;  Service: Gastroenterology;  Laterality: N/A;   COLONOSCOPY WITH PROPOFOL N/A 06/09/2021   Procedure: COLONOSCOPY WITH PROPOFOL;  Surgeon: Lin Landsman, MD;  Location: [facility] ENDOSCOPY;  Service: Gastroenterology;  Laterality: N/A;   ESOPHAGOGASTRODUODENOSCOPY (EGD) WITH PROPOFOL N/A 07/22/2017   Procedure: ESOPHAGOGASTRODUODENOSCOPY (EGD) WITH PROPOFOL;  Surgeon: Jonathon Bellows, MD;  Location[facility] ENDOSCOPY;  Service: Gastroenterology;  Laterality: N/A;   ESOPHAGOGASTRODUODENOSCOPY (EGD) WITH PROPOFOL N/A 03/31/2018   Procedure: ESOPHAGOGASTRODUODENOSCOPY (EGD) WITH PROPOFOL;  Surgeon: Lin Landsman, MD;  Location: [facility] ENDOSCOPY;  Service: Gastroenterology;  Laterality: N/A;   ESOPHAGOGASTRODUODENOSCOPY (EGD) WITH PROPOFOL N/A 08/25/2021   Procedure: ESOPHAGOGASTRODUODENOSCOPY (EGD) WITH PROPOFOL;  Surgeon: Lin Landsman, MD;  Location: [facility] ENDOSCOPY;  Service: Gastroenterology;  Laterality: N/A;   KNEE ARTHROSCOPY Right 07/22/2016   Procedure: ARTHROSCOPY KNEE debridement microfracture;  Surgeon: Leanor Kail, MD;  Location: [facility] ORS;  Service: Orthopedics;  Laterality: Right;   KNEE ARTHROSCOPY WITH MEDIAL MENISECTOMY Left 11/10/2017  Procedure: KNEE ARTHROSCOPY WITH PARTIAL MEDIAL MENISECTOMY&patella femoral debridement, & ablation;  Surgeon: Leanor Kail, MD;  Location: [facility] ORS;  Service: Orthopedics;  Laterality: Left;   TUBAL LIGATION      Family History  Problem Relation Age of Onset   Diabetes Mother    Lung cancer Father 65    Breast cancer Maternal Grandmother 49   Cancer Paternal Grandmother        cancer?   Breast cancer Maternal Aunt        80s   Cirrhosis Cousin    Breast cancer Cousin    Breast cancer Other        x4    Social History   Socioeconomic History   Marital status: Legally Separated    Spouse name: Jeneen Rinks   Number of children: 2   Years of education: Not on file   Highest education level: Not on file  Occupational History   Occupation: un employed  Tobacco Use   Smoking status: Never   Smokeless tobacco: Never  Vaping Use   Vaping Use: Never used  Substance and Sexual Activity   Alcohol use: Not Currently   Drug use: Not Currently   Sexual activity: Not Currently    Birth control/protection: Surgical  Other Topics Concern   Not on file  Social History Narrative   Not on file   Social Determinants of Health   Financial Resource Strain: Low Risk    Difficulty of Paying Living Expenses: Not hard at all  Food Insecurity: No Food Insecurity   Worried About Charity fundraiser in the Last Year: Never true   Arboriculturist in the Last Year: Never true  Transportation Needs: Unmet Transportation Needs   Lack of Transportation (Medical): Yes   Lack of Transportation (Non-Medical): Yes  Physical Activity: Inactive   Days of Exercise per Week: 0 days   Minutes of Exercise per Session: 0 min  Stress: Stress Concern Present   Feeling of Stress : To some extent  Social Connections: Socially Isolated   Frequency of Communication with Friends and Family: Once a week   Frequency of Social Gatherings with Friends and Family: Once a week   Attends Religious Services: Never   Marine scientist or Organizations: No   Attends Music therapist: Never   Marital Status: Separated  Intimate Partner Violence: Not At Risk   Fear of Current or Ex-Partner: No   Emotionally Abused: No   Physically Abused: No   Sexually Abused: No    Outpatient Medications Prior to Visit   Medication Sig Dispense Refill   cloNIDine (CATAPRES) 0.2 MG tablet Take 1 tablet (0.2 mg total) by mouth 2 (two) times daily. 90 tablet 3   diclofenac (VOLTAREN) 75 MG EC tablet Take 75 mg by mouth 2 (two) times daily.     estradiol (ESTRACE) 1 MG tablet Take 1 mg by mouth daily.     FLUoxetine (PROZAC) 20 MG capsule Take 1 capsule (20 mg total) by mouth daily. 90 capsule 3   levothyroxine (SYNTHROID) 75 MCG tablet Take 1 tablet (75 mcg total) by mouth daily before breakfast. 90 tablet 1   meloxicam (MOBIC) 15 MG tablet Take 15 mg by mouth daily.     omeprazole (PRILOSEC) 40 MG capsule Take 1 capsule (40 mg total) by mouth 2 (two) times daily before a meal. 90 capsule 2   potassium chloride SA (KLOR-CON M) 20 MEQ tablet Take 1 tablet (20 mEq total)  by mouth daily for 5 days. (Patient not taking: Reported on 10/07/2021) 5 tablet 0   traMADol (ULTRAM) 50 MG tablet Take 50 mg by mouth every 6 (six) hours as needed for pain.     No facility-administered medications prior to visit.    Allergies  Allergen Reactions   Prednisone Nausea And Vomiting   Methylprednisolone Palpitations    Increased heart rate   Percocet [Oxycodone-Acetaminophen] Palpitations    Review of Systems  Constitutional:  Negative for chills and fever.  Gastrointestinal:  Negative for abdominal pain, blood in stool, constipation, diarrhea, nausea and vomiting.  Genitourinary:  Positive for frequency and hematuria. Negative for difficulty urinating, dysuria, pelvic pain, vaginal discharge and vaginal pain.      Objective:    Physical Exam Constitutional:      Appearance: Normal appearance.  HENT:     Head: Normocephalic and atraumatic.  Eyes:     Conjunctiva/sclera: Conjunctivae normal.  Cardiovascular:     Rate and Rhythm: Normal rate and regular rhythm.  Pulmonary:     Effort: Pulmonary effort is normal.     Breath sounds: Normal breath sounds.  Abdominal:     General: Bowel sounds are normal. There is no  distension.     Palpations: Abdomen is soft.     Tenderness: There is no abdominal tenderness. There is no right CVA tenderness, left CVA tenderness or guarding.  Genitourinary:    General: Normal vulva.     Comments: External genitalia within normal limits.  Vaginal mucosa pink, moist, normal rugae.  No discharge or bleeding noted on speculum exam.    Skin:    General: Skin is warm and dry.  Neurological:     General: No focal deficit present.     Mental Status: She is alert. Mental status is at baseline.  Psychiatric:        Mood and Affect: Mood normal.        Behavior: Behavior normal.    LMP 05/09/1993 (Approximate)  Wt Readings from Last 3 Encounters:  09/23/21 172 lb 2 oz (78.1 kg)  09/16/21 171 lb (77.6 kg)  08/31/21 171 lb 15.3 oz (78 kg)    Health Maintenance Due  Topic Date Due   Zoster Vaccines- Shingrix (1 of 2) Never done   COVID-19 Vaccine (3 - Booster for Janssen series) 09/30/2020    There are no preventive care reminders to display for this patient.   Lab Results  Component Value Date   TSH 2.82 03/26/2021   Lab Results  Component Value Date   WBC 8.5 10/17/2021   HGB 12.5 10/17/2021   HCT 38.2 10/17/2021   MCV 86.4 10/17/2021   PLT 256 10/17/2021   Lab Results  Component Value Date   NA 137 10/17/2021   K 3.7 10/17/2021   CO2 29 10/17/2021   GLUCOSE 74 10/17/2021   BUN 13 10/17/2021   CREATININE 0.83 10/17/2021   BILITOT 0.6 08/31/2021   ALKPHOS 70 08/31/2021   AST 34 08/31/2021   ALT 46 (H) 08/31/2021   PROT 7.9 08/31/2021   ALBUMIN 4.0 08/31/2021   CALCIUM 9.1 10/17/2021   ANIONGAP 9 10/17/2021   EGFR 90 03/26/2021   Lab Results  Component Value Date   CHOL 220 (H) 03/26/2021   Lab Results  Component Value Date   HDL 65 03/26/2021   Lab Results  Component Value Date   LDLCALC 120 (H) 03/26/2021   Lab Results  Component Value Date   TRIG 231 (H)  03/26/2021   Lab Results  Component Value Date   CHOLHDL 3.4 03/26/2021    Lab Results  Component Value Date   HGBA1C 5.7 (H) 03/26/2021       Assessment & Plan:   1. Hematuria, unspecified type: UA here negative for infection but positive for blood/protein. Vaginal exam within normal limits. Uncertain about urethral cyst, which appeared stable on CT scan from December. Will pursue renal US as symptoms are new to revaluate renal cysts. Labs today. Referral placed to Urology for evaluation of ureteral cyst on CT scan and hematuria.   - POCT Urinalysis Dipstick - US Renal; Future - CBC w/Diff/Platelet - COMPLETE METABOLIC PANEL WITH GFR - Ambulatory referral to Urology    Teodora Medici, DO

## 2021-10-31 NOTE — Patient Instructions (Addendum)
It was great seeing you today!  Plan discussed at today's visit: -Blood work ordered today, results will be uploaded to [place].  -Ultrasound of kidneys to be scheduled  Please call and schedule [phone]  Follow up in: 2 weeks   Take care and let us know if you have any questions or concerns prior to your next visit.  Dr. Rosana Berger

## 2021-11-01 LAB — CBC WITH DIFFERENTIAL/PLATELET
Absolute Monocytes: 428 cells/uL (ref 200–950)
Basophils Absolute: 59 cells/uL (ref 0–200)
Basophils Relative: 0.7 %
Eosinophils Absolute: 722 cells/uL — ABNORMAL HIGH (ref 15–500)
Eosinophils Relative: 8.6 %
HCT: 38.6 % (ref 35.0–45.0)
Hemoglobin: 13.1 g/dL (ref 11.7–15.5)
Lymphs Abs: 2965 cells/uL (ref 850–3900)
MCH: 28.9 pg (ref 27.0–33.0)
MCHC: 33.9 g/dL (ref 32.0–36.0)
MCV: 85 fL (ref 80.0–100.0)
MPV: 10.2 fL (ref 7.5–12.5)
Monocytes Relative: 5.1 %
Neutro Abs: 4225 cells/uL (ref 1500–7800)
Neutrophils Relative %: 50.3 %
Platelets: 237 10*3/uL (ref 140–400)
RBC: 4.54 10*6/uL (ref 3.80–5.10)
RDW: 12.9 % (ref 11.0–15.0)
Total Lymphocyte: 35.3 %
WBC: 8.4 10*3/uL (ref 3.8–10.8)

## 2021-11-01 LAB — COMPLETE METABOLIC PANEL WITH GFR
AG Ratio: 1.4 (calc) (ref 1.0–2.5)
ALT: 39 U/L — ABNORMAL HIGH (ref 6–29)
AST: 26 U/L (ref 10–35)
Albumin: 4.3 g/dL (ref 3.6–5.1)
Alkaline phosphatase (APISO): 81 U/L (ref 37–153)
BUN: 13 mg/dL (ref 7–25)
CO2: 27 mmol/L (ref 20–32)
Calcium: 9.3 mg/dL (ref 8.6–10.4)
Chloride: 101 mmol/L (ref 98–110)
Creat: 0.83 mg/dL (ref 0.50–1.03)
Globulin: 3.1 g/dL (calc) (ref 1.9–3.7)
Glucose, Bld: 86 mg/dL (ref 65–99)
Potassium: 4.1 mmol/L (ref 3.5–5.3)
Sodium: 138 mmol/L (ref 135–146)
Total Bilirubin: 0.3 mg/dL (ref 0.2–1.2)
Total Protein: 7.4 g/dL (ref 6.1–8.1)
eGFR: 84 mL/min/{1.73_m2} (ref >=60)

## 2021-11-11 ENCOUNTER — Ambulatory Visit
Admission: RE | Admit: 2021-11-11 | Discharge: 2021-11-11 | Disposition: A | Discharge status: Home / Self Care | Payer: Medicaid Other | Source: Ambulatory Visit | Attending: Internal Medicine | Admitting: Internal Medicine

## 2021-11-11 ENCOUNTER — Other Ambulatory Visit: Payer: Self-pay

## 2021-11-11 DIAGNOSIS — R319 Hematuria, unspecified: Secondary | ICD-10-CM | POA: Insufficient documentation

## 2021-11-11 DIAGNOSIS — Z7689 Persons encountering health services in other specified circumstances: Secondary | ICD-10-CM | POA: Diagnosis not present

## 2021-11-12 DIAGNOSIS — Z419 Encounter for procedure for purposes other than remedying health state, unspecified: Secondary | ICD-10-CM | POA: Diagnosis not present

## 2021-11-14 ENCOUNTER — Encounter: Payer: Self-pay | Admitting: Internal Medicine

## 2021-11-14 ENCOUNTER — Ambulatory Visit (INDEPENDENT_AMBULATORY_CARE_PROVIDER_SITE_OTHER): Payer: Medicaid Other | Admitting: Internal Medicine

## 2021-11-14 VITALS — BP 116/74 | HR 90 | Temp 98.1°F | Resp 16 | Ht 61.0 in | Wt 174.2 lb

## 2021-11-14 DIAGNOSIS — R319 Hematuria, unspecified: Secondary | ICD-10-CM | POA: Diagnosis not present

## 2021-11-14 DIAGNOSIS — Z7689 Persons encountering health services in other specified circumstances: Secondary | ICD-10-CM | POA: Diagnosis not present

## 2021-11-14 NOTE — Progress Notes (Signed)
Established Patient Office Visit  Subjective:  Patient ID: Kristina Reed, female    DOB: Oct 29, 1965  Age: 56 y.o. MRN: [id number]  CC:  Chief Complaint  Patient presents with   Follow-up   Hematuria    Pt states bleeding has stopped    HPI Kristina Reed presents for follow up on hematuria. Was last seen in the office 2 weeks ago for this issue. UA here negative for infection but positive for blood/protein. Vaginal exam within normal limits. Uncertain about urethral cyst, which appeared stable on CT scan from December. Renal US negative, blood work reassuring. Referral placed to Urology for evaluation of ureteral cyst on CT scan and hematuria, which is scheduled for next week. She does have a history of multiple catheters being placed but this was years ago, not currently. Currently the bleeding has stopped, she had hematuria for 5 days total. Today she denies dysuria, hematuria, increased urinary frequency or fevers. Denies suprapubic or abdominal pain. Does have some increased urinary urgency but this is normal for her,no incontinence.    Past Medical History:  Diagnosis Date   Anxiety    Arthritis    right knee and right elbow   Cancer ([place])    Cervical CA with partial hysterectomy.   Cognitive impairment, mild, so stated    Depression    Diverticulosis    Eosinophilic esophagitis 45/80/9983   Biopsy Dec 2018   Esophageal dysphagia    Family history of breast cancer 05/2021   cancer genetic testing letter sent   Gallstones    GERD (gastroesophageal reflux disease)    History of cervical cancer    Hx of cervical cancer 01/30/2016   Hypertension    Hypothyroidism    Sleep apnea    does not use a C-PAP   Status post partial hysterectomy    Due to Cervical CA   Vaginal inclusion cyst     Past Surgical History:  Procedure Laterality Date   ABDOMINAL HYSTERECTOMY     partial hysterectomy   BREAST BIOPSY Bilateral 01/01/2016   Fibroadenoma   COLONOSCOPY WITH PROPOFOL N/A  06/27/2015   Procedure: COLONOSCOPY WITH PROPOFOL;  Surgeon: Josefine Class, MD;  Location: [facility] ENDOSCOPY;  Service: Endoscopy;  Laterality: N/A;   COLONOSCOPY WITH PROPOFOL N/A 09/23/2017   Procedure: COLONOSCOPY WITH PROPOFOL;  Surgeon: Jonathon Bellows, MD;  Location: [facility] ENDOSCOPY;  Service: Gastroenterology;  Laterality: N/A;   COLONOSCOPY WITH PROPOFOL N/A 06/09/2021   Procedure: COLONOSCOPY WITH PROPOFOL;  Surgeon: Lin Landsman, MD;  Location: [facility] ENDOSCOPY;  Service: Gastroenterology;  Laterality: N/A;   ESOPHAGOGASTRODUODENOSCOPY (EGD) WITH PROPOFOL N/A 07/22/2017   Procedure: ESOPHAGOGASTRODUODENOSCOPY (EGD) WITH PROPOFOL;  Surgeon: Jonathon Bellows, MD;  Location: [facility] ENDOSCOPY;  Service: Gastroenterology;  Laterality: N/A;   ESOPHAGOGASTRODUODENOSCOPY (EGD) WITH PROPOFOL N/A 03/31/2018   Procedure: ESOPHAGOGASTRODUODENOSCOPY (EGD) WITH PROPOFOL;  Surgeon: Lin Landsman, MD;  Location: [facility] ENDOSCOPY;  Service: Gastroenterology;  Laterality: N/A;   ESOPHAGOGASTRODUODENOSCOPY (EGD) WITH PROPOFOL N/A 08/25/2021   Procedure: ESOPHAGOGASTRODUODENOSCOPY (EGD) WITH PROPOFOL;  Surgeon: Lin Landsman, MD;  Location: [facility] ENDOSCOPY;  Service: Gastroenterology;  Laterality: N/A;   KNEE ARTHROSCOPY Right 07/22/2016   Procedure: ARTHROSCOPY KNEE debridement microfracture;  Surgeon: Leanor Kail, MD;  Location: [facility] ORS;  Service: Orthopedics;  Laterality: Right;   KNEE ARTHROSCOPY WITH MEDIAL MENISECTOMY Left 11/10/2017   Procedure: KNEE ARTHROSCOPY WITH PARTIAL MEDIAL MENISECTOMY&patella femoral debridement, & ablation;  Surgeon: Leanor Kail, MD;  Location: [facility] ORS;  Service: Orthopedics;  Left;  ° TUBAL LIGATION    ° ° °Family History  °Problem Relation Age of Onset  ° Diabetes Mother   ° Lung cancer Father 62  ° Breast cancer Maternal Grandmother 70  ° Cancer Paternal Grandmother   °     cancer?  ° Breast cancer Maternal Aunt   °     80s  ° Cirrhosis Cousin   °  Breast cancer Cousin   ° Breast cancer Other   °     x4  ° ° °Social History  ° °Socioeconomic History  ° Marital status: Legally Separated  °  Spouse name: James  ° Number of children: 2  ° Years of education: Not on file  ° Highest education level: Not on file  °Occupational History  ° Occupation: un employed  °Tobacco Use  ° Smoking status: Never  ° Smokeless tobacco: Never  °Vaping Use  ° Vaping Use: Never used  °Substance and Sexual Activity  ° Alcohol use: Not Currently  ° Drug use: Not Currently  ° Sexual activity: Not Currently  °  Birth control/protection: Surgical  °Other Topics Concern  ° Not on file  °Social History Narrative  ° Not on file  ° °Social Determinants of Health  ° °Financial Resource Strain: Low Risk   ° Difficulty of Paying Living Expenses: Not hard at all  °Food Insecurity: No Food Insecurity  ° Worried About Running Out of Food in the Last Year: Never true  ° Ran Out of Food in the Last Year: Never true  °Transportation Needs: Unmet Transportation Needs  ° Lack of Transportation (Medical): Yes  ° Lack of Transportation (Non-Medical): Yes  °Physical Activity: Inactive  ° Days of Exercise per Week: 0 days  ° Minutes of Exercise per Session: 0 min  °Stress: Stress Concern Present  ° Feeling of Stress : To some extent  °Social Connections: Socially Isolated  ° Frequency of Communication with Friends and Family: Once a week  ° Frequency of Social Gatherings with Friends and Family: Once a week  ° Attends Religious Services: Never  ° Active Member of Clubs or Organizations: No  ° Attends Club or Organization Meetings: Never  ° Marital Status: Separated  °Intimate Partner Violence: Not At Risk  ° Fear of Current or Ex-Partner: No  ° Emotionally Abused: No  ° Physically Abused: No  ° Sexually Abused: No  ° ° °Outpatient Medications Prior to Visit  °Medication Sig Dispense Refill  ° cloNIDine (CATAPRES) 0.2 MG tablet Take 1 tablet (0.2 mg total) by mouth 2 (two) times daily. 90 tablet 3  °  diclofenac (VOLTAREN) 75 MG EC tablet Take 75 mg by mouth 2 (two) times daily.    ° estradiol (ESTRACE) 1 MG tablet Take 1 mg by mouth daily.    ° FLUoxetine (PROZAC) 20 MG capsule Take 1 capsule (20 mg total) by mouth daily. 90 capsule 3  ° levothyroxine (SYNTHROID) 75 MCG tablet Take 1 tablet (75 mcg total) by mouth daily before breakfast. 90 tablet 1  ° meloxicam (MOBIC) 15 MG tablet Take 15 mg by mouth daily.    ° omeprazole (PRILOSEC) 40 MG capsule Take 1 capsule (40 mg total) by mouth 2 (two) times daily before a meal. 90 capsule 2  ° potassium chloride SA (KLOR-CON M) 20 MEQ tablet Take 1 tablet (20 mEq total) by mouth daily for 5 days. (Patient not taking: Reported on 10/07/2021) 5 tablet 0  ° traMADol (ULTRAM) 50 MG tablet Take 50 mg   by mouth every 6 (six) hours as needed for pain.    ° °No facility-administered medications prior to visit.  ° ° °Allergies  °Allergen Reactions  ° Prednisone Nausea And Vomiting  ° Methylprednisolone Palpitations  °  Increased heart rate  ° Percocet [Oxycodone-Acetaminophen] Palpitations  ° ° °ROS °Review of Systems  °Constitutional:  Negative for chills and fever.  °Gastrointestinal:  Negative for abdominal pain.  °Genitourinary:  Positive for urgency. Negative for dysuria, flank pain, frequency and hematuria.  ° °  °Objective:  °  °Physical Exam °Constitutional:   °   Appearance: Normal appearance.  °HENT:  °   Head: Normocephalic and atraumatic.  °Eyes:  °   Conjunctiva/sclera: Conjunctivae normal.  °Cardiovascular:  °   Rate and Rhythm: Normal rate and regular rhythm.  °Pulmonary:  °   Effort: Pulmonary effort is normal.  °   Breath sounds: Normal breath sounds.  °Abdominal:  °   General: There is no distension.  °   Palpations: Abdomen is soft.  °   Tenderness: There is no abdominal tenderness. There is no right CVA tenderness, left CVA tenderness or guarding.  °Genitourinary: °   Comments:  ° °Skin: °   General: Skin is warm and dry.  °Neurological:  °   General: No focal  deficit present.  °   Mental Status: She is alert. Mental status is at baseline.  °Psychiatric:     °   Mood and Affect: Mood normal.     °   Behavior: Behavior normal.  ° ° °BP 116/74    Pulse 90    Temp 98.1 °F (36.7 °C) (Oral)    Resp 16    Ht 5' 1" (1.549 m)    Wt 174 lb 3.2 oz (79 kg)    LMP 05/09/1993 (Approximate)    SpO2 96%    BMI 32.91 kg/m²  °Wt Readings from Last 3 Encounters:  °10/31/21 171 lb 14.4 oz (78 kg)  °09/23/21 172 lb 2 oz (78.1 kg)  °09/16/21 171 lb (77.6 kg)  ° ° ° °Health Maintenance Due  °Topic Date Due  ° Zoster Vaccines- Shingrix (1 of 2) Never done  ° COVID-19 Vaccine (3 - Booster for Janssen series) 09/30/2020  ° ° °There are no preventive care reminders to display for this patient. ° °Lab Results  °Component Value Date  ° TSH 2.82 03/26/2021  ° °Lab Results  °Component Value Date  ° WBC 8.4 10/31/2021  ° HGB 13.1 10/31/2021  ° HCT 38.6 10/31/2021  ° MCV 85.0 10/31/2021  ° PLT 237 10/31/2021  ° °Lab Results  °Component Value Date  ° NA 138 10/31/2021  ° K 4.1 10/31/2021  ° CO2 27 10/31/2021  ° GLUCOSE 86 10/31/2021  ° BUN 13 10/31/2021  ° CREATININE 0.83 10/31/2021  ° BILITOT 0.3 10/31/2021  ° ALKPHOS 70 08/31/2021  ° AST 26 10/31/2021  ° ALT 39 (H) 10/31/2021  ° PROT 7.4 10/31/2021  ° ALBUMIN 4.0 08/31/2021  ° CALCIUM 9.3 10/31/2021  ° ANIONGAP 9 10/17/2021  ° EGFR 84 10/31/2021  ° °Lab Results  °Component Value Date  ° CHOL 220 (H) 03/26/2021  ° °Lab Results  °Component Value Date  ° HDL 65 03/26/2021  ° °Lab Results  °Component Value Date  ° LDLCALC 120 (H) 03/26/2021  ° °Lab Results  °Component Value Date  ° TRIG 231 (H) 03/26/2021  ° °Lab Results  °Component Value Date  ° CHOLHDL 3.4 03/26/2021  ° °Lab Results  °  Component Value Date  ° HGBA1C 5.7 (H) 03/26/2021  ° ° °  °Assessment & Plan:  °1. Hematuria, unspecified type: Hematuria resolved today, blood work and renal ultrasound reassuring. Seeing Urology next week for evaluation of urethral cyst seen on CT in December. Follow up  as needed. ° ° °Follow-up: Return if symptoms worsen or fail to improve.  ° ° ° , DO °

## 2021-11-17 ENCOUNTER — Inpatient Hospital Stay
Admission: RE | Admit: 2021-11-17 | Discharge: 2021-11-17 | Disposition: A | Discharge status: Home / Self Care | Payer: Medicaid Other | Source: Ambulatory Visit

## 2021-11-17 DIAGNOSIS — Z7689 Persons encountering health services in other specified circumstances: Secondary | ICD-10-CM | POA: Diagnosis not present

## 2021-11-17 NOTE — OR Nursing (Signed)
Called patient and reviewed upcoming surgery due to being rescheduled due to Medicaid. Patient kept previous AVS, soap, instructions and she is prepared to have her surgery on November 24, 2021. I reviewed medications to stop and some to take on morning of procedure and she is informed and competent to see that this is taken care of.  ?

## 2021-11-20 ENCOUNTER — Other Ambulatory Visit
Admission: RE | Admit: 2021-11-20 | Discharge: 2021-11-20 | Disposition: A | Discharge status: Home / Self Care | Payer: Medicaid Other | Source: Ambulatory Visit | Attending: Orthopedic Surgery | Admitting: Orthopedic Surgery

## 2021-11-20 ENCOUNTER — Encounter: Payer: Self-pay | Admitting: Urology

## 2021-11-20 ENCOUNTER — Other Ambulatory Visit: Payer: Self-pay

## 2021-11-20 ENCOUNTER — Ambulatory Visit: Payer: Medicaid Other | Admitting: Urology

## 2021-11-20 ENCOUNTER — Ambulatory Visit (INDEPENDENT_AMBULATORY_CARE_PROVIDER_SITE_OTHER): Payer: Medicaid Other | Admitting: Urology

## 2021-11-20 VITALS — BP 152/89 | HR 87 | Ht 61.0 in | Wt 173.5 lb

## 2021-11-20 DIAGNOSIS — Z01812 Encounter for preprocedural laboratory examination: Secondary | ICD-10-CM | POA: Insufficient documentation

## 2021-11-20 DIAGNOSIS — N361 Urethral diverticulum: Secondary | ICD-10-CM

## 2021-11-20 DIAGNOSIS — Z20822 Contact with and (suspected) exposure to covid-19: Secondary | ICD-10-CM

## 2021-11-20 DIAGNOSIS — Z7689 Persons encountering health services in other specified circumstances: Secondary | ICD-10-CM | POA: Diagnosis not present

## 2021-11-20 DIAGNOSIS — R319 Hematuria, unspecified: Secondary | ICD-10-CM | POA: Diagnosis not present

## 2021-11-20 NOTE — Patient Instructions (Signed)

## 2021-11-20 NOTE — Progress Notes (Signed)
? ?  11/20/2021 ?11:24 AM  ? ?Kristina Reed ?Mar 06, 1966 ?[id number] ? ?Reason for visit: Gross hematuria ? ?HPI: ?56 year old female who I saw last in February 2021 for possible urinary retention.  She had been having some lower abdominal pressure and a Foley catheter was placed in the ER for a bladder scan of only 100 mL, and she passed a voiding trial and has done well since that time with no significant urinary complaints.  She has some baseline mild urgency when washing dishes, but otherwise denies urinary symptoms.  She presents today because about 2 weeks ago after wiping after urination she noticed some blood on the tissue, and may be some small amount of blood in the toilet.  She denies any dysuria.  Multiple urine samples over the last year have shown asymptomatic bacteriuria and have been contaminated with squamous cells, but have not shown any RBCs.  Urinalysis today is pending.  Her history is also notable for cervical cancer and partial hysterectomy in the distant past. ? ?She was recently seen in the ER in December 2022 for nausea and abdominal pain, and a CT abdomen and pelvis with contrast was performed.  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 ? ?We discussed common possible etiologies of hematuria including  malignancy, urolithiasis, medical renal disease, and idiopathic. Standard workup recommended by the AUA includes imaging with CT urogram to assess the upper tracts, and cystoscopy. Cytology is performed on patient's with gross hematuria to look for malignant cells in the urine. ? ?I recommended cystoscopy to complete work-up.  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  We will also be able to assess this somewhat on upcoming cystoscopy. ? ?Billey Co, MD ? ?[facility] [place] ?[address] ?[place], [place] [address] ?[phone] ? ? ?

## 2021-11-21 LAB — URINALYSIS, COMPLETE
Bilirubin, UA: NEGATIVE
Glucose, UA: NEGATIVE
Ketones, UA: NEGATIVE
Leukocytes,UA: NEGATIVE
Nitrite, UA: NEGATIVE
Protein,UA: NEGATIVE
RBC, UA: NEGATIVE
Specific Gravity, UA: 1.025 (ref 1.005–1.030)
Urobilinogen, Ur: 0.2 mg/dL (ref 0.2–1.0)
pH, UA: 6.5 (ref 5.0–7.5)

## 2021-11-21 LAB — MICROSCOPIC EXAMINATION

## 2021-11-21 LAB — SARS CORONAVIRUS 2 (TAT 6-24 HRS): SARS Coronavirus 2: NEGATIVE

## 2021-11-24 ENCOUNTER — Other Ambulatory Visit: Payer: Self-pay

## 2021-11-24 ENCOUNTER — Ambulatory Visit: Payer: Medicaid Other | Admitting: Anesthesiology

## 2021-11-24 ENCOUNTER — Encounter: Payer: Self-pay | Admitting: Orthopedic Surgery

## 2021-11-24 ENCOUNTER — Inpatient Hospital Stay
Admission: RE | Admit: 2021-11-24 | Discharge: 2021-11-26 | DRG: 470 | Disposition: A | Discharge status: Home Health | Payer: Medicaid Other | Attending: Orthopedic Surgery | Admitting: Orthopedic Surgery

## 2021-11-24 ENCOUNTER — Encounter
Admission: RE | Disposition: A | Discharge status: Home Health | Payer: Self-pay | Source: Home / Self Care | Attending: Orthopedic Surgery

## 2021-11-24 ENCOUNTER — Observation Stay: Payer: Medicaid Other

## 2021-11-24 ENCOUNTER — Ambulatory Visit: Payer: Medicaid Other

## 2021-11-24 DIAGNOSIS — G4730 Sleep apnea, unspecified: Secondary | ICD-10-CM | POA: Diagnosis present

## 2021-11-24 DIAGNOSIS — M25761 Osteophyte, right knee: Secondary | ICD-10-CM | POA: Diagnosis present

## 2021-11-24 DIAGNOSIS — Z803 Family history of malignant neoplasm of breast: Secondary | ICD-10-CM

## 2021-11-24 DIAGNOSIS — I10 Essential (primary) hypertension: Secondary | ICD-10-CM | POA: Diagnosis present

## 2021-11-24 DIAGNOSIS — Z90711 Acquired absence of uterus with remaining cervical stump: Secondary | ICD-10-CM

## 2021-11-24 DIAGNOSIS — E039 Hypothyroidism, unspecified: Secondary | ICD-10-CM | POA: Diagnosis present

## 2021-11-24 DIAGNOSIS — R339 Retention of urine, unspecified: Secondary | ICD-10-CM | POA: Diagnosis not present

## 2021-11-24 DIAGNOSIS — E669 Obesity, unspecified: Secondary | ICD-10-CM | POA: Diagnosis present

## 2021-11-24 DIAGNOSIS — Z888 Allergy status to other drugs, medicaments and biological substances status: Secondary | ICD-10-CM

## 2021-11-24 DIAGNOSIS — Z791 Long term (current) use of non-steroidal anti-inflammatories (NSAID): Secondary | ICD-10-CM

## 2021-11-24 DIAGNOSIS — Z8541 Personal history of malignant neoplasm of cervix uteri: Secondary | ICD-10-CM

## 2021-11-24 DIAGNOSIS — Z6832 Body mass index (BMI) 32.0-32.9, adult: Secondary | ICD-10-CM

## 2021-11-24 DIAGNOSIS — Z96651 Presence of right artificial knee joint: Secondary | ICD-10-CM | POA: Diagnosis not present

## 2021-11-24 DIAGNOSIS — Z79899 Other long term (current) drug therapy: Secondary | ICD-10-CM

## 2021-11-24 DIAGNOSIS — Z419 Encounter for procedure for purposes other than remedying health state, unspecified: Secondary | ICD-10-CM

## 2021-11-24 DIAGNOSIS — Z96659 Presence of unspecified artificial knee joint: Secondary | ICD-10-CM

## 2021-11-24 DIAGNOSIS — K219 Gastro-esophageal reflux disease without esophagitis: Secondary | ICD-10-CM | POA: Diagnosis present

## 2021-11-24 DIAGNOSIS — G894 Chronic pain syndrome: Secondary | ICD-10-CM | POA: Diagnosis present

## 2021-11-24 DIAGNOSIS — Z885 Allergy status to narcotic agent status: Secondary | ICD-10-CM

## 2021-11-24 DIAGNOSIS — Z79890 Hormone replacement therapy (postmenopausal): Secondary | ICD-10-CM

## 2021-11-24 DIAGNOSIS — M1711 Unilateral primary osteoarthritis, right knee: Principal | ICD-10-CM | POA: Diagnosis present

## 2021-11-24 DIAGNOSIS — G8918 Other acute postprocedural pain: Secondary | ICD-10-CM | POA: Diagnosis not present

## 2021-11-24 DIAGNOSIS — M25561 Pain in right knee: Secondary | ICD-10-CM | POA: Diagnosis not present

## 2021-11-24 LAB — TYPE AND SCREEN
ABO/RH(D): A NEG
Antibody Screen: NEGATIVE

## 2021-11-24 SURGERY — ARTHROPLASTY, KNEE, TOTAL
Anesthesia: Regional | Site: Knee | Laterality: Right

## 2021-11-24 MED ORDER — SODIUM CHLORIDE 0.9 % IR SOLN
Status: DC | PRN
Start: 2021-11-24 — End: 2021-11-24
  Administered 2021-11-24: 500 mL

## 2021-11-24 MED ORDER — LACTATED RINGERS IV SOLN: INTRAVENOUS | Status: DC

## 2021-11-24 MED ORDER — FENTANYL CITRATE PF 50 MCG/ML IJ SOSY
PREFILLED_SYRINGE | INTRAMUSCULAR | Status: AC
  Administered 2021-11-24: 50 ug via INTRAVENOUS
  Filled 2021-11-24: qty 2

## 2021-11-24 MED ORDER — SURGIRINSE WOUND IRRIGATION SYSTEM - OPTIME: TOPICAL | Status: DC | PRN

## 2021-11-24 MED ORDER — MIDAZOLAM HCL 2 MG/2ML IJ SOLN
INTRAMUSCULAR | Status: AC
  Administered 2021-11-24: 1 mg via INTRAVENOUS
  Filled 2021-11-24: qty 2

## 2021-11-24 MED ORDER — PROPOFOL 500 MG/50ML IV EMUL
INTRAVENOUS | Status: DC | PRN
  Administered 2021-11-24: 50 ug/kg/min via INTRAVENOUS

## 2021-11-24 MED ORDER — ESTRADIOL 1 MG PO TABS
1.0000 mg | ORAL_TABLET | Freq: Every day | ORAL | Status: DC
  Administered 2021-11-24 – 2021-11-26 (×3): 1 mg via ORAL
  Filled 2021-11-24 (×3): qty 1

## 2021-11-24 MED ORDER — MIDAZOLAM HCL 2 MG/2ML IJ SOLN
1.0000 mg | INTRAMUSCULAR | Status: AC | PRN
  Administered 2021-11-24: 1 mg via INTRAVENOUS

## 2021-11-24 MED ORDER — FENTANYL CITRATE (PF) 100 MCG/2ML IJ SOLN
INTRAMUSCULAR | Status: AC
  Filled 2021-11-24: qty 2

## 2021-11-24 MED ORDER — CEFAZOLIN SODIUM-DEXTROSE 2-4 GM/100ML-% IV SOLN
INTRAVENOUS | Status: AC
  Filled 2021-11-24: qty 100

## 2021-11-24 MED ORDER — TRANEXAMIC ACID-NACL 1000-0.7 MG/100ML-% IV SOLN
1000.0000 mg | INTRAVENOUS | Status: AC
  Administered 2021-11-24: 1000 mg via INTRAVENOUS

## 2021-11-24 MED ORDER — ACETAMINOPHEN 10 MG/ML IV SOLN: 1000.0000 mg | Freq: Once | INTRAVENOUS | Status: DC | PRN

## 2021-11-24 MED ORDER — ONDANSETRON HCL 4 MG/2ML IJ SOLN
INTRAMUSCULAR | Status: DC | PRN
  Administered 2021-11-24: 4 mg via INTRAVENOUS

## 2021-11-24 MED ORDER — PHENYLEPHRINE HCL (PRESSORS) 10 MG/ML IV SOLN
INTRAVENOUS | Status: DC | PRN
  Administered 2021-11-24 (×2): 100 ug via INTRAVENOUS

## 2021-11-24 MED ORDER — MENTHOL 3 MG MT LOZG
1.0000 | LOZENGE | OROMUCOSAL | Status: DC | PRN
  Filled 2021-11-24: qty 9

## 2021-11-24 MED ORDER — MORPHINE SULFATE (PF) 2 MG/ML IV SOLN
0.5000 mg | INTRAVENOUS | Status: DC | PRN
  Administered 2021-11-24 – 2021-11-25 (×2): 1 mg via INTRAVENOUS
  Filled 2021-11-24 (×2): qty 1

## 2021-11-24 MED ORDER — METOCLOPRAMIDE HCL 10 MG PO TABS: 5.0000 mg | ORAL_TABLET | Freq: Three times a day (TID) | ORAL | Status: DC | PRN

## 2021-11-24 MED ORDER — BUPIVACAINE-EPINEPHRINE (PF) 0.5% -1:200000 IJ SOLN
INTRAMUSCULAR | Status: AC
  Filled 2021-11-24: qty 30

## 2021-11-24 MED ORDER — PROPOFOL 1000 MG/100ML IV EMUL
INTRAVENOUS | Status: AC
Start: 2021-11-24 — End: ?
  Filled 2021-11-24: qty 100

## 2021-11-24 MED ORDER — ONDANSETRON HCL 4 MG/2ML IJ SOLN
4.0000 mg | Freq: Four times a day (QID) | INTRAMUSCULAR | Status: DC | PRN
  Administered 2021-11-26: 4 mg via INTRAVENOUS
  Filled 2021-11-24: qty 2

## 2021-11-24 MED ORDER — METHOCARBAMOL 500 MG PO TABS: 500.0000 mg | ORAL_TABLET | Freq: Four times a day (QID) | ORAL | Status: DC | PRN

## 2021-11-24 MED ORDER — ONDANSETRON HCL 4 MG/2ML IJ SOLN: 4.0000 mg | Freq: Once | INTRAMUSCULAR | Status: DC | PRN

## 2021-11-24 MED ORDER — SODIUM CHLORIDE FLUSH 0.9 % IV SOLN
INTRAVENOUS | Status: AC
  Filled 2021-11-24: qty 40

## 2021-11-24 MED ORDER — ACETAMINOPHEN 10 MG/ML IV SOLN
INTRAVENOUS | Status: DC | PRN
  Administered 2021-11-24: 1000 mg via INTRAVENOUS

## 2021-11-24 MED ORDER — CHLORHEXIDINE GLUCONATE 0.12 % MT SOLN: 15.0000 mL | Freq: Once | OROMUCOSAL | Status: AC

## 2021-11-24 MED ORDER — PRONTOSAN WOUND IRRIGATION OPTIME
TOPICAL | Status: DC | PRN
  Administered 2021-11-24: 1 via TOPICAL

## 2021-11-24 MED ORDER — ASPIRIN 81 MG PO CHEW
81.0000 mg | CHEWABLE_TABLET | Freq: Two times a day (BID) | ORAL | Status: DC
  Administered 2021-11-24 – 2021-11-26 (×4): 81 mg via ORAL
  Filled 2021-11-24 (×4): qty 1

## 2021-11-24 MED ORDER — FENTANYL CITRATE (PF) 100 MCG/2ML IJ SOLN: 25.0000 ug | INTRAMUSCULAR | Status: DC | PRN

## 2021-11-24 MED ORDER — HYDROCODONE-ACETAMINOPHEN 7.5-325 MG PO TABS
1.0000 | ORAL_TABLET | ORAL | Status: DC | PRN
  Administered 2021-11-24 – 2021-11-25 (×2): 2 via ORAL
  Filled 2021-11-24 (×2): qty 2

## 2021-11-24 MED ORDER — ORAL CARE MOUTH RINSE: 15.0000 mL | Freq: Once | OROMUCOSAL | Status: AC

## 2021-11-24 MED ORDER — FLUOXETINE HCL 20 MG PO CAPS
20.0000 mg | ORAL_CAPSULE | Freq: Every day | ORAL | Status: DC
Start: 2021-11-24 — End: 2021-11-26
  Administered 2021-11-24 – 2021-11-26 (×3): 20 mg via ORAL
  Filled 2021-11-24 (×3): qty 1

## 2021-11-24 MED ORDER — PHENOL 1.4 % MT LIQD
1.0000 | OROMUCOSAL | Status: DC | PRN
  Filled 2021-11-24: qty 177

## 2021-11-24 MED ORDER — BUPIVACAINE-EPINEPHRINE (PF) 0.5% -1:200000 IJ SOLN
INTRAMUSCULAR | Status: DC | PRN
  Administered 2021-11-24: 30 mL via PERINEURAL

## 2021-11-24 MED ORDER — CEFAZOLIN SODIUM-DEXTROSE 1-4 GM/50ML-% IV SOLN
1.0000 g | Freq: Four times a day (QID) | INTRAVENOUS | Status: AC
  Administered 2021-11-24 (×2): 1 g via INTRAVENOUS
  Filled 2021-11-24 (×2): qty 50

## 2021-11-24 MED ORDER — MIDAZOLAM HCL 2 MG/2ML IJ SOLN
INTRAMUSCULAR | Status: AC
  Filled 2021-11-24: qty 2

## 2021-11-24 MED ORDER — DIPHENHYDRAMINE HCL 12.5 MG/5ML PO ELIX: 12.5000 mg | ORAL_SOLUTION | ORAL | Status: DC | PRN

## 2021-11-24 MED ORDER — ONDANSETRON HCL 4 MG PO TABS: 4.0000 mg | ORAL_TABLET | Freq: Four times a day (QID) | ORAL | Status: DC | PRN

## 2021-11-24 MED ORDER — BUPIVACAINE HCL (PF) 0.5 % IJ SOLN
INTRAMUSCULAR | Status: DC | PRN
  Administered 2021-11-24: 20 mL
  Administered 2021-11-24: 2.4 mL

## 2021-11-24 MED ORDER — TRANEXAMIC ACID-NACL 1000-0.7 MG/100ML-% IV SOLN
INTRAVENOUS | Status: AC
Start: 2021-11-24 — End: 2021-11-24
  Filled 2021-11-24: qty 100

## 2021-11-24 MED ORDER — PHENYLEPHRINE HCL (PRESSORS) 10 MG/ML IV SOLN
INTRAVENOUS | Status: AC
  Filled 2021-11-24: qty 1

## 2021-11-24 MED ORDER — LACTATED RINGERS IV SOLN
INTRAVENOUS | Status: DC
Start: 2021-11-24 — End: 2021-11-24

## 2021-11-24 MED ORDER — BUPIVACAINE LIPOSOME 1.3 % IJ SUSP
INTRAMUSCULAR | Status: DC | PRN
  Administered 2021-11-24: 60 mL

## 2021-11-24 MED ORDER — ALUM & MAG HYDROXIDE-SIMETH 200-200-20 MG/5ML PO SUSP: 30.0000 mL | ORAL | Status: DC | PRN

## 2021-11-24 MED ORDER — BUPIVACAINE LIPOSOME 1.3 % IJ SUSP
INTRAMUSCULAR | Status: AC
  Filled 2021-11-24: qty 20

## 2021-11-24 MED ORDER — DEXMEDETOMIDINE (PRECEDEX) IN NS 20 MCG/5ML (4 MCG/ML) IV SYRINGE
PREFILLED_SYRINGE | INTRAVENOUS | Status: DC | PRN
  Administered 2021-11-24: 12 ug via INTRAVENOUS

## 2021-11-24 MED ORDER — CEFAZOLIN SODIUM-DEXTROSE 2-4 GM/100ML-% IV SOLN
2.0000 g | INTRAVENOUS | Status: AC
  Administered 2021-11-24: 2 g via INTRAVENOUS

## 2021-11-24 MED ORDER — ACETAMINOPHEN 10 MG/ML IV SOLN
INTRAVENOUS | Status: AC
  Filled 2021-11-24: qty 100

## 2021-11-24 MED ORDER — ACETAMINOPHEN 325 MG PO TABS: 325.0000 mg | ORAL_TABLET | Freq: Four times a day (QID) | ORAL | Status: DC | PRN

## 2021-11-24 MED ORDER — CHLORHEXIDINE GLUCONATE 0.12 % MT SOLN
OROMUCOSAL | Status: AC
  Administered 2021-11-24: 15 mL via OROMUCOSAL
  Filled 2021-11-24: qty 15

## 2021-11-24 MED ORDER — BUPIVACAINE HCL (PF) 0.5 % IJ SOLN
INTRAMUSCULAR | Status: AC
  Filled 2021-11-24: qty 20

## 2021-11-24 MED ORDER — PHENYLEPHRINE HCL-NACL 20-0.9 MG/250ML-% IV SOLN
INTRAVENOUS | Status: DC | PRN
  Administered 2021-11-24: 25 ug/min via INTRAVENOUS

## 2021-11-24 MED ORDER — HYDROCODONE-ACETAMINOPHEN 5-325 MG PO TABS
1.0000 | ORAL_TABLET | ORAL | Status: DC | PRN
  Administered 2021-11-24 – 2021-11-26 (×4): 1 via ORAL
  Filled 2021-11-24 (×5): qty 1

## 2021-11-24 MED ORDER — FENTANYL CITRATE PF 50 MCG/ML IJ SOSY: 50.0000 ug | PREFILLED_SYRINGE | INTRAMUSCULAR | Status: DC | PRN

## 2021-11-24 MED ORDER — METHOCARBAMOL 1000 MG/10ML IJ SOLN
500.0000 mg | Freq: Four times a day (QID) | INTRAVENOUS | Status: DC | PRN
  Filled 2021-11-24: qty 5

## 2021-11-24 MED ORDER — FENTANYL CITRATE (PF) 100 MCG/2ML IJ SOLN
INTRAMUSCULAR | Status: DC | PRN
  Administered 2021-11-24 (×2): 50 ug via INTRAVENOUS

## 2021-11-24 MED ORDER — POVIDONE-IODINE 10 % EX SWAB
2.0000 "application " | Freq: Once | CUTANEOUS | Status: AC
  Administered 2021-11-24: 2 via TOPICAL

## 2021-11-24 MED ORDER — 0.9 % SODIUM CHLORIDE (POUR BTL) OPTIME
TOPICAL | Status: DC | PRN
  Administered 2021-11-24: 1000 mL

## 2021-11-24 MED ORDER — DOCUSATE SODIUM 100 MG PO CAPS
100.0000 mg | ORAL_CAPSULE | Freq: Two times a day (BID) | ORAL | Status: DC
  Administered 2021-11-24 – 2021-11-26 (×5): 100 mg via ORAL
  Filled 2021-11-24 (×5): qty 1

## 2021-11-24 MED ORDER — BISACODYL 10 MG RE SUPP: 10.0000 mg | Freq: Every day | RECTAL | Status: DC | PRN

## 2021-11-24 MED ORDER — METOCLOPRAMIDE HCL 5 MG/ML IJ SOLN: 5.0000 mg | Freq: Three times a day (TID) | INTRAMUSCULAR | Status: DC | PRN

## 2021-11-24 MED ORDER — PANTOPRAZOLE SODIUM 40 MG PO TBEC
80.0000 mg | DELAYED_RELEASE_TABLET | Freq: Every day | ORAL | Status: DC
  Administered 2021-11-24 – 2021-11-26 (×3): 80 mg via ORAL
  Filled 2021-11-24 (×3): qty 2

## 2021-11-24 MED ORDER — KETOROLAC TROMETHAMINE 15 MG/ML IJ SOLN
15.0000 mg | Freq: Four times a day (QID) | INTRAMUSCULAR | Status: AC
  Administered 2021-11-24 – 2021-11-25 (×4): 15 mg via INTRAVENOUS
  Filled 2021-11-24 (×4): qty 1

## 2021-11-24 MED ORDER — CLONIDINE HCL 0.1 MG PO TABS
0.2000 mg | ORAL_TABLET | Freq: Two times a day (BID) | ORAL | Status: DC
  Administered 2021-11-24 – 2021-11-26 (×3): 0.2 mg via ORAL
  Filled 2021-11-24 (×4): qty 2

## 2021-11-24 MED ORDER — LEVOTHYROXINE SODIUM 50 MCG PO TABS
75.0000 ug | ORAL_TABLET | Freq: Every day | ORAL | Status: DC
  Administered 2021-11-24 – 2021-11-26 (×3): 75 ug via ORAL
  Filled 2021-11-24 (×3): qty 1

## 2021-11-24 SURGICAL SUPPLY — 56 items
BASEPLATE TIBIAL RT SZ2 (Knees) ×1 IMPLANT
BLADE SAGITTAL AGGR TOOTH XLG (BLADE) ×2 IMPLANT
BLADE SAW SAG 25X90X1.19 (BLADE) ×2 IMPLANT
BOWL CEMENT MIX W/ADAPTER (MISCELLANEOUS) ×2 IMPLANT
BRUSH SCRUB EZ  4% CHG (MISCELLANEOUS) ×1
BRUSH SCRUB EZ 4% CHG (MISCELLANEOUS) ×1 IMPLANT
CEMENT BONE 40GM (Cement) ×4 IMPLANT
CHLORAPREP W/TINT 26 (MISCELLANEOUS) ×4 IMPLANT
COMP PATELLA GENESIS 29 OVAL (Orthopedic Implant) ×2 IMPLANT
COMPONENT FEM OXIN RT NRRW SZ3 (Orthopedic Implant) ×1 IMPLANT
COMPONENT PTLLA GENS 29 OVAL (Orthopedic Implant) IMPLANT
COOLER POLAR GLACIER W/PUMP (MISCELLANEOUS) ×2 IMPLANT
CUFF TOURN SGL QUICK 34 (TOURNIQUET CUFF) ×1
CUFF TRNQT CYL 34X4.125X (TOURNIQUET CUFF) ×1 IMPLANT
DRAPE 3/4 80X56 (DRAPES) ×4 IMPLANT
DRAPE INCISE IOBAN 66X60 STRL (DRAPES) ×2 IMPLANT
ELECT REM PT RETURN 9FT ADLT (ELECTROSURGICAL) ×2
ELECTRODE REM PT RTRN 9FT ADLT (ELECTROSURGICAL) ×1 IMPLANT
GAUZE SPONGE 4X4 12PLY STRL (GAUZE/BANDAGES/DRESSINGS) ×2 IMPLANT
GAUZE XEROFORM 1X8 LF (GAUZE/BANDAGES/DRESSINGS) ×2 IMPLANT
GLOVE SURG ENC MOIS LTX SZ8 (GLOVE) ×2 IMPLANT
GLOVE SURG ORTHO LTX SZ8.5 (GLOVE) ×2 IMPLANT
GLOVE SURG UNDER POLY LF SZ8.5 (GLOVE) ×4 IMPLANT
GOWN STRL REUS W/ TWL LRG LVL3 (GOWN DISPOSABLE) ×1 IMPLANT
GOWN STRL REUS W/ TWL XL LVL3 (GOWN DISPOSABLE) ×1 IMPLANT
GOWN STRL REUS W/TWL LRG LVL3 (GOWN DISPOSABLE) ×1
GOWN STRL REUS W/TWL XL LVL3 (GOWN DISPOSABLE) ×1
INSERT TIB XLPE 9 SZ1-2 (Insert) ×1 IMPLANT
IV NS IRRIG 3000ML ARTHROMATIC (IV SOLUTION) ×2 IMPLANT
KIT TURNOVER KIT A (KITS) ×2 IMPLANT
MANIFOLD NEPTUNE II (INSTRUMENTS) ×2 IMPLANT
MAT ABSORB  FLUID 56X50 GRAY (MISCELLANEOUS) ×1
MAT ABSORB FLUID 56X50 GRAY (MISCELLANEOUS) ×1 IMPLANT
NDL SAFETY ECLIPSE 18X1.5 (NEEDLE) ×1 IMPLANT
NDL SPNL 20GX3.5 QUINCKE YW (NEEDLE) ×1 IMPLANT
NEEDLE HYPO 18GX1.5 SHARP (NEEDLE) ×1
NEEDLE SPNL 20GX3.5 QUINCKE YW (NEEDLE) ×2 IMPLANT
NS IRRIG 1000ML POUR BTL (IV SOLUTION) ×2 IMPLANT
PACK TOTAL KNEE (MISCELLANEOUS) ×2 IMPLANT
PAD DE MAYO PRESSURE PROTECT (MISCELLANEOUS) ×2 IMPLANT
PAD WRAPON POLAR KNEE (MISCELLANEOUS) ×1 IMPLANT
PULSAVAC PLUS IRRIG FAN TIP (DISPOSABLE) ×2
SOLUTION PRONTOSAN WOUND 350ML (IRRIGATION / IRRIGATOR) ×2 IMPLANT
STAPLER SKIN PROX 35W (STAPLE) ×2 IMPLANT
SUCTION FRAZIER HANDLE 10FR (MISCELLANEOUS) ×1
SUCTION TUBE FRAZIER 10FR DISP (MISCELLANEOUS) ×1 IMPLANT
SUT DVC 2 QUILL PDO  T11 36X36 (SUTURE) ×1
SUT DVC 2 QUILL PDO T11 36X36 (SUTURE) ×1 IMPLANT
SUT VIC AB 2-0 CT1 18 (SUTURE) ×2 IMPLANT
SUT VIC AB 2-0 CT1 27 (SUTURE)
SUT VIC AB 2-0 CT1 TAPERPNT 27 (SUTURE) IMPLANT
SUT VIC AB PLUS 45CM 1-MO-4 (SUTURE) ×2 IMPLANT
SYR 30ML LL (SYRINGE) ×6 IMPLANT
TIP FAN IRRIG PULSAVAC PLUS (DISPOSABLE) ×1 IMPLANT
WATER STERILE IRR 500ML POUR (IV SOLUTION) ×2 IMPLANT
WRAPON POLAR PAD KNEE (MISCELLANEOUS) ×2

## 2021-11-24 NOTE — Transfer of Care (Signed)
Immediate Anesthesia Transfer of Care Note ? ?Patient: Kristina Reed ? ?Procedure(s) Performed: TOTAL KNEE ARTHROPLASTY (Right: Knee) ? ?Patient Location: PACU ? ?Anesthesia Type:Spinal ? ?Level of Consciousness: drowsy ? ?Airway & Oxygen Therapy: Patient Spontanous Breathing and Patient connected to face mask oxygen ? ?Post-op Assessment: Report given to RN and Post -op Vital signs reviewed and stable ? ?Post vital signs: Reviewed and stable ? ?Last Vitals:  ?Vitals Value Taken Time  ?BP 105/68   ?Temp    ?Pulse 88 11/24/21 1138  ?Resp 10 11/24/21 1138  ?SpO2 99 % 11/24/21 1138  ?Vitals shown include unvalidated device data. ? ?Last Pain:  ?Vitals:  ? 11/24/21 0859  ?TempSrc: Temporal  ?PainSc: 0-No pain  ?   ? ?  ? ?Complications: No notable events documented. ?

## 2021-11-24 NOTE — Anesthesia Procedure Notes (Signed)
Anesthesia Regional Block: Adductor canal block  ? ?Pre-Anesthetic Checklist: , timeout performed,  Correct Patient, Correct Site, Correct Laterality,  Correct Procedure,, site marked,  Risks and benefits discussed,  Surgical consent,  Pre-op evaluation,  At surgeon's request and post-op pain management ? ?Laterality: Right ? ?Prep: chloraprep     ?  ?Needles:  ?Injection technique: Single-shot ? ?Needle Type: Echogenic Needle   ? ? ? ? ? ? ? ?Additional Needles: ? ? ?Procedures:,,,, ultrasound used (permanent image in chart),,   ?Motor weakness within 20 minutes.  ?Narrative:  ?Start time: 11/24/2021 9:40 AM ?End time: 11/24/2021 9:41 AM ?Injection made incrementally with aspirations every 5 mL. ? ?Performed by: Personally  ?Anesthesiologist: Darrin Nipper, MD ? ?Additional Notes: ?Functioning IV was confirmed and monitors applied.  Sterile prep and drape, hand hygiene and sterile gloves were used. Ultrasound guidance: relevant anatomy identified, needle position confirmed, local anesthetic spread visualized around nerve(s), vascular puncture avoided.  Image saved to electronic medical record.  Negative aspiration prior to incremental administration of local anesthetic for total 20 ml bupivacaine 0.5% given in adductor saphenous distribution. The patient tolerated the procedure well. Vital signs and moderate sedation medications recorded in RN notes.  ? ? ? ?

## 2021-11-24 NOTE — Anesthesia Preprocedure Evaluation (Addendum)
Anesthesia Evaluation  ?Patient identified by MRN, date of birth, ID band ?Patient awake ? ? ? ?Reviewed: ?Allergy & Precautions, NPO status , Patient's Chart, lab work & pertinent test results ? ?History of Anesthesia Complications ?Negative for: history of anesthetic complications ? ?Airway ?Mallampati: I ? ? ?Neck ROM: Full ? ? ? Dental ? ?(+) Edentulous Upper, Edentulous Lower ?  ?Pulmonary ?sleep apnea ,  ?  ?Pulmonary exam normal ?breath sounds clear to auscultation ? ? ? ? ? ? Cardiovascular ?hypertension, Normal cardiovascular exam ?Rhythm:Regular Rate:Normal ? ?ECG 10/17/21: normal ?  ?Neuro/Psych ? Headaches, PSYCHIATRIC DISORDERS Anxiety Depression   ? GI/Hepatic ?GERD  ,  ?Endo/Other  ?Hypothyroidism Obesity  ? Renal/GU ?negative Renal ROS  ? ?  ?Musculoskeletal ? ?(+) Arthritis ,  ? Abdominal ?  ?Peds ? Hematology ?negative hematology ROS ?(+)   ?Anesthesia Other Findings ? ? Reproductive/Obstetrics ?Cervical CA ? ?  ? ? ? ? ? ? ? ? ? ? ? ? ? ?  ?  ? ? ? ? ? ? ? ?Anesthesia Physical ?Anesthesia Plan ? ?ASA: 2 ? ?Anesthesia Plan: General, Spinal and Regional  ? ?Post-op Pain Management: Regional block*  ? ?Induction: Intravenous ? ?PONV Risk Score and Plan: 3 and Propofol infusion, TIVA, Treatment may vary due to age or medical condition and Ondansetron ? ?Airway Management Planned: Natural Airway and Nasal Cannula ? ?Additional Equipment:  ? ?Intra-op Plan:  ? ?Post-operative Plan:  ? ?Informed Consent: I have reviewed the patients History and Physical, chart, labs and discussed the procedure including the risks, benefits and alternatives for the proposed anesthesia with the patient or authorized representative who has indicated his/her understanding and acceptance.  ? ? ? ? ? ?Plan Discussed with: CRNA ? ?Anesthesia Plan Comments: (Plan for preoperative adductor saphenous nerve block, spinal and GA with natural airway, LMA/GETA backup.  Patient consented for risks of  anesthesia including but not limited to:  ?- adverse reactions to medications ?- damage to eyes, teeth, lips or other oral mucosa ?- nerve damage due to positioning  ?- sore throat or hoarseness ?- headache, bleeding, infection, nerve damage 2/2 spinal ?- damage to heart, brain, nerves, lungs, other parts of body or loss of life ? ?Informed patient about role of CRNA in peri- and intra-operative care.  Patient voiced understanding.)  ? ? ? ? ? ? ?Anesthesia Quick Evaluation ? ?

## 2021-11-24 NOTE — Progress Notes (Signed)
PT Cancellation Note ? ?Patient Details ?Name: Kristina Reed ?MRN: [id number] ?DOB: 07-Aug-1966 ? ? ?Cancelled Treatment:    Reason Eval/Treat Not Completed: Patient not medically ready (Eval attempted- pt able to move ankle, has sensation at foot. Pt has no sensation at medial calf and medial knee. WIll defer to evaluation to later date/time once pt is ready to begin PT.) ? ?3:55 PM, 11/24/21 ?[name], PT, DPT ?Physical Therapist - [place] ?[facility]  ?[phone] (ASCOM)  ? ? ? ?[name] ?11/24/2021, 3:55 PM ?

## 2021-11-24 NOTE — H&P (Signed)
The patient has been re-examined, and the chart reviewed, and there have been no interval changes to the documented history and physical.  Plan a right total knee today.  Anesthesia is consulted regarding a peripheral nerve block for post-operative pain.  The risks, benefits, and alternatives have been discussed at length, and the patient is willing to proceed.     

## 2021-11-24 NOTE — Op Note (Signed)
DATE OF SURGERY:  11/24/2021 ?TIME: 11:37 AM ? ?PATIENT NAME:  Kristina Reed   ?AGE: 56 y.o.  ? ? ?PRE-OPERATIVE DIAGNOSIS:  M17.11 Unilateral primary osteoarthritis, right knee ? ?POST-OPERATIVE DIAGNOSIS:  Same ? ?PROCEDURE:  Procedure(s): ?TOTAL KNEE ARTHROPLASTY, RIGHT  ? ?SURGEON:  Lovell Sheehan, MD  ? ?ASSISTANT:  Carlynn Spry,  PA-C ? ?OPERATIVE IMPLANTS: [employer], Cruciate Retaining Oxinium Femoral component size 3 narrow, Fixed Bearing Tray size 2, Patella polyethylene 3-peg oval button size 29 mm, with a 9 mm DISHED insert. ? ? ?PREOPERATIVE INDICATIONS: ? ?Kristina Reed is a 56 y.o. female who has a diagnosis of M17.11 Unilateral primary osteoarthritis, right knee and elected for a total knee arthroplasty after failing nonoperative treatment, including activity modification, pain medication, physical therapy and injections who has significant impairment of their activities of daily living.  Radiographs have demonstrated tricompartmental osteoarthritis joint space narrowing, osteophytes, subchondral sclerosis and cyst formation. ? ?The risks, benefits, and alternatives were discussed at length including but not limited to the risks of infection, bleeding, nerve or blood vessel injury, knee stiffness, fracture, dislocation, loosening or failure of the hardware and the need for further surgery. Medical risks include but not limited to DVT and pulmonary embolism, myocardial infarction, stroke, pneumonia, respiratory failure and death. I discussed these risks with the patient in my office prior to the date of surgery. They understood these risks and were willing to proceed. ? ?OPERATIVE FINDINGS AND UNIQUE ASPECTS OF THE CASE:  All three compartments with advanced and severe degenerative changes, large osteophytes and an abundance of synovial fluid. Significant deformity was also noted. A decision was made to proceed with total knee arthroplasty. ? ? ?OPERATIVE DESCRIPTION: ? ?The patient was brought to  the operative room and placed in a supine position after undergoing placement of a general anesthetic. IV antibiotics were given. Patient received tranexamic acid. The lower extremity was prepped and draped in the usual sterile fashion.  A time out was performed to verify the patient's name, date of birth, medical record number, correct site of surgery and correct procedure to be performed. The timeout was also used to confirm the patient received antibiotics and that appropriate instruments, implants and radiographs studies were available in the room.  The leg was elevated and exsanguinated with an Esmarch and the tourniquet was inflated to 250 mmHg. ? ?A midline incision was made over the left knee.. A medial parapatellar arthrotomy was then made and the patella subluxed laterally and the knee was brought into 90? of flexion. Hoffa's fat pad along with the anterior cruciate ligament was resected and the medial joint line was exposed. ? ?Attention was then turned to preparation of the patella. The thickness of the patella was measured with a caliper, the diameter measured with the patella templates.  The patella resection was then made with an oscillating saw using the patella cutting guide.  The 29 mm button fit appropriately.  3 peg holes for the patella component were then drilled. ? ?The extramedullary tibial cutting guide was then placed using the anterior tibial crest and second ray of the foot as a reference.  The tibial cutting guide was adjusted to allow for appropriate posterior slope.  The tibial cutting block was pinned into position. The slotted stylus was used to measure the proximal tibial resection of 9 mm off the high lateral side. Care was taken during the tibial resection to protect the medial and collateral ligaments.  The resected tibial bone was  removed. ? ?The distal femur was resected using the intramedullary cutting guide.  Care was taken to protect the collateral ligaments during distal  femoral resection.  The distal femoral resection was performed with an oscillating saw. The femoral cutting guide was then removed. Extension gap was measured with a 9 mm spacer block and alignment and extension was confirmed using a long alignment rod. The femur was sized to be a 3 narrow. Rotation of the referencing guide was checked with the epicondylar axis and Whitesides line. Then the 4-in-1 cutting jig was then applied to the distal femur. A stylus was used to confirm that the anterior femur would not be notched.   Then the anterior, posterior and chamfer femoral cuts were then made with an oscillating saw.  The knee was distracted and all posterior osteophytes were removed.  The flexion gap was then measured with a flexion spacer block and long alignment rod and was found to be symmetric with the extension gap and perpendicular to mechanical axis of the tibia. ? ?The proximal tibia plateau was then sized with trial trays. The best coverage was achieved with a size 2. This tibial tray was then pinned into position. The proximal tibia was then prepared with the keel punch.  After tibial preparation was completed, all trial components were inserted with polyethylene trials. The knee achieved full extension and flexed to 120 degrees. Ligament were stable to varus and valgus at full extension as well as 30, 60 and 90 degrees of flexion.  ? ?The trials were then placed. Knee was taken through a full range of motion and deemed to be stable with the trial components. All trial components were then removed.  The joint was copiously irrigated with pulse lavage. ? ?The final total knee arthroplasty components were then cemented into place. The knee was held in extension while cement was allowed to cure.The knee was taken through a range of motion and the patella tracked well and the knee was again irrigated copiously.  The knee capsule was then injected with Exparel. ? ?The medial arthrotomy was closed with #1 Vicryl  and #2 Quill. The subcutaneous tissue closed with  2-0 vicryl, and skin approximated with staples.  A dry sterile and compressive dressing was applied.  A Polar Care was applied to the operative knee. ? ?The patient was awakened and brought to the PACU in stable and satisfactory condition.  All sharp, lap and instrument counts were correct at the conclusion the case. I spoke with the patient's family in the postop consultation room to let them know the case had been performed without complication and the patient was stable in recovery room.  ? ?Total tourniquet time was 43 minutes. ? ?

## 2021-11-24 NOTE — Anesthesia Postprocedure Evaluation (Signed)
Anesthesia Post Note ? ?Patient: Kristina Reed ? ?Procedure(s) Performed: TOTAL KNEE ARTHROPLASTY (Right: Knee) ? ?Patient location during evaluation: PACU ?Anesthesia Type: Regional, General and Spinal ?Level of consciousness: awake and alert, oriented and patient cooperative ?Pain management: pain level controlled ?Vital Signs Assessment: post-procedure vital signs reviewed and stable ?Respiratory status: spontaneous breathing, nonlabored ventilation and respiratory function stable ?Cardiovascular status: blood pressure returned to baseline and stable ?Postop Assessment: adequate PO intake, no headache and spinal receding ?Anesthetic complications: no ? ? ?No notable events documented. ? ? ?Last Vitals:  ?Vitals:  ? 11/24/21 1215 11/24/21 1253  ?BP: 108/68 95/76  ?Pulse: 93 78  ?Resp: 16 16  ?Temp:    ?SpO2: 100% 99%  ?  ?Last Pain:  ?Vitals:  ? 11/24/21 1253  ?TempSrc:   ?PainSc: 0-No pain  ? ? ?  ?  ?  ?  ?  ?  ? ?Darrin Nipper ? ? ? ? ?

## 2021-11-25 ENCOUNTER — Encounter: Payer: Self-pay | Admitting: Orthopedic Surgery

## 2021-11-25 DIAGNOSIS — Z791 Long term (current) use of non-steroidal anti-inflammatories (NSAID): Secondary | ICD-10-CM | POA: Diagnosis not present

## 2021-11-25 DIAGNOSIS — Z96651 Presence of right artificial knee joint: Secondary | ICD-10-CM | POA: Diagnosis not present

## 2021-11-25 DIAGNOSIS — Z885 Allergy status to narcotic agent status: Secondary | ICD-10-CM | POA: Diagnosis not present

## 2021-11-25 DIAGNOSIS — K2210 Ulcer of esophagus without bleeding: Secondary | ICD-10-CM | POA: Diagnosis not present

## 2021-11-25 DIAGNOSIS — Z803 Family history of malignant neoplasm of breast: Secondary | ICD-10-CM | POA: Diagnosis not present

## 2021-11-25 DIAGNOSIS — Z96659 Presence of unspecified artificial knee joint: Secondary | ICD-10-CM

## 2021-11-25 DIAGNOSIS — M1711 Unilateral primary osteoarthritis, right knee: Secondary | ICD-10-CM | POA: Diagnosis not present

## 2021-11-25 DIAGNOSIS — I10 Essential (primary) hypertension: Secondary | ICD-10-CM | POA: Diagnosis not present

## 2021-11-25 DIAGNOSIS — K3189 Other diseases of stomach and duodenum: Secondary | ICD-10-CM | POA: Diagnosis not present

## 2021-11-25 DIAGNOSIS — E669 Obesity, unspecified: Secondary | ICD-10-CM | POA: Diagnosis not present

## 2021-11-25 DIAGNOSIS — K259 Gastric ulcer, unspecified as acute or chronic, without hemorrhage or perforation: Secondary | ICD-10-CM | POA: Diagnosis not present

## 2021-11-25 DIAGNOSIS — R339 Retention of urine, unspecified: Secondary | ICD-10-CM | POA: Diagnosis not present

## 2021-11-25 DIAGNOSIS — E039 Hypothyroidism, unspecified: Secondary | ICD-10-CM | POA: Diagnosis not present

## 2021-11-25 DIAGNOSIS — Z888 Allergy status to other drugs, medicaments and biological substances status: Secondary | ICD-10-CM | POA: Diagnosis not present

## 2021-11-25 DIAGNOSIS — G894 Chronic pain syndrome: Secondary | ICD-10-CM | POA: Diagnosis present

## 2021-11-25 DIAGNOSIS — Z6832 Body mass index (BMI) 32.0-32.9, adult: Secondary | ICD-10-CM | POA: Diagnosis not present

## 2021-11-25 DIAGNOSIS — Z79890 Hormone replacement therapy (postmenopausal): Secondary | ICD-10-CM | POA: Diagnosis not present

## 2021-11-25 DIAGNOSIS — G4730 Sleep apnea, unspecified: Secondary | ICD-10-CM | POA: Diagnosis not present

## 2021-11-25 DIAGNOSIS — M25761 Osteophyte, right knee: Secondary | ICD-10-CM | POA: Diagnosis present

## 2021-11-25 DIAGNOSIS — K219 Gastro-esophageal reflux disease without esophagitis: Secondary | ICD-10-CM | POA: Diagnosis not present

## 2021-11-25 DIAGNOSIS — Z90711 Acquired absence of uterus with remaining cervical stump: Secondary | ICD-10-CM | POA: Diagnosis not present

## 2021-11-25 DIAGNOSIS — Z79899 Other long term (current) drug therapy: Secondary | ICD-10-CM | POA: Diagnosis not present

## 2021-11-25 DIAGNOSIS — Z8541 Personal history of malignant neoplasm of cervix uteri: Secondary | ICD-10-CM | POA: Diagnosis not present

## 2021-11-25 LAB — CBC
HCT: 33.2 % — ABNORMAL LOW (ref 36.0–46.0)
Hemoglobin: 10.7 g/dL — ABNORMAL LOW (ref 12.0–15.0)
MCH: 28.2 pg (ref 26.0–34.0)
MCHC: 32.2 g/dL (ref 30.0–36.0)
MCV: 87.4 fL (ref 80.0–100.0)
Platelets: 192 10*3/uL (ref 150–400)
RBC: 3.8 MIL/uL — ABNORMAL LOW (ref 3.87–5.11)
RDW: 13.2 % (ref 11.5–15.5)
WBC: 10.8 10*3/uL — ABNORMAL HIGH (ref 4.0–10.5)
nRBC: 0 % (ref 0.0–0.2)

## 2021-11-25 LAB — BASIC METABOLIC PANEL
Anion gap: 5 (ref 5–15)
BUN: 10 mg/dL (ref 6–20)
CO2: 26 mmol/L (ref 22–32)
Calcium: 8.2 mg/dL — ABNORMAL LOW (ref 8.9–10.3)
Chloride: 102 mmol/L (ref 98–111)
Creatinine, Ser: 0.82 mg/dL (ref 0.44–1.00)
GFR, Estimated: >60 mL/min (ref >60)
Glucose, Bld: 162 mg/dL — ABNORMAL HIGH (ref 70–99)
Potassium: 4.2 mmol/L (ref 3.5–5.1)
Sodium: 133 mmol/L — ABNORMAL LOW (ref 135–145)

## 2021-11-25 MED ORDER — METHOCARBAMOL 500 MG PO TABS
500.0000 mg | ORAL_TABLET | Freq: Four times a day (QID) | ORAL | 0 refills | Status: DC | PRN
Start: 2021-11-25 — End: 2021-12-24

## 2021-11-25 MED ORDER — DOCUSATE SODIUM 100 MG PO CAPS: 100.0000 mg | ORAL_CAPSULE | Freq: Two times a day (BID) | ORAL | 0 refills | Status: DC

## 2021-11-25 MED ORDER — HYDROCODONE-ACETAMINOPHEN 5-325 MG PO TABS: 1.0000 | ORAL_TABLET | ORAL | 0 refills | Status: DC | PRN

## 2021-11-25 MED ORDER — ASPIRIN 81 MG PO CHEW: 81.0000 mg | CHEWABLE_TABLET | Freq: Two times a day (BID) | ORAL | 0 refills | Status: DC

## 2021-11-25 NOTE — Evaluation (Signed)
Occupational Therapy Evaluation ?Patient Details ?Name: Kristina Reed ?MRN: [id number] ?DOB: [birth year] ?Today's Date: 11/25/2021 ? ? ?History of Present Illness Pt is a 56 y.o. 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  PMH includes sleep apnea, htn, HA's, anxiety, hypothyroidism, and arthritis.  ? ?Clinical Impression ?  ?Pt seen for OT evaluation this date, POD#1 from above surgery. Upon arrival to room, pt awake and seated upright in bed. Pt reporting 9/10 pain at rest however agreeable and motivated to participate in OT eval/tx. Pt was independent in all ADLs prior to surgery. Pt currently requires MIN GUARD for stand pivot transfers to/from [facility] and MOD assist for LB dressing while in seated position due to pain and limited AROM of R knee. Pt requires only SET-UP assist for seated UB ADLs. Pt instructed in polar care mgt, falls prevention strategies, home/routines modifications, DME/AE for LB bathing and dressing tasks, and compression stocking mgt; handout provided and pt verbalized understanding. Pt would benefit from skilled OT services including additional instruction in techniques, with or without assistive devices, for dressing and bathing skills to support recall and carryover prior to discharge and ultimately to maximize safety, independence, and minimize falls risk and caregiver burden. Upon discharge, recommend HHOT.   ? ?Recommendations for follow up therapy are one component of a multi-disciplinary discharge planning process, led by the attending physician.  Recommendations may be updated based on patient status, additional functional criteria and insurance authorization.  ? ?Follow Up Recommendations ? Home health OT  ?  ?Assistance Recommended at Discharge Frequent or constant Supervision/Assistance  ?Patient can return home with the following A little help with walking and/or transfers;A lot of help with bathing/dressing/bathroom;Assistance with cooking/housework ? ?  ?Functional  Status Assessment ? Patient has had a recent decline in their functional status and demonstrates the ability to make significant improvements in function in a reasonable and predictable amount of time.  ?Equipment Recommendations ? BSC/3in1  ?  ?   ?Precautions / Restrictions Precautions ?Precautions: Fall;Knee ?Precaution Booklet Issued: Yes (comment) ?Restrictions ?Weight Bearing Restrictions: Yes ?RLE Weight Bearing: Weight bearing as tolerated  ? ?  ? ?Mobility Bed Mobility ?  ?  ?  ?  ?  ?  ?  ?General bed mobility comments: not asssessed, in recliner pre/post session ?  ? ?Transfers ?Overall transfer level: Needs assistance ?Equipment used: Rolling walker (2 wheels) ?Transfers: Sit to/from Stand ?Sit to Stand: Min guard ?  ?  ?  ?  ?  ?General transfer comment: Requires verbal cues for UE/LE placement. No physical assistance provided ?  ? ?  ?Balance Overall balance assessment: Needs assistance ?Sitting-balance support: No upper extremity supported, Feet supported ?Sitting balance-Leahy Scale: Good ?Sitting balance - Comments: steady sitting reaching within BOS ?  ?Standing balance support: Bilateral upper extremity supported, During functional activity ?Standing balance-Leahy Scale: Fair ?  ?  ?  ?  ?  ?  ?  ?  ?  ?  ?  ?  ?   ? ?ADL either performed or assessed with clinical judgement  ? ?ADL Overall ADL's : Needs assistance/impaired ?  ?  ?  ?  ?  ?  ?  ?  ?Upper Body Dressing : Set up;Sitting ?Upper Body Dressing Details (indicate cue type and reason): to don/doff hospital gown ?Lower Body Dressing: Moderate assistance;Sitting/lateral leans ?Lower Body Dressing Details (indicate cue type and reason): pt able to don/doff L sock, but requires assistance to don/doff R sock ?Toilet Transfer: Min  guard;Stand-pivot;BSC/3in1;Rolling walker (2 wheels) ?  ?  ?  ?  ?  ?  ?   ? ? ? ?Vision Baseline Vision/History: 1 Wears glasses ?Ability to See in Adequate Light: 0 Adequate ?Patient Visual Report: No change from  baseline ?   ?   ?   ?   ? ?Pertinent Vitals/Pain Pain Assessment ?Pain Assessment: 0-10 ?Pain Score: 10-Worst pain ever ?Pain Location: R knee ?Pain Descriptors / Indicators: Aching, Sore, Tender ?Pain Intervention(s): Monitored during session, Repositioned, Patient requesting pain meds-RN notified  ? ? ? ?   ?Extremity/Trunk Assessment Upper Extremity Assessment ?Upper Extremity Assessment: Overall WFL for tasks assessed ?  ?Lower Extremity Assessment ?Lower Extremity Assessment: RLE deficits/detail;Defer to PT evaluation ?RLE Deficits / Details: s/p TKA ?RLE: Unable to fully assess due to pain ?  ?Cervical / Trunk Assessment ?Cervical / Trunk Assessment: Normal ?  ?Communication Communication ?Communication: No difficulties ?  ?Cognition Arousal/Alertness: Awake/alert ?Behavior During Therapy: Anxious ?Overall Cognitive Status: Within Functional Limits for tasks assessed ?  ?  ?  ?  ?  ?  ?  ?  ?  ?  ?  ?  ?  ?  ?  ?  ?  ?  ?  ?   ?Exercises Other Exercises ?Other Exercises: Pt instructed in polar care mgt, falls prevention strategies, home/routines modifications, DME/AE for LB bathing and dressing tasks, and compression stocking mgt; handout provided and pt verbalized understanding. ?  ?   ? ? ?Home Living Family/patient expects to be discharged to:: Private residence ?Living Arrangements: Spouse/significant other (pt's boyfriend) ?Available Help at Discharge: Friend(s);Available PRN/intermittently (pt's boyfriend works) ?Type of Home: House ?Home Access: Stairs to enter ?Entrance Stairs-Number of Steps: 5 ?Entrance Stairs-Rails: Right;Left ?Home Layout: One level ?  ?  ?Bathroom Shower/Tub: Tub/shower unit ?  ?Bathroom Toilet: Standard (with toilet riser) ?  ?  ?Home Equipment: Animator (2 wheels);Rollator (4 wheels);Cane - single point ?  ?  ?  ? ?  ?Prior Functioning/Environment Prior Level of Function : Independent/Modified Independent ?  ?  ?  ?  ?  ?  ?Mobility Comments: Modified  independent ambulating with RW; no falls in past 6 months ?ADLs Comments: Independent with ADLs ?  ? ?  ?  ?OT Problem List: Decreased range of motion;Decreased activity tolerance;Impaired balance (sitting and/or standing);Pain ?  ?   ?OT Treatment/Interventions: Self-care/ADL training;Therapeutic exercise;DME and/or AE instruction;Therapeutic activities;Patient/family education;Balance training  ?  ?OT Goals(Current goals can be found in the care plan section) Acute Rehab OT Goals ?Patient Stated Goal: to have less pain ?OT Goal Formulation: With patient ?Time For Goal Achievement: 12/09/21 ?Potential to Achieve Goals: Good ?ADL Goals ?Pt Will Perform Lower Body Dressing: with supervision;with adaptive equipment;sit to/from stand ?Pt Will Transfer to Toilet: with supervision;ambulating;bedside commode ?Pt Will Perform Toileting - Clothing Manipulation and hygiene: with supervision;sit to/from stand  ?OT Frequency: Min 2X/week ?  ? ?   ?AM-PAC OT "6 Clicks" Daily Activity     ?Outcome Measure Help from another person eating meals?: None ?Help from another person taking care of personal grooming?: A Little ?Help from another person toileting, which includes using toliet, bedpan, or urinal?: A Little ?Help from another person bathing (including washing, rinsing, drying)?: A Lot ?Help from another person to put on and taking off regular upper body clothing?: A Little ?Help from another person to put on and taking off regular lower body clothing?: A Lot ?6 Click Score: 17 ?  ?End of Session  Equipment Utilized During Treatment: Rolling walker (2 wheels) ?Nurse Communication: Mobility status;Patient requests pain meds ? ?Activity Tolerance: Patient tolerated treatment well ?Patient left: in chair;with call bell/phone within reach;with chair alarm set ? ?OT Visit Diagnosis: Unsteadiness on feet (R26.81);Pain ?Pain - Right/Left: Right ?Pain - part of body: Knee  ?              ?Time: 8527-7824 ?OT Time Calculation (min): 22  min ?Charges:  OT General Charges ?$OT Visit: 1 Visit ?OT Evaluation ?$OT Eval Moderate Complexity: 1 Mod ?OT Treatments ?$Self Care/Home Management : 8-22 mins ? ?Fredirick Maudlin, OTR/L ?[place] ? ?

## 2021-11-25 NOTE — Progress Notes (Signed)
Physical Therapy Treatment ?Patient Details ?Name: Kristina Reed ?MRN: [id number] ?DOB: 06-12-1966 ?Today's Date: 11/25/2021 ? ? ?History of Present Illness Pt is a 56 y.o. 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  PMH includes sleep apnea, htn, HA's, anxiety, hypothyroidism, and arthritis. ? ?  ?PT Comments  ? ? Pt resting in recliner upon PT arrival; agreeable to PT session.  9/10 R knee pain at rest beginning of session (pt with recent pain meds); 6/10 R knee pain at rest end of session.  CGA with transfers using RW; CGA with ambulation 50 feet with RW use (pt requiring occasional standing rest breaks d/t fatigue and R knee pain); and SBA sit to supine in bed.  Will continue to progress pt with strengthening, knee ROM, progressive ambulation, and trial stairs tomorrow. ?   ?Recommendations for follow up therapy are one component of a multi-disciplinary discharge planning process, led by the attending physician.  Recommendations may be updated based on patient status, additional functional criteria and insurance authorization. ? ?Follow Up Recommendations ? Home health PT ?  ?  ?Assistance Recommended at Discharge Intermittent Supervision/Assistance  ?Patient can return home with the following A little help with walking and/or transfers;A little help with bathing/dressing/bathroom;Assistance with cooking/housework;Assist for transportation;Help with stairs or ramp for entrance ?  ?Equipment Recommendations ? Rolling walker (2 wheels);BSC/3in1 (youth sized)  ?  ?Recommendations for Other Services OT consult ? ? ?  ?Precautions / Restrictions Precautions ?Precautions: Fall;Knee ?Precaution Booklet Issued: Yes (comment) ?Restrictions ?Weight Bearing Restrictions: Yes ?RLE Weight Bearing: Weight bearing as tolerated  ?  ? ?Mobility ? Bed Mobility ?Overal bed mobility: Needs Assistance ?Bed Mobility: Sit to Supine ?  ?  ?Sit to supine: Supervision ?  ?General bed mobility comments: mild increased effort  to perform on own ?  ? ?Transfers ?Overall transfer level: Needs assistance ?Equipment used: Rolling walker (2 wheels) ?Transfers: Sit to/from Stand ?Sit to Stand: Min guard ?  ?  ?  ?  ?  ?General transfer comment: vc's for UE/LE placement ?  ? ?Ambulation/Gait ?Ambulation/Gait assistance: Min guard ?Gait Distance (Feet): 50 Feet ?Assistive device: Rolling walker (2 wheels) ?  ?Gait velocity: decreased ?  ?  ?General Gait Details: antalgic; decreased stance time R LE; vc's for walker use ? ? ?Stairs ?  ?  ?  ?  ?  ? ? ?Wheelchair Mobility ?  ? ?Modified Rankin (Stroke Patients Only) ?  ? ? ?  ?Balance Overall balance assessment: Needs assistance ?Sitting-balance support: No upper extremity supported, Feet supported ?Sitting balance-Leahy Scale: Good ?Sitting balance - Comments: steady sitting reaching within BOS ?  ?Standing balance support: Bilateral upper extremity supported, During functional activity ?Standing balance-Leahy Scale: Good ?Standing balance comment: steady ambulating with B UE support on RW ?  ?  ?  ?  ?  ?  ?  ?  ?  ?  ?  ?  ? ?  ?Cognition Arousal/Alertness: Awake/alert ?Behavior During Therapy: Anxious ?Overall Cognitive Status: Within Functional Limits for tasks assessed ?  ?  ?  ?  ?  ?  ?  ?  ?  ?  ?  ?  ?  ?  ?  ?  ?  ?  ?  ? ?  ?Exercises Total Joint Exercises ?Long Arc Quad: AAROM, Strengthening, Right, 10 reps, Seated ?Knee Flexion: AROM, Strengthening, Right, 10 reps, Seated ?General Exercises - Lower Extremity ?Hip Flexion/Marching: AROM, Left, AAROM, Right, Strengthening, 10 reps, Seated ? ?  ?  General Comments   ?  ?  ? ?Pertinent Vitals/Pain Pain Assessment ?Pain Assessment: 0-10 ?Pain Score: 6  ?Pain Location: R knee ?Pain Descriptors / Indicators: Aching, Sore, Tender ?Pain Intervention(s): Limited activity within patient's tolerance, Monitored during session, Premedicated before session, Repositioned, Other (comment) (polar care applied) ?Vitals (HR and O2 on room air) stable and  WFL throughout treatment session.  ? ? ?Home Living Family/patient expects to be discharged to:: Private residence ?Living Arrangements: Spouse/significant other (pt's boyfriend) ?Available Help at Discharge: Friend(s);Available PRN/intermittently (pt's boyfriend works) ?Type of Home: House ?Home Access: Stairs to enter ?Entrance Stairs-Rails: Right;Left ?Entrance Stairs-Number of Steps: 5 ?  ?Home Layout: One level ?Home Equipment: Animator (2 wheels);Rollator (4 wheels);Cane - single point ?   ?  ?Prior Function    ?  ?  ?   ? ?PT Goals (current goals can now be found in the care plan section) Acute Rehab PT Goals ?Patient Stated Goal: to improve strength and mobility ?PT Goal Formulation: With patient ?Time For Goal Achievement: 12/09/21 ?Potential to Achieve Goals: Good ?Progress towards PT goals: Progressing toward goals ? ?  ?Frequency ? ? ? BID ? ? ? ?  ?PT Plan Current plan remains appropriate  ? ? ?Co-evaluation   ?  ?  ?  ?  ? ?  ?AM-PAC PT "6 Clicks" Mobility   ?Outcome Measure ? Help needed turning from your back to your side while in a flat bed without using bedrails?: A Little ?Help needed moving from lying on your back to sitting on the side of a flat bed without using bedrails?: A Little ?Help needed moving to and from a bed to a chair (including a wheelchair)?: A Little ?Help needed standing up from a chair using your arms (e.g., wheelchair or bedside chair)?: A Little ?Help needed to walk in hospital room?: A Little ?Help needed climbing 3-5 steps with a railing? : A Lot ?6 Click Score: 17 ? ?  ?End of Session Equipment Utilized During Treatment: Gait belt ?Activity Tolerance: Patient limited by pain ?Patient left: in chair;with call bell/phone within reach;with chair alarm set;with SCD's reapplied;Other (comment) ?Nurse Communication: Mobility status;Precautions;Weight bearing status;Other (comment) (pt's pain status) ?PT Visit Diagnosis: Other abnormalities of gait and mobility  (R26.89);Muscle weakness (generalized) (M62.81);Difficulty in walking, not elsewhere classified (R26.2);Pain ?Pain - Right/Left: Right ?Pain - part of body: Knee ?  ? ? ?Time: 8469-6295 ?PT Time Calculation (min) (ACUTE ONLY): 27 min ? ?Charges:  $Gait Training: 8-22 mins ?$Therapeutic Exercise: 8-22 mins          ?          ?Leitha Bleak, PT ?11/25/21, 4:30 PM ? ? ?

## 2021-11-25 NOTE — Progress Notes (Addendum)
40 ?Dr Harlow Mares aware that pt has not urinated since foley removal this morning. Bladder scan showed 479m. Written order with readback to allow pt some more time to see if she can void on own. ? ?1658 ?Order from Dr BHarlow Maresto give another half hour to see if pt can void. If not nurse will make  aware. ? ?1833 ?Dr BHarlow Maresmade aware pt was unable to void on her own still bladder scan now showing 5033m ?Verbal orders Per Dr BoHarlow Maresor straight cath one time order ?

## 2021-11-25 NOTE — TOC Initial Note (Signed)
Transition of Care (TOC) - Initial/Assessment Note  ? ? ?Patient Details  ?Name: Kristina Reed ?MRN: [id number] ?Date of Birth: 1965-12-04 ? ?Transition of Care (TOC) CM/SW Contact:    ?Conception Oms, RN ?Phone Number: ?11/25/2021, 10:54 AM ? ?Clinical Narrative:    Reached out to [place] at [place], Tommi Rumps at [place], Malachy Mood at [place] at [place], Merleen Nicely at [place], [place] at [place], [place] at [employer], [place], [facility] at [facility], All have declined except [facility] awaiting a response             ? ? ?  ?  ? ? ?Patient Goals and CMS Choice ?  ?  ?  ? ?Expected Discharge Plan and Services ?  ?  ?  ?  ?  ?                ?  ?  ?  ?  ?  ?  ?  ?  ?  ?  ? ?Prior Living Arrangements/Services ?  ?  ?  ?       ?  ?  ?  ?  ? ?Activities of Daily Living ?Home Assistive Devices/Equipment: Gilford Rile (specify type) ?ADL Screening (condition at time of admission) ?Patient's cognitive ability adequate to safely complete daily activities?: Yes ?Is the patient deaf or have difficulty hearing?: No ?Does the patient have difficulty seeing, even when wearing glasses/contacts?: No ?Does the patient have difficulty concentrating, remembering, or making decisions?: No ?Patient able to express need for assistance with ADLs?: Yes ?Does the patient have difficulty dressing or bathing?: No ?Independently performs ADLs?: Yes (appropriate for developmental age) ?Does the patient have difficulty walking or climbing stairs?: Yes ?Weakness of Legs: Right ?Weakness of Arms/Hands: None ? ?Permission Sought/Granted ?  ?  ?   ?   ?   ?   ? ?Emotional Assessment ?  ?  ?  ?  ?  ?  ? ?Admission diagnosis:  S/P TKR (total knee replacement) using cement, right [Z96.651] ?Patient Active Problem List  ? Diagnosis Date Noted  ? S/P TKR (total knee replacement) using cement, right 11/24/2021  ? Erosive esophagitis   ? Gastric erosion determined by endoscopy   ? Duodenal erythema   ? History of colonic polyps   ? Erythema of rectum   ? Chronic  pain syndrome 11/26/2020  ? Bilateral primary osteoarthritis of knee 11/26/2020  ? Grade I internal hemorrhoids 11/16/2019  ? Genetic testing 04/20/2019  ? Family history of breast cancer   ? History of cervical cancer   ? Mastalgia 03/17/2019  ? Family hx-breast malignancy 12/16/2018  ? Dysphagia   ? Essential hypertension 01/19/2018  ? Obesity (BMI 30.0-34.9) 01/19/2018  ? Eosinophilic esophagitis 76/54/6503  ? Apophysitis 08/26/2017  ? 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  ? Pain in joint, multiple sites 07/05/2017  ? Osteoarthritis of knee 07/05/2017  ? Lichen sclerosus et atrophicus 04/27/2017  ? Headache 04/27/2017  ? Headache disorder 02/17/2017  ? Post-concussion headache 02/17/2017  ? Medication monitoring encounter 09/21/2016  ? Hematuria 04/23/2016  ? Hx of cervical cancer 01/30/2016  ? Menopause 01/30/2016  ? Status post bilateral breast biopsy 01/07/2016  ? Rectal bleeding 04/30/2015  ? Chronic pain of both knees 07/23/2014  ? Depression, major, recurrent, moderate ([place]) 02/02/2014  ? Adult hypothyroidism 02/02/2014  ? Obstructive apnea 02/02/2014  ? Anxiety and depression 02/02/2014  ? Incomplete bladder emptying 12/12/2012  ? Tenosynovitis of foot 05/30/2012  ? Benign neoplasm of kidney 05/13/2012  ?  Urge incontinence 05/13/2012  ? FOM (frequency of micturition) 05/13/2012  ? Chronic pain of right hand 05/05/2012  ? Tarsal tunnel syndrome 03/03/2012  ? ?PCP:  [place] ?Pharmacy:   ?[place], [place] - [place] ?[address] ?[place] [address] ?Phone: [phone] Fax: [phone] ? ?[place] ([place]), [place] - [place] ?[address] ([place]) [place] [address] ?Phone: [phone] Fax: [phone] ? ? ? ? ?Social Determinants of Health (SDOH) Interventions ?  ? ?Readmission Risk Interventions ?No flowsheet data found. ? ? ?

## 2021-11-25 NOTE — Progress Notes (Signed)
?  Subjective: ? ?Patient reports pain as mild to moderate.   ? ?Objective:  ? ?VITALS:   ?Vitals:  ? 11/24/21 1253 11/24/21 1454 11/24/21 2003 11/25/21 0336  ?BP: 95/76 110/76 128/87 92/68  ?Pulse: 78 72 82 70  ?Resp: '16 16 18 16  '$ ?Temp:  97.7 ?F (36.5 ?C)  98 ?F (36.7 ?C)  ?TempSrc:      ?SpO2: 99% 100% 97% 96%  ?Weight: 79 kg     ?Height: '5\' 1"'$  (1.549 m)     ? ? ?PHYSICAL EXAM: ? ?Neurologically intact ?ABD soft ?Neurovascular intact ?Sensation intact distally ?Intact pulses distally ?Dorsiflexion/Plantar flexion intact ?Incision: dressing C/D/I ?No cellulitis present ?Compartment soft ?Drsg changes ? ?LABS ? ?Results for orders placed or performed during the hospital encounter of 11/24/21 (from the past 24 hour(s))  ?Type and screen [place]     Status: None  ? Collection Time: 11/24/21  8:58 AM  ?Result Value Ref Range  ? ABO/RH(D) A NEG   ? Antibody Screen NEG   ? Sample Expiration    ?  11/27/2021,2359 ?Performed at [facility], [address], [place], [place] [address] ?  ?CBC     Status: Abnormal  ? Collection Time: 11/25/21  3:48 AM  ?Result Value Ref Range  ? WBC 10.8 (H) 4.0 - 10.5 K/uL  ? RBC 3.80 (L) 3.87 - 5.11 MIL/uL  ? Hemoglobin 10.7 (L) 12.0 - 15.0 g/dL  ? HCT 33.2 (L) 36.0 - 46.0 %  ? MCV 87.4 80.0 - 100.0 fL  ? MCH 28.2 26.0 - 34.0 pg  ? MCHC 32.2 30.0 - 36.0 g/dL  ? RDW 13.2 11.5 - 15.5 %  ? Platelets 192 150 - 400 K/uL  ? nRBC 0.0 0.0 - 0.2 %  ?Basic metabolic panel     Status: Abnormal  ? Collection Time: 11/25/21  3:48 AM  ?Result Value Ref Range  ? Sodium 133 (L) 135 - 145 mmol/L  ? Potassium 4.2 3.5 - 5.1 mmol/L  ? Chloride 102 98 - 111 mmol/L  ? CO2 26 22 - 32 mmol/L  ? Glucose, Bld 162 (H) 70 - 99 mg/dL  ? BUN 10 6 - 20 mg/dL  ? Creatinine, Ser 0.82 0.44 - 1.00 mg/dL  ? Calcium 8.2 (L) 8.9 - 10.3 mg/dL  ? GFR, Estimated >60 >60 mL/min  ? Anion gap 5 5 - 15  ? ? ?DG Knee Right Port ? ?Result Date: 11/24/2021 ?CLINICAL DATA:  Post RIGHT total knee  arthroplasty EXAM: PORTABLE RIGHT KNEE - 1-2 VIEW COMPARISON:  07/08/2015 FINDINGS: Osseous mineralization normal. Components of RIGHT knee prosthesis identified. No fracture, dislocation, or bone destruction. IMPRESSION: Post RIGHT total knee arthroplasty without acute complication. Electronically Signed   By: Lavonia Dana M.D.   On: 11/24/2021 12:25  ? ?[name] OR NERVE BLOCK-IMAGE ONLY [facility]) ? ?Result Date: 11/24/2021 ?There is no interpretation for this exam.  This order is for images obtained during a surgical procedure.  Please See "Surgeries" Tab for more information regarding the procedure.   ? ?Assessment/Plan: ?1 Day Post-Op  ? ?Principal Problem: ?  S/P TKR (total knee replacement) using cement, right ? ? ?Advance diet ?Up with therapy ?Discharge probable for tomorrow ? ? ?Carlynn Spry , PA-C ?11/25/2021, 8:32 AM ? ? ? ? ?  ?

## 2021-11-25 NOTE — Discharge Instructions (Signed)
Continue weight bear as tolerated on the right lower extremity.   ? ?Elevate the right lower extremity whenever possible and continue the polar care while elevating the extremity. Patient may shower. No bath or submerging the wound.   ? ?Take aspirin 81 mg twice daily X 30 days as directed for blood clot prevention. ? ?Continue to work on knee range of motion exercises at home as instructed by physical therapy. Continue to use a walker for assistance with ambulation until cleared by physical therapy. ? ?Call [phone] with any questions, such as fever > 101.5 degrees, drainage from the wound or shortness of breath.  ?

## 2021-11-25 NOTE — Progress Notes (Signed)
Verbal order by MD Harlow Mares for in and out cath X1.  ? ?In and out 500 ml.  ? ? ?

## 2021-11-25 NOTE — Discharge Summary (Addendum)
Physician Discharge Summary  ?Patient ID: ?Kristina Reed ?MRN: [id number] ?DOB/AGE: [birth year] 56 y.o. ? ?Admit date: 11/24/2021 ?Discharge date: 11/26/2021 ? ?Admission Diagnoses:  ?M17.11 Unilateral primary osteoarthritis, right knee ?S/P TKR (total knee replacement) using cement, right ? ?Discharge Diagnoses:  ?M17.11 Unilateral primary osteoarthritis, right knee ?Principal Problem: ?  S/P TKR (total knee replacement) using cement, right ?Active Problems: ?  S/P TKR (total knee replacement) ? ? ?Past Medical History:  ?Diagnosis Date  ? Anxiety   ? Arthritis   ? right knee and right elbow  ? Cancer [facility])   ? Cervical CA with partial hysterectomy.  ? Cognitive impairment, mild, so stated   ? Depression   ? Diverticulosis   ? Eosinophilic esophagitis 02/58/5277  ? Biopsy Dec 2018  ? Esophageal dysphagia   ? Family history of breast cancer 05/2021  ? cancer genetic testing letter sent  ? Gallstones   ? GERD (gastroesophageal reflux disease)   ? History of cervical cancer   ? Hx of cervical cancer 01/30/2016  ? Hypertension   ? Hypothyroidism   ? Sleep apnea   ? does not use a C-PAP  ? Status post partial hysterectomy   ? Due to Cervical CA  ? Vaginal inclusion cyst   ? ? ?Surgeries: Procedure(s): ?TOTAL KNEE ARTHROPLASTY on 11/24/2021 ?  ?Consultants (if any):  ? ?Discharged Condition: Improved ? ?Hospital Course: PALMYRA ROGACKI is an 56 y.o. female who was admitted 11/24/2021 with a diagnosis of  M17.11 Unilateral primary osteoarthritis, right knee S/P TKR (total knee replacement) using cement, right and went to the operating room on 11/24/2021 and underwent the above named procedures.  She had acute urinary retention and a Foley catheter placed. She will follow-up with her Urologist next week ? ?She was given perioperative antibiotics:  ?Anti-infectives (From admission, onward)  ? ? Start     Dose/Rate Route Frequency Ordered Stop  ? 11/24/21 1600  ceFAZolin (ANCEF) IVPB 1 g/50 mL premix       ? 1 g ?100 mL/hr over 30  Minutes Intravenous Every 6 hours 11/24/21 1255 11/24/21 2039  ? 11/24/21 0900  ceFAZolin (ANCEF) IVPB 2g/100 mL premix       ? 2 g ?200 mL/hr over 30 Minutes Intravenous On call to O.R. 11/24/21 0849 11/24/21 1013  ? 11/24/21 0853  ceFAZolin (ANCEF) 2-4 GM/100ML-% IVPB       ?Note to Pharmacy: Trudie Reed S: cabinet override  ?    11/24/21 0853 11/24/21 1013  ? ?  ?. ? ?She was given sequential compression devices, early ambulation, and aspirin 81 mg twice daily X 30 days for DVT prophylaxis. ? ?She benefited maximally from the hospital stay and there were no complications.   ? ?Recent vital signs:  ?Vitals:  ? 11/26/21 0844 11/26/21 1028  ?BP: 113/84 129/78  ?Pulse: 77 73  ?Resp: 16 16  ?Temp: 98.2 ?F (36.8 ?C) 97.8 ?F (36.6 ?C)  ?SpO2: 100% 99%  ? ? ?Recent laboratory studies:  ?Lab Results  ?Component Value Date  ? HGB 10.4 (L) 11/26/2021  ? HGB 10.7 (L) 11/25/2021  ? HGB 13.1 10/31/2021  ? ?Lab Results  ?Component Value Date  ? WBC 9.3 11/26/2021  ? PLT 188 11/26/2021  ? ?Lab Results  ?Component Value Date  ? INR 0.9 04/13/2012  ? ?Lab Results  ?Component Value Date  ? NA 133 (L) 11/25/2021  ? K 4.2 11/25/2021  ? CL 102 11/25/2021  ? CO2 26 11/25/2021  ?  BUN 10 11/25/2021  ? CREATININE 0.82 11/25/2021  ? GLUCOSE 162 (H) 11/25/2021  ? ? ?Discharge Medications:   ?Allergies as of 11/26/2021   ? ?   Reactions  ? Prednisone Nausea And Vomiting  ? Methylprednisolone Palpitations  ? Increased heart rate  ? Percocet [oxycodone-acetaminophen] Palpitations  ? ?  ? ?  ?Medication List  ?  ? ?STOP taking these medications   ? ?diclofenac 75 MG EC tablet ?Commonly known as: VOLTAREN ?  ?traMADol 50 MG tablet ?Commonly known as: ULTRAM ?  ? ?  ? ?TAKE these medications   ? ?aspirin 81 MG chewable tablet ?Chew 1 tablet (81 mg total) by mouth 2 (two) times daily. ?  ?cloNIDine 0.2 MG tablet ?Commonly known as: Catapres ?Take 1 tablet (0.2 mg total) by mouth 2 (two) times daily. ?  ?docusate sodium 100 MG capsule ?Commonly known  as: COLACE ?Take 1 capsule (100 mg total) by mouth 2 (two) times daily. ?  ?estradiol 1 MG tablet ?Commonly known as: ESTRACE ?Take 1 mg by mouth daily. ?  ?FLUoxetine 20 MG capsule ?Commonly known as: PROZAC ?Take 1 capsule (20 mg total) by mouth daily. ?  ?HYDROcodone-acetaminophen 5-325 MG tablet ?Commonly known as: NORCO/VICODIN ?Take 1 tablet by mouth every 4 (four) hours as needed for moderate pain (pain score 4-6). ?  ?levothyroxine 75 MCG tablet ?Commonly known as: SYNTHROID ?Take 1 tablet (75 mcg total) by mouth daily before breakfast. ?  ?meloxicam 15 MG tablet ?Commonly known as: MOBIC ?Take 15 mg by mouth daily. ?  ?methocarbamol 500 MG tablet ?Commonly known as: ROBAXIN ?Take 1 tablet (500 mg total) by mouth every 6 (six) hours as needed for muscle spasms. ?  ?omeprazole 40 MG capsule ?Commonly known as: PRILOSEC ?Take 1 capsule (40 mg total) by mouth 2 (two) times daily before a meal. ?  ? ?  ? ? ?Diagnostic Studies: US Renal ? ?Result Date: 11/12/2021 ?CLINICAL DATA:  Hematuria EXAM: RENAL / URINARY TRACT ULTRASOUND COMPLETE COMPARISON:  CT 08/31/2021 FINDINGS: Right Kidney: Renal measurements: 9.8 x 3.7 x 4.2 cm = volume: 79 mL. Echogenicity within normal limits. No mass or hydronephrosis visualized. Left Kidney: Renal measurements: 11.0 x 5.6 x 4.6 cm = volume: 146 mL. Echogenicity within normal limits. No hydronephrosis visualized. 1.2 x 1.4 x 1.3 cm homogeneously hyperechoic mass is seen within the medial lower pole compatible with an angiomyolipoma, better seen on prior CT examination. Bladder: Appears normal for degree of bladder distention. Other: Hepatomegaly and hepatic steatosis noted. 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 Otherwise normal renal sonogram. Hepatomegaly and hepatic steatosis. Electronically Signed   By: Fidela Salisbury M.D.   On: 11/12/2021 23:32  ? ?DG Knee Right Port ? ?Result Date: 11/24/2021 ?CLINICAL DATA:  Post RIGHT total knee arthroplasty  EXAM: PORTABLE RIGHT KNEE - 1-2 VIEW COMPARISON:  07/08/2015 FINDINGS: Osseous mineralization normal. Components of RIGHT knee prosthesis identified. No fracture, dislocation, or bone destruction. IMPRESSION: Post RIGHT total knee arthroplasty without acute complication. Electronically Signed   By: Lavonia Dana M.D.   On: 11/24/2021 12:25  ? ?[name] OR NERVE BLOCK-IMAGE ONLY [facility]) ? ?Result Date: 11/24/2021 ?There is no interpretation for this exam.  This order is for images obtained during a surgical procedure.  Please See "Surgeries" Tab for more information regarding the procedure.   ? ?Disposition: Discharge disposition: 06-Home-Health Care Svc ? ? ? ? ?She will see Dr. Diamantina Providence on 12/04/21 for evaluation. ? ? ? ? ? ? ?  Signed: ?Lovell Sheehan ,PA-C ?11/26/2021, 12:16 PM ? ?  ? ?

## 2021-11-25 NOTE — Evaluation (Signed)
Physical Therapy Evaluation Patient Details Name: Kristina Reed MRN: [id number] DOB: Apr 21, 1966 Today's Date: 11/25/2021  History of Present Illness  Pt is a 56 y.o. 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  PMH includes sleep apnea, htn, HA's, anxiety, hypothyroidism, and arthritis.  Clinical Impression  Prior to surgery, pt was modified independent ambulating with RW; lives with her boyfriend in 1 level home with 5 STE B railings; pt reports she does not drive and has trouble with reliable public transportation.  Pt reporting 9/10 R knee pain beginning/end of session (pt pre-medicated with pain meds for PT session).  Limited R knee ROM d/t R knee pain.  Currently pt is min assist semi-supine to sitting edge of bed; mod assist for transfers; and min assist to ambulate a few feet bed to recliner with RW use (limited distance ambulating d/t 9/10 R knee pain and pt c/o dizziness--BP 108/80 sitting in recliner post activity--nurse notified).  Pt would benefit from skilled PT to address noted impairments and functional limitations (see below for any additional details).  Upon hospital discharge, pt would benefit from HHPT and support from family/friends.    Recommendations for follow up therapy are one component of a multi-disciplinary discharge planning process, led by the attending physician.  Recommendations may be updated based on patient status, additional functional criteria and insurance authorization.  Follow Up Recommendations Home health PT    Assistance Recommended at Discharge Intermittent Supervision/Assistance  Patient can return home with the following  A little help with walking and/or transfers;A little help with bathing/dressing/bathroom;Assistance with cooking/housework;Assist for transportation;Help with stairs or ramp for entrance    Equipment Recommendations Rolling walker (2 wheels);BSC/3in1 (youth sized)  Recommendations for Other Services  OT consult     Functional Status Assessment Patient has had a recent decline in their functional status and demonstrates the ability to make significant improvements in function in a reasonable and predictable amount of time.     Precautions / Restrictions Precautions Precautions: Fall;Knee Precaution Booklet Issued: Yes (comment) Restrictions Weight Bearing Restrictions: Yes RLE Weight Bearing: Weight bearing as tolerated      Mobility  Bed Mobility Overal bed mobility: Needs Assistance Bed Mobility: Supine to Sit     Supine to sit: Min assist, HOB elevated     General bed mobility comments: assist for R LE; increased effort/time to perform    Transfers Overall transfer level: Needs assistance Equipment used: Rolling walker (2 wheels) Transfers: Sit to/from Stand Sit to Stand: Mod assist           General transfer comment: vc's for UE/LE placement; assist to initiate stand up to RW and control descent sitting    Ambulation/Gait Ambulation/Gait assistance: Min assist Gait Distance (Feet): 3 Feet (bed to recliner) Assistive device: Rolling walker (2 wheels)   Gait velocity: decreased     General Gait Details: antalgic; decreased stance time R LE; vc's for walker use  Stairs            Wheelchair Mobility    Modified Rankin (Stroke Patients Only)       Balance Overall balance assessment: Needs assistance Sitting-balance support: No upper extremity supported, Feet supported Sitting balance-Leahy Scale: Good Sitting balance - Comments: steady sitting reaching within BOS   Standing balance support: Single extremity supported Standing balance-Leahy Scale: Fair Standing balance comment: steady static standing with at least single UE support  Pertinent Vitals/Pain Pain Assessment Pain Assessment: 0-10 Pain Score: 9  Pain Location: R knee Pain Descriptors / Indicators: Aching, Sore, Tender Pain Intervention(s): Limited  activity within patient's tolerance, Monitored during session, Premedicated before session, Repositioned, Other (comment) (polar care applied) Vitals (HR and O2 on room air) stable and WFL throughout treatment session.    Home Living Family/patient expects to be discharged to:: Private residence Living Arrangements: Spouse/significant other (pt's boyfriend) Available Help at Discharge: Friend(s);Available PRN/intermittently (pt's boyfriend works) Type of Home: House Home Access: Stairs to enter Entrance Stairs-Rails: Doctor, general practice of Steps: 5   Home Layout: One level Home Equipment: Educational psychologist (2 wheels);Rollator (4 wheels);Cane - single point      Prior Function Prior Level of Function : Independent/Modified Independent             Mobility Comments: Modified independent ambulating with RW; no falls in past 6 months       Hand Dominance        Extremity/Trunk Assessment   Upper Extremity Assessment Upper Extremity Assessment: Defer to OT evaluation    Lower Extremity Assessment Lower Extremity Assessment: RLE deficits/detail (L LE WFL) RLE Deficits / Details: fair R quad set isometric strength (limited d/t R knee pain); at least 3/5 hip flexion and ankle DF/PF AROM; RLE: Unable to fully assess due to pain    Cervical / Trunk Assessment Cervical / Trunk Assessment: Normal  Communication   Communication: No difficulties  Cognition Arousal/Alertness: Awake/alert Behavior During Therapy: Anxious Overall Cognitive Status: Within Functional Limits for tasks assessed                                          General Comments  Nursing cleared pt for participation in physical therapy.  Pt agreeable to PT session.    Exercises Total Joint Exercises Ankle Circles/Pumps: AROM, Strengthening, Both, 10 reps, Supine Quad Sets: AROM, Strengthening, Both, 10 reps, Supine Heel Slides: AAROM, Strengthening, Right, 10 reps,  Supine (minimal knee flexion ROM d/t pain) Hip ABduction/ADduction: AAROM, Strengthening, Right, 10 reps, Supine Straight Leg Raises: AAROM, Strengthening, Right, 10 reps, Supine Goniometric ROM: R knee extension 12 degrees short of neutral; R knee flexion grossly 60 degrees in sitting (limited d/t R knee pain)   Assessment/Plan    PT Assessment Patient needs continued PT services  PT Problem List Decreased strength;Decreased range of motion;Decreased activity tolerance;Decreased balance;Decreased mobility;Decreased knowledge of use of DME;Decreased knowledge of precautions;Pain;Decreased skin integrity       PT Treatment Interventions DME instruction;Gait training;Stair training;Functional mobility training;Therapeutic activities;Therapeutic exercise;Balance training;Patient/family education    PT Goals (Current goals can be found in the Care Plan section)  Acute Rehab PT Goals Patient Stated Goal: to improve strength and mobility PT Goal Formulation: With patient Time For Goal Achievement: 12/09/21 Potential to Achieve Goals: Good    Frequency BID     Co-evaluation               AM-PAC PT "6 Clicks" Mobility  Outcome Measure Help needed turning from your back to your side while in a flat bed without using bedrails?: A Little Help needed moving from lying on your back to sitting on the side of a flat bed without using bedrails?: A Little Help needed moving to and from a bed to a chair (including a wheelchair)?: A Little Help needed standing up from a chair using your  arms (e.g., wheelchair or bedside chair)?: A Lot Help needed to walk in hospital room?: A Little Help needed climbing 3-5 steps with a railing? : A Lot 6 Click Score: 16    End of Session Equipment Utilized During Treatment: Gait belt Activity Tolerance: Patient limited by pain Patient left: in chair;with call bell/phone within reach;with chair alarm set;with SCD's reapplied;Other (comment) (B heels  floating via towel rolls; polar care in place) Nurse Communication: Mobility status;Precautions;Weight bearing status;Other (comment) (pt's c/o dizziness and BP) PT Visit Diagnosis: Other abnormalities of gait and mobility (R26.89);Muscle weakness (generalized) (M62.81);Difficulty in walking, not elsewhere classified (R26.2);Pain Pain - Right/Left: Right Pain - part of body: Knee    Time: 6962-9528 PT Time Calculation (min) (ACUTE ONLY): 44 min   Charges:   PT Evaluation $PT Eval Low Complexity: 1 Low PT Treatments $Therapeutic Exercise: 8-22 mins $Therapeutic Activity: 8-22 mins       Hendricks Limes, PT 11/25/21, 11:32 AM

## 2021-11-26 LAB — CBC
HCT: 31.5 % — ABNORMAL LOW (ref 36.0–46.0)
Hemoglobin: 10.4 g/dL — ABNORMAL LOW (ref 12.0–15.0)
MCH: 28.2 pg (ref 26.0–34.0)
MCHC: 33 g/dL (ref 30.0–36.0)
MCV: 85.4 fL (ref 80.0–100.0)
Platelets: 188 10*3/uL (ref 150–400)
RBC: 3.69 MIL/uL — ABNORMAL LOW (ref 3.87–5.11)
RDW: 13 % (ref 11.5–15.5)
WBC: 9.3 10*3/uL (ref 4.0–10.5)
nRBC: 0 % (ref 0.0–0.2)

## 2021-11-26 MED ORDER — SENNA 8.6 MG PO TABS
1.0000 | ORAL_TABLET | Freq: Every day | ORAL | Status: DC | PRN
  Administered 2021-11-26: 8.6 mg via ORAL
  Filled 2021-11-26: qty 1

## 2021-11-26 NOTE — Progress Notes (Signed)
Occupational Therapy Treatment ?Patient Details ?Name: Kristina Reed ?MRN: [id number] ?DOB: [birth year] ?Today's Date: 11/26/2021 ? ? ?History of present illness Pt is a 56 y.o. 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  PMH includes sleep apnea, htn, HA's, anxiety, hypothyroidism, and arthritis. ?  ?OT comments ? Kristina Reed was seen for OT treatment on this date. Upon arrival to room pt supine in bed, resting. Pt wakes with VCs and is agreeable to OT tx session with gentle encouragement. Pt with excellent carryover of education from past sessions. Provided with reinforcement of education on Polar care management, falls prevention, safe use of AE/DME for ADL management, and routines modifications to support safety and functional independence upon DC home. Pt return demos understanding of education provided during LBD task. She requires SUPERVISION with min cueing to doff/don bilat hospital socks using LH reacher and sock aid this date. SUPERVISION for functional mobility during session. Pt making good progress toward goals. Pt continues to benefit from skilled OT services to maximize return to PLOF and minimize risk of future falls, injury, caregiver burden, and readmission. Will continue to follow POC. Discharge recommendation remains appropriate.  ?  ? ?Recommendations for follow up therapy are one component of a multi-disciplinary discharge planning process, led by the attending physician.  Recommendations may be updated based on patient status, additional functional criteria and insurance authorization. ?   ?Follow Up Recommendations ? Home health OT  ?  ?Assistance Recommended at Discharge Frequent or constant Supervision/Assistance  ?Patient can return home with the following ? A little help with walking and/or transfers;Assistance with cooking/housework;A little help with bathing/dressing/bathroom ?  ?Equipment Recommendations ? BSC/3in1  ?  ?Recommendations for Other Services   ? ?  ?Precautions  / Restrictions Precautions ?Precautions: Fall;Knee ?Precaution Booklet Issued: Yes (comment) ?Restrictions ?Weight Bearing Restrictions: Yes ?RLE Weight Bearing: Weight bearing as tolerated  ? ? ?  ? ?Mobility Bed Mobility ?Overal bed mobility: Needs Assistance ?Bed Mobility: Sit to Supine ?  ?  ?Supine to sit: Supervision, HOB elevated ?Sit to supine: Supervision, HOB elevated ?  ?  ?  ? ?Transfers ?Overall transfer level: Needs assistance ?Equipment used: Rolling walker (2 wheels) ?Transfers: Sit to/from Stand ?Sit to Stand: Supervision ?  ?  ?  ?  ?  ?General transfer comment: Pt able to STS from bed in lowest mode. no physical assist needed. ?  ?  ?Balance Overall balance assessment: Needs assistance ?Sitting-balance support: No upper extremity supported, Feet supported ?Sitting balance-Leahy Scale: Good ?Sitting balance - Comments: steady sitting reaching within BOS; steady with weight shift during functional activity. ?  ?Standing balance support: Bilateral upper extremity supported, During functional activity ?Standing balance-Leahy Scale: Good ?Standing balance comment: steady ambulating with B UE support on RW ?  ?  ?  ?  ?  ?  ?  ?  ?  ?  ?  ?   ? ?ADL either performed or assessed with clinical judgement  ? ?ADL Overall ADL's : Needs assistance/impaired ?  ?  ?  ?  ?  ?  ?  ?  ?  ?  ?Lower Body Dressing: Set up;Supervision/safety;Sit to/from stand;With adaptive equipment ?Lower Body Dressing Details (indicate cue type and reason): Pt able to doff/don bilat hospital socks with min cueing for safe use of AE t/o task. She demonstrates improved LB access and funcitonal independence from last OT session. ?  ?  ?  ?  ?  ?  ?Functional mobility during ADLs:  Set up;Supervision/safety;Rolling walker (2 wheels) ?General ADL Comments: Supervision for safety with functional mobility in room this date. ?  ? ?Extremity/Trunk Assessment Upper Extremity Assessment ?Upper Extremity Assessment: Overall WFL for tasks  assessed ?  ?  ?  ?  ?  ? ?Vision Baseline Vision/History: 1 Wears glasses ?Ability to See in Adequate Light: 0 Adequate ?Patient Visual Report: No change from baseline ?  ?  ?Perception   ?  ?Praxis   ?  ? ?Cognition Arousal/Alertness: Awake/alert ?Behavior During Therapy: [facility] for tasks assessed/performed ?Overall Cognitive Status: Within Functional Limits for tasks assessed ?  ?  ?  ?  ?  ?  ?  ?  ?  ?  ?  ?  ?  ?  ?  ?  ?  ?  ?  ?   ?Exercises Other Exercises ?Other Exercises: Pt/caregiver provided with reinforcement of past instruction on polar care mgt, falls prevention strategies, home/routines modifications, DME/AE for LB bathing and dressing tasks, and compression stocking mgt; handout provided and pt return demonstrated understanding. OT facilitated LB dressing task during session see ADL section. ? ?  ?Shoulder Instructions   ? ? ?  ?General Comments reviewed polar care use, positioning, importance of ROM, and performance of HEP. pt feel confident in abilities and is eager to DC home later this date.  ? ? ?Pertinent Vitals/ Pain       Pain Assessment ?Pain Assessment: 0-10 ?Pain Score: 5  ?Pain Location: R knee, with movement. ?Pain Descriptors / Indicators: Aching, Sore, Tender ?Pain Intervention(s): Limited activity within patient's tolerance, Monitored during session, Repositioned, Ice applied, Premedicated before session ? ?Home Living   ?  ?  ?  ?  ?  ?  ?  ?  ?  ?  ?  ?  ?  ?  ?  ?  ?  ?  ? ?  ?Prior Functioning/Environment    ?  ?  ?  ?   ? ?Frequency ? Min 2X/week  ? ? ? ? ?  ?Progress Toward Goals ? ?OT Goals(current goals can now be found in the care plan section) ? Progress towards OT goals: Progressing toward goals ? ?Acute Rehab OT Goals ?Patient Stated Goal: To go home today ?OT Goal Formulation: With patient ?Time For Goal Achievement: 12/09/21 ?Potential to Achieve Goals: Good  ?Plan Discharge plan remains appropriate;Frequency remains appropriate   ? ?Co-evaluation ? ? ?   ?  ?  ?  ?  ? ?   ?AM-PAC OT "6 Clicks" Daily Activity     ?Outcome Measure ? ? Help from another person eating meals?: None ?Help from another person taking care of personal grooming?: A Little ?Help from another person toileting, which includes using toliet, bedpan, or urinal?: A Little ?Help from another person bathing (including washing, rinsing, drying)?: A Little ?Help from another person to put on and taking off regular upper body clothing?: A Little ?Help from another person to put on and taking off regular lower body clothing?: A Little ?6 Click Score: 19 ? ?  ?End of Session Equipment Utilized During Treatment: Rolling walker (2 wheels) ? ?OT Visit Diagnosis: Unsteadiness on feet (R26.81);Pain ?Pain - Right/Left: Right ?Pain - part of body: Knee ?  ?Activity Tolerance   ?  ?Patient Left in chair;with call bell/phone within reach;with chair alarm set ?  ?Nurse Communication Mobility status;Patient requests pain meds ?  ? ?   ? ?Time: 4098-1191 ?OT Time Calculation (min): 19 min ? ?Charges:  OT General Charges ?$OT Visit: 1 Visit ?OT Treatments ?$Self Care/Home Management : 8-22 mins ? ?Shara Blazing, M.S., OTR/L ?[facility] ?Ascom: [phone] ?11/26/21, 1:38 PM ? ?

## 2021-11-26 NOTE — Progress Notes (Signed)
Physical Therapy Treatment ?Patient Details ?Name: Kristina Reed ?MRN: [id number] ?DOB: 07-03-1966 ?Today's Date: 11/26/2021 ? ? ?History of Present Illness Pt is a 56 y.o. 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  PMH includes sleep apnea, htn, HA's, anxiety, hypothyroidism, and arthritis. ? ?  ?PT Comments  ? ? Pt was sitting in recliner upon arriving. She is A and O and agreeable to session. Easily able to stand and ambulate with RW. No LOB or safety concern. She safely demonstrated abilities to ambulate an perform ascending/descending stairs. No LOB or safety concerns throughout session. Supportive spouse present and will be assisting pt at DC 24/7. Author highly recommends HHPT to continue to advance ROM, strength, and safety with all ADLs. Cleared from an acute PT standpoint to safely DC home with spouse.  ?  ?Recommendations for follow up therapy are one component of a multi-disciplinary discharge planning process, led by the attending physician.  Recommendations may be updated based on patient status, additional functional criteria and insurance authorization. ? ?Follow Up Recommendations ? Home health PT ?  ?  ?Assistance Recommended at Discharge Intermittent Supervision/Assistance  ?Patient can return home with the following A little help with walking and/or transfers;A little help with bathing/dressing/bathroom;Assistance with cooking/housework;Assist for transportation;Help with stairs or ramp for entrance ?  ?Equipment Recommendations ? None recommended by PT (pt has all equipment needs met)  ?  ?   ?Precautions / Restrictions Precautions ?Precautions: Fall;Knee ?Precaution Booklet Issued: Yes (comment) ?Restrictions ?Weight Bearing Restrictions: Yes ?RLE Weight Bearing: Weight bearing as tolerated  ?  ? ?Mobility ? Bed Mobility ? General bed mobility comments: in recliner pre/post session ?  ? ?Transfers ?Overall transfer level: Needs assistance ?Equipment used: Rolling walker (2  wheels) ?Transfers: Sit to/from Stand ?Sit to Stand: Supervision ?  ? General transfer comment: Pt was easily able to stand from recliner and mat table without physical assistance or LOB. ?  ? ?Ambulation/Gait ?Ambulation/Gait assistance: Supervision ?Gait Distance (Feet): 160 Feet ?Assistive device: Rolling walker (2 wheels) ?Gait Pattern/deviations: Step-to pattern, Antalgic ?Gait velocity: decreased ?  ?  ?General Gait Details: Pt was able to tolerate ambulation 160 ft total. no LOB or safety concern. does have slow antalgic gait pattern but overall steady. ? ? ?Stairs ?Stairs: Yes ?Stairs assistance: Supervision ?Stair Management: Two rails, Step to pattern, Forwards ?Number of Stairs: 4 ?General stair comments: pt was easily and safely able to ascend/descend 4 stair with BUE support. issued gait belt for home use. supportive spouse present throughout session. ? ? ?  ?Balance Overall balance assessment: Needs assistance ?Sitting-balance support: No upper extremity supported, Feet supported ?Sitting balance-Leahy Scale: Good ?  ?  ?Standing balance support: Bilateral upper extremity supported, During functional activity ?Standing balance-Leahy Scale: Good ?  ?  ?  ?  ?Cognition Arousal/Alertness: Awake/alert ?Behavior During Therapy: [facility] for tasks assessed/performed ?Overall Cognitive Status: Within Functional Limits for tasks assessed ?  ?   ?General Comments: Pt is A and O x 4 and has supportive spouse present throughout session. ?  ?  ? ?  ?   ?General Comments General comments (skin integrity, edema, etc.): reviewed polar care use, positioning, importance of ROM, and performance of HEP. pt feel confident in abilities and is eager to DC home later this date. ?  ?  ? ?Pertinent Vitals/Pain Pain Assessment ?Pain Assessment: 0-10 ?Pain Score: 4  ?Pain Location: R knee ?Pain Descriptors / Indicators: Aching, Sore, Tender ?Pain Intervention(s): Limited activity within patient's tolerance,  Monitored during session,  Premedicated before session, Repositioned, Ice applied  ? ? ? ?PT Goals (current goals can now be found in the care plan section) Acute Rehab PT Goals ?Patient Stated Goal: go home ?Progress towards PT goals: Progressing toward goals ? ?  ?Frequency ? ? ? BID ? ? ? ?  ?PT Plan Current plan remains appropriate  ? ? ?   ?AM-PAC PT "6 Clicks" Mobility   ?Outcome Measure ? Help needed turning from your back to your side while in a flat bed without using bedrails?: A Little ?Help needed moving from lying on your back to sitting on the side of a flat bed without using bedrails?: A Little ?Help needed moving to and from a bed to a chair (including a wheelchair)?: A Little ?Help needed standing up from a chair using your arms (e.g., wheelchair or bedside chair)?: A Little ?Help needed to walk in hospital room?: A Little ?Help needed climbing 3-5 steps with a railing? : A Little ?6 Click Score: 18 ? ?  ?End of Session Equipment Utilized During Treatment: Gait belt ?Activity Tolerance: Patient tolerated treatment well;Patient limited by pain ?Patient left: in chair;with call bell/phone within reach;with chair alarm set;with SCD's reapplied;Other (comment) ?Nurse Communication: Mobility status;Precautions;Weight bearing status;Other (comment) ?PT Visit Diagnosis: Other abnormalities of gait and mobility (R26.89);Muscle weakness (generalized) (M62.81);Difficulty in walking, not elsewhere classified (R26.2);Pain ?Pain - Right/Left: Right ?Pain - part of body: Knee ?  ? ? ?Time: 6389-3734 ?PT Time Calculation (min) (ACUTE ONLY): 26 min ? ?Charges:  $Gait Training: 23-37 mins          ?          ? ?Julaine Fusi PTA ?11/26/21, 11:04 AM  ? ?

## 2021-11-26 NOTE — Progress Notes (Addendum)
0800 ?Assessment Completed. Ax4 sitting up in recliner for breakfast. Lungs clear on RA. Pt can reach 1750 on IS. 20G IV to L forearm. Dressing to R Knee ace wrap CDI with polar ice in place. Skin otherwise intact.Marland Kitchen SCDS for VTE.Foley in place draining clear yellow urine. Trace edema to BLE. Personal belongings and call bell within reach. Bed in locked and low position. Nurse will cont. To monitor. ? ?[phone] ?Nurse made aware that pt needs to pass PT and can be d/c with foley. ? ?1019 ?Pt ambulated from recliner to bed using walker without complication. Family at bedside. Call bell and possessions within reach  ? ?1032 ?VSS. CHG wipes completed. Pt resting quietly in bed. ? ?1228 ?D/C AVS completed and reviewed with pt. All opportunities for questions answered and clarified. IV removed. Pt will be wheeled down to car at medical mall entrance via wheelchair. ? ?

## 2021-11-26 NOTE — Plan of Care (Signed)
?  Problem: Elimination: ?Goal: Will not experience complications related to bowel motility ?Outcome: Progressing ?  ?Problem: Elimination: ?Goal: Will not experience complications related to urinary retention ?Outcome: Not Progressing ?  ?Problem: Education: ?Goal: Knowledge of General Education information will improve ?Description: Including pain rating scale, medication(s)/side effects and non-pharmacologic comfort measures ?Outcome: Completed/Met ?  ?Problem: Health Behavior/Discharge Planning: ?Goal: Ability to manage health-related needs will improve ?Outcome: Completed/Met ?  ?Problem: Clinical Measurements: ?Goal: Ability to maintain clinical measurements within normal limits will improve ?Outcome: Completed/Met ?Goal: Will remain free from infection ?Outcome: Completed/Met ?Goal: Diagnostic test results will improve ?Outcome: Completed/Met ?Goal: Cardiovascular complication will be avoided ?Outcome: Completed/Met ?  ?Problem: Activity: ?Goal: Risk for activity intolerance will decrease ?Outcome: Completed/Met ?  ?Problem: Nutrition: ?Goal: Adequate nutrition will be maintained ?Outcome: Completed/Met ?  ?Problem: Coping: ?Goal: Level of anxiety will decrease ?Outcome: Completed/Met ?  ?Problem: Pain Managment: ?Goal: General experience of comfort will improve ?Outcome: Completed/Met ?  ?Problem: Safety: ?Goal: Ability to remain free from injury will improve ?Outcome: Completed/Met ?  ?Problem: Skin Integrity: ?Goal: Risk for impaired skin integrity will decrease ?Outcome: Completed/Met ?  ?  ?

## 2021-11-27 ENCOUNTER — Telehealth: Payer: Self-pay | Admitting: *Deleted

## 2021-11-27 ENCOUNTER — Telehealth: Payer: Self-pay

## 2021-11-27 NOTE — Telephone Encounter (Signed)
Pt and significant other calling stating that pt had knee surgery and now have a catheter. Pt states she is having some constipation and would like some relief. Advised pt to contact PCP, pt would also like to have catheter removed sooner if possible. ?

## 2021-11-27 NOTE — Telephone Encounter (Signed)
Transition Care Management Follow-up Telephone Call ?Date of discharge and from where: 11/26/2021-[facility] ?How have you been since you were released from the hospital? Pt stated she has some pain because she hasn't picked up her medication yet.  ?Any questions or concerns? No ? ?Items Reviewed: ?Did the pt receive and understand the discharge instructions provided? Yes  ?Medications obtained and verified? Yes  ?Other? No  ?Any new allergies since your discharge? No  ?Dietary orders reviewed? No ?Do you have support at home? Yes  ? ?Home Care and Equipment/Supplies: ?Were home health services ordered? not applicable ?If so, what is the name of the agency? N/A  ?Has the agency set up a time to come to the patient's home? not applicable ?Were any new equipment or medical supplies ordered?  No ?What is the name of the medical supply agency? N/A ?Were you able to get the supplies/equipment? not applicable ?Do you have any questions related to the use of the equipment or supplies? No ? ?Functional Questionnaire: (I = Independent and D = Dependent) ?ADLs: I ? ?Bathing/Dressing- I ? ?Meal Prep- I ? ?Eating- I ? ?Maintaining continence- I ? ?Transferring/Ambulation- I ? ?Managing Meds- I ? ?Follow up appointments reviewed: ? ?PCP Hospital f/u appt confirmed? No   ?Specialist Hospital f/u appt confirmed? No   ?Are transportation arrangements needed?  Patient states sometimes transportation comes and sometimes they don't  ?If their condition worsens, is the pt aware to call PCP or go to the Emergency Dept.? Yes ?Was the patient provided with contact information for the PCP's office or ED? Yes ?Was to pt encouraged to call back with questions or concerns? Yes  ?

## 2021-11-28 ENCOUNTER — Telehealth: Payer: Self-pay | Admitting: Internal Medicine

## 2021-11-28 NOTE — Telephone Encounter (Signed)
Verbal order given  

## 2021-11-28 NOTE — Telephone Encounter (Signed)
Home Health Verbal Orders - Caller/Agency: [facility] ? ?Callback Number: [phone] ? ?Requesting OT/PT/Skilled Nursing/Social Work/Speech Therapy: PT ? ?Frequency: Evaluation  ?

## 2021-12-01 ENCOUNTER — Telehealth: Payer: Self-pay | Admitting: Urology

## 2021-12-01 NOTE — Telephone Encounter (Signed)
PT's SO, Jeneen Rinks called asking if he could take pt's catheter out, because he couldn't get pt dressed.  He was told not to take catheter out, to keep appt for Thursday and come to office at 8:30. ?

## 2021-12-03 ENCOUNTER — Other Ambulatory Visit: Payer: Self-pay | Admitting: *Deleted

## 2021-12-03 ENCOUNTER — Other Ambulatory Visit: Payer: Self-pay

## 2021-12-03 NOTE — Patient Outreach (Cosign Needed)
Care Coordination ? ?12/03/2021 ? ?Kristina Reed ?[birth year] ?[id number] ? ?Kristina Reed was referred to the [facility] Managed Care High Risk team for assistance with care coordination and care management services. Care coordination/care management services as part of the Medicaid benefit was offered to the patient today. The patient declined assistance offered today.  ? ? ?Plan: The Medicaid Managed Care High Risk team is available at any time in the future to assist with care coordination/care management services upon referral.  ? ?Lurena Joiner RN, BSN ?[place] ?RN Care Coordinator ? ? ? ?

## 2021-12-03 NOTE — Patient Instructions (Signed)
Dear Ms. Smisek, ? ?Thank you for taking time to speak with me today about care coordination and care management services available to you at no cost as part of your Medicaid benefit. These services are voluntary. Our team is available to provide assistance regarding your health care needs at any time. Please do not hesitate to reach out to me if we can be of service to you at any time in the future.  ? ?Lurena Joiner RN, BSN ?[place] ?RN Care Coordinator ?  ?

## 2021-12-04 ENCOUNTER — Ambulatory Visit: Payer: Medicaid Other | Admitting: Urology

## 2021-12-04 ENCOUNTER — Encounter: Payer: Self-pay | Admitting: Urology

## 2021-12-04 ENCOUNTER — Ambulatory Visit: Payer: Medicaid Other | Admitting: *Deleted

## 2021-12-04 ENCOUNTER — Ambulatory Visit (INDEPENDENT_AMBULATORY_CARE_PROVIDER_SITE_OTHER): Payer: Medicaid Other | Admitting: Urology

## 2021-12-04 ENCOUNTER — Other Ambulatory Visit: Payer: Self-pay

## 2021-12-04 VITALS — BP 138/78 | HR 88 | Ht 61.0 in | Wt 174.0 lb

## 2021-12-04 DIAGNOSIS — N361 Urethral diverticulum: Secondary | ICD-10-CM | POA: Diagnosis not present

## 2021-12-04 DIAGNOSIS — R339 Retention of urine, unspecified: Secondary | ICD-10-CM | POA: Diagnosis not present

## 2021-12-04 DIAGNOSIS — Z7689 Persons encountering health services in other specified circumstances: Secondary | ICD-10-CM | POA: Diagnosis not present

## 2021-12-04 MED ORDER — SULFAMETHOXAZOLE-TRIMETHOPRIM 800-160 MG PO TABS
1.0000 | ORAL_TABLET | Freq: Once | ORAL | Status: AC
  Administered 2021-12-04: 1 via ORAL

## 2021-12-04 NOTE — Progress Notes (Signed)
Catheter Removal ? ?Patient is present today for a catheter removal.  63m of water was drained from the balloon. A 14FR foley cath was removed from the bladder no complications were noted . Patient tolerated well. ? ?Performed by: RVerlene Mayer CMA ? ?Follow up/ Additional notes: Return this afternoon for PVR ? ?

## 2021-12-04 NOTE — Progress Notes (Signed)
? ?  12/04/2021 ?5:09 PM  ? ?Kristina Reed ?31-Dec-1965 ?[id number] ? ?Reason for visit: Urinary retention ? ?HPI: ?Patient with long history of urinary symptoms who I recently saw on 11/20/2021 and she reported some gross hematuria.  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 ? ?She recently had a knee surgery and developed postoperative urinary retention requiring Foley catheter.  Her Foley was removed this morning and she has been able to void spontaneously, and PVR this afternoon is normal at 4 mL.  Bactrim was given for prophylaxis. ? ?I recommended keeping her scheduled follow-up for cystoscopy in 2 weeks for completion of her gross hematuria work-up.  She has had some overactive urinary symptoms of some urgency and frequency, but I am hesitant to consider OAB medications with her history of retention in the past, as well as recently.  Behavioral strategies discussed regarding bladder irritants. ? ?Billey Co, MD ? ?[facility] ?[address] ?[place], [place] [address] ?[phone] ? ?

## 2021-12-04 NOTE — Patient Instructions (Signed)

## 2021-12-09 DIAGNOSIS — M1711 Unilateral primary osteoarthritis, right knee: Secondary | ICD-10-CM | POA: Diagnosis not present

## 2021-12-09 DIAGNOSIS — Z7689 Persons encountering health services in other specified circumstances: Secondary | ICD-10-CM | POA: Diagnosis not present

## 2021-12-09 DIAGNOSIS — Z96659 Presence of unspecified artificial knee joint: Secondary | ICD-10-CM | POA: Diagnosis not present

## 2021-12-13 DIAGNOSIS — Z419 Encounter for procedure for purposes other than remedying health state, unspecified: Secondary | ICD-10-CM | POA: Diagnosis not present

## 2021-12-15 ENCOUNTER — Telehealth: Payer: Self-pay | Admitting: Gastroenterology

## 2021-12-15 MED ORDER — OMEPRAZOLE 40 MG PO CPDR: 40.0000 mg | DELAYED_RELEASE_CAPSULE | Freq: Every day | ORAL | 0 refills | Status: DC

## 2021-12-15 NOTE — Telephone Encounter (Signed)
Sent medication to the pharmacy per Dr. Marius Ditch note after 3 months change to once a day. Sent medication for once a day  ?

## 2021-12-15 NOTE — Addendum Note (Signed)
Addended by: Ulyess Blossom L on: 12/15/2021 11:58 AM ? ? Modules accepted: Orders ? ?

## 2021-12-15 NOTE — Telephone Encounter (Signed)
Patient states that she needs a refill on omeprazole. Patient requests it be sent to [facility] on [address]. ?

## 2021-12-24 ENCOUNTER — Ambulatory Visit (INDEPENDENT_AMBULATORY_CARE_PROVIDER_SITE_OTHER): Payer: Medicaid Other | Admitting: Urology

## 2021-12-24 VITALS — BP 138/94 | HR 98 | Ht 61.5 in | Wt 173.0 lb

## 2021-12-24 DIAGNOSIS — R319 Hematuria, unspecified: Secondary | ICD-10-CM | POA: Diagnosis not present

## 2021-12-24 DIAGNOSIS — Z7689 Persons encountering health services in other specified circumstances: Secondary | ICD-10-CM | POA: Diagnosis not present

## 2021-12-24 MED ORDER — SULFAMETHOXAZOLE-TRIMETHOPRIM 800-160 MG PO TABS
1.0000 | ORAL_TABLET | Freq: Once | ORAL | Status: AC
  Administered 2021-12-24: 1 via ORAL

## 2021-12-24 NOTE — Progress Notes (Signed)
Cystoscopy Procedure Note: ? ?Indication: Hematuria ? ?Bactrim given for prophylaxis ? ?After informed consent and discussion of the procedure and its risks, Kristina Reed was positioned and prepped in the standard fashion. Cystoscopy was performed with a flexible cystoscope. The urethra, bladder neck and entire bladder was visualized in a standard fashion.The ureteral orifices were visualized in their normal location and orientation.  Bladder mucosa grossly normal throughout with no suspicious lesions, no abnormalities on retroflexion.  Careful pullback ureteroscopy with no definite evidence of diverticulum ostium ? 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 ?Findings: ?Normal cystoscopy ? ?Assessment and Plan: ?RTC 1 year PVR and symptom check ? ?Nickolas Madrid, MD ?12/24/2021  ? ?

## 2022-01-06 DIAGNOSIS — Z96651 Presence of right artificial knee joint: Secondary | ICD-10-CM | POA: Diagnosis not present

## 2022-01-06 DIAGNOSIS — Z7689 Persons encountering health services in other specified circumstances: Secondary | ICD-10-CM | POA: Diagnosis not present

## 2022-01-10 ENCOUNTER — Other Ambulatory Visit: Payer: Self-pay | Admitting: Family Medicine

## 2022-01-10 DIAGNOSIS — F331 Major depressive disorder, recurrent, moderate: Secondary | ICD-10-CM

## 2022-01-12 DIAGNOSIS — Z419 Encounter for procedure for purposes other than remedying health state, unspecified: Secondary | ICD-10-CM | POA: Diagnosis not present

## 2022-01-30 DIAGNOSIS — Z7689 Persons encountering health services in other specified circumstances: Secondary | ICD-10-CM | POA: Diagnosis not present

## 2022-02-04 DIAGNOSIS — Z96651 Presence of right artificial knee joint: Secondary | ICD-10-CM | POA: Diagnosis not present

## 2022-02-04 DIAGNOSIS — M1712 Unilateral primary osteoarthritis, left knee: Secondary | ICD-10-CM | POA: Diagnosis not present

## 2022-02-04 DIAGNOSIS — Z7689 Persons encountering health services in other specified circumstances: Secondary | ICD-10-CM | POA: Diagnosis not present

## 2022-02-12 DIAGNOSIS — Z419 Encounter for procedure for purposes other than remedying health state, unspecified: Secondary | ICD-10-CM | POA: Diagnosis not present

## 2022-02-24 DIAGNOSIS — Z7689 Persons encountering health services in other specified circumstances: Secondary | ICD-10-CM | POA: Diagnosis not present

## 2022-02-24 DIAGNOSIS — M7541 Impingement syndrome of right shoulder: Secondary | ICD-10-CM | POA: Diagnosis not present

## 2022-02-27 ENCOUNTER — Ambulatory Visit: Payer: Self-pay

## 2022-02-27 NOTE — Telephone Encounter (Signed)
  Chief Complaint: High BP readings Symptoms: None Frequency: yesterday and today Pertinent Negatives: Patient denies HA, chest pain, sob, dizziness Disposition: '[]'$ ED /'[]'$ Urgent Care (no appt availability in office) / '[x]'$ Appointment(In office/virtual)/ '[]'$  [place] Virtual Care/ '[]'$ Home Care/ '[]'$ Refused Recommended Disposition /'[]'$ [place] Mobile Bus/ '[]'$  Follow-up with PCP Additional Notes: PT is unable to take appt today d/t transportation issues. Pt made OV appt for Tuesday. PT will seek immediate care if needed.   Summary: BP advice   Pt is calling to report that her BP is 153/90 today and 132/90. Pt reports no sx      Reason for Disposition  Systolic BP  >= 361 OR Diastolic >= 443  Answer Assessment - Initial Assessment Questions 1. BLOOD PRESSURE: "What is the blood pressure?" "Did you take at least two measurements 5 minutes apart?"     no 2. ONSET: "When did you take your blood pressure?"     A bit ago - 153/90 3. HOW: "How did you obtain the blood pressure?" (e.g., visiting nurse, automatic home BP monitor)     Automatic home 4. HISTORY: "Do you have a history of high blood pressure?"     Unknown 5. MEDICATIONS: "Are you taking any medications for blood pressure?" "Have you missed any doses recently?"     no 6. OTHER SYMPTOMS: "Do you have any symptoms?" (e.g., headache, chest pain, blurred vision, difficulty breathing, weakness)     no 7. PREGNANCY: "Is there any chance you are pregnant?" "When was your last menstrual period?"     na  Protocols used: Blood Pressure - High-A-AH

## 2022-02-27 NOTE — Telephone Encounter (Signed)
Patient aware and expressed compliance with advice.

## 2022-03-03 ENCOUNTER — Ambulatory Visit (INDEPENDENT_AMBULATORY_CARE_PROVIDER_SITE_OTHER): Payer: Medicaid Other | Admitting: Physician Assistant

## 2022-03-03 ENCOUNTER — Encounter: Payer: Self-pay | Admitting: Physician Assistant

## 2022-03-03 VITALS — BP 134/86 | HR 84 | Resp 16 | Ht 61.0 in | Wt 170.7 lb

## 2022-03-03 DIAGNOSIS — E669 Obesity, unspecified: Secondary | ICD-10-CM | POA: Diagnosis not present

## 2022-03-03 DIAGNOSIS — I10 Essential (primary) hypertension: Secondary | ICD-10-CM | POA: Diagnosis not present

## 2022-03-03 DIAGNOSIS — E039 Hypothyroidism, unspecified: Secondary | ICD-10-CM

## 2022-03-03 DIAGNOSIS — R030 Elevated blood-pressure reading, without diagnosis of hypertension: Secondary | ICD-10-CM | POA: Diagnosis not present

## 2022-03-03 DIAGNOSIS — Z7689 Persons encountering health services in other specified circumstances: Secondary | ICD-10-CM | POA: Diagnosis not present

## 2022-03-03 NOTE — Progress Notes (Signed)
elevated

## 2022-03-03 NOTE — Progress Notes (Signed)
Established Patient Office Visit  Name: Kristina Reed   MRN: [id number]    DOB: 1965/10/19   Date:03/03/2022  Today's Provider: Talitha Givens, MHS, PA-C Introduced myself to the patient as a PA-C and provided education on APPs in clinical practice.         Subjective  Chief Complaint  Chief Complaint  Patient presents with   Hypertension    HPI  States her BP is giving her a fit Reports she "hasn't been drinking alcohol, smoking or nothing"  States it will just "shoot up even if I'm just sitting there"  Reports recent results at home 153/93, pulse 101 and 132/98 and pulse 80  Reports her BP spikes were occurring when she was just walking around her house and sitting watching TV Reports she fell Sat after slipping on a wet floor. States she did not hit her head Denies previous history of her blood pressure being elevated in this way.  Reports when her BP is elevated she noticed her hands were shaking. Denies lightheadedness or dizziness.     Patient Active Problem List   Diagnosis Date Noted   S/P TKR (total knee replacement) 11/25/2021   S/P TKR (total knee replacement) using cement, right 11/24/2021   Erosive esophagitis    Gastric erosion determined by endoscopy    Duodenal erythema    History of colonic polyps    Erythema of rectum    Chronic pain syndrome 11/26/2020   Bilateral primary osteoarthritis of knee 11/26/2020   Grade I internal hemorrhoids 11/16/2019   Genetic testing 04/20/2019   Family history of breast cancer    History of cervical cancer    Mastalgia 03/17/2019   Family hx-breast malignancy 12/16/2018   Dysphagia    Essential hypertension 01/19/2018   Obesity (BMI 30.0-34.9) 71/24/5809   Eosinophilic esophagitis 98/33/8250   Apophysitis 08/26/2017   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   Pain in joint, multiple sites 07/05/2017   Osteoarthritis of knee 53/97/6734   Lichen sclerosus et atrophicus 04/27/2017   Headache 04/27/2017   Headache  disorder 02/17/2017   Post-concussion headache 02/17/2017   Medication monitoring encounter 09/21/2016   Hematuria 04/23/2016   Hx of cervical cancer 01/30/2016   Menopause 01/30/2016   Status post bilateral breast biopsy 01/07/2016   Rectal bleeding 04/30/2015   Chronic pain of both knees 07/23/2014   Depression, major, recurrent, moderate ([place]) 02/02/2014   Adult hypothyroidism 02/02/2014   Obstructive apnea 02/02/2014   Anxiety and depression 02/02/2014   Incomplete bladder emptying 12/12/2012   Tenosynovitis of foot 05/30/2012   Benign neoplasm of kidney 05/13/2012   Urge incontinence 05/13/2012   FOM (frequency of micturition) 05/13/2012   Chronic pain of right hand 05/05/2012   Tarsal tunnel syndrome 03/03/2012    Past Surgical History:  Procedure Laterality Date   ABDOMINAL HYSTERECTOMY     partial hysterectomy   BREAST BIOPSY Bilateral 01/01/2016   Fibroadenoma   COLONOSCOPY WITH PROPOFOL N/A 06/27/2015   Procedure: COLONOSCOPY WITH PROPOFOL;  Surgeon: Josefine Class, MD;  Location: [facility] ENDOSCOPY;  Service: Endoscopy;  Laterality: N/A;   COLONOSCOPY WITH PROPOFOL N/A 09/23/2017   Procedure: COLONOSCOPY WITH PROPOFOL;  Surgeon: Jonathon Bellows, MD;  Location: [facility] ENDOSCOPY;  Service: Gastroenterology;  Laterality: N/A;   COLONOSCOPY WITH PROPOFOL N/A 06/09/2021   Procedure: COLONOSCOPY WITH PROPOFOL;  Surgeon: Lin Landsman, MD;  Location: [facility] ENDOSCOPY;  Service: Gastroenterology;  Laterality: N/A;   ESOPHAGOGASTRODUODENOSCOPY (EGD) WITH PROPOFOL N/A  07/22/2017   Procedure: ESOPHAGOGASTRODUODENOSCOPY (EGD) WITH PROPOFOL;  Surgeon: Jonathon Bellows, MD;  Location: [facility] ENDOSCOPY;  Service: Gastroenterology;  Laterality: N/A;   ESOPHAGOGASTRODUODENOSCOPY (EGD) WITH PROPOFOL N/A 03/31/2018   Procedure: ESOPHAGOGASTRODUODENOSCOPY (EGD) WITH PROPOFOL;  Surgeon: Lin Landsman, MD;  Location: [facility] ENDOSCOPY;  Service: Gastroenterology;  Laterality: N/A;    ESOPHAGOGASTRODUODENOSCOPY (EGD) WITH PROPOFOL N/A 08/25/2021   Procedure: ESOPHAGOGASTRODUODENOSCOPY (EGD) WITH PROPOFOL;  Surgeon: Lin Landsman, MD;  Location: [facility] ENDOSCOPY;  Service: Gastroenterology;  Laterality: N/A;   KNEE ARTHROSCOPY Right 07/22/2016   Procedure: ARTHROSCOPY KNEE debridement microfracture;  Surgeon: Leanor Kail, MD;  Location: [facility] ORS;  Service: Orthopedics;  Laterality: Right;   KNEE ARTHROSCOPY WITH MEDIAL MENISECTOMY Left 11/10/2017   Procedure: KNEE ARTHROSCOPY WITH PARTIAL MEDIAL MENISECTOMY&patella femoral debridement, & ablation;  Surgeon: Leanor Kail, MD;  Location: [facility] ORS;  Service: Orthopedics;  Laterality: Left;   TOTAL KNEE ARTHROPLASTY Right 11/24/2021   Procedure: TOTAL KNEE ARTHROPLASTY;  Surgeon: Lovell Sheehan, MD;  Location: [facility] ORS;  Service: Orthopedics;  Laterality: Right;   TUBAL LIGATION      Family History  Problem Relation Age of Onset   Diabetes Mother    Lung cancer Father 61   Breast cancer Maternal Grandmother 30   Cancer Paternal Grandmother        cancer?   Breast cancer Maternal Aunt        80s   Cirrhosis Cousin    Breast cancer Cousin    Breast cancer Other        x4    Social History   Tobacco Use   Smoking status: Never   Smokeless tobacco: Never  Substance Use Topics   Alcohol use: Not Currently     Current Outpatient Medications:    aspirin 81 MG chewable tablet, Chew 1 tablet (81 mg total) by mouth 2 (two) times daily., Disp: 60 tablet, Rfl: 0   cloNIDine (CATAPRES) 0.2 MG tablet, Take 1 tablet (0.2 mg total) by mouth 2 (two) times daily., Disp: 90 tablet, Rfl: 3   docusate sodium (COLACE) 100 MG capsule, Take 1 capsule (100 mg total) by mouth 2 (two) times daily., Disp: 30 capsule, Rfl: 0   estradiol (ESTRACE) 1 MG tablet, Take 1 mg by mouth daily., Disp: , Rfl:    FLUoxetine (PROZAC) 20 MG capsule, Take 1 capsule by mouth once daily, Disp: 90 capsule, Rfl: 0   levothyroxine (SYNTHROID) 75  MCG tablet, Take 1 tablet (75 mcg total) by mouth daily before breakfast., Disp: 90 tablet, Rfl: 1   omeprazole (PRILOSEC) 40 MG capsule, Take 1 capsule (40 mg total) by mouth daily before breakfast., Disp: 90 capsule, Rfl: 0  Allergies  Allergen Reactions   Prednisone Nausea And Vomiting   Methylprednisolone Palpitations    Increased heart rate   Percocet [Oxycodone-Acetaminophen] Palpitations    I personally reviewed active problem list, medication list, allergies, notes from last encounter, lab results with the patient/caregiver today.   Review of Systems  Constitutional:  Negative for chills and diaphoresis.  Eyes:  Negative for blurred vision and double vision.  Respiratory:  Negative for shortness of breath.   Cardiovascular:  Negative for chest pain, palpitations and leg swelling.  Gastrointestinal:  Negative for nausea and vomiting.  Neurological:  Positive for headaches. Negative for dizziness, tingling, tremors and weakness.      Objective  Vitals:   03/03/22 1130  BP: 134/86  Pulse: 84  Resp: 16  SpO2: 99%  Weight: 170 lb 11.2  oz (77.4 kg)  Height: '5\' 1"'$  (1.549 m)    Body mass index is 32.25 kg/m.  Physical Exam Vitals reviewed.  Constitutional:      General: She is awake.     Appearance: Normal appearance. She is well-developed and well-groomed. She is obese.  HENT:     Head: Normocephalic and atraumatic.  Cardiovascular:     Rate and Rhythm: Normal rate and regular rhythm.     Pulses:          Radial pulses are 1+ on the right side and 1+ on the left side.     Heart sounds: Normal heart sounds.  Pulmonary:     Effort: Pulmonary effort is normal.     Breath sounds: Normal breath sounds and air entry. No decreased breath sounds, wheezing, rhonchi or rales.  Musculoskeletal:     Right lower leg: No edema.     Left lower leg: No edema.  Neurological:     Mental Status: She is alert.  Psychiatric:        Behavior: Behavior is cooperative.       No results found for this or any previous visit (from the past 2160 hour(s)).   PHQ2/9:    03/03/2022   11:32 AM 11/14/2021    8:06 AM 10/31/2021    1:24 PM 10/15/2021   10:53 AM 09/16/2021    9:32 AM  Depression screen PHQ 2/9  Decreased Interest 0 0 0 0 0  Down, Depressed, Hopeless 0 0 0 0 0  PHQ - 2 Score 0 0 0 0 0  Altered sleeping  0 0 0   Tired, decreased energy  0 0 0   Change in appetite  0 0 0   Feeling bad or failure about yourself   0 0 0   Trouble concentrating  0 0 0   Moving slowly or fidgety/restless  0 0 0   Suicidal thoughts  0 0 0   PHQ-9 Score  0 0 0   Difficult doing work/chores  Not difficult at all Not difficult at all Not difficult at all       Fall Risk:    03/03/2022   11:31 AM 11/14/2021    8:06 AM 10/31/2021    1:24 PM 10/15/2021   10:53 AM 09/16/2021    9:25 AM  Fall Risk   Falls in the past year? 1 0 0 0 0  Number falls in past yr: 0 0 0 0 0  Injury with Fall? 0 0 0 0 0  Risk for fall due to : No Fall Risks No Fall Risks   No Fall Risks  Follow up Falls prevention discussed Falls prevention discussed  Falls evaluation completed Falls prevention discussed      Functional Status Survey: Is the patient deaf or have difficulty hearing?: No Does the patient have difficulty seeing, even when wearing glasses/contacts?: No Does the patient have difficulty concentrating, remembering, or making decisions?: No Does the patient have difficulty walking or climbing stairs?: Yes Does the patient have difficulty dressing or bathing?: No Does the patient have difficulty doing errands alone such as visiting a doctor's office or shopping?: No    Assessment & Plan  Problem List Items Addressed This Visit    Problem List Items Addressed This Visit       Cardiovascular and Mediastinum   Essential hypertension    Unsure of chronicity  Patient has dx of essential HTN but I am unsure of management after chart  review She appears to be taking Clonidine  0.2 mg PO BID for this but does not appear to have chronic follow up or monitoring Advised patient to keep BP record for the next 2-4 weeks and bring record to follow up to determine if BP is staying elevated vs transient elevations  Continue Clonidine for now but would recommend changing to first line medications for more adequate control.  Lab results to further dictate management as needed Follow up in 4 weeks to assess BP record and discuss new management plan       Relevant Orders   CBC w/Diff/Platelet   TSH   COMPLETE METABOLIC PANEL WITH GFR     Other   Obesity (BMI 30.0-34.9) - Primary    Chronic, ongoing concern Likely contributory to HTN  Unable to find recent A1c result Will check today- results to dictate further management  Recommend increased physical activity to assist with weight and HTN  Follow up in 3 months        Relevant Orders   HgB A1c     Return in about 4 weeks (around 03/31/2022) for elevated BP follow up .   I, Kaiyan Luczak E Gahel Safley, PA-C, have reviewed all documentation for this visit. The documentation on 03/03/22 for the exam, diagnosis, procedures, and orders are all accurate and complete.   Talitha Givens, MHS, PA-C [facility]

## 2022-03-03 NOTE — Assessment & Plan Note (Signed)
Unsure of chronicity  Patient has dx of essential HTN but I am unsure of management after chart review She appears to be taking Clonidine 0.2 mg PO BID for this but does not appear to have chronic follow up or monitoring Advised patient to keep BP record for the next 2-4 weeks and bring record to follow up to determine if BP is staying elevated vs transient elevations  Continue Clonidine for now but would recommend changing to first line medications for more adequate control.  Lab results to further dictate management as needed Follow up in 4 weeks to assess BP record and discuss new management plan

## 2022-03-03 NOTE — Patient Instructions (Addendum)
Your blood pressure was mildly elevated today.   If possible please take it at home using an electronic blood pressure cuff for the upper arm Record your blood pressure once per day and bring them back with you to your apt so we can make sure you are not developing high blood pressure.   Incorporating a minimum of 150 minutes (20-30 minutes per day) of moderate intensity physical activity can help improve your heart health and reduce the chances of high blood pressure and other cardiovascular risks. Incorporating a heart healthy diet can also help reduce the chances of heart attack and high cholesterol.  Continue taking your current medications until your follow up apt.   It was nice to meet you and I appreciate the opportunity to be involved in your care

## 2022-03-03 NOTE — Assessment & Plan Note (Signed)
Chronic, ongoing concern Likely contributory to HTN  Unable to find recent A1c result Will check today- results to dictate further management  Recommend increased physical activity to assist with weight and HTN  Follow up in 3 months

## 2022-03-04 LAB — COMPLETE METABOLIC PANEL WITH GFR
AG Ratio: 1.4 (calc) (ref 1.0–2.5)
ALT: 16 U/L (ref 6–29)
AST: 14 U/L (ref 10–35)
Albumin: 4.1 g/dL (ref 3.6–5.1)
Alkaline phosphatase (APISO): 75 U/L (ref 37–153)
BUN: 16 mg/dL (ref 7–25)
CO2: 27 mmol/L (ref 20–32)
Calcium: 9.3 mg/dL (ref 8.6–10.4)
Chloride: 100 mmol/L (ref 98–110)
Creat: 0.87 mg/dL (ref 0.50–1.03)
Globulin: 2.9 g/dL (calc) (ref 1.9–3.7)
Glucose, Bld: 66 mg/dL (ref 65–99)
Potassium: 4.2 mmol/L (ref 3.5–5.3)
Sodium: 137 mmol/L (ref 135–146)
Total Bilirubin: 0.3 mg/dL (ref 0.2–1.2)
Total Protein: 7 g/dL (ref 6.1–8.1)
eGFR: 80 mL/min/{1.73_m2} (ref >=60)

## 2022-03-04 LAB — CBC WITH DIFFERENTIAL/PLATELET
Absolute Monocytes: 698 cells/uL (ref 200–950)
Basophils Absolute: 49 cells/uL (ref 0–200)
Basophils Relative: 0.5 %
Eosinophils Absolute: 378 cells/uL (ref 15–500)
Eosinophils Relative: 3.9 %
HCT: 40 % (ref 35.0–45.0)
Hemoglobin: 13.1 g/dL (ref 11.7–15.5)
Lymphs Abs: 2803 cells/uL (ref 850–3900)
MCH: 27.5 pg (ref 27.0–33.0)
MCHC: 32.8 g/dL (ref 32.0–36.0)
MCV: 83.9 fL (ref 80.0–100.0)
MPV: 9.8 fL (ref 7.5–12.5)
Monocytes Relative: 7.2 %
Neutro Abs: 5772 cells/uL (ref 1500–7800)
Neutrophils Relative %: 59.5 %
Platelets: 293 10*3/uL (ref 140–400)
RBC: 4.77 10*6/uL (ref 3.80–5.10)
RDW: 14.4 % (ref 11.0–15.0)
Total Lymphocyte: 28.9 %
WBC: 9.7 10*3/uL (ref 3.8–10.8)

## 2022-03-04 LAB — HEMOGLOBIN A1C
Hgb A1c MFr Bld: 5.8 % of total Hgb — ABNORMAL HIGH (ref <5.7)
Mean Plasma Glucose: 120 mg/dL
eAG (mmol/L): 6.6 mmol/L

## 2022-03-04 LAB — TSH: TSH: 8.99 mIU/L — ABNORMAL HIGH

## 2022-03-11 DIAGNOSIS — Z7689 Persons encountering health services in other specified circumstances: Secondary | ICD-10-CM | POA: Diagnosis not present

## 2022-03-11 DIAGNOSIS — E039 Hypothyroidism, unspecified: Secondary | ICD-10-CM | POA: Diagnosis not present

## 2022-03-11 LAB — TSH: TSH: 6.82 mIU/L — ABNORMAL HIGH

## 2022-03-11 LAB — T3, FREE: T3, Free: 2.8 pg/mL (ref 2.3–4.2)

## 2022-03-11 LAB — T4, FREE: Free T4: 1.1 ng/dL (ref 0.8–1.8)

## 2022-03-13 ENCOUNTER — Other Ambulatory Visit: Payer: Self-pay

## 2022-03-13 DIAGNOSIS — E039 Hypothyroidism, unspecified: Secondary | ICD-10-CM

## 2022-03-13 MED ORDER — LEVOTHYROXINE SODIUM 75 MCG PO TABS: 75.0000 ug | ORAL_TABLET | Freq: Every day | ORAL | 0 refills | Status: DC

## 2022-03-13 NOTE — Telephone Encounter (Signed)
Pt needs refill according to last lab note result

## 2022-03-13 NOTE — Telephone Encounter (Signed)
Copied from [place] [id number]. Topic: General - Other >> Mar 13, 2022  1:37 PM [place] B wrote:   Reason for FMB:[id number] called states she was told that med for her thyroid would be sent in yesterday. No notes in system about this. Please call back.  [facility] ([address]), [place] - [address]  Phone:  [phone] Fax:  [phone]

## 2022-03-14 DIAGNOSIS — Z419 Encounter for procedure for purposes other than remedying health state, unspecified: Secondary | ICD-10-CM | POA: Diagnosis not present

## 2022-03-18 ENCOUNTER — Other Ambulatory Visit: Payer: Self-pay | Admitting: Internal Medicine

## 2022-03-18 ENCOUNTER — Other Ambulatory Visit: Payer: Self-pay | Admitting: Obstetrics and Gynecology

## 2022-03-18 DIAGNOSIS — Z1231 Encounter for screening mammogram for malignant neoplasm of breast: Secondary | ICD-10-CM

## 2022-03-24 DIAGNOSIS — Z7689 Persons encountering health services in other specified circumstances: Secondary | ICD-10-CM | POA: Diagnosis not present

## 2022-03-24 DIAGNOSIS — Z96651 Presence of right artificial knee joint: Secondary | ICD-10-CM | POA: Diagnosis not present

## 2022-03-31 ENCOUNTER — Ambulatory Visit: Payer: Medicaid Other | Admitting: Internal Medicine

## 2022-04-01 ENCOUNTER — Ambulatory Visit
Admission: RE | Admit: 2022-04-01 | Discharge: 2022-04-01 | Disposition: A | Discharge status: Home / Self Care | Payer: Medicaid Other | Source: Ambulatory Visit | Attending: Nurse Practitioner | Admitting: Nurse Practitioner

## 2022-04-01 ENCOUNTER — Other Ambulatory Visit: Payer: Self-pay

## 2022-04-01 ENCOUNTER — Ambulatory Visit (INDEPENDENT_AMBULATORY_CARE_PROVIDER_SITE_OTHER): Payer: Medicaid Other | Admitting: Nurse Practitioner

## 2022-04-01 ENCOUNTER — Ambulatory Visit
Admission: RE | Admit: 2022-04-01 | Discharge: 2022-04-01 | Disposition: A | Discharge status: Home / Self Care | Payer: Medicaid Other | Attending: Nurse Practitioner | Admitting: Nurse Practitioner

## 2022-04-01 ENCOUNTER — Encounter: Payer: Self-pay | Admitting: Nurse Practitioner

## 2022-04-01 ENCOUNTER — Ambulatory Visit: Payer: Self-pay | Admitting: *Deleted

## 2022-04-01 VITALS — BP 112/76 | HR 97 | Temp 98.0°F | Resp 14 | Ht 61.0 in | Wt 171.9 lb

## 2022-04-01 DIAGNOSIS — R1032 Left lower quadrant pain: Secondary | ICD-10-CM | POA: Diagnosis not present

## 2022-04-01 MED ORDER — DICYCLOMINE HCL 20 MG PO TABS: 20.0000 mg | ORAL_TABLET | Freq: Three times a day (TID) | ORAL | 0 refills | Status: DC | PRN

## 2022-04-01 NOTE — Telephone Encounter (Signed)
Pt.notified

## 2022-04-01 NOTE — Telephone Encounter (Signed)
  Chief Complaint: medication request-pain Symptoms: patient was seen in office today for abdominal pain and is requesting something for pain Frequency: started today Pertinent Negatives: Patient denies   Disposition: '[]'$ ED /'[]'$ Urgent Care (no appt availability in office) / '[]'$ Appointment(In office/virtual)/ '[]'$  [place] Virtual Care/ '[]'$ Home Care/ '[]'$ Refused Recommended Disposition /'[]'$ [place] Mobile Bus/ '[x]'$  Follow-up with PCP Additional Notes: Medication request

## 2022-04-01 NOTE — Telephone Encounter (Signed)
Reason for Disposition  Prescription request for new medicine (not a refill)  Answer Assessment - Initial Assessment Questions 1. LOCATION: "Where does it hurt?"      Left side pain 2. RADIATION: "Does the pain shoot anywhere else?" (e.g., chest, back)     No radiation 3. ONSET: "When did the pain begin?" (e.g., minutes, hours or days ago)      This morning 4. SUDDEN: "Gradual or sudden onset?"     sudden 5. PATTERN "Does the pain come and go, or is it constant?"    - If it comes and goes: "How long does it last?" "Do you have pain now?"     (Note: Comes and goes means the pain is intermittent. It goes away completely between bouts.)    - If constant: "Is it getting better, staying the same, or getting worse?"      (Note: Constant means the pain never goes away completely; most serious pain is constant and gets worse.)      constant 6. SEVERITY: "How bad is the pain?"  (e.g., Scale 1-10; mild, moderate, or severe)    - MILD (1-3): Doesn't interfere with normal activities, abdomen soft and not tender to touch.     - MODERATE (4-7): Interferes with normal activities or awakens from sleep, abdomen tender to touch.     - SEVERE (8-10): Excruciating pain, doubled over, unable to do any normal activities.       Worse with movement- severe 7. RECURRENT SYMPTOM: "Have you ever had this type of stomach pain before?" If Yes, ask: "When was the last time?" and "What happened that time?"      no 8. CAUSE: "What do you think is causing the stomach pain?"     unsure 9. RELIEVING/AGGRAVATING FACTORS: "What makes it better or worse?" (e.g., antacids, bending or twisting motion, bowel movement)     *No Answer* 10. OTHER SYMPTOMS: "Do you have any other symptoms?" (e.g., back pain, diarrhea, fever, urination pain, vomiting)       no 11. PREGNANCY: "Is there any chance you are pregnant?" "When was your last menstrual period?"       *No Answer*  Answer Assessment - Initial Assessment Questions 1. NAME  of MEDICINE: "What medicine(s) are you calling about?"     Medication request- patient requesting medication for pain 2. QUESTION: "What is your question?" (e.g., double dose of medicine, side effect)     Patient is requesting medication for pain until results come back and she can be treated 3. PRESCRIBER: "Who prescribed the medicine?" Reason: if prescribed by specialist, call should be referred to that group.     Pender 4. SYMPTOMS: "Do you have any symptoms?" If Yes, ask: "What symptoms are you having?"  "How bad are the symptoms (e.g., mild, moderate, severe)     Left sided abdominal pain 5. PREGNANCY:  "Is there any chance that you are pregnant?" "When was your last menstrual period?"  Protocols used: Abdominal Pain - Female-A-AH, Medication Question Call-A-AH

## 2022-04-01 NOTE — Progress Notes (Signed)
BP 112/76   Pulse 97   Temp 98 F (36.7 C) (Oral)   Resp 14   Ht _0  (1.549 m)   Wt 171 lb 14.4 oz (78 kg)   LMP 05/09/1993 (Approximate)   SpO2 98%   BMI 32.48 kg/m    Subjective:    Patient ID: Kristina Reed, female    DOB: November 10, 1965, 56 y.o.   MRN: [id number]  HPI: Kristina Reed is a 56 y.o. female  Chief Complaint  Patient presents with   Abdominal Pain    Left lower quadrant   Abdominal pain: Patient reports left lower quadrant sharp pain that started this morning.  She says she has never had this pain before.  She says the pain hurts more when she is walking.  She denies any increase in pain with eating. She denies fever, nausea,or diarrhea.  Last BM was four days ago.   Patient states that is not normal for her.  Patient states she can go a day or 2 without having a bowel movement before has been longer than her normal.  Patient reports she was taking hydrocodone for her knee pain after surgery. Patient denies any urinary complaints.  Discussed with patient that we will start with get labs, urine sample and KUB.  Discussed with patient if pain increases or she develops a fever she needs to go to the emergency department.   Relevant past medical, surgical, family and social history reviewed and updated as indicated. Interim medical history since our last visit reviewed. Allergies and medications reviewed and updated.  Review of Systems  Constitutional: Negative for fever or weight change.  Respiratory: Negative for cough and shortness of breath.   Cardiovascular: Negative for chest pain or palpitations.  Gastrointestinal: positive for abdominal pain, constipation  Musculoskeletal: Negative for gait problem or joint swelling.  Skin: Negative for rash.  Neurological: Negative for dizziness or headache.  No other specific complaints in a complete review of systems (except as listed in HPI above).      Objective:    BP 112/76   Pulse 97   Temp 98 F (36.7 C) (Oral)    Resp 14   Ht _1  (1.549 m)   Wt 171 lb 14.4 oz (78 kg)   LMP 05/09/1993 (Approximate)   SpO2 98%   BMI 32.48 kg/m   Wt Readings from Last 3 Encounters:  04/01/22 171 lb 14.4 oz (78 kg)  03/03/22 170 lb 11.2 oz (77.4 kg)  12/24/21 173 lb (78.5 kg)    Physical Exam  Constitutional: Patient appears well-developed and well-nourished. Obese  No distress.  HEENT: head atraumatic, normocephalic, pupils equal and reactive to light,  neck supple Cardiovascular: Normal rate, regular rhythm and normal heart sounds.  No murmur heard. No BLE edema. Pulmonary/Chest: Effort normal and breath sounds normal. No respiratory distress. Abdominal: Soft.  Left lower quadrant tenderness Psychiatric: Patient has a normal mood and affect. behavior is normal. Judgment and thought content normal.   Results for orders placed or performed in visit on 03/03/22  CBC w/Diff/Platelet  Result Value Ref Range   WBC 9.7 3.8 - 10.8 Thousand/uL   RBC 4.77 3.80 - 5.10 Million/uL   Hemoglobin 13.1 11.7 - 15.5 g/dL   HCT 40.0 35.0 - 45.0 %   MCV 83.9 80.0 - 100.0 fL   MCH 27.5 27.0 - 33.0 pg   MCHC 32.8 32.0 - 36.0 g/dL   RDW 14.4 11.0 - 15.0 %   Platelets  293 140 - 400 Thousand/uL   MPV 9.8 7.5 - 12.5 fL   Neutro Abs 5,772 1,500 - 7,800 cells/uL   Lymphs Abs 2,803 850 - 3,900 cells/uL   Absolute Monocytes 698 200 - 950 cells/uL   Eosinophils Absolute 378 15 - 500 cells/uL   Basophils Absolute 49 0 - 200 cells/uL   Neutrophils Relative % 59.5 %   Total Lymphocyte 28.9 %   Monocytes Relative 7.2 %   Eosinophils Relative 3.9 %   Basophils Relative 0.5 %  TSH  Result Value Ref Range   TSH 8.99 (H) mIU/L  HgB A1c  Result Value Ref Range   Hgb A1c MFr Bld 5.8 (H) <5.7 % of total Hgb   Mean Plasma Glucose 120 mg/dL   eAG (mmol/L) 6.6 mmol/L  COMPLETE METABOLIC PANEL WITH GFR  Result Value Ref Range   Glucose, Bld 66 65 - 99 mg/dL   BUN 16 7 - 25 mg/dL   Creat 0.87 0.50 - 1.03 mg/dL   eGFR 80 > OR = 60  mL/min/1.59m   BUN/Creatinine Ratio NOT APPLICABLE 6 - 22 (calc)   Sodium 137 135 - 146 mmol/L   Potassium 4.2 3.5 - 5.3 mmol/L   Chloride 100 98 - 110 mmol/L   CO2 27 20 - 32 mmol/L   Calcium 9.3 8.6 - 10.4 mg/dL   Total Protein 7.0 6.1 - 8.1 g/dL   Albumin 4.1 3.6 - 5.1 g/dL   Globulin 2.9 1.9 - 3.7 g/dL (calc)   AG Ratio 1.4 1.0 - 2.5 (calc)   Total Bilirubin 0.3 0.2 - 1.2 mg/dL   Alkaline phosphatase (APISO) 75 37 - 153 U/L   AST 14 10 - 35 U/L   ALT 16 6 - 29 U/L  T3, free  Result Value Ref Range   T3, Free 2.8 2.3 - 4.2 pg/mL  T4, free  Result Value Ref Range   Free T4 1.1 0.8 - 1.8 ng/dL  TSH  Result Value Ref Range   TSH 6.82 (H) mIU/L      Assessment & Plan:   Problem List Items Addressed This Visit   None Visit Diagnoses     Left lower quadrant abdominal pain    -  Primary   We will get KUB, urine sample and lab work.  Discussed with patient possibly constipation.  If KUB does not show constipation will order CT scan.   Relevant Orders   CBC with Differential/Platelet   COMPLETE METABOLIC PANEL WITH GFR   POCT urinalysis dipstick   DG Abd 1 View        Follow up plan: Return if symptoms worsen or fail to improve.

## 2022-04-01 NOTE — Telephone Encounter (Signed)
Tylenol.

## 2022-04-02 ENCOUNTER — Other Ambulatory Visit: Payer: Self-pay

## 2022-04-02 ENCOUNTER — Ambulatory Visit (INDEPENDENT_AMBULATORY_CARE_PROVIDER_SITE_OTHER): Payer: Medicaid Other | Admitting: Nurse Practitioner

## 2022-04-02 ENCOUNTER — Encounter: Payer: Self-pay | Admitting: Nurse Practitioner

## 2022-04-02 VITALS — BP 128/72 | HR 94 | Temp 98.2°F | Resp 16 | Ht 61.0 in | Wt 170.7 lb

## 2022-04-02 DIAGNOSIS — R1032 Left lower quadrant pain: Secondary | ICD-10-CM

## 2022-04-02 LAB — COMPLETE METABOLIC PANEL WITH GFR
AG Ratio: 1.4 (calc) (ref 1.0–2.5)
ALT: 27 U/L (ref 6–29)
AST: 17 U/L (ref 10–35)
Albumin: 3.8 g/dL (ref 3.6–5.1)
Alkaline phosphatase (APISO): 85 U/L (ref 37–153)
BUN: 14 mg/dL (ref 7–25)
CO2: 26 mmol/L (ref 20–32)
Calcium: 8.8 mg/dL (ref 8.6–10.4)
Chloride: 100 mmol/L (ref 98–110)
Creat: 0.84 mg/dL (ref 0.50–1.03)
Globulin: 2.8 g/dL (calc) (ref 1.9–3.7)
Glucose, Bld: 88 mg/dL (ref 65–99)
Potassium: 4.1 mmol/L (ref 3.5–5.3)
Sodium: 138 mmol/L (ref 135–146)
Total Bilirubin: 0.2 mg/dL (ref 0.2–1.2)
Total Protein: 6.6 g/dL (ref 6.1–8.1)
eGFR: 83 mL/min/{1.73_m2} (ref >=60)

## 2022-04-02 LAB — CBC WITH DIFFERENTIAL/PLATELET
Absolute Monocytes: 592 cells/uL (ref 200–950)
Basophils Absolute: 56 cells/uL (ref 0–200)
Basophils Relative: 0.6 %
Eosinophils Absolute: 291 cells/uL (ref 15–500)
Eosinophils Relative: 3.1 %
HCT: 37 % (ref 35.0–45.0)
Hemoglobin: 12.3 g/dL (ref 11.7–15.5)
Lymphs Abs: 2557 cells/uL (ref 850–3900)
MCH: 28.2 pg (ref 27.0–33.0)
MCHC: 33.2 g/dL (ref 32.0–36.0)
MCV: 84.9 fL (ref 80.0–100.0)
MPV: 9.9 fL (ref 7.5–12.5)
Monocytes Relative: 6.3 %
Neutro Abs: 5903 cells/uL (ref 1500–7800)
Neutrophils Relative %: 62.8 %
Platelets: 292 10*3/uL (ref 140–400)
RBC: 4.36 10*6/uL (ref 3.80–5.10)
RDW: 14.2 % (ref 11.0–15.0)
Total Lymphocyte: 27.2 %
WBC: 9.4 10*3/uL (ref 3.8–10.8)

## 2022-04-02 LAB — POCT URINALYSIS DIPSTICK
Bilirubin, UA: NEGATIVE
Glucose, UA: NEGATIVE
Ketones, UA: NEGATIVE
Leukocytes, UA: NEGATIVE
Nitrite, UA: NEGATIVE
Protein, UA: POSITIVE — AB
Spec Grav, UA: 1.02 (ref 1.010–1.025)
Urobilinogen, UA: 0.2 E.U./dL
pH, UA: 5 (ref 5.0–8.0)

## 2022-04-02 NOTE — Progress Notes (Addendum)
BP 128/72   Pulse 94   Temp 98.2 F (36.8 C) (Oral)   Resp 16   Ht '5\' 1"'$  (1.549 m)   Wt 170 lb 11.2 oz (77.4 kg)   LMP 05/09/1993 (Approximate)   SpO2 98%   BMI 32.25 kg/m    Subjective:    Patient ID: Kristina Reed, female    DOB: 09-12-1966, 56 y.o.   MRN: [id number]  HPI: Kristina Reed is a 56 y.o. female  Chief Complaint  Patient presents with   Abdominal Pain    Follow up    LLQ abdominal pain: Saw patient yesterday for LLQ abdominal pain that started yesterday morning.  She had mentioned that she had not had a bowel movement in 4 days.  Discussed that this could be due to constipation.  Ordered a KUB, urine dip and lab work.  Patient was unable to give urine sample yesterday.  Patient came back this morning to give urine sample which did have large blood.  Patient reports she still having the pain in the left lower quadrant.  Patient continues to state that she does not have urinary frequency or urgency.  She continues to deny fever.  Patient denies any nausea, vomiting, blood in stools or diarrhea.  Colonoscopy is up to date. Discussed this could be a kidney stone or could be diverticulitis.  Will order CT scan.  Relevant past medical, surgical, family and social history reviewed and updated as indicated. Interim medical history since our last visit reviewed. Allergies and medications reviewed and updated.  Review of Systems  Constitutional: Negative for fever or weight change.  Respiratory: Negative for cough and shortness of breath.   Cardiovascular: Negative for chest pain or palpitations.  Gastrointestinal: positive for abdominal pain, no bowel changes.  Musculoskeletal: Negative for gait problem or joint swelling.  Skin: Negative for rash.  Neurological: Negative for dizziness or headache.  No other specific complaints in a complete review of systems (except as listed in HPI above).      Objective:    BP 128/72   Pulse 94   Temp 98.2 F (36.8 C) (Oral)    Resp 16   Ht '5\' 1"'$  (1.549 m)   Wt 170 lb 11.2 oz (77.4 kg)   LMP 05/09/1993 (Approximate)   SpO2 98%   BMI 32.25 kg/m   Wt Readings from Last 3 Encounters:  04/02/22 170 lb 11.2 oz (77.4 kg)  04/01/22 171 lb 14.4 oz (78 kg)  03/03/22 170 lb 11.2 oz (77.4 kg)    Physical Exam  Constitutional: Patient appears well-developed and well-nourished. Obese  No distress.  HEENT: head atraumatic, normocephalic, pupils equal and reactive to light,  neck supple Cardiovascular: Normal rate, regular rhythm and normal heart sounds.  No murmur heard. No BLE edema. Pulmonary/Chest: Effort normal and breath sounds normal. No respiratory distress. Abdominal: Soft.  Tenderness in left lower quadrant.  No CVA tenderness Psychiatric: Patient has a normal mood and affect. behavior is normal. Judgment and thought content normal.  Results for orders placed or performed in visit on 04/02/22  POCT urinalysis dipstick  Result Value Ref Range   Color, UA gold    Clarity, UA cloudy    Glucose, UA Negative Negative   Bilirubin, UA negative    Ketones, UA negative    Spec Grav, UA 1.020 1.010 - 1.025   Blood, UA large    pH, UA 5.0 5.0 - 8.0   Protein, UA Positive (A) Negative  Urobilinogen, UA 0.2 0.2 or 1.0 E.U./dL   Nitrite, UA negative    Leukocytes, UA Negative Negative   Appearance cloudy    Odor strong       Assessment & Plan:   Problem List Items Addressed This Visit   None Visit Diagnoses     Left lower quadrant abdominal pain    -  Primary   Urine dip done today showed large blood.  KUB was negative yesterday CBC and CMP were normal.  We will get stat CT scan.   Relevant Orders   POCT urinalysis dipstick (Completed)   CT Abdomen Pelvis W Contrast        Follow up plan: Return if symptoms worsen or fail to improve.

## 2022-04-07 ENCOUNTER — Other Ambulatory Visit: Payer: Self-pay

## 2022-04-07 ENCOUNTER — Ambulatory Visit
Admission: RE | Admit: 2022-04-07 | Discharge: 2022-04-07 | Disposition: A | Discharge status: Home / Self Care | Payer: Medicaid Other | Source: Ambulatory Visit | Attending: Nurse Practitioner | Admitting: Nurse Practitioner

## 2022-04-07 ENCOUNTER — Other Ambulatory Visit: Payer: Self-pay | Admitting: Nurse Practitioner

## 2022-04-07 DIAGNOSIS — R1032 Left lower quadrant pain: Secondary | ICD-10-CM | POA: Insufficient documentation

## 2022-04-07 DIAGNOSIS — Z7689 Persons encountering health services in other specified circumstances: Secondary | ICD-10-CM | POA: Diagnosis not present

## 2022-04-07 DIAGNOSIS — K5792 Diverticulitis of intestine, part unspecified, without perforation or abscess without bleeding: Secondary | ICD-10-CM

## 2022-04-07 MED ORDER — LEVOFLOXACIN 750 MG PO TABS: 750.0000 mg | ORAL_TABLET | Freq: Every day | ORAL | 0 refills | Status: DC

## 2022-04-07 MED ORDER — IOHEXOL 300 MG/ML  SOLN
100.0000 mL | Freq: Once | INTRAMUSCULAR | Status: AC | PRN
  Administered 2022-04-07: 100 mL via INTRAVENOUS

## 2022-04-07 MED ORDER — METRONIDAZOLE 500 MG PO TABS: 500.0000 mg | ORAL_TABLET | Freq: Three times a day (TID) | ORAL | 0 refills | Status: AC

## 2022-04-08 ENCOUNTER — Ambulatory Visit (INDEPENDENT_AMBULATORY_CARE_PROVIDER_SITE_OTHER): Payer: Medicaid Other | Admitting: Surgery

## 2022-04-08 ENCOUNTER — Other Ambulatory Visit: Payer: Self-pay

## 2022-04-08 ENCOUNTER — Ambulatory Visit: Payer: Medicaid Other | Admitting: Gastroenterology

## 2022-04-08 ENCOUNTER — Encounter: Payer: Self-pay | Admitting: Surgery

## 2022-04-08 ENCOUNTER — Other Ambulatory Visit
Admission: RE | Admit: 2022-04-08 | Discharge: 2022-04-08 | Disposition: A | Discharge status: Home / Self Care | Payer: Medicaid Other | Source: Ambulatory Visit | Attending: Surgery | Admitting: Surgery

## 2022-04-08 ENCOUNTER — Ambulatory Visit: Payer: Medicaid Other | Admitting: Family Medicine

## 2022-04-08 VITALS — BP 150/81 | HR 91 | Temp 98.1°F | Ht 61.0 in | Wt 167.8 lb

## 2022-04-08 DIAGNOSIS — K5720 Diverticulitis of large intestine with perforation and abscess without bleeding: Secondary | ICD-10-CM

## 2022-04-08 DIAGNOSIS — K640 First degree hemorrhoids: Secondary | ICD-10-CM

## 2022-04-08 DIAGNOSIS — K5732 Diverticulitis of large intestine without perforation or abscess without bleeding: Secondary | ICD-10-CM

## 2022-04-08 LAB — COMPREHENSIVE METABOLIC PANEL
ALT: 46 U/L — ABNORMAL HIGH (ref 0–44)
AST: 55 U/L — ABNORMAL HIGH (ref 15–41)
Albumin: 4 g/dL (ref 3.5–5.0)
Alkaline Phosphatase: 87 U/L (ref 38–126)
Anion gap: 10 (ref 5–15)
BUN: 10 mg/dL (ref 6–20)
CO2: 25 mmol/L (ref 22–32)
Calcium: 9.1 mg/dL (ref 8.9–10.3)
Chloride: 104 mmol/L (ref 98–111)
Creatinine, Ser: 0.94 mg/dL (ref 0.44–1.00)
GFR, Estimated: >60 mL/min (ref >60)
Glucose, Bld: 107 mg/dL — ABNORMAL HIGH (ref 70–99)
Potassium: 3.8 mmol/L (ref 3.5–5.1)
Sodium: 139 mmol/L (ref 135–145)
Total Bilirubin: 0.6 mg/dL (ref 0.3–1.2)
Total Protein: 7.8 g/dL (ref 6.5–8.1)

## 2022-04-08 LAB — CBC
HCT: 38.7 % (ref 36.0–46.0)
Hemoglobin: 12.6 g/dL (ref 12.0–15.0)
MCH: 27.2 pg (ref 26.0–34.0)
MCHC: 32.6 g/dL (ref 30.0–36.0)
MCV: 83.6 fL (ref 80.0–100.0)
Platelets: 302 10*3/uL (ref 150–400)
RBC: 4.63 MIL/uL (ref 3.87–5.11)
RDW: 13.6 % (ref 11.5–15.5)
WBC: 7.6 10*3/uL (ref 4.0–10.5)
nRBC: 0 % (ref 0.0–0.2)

## 2022-04-08 NOTE — Progress Notes (Signed)
Per Dr. Serafina Royals Good morning, you are seeing Kristina Reed today and I wanted to give you the heads up that she was c/o abdominal pain, we got a ct which resulted yesterday and it showed Changes of active diverticulitis in the proximal sigmoid colon with micro perforation.   Per Dr. Devota Pace, please place urgent referral to surgery and they need to see her today  DX Perforated diverticulitis  Placed urgent referral to general surgery

## 2022-04-08 NOTE — Progress Notes (Signed)
cbc

## 2022-04-08 NOTE — Patient Instructions (Addendum)
Clear liquid diet for the next 2 days. Then a soft food diet for 2 days. If no improvement by Friday please call our office and let us know.  This will allow the perforation to heal along with the Antibiotics. Be sure to finish all Antibiotics until gone.    Diverticulitis  Diverticulitis is infection or inflammation of small pouches (diverticula) in the colon that form due to a condition called diverticulosis. Diverticula can trap stool (feces) and bacteria, causing infection and inflammation. Diverticulitis may cause severe stomach pain and diarrhea. It may lead to tissue damage in the colon that causes bleeding or blockage. The diverticula may also burst (rupture) and cause infected stool to enter other areas of the abdomen. What are the causes? This condition is caused by stool becoming trapped in the diverticula, which allows bacteria to grow in the diverticula. This leads to inflammation and infection. What increases the risk? You are more likely to develop this condition if you have diverticulosis. The risk increases if you: Are overweight or obese. Do not get enough exercise. Drink alcohol. Use tobacco products. Eat a diet that has a lot of red meat such as beef, pork, or lamb. Eat a diet that does not include enough fiber. High-fiber foods include fruits, vegetables, beans, nuts, and whole grains. Are over 64 years of age. What are the signs or symptoms? Symptoms of this condition may include: Pain and tenderness in the abdomen. The pain is normally located on the left side of the abdomen, but it may occur in other areas. Fever and chills. Nausea. Vomiting. Cramping. Bloating. Changes in bowel routines. Blood in your stool. How is this diagnosed? This condition is diagnosed based on: Your medical history. A physical exam. Tests to make sure there is nothing else causing your condition. These tests may include: Blood tests. Urine tests. CT scan of the abdomen. How is this  treated? Most cases of this condition are mild and can be treated at home. Treatment may include: Taking over-the-counter pain medicines. Following a clear liquid diet. Taking antibiotic medicines by mouth. Resting. More severe cases may need to be treated at a hospital. Treatment may include: Not eating or drinking. Taking prescription pain medicine. Receiving antibiotic medicines through an IV. Receiving fluids and nutrition through an IV. Surgery. When your condition is under control, your health care provider may recommend that you have a colonoscopy. This is an exam to look at the entire large intestine. During the exam, a lubricated, bendable tube is inserted into the anus and then passed into the rectum, colon, and other parts of the large intestine. A colonoscopy can show how severe your diverticula are and whether something else may be causing your symptoms. Follow these instructions at home: Medicines Take over-the-counter and prescription medicines only as told by your health care provider. These include fiber supplements, probiotics, and stool softeners. If you were prescribed an antibiotic medicine, take it as told by your health care provider. Do not stop taking the antibiotic even if you start to feel better. Ask your health care provider if the medicine prescribed to you requires you to avoid driving or using machinery. Eating and drinking  Follow a full liquid diet or another diet as directed by your health care provider. After your symptoms improve, your health care provider may tell you to change your diet. He or she may recommend that you eat a diet that contains at least 25 grams (25 g) of fiber daily. Fiber makes it easier to  pass stool. Healthy sources of fiber include: Berries. One cup contains 4-8 grams of fiber. Beans or lentils. One-half cup contains 5-8 grams of fiber. Green vegetables. One cup contains 4 grams of fiber. Avoid eating red meat. General  instructions Do not use any products that contain nicotine or tobacco, such as cigarettes, e-cigarettes, and chewing tobacco. If you need help quitting, ask your health care provider. Exercise for at least 30 minutes, 3 times each week. You should exercise hard enough to raise your heart rate and break a sweat. Keep all follow-up visits as told by your health care provider. This is important. You may need to have a colonoscopy. Contact a health care provider if: Your pain does not improve. Your bowel movements do not return to normal. Get help right away if: Your pain gets worse. Your symptoms do not get better with treatment. Your symptoms suddenly get worse. You have a fever. You vomit more than one time. You have stools that are bloody, black, or tarry. Summary Diverticulitis is infection or inflammation of small pouches (diverticula) in the colon that form due to a condition called diverticulosis. Diverticula can trap stool (feces) and bacteria, causing infection and inflammation. You are at higher risk for this condition if you have diverticulosis and you eat a diet that does not include enough fiber. Most cases of this condition are mild and can be treated at home. More severe cases may need to be treated at a hospital. When your condition is under control, your health care provider may recommend that you have an exam called a colonoscopy. This exam can show how severe your diverticula are and whether something else may be causing your symptoms. Keep all follow-up visits as told by your health care provider. This is important. This information is not intended to replace advice given to you by your health care provider. Make sure you discuss any questions you have with your health care provider. Document Revised: 06/12/2019 Document Reviewed: 06/12/2019 Elsevier Patient Education  [place].

## 2022-04-08 NOTE — Progress Notes (Deleted)
Name: Kristina Reed   MRN: [id number]    DOB: June 06, 1966   Date:04/08/2022       Progress Note  Subjective  Chief Complaint  Left Side Pain  HPI  *** Patient Active Problem List   Diagnosis Date Noted   S/P TKR (total knee replacement) 11/25/2021   S/P TKR (total knee replacement) using cement, right 11/24/2021   Erosive esophagitis    Gastric erosion determined by endoscopy    Duodenal erythema    History of colonic polyps    Erythema of rectum    Chronic pain syndrome 11/26/2020   Bilateral primary osteoarthritis of knee 11/26/2020   Grade I internal hemorrhoids 11/16/2019   Genetic testing 04/20/2019   Family history of breast cancer    History of cervical cancer    Mastalgia 03/17/2019   Family hx-breast malignancy 12/16/2018   Dysphagia    Essential hypertension 01/19/2018   Obesity (BMI 30.0-34.9) 78/93/8101   Eosinophilic esophagitis 75/06/2584   Apophysitis 08/26/2017   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   Pain in joint, multiple sites 07/05/2017   Osteoarthritis of knee 27/78/2423   Lichen sclerosus et atrophicus 04/27/2017   Headache 04/27/2017   Headache disorder 02/17/2017   Post-concussion headache 02/17/2017   Medication monitoring encounter 09/21/2016   Hematuria 04/23/2016   Hx of cervical cancer 01/30/2016   Menopause 01/30/2016   Status post bilateral breast biopsy 01/07/2016   Rectal bleeding 04/30/2015   Chronic pain of both knees 07/23/2014   Depression, major, recurrent, moderate ([place]) 02/02/2014   Adult hypothyroidism 02/02/2014   Obstructive apnea 02/02/2014   Anxiety and depression 02/02/2014   Incomplete bladder emptying 12/12/2012   Tenosynovitis of foot 05/30/2012   Benign neoplasm of kidney 05/13/2012   Urge incontinence 05/13/2012   FOM (frequency of micturition) 05/13/2012   Chronic pain of right hand 05/05/2012   Tarsal tunnel syndrome 03/03/2012    Past Surgical History:  Procedure Laterality Date   ABDOMINAL HYSTERECTOMY      partial hysterectomy   BREAST BIOPSY Bilateral 01/01/2016   Fibroadenoma   COLONOSCOPY WITH PROPOFOL N/A 06/27/2015   Procedure: COLONOSCOPY WITH PROPOFOL;  Surgeon: Josefine Class, MD;  Location: [facility] ENDOSCOPY;  Service: Endoscopy;  Laterality: N/A;   COLONOSCOPY WITH PROPOFOL N/A 09/23/2017   Procedure: COLONOSCOPY WITH PROPOFOL;  Surgeon: Jonathon Bellows, MD;  Location: [facility] ENDOSCOPY;  Service: Gastroenterology;  Laterality: N/A;   COLONOSCOPY WITH PROPOFOL N/A 06/09/2021   Procedure: COLONOSCOPY WITH PROPOFOL;  Surgeon: Lin Landsman, MD;  Location: [facility] ENDOSCOPY;  Service: Gastroenterology;  Laterality: N/A;   ESOPHAGOGASTRODUODENOSCOPY (EGD) WITH PROPOFOL N/A 07/22/2017   Procedure: ESOPHAGOGASTRODUODENOSCOPY (EGD) WITH PROPOFOL;  Surgeon: Jonathon Bellows, MD;  Location: [facility] ENDOSCOPY;  Service: Gastroenterology;  Laterality: N/A;   ESOPHAGOGASTRODUODENOSCOPY (EGD) WITH PROPOFOL N/A 03/31/2018   Procedure: ESOPHAGOGASTRODUODENOSCOPY (EGD) WITH PROPOFOL;  Surgeon: Lin Landsman, MD;  Location: [facility] ENDOSCOPY;  Service: Gastroenterology;  Laterality: N/A;   ESOPHAGOGASTRODUODENOSCOPY (EGD) WITH PROPOFOL N/A 08/25/2021   Procedure: ESOPHAGOGASTRODUODENOSCOPY (EGD) WITH PROPOFOL;  Surgeon: Lin Landsman, MD;  Location: [facility] ENDOSCOPY;  Service: Gastroenterology;  Laterality: N/A;   KNEE ARTHROSCOPY Right 07/22/2016   Procedure: ARTHROSCOPY KNEE debridement microfracture;  Surgeon: Leanor Kail, MD;  Location: [facility] ORS;  Service: Orthopedics;  Laterality: Right;   KNEE ARTHROSCOPY WITH MEDIAL MENISECTOMY Left 11/10/2017   Procedure: KNEE ARTHROSCOPY WITH PARTIAL MEDIAL MENISECTOMY&patella femoral debridement, & ablation;  Surgeon: Leanor Kail, MD;  Location: [facility] ORS;  Service: Orthopedics;  Laterality: Left;   TOTAL KNEE ARTHROPLASTY  Right 11/24/2021   Procedure: TOTAL KNEE ARTHROPLASTY;  Surgeon: Lovell Sheehan, MD;  Location: [facility] ORS;  Service: Orthopedics;   Laterality: Right;   TUBAL LIGATION      Family History  Problem Relation Age of Onset   Diabetes Mother    Lung cancer Father 42   Breast cancer Maternal Grandmother 79   Cancer Paternal Grandmother        cancer?   Breast cancer Maternal Aunt        80s   Cirrhosis Cousin    Breast cancer Cousin    Breast cancer Other        x4    Social History   Tobacco Use   Smoking status: Never   Smokeless tobacco: Never  Substance Use Topics   Alcohol use: Not Currently     Current Outpatient Medications:    aspirin 81 MG chewable tablet, Chew 1 tablet (81 mg total) by mouth 2 (two) times daily., Disp: 60 tablet, Rfl: 0   cloNIDine (CATAPRES - DOSED IN MG/24 HR) 0.2 mg/24hr patch, Place 1 patch (0.2 mg total) onto the skin once a week., Disp: , Rfl:    cloNIDine (CATAPRES) 0.1 MG tablet, Take 1 tablet by mouth daily., Disp: , Rfl:    diclofenac (VOLTAREN) 75 MG EC tablet, Take 1 tablet by mouth 2 (two) times daily., Disp: , Rfl:    dicyclomine (BENTYL) 20 MG tablet, Take 1 tablet (20 mg total) by mouth 3 (three) times daily as needed for spasms (abd cramping)., Disp: 20 tablet, Rfl: 0   docusate sodium (COLACE) 100 MG capsule, Take 1 capsule (100 mg total) by mouth 2 (two) times daily., Disp: 30 capsule, Rfl: 0   estradiol (ESTRACE) 1 MG tablet, Take 1 tablet by mouth daily., Disp: , Rfl:    FLUoxetine (PROZAC) 20 MG capsule, Take 1 capsule by mouth once daily, Disp: 90 capsule, Rfl: 0   hydrocortisone (ANUSOL-HC) 25 MG suppository, INSERT 1 SUPPOSITORY RECTALLY TWICE A DAY, Disp: , Rfl:    levofloxacin (LEVAQUIN) 750 MG tablet, Take 1 tablet (750 mg total) by mouth daily., Disp: 7 tablet, Rfl: 0   levothyroxine (SYNTHROID) 75 MCG tablet, Take 1 tablet (75 mcg total) by mouth daily before breakfast., Disp: 30 tablet, Rfl: 0   lidocaine (LIDODERM) 5 %, APPLY 1 PATCH BY TOPICAL ROUTE ONCE DAILY (MAY WEAR UP TO 12HOURS.), Disp: , Rfl:    linaclotide (LINZESS) 290 MCG CAPS capsule, TAKE  ONE CAPSULE BY MOUTH EVERY MORNING BEFORE BREAKFAST, Disp: , Rfl:    meloxicam (MOBIC) 15 MG tablet, Take 15 mg by mouth daily., Disp: , Rfl:    methocarbamol (ROBAXIN) 500 MG tablet, TAKE 1 TABLET BY MOUTH EVERY 6 HOURS AS NEEDED FOR MUSCLE SPASM, Disp: , Rfl:    metroNIDAZOLE (FLAGYL) 500 MG tablet, Take 1 tablet (500 mg total) by mouth 3 (three) times daily for 7 days., Disp: 21 tablet, Rfl: 0   potassium chloride SA (KLOR-CON M) 20 MEQ tablet, , Disp: , Rfl:    traMADol (ULTRAM) 50 MG tablet, Take 50 mg by mouth every 6 (six) hours., Disp: , Rfl:   Allergies  Allergen Reactions   Prednisone Nausea And Vomiting   Methylprednisolone Palpitations    Increased heart rate   Percocet [Oxycodone-Acetaminophen] Palpitations    I personally reviewed active problem list, medication list, allergies, family history, social history, health maintenance with the patient/caregiver today.   ROS  ***  Objective  There were no vitals filed  for this visit.  There is no height or weight on file to calculate BMI.  Physical Exam ***  PHQ2/9:    04/01/2022   10:16 AM 03/03/2022   11:32 AM 11/14/2021    8:06 AM 10/31/2021    1:24 PM 10/15/2021   10:53 AM  Depression screen PHQ 2/9  Decreased Interest 0 0 0 0 0  Down, Depressed, Hopeless 3 0 0 0 0  PHQ - 2 Score 3 0 0 0 0  Altered sleeping 3  0 0 0  Tired, decreased energy 1  0 0 0  Change in appetite 1  0 0 0  Feeling bad or failure about yourself  0  0 0 0  Trouble concentrating 0  0 0 0  Moving slowly or fidgety/restless 0  0 0 0  Suicidal thoughts 0  0 0 0  PHQ-9 Score 8  0 0 0  Difficult doing work/chores Not difficult at all  Not difficult at all Not difficult at all Not difficult at all    phq 9 is {gen pos [id number]}   Fall Risk:    04/02/2022    7:57 AM 04/01/2022   10:10 AM 03/03/2022   11:31 AM 11/14/2021    8:06 AM 10/31/2021    1:24 PM  Fall Risk   Falls in the past year? 0 0 1 0 0  Number falls in past yr: 0 0 0 0 0   Injury with Fall? 0 0 0 0 0  Risk for fall due to :   No Fall Risks No Fall Risks   Follow up Falls evaluation completed Falls evaluation completed Falls prevention discussed Falls prevention discussed       Functional Status Survey:      Assessment & Plan  *** There are no diagnoses linked to this encounter.

## 2022-04-09 ENCOUNTER — Encounter: Payer: Self-pay | Admitting: Surgery

## 2022-04-09 NOTE — Progress Notes (Signed)
04/08/2022  Reason for Visit:  Perforated diverticulitis  Requesting Provider:  Sherri Sear, MD  History of Present Illness: Kristina Reed is a 56 y.o. female presenting urgently for evaluation of perforated diverticulitis.  The patient reports having pain in the left lower quadrant for about two weeks.  The pain has been sharp and severe, but she reports overall the pain has been stable without worsening since it started.  She has been able to keep food down and is having bowel function.  Denies any blood in the stool, or worsening pain with bowel movements.  Denies any fevers or chills, nausea, or vomiting.  She saw Ms. Pender on 04/01/22, and her labwork was normal with WBC of 9.4 and normal CMP.  Abdominal xray did not show any abnormalities.  CT scan was then obtained on 04/07/22 and this showed diverticulitis of the sigmoid colon with evidence of microperforation.  No abscess or free air noted.  I personally viewed the images and agree with the findings.  She was started on Levaquin and Flagyl yesterday and referred to Dr. Marius Ditch, and Dr. Marius Ditch referred her urgently to [name].    Today, the patient a repeat set of labs given the new findings on her CT scan.  Her WBC is still normal at 7.6.  She reports stable pain in the left lower quadrant without any spread or radiation and denies any worsening issues.  Past Medical History: Past Medical History:  Diagnosis Date   Anxiety    Arthritis    right knee and right elbow   Cancer ([place])    Cervical CA with partial hysterectomy.   Cognitive impairment, mild, so stated    Depression    Diverticulosis    Eosinophilic esophagitis 32/20/2542   Biopsy Dec 2018   Esophageal dysphagia    Family history of breast cancer 05/2021   cancer genetic testing letter sent   Gallstones    GERD (gastroesophageal reflux disease)    History of cervical cancer    Hx of cervical cancer 01/30/2016   Hypertension    Hypothyroidism    Sleep apnea    does not use a  C-PAP   Status post partial hysterectomy    Due to Cervical CA   Vaginal inclusion cyst      Past Surgical History: Past Surgical History:  Procedure Laterality Date   ABDOMINAL HYSTERECTOMY     partial hysterectomy   BREAST BIOPSY Bilateral 01/01/2016   Fibroadenoma   COLONOSCOPY WITH PROPOFOL N/A 06/27/2015   Procedure: COLONOSCOPY WITH PROPOFOL;  Surgeon: Josefine Class, MD;  Location: [facility] ENDOSCOPY;  Service: Endoscopy;  Laterality: N/A;   COLONOSCOPY WITH PROPOFOL N/A 09/23/2017   Procedure: COLONOSCOPY WITH PROPOFOL;  Surgeon: Jonathon Bellows, MD;  Location: [facility] ENDOSCOPY;  Service: Gastroenterology;  Laterality: N/A;   COLONOSCOPY WITH PROPOFOL N/A 06/09/2021   Procedure: COLONOSCOPY WITH PROPOFOL;  Surgeon: Lin Landsman, MD;  Location: [facility] ENDOSCOPY;  Service: Gastroenterology;  Laterality: N/A;   ESOPHAGOGASTRODUODENOSCOPY (EGD) WITH PROPOFOL N/A 07/22/2017   Procedure: ESOPHAGOGASTRODUODENOSCOPY (EGD) WITH PROPOFOL;  Surgeon: Jonathon Bellows, MD;  Location: [facility] ENDOSCOPY;  Service: Gastroenterology;  Laterality: N/A;   ESOPHAGOGASTRODUODENOSCOPY (EGD) WITH PROPOFOL N/A 03/31/2018   Procedure: ESOPHAGOGASTRODUODENOSCOPY (EGD) WITH PROPOFOL;  Surgeon: Lin Landsman, MD;  Location: [facility] ENDOSCOPY;  Service: Gastroenterology;  Laterality: N/A;   ESOPHAGOGASTRODUODENOSCOPY (EGD) WITH PROPOFOL N/A 08/25/2021   Procedure: ESOPHAGOGASTRODUODENOSCOPY (EGD) WITH PROPOFOL;  Surgeon: Lin Landsman, MD;  Location: [facility] ENDOSCOPY;  Service: Gastroenterology;  Laterality:  N/A;   KNEE ARTHROSCOPY Right 07/22/2016   Procedure: ARTHROSCOPY KNEE debridement microfracture;  Surgeon: Leanor Kail, MD;  Location: [facility] ORS;  Service: Orthopedics;  Laterality: Right;   KNEE ARTHROSCOPY WITH MEDIAL MENISECTOMY Left 11/10/2017   Procedure: KNEE ARTHROSCOPY WITH PARTIAL MEDIAL MENISECTOMY&patella femoral debridement, & ablation;  Surgeon: Leanor Kail, MD;  Location: [facility] ORS;   Service: Orthopedics;  Laterality: Left;   TOTAL KNEE ARTHROPLASTY Right 11/24/2021   Procedure: TOTAL KNEE ARTHROPLASTY;  Surgeon: Lovell Sheehan, MD;  Location: [facility] ORS;  Service: Orthopedics;  Laterality: Right;   TUBAL LIGATION      Home Medications: Prior to Admission medications   Medication Sig Start Date End Date Taking? Authorizing Provider  aspirin 81 MG chewable tablet Chew 1 tablet (81 mg total) by mouth 2 (two) times daily. 11/25/21  Yes Carlynn Spry, PA-C  cloNIDine (CATAPRES - DOSED IN MG/24 HR) 0.2 mg/24hr patch Place 1 patch (0.2 mg total) onto the skin once a week.   Yes [provider]  cloNIDine (CATAPRES) 0.1 MG tablet Take 1 tablet by mouth daily.   Yes [provider]  diclofenac (VOLTAREN) 75 MG EC tablet Take 1 tablet by mouth 2 (two) times daily.   Yes [provider]  dicyclomine (BENTYL) 20 MG tablet Take 1 tablet (20 mg total) by mouth 3 (three) times daily as needed for spasms (abd cramping). 04/01/22  Yes Bo Merino, FNP  levofloxacin (LEVAQUIN) 750 MG tablet Take 1 tablet (750 mg total) by mouth daily. 04/07/22  Yes Bo Merino, FNP  levothyroxine (SYNTHROID) 75 MCG tablet Take 1 tablet (75 mcg total) by mouth daily before breakfast. 03/13/22  Yes Teodora Medici, DO  lidocaine (LIDODERM) 5 % APPLY 1 PATCH BY TOPICAL ROUTE ONCE DAILY (MAY WEAR UP TO 12HOURS.) 04/22/21  Yes [provider]  linaclotide (LINZESS) 290 MCG CAPS capsule TAKE ONE CAPSULE BY MOUTH EVERY MORNING BEFORE BREAKFAST   Yes [provider]  meloxicam (MOBIC) 15 MG tablet Take 15 mg by mouth daily. 02/04/22  Yes [provider]  methocarbamol (ROBAXIN) 500 MG tablet TAKE 1 TABLET BY MOUTH EVERY 6 HOURS AS NEEDED FOR MUSCLE SPASM   Yes [provider]  metroNIDAZOLE (FLAGYL) 500 MG tablet Take 1 tablet (500 mg total) by mouth 3 (three) times daily for 7 days. 04/07/22 04/14/22 Yes Bo Merino, FNP    Allergies: Allergies   Allergen Reactions   Prednisone Nausea And Vomiting   Methylprednisolone Palpitations    Increased heart rate   Percocet [Oxycodone-Acetaminophen] Palpitations    Social History:  reports that she has never smoked. She has never used smokeless tobacco. She reports that she does not currently use alcohol. She reports that she does not currently use drugs.   Family History: Family History  Problem Relation Age of Onset   Diabetes Mother    Lung cancer Father 21   Breast cancer Maternal Grandmother 97   Cancer Paternal Grandmother        cancer?   Breast cancer Maternal Aunt        80s   Cirrhosis Cousin    Breast cancer Cousin    Breast cancer Other        x4    Review of Systems: Review of Systems  Constitutional:  Negative for chills and fever.  HENT:  Negative for hearing loss.   Respiratory:  Negative for shortness of breath.   Cardiovascular:  Negative for chest pain.  Gastrointestinal:  Positive  for abdominal pain. Negative for constipation, diarrhea, nausea and vomiting.  Genitourinary:  Negative for dysuria.  Musculoskeletal:  Negative for myalgias.  Skin:  Negative for rash.  Neurological:  Negative for dizziness.  Psychiatric/Behavioral:  Negative for depression.     Physical Exam BP (!) 150/81   Pulse 91   Temp 98.1 F (36.7 C) (Oral)   Ht '5\' 1"'$  (1.549 m)   Wt 167 lb 12.8 oz (76.1 kg)   LMP 05/09/1993 (Approximate)   SpO2 96%   BMI 31.71 kg/m  CONSTITUTIONAL: No acute distress, well nourished. HEENT:  Normocephalic, atraumatic, extraocular motion intact. NECK: Trachea is midline, and there is no jugular venous distension.  RESPIRATORY:  Lungs are clear, and breath sounds are equal bilaterally. Normal respiratory effort without pathologic use of accessory muscles. CARDIOVASCULAR: Heart is regular without murmurs, gallops, or rubs. GI: The abdomen is soft, non-distended, with focal tenderness in the left lower quadrant.  Patient states it is 10 out of  10, but she does not appear to be in that much pain.  MUSCULOSKELETAL:  Normal muscle strength and tone in all four extremities.  No peripheral edema or cyanosis. SKIN: Skin turgor is normal. There are no pathologic skin lesions.  NEUROLOGIC:  Motor and sensation is grossly normal.  Cranial nerves are grossly intact. PSYCH:  Alert and oriented to person, place and time. Affect is normal.  Laboratory Analysis: Labs from 04/08/22: Sodium 139, potassium 3.8, chloride 104, CO2 25, BUN 10, creatinine 0.94.  Total bilirubin 0.6, AST 55, ALT 46, alkaline phosphatase 87, albumin 4.0.  WBC 7.6, hemoglobin 12.6, hematocrit 38.7, platelets 302.  Imaging: CT scan abdomen/pelvis on 04/07/2022: IMPRESSION: Changes of active diverticulitis in the proximal sigmoid colon with micro perforation. No frank free air noted elsewhere in the abdomen or pelvis.   Fatty liver.  Assessment and Plan: This is a 56 y.o. female with acute diverticulitis with microperforation of the sigmoid colon.  - Discussed with the patient the findings on her laboratory studies and CT scan images.  These show that she has acute diverticulitis of the sigmoid colon with evidence of one area of microperforation.  However there is no free air or abscess formation.  Her white blood cell count remains normal.  The patient reports a pain that is 10 out of 10, she does not appear to be in that much pain distress.  I think given these findings, and the clinical exam today, I think it is appropriate to continue treating this as an outpatient with antibiotics already prescribed.  Discussed with patient that she should keep a clear liquid diet for the next 2 days and then advance to a soft mushy diet for 2 more days and then advance her diet as tolerated.  She should continue taking antibiotics as prescribed and complete the course as instructed. - Advised the patient to call us back by 7/29 if there is no improvement in her pain or if there is any  worsening so that we can see her again.  Discussed with her that we may need to admit her to hospital for IV antibiotics if that were the case.  Otherwise, we will follow-up with the patient in 2 weeks to reassess her progress.  I spent 60 minutes dedicated to the care of this patient on the date of this encounter to include pre-visit review of records, face-to-face time with the patient discussing diagnosis and management, and any post-visit coordination of care.   Melvyn Neth, MD [facility]

## 2022-04-14 DIAGNOSIS — Z419 Encounter for procedure for purposes other than remedying health state, unspecified: Secondary | ICD-10-CM | POA: Diagnosis not present

## 2022-04-16 DIAGNOSIS — Z7689 Persons encountering health services in other specified circumstances: Secondary | ICD-10-CM | POA: Diagnosis not present

## 2022-04-17 ENCOUNTER — Ambulatory Visit (INDEPENDENT_AMBULATORY_CARE_PROVIDER_SITE_OTHER): Payer: Medicaid Other | Admitting: Internal Medicine

## 2022-04-17 ENCOUNTER — Encounter: Payer: Self-pay | Admitting: Internal Medicine

## 2022-04-17 VITALS — BP 122/88 | HR 92 | Temp 98.0°F | Resp 16 | Ht 61.0 in | Wt 170.4 lb

## 2022-04-17 DIAGNOSIS — Z7689 Persons encountering health services in other specified circumstances: Secondary | ICD-10-CM | POA: Diagnosis not present

## 2022-04-17 DIAGNOSIS — K5792 Diverticulitis of intestine, part unspecified, without perforation or abscess without bleeding: Secondary | ICD-10-CM

## 2022-04-17 DIAGNOSIS — I10 Essential (primary) hypertension: Secondary | ICD-10-CM

## 2022-04-17 NOTE — Progress Notes (Signed)
Established Patient Office Visit  Subjective   Patient ID: Kristina Reed, female    DOB: 08/16/1966  Age: 56 y.o. MRN: [id number]  Chief Complaint  Patient presents with   Follow-up   Abdominal Pain   Hypertension    HPI Kristina Reed is a 56 year old female here for follow up on recent diverticulitis and blood pressure.   She was initially seen in this office on 04/02/22 complaining of LLQ pain x 1 day and constipation. Urine in the office was negative for infection but showing blood. CBC, CMP normal. CT abdomen/pelvis on 04/07/22 showing changes in active diverticulitis in the proximal sigmoid colon with micro perforation without frank air noted. She was given a course of Levaquin 750 mg and Flagyl 500 TID x 7 days, which she states today she did not finish. She started the medication last week but forgot and missed several doses and eventually stopped taking it. She denies side effects from the medication. She was evaluated by surgery on 04/08/22, note reviewed. Plan to keep patient outpatient with bowel rest. Today she states that her pain is getting worse. It is located in LLQ, pain will sometimes double her over. Does not radiate. Appetite good, eating salads. Denies fevers. No nausea, vomiting, diarrhea, constipation, blood in stool or urinary issues.   Hypertension: -Medications: Clonidine 0.1 mg daily  -Patient is compliant with above medications and reports no side effects. -Checking BP at home (average): 130-145/70-90 -Denies any SOB, CP, vision changes, LE edema or symptoms of hypotension    Review of Systems  Constitutional:  Negative for chills and fever.  Gastrointestinal:  Positive for abdominal pain. Negative for blood in stool, constipation, diarrhea, heartburn, nausea and vomiting.  Genitourinary:  Negative for dysuria and hematuria.      Objective:     BP 122/88   Pulse 92   Temp 98 F (36.7 C)   Resp 16   Ht '5\' 1"'$  (1.549 m)   Wt 170 lb 6.4 oz (77.3 kg)   LMP  05/09/1993 (Approximate)   SpO2 98%   BMI 32.20 kg/m  BP Readings from Last 3 Encounters:  04/08/22 (!) 150/81  04/02/22 128/72  04/01/22 112/76   Wt Readings from Last 3 Encounters:  04/08/22 167 lb 12.8 oz (76.1 kg)  04/02/22 170 lb 11.2 oz (77.4 kg)  04/01/22 171 lb 14.4 oz (78 kg)      Physical Exam Constitutional:      Appearance: Normal appearance.  HENT:     Head: Normocephalic and atraumatic.  Eyes:     Conjunctiva/sclera: Conjunctivae normal.  Cardiovascular:     Rate and Rhythm: Normal rate and regular rhythm.  Pulmonary:     Effort: Pulmonary effort is normal.     Breath sounds: Normal breath sounds.  Abdominal:     General: Bowel sounds are normal. There is no distension.     Palpations: Abdomen is soft. There is no mass.     Tenderness: There is abdominal tenderness. There is no right CVA tenderness, left CVA tenderness, guarding or rebound.     Hernia: No hernia is present.     Comments: Tenderness to palpation in the LLQ  Musculoskeletal:     Right lower leg: No edema.     Left lower leg: No edema.  Skin:    General: Skin is warm and dry.  Neurological:     General: No focal deficit present.     Mental Status: She is alert. Mental status  is at baseline.  Psychiatric:        Mood and Affect: Mood normal.        Behavior: Behavior normal.     No results found for any visits on 04/17/22.  Last CBC Lab Results  Component Value Date   WBC 7.6 04/08/2022   HGB 12.6 04/08/2022   HCT 38.7 04/08/2022   MCV 83.6 04/08/2022   MCH 27.2 04/08/2022   RDW 13.6 04/08/2022   PLT 302 88/28/0034   Last metabolic panel Lab Results  Component Value Date   GLUCOSE 107 (H) 04/08/2022   NA 139 04/08/2022   K 3.8 04/08/2022   CL 104 04/08/2022   CO2 25 04/08/2022   BUN 10 04/08/2022   CREATININE 0.94 04/08/2022   GFRNONAA >60 04/08/2022   CALCIUM 9.1 04/08/2022   PROT 7.8 04/08/2022   ALBUMIN 4.0 04/08/2022   LABGLOB 2.9 01/13/2016   AGRATIO 1.6  01/13/2016   BILITOT 0.6 04/08/2022   ALKPHOS 87 04/08/2022   AST 55 (H) 04/08/2022   ALT 46 (H) 04/08/2022   ANIONGAP 10 04/08/2022   Last lipids Lab Results  Component Value Date   CHOL 220 (H) 03/26/2021   HDL 65 03/26/2021   LDLCALC 120 (H) 03/26/2021   TRIG 231 (H) 03/26/2021   CHOLHDL 3.4 03/26/2021   Last hemoglobin A1c Lab Results  Component Value Date   HGBA1C 5.8 (H) 03/03/2022   Last thyroid functions Lab Results  Component Value Date   TSH 6.82 (H) 03/11/2022   Last vitamin D No results found for: "25OHVITD2", "25OHVITD3", "VD25OH" Last vitamin B12 and Folate No results found for: "VITAMINB12", "FOLATE"    The 10-year ASCVD risk score (Arnett DK, et al., 2019) is: 3.1%    Assessment & Plan:   1. Diverticulitis: Patient did not finish antibiotic course and is eating more ruffage in her diet but still endorsing 10/10 LLQ pain. Recommend she finish the antibiotics but more importantly follow a soft bland diet. Eventually when her pain has improved she can increase dietary fiber. Abdominal exam benign, she is seeing general surgery next week.   2. Essential hypertension: Blood pressure averaging higher at home but normal here today, continue Clonidine 0.1 mg daily and continue to monitor at home. Follow up here in 1 month to recheck.    Return in about 4 weeks (around 05/15/2022).    Teodora Medici, DO

## 2022-04-17 NOTE — Patient Instructions (Addendum)
It was great seeing you today!  Plan discussed at today's visit: -Finish antibiotics -Continue bland, soft diet until pain starts to get better, then increase dietary fiber as you can tolerate  -Keep appointment with surgery on 04/22/22  Follow up in: 1 month or sooner as needed  Take care and let us know if you have any questions or concerns prior to your next visit.  Dr. Rosana Berger  Diverticulitis  Diverticulitis is when small pouches in your colon (large intestine) get infected or swollen. This causes pain in the belly (abdomen) and watery poop (diarrhea). These pouches are called diverticula. The pouches form in people who have a condition called diverticulosis. What are the causes? This condition may be caused by poop (stool) that gets trapped in the pouches in your colon. The poop lets germs (bacteria) grow in the pouches. This causes the infection. What increases the risk? You are more likely to get this condition if you have small pouches in your colon. The risk is higher if: You are overweight or very overweight (obese). You do not exercise enough. You drink alcohol. You smoke or use products with tobacco in them. You eat a diet that has a lot of red meat such as beef, pork, or lamb. You eat a diet that does not have enough fiber in it. You are older than 53 years of age. What are the signs or symptoms? Pain in the belly. Pain is often on the left side, but it may be in other areas. Fever and feeling cold. Feeling like you may vomit. Vomiting. Having cramps. Feeling full. Changes to how often you poop. Blood in your poop. How is this treated? Most cases are treated at home by: Taking over-the-counter pain medicines. Following a clear liquid diet. Taking antibiotic medicines. Resting. Very bad cases may need to be treated at a hospital. This may include: Not eating or drinking. Taking prescription pain medicine. Getting antibiotic medicines through an IV  tube. Getting fluid and food through an IV tube. Having surgery. When you are feeling better, your doctor may tell you to have a test to check your colon (colonoscopy). Follow these instructions at home: Medicines Take over-the-counter and prescription medicines only as told by your doctor. These include: Antibiotics. Pain medicines. Fiber pills. Probiotics. Stool softeners. If you were prescribed an antibiotic medicine, take it as told by your doctor. Do not stop taking the antibiotic even if you start to feel better. Ask your doctor if the medicine prescribed to you requires you to avoid driving or using machinery. Eating and drinking  Follow a diet as told by your doctor. When you feel better, your doctor may tell you to change your diet. You may need to eat a lot of fiber. Fiber makes it easier to poop (have a bowel movement). Foods with fiber include: Berries. Beans. Lentils. Green vegetables. Avoid eating red meat. General instructions Do not use any products that contain nicotine or tobacco, such as cigarettes, e-cigarettes, and chewing tobacco. If you need help quitting, ask your doctor. Exercise 3 or more times a week. Try to get 30 minutes each time. Exercise enough to sweat and make your heart beat faster. Keep all follow-up visits as told by your doctor. This is important. Contact a doctor if: Your pain does not get better. You are not pooping like normal. Get help right away if: Your pain gets worse. Your symptoms do not get better. Your symptoms get worse very fast. You have a fever. You vomit  more than one time. You have poop that is: Bloody. Black. Tarry. Summary This condition happens when small pouches in your colon get infected or swollen. Take medicines only as told by your doctor. Follow a diet as told by your doctor. Keep all follow-up visits as told by your doctor. This is important. This information is not intended to replace advice given to you by  your health care provider. Make sure you discuss any questions you have with your health care provider. Document Revised: 06/12/2019 Document Reviewed: 06/12/2019 Elsevier Patient Education  [place].

## 2022-04-20 DIAGNOSIS — Z7689 Persons encountering health services in other specified circumstances: Secondary | ICD-10-CM | POA: Diagnosis not present

## 2022-04-20 DIAGNOSIS — M1712 Unilateral primary osteoarthritis, left knee: Secondary | ICD-10-CM | POA: Diagnosis not present

## 2022-04-20 DIAGNOSIS — S8002XA Contusion of left knee, initial encounter: Secondary | ICD-10-CM | POA: Diagnosis not present

## 2022-04-21 ENCOUNTER — Ambulatory Visit (INDEPENDENT_AMBULATORY_CARE_PROVIDER_SITE_OTHER): Payer: Medicaid Other | Admitting: Family Medicine

## 2022-04-21 ENCOUNTER — Encounter: Payer: Self-pay | Admitting: Family Medicine

## 2022-04-21 VITALS — BP 112/76 | HR 92 | Temp 98.3°F | Resp 16 | Ht 61.0 in | Wt 170.7 lb

## 2022-04-21 DIAGNOSIS — Z23 Encounter for immunization: Secondary | ICD-10-CM

## 2022-04-21 DIAGNOSIS — M25511 Pain in right shoulder: Secondary | ICD-10-CM

## 2022-04-21 DIAGNOSIS — Z7689 Persons encountering health services in other specified circumstances: Secondary | ICD-10-CM | POA: Diagnosis not present

## 2022-04-21 MED ORDER — SHINGRIX 50 MCG/0.5ML IM SUSR: 0.5000 mL | Freq: Once | INTRAMUSCULAR | 0 refills | Status: AC

## 2022-04-21 NOTE — Progress Notes (Signed)
Patient ID: Kristina Reed, female    DOB: 1965/10/01, 56 y.o.   MRN: [id number]  PCP: Teodora Medici, DO  Chief Complaint  Patient presents with   Shoulder Pain    Right shoulder on going for 2-3 days. Denies injuring it     Subjective:   Kristina Reed is a 56 y.o. female, presents to clinic with CC of the following:  HPI  Right shoulder pain onset 3 days ago, woke up with pain, no injury or strain  Per chart review she has seen ortho for same and PT She is on diclofenac/mobic and hasn't been using, also topical lidocaine and muscle relaxers Nothing improves her pain and it is worse with movement   Patient Active Problem List   Diagnosis Date Noted   S/P TKR (total knee replacement) 11/25/2021   S/P TKR (total knee replacement) using cement, right 11/24/2021   Erosive esophagitis    Gastric erosion determined by endoscopy    Duodenal erythema    History of colonic polyps    Erythema of rectum    Chronic pain syndrome 11/26/2020   Bilateral primary osteoarthritis of knee 11/26/2020   Grade I internal hemorrhoids 11/16/2019   Genetic testing 04/20/2019   Family history of breast cancer    History of cervical cancer    Mastalgia 03/17/2019   Family hx-breast malignancy 12/16/2018   Dysphagia    Essential hypertension 01/19/2018   Obesity (BMI 30.0-34.9) 07/08/8526   Eosinophilic esophagitis 78/24/2353   Apophysitis 08/26/2017   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   Pain in joint, multiple sites 07/05/2017   Osteoarthritis of knee 61/44/3154   Lichen sclerosus et atrophicus 04/27/2017   Headache 04/27/2017   Headache disorder 02/17/2017   Post-concussion headache 02/17/2017   Medication monitoring encounter 09/21/2016   Hematuria 04/23/2016   Hx of cervical cancer 01/30/2016   Menopause 01/30/2016   Status post bilateral breast biopsy 01/07/2016   Rectal bleeding 04/30/2015   Chronic pain of both knees 07/23/2014   Depression, major, recurrent, moderate ([place])  02/02/2014   Adult hypothyroidism 02/02/2014   Obstructive apnea 02/02/2014   Anxiety and depression 02/02/2014   Incomplete bladder emptying 12/12/2012   Tenosynovitis of foot 05/30/2012   Benign neoplasm of kidney 05/13/2012   Urge incontinence 05/13/2012   FOM (frequency of micturition) 05/13/2012   Chronic pain of right hand 05/05/2012   Tarsal tunnel syndrome 03/03/2012      Current Outpatient Medications:    aspirin 81 MG chewable tablet, Chew 1 tablet (81 mg total) by mouth 2 (two) times daily., Disp: 60 tablet, Rfl: 0   cloNIDine (CATAPRES - DOSED IN MG/24 HR) 0.2 mg/24hr patch, Place 1 patch (0.2 mg total) onto the skin once a week., Disp: , Rfl:    cloNIDine (CATAPRES) 0.1 MG tablet, Take 1 tablet by mouth daily., Disp: , Rfl:    diclofenac (VOLTAREN) 75 MG EC tablet, Take 1 tablet by mouth 2 (two) times daily., Disp: , Rfl:    dicyclomine (BENTYL) 20 MG tablet, Take 1 tablet (20 mg total) by mouth 3 (three) times daily as needed for spasms (abd cramping)., Disp: 20 tablet, Rfl: 0   levothyroxine (SYNTHROID) 75 MCG tablet, Take 1 tablet (75 mcg total) by mouth daily before breakfast., Disp: 30 tablet, Rfl: 0   lidocaine (LIDODERM) 5 %, APPLY 1 PATCH BY TOPICAL ROUTE ONCE DAILY (MAY WEAR UP TO 12HOURS.), Disp: , Rfl:    linaclotide (LINZESS) 290 MCG CAPS capsule, TAKE ONE CAPSULE BY  MOUTH EVERY MORNING BEFORE BREAKFAST, Disp: , Rfl:    meloxicam (MOBIC) 15 MG tablet, Take 15 mg by mouth daily., Disp: , Rfl:    methocarbamol (ROBAXIN) 500 MG tablet, TAKE 1 TABLET BY MOUTH EVERY 6 HOURS AS NEEDED FOR MUSCLE SPASM, Disp: , Rfl:    levofloxacin (LEVAQUIN) 750 MG tablet, Take 1 tablet (750 mg total) by mouth daily. (Patient not taking: Reported on 04/17/2022), Disp: 7 tablet, Rfl: 0   Allergies  Allergen Reactions   Prednisone Nausea And Vomiting   Methylprednisolone Palpitations    Increased heart rate   Percocet [Oxycodone-Acetaminophen] Palpitations     Social History    Tobacco Use   Smoking status: Never   Smokeless tobacco: Never  Vaping Use   Vaping Use: Never used  Substance Use Topics   Alcohol use: Not Currently   Drug use: Not Currently      Chart Review Today: I have reviewed the patient's medical history in detail and updated the computerized patient record.   Review of Systems  Constitutional: Negative.   HENT: Negative.    Eyes: Negative.   Respiratory: Negative.    Cardiovascular: Negative.   Gastrointestinal: Negative.   Endocrine: Negative.   Genitourinary: Negative.   Musculoskeletal: Negative.   Skin: Negative.   Allergic/Immunologic: Negative.   Neurological: Negative.   Hematological: Negative.   Psychiatric/Behavioral: Negative.    All other systems reviewed and are negative.      Objective:   Vitals:   04/21/22 0904  BP: 112/76  Pulse: 92  Resp: 16  Temp: 98.3 F (36.8 C)  TempSrc: Oral  SpO2: 98%  Weight: 170 lb 11.2 oz (77.4 kg)  Height: '5\' 1"'$  (1.549 m)    Body mass index is 32.25 kg/m.  Physical Exam Vitals and nursing note reviewed.  Constitutional:      Appearance: She is well-developed.  HENT:     Head: Normocephalic and atraumatic.     Nose: Nose normal.  Eyes:     General:        Right eye: No discharge.        Left eye: No discharge.     Conjunctiva/sclera: Conjunctivae normal.  Neck:     Trachea: No tracheal deviation.  Cardiovascular:     Rate and Rhythm: Normal rate and regular rhythm.  Pulmonary:     Effort: Pulmonary effort is normal. No respiratory distress.     Breath sounds: No stridor.  Musculoskeletal:     Right shoulder: Tenderness present. No swelling, deformity, effusion or crepitus. Decreased range of motion. Normal strength. Normal pulse.     Comments: Right shoulder limited flexion - will not go above 90 degrees, decreased internal rotation, unable to do apleys scratch test Pain with empty can test Generalized ttp to glenohumeral fossa, deltoid, and along  scapula No biceps groove, AC, [place]   Skin:    General: Skin is warm and dry.     Findings: No rash.  Neurological:     Mental Status: She is alert.     Motor: No abnormal muscle tone.     Coordination: Coordination normal.  Psychiatric:        Behavior: Behavior normal.     Results for orders placed or performed during the hospital encounter of 04/08/22  Comprehensive Metabolic Panel (CMET)  Result Value Ref Range   Sodium 139 135 - 145 mmol/L   Potassium 3.8 3.5 - 5.1 mmol/L   Chloride 104 98 - 111 mmol/L   CO2  25 22 - 32 mmol/L   Glucose, Bld 107 (H) 70 - 99 mg/dL   BUN 10 6 - 20 mg/dL   Creatinine, Ser 0.94 0.44 - 1.00 mg/dL   Calcium 9.1 8.9 - 10.3 mg/dL   Total Protein 7.8 6.5 - 8.1 g/dL   Albumin 4.0 3.5 - 5.0 g/dL   AST 55 (H) 15 - 41 U/L   ALT 46 (H) 0 - 44 U/L   Alkaline Phosphatase 87 38 - 126 U/L   Total Bilirubin 0.6 0.3 - 1.2 mg/dL   GFR, Estimated >60 >60 mL/min   Anion gap 10 5 - 15  CBC  Result Value Ref Range   WBC 7.6 4.0 - 10.5 K/uL   RBC 4.63 3.87 - 5.11 MIL/uL   Hemoglobin 12.6 12.0 - 15.0 g/dL   HCT 38.7 36.0 - 46.0 %   MCV 83.6 80.0 - 100.0 fL   MCH 27.2 26.0 - 34.0 pg   MCHC 32.6 30.0 - 36.0 g/dL   RDW 13.6 11.5 - 15.5 %   Platelets 302 150 - 400 K/uL   nRBC 0.0 0.0 - 0.2 %       Assessment & Plan:     ICD-10-CM   1. Right shoulder pain, unspecified chronicity  M25.511 CANCELED: Ambulatory referral to Orthopedics    2. Need for shingles vaccine  Z23 Zoster Vaccine Adjuvanted [facility]) injection    Pt presents with right shoulder pain, worse x 3-4 days, she is currently being seen by ortho and PT, they have follow up arranged with her, she has not been taking her mobic, encouraged her to take and use peds from specialist - rest arm and f/up with them - like of flare of known issues      Delsa Grana, PA-C 04/21/22 9:31 AM

## 2022-04-21 NOTE — Patient Instructions (Signed)
Take mobic once daily for the next several days Call [facility] to get a follow up appointment ASAP with the PA - Jones for follow up on your right shoulder

## 2022-04-22 ENCOUNTER — Ambulatory Visit (INDEPENDENT_AMBULATORY_CARE_PROVIDER_SITE_OTHER): Payer: Medicaid Other | Admitting: Surgery

## 2022-04-22 ENCOUNTER — Encounter: Payer: Self-pay | Admitting: Surgery

## 2022-04-22 ENCOUNTER — Other Ambulatory Visit: Payer: Self-pay

## 2022-04-22 VITALS — BP 150/94 | HR 90 | Temp 98.4°F | Ht 61.0 in | Wt 170.8 lb

## 2022-04-22 DIAGNOSIS — K5720 Diverticulitis of large intestine with perforation and abscess without bleeding: Secondary | ICD-10-CM

## 2022-04-22 DIAGNOSIS — Z7689 Persons encountering health services in other specified circumstances: Secondary | ICD-10-CM | POA: Diagnosis not present

## 2022-04-22 MED ORDER — AMOXICILLIN-POT CLAVULANATE 875-125 MG PO TABS: 1.0000 | ORAL_TABLET | Freq: Two times a day (BID) | ORAL | 0 refills | Status: DC

## 2022-04-22 NOTE — Progress Notes (Signed)
04/22/2022  History of Present Illness: Kristina Reed is a 56 y.o. female presenting for follow-up of acute diverticulitis with microperforation.  She was last seen on 04/08/2022 after a CT scan was done the previous day showing acute sigmoid diverticulitis with microperforation.  She had been started by her PCP on Levaquin and Flagyl but the patient reports that she did not really take her antibiotic course after she had forgotten a few doses.  After she was seen by her PCP again on 8//23, the patient resumed her antibiotic course and completed it.  However, the patient continues having left lower quadrant abdominal pain.  She grades it as a 10 out of 10, but overall she does not look uncomfortable.  She is tolerating a diet, having bowel movements although she does have a history of constipation has a bowel movement sometimes every couple of days to every 4 days.  Denies any nausea or vomiting.  Past Medical History: Past Medical History:  Diagnosis Date   Anxiety    Arthritis    right knee and right elbow   Cancer ([place])    Cervical CA with partial hysterectomy.   Cognitive impairment, mild, so stated    Depression    Diverticulosis    Eosinophilic esophagitis 16/06/9603   Biopsy Dec 2018   Esophageal dysphagia    Family history of breast cancer 05/2021   cancer genetic testing letter sent   Gallstones    GERD (gastroesophageal reflux disease)    History of cervical cancer    Hx of cervical cancer 01/30/2016   Hypertension    Hypothyroidism    Sleep apnea    does not use a C-PAP   Status post partial hysterectomy    Due to Cervical CA   Vaginal inclusion cyst      Past Surgical History: Past Surgical History:  Procedure Laterality Date   ABDOMINAL HYSTERECTOMY     partial hysterectomy   BREAST BIOPSY Bilateral 01/01/2016   Fibroadenoma   COLONOSCOPY WITH PROPOFOL N/A 06/27/2015   Procedure: COLONOSCOPY WITH PROPOFOL;  Surgeon: Josefine Class, MD;  Location[facility]  ENDOSCOPY;  Service: Endoscopy;  Laterality: N/A;   COLONOSCOPY WITH PROPOFOL N/A 09/23/2017   Procedure: COLONOSCOPY WITH PROPOFOL;  Surgeon: Jonathon Bellows, MD;  Location: [facility] ENDOSCOPY;  Service: Gastroenterology;  Laterality: N/A;   COLONOSCOPY WITH PROPOFOL N/A 06/09/2021   Procedure: COLONOSCOPY WITH PROPOFOL;  Surgeon: Lin Landsman, MD;  Location: [facility] ENDOSCOPY;  Service: Gastroenterology;  Laterality: N/A;   ESOPHAGOGASTRODUODENOSCOPY (EGD) WITH PROPOFOL N/A 07/22/2017   Procedure: ESOPHAGOGASTRODUODENOSCOPY (EGD) WITH PROPOFOL;  Surgeon: Jonathon Bellows, MD;  Location[facility] ENDOSCOPY;  Service: Gastroenterology;  Laterality: N/A;   ESOPHAGOGASTRODUODENOSCOPY (EGD) WITH PROPOFOL N/A 03/31/2018   Procedure: ESOPHAGOGASTRODUODENOSCOPY (EGD) WITH PROPOFOL;  Surgeon: Lin Landsman, MD;  Location[facility] ENDOSCOPY;  Service: Gastroenterology;  Laterality: N/A;   ESOPHAGOGASTRODUODENOSCOPY (EGD) WITH PROPOFOL N/A 08/25/2021   Procedure: ESOPHAGOGASTRODUODENOSCOPY (EGD) WITH PROPOFOL;  Surgeon: Lin Landsman, MD;  Location: [facility] ENDOSCOPY;  Service: Gastroenterology;  Laterality: N/A;   KNEE ARTHROSCOPY Right 07/22/2016   Procedure: ARTHROSCOPY KNEE debridement microfracture;  Surgeon: Leanor Kail, MD;  Location: [facility] ORS;  Service: Orthopedics;  Laterality: Right;   KNEE ARTHROSCOPY WITH MEDIAL MENISECTOMY Left 11/10/2017   Procedure: KNEE ARTHROSCOPY WITH PARTIAL MEDIAL MENISECTOMY&patella femoral debridement, & ablation;  Surgeon: Leanor Kail, MD;  Location: [facility] ORS;  Service: Orthopedics;  Laterality: Left;   TOTAL KNEE ARTHROPLASTY Right 11/24/2021   Procedure: TOTAL KNEE ARTHROPLASTY;  Surgeon: Harlow Mares,  Elyn Aquas, MD;  Location: [facility] ORS;  Service: Orthopedics;  Laterality: Right;   TUBAL LIGATION      Home Medications: Prior to Admission medications   Medication Sig Start Date End Date Taking? Authorizing Provider  amoxicillin-clavulanate (AUGMENTIN) 875-125 MG tablet Take  1 tablet by mouth 2 (two) times daily. 04/22/22  Yes Janai Brannigan, Jacqulyn Bath, MD  aspirin 81 MG chewable tablet Chew 1 tablet (81 mg total) by mouth 2 (two) times daily. 11/25/21  Yes Carlynn Spry, PA-C  cloNIDine (CATAPRES - DOSED IN MG/24 HR) 0.2 mg/24hr patch Place 1 patch (0.2 mg total) onto the skin once a week.   Yes [provider]  cloNIDine (CATAPRES) 0.1 MG tablet Take 1 tablet by mouth daily.   Yes [provider]  diclofenac (VOLTAREN) 75 MG EC tablet Take 1 tablet by mouth 2 (two) times daily.   Yes [provider]  dicyclomine (BENTYL) 20 MG tablet Take 1 tablet (20 mg total) by mouth 3 (three) times daily as needed for spasms (abd cramping). 04/01/22  Yes Bo Merino, FNP  levothyroxine (SYNTHROID) 75 MCG tablet Take 1 tablet (75 mcg total) by mouth daily before breakfast. 03/13/22  Yes Teodora Medici, DO  lidocaine (LIDODERM) 5 % APPLY 1 PATCH BY TOPICAL ROUTE ONCE DAILY (MAY WEAR UP TO 12HOURS.) 04/22/21  Yes [provider]  linaclotide (LINZESS) 290 MCG CAPS capsule TAKE ONE CAPSULE BY MOUTH EVERY MORNING BEFORE BREAKFAST   Yes [provider]  meloxicam (MOBIC) 15 MG tablet Take 15 mg by mouth daily. 02/04/22  Yes [provider]  methocarbamol (ROBAXIN) 500 MG tablet TAKE 1 TABLET BY MOUTH EVERY 6 HOURS AS NEEDED FOR MUSCLE SPASM   Yes [provider]    Allergies: Allergies  Allergen Reactions   Prednisone Nausea And Vomiting   Methylprednisolone Palpitations    Increased heart rate   Percocet [Oxycodone-Acetaminophen] Palpitations    Review of Systems: Review of Systems  Constitutional:  Negative for chills and fever.  Respiratory:  Negative for shortness of breath.   Cardiovascular:  Negative for chest pain.  Gastrointestinal:  Positive for abdominal pain and constipation. Negative for nausea and vomiting.    Physical Exam BP (!) 150/94   Pulse 90   Temp 98.4 F (36.9 C) (Oral)   Ht '5\' 1"'$  (1.549 m)   Wt  170 lb 12.8 oz (77.5 kg)   LMP 05/09/1993 (Approximate)   SpO2 97%   BMI 32.27 kg/m  CONSTITUTIONAL: No acute distress, well-nourished HEENT:  Normocephalic, atraumatic, extraocular motion intact. RESPIRATORY:  Lungs are clear, and breath sounds are equal bilaterally. Normal respiratory effort without pathologic use of accessory muscles. CARDIOVASCULAR: Heart is regular without murmurs, gallops, or rubs. GI: The abdomen is soft, nondistended, with some tenderness to palpation of the left lower quadrant.  Patient does not appear peritonitic.  NEUROLOGIC:  Motor and sensation is grossly normal.  Cranial nerves are grossly intact. PSYCH:  Alert and oriented to person, place and time. Affect is normal.  Labs/Imaging: Labs from 04/08/2022: Sodium 139, potassium 3.8, chloride 104, CO2 25, BUN 10, creatinine 0.94.  Total bilirubin 0.6, AST 55, ALT 46, alkaline phosphatase 87.  White blood cell count 7.6, hemoglobin 12.6, hematocrit 38.7, platelets 302.  CT abdomen/pelvis on 04/07/2022: IMPRESSION: Changes of active diverticulitis in the proximal sigmoid colon with micro perforation. No frank free air noted elsewhere in the abdomen or pelvis.   Fatty liver.  Assessment and Plan: This is a 56 y.o. female with  acute diverticulitis with microperforation.  - Discussed with patient likely the reason that she still having pain is because she never really took an appropriate course of antibiotics either time.  She is tolerating a diet, is not having fevers, and on exam, she does not appear toxic.  Although she describes a lot of pain, she does not act like she having a lot of pain.  Discussed with her that we will send her a new prescription for new antibiotic course which would be Augmentin twice daily for 2 weeks as a precaution.  I will see her in 2 weeks as well and if there is no improvement at that point, we may need to repeat CT scan or send her to the hospital if there is any worsening.  Return  precautions given.  All the questions have been answered.  I spent 30 minutes dedicated to the care of this patient on the date of this encounter to include pre-visit review of records, face-to-face time with the patient discussing diagnosis and management, and any post-visit coordination of care.   Melvyn Neth, [facility]

## 2022-04-22 NOTE — Patient Instructions (Addendum)
Pick up your medication today at the pharmacy.  Start taking it today. Be sure to take the full course. Take Miralax daily to help with bowel movements.   Please call the office with any questions or concerns.   Diverticulitis  Diverticulitis is infection or inflammation of small pouches (diverticula) in the colon that form due to a condition called diverticulosis. Diverticula can trap stool (feces) and bacteria, causing infection and inflammation. Diverticulitis may cause severe stomach pain and diarrhea. It may lead to tissue damage in the colon that causes bleeding or blockage. The diverticula may also burst (rupture) and cause infected stool to enter other areas of the abdomen. What are the causes? This condition is caused by stool becoming trapped in the diverticula, which allows bacteria to grow in the diverticula. This leads to inflammation and infection. What increases the risk? You are more likely to develop this condition if you have diverticulosis. The risk increases if you: Are overweight or obese. Do not get enough exercise. Drink alcohol. Use tobacco products. Eat a diet that has a lot of red meat such as beef, pork, or lamb. Eat a diet that does not include enough fiber. High-fiber foods include fruits, vegetables, beans, nuts, and whole grains. Are over 18 years of age. What are the signs or symptoms? Symptoms of this condition may include: Pain and tenderness in the abdomen. The pain is normally located on the left side of the abdomen, but it may occur in other areas. Fever and chills. Nausea. Vomiting. Cramping. Bloating. Changes in bowel routines. Blood in your stool. How is this diagnosed? This condition is diagnosed based on: Your medical history. A physical exam. Tests to make sure there is nothing else causing your condition. These tests may include: Blood tests. Urine tests. CT scan of the abdomen. How is this treated? Most cases of this condition are  mild and can be treated at home. Treatment may include: Taking over-the-counter pain medicines. Following a clear liquid diet. Taking antibiotic medicines by mouth. Resting. More severe cases may need to be treated at a hospital. Treatment may include: Not eating or drinking. Taking prescription pain medicine. Receiving antibiotic medicines through an IV. Receiving fluids and nutrition through an IV. Surgery. When your condition is under control, your health care provider may recommend that you have a colonoscopy. This is an exam to look at the entire large intestine. During the exam, a lubricated, bendable tube is inserted into the anus and then passed into the rectum, colon, and other parts of the large intestine. A colonoscopy can show how severe your diverticula are and whether something else may be causing your symptoms. Follow these instructions at home: Medicines Take over-the-counter and prescription medicines only as told by your health care provider. These include fiber supplements, probiotics, and stool softeners. If you were prescribed an antibiotic medicine, take it as told by your health care provider. Do not stop taking the antibiotic even if you start to feel better. Ask your health care provider if the medicine prescribed to you requires you to avoid driving or using machinery. Eating and drinking  Follow a full liquid diet or another diet as directed by your health care provider. After your symptoms improve, your health care provider may tell you to change your diet. He or she may recommend that you eat a diet that contains at least 25 grams (25 g) of fiber daily. Fiber makes it easier to pass stool. Healthy sources of fiber include: Berries. One cup contains  4-8 grams of fiber. Beans or lentils. One-half cup contains 5-8 grams of fiber. Green vegetables. One cup contains 4 grams of fiber. Avoid eating red meat. General instructions Do not use any products that contain  nicotine or tobacco, such as cigarettes, e-cigarettes, and chewing tobacco. If you need help quitting, ask your health care provider. Exercise for at least 30 minutes, 3 times each week. You should exercise hard enough to raise your heart rate and break a sweat. Keep all follow-up visits as told by your health care provider. This is important. You may need to have a colonoscopy. Contact a health care provider if: Your pain does not improve. Your bowel movements do not return to normal. Get help right away if: Your pain gets worse. Your symptoms do not get better with treatment. Your symptoms suddenly get worse. You have a fever. You vomit more than one time. You have stools that are bloody, black, or tarry. Summary Diverticulitis is infection or inflammation of small pouches (diverticula) in the colon that form due to a condition called diverticulosis. Diverticula can trap stool (feces) and bacteria, causing infection and inflammation. You are at higher risk for this condition if you have diverticulosis and you eat a diet that does not include enough fiber. Most cases of this condition are mild and can be treated at home. More severe cases may need to be treated at a hospital. When your condition is under control, your health care provider may recommend that you have an exam called a colonoscopy. This exam can show how severe your diverticula are and whether something else may be causing your symptoms. Keep all follow-up visits as told by your health care provider. This is important. This information is not intended to replace advice given to you by your health care provider. Make sure you discuss any questions you have with your health care provider. Document Revised: 06/12/2019 Document Reviewed: 06/12/2019 Elsevier Patient Education  [place].

## 2022-04-24 ENCOUNTER — Encounter: Payer: Self-pay | Admitting: Family Medicine

## 2022-05-01 DIAGNOSIS — M25531 Pain in right wrist: Secondary | ICD-10-CM | POA: Diagnosis not present

## 2022-05-01 DIAGNOSIS — M7541 Impingement syndrome of right shoulder: Secondary | ICD-10-CM | POA: Diagnosis not present

## 2022-05-01 DIAGNOSIS — Z7689 Persons encountering health services in other specified circumstances: Secondary | ICD-10-CM | POA: Diagnosis not present

## 2022-05-08 ENCOUNTER — Ambulatory Visit: Payer: Medicaid Other | Admitting: Surgery

## 2022-05-12 ENCOUNTER — Ambulatory Visit
Admission: RE | Admit: 2022-05-12 | Discharge: 2022-05-12 | Disposition: A | Discharge status: Home / Self Care | Payer: Medicaid Other | Source: Ambulatory Visit | Attending: Internal Medicine | Admitting: Internal Medicine

## 2022-05-12 DIAGNOSIS — Z1231 Encounter for screening mammogram for malignant neoplasm of breast: Secondary | ICD-10-CM | POA: Diagnosis not present

## 2022-05-12 DIAGNOSIS — Z7689 Persons encountering health services in other specified circumstances: Secondary | ICD-10-CM | POA: Diagnosis not present

## 2022-05-13 ENCOUNTER — Other Ambulatory Visit: Payer: Self-pay | Admitting: Family Medicine

## 2022-05-14 ENCOUNTER — Telehealth: Payer: Self-pay

## 2022-05-14 NOTE — Telephone Encounter (Signed)
Fax refill request received from [facility] for Estradiol 0.'1MG'$ /D [facility] DIS Last Filled: 11/12/21  QTY: 8

## 2022-05-14 NOTE — Telephone Encounter (Signed)
Returned fax: Denied with note: Patient past due for annual. Last Annual 04/21/21

## 2022-05-15 DIAGNOSIS — Z419 Encounter for procedure for purposes other than remedying health state, unspecified: Secondary | ICD-10-CM | POA: Diagnosis not present

## 2022-05-19 NOTE — Progress Notes (Unsigned)
Established Patient Office Visit  Subjective   Patient ID: Kristina Reed, female    DOB: 1965-09-17  Age: 56 y.o. MRN: [id number]  No chief complaint on file.   HPI Kristina Reed is a 56 year old female here for follow up on recent diverticulitis and blood pressure.   She was initially seen in this office on 04/02/22 complaining of LLQ pain x 1 day and constipation. Urine in the office was negative for infection but showing blood. CBC, CMP normal. CT abdomen/pelvis on 04/07/22 showing changes in active diverticulitis in the proximal sigmoid colon with micro perforation without frank air noted. She was given a course of Levaquin 750 mg and Flagyl 500 TID x 7 days, which she states today she did not finish. She started the medication last week but forgot and missed several doses and eventually stopped taking it. She denies side effects from the medication. She was evaluated by surgery on 04/08/22, note reviewed. Plan to keep patient outpatient with bowel rest. Today she states that her pain is getting worse. It is located in LLQ, pain will sometimes double her over. Does not radiate. Appetite good, eating salads. Denies fevers. No nausea, vomiting, diarrhea, constipation, blood in stool or urinary issues.   Hypertension: -Medications: Clonidine 0.1 mg daily  -Patient is compliant with above medications and reports no side effects. -Checking BP at home (average): 130-145/70-90 -Denies any SOB, CP, vision changes, LE edema or symptoms of hypotension    Review of Systems  Constitutional:  Negative for chills and fever.  Gastrointestinal:  Positive for abdominal pain. Negative for blood in stool, constipation, diarrhea, heartburn, nausea and vomiting.  Genitourinary:  Negative for dysuria and hematuria.      Objective:     LMP 05/09/1993 (Approximate)  BP Readings from Last 3 Encounters:  04/22/22 (!) 150/94  04/21/22 112/76  04/17/22 122/88   Wt Readings from Last 3 Encounters:  04/22/22  170 lb 12.8 oz (77.5 kg)  04/21/22 170 lb 11.2 oz (77.4 kg)  04/17/22 170 lb 6.4 oz (77.3 kg)      Physical Exam Constitutional:      Appearance: Normal appearance.  HENT:     Head: Normocephalic and atraumatic.  Eyes:     Conjunctiva/sclera: Conjunctivae normal.  Cardiovascular:     Rate and Rhythm: Normal rate and regular rhythm.  Pulmonary:     Effort: Pulmonary effort is normal.     Breath sounds: Normal breath sounds.  Abdominal:     General: Bowel sounds are normal. There is no distension.     Palpations: Abdomen is soft. There is no mass.     Tenderness: There is abdominal tenderness. There is no right CVA tenderness, left CVA tenderness, guarding or rebound.     Hernia: No hernia is present.     Comments: Tenderness to palpation in the LLQ  Musculoskeletal:     Right lower leg: No edema.     Left lower leg: No edema.  Skin:    General: Skin is warm and dry.  Neurological:     General: No focal deficit present.     Mental Status: She is alert. Mental status is at baseline.  Psychiatric:        Mood and Affect: Mood normal.        Behavior: Behavior normal.     No results found for any visits on 05/20/22.  Last CBC Lab Results  Component Value Date   WBC 7.6 04/08/2022   HGB  12.6 04/08/2022   HCT 38.7 04/08/2022   MCV 83.6 04/08/2022   MCH 27.2 04/08/2022   RDW 13.6 04/08/2022   PLT 302 88/71/9597   Last metabolic panel Lab Results  Component Value Date   GLUCOSE 107 (H) 04/08/2022   NA 139 04/08/2022   K 3.8 04/08/2022   CL 104 04/08/2022   CO2 25 04/08/2022   BUN 10 04/08/2022   CREATININE 0.94 04/08/2022   GFRNONAA >60 04/08/2022   CALCIUM 9.1 04/08/2022   PROT 7.8 04/08/2022   ALBUMIN 4.0 04/08/2022   LABGLOB 2.9 01/13/2016   AGRATIO 1.6 01/13/2016   BILITOT 0.6 04/08/2022   ALKPHOS 87 04/08/2022   AST 55 (H) 04/08/2022   ALT 46 (H) 04/08/2022   ANIONGAP 10 04/08/2022   Last lipids Lab Results  Component Value Date   CHOL 220 (H)  03/26/2021   HDL 65 03/26/2021   LDLCALC 120 (H) 03/26/2021   TRIG 231 (H) 03/26/2021   CHOLHDL 3.4 03/26/2021   Last hemoglobin A1c Lab Results  Component Value Date   HGBA1C 5.8 (H) 03/03/2022   Last thyroid functions Lab Results  Component Value Date   TSH 6.82 (H) 03/11/2022   Last vitamin D No results found for: "25OHVITD2", "25OHVITD3", "VD25OH" Last vitamin B12 and Folate No results found for: "VITAMINB12", "FOLATE"    The 10-year ASCVD risk score (Arnett DK, et al., 2019) is: 3.1%    Assessment & Plan:   1. Diverticulitis: Patient did not finish antibiotic course and is eating more ruffage in her diet but still endorsing 10/10 LLQ pain. Recommend she finish the antibiotics but more importantly follow a soft bland diet. Eventually when her pain has improved she can increase dietary fiber. Abdominal exam benign, she is seeing general surgery next week.   2. Essential hypertension: Blood pressure averaging higher at home but normal here today, continue Clonidine 0.1 mg daily and continue to monitor at home. Follow up here in 1 month to recheck.    No follow-ups on file.    Teodora Medici, DO

## 2022-05-20 ENCOUNTER — Ambulatory Visit (INDEPENDENT_AMBULATORY_CARE_PROVIDER_SITE_OTHER): Payer: Medicaid Other | Admitting: Internal Medicine

## 2022-05-20 ENCOUNTER — Ambulatory Visit
Admission: RE | Admit: 2022-05-20 | Discharge: 2022-05-20 | Disposition: A | Discharge status: Home / Self Care | Payer: Medicaid Other | Source: Ambulatory Visit | Attending: Internal Medicine | Admitting: Internal Medicine

## 2022-05-20 ENCOUNTER — Ambulatory Visit
Admission: RE | Admit: 2022-05-20 | Discharge: 2022-05-20 | Disposition: A | Discharge status: Home / Self Care | Payer: Medicaid Other | Attending: Internal Medicine | Admitting: Internal Medicine

## 2022-05-20 ENCOUNTER — Encounter: Payer: Self-pay | Admitting: Internal Medicine

## 2022-05-20 VITALS — BP 124/78 | HR 103 | Temp 97.3°F | Resp 16 | Ht 61.0 in | Wt 165.2 lb

## 2022-05-20 DIAGNOSIS — I10 Essential (primary) hypertension: Secondary | ICD-10-CM | POA: Diagnosis not present

## 2022-05-20 DIAGNOSIS — R7989 Other specified abnormal findings of blood chemistry: Secondary | ICD-10-CM

## 2022-05-20 DIAGNOSIS — S6991XA Unspecified injury of right wrist, hand and finger(s), initial encounter: Secondary | ICD-10-CM | POA: Diagnosis not present

## 2022-05-20 DIAGNOSIS — M79641 Pain in right hand: Secondary | ICD-10-CM | POA: Insufficient documentation

## 2022-05-20 DIAGNOSIS — M25531 Pain in right wrist: Secondary | ICD-10-CM

## 2022-05-20 DIAGNOSIS — W19XXXA Unspecified fall, initial encounter: Secondary | ICD-10-CM

## 2022-05-20 DIAGNOSIS — K219 Gastro-esophageal reflux disease without esophagitis: Secondary | ICD-10-CM

## 2022-05-20 DIAGNOSIS — E039 Hypothyroidism, unspecified: Secondary | ICD-10-CM

## 2022-05-20 DIAGNOSIS — Z13220 Encounter for screening for lipoid disorders: Secondary | ICD-10-CM | POA: Diagnosis not present

## 2022-05-20 DIAGNOSIS — Z7689 Persons encountering health services in other specified circumstances: Secondary | ICD-10-CM | POA: Diagnosis not present

## 2022-05-20 MED ORDER — OMEPRAZOLE 20 MG PO CPDR: 20.0000 mg | DELAYED_RELEASE_CAPSULE | Freq: Every day | ORAL | 1 refills | Status: DC

## 2022-05-20 MED ORDER — LEVOTHYROXINE SODIUM 75 MCG PO TABS: 75.0000 ug | ORAL_TABLET | Freq: Every day | ORAL | 1 refills | Status: DC

## 2022-05-20 NOTE — Patient Instructions (Addendum)
It was great seeing you today!  Plan discussed at today's visit: -Blood work ordered today, results will be uploaded to [facility].  -Continue thyroid medicine, refilled today -Acid reflux medication sent to pharmacy -X-rays of hand and wrist today   Follow up in: 3 months   Take care and let us know if you have any questions or concerns prior to your next visit.  Dr. Rosana Berger

## 2022-05-21 LAB — COMPLETE METABOLIC PANEL WITH GFR
AG Ratio: 1.5 (calc) (ref 1.0–2.5)
ALT: 55 U/L — ABNORMAL HIGH (ref 6–29)
AST: 46 U/L — ABNORMAL HIGH (ref 10–35)
Albumin: 4.3 g/dL (ref 3.6–5.1)
Alkaline phosphatase (APISO): 75 U/L (ref 37–153)
BUN: 13 mg/dL (ref 7–25)
CO2: 25 mmol/L (ref 20–32)
Calcium: 9.5 mg/dL (ref 8.6–10.4)
Chloride: 103 mmol/L (ref 98–110)
Creat: 0.76 mg/dL (ref 0.50–1.03)
Globulin: 2.8 g/dL (calc) (ref 1.9–3.7)
Glucose, Bld: 79 mg/dL (ref 65–99)
Potassium: 3.9 mmol/L (ref 3.5–5.3)
Sodium: 141 mmol/L (ref 135–146)
Total Bilirubin: 0.3 mg/dL (ref 0.2–1.2)
Total Protein: 7.1 g/dL (ref 6.1–8.1)
eGFR: 93 mL/min/{1.73_m2} (ref >=60)

## 2022-05-21 LAB — LIPID PANEL
Cholesterol: 213 mg/dL — ABNORMAL HIGH (ref <200)
HDL: 78 mg/dL (ref >=50)
LDL Cholesterol (Calc): 112 mg/dL (calc) — ABNORMAL HIGH
Non-HDL Cholesterol (Calc): 135 mg/dL (calc) — ABNORMAL HIGH (ref <130)
Total CHOL/HDL Ratio: 2.7 (calc) (ref <5.0)
Triglycerides: 119 mg/dL (ref <150)

## 2022-05-21 LAB — TSH: TSH: 3.87 mIU/L

## 2022-05-25 DIAGNOSIS — Z7689 Persons encountering health services in other specified circumstances: Secondary | ICD-10-CM | POA: Diagnosis not present

## 2022-05-25 DIAGNOSIS — Z96651 Presence of right artificial knee joint: Secondary | ICD-10-CM | POA: Diagnosis not present

## 2022-05-25 DIAGNOSIS — M25531 Pain in right wrist: Secondary | ICD-10-CM | POA: Diagnosis not present

## 2022-05-25 DIAGNOSIS — M7541 Impingement syndrome of right shoulder: Secondary | ICD-10-CM | POA: Diagnosis not present

## 2022-05-27 ENCOUNTER — Ambulatory Visit: Payer: Medicaid Other | Admitting: Surgery

## 2022-06-14 DIAGNOSIS — Z419 Encounter for procedure for purposes other than remedying health state, unspecified: Secondary | ICD-10-CM | POA: Diagnosis not present

## 2022-06-15 ENCOUNTER — Ambulatory Visit
Admission: EM | Admit: 2022-06-15 | Discharge: 2022-06-15 | Disposition: A | Discharge status: Home / Self Care | Payer: Medicaid Other | Attending: Emergency Medicine | Admitting: Emergency Medicine

## 2022-06-15 ENCOUNTER — Ambulatory Visit: Payer: Medicaid Other | Admitting: Surgery

## 2022-06-15 DIAGNOSIS — U071 COVID-19: Secondary | ICD-10-CM | POA: Insufficient documentation

## 2022-06-15 LAB — SARS CORONAVIRUS 2 BY RT PCR: SARS Coronavirus 2 by RT PCR: POSITIVE — AB

## 2022-06-15 LAB — GROUP A STREP BY PCR: Group A Strep by PCR: NOT DETECTED

## 2022-06-15 MED ORDER — BENZONATATE 100 MG PO CAPS: 200.0000 mg | ORAL_CAPSULE | Freq: Three times a day (TID) | ORAL | 0 refills | Status: DC

## 2022-06-15 MED ORDER — IPRATROPIUM BROMIDE 0.06 % NA SOLN: 2.0000 | Freq: Four times a day (QID) | NASAL | 12 refills | Status: DC

## 2022-06-15 MED ORDER — PROMETHAZINE-DM 6.25-15 MG/5ML PO SYRP: 5.0000 mL | ORAL_SOLUTION | Freq: Four times a day (QID) | ORAL | 0 refills | Status: DC | PRN

## 2022-06-15 MED ORDER — NIRMATRELVIR/RITONAVIR (PAXLOVID)TABLET: 3.0000 | ORAL_TABLET | Freq: Two times a day (BID) | ORAL | 0 refills | Status: AC

## 2022-06-15 NOTE — Discharge Instructions (Addendum)
You have tested positive for COVID-19 today.  You will need to quarantine for 5 days from onset of your symptoms.  After 5 days you can break quarantine if your symptoms have improved and you have not had a fever without taking Tylenol or ibuprofen for 24 hours.  Take over-the-counter Tylenol and ibuprofen as needed for body aches and fever.  Take it according to the package instructions.  Take the Paxlovid twice daily for 5 days for treatment of COVID-19.  Use the Atrovent nasal spray, 2 squirts up each nostril every 6 hours, as needed for nasal congestion, runny nose, postnasal drip.  Use the Tessalon Perles every 8 hours during the day as needed for cough.  Take them with small sip of water.  They may give you some numbness to the base of your tongue or metallic taste in your mouth, this is normal.  Use the Promethazine DM cough syrup at bedtime as needed for cough and congestion.  This will make you drowsy but will drive her nasal secretions so he can get some rest and your body can heal.  If you develop any shortness of breath, especially at rest, you feel like you cannot catch your breath, you are unable to speak in full sentences, or late sign is your lips are turning blue you need to call 911 and go to the ER.

## 2022-06-15 NOTE — ED Provider Notes (Signed)
[facility]    CSN: [id number] Arrival date & time: 06/15/22  0841      History   Chief Complaint Chief Complaint  Patient presents with   Nasal Congestion   Cough   Sore Throat    HPI Kristina Reed is a 56 y.o. female.   HPI  57 year old female here for evaluation [place]ter complaints.  Patient ports her symptoms began last night and they consist of a subjective fever, runny nose with clear nasal discharge, sore throat, and a nonproductive cough.  She is also been sneezing.  She denies any shortness breath or wheezing, GI complaints, body aches, headache, or known sick contacts.  Past Medical History:  Diagnosis Date   Anxiety    Arthritis    right knee and right elbow   Cancer ([place])    Cervical CA with partial hysterectomy.   Cognitive impairment, mild, so stated    Depression    Diverticulosis    Eosinophilic esophagitis 00/17/4944   Biopsy Dec 2018   Esophageal dysphagia    Family history of breast cancer 05/2021   cancer genetic testing letter sent   Gallstones    GERD (gastroesophageal reflux disease)    History of cervical cancer    Hx of cervical cancer 01/30/2016   Hypertension    Hypothyroidism    Sleep apnea    does not use a C-PAP   Status post partial hysterectomy    Due to Cervical CA   Vaginal inclusion cyst     Patient Active Problem List   Diagnosis Date Noted   S/P TKR (total knee replacement) 11/25/2021   S/P TKR (total knee replacement) using cement, right 11/24/2021   Erosive esophagitis    Gastric erosion determined by endoscopy    Duodenal erythema    History of colonic polyps    Erythema of rectum    Chronic pain syndrome 11/26/2020   Bilateral primary osteoarthritis of knee 11/26/2020   Grade I internal hemorrhoids 11/16/2019   Genetic testing 04/20/2019   Family history of breast cancer    History of cervical cancer    Mastalgia 03/17/2019   Family hx-breast malignancy 12/16/2018   Dysphagia    Essential  hypertension 01/19/2018   Obesity (BMI 30.0-34.9) 96/75/9163   Eosinophilic esophagitis 84/66/5993   Apophysitis 08/26/2017   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   Pain in joint, multiple sites 07/05/2017   Osteoarthritis of knee 57/09/7791   Lichen sclerosus et atrophicus 04/27/2017   Headache 04/27/2017   Headache disorder 02/17/2017   Post-concussion headache 02/17/2017   Medication monitoring encounter 09/21/2016   Hematuria 04/23/2016   Hx of cervical cancer 01/30/2016   Menopause 01/30/2016   Status post bilateral breast biopsy 01/07/2016   Rectal bleeding 04/30/2015   Chronic pain of both knees 07/23/2014   Depression, major, recurrent, moderate ([place]) 02/02/2014   Adult hypothyroidism 02/02/2014   Obstructive apnea 02/02/2014   Anxiety and depression 02/02/2014   Incomplete bladder emptying 12/12/2012   Tenosynovitis of foot 05/30/2012   Benign neoplasm of kidney 05/13/2012   Urge incontinence 05/13/2012   FOM (frequency of micturition) 05/13/2012   Chronic pain of right hand 05/05/2012   Tarsal tunnel syndrome 03/03/2012    Past Surgical History:  Procedure Laterality Date   ABDOMINAL HYSTERECTOMY     partial hysterectomy   BREAST BIOPSY Bilateral 01/01/2016   Fibroadenoma   COLONOSCOPY WITH PROPOFOL N/A 06/27/2015   Procedure: COLONOSCOPY WITH PROPOFOL;  Surgeon: Josefine Class, MD;  Location: [facility] ENDOSCOPY;  Service: Endoscopy;  Laterality: N/A;   COLONOSCOPY WITH PROPOFOL N/A 09/23/2017   Procedure: COLONOSCOPY WITH PROPOFOL;  Surgeon: Jonathon Bellows, MD;  Location: [facility] ENDOSCOPY;  Service: Gastroenterology;  Laterality: N/A;   COLONOSCOPY WITH PROPOFOL N/A 06/09/2021   Procedure: COLONOSCOPY WITH PROPOFOL;  Surgeon: Lin Landsman, MD;  Location: [facility] ENDOSCOPY;  Service: Gastroenterology;  Laterality: N/A;   ESOPHAGOGASTRODUODENOSCOPY (EGD) WITH PROPOFOL N/A 07/22/2017   Procedure: ESOPHAGOGASTRODUODENOSCOPY (EGD) WITH PROPOFOL;  Surgeon: Jonathon Bellows, MD;   Location: [facility] ENDOSCOPY;  Service: Gastroenterology;  Laterality: N/A;   ESOPHAGOGASTRODUODENOSCOPY (EGD) WITH PROPOFOL N/A 03/31/2018   Procedure: ESOPHAGOGASTRODUODENOSCOPY (EGD) WITH PROPOFOL;  Surgeon: Lin Landsman, MD;  Location: [facility] ENDOSCOPY;  Service: Gastroenterology;  Laterality: N/A;   ESOPHAGOGASTRODUODENOSCOPY (EGD) WITH PROPOFOL N/A 08/25/2021   Procedure: ESOPHAGOGASTRODUODENOSCOPY (EGD) WITH PROPOFOL;  Surgeon: Lin Landsman, MD;  Location: [facility] ENDOSCOPY;  Service: Gastroenterology;  Laterality: N/A;   KNEE ARTHROSCOPY Right 07/22/2016   Procedure: ARTHROSCOPY KNEE debridement microfracture;  Surgeon: Leanor Kail, MD;  Location: [facility] ORS;  Service: Orthopedics;  Laterality: Right;   KNEE ARTHROSCOPY WITH MEDIAL MENISECTOMY Left 11/10/2017   Procedure: KNEE ARTHROSCOPY WITH PARTIAL MEDIAL MENISECTOMY&patella femoral debridement, & ablation;  Surgeon: Leanor Kail, MD;  Location: [facility] ORS;  Service: Orthopedics;  Laterality: Left;   TOTAL KNEE ARTHROPLASTY Right 11/24/2021   Procedure: TOTAL KNEE ARTHROPLASTY;  Surgeon: Lovell Sheehan, MD;  Location: [facility] ORS;  Service: Orthopedics;  Laterality: Right;   TUBAL LIGATION      OB History     Gravida  2   Para  2   Term  2   Preterm      AB      Living  2      SAB      IAB      Ectopic      Multiple      Live Births  2        Obstetric Comments  Menstrual age: 40  Age 1st Pregnancy: 21           Home Medications    Prior to Admission medications   Medication Sig Start Date End Date Taking? Authorizing Provider  aspirin 81 MG chewable tablet Chew 1 tablet (81 mg total) by mouth 2 (two) times daily. 11/25/21  Yes Carlynn Spry, PA-C  benzonatate (TESSALON) 100 MG capsule Take 2 capsules (200 mg total) by mouth every 8 (eight) hours. 06/15/22  Yes Margarette Canada, NP  cloNIDine (CATAPRES - DOSED IN MG/24 HR) 0.2 mg/24hr patch Place 1 patch (0.2 mg total) onto the skin once a week.    Yes [provider]  cloNIDine (CATAPRES) 0.1 MG tablet Take 1 tablet by mouth daily.   Yes [provider]  diclofenac (VOLTAREN) 75 MG EC tablet Take 1 tablet by mouth 2 (two) times daily.   Yes [provider]  ipratropium (ATROVENT) 0.06 % nasal spray Place 2 sprays into both nostrils 4 (four) times daily. 06/15/22  Yes Margarette Canada, NP  levothyroxine (SYNTHROID) 75 MCG tablet Take 1 tablet (75 mcg total) by mouth daily before breakfast. 05/20/22  Yes Teodora Medici, DO  lidocaine (LIDODERM) 5 % APPLY 1 PATCH BY TOPICAL ROUTE ONCE DAILY (MAY WEAR UP TO 12HOURS.) 04/22/21  Yes [provider]  linaclotide (LINZESS) 290 MCG CAPS capsule TAKE ONE CAPSULE BY MOUTH EVERY MORNING BEFORE BREAKFAST   Yes [provider]  meloxicam (MOBIC) 15 MG tablet Take 15 mg by mouth daily. 02/04/22  Yes  [provider]  methocarbamol (ROBAXIN) 500 MG tablet TAKE 1 TABLET BY MOUTH EVERY 6 HOURS AS NEEDED FOR MUSCLE SPASM   Yes [provider]  nirmatrelvir/ritonavir EUA (PAXLOVID) 20 x 150 MG & 10 x '100MG'$  TABS Take 3 tablets by mouth 2 (two) times daily for 5 days. 06/15/22 06/20/22 Yes Margarette Canada, NP  omeprazole (PRILOSEC) 20 MG capsule Take 1 capsule (20 mg total) by mouth daily. 05/20/22  Yes Teodora Medici, DO  promethazine-dextromethorphan (PROMETHAZINE-DM) 6.25-15 MG/5ML syrup Take 5 mLs by mouth 4 (four) times daily as needed. 06/15/22  Yes Margarette Canada, NP    Family History Family History  Problem Relation Age of Onset   Diabetes Mother    Lung cancer Father 70   Breast cancer Maternal Grandmother 76   Cancer Paternal Grandmother        cancer?   Breast cancer Maternal Aunt        80s   Cirrhosis Cousin    Breast cancer Cousin    Breast cancer Other        x4    Social History Social History   Tobacco Use   Smoking status: Never    Passive exposure: Never   Smokeless tobacco: Never  Vaping Use   Vaping Use: Never used   Substance Use Topics   Alcohol use: Not Currently   Drug use: Not Currently     Allergies   Prednisone, Methylprednisolone, and Percocet [oxycodone-acetaminophen]   Review of Systems Review of Systems  Constitutional:  Positive for fever.  HENT:  Positive for congestion, postnasal drip, rhinorrhea and sore throat. Negative for ear pain.   Respiratory:  Positive for cough. Negative for shortness of breath and wheezing.   Gastrointestinal:  Negative for diarrhea, nausea and vomiting.  Musculoskeletal:  Negative for arthralgias and myalgias.  Skin:  Negative for rash.  Neurological:  Negative for headaches.  Hematological: Negative.   Psychiatric/Behavioral: Negative.       Physical Exam Triage Vital Signs ED Triage Vitals  Enc Vitals Group     BP 06/15/22 0856 114/85     Pulse Rate 06/15/22 0856 96     Resp --      Temp 06/15/22 0856 98.4 F (36.9 C)     Temp Source 06/15/22 0856 Oral     SpO2 06/15/22 0856 98 %     Weight 06/15/22 0854 160 lb (72.6 kg)     Height 06/15/22 0854 5' 1.5" (1.562 m)     Head Circumference --      Peak Flow --      Pain Score 06/15/22 0854 10     Pain Loc --      Pain Edu? --      Excl. in [place]? --    No data found.  Updated Vital Signs BP 114/85 (BP Location: Left Arm)   Pulse 96   Temp 98.4 F (36.9 C) (Oral)   Ht 5' 1.5" (1.562 m)   Wt 160 lb (72.6 kg)   LMP 05/09/1993 (Approximate)   SpO2 98%   BMI 29.74 kg/m   Visual Acuity Right Eye Distance:   Left Eye Distance:   Bilateral Distance:    Right Eye Near:   Left Eye Near:    Bilateral Near:     Physical Exam Vitals and nursing note reviewed.  Constitutional:      Appearance: Normal appearance. She is not ill-appearing.  HENT:     Head: Normocephalic and atraumatic.  Right Ear: Tympanic membrane, ear canal and external ear normal. There is no impacted cerumen.     Left Ear: Tympanic membrane, ear canal and external ear normal. There is no impacted cerumen.      Ears:     Comments: Both TMs are pearly gray in appearance.  There is moderate cerumen in the external auditory canals bilaterally.    Nose: Congestion and rhinorrhea present.     Comments: His mucosa is edematous and mildly erythematous with clear discharge in both nares.    Mouth/Throat:     Mouth: Mucous membranes are moist.     Pharynx: Oropharynx is clear. Posterior oropharyngeal erythema present. No oropharyngeal exudate.     Comments: Posterior oropharynx is mildly erythematous and injected with clear postnasal drip.  Tonsillar pillars are unremarkable.  No exudate. Cardiovascular:     Rate and Rhythm: Normal rate and regular rhythm.     Pulses: Normal pulses.     Heart sounds: Normal heart sounds. No murmur heard.    No friction rub. No gallop.  Pulmonary:     Effort: Pulmonary effort is normal.     Breath sounds: Normal breath sounds. No wheezing, rhonchi or rales.  Musculoskeletal:     Cervical back: Normal range of motion and neck supple.  Lymphadenopathy:     Cervical: No cervical adenopathy.  Skin:    General: Skin is warm and dry.     Capillary Refill: Capillary refill takes less than 2 seconds.     Findings: No erythema or rash.  Neurological:     General: No focal deficit present.     Mental Status: She is alert and oriented to person, place, and time.  Psychiatric:        Mood and Affect: Mood normal.        Behavior: Behavior normal.        Thought Content: Thought content normal.        Judgment: Judgment normal.      UC Treatments / Results  Labs (all labs ordered are listed, but only abnormal results are displayed) Labs Reviewed  SARS CORONAVIRUS 2 BY RT PCR - Abnormal; Notable for the following components:      Result Value   SARS Coronavirus 2 by RT PCR POSITIVE (*)    All other components within normal limits  GROUP A STREP BY PCR    EKG   Radiology No results found.  Procedures Procedures (including critical care time)  Medications  Ordered in UC Medications - No data to display  Initial Impression / Assessment and Plan / UC Course  I have reviewed the triage vital signs and the nursing notes.  Pertinent labs & imaging results that were available during my care of the patient were reviewed by me and considered in my medical decision making (see chart for details).   Patient is a very pleasant, nontoxic-appearing 56 year old female here for evaluation of a combined upper and lower respiratory symptoms.  She denies any GI symptoms, body aches, or headache.  No known sick contacts.  Her physical exam does reveal erythematous and edematous nasal passages with clear nasal discharge and erythema in the posterior oropharynx with clear postnasal drip.  Physical exam does reveal the presence of an upper respiratory infection.  I will obtain strep and COVID PCR testing.  Strep PCR is negative.  COVID PCR is positive.  Patient has a CMP in epic from 05/20/2022 showing normal renal function.  Calculated GFR comes out to 79.3  mL/min.  I will start the patient on Paxlovid twice daily for 5 days for treatment of COVID-19.  I will also add Afrin nasal spray to help her with her congestion, Tessalon Perles and Promethazine DM cough syrup for cough and congestion.  ER and return precautions reviewed.   Final Clinical Impressions(s) / UC Diagnoses   Final diagnoses:  [id number]     Discharge Instructions      You have tested positive for COVID-19 today.  You will need to quarantine for 5 days from onset of your symptoms.  After 5 days you can break quarantine if your symptoms have improved and you have not had a fever without taking Tylenol or ibuprofen for 24 hours.  Take over-the-counter Tylenol and ibuprofen as needed for body aches and fever.  Take it according to the package instructions.  Take the Paxlovid twice daily for 5 days for treatment of COVID-19.  Use the Atrovent nasal spray, 2 squirts up each nostril every 6 hours, as  needed for nasal congestion, runny nose, postnasal drip.  Use the Tessalon Perles every 8 hours during the day as needed for cough.  Take them with small sip of water.  They may give you some numbness to the base of your tongue or metallic taste in your mouth, this is normal.  Use the Promethazine DM cough syrup at bedtime as needed for cough and congestion.  This will make you drowsy but will drive her nasal secretions so he can get some rest and your body can heal.  If you develop any shortness of breath, especially at rest, you feel like you cannot catch your breath, you are unable to speak in full sentences, or late sign is your lips are turning blue you need to call 911 and go to the ER.     ED Prescriptions     Medication Sig Dispense Auth. Provider   nirmatrelvir/ritonavir EUA (PAXLOVID) 20 x 150 MG & 10 x '100MG'$  TABS Take 3 tablets by mouth 2 (two) times daily for 5 days. 30 tablet Margarette Canada, NP   benzonatate (TESSALON) 100 MG capsule Take 2 capsules (200 mg total) by mouth every 8 (eight) hours. 21 capsule Margarette Canada, NP   ipratropium (ATROVENT) 0.06 % nasal spray Place 2 sprays into both nostrils 4 (four) times daily. 15 mL Margarette Canada, NP   promethazine-dextromethorphan (PROMETHAZINE-DM) 6.25-15 MG/5ML syrup Take 5 mLs by mouth 4 (four) times daily as needed. 118 mL Margarette Canada, NP      PDMP not reviewed this encounter.   Margarette Canada, NP 06/15/22 1001

## 2022-06-15 NOTE — ED Triage Notes (Signed)
Pt c/o runny nose, sore throat onset yesterday, fever last night (did not take it) coughing, sneezing.

## 2022-07-03 ENCOUNTER — Ambulatory Visit: Payer: Medicaid Other | Admitting: Surgery

## 2022-07-06 ENCOUNTER — Telehealth: Payer: Self-pay

## 2022-07-06 NOTE — Telephone Encounter (Signed)
Patient is calling because she states she is done with [facility] and Dr. Marius Ditch told her to let her know when this is finish and we would schedule a appointment with her. Please advise what you recommend. Last appointment with Dr. Hampton Abbot was 04/22/2022. She no show appointment on 06/15/2022  and 07/03/2022

## 2022-07-06 NOTE — Telephone Encounter (Signed)
I can see her next available  RV

## 2022-07-06 NOTE — Telephone Encounter (Addendum)
Made appointment 11/04/2021 and informed patient

## 2022-07-15 DIAGNOSIS — Z419 Encounter for procedure for purposes other than remedying health state, unspecified: Secondary | ICD-10-CM | POA: Diagnosis not present

## 2022-08-14 DIAGNOSIS — Z419 Encounter for procedure for purposes other than remedying health state, unspecified: Secondary | ICD-10-CM | POA: Diagnosis not present

## 2022-08-19 ENCOUNTER — Encounter: Payer: Self-pay | Admitting: Internal Medicine

## 2022-08-19 ENCOUNTER — Ambulatory Visit (INDEPENDENT_AMBULATORY_CARE_PROVIDER_SITE_OTHER): Payer: Medicaid Other | Admitting: Internal Medicine

## 2022-08-19 VITALS — BP 126/76 | HR 94 | Temp 98.3°F | Resp 16 | Ht 61.0 in | Wt 174.8 lb

## 2022-08-19 DIAGNOSIS — Z7689 Persons encountering health services in other specified circumstances: Secondary | ICD-10-CM | POA: Diagnosis not present

## 2022-08-19 DIAGNOSIS — R7989 Other specified abnormal findings of blood chemistry: Secondary | ICD-10-CM

## 2022-08-19 DIAGNOSIS — K219 Gastro-esophageal reflux disease without esophagitis: Secondary | ICD-10-CM | POA: Diagnosis not present

## 2022-08-19 DIAGNOSIS — Z23 Encounter for immunization: Secondary | ICD-10-CM

## 2022-08-19 DIAGNOSIS — I10 Essential (primary) hypertension: Secondary | ICD-10-CM

## 2022-08-19 DIAGNOSIS — E039 Hypothyroidism, unspecified: Secondary | ICD-10-CM | POA: Diagnosis not present

## 2022-08-19 MED ORDER — LEVOTHYROXINE SODIUM 75 MCG PO TABS: 75.0000 ug | ORAL_TABLET | Freq: Every day | ORAL | 1 refills | Status: DC

## 2022-08-19 MED ORDER — OMEPRAZOLE 20 MG PO CPDR: 20.0000 mg | DELAYED_RELEASE_CAPSULE | Freq: Every day | ORAL | 1 refills | Status: DC

## 2022-08-19 NOTE — Progress Notes (Signed)
Established Patient Office Visit  Subjective   Patient ID: Kristina Reed, female    DOB: [birth year]  Age: 56 y.o. MRN: [id number]  Chief Complaint  Patient presents with   Follow-up    HPI Kristina Reed is a 56 year old female here for follow up on chronic medical conditions.   Hypertension: -Medications: Nothing currently, had been on Clonidine 0.1 mg daily  -Checking BP at home (average): Not checking -Denies any SOB, CP, vision changes, LE edema or symptoms of hypotension  HLD: -Medications: Nothing -Last lipid panel: Lipid Panel     Component Value Date/Time   CHOL 213 (H) 05/20/2022 1110   TRIG 119 05/20/2022 1110   HDL 78 05/20/2022 1110   CHOLHDL 2.7 05/20/2022 1110   VLDL 26 09/02/2016 0903   LDLCALC 112 (H) 05/20/2022 1110    The 10-year ASCVD risk score (Arnett DK, et al., 2019) is: 1.3%   Values used to calculate the score:     Age: 79 years     Sex: Female     Is Non-Hispanic African American: No     Diabetic: No     Tobacco smoker: No     Systolic Blood Pressure: 063 mmHg     Is BP treated: No     HDL Cholesterol: 78 mg/dL     Total Cholesterol: 213 mg/dL   Hypothyroidism: -Medications: Levothyroxine 75 mcg -Patient is compliant with the above medication (s) at the above dose and reports no medication side effects.  -Denies weight changes, cold./heat intolerance, skin changes, anxiety/palpitations  -Last TSH: 9/23 3.87  GERD: -Currently on omeprazole 20 mg daily, needs refills.  Controls symptoms.  Elevated LFTs: -Labs from 9/23 showing AST 46, ALT 55 -CT abdomen and pelvis from 04/07/2022 showing fatty liver.- -Patient has been working on diet and losing weight.  Review of Systems  Constitutional:  Negative for chills and fever.  Eyes:  Negative for blurred vision.  Respiratory:  Negative for shortness of breath.   Cardiovascular:  Negative for chest pain.  Neurological:  Negative for headaches.      Objective:     BP 126/76   Pulse 94    Temp 98.3 F (36.8 C)   Resp 16   Ht '5\' 1"'$  (1.549 m)   Wt 174 lb 12.8 oz (79.3 kg)   LMP 05/09/1993 (Approximate)   SpO2 98%   BMI 33.03 kg/m  BP Readings from Last 3 Encounters:  08/19/22 126/76  06/15/22 114/85  05/20/22 124/78   Wt Readings from Last 3 Encounters:  08/19/22 174 lb 12.8 oz (79.3 kg)  06/15/22 160 lb (72.6 kg)  05/20/22 165 lb 3.2 oz (74.9 kg)      Physical Exam Constitutional:      Appearance: Normal appearance.  HENT:     Head: Normocephalic and atraumatic.  Eyes:     Conjunctiva/sclera: Conjunctivae normal.  Cardiovascular:     Rate and Rhythm: Normal rate and regular rhythm.  Pulmonary:     Effort: Pulmonary effort is normal.     Breath sounds: Normal breath sounds.  Abdominal:     Comments: Tenderness to palpation in the LLQ  Skin:    General: Skin is warm and dry.  Neurological:     General: No focal deficit present.     Mental Status: She is alert. Mental status is at baseline.  Psychiatric:        Mood and Affect: Mood normal.        Behavior:  Behavior normal.     No results found for any visits on 08/19/22.  Last CBC Lab Results  Component Value Date   WBC 7.6 04/08/2022   HGB 12.6 04/08/2022   HCT 38.7 04/08/2022   MCV 83.6 04/08/2022   MCH 27.2 04/08/2022   RDW 13.6 04/08/2022   PLT 302 67/08/4579   Last metabolic panel Lab Results  Component Value Date   GLUCOSE 79 05/20/2022   NA 141 05/20/2022   K 3.9 05/20/2022   CL 103 05/20/2022   CO2 25 05/20/2022   BUN 13 05/20/2022   CREATININE 0.76 05/20/2022   GFRNONAA >60 04/08/2022   CALCIUM 9.5 05/20/2022   PROT 7.1 05/20/2022   ALBUMIN 4.0 04/08/2022   LABGLOB 2.9 01/13/2016   AGRATIO 1.6 01/13/2016   BILITOT 0.3 05/20/2022   ALKPHOS 87 04/08/2022   AST 46 (H) 05/20/2022   ALT 55 (H) 05/20/2022   ANIONGAP 10 04/08/2022   Last lipids Lab Results  Component Value Date   CHOL 213 (H) 05/20/2022   HDL 78 05/20/2022   LDLCALC 112 (H) 05/20/2022   TRIG 119  05/20/2022   CHOLHDL 2.7 05/20/2022   Last hemoglobin A1c Lab Results  Component Value Date   HGBA1C 5.8 (H) 03/03/2022   Last thyroid functions Lab Results  Component Value Date   TSH 3.87 05/20/2022   Last vitamin D No results found for: "25OHVITD2", "25OHVITD3", "VD25OH" Last vitamin B12 and Folate No results found for: "VITAMINB12", "FOLATE"    The 10-year ASCVD risk score (Arnett DK, et al., 2019) is: 1.3%    Assessment & Plan:   1. Essential hypertension: Clonidine discontinued at patient's last office visit, blood pressure well controlled.  2. Adult hypothyroidism: Stable.  Continue levothyroxine 75 mcg, refilled today.  - levothyroxine (SYNTHROID) 75 MCG tablet; Take 1 tablet (75 mcg total) by mouth daily before breakfast.  Dispense: 90 tablet; Refill: 1  3. Gastroesophageal reflux disease, unspecified whether esophagitis present: Stable.  Continue Prilosec 20 mg, refilled today.  - omeprazole (PRILOSEC) 20 MG capsule; Take 1 capsule (20 mg total) by mouth daily.  Dispense: 90 capsule; Refill: 1  4. Elevated LFTs: Discussed lab results.  Patient working on weight loss and diet control.  5. Need for prophylactic vaccination with Streptococcus pneumoniae (Pneumococcus) and Influenza vaccines: Prevnar 20 administered today.  - Pneumococcal conjugate vaccine 20-valent (Prevnar 20)   Return for 3-6 months .    Kristina Medici, DO

## 2022-08-19 NOTE — Patient Instructions (Addendum)
It was great seeing you today!  Plan discussed at today's visit: -Medications refilled -Pneumonia vaccine today -Try a humidifier to help with dry air   Follow up in: 3-6 months   Take care and let us know if you have any questions or concerns prior to your next visit.  Dr. Rosana Berger

## 2022-08-24 ENCOUNTER — Telehealth: Payer: Self-pay | Admitting: Internal Medicine

## 2022-08-24 DIAGNOSIS — F331 Major depressive disorder, recurrent, moderate: Secondary | ICD-10-CM

## 2022-08-25 NOTE — Telephone Encounter (Signed)
Unable to refill per protocol, Rx expired. Medication was discontinued 04/08/22 by PCP. Will refuse.  Requested Prescriptions  Pending Prescriptions Disp Refills   FLUoxetine (PROZAC) 20 MG capsule [Pharmacy Med Name: FLUoxetine HCl 20 MG Oral Capsule] 90 capsule 0    Sig: Take 1 capsule by mouth once daily     Psychiatry:  Antidepressants - SSRI Passed - 08/25/2022 10:11 AM      Passed - Completed PHQ-2 or PHQ-9 in the last 360 days      Passed - Valid encounter within last 6 months    Recent Outpatient Visits           6 days ago Adult hypothyroidism   [facility] Teodora Medici, DO   3 months ago Essential hypertension   [place], DO   4 months ago Right shoulder pain, unspecified chronicity   [facility] Delsa Grana, PA-C   4 months ago Diverticulitis   [facility] Teodora Medici, DO   4 months ago Left lower quadrant abdominal pain   [facility] Bo Merino, FNP       Future Appointments             In 2 months Vanga, Tally Due, MD [place]

## 2022-08-26 ENCOUNTER — Encounter: Payer: Self-pay | Admitting: Surgery

## 2022-08-26 ENCOUNTER — Other Ambulatory Visit: Payer: Self-pay

## 2022-08-26 ENCOUNTER — Ambulatory Visit (INDEPENDENT_AMBULATORY_CARE_PROVIDER_SITE_OTHER): Payer: Medicaid Other | Admitting: Surgery

## 2022-08-26 DIAGNOSIS — Z7689 Persons encountering health services in other specified circumstances: Secondary | ICD-10-CM | POA: Diagnosis not present

## 2022-08-26 DIAGNOSIS — K5720 Diverticulitis of large intestine with perforation and abscess without bleeding: Secondary | ICD-10-CM | POA: Diagnosis not present

## 2022-08-26 NOTE — Patient Instructions (Addendum)
CT scheduled @ [facility] @ 11:30 on 12/19/20203.  Nothing to eat/drink 4 hours prior. Please see your follow up appointment listed below.    Diverticulitis  Diverticulitis is infection or inflammation of small pouches (diverticula) in the colon that form due to a condition called diverticulosis. Diverticula can trap stool (feces) and bacteria, causing infection and inflammation. Diverticulitis may cause severe stomach pain and diarrhea. It may lead to tissue damage in the colon that causes bleeding or blockage. The diverticula may also burst (rupture) and cause infected stool to enter other areas of the abdomen. What are the causes? This condition is caused by stool becoming trapped in the diverticula, which allows bacteria to grow in the diverticula. This leads to inflammation and infection. What increases the risk? You are more likely to develop this condition if you have diverticulosis. The risk increases if you: Are overweight or obese. Do not get enough exercise. Drink alcohol. Use tobacco products. Eat a diet that has a lot of red meat such as beef, pork, or lamb. Eat a diet that does not include enough fiber. High-fiber foods include fruits, vegetables, beans, nuts, and whole grains. Are over 73 years of age. What are the signs or symptoms? Symptoms of this condition may include: Pain and tenderness in the abdomen. The pain is normally located on the left side of the abdomen, but it may occur in other areas. Fever and chills. Nausea. Vomiting. Cramping. Bloating. Changes in bowel routines. Blood in your stool. How is this diagnosed? This condition is diagnosed based on: Your medical history. A physical exam. Tests to make sure there is nothing else causing your condition. These tests may include: Blood tests. Urine tests. CT scan of the abdomen. How is this treated? Most cases of this condition are mild and can be treated at home. Treatment may include: Taking  over-the-counter pain medicines. Following a clear liquid diet. Taking antibiotic medicines by mouth. Resting. More severe cases may need to be treated at a hospital. Treatment may include: Not eating or drinking. Taking prescription pain medicine. Receiving antibiotic medicines through an IV. Receiving fluids and nutrition through an IV. Surgery. When your condition is under control, your health care provider may recommend that you have a colonoscopy. This is an exam to look at the entire large intestine. During the exam, a lubricated, bendable tube is inserted into the anus and then passed into the rectum, colon, and other parts of the large intestine. A colonoscopy can show how severe your diverticula are and whether something else may be causing your symptoms. Follow these instructions at home: Medicines Take over-the-counter and prescription medicines only as told by your health care provider. These include fiber supplements, probiotics, and stool softeners. If you were prescribed an antibiotic medicine, take it as told by your health care provider. Do not stop taking the antibiotic even if you start to feel better. Ask your health care provider if the medicine prescribed to you requires you to avoid driving or using machinery. Eating and drinking  Follow a full liquid diet or another diet as directed by your health care provider. After your symptoms improve, your health care provider may tell you to change your diet. He or she may recommend that you eat a diet that contains at least 25 grams (25 g) of fiber daily. Fiber makes it easier to pass stool. Healthy sources of fiber include: Berries. One cup contains 4-8 grams of fiber. Beans or lentils. One-half cup contains 5-8 grams of fiber.  Green vegetables. One cup contains 4 grams of fiber. Avoid eating red meat. General instructions Do not use any products that contain nicotine or tobacco, such as cigarettes, e-cigarettes, and chewing  tobacco. If you need help quitting, ask your health care provider. Exercise for at least 30 minutes, 3 times each week. You should exercise hard enough to raise your heart rate and break a sweat. Keep all follow-up visits as told by your health care provider. This is important. You may need to have a colonoscopy. Contact a health care provider if: Your pain does not improve. Your bowel movements do not return to normal. Get help right away if: Your pain gets worse. Your symptoms do not get better with treatment. Your symptoms suddenly get worse. You have a fever. You vomit more than one time. You have stools that are bloody, black, or tarry. Summary Diverticulitis is infection or inflammation of small pouches (diverticula) in the colon that form due to a condition called diverticulosis. Diverticula can trap stool (feces) and bacteria, causing infection and inflammation. You are at higher risk for this condition if you have diverticulosis and you eat a diet that does not include enough fiber. Most cases of this condition are mild and can be treated at home. More severe cases may need to be treated at a hospital. When your condition is under control, your health care provider may recommend that you have an exam called a colonoscopy. This exam can show how severe your diverticula are and whether something else may be causing your symptoms. Keep all follow-up visits as told by your health care provider. This is important. This information is not intended to replace advice given to you by your health care provider. Make sure you discuss any questions you have with your health care provider. Document Revised: 06/12/2019 Document Reviewed: 06/12/2019 Elsevier Patient Education  [place].

## 2022-08-26 NOTE — Progress Notes (Signed)
08/26/2022  History of Present Illness: Kristina Reed is a 56 y.o. female presenting for follow up of acute diverticulitis.  She was last seen on 04/22/22 but then was lost to follow up and had some no shows.  Today she reports that she has continued to have pain in the left lower quadrant since her last visit.  She reports that the pain is constant and does not radiate.  Denies any nausea or vomiting or constipation.  Denies any blood in the stools.  Overall reports that the pain has not worsened but also has not improved.  The last antibiotic course did not help the pain per her report.  Past Medical History: Past Medical History:  Diagnosis Date   Anxiety    Arthritis    right knee and right elbow   Cancer ([place])    Cervical CA with partial hysterectomy.   Cognitive impairment, mild, so stated    Depression    Diverticulosis    Eosinophilic esophagitis 00/86/7619   Biopsy Dec 2018   Esophageal dysphagia    Family history of breast cancer 05/2021   cancer genetic testing letter sent   Gallstones    GERD (gastroesophageal reflux disease)    History of cervical cancer    Hx of cervical cancer 01/30/2016   Hypertension    Hypothyroidism    Sleep apnea    does not use a C-PAP   Status post partial hysterectomy    Due to Cervical CA   Vaginal inclusion cyst      Past Surgical History: Past Surgical History:  Procedure Laterality Date   ABDOMINAL HYSTERECTOMY     partial hysterectomy   BREAST BIOPSY Bilateral 01/01/2016   Fibroadenoma   COLONOSCOPY WITH PROPOFOL N/A 06/27/2015   Procedure: COLONOSCOPY WITH PROPOFOL;  Surgeon: Josefine Class, MD;  Location: [facility] ENDOSCOPY;  Service: Endoscopy;  Laterality: N/A;   COLONOSCOPY WITH PROPOFOL N/A 09/23/2017   Procedure: COLONOSCOPY WITH PROPOFOL;  Surgeon: Jonathon Bellows, MD;  Location: [facility] ENDOSCOPY;  Service: Gastroenterology;  Laterality: N/A;   COLONOSCOPY WITH PROPOFOL N/A 06/09/2021   Procedure: COLONOSCOPY WITH PROPOFOL;   Surgeon: Lin Landsman, MD;  Location: [facility] ENDOSCOPY;  Service: Gastroenterology;  Laterality: N/A;   ESOPHAGOGASTRODUODENOSCOPY (EGD) WITH PROPOFOL N/A 07/22/2017   Procedure: ESOPHAGOGASTRODUODENOSCOPY (EGD) WITH PROPOFOL;  Surgeon: Jonathon Bellows, MD;  Location: [facility] ENDOSCOPY;  Service: Gastroenterology;  Laterality: N/A;   ESOPHAGOGASTRODUODENOSCOPY (EGD) WITH PROPOFOL N/A 03/31/2018   Procedure: ESOPHAGOGASTRODUODENOSCOPY (EGD) WITH PROPOFOL;  Surgeon: Lin Landsman, MD;  Location: [facility] ENDOSCOPY;  Service: Gastroenterology;  Laterality: N/A;   ESOPHAGOGASTRODUODENOSCOPY (EGD) WITH PROPOFOL N/A 08/25/2021   Procedure: ESOPHAGOGASTRODUODENOSCOPY (EGD) WITH PROPOFOL;  Surgeon: Lin Landsman, MD;  Location: [facility] ENDOSCOPY;  Service: Gastroenterology;  Laterality: N/A;   KNEE ARTHROSCOPY Right 07/22/2016   Procedure: ARTHROSCOPY KNEE debridement microfracture;  Surgeon: Leanor Kail, MD;  Location: [facility] ORS;  Service: Orthopedics;  Laterality: Right;   KNEE ARTHROSCOPY WITH MEDIAL MENISECTOMY Left 11/10/2017   Procedure: KNEE ARTHROSCOPY WITH PARTIAL MEDIAL MENISECTOMY&patella femoral debridement, & ablation;  Surgeon: Leanor Kail, MD;  Location: [facility] ORS;  Service: Orthopedics;  Laterality: Left;   TOTAL KNEE ARTHROPLASTY Right 11/24/2021   Procedure: TOTAL KNEE ARTHROPLASTY;  Surgeon: Lovell Sheehan, MD;  Location: [facility] ORS;  Service: Orthopedics;  Laterality: Right;   TUBAL LIGATION      Home Medications: Prior to Admission medications   Medication Sig Start Date End Date Taking? Authorizing Provider  ipratropium (ATROVENT) 0.06 % nasal spray  Place 2 sprays into both nostrils 4 (four) times daily. 06/15/22  Yes Margarette Canada, NP  levothyroxine (SYNTHROID) 75 MCG tablet Take 1 tablet (75 mcg total) by mouth daily before breakfast. 08/19/22  Yes Teodora Medici, DO  lidocaine (LIDODERM) 5 % APPLY 1 PATCH BY TOPICAL ROUTE ONCE DAILY (MAY WEAR UP TO 12HOURS.) 04/22/21  Yes  [provider]  meloxicam (MOBIC) 15 MG tablet Take 15 mg by mouth daily. 02/04/22  Yes [provider]  omeprazole (PRILOSEC) 20 MG capsule Take 1 capsule (20 mg total) by mouth daily. 08/19/22  Yes Teodora Medici, DO    Allergies: Allergies  Allergen Reactions   Prednisone Nausea And Vomiting   Methylprednisolone Palpitations    Increased heart rate   Percocet [Oxycodone-Acetaminophen] Palpitations    Review of Systems: Review of Systems  Constitutional:  Negative for chills and fever.  Respiratory:  Negative for shortness of breath.   Cardiovascular:  Negative for chest pain.  Gastrointestinal:  Positive for abdominal pain. Negative for constipation, diarrhea, nausea and vomiting.    Physical Exam BP (!) 147/92   Pulse 80   Temp 97.9 F (36.6 C) (Oral)   Ht '5\' 1"'$  (1.549 m)   Wt 172 lb (78 kg)   LMP 05/09/1993 (Approximate)   SpO2 98%   BMI 32.50 kg/m  CONSTITUTIONAL: No acute distress HEENT:  Normocephalic, atraumatic, extraocular motion intact. RESPIRATORY:  Lungs are clear, and breath sounds are equal bilaterally. Normal respiratory effort without pathologic use of accessory muscles. CARDIOVASCULAR: Heart is regular without murmurs, gallops, or rubs. GI: The abdomen is soft, non-distended with localized tenderness in the left lower quadrant.  No peritonitis or guarding.  NEUROLOGIC:  Motor and sensation is grossly normal.  Cranial nerves are grossly intact. PSYCH:  Alert and oriented to person, place and time. Affect is normal.  Labs/Imaging: CT abdomen/pelvis on 04/07/22 IMPRESSION: Changes of active diverticulitis in the proximal sigmoid colon with micro perforation. No frank free air noted elsewhere in the abdomen or pelvis.   Fatty liver.  Assessment and Plan: This is a 56 y.o. female with acute diverticulitis with microperforation.  --Discussed with the patient that it'd be prudent to obtain repeat CT scan of abdomen/pelvis to further  evaluate.  She could be having a low grade chronic diverticulitis or could have developed stenosis from her prior episode.  Although she describes a 10/10 pain, her exam is not that significant and she does not appear to be peritoneal.  Will get CT scan first and then she will follow up in our office to determine further plans. She may need a prolonged antibiotic course, but for now would await results before ordering anything.  She understands that I will be out of town next week, but she will follow up with Mr. Olean Ree instead.  I spent 30 minutes dedicated to the care of this patient on the date of this encounter to include pre-visit review of records, face-to-face time with the patient discussing diagnosis and management, and any post-visit coordination of care.   Melvyn Neth, [facility]

## 2022-08-27 ENCOUNTER — Telehealth: Payer: Self-pay | Admitting: Internal Medicine

## 2022-08-27 DIAGNOSIS — F331 Major depressive disorder, recurrent, moderate: Secondary | ICD-10-CM

## 2022-08-27 NOTE — Telephone Encounter (Signed)
Refused because the Prozac 20 mg was discontinued 04/08/2022.

## 2022-09-01 ENCOUNTER — Ambulatory Visit
Admission: RE | Admit: 2022-09-01 | Discharge: 2022-09-01 | Disposition: A | Discharge status: Home / Self Care | Payer: Medicaid Other | Source: Ambulatory Visit | Attending: Surgery | Admitting: Surgery

## 2022-09-01 DIAGNOSIS — R109 Unspecified abdominal pain: Secondary | ICD-10-CM | POA: Diagnosis not present

## 2022-09-01 DIAGNOSIS — K5720 Diverticulitis of large intestine with perforation and abscess without bleeding: Secondary | ICD-10-CM | POA: Insufficient documentation

## 2022-09-01 DIAGNOSIS — Z7689 Persons encountering health services in other specified circumstances: Secondary | ICD-10-CM | POA: Diagnosis not present

## 2022-09-01 DIAGNOSIS — K760 Fatty (change of) liver, not elsewhere classified: Secondary | ICD-10-CM | POA: Diagnosis not present

## 2022-09-01 LAB — POCT I-STAT CREATININE: Creatinine, Ser: 0.8 mg/dL (ref 0.44–1.00)

## 2022-09-01 MED ORDER — IOHEXOL 300 MG/ML  SOLN
100.0000 mL | Freq: Once | INTRAMUSCULAR | Status: AC | PRN
  Administered 2022-09-01: 100 mL via INTRAVENOUS

## 2022-09-03 ENCOUNTER — Telehealth: Payer: Self-pay

## 2022-09-03 ENCOUNTER — Ambulatory Visit: Payer: Medicaid Other | Admitting: Physician Assistant

## 2022-09-03 NOTE — Telephone Encounter (Signed)
Per Dr.Piscoya- CT no diverticulitis-no follow up needed- she sees Gastroenterologist already.  Patient's appointment cancelled for today.

## 2022-09-14 DIAGNOSIS — Z419 Encounter for procedure for purposes other than remedying health state, unspecified: Secondary | ICD-10-CM | POA: Diagnosis not present

## 2022-09-24 ENCOUNTER — Ambulatory Visit: Payer: Self-pay

## 2022-09-24 ENCOUNTER — Ambulatory Visit: Payer: Self-pay | Admitting: *Deleted

## 2022-09-24 NOTE — Telephone Encounter (Signed)
Reason for Disposition  [1] MODERATE difficulty breathing (e.g., speaks in phrases, SOB even at rest, pulse 100-120) AND [2] NEW-onset or WORSE than normal  Answer Assessment - Initial Assessment Questions 1. RESPIRATORY STATUS: "Describe your breathing?" (e.g., wheezing, shortness of breath, unable to speak, severe coughing)      Pt sounds short of breath.   Speaking in phrases.    2. ONSET: "When did this breathing problem begin?"      Started Tues. And then again last night.   It feels like someone is cutting off my windpipe.    3. PATTERN "Does the difficult breathing come and go, or has it been constant since it started?"      One time Tues. And then last night it got bad.   Had to sleep sitting up.   No history of lung or heart.   4. SEVERITY: "How bad is your breathing?" (e.g., mild, moderate, severe)    - MILD: No SOB at rest, mild SOB with walking, speaks normally in sentences, can lie down, no retractions, pulse < 100.    - MODERATE: SOB at rest, SOB with minimal exertion and prefers to sit, cannot lie down flat, speaks in phrases, mild retractions, audible wheezing, pulse 100-120.    - SEVERE: Very SOB at rest, speaks in single words, struggling to breathe, sitting hunched forward, retractions, pulse > 120      Severe 5. RECURRENT SYMPTOM: "Have you had difficulty breathing before?" If Yes, ask: "When was the last time?" and "What happened that time?"      She put Jeneen Rinks, boyfriend on the line after I let her know she needed to go to the ED.    He was willing to take her to the ED but he needed to wait until his daughter brought his car back in a few minutes.   I encouraged him to call 911.   He is on oxygen so said he will put his oxygen on her until they get there to help her out.   He was agreeable to calling 911 for her and thanked me for my help. 6. CARDIAC HISTORY: "Do you have any history of heart disease?" (e.g., heart attack, angina, bypass surgery, angioplasty)      No 7. LUNG  HISTORY: "Do you have any history of lung disease?"  (e.g., pulmonary embolus, asthma, emphysema)     No 8. CAUSE: "What do you think is causing the breathing problem?"      I don't know 9. OTHER SYMPTOMS: "Do you have any other symptoms? (e.g., dizziness, runny nose, cough, chest pain, fever)     Not asked as at this point I have referred to the ED. 10. O2 SATURATION MONITOR:  "Do you use an oxygen saturation monitor (pulse oximeter) at home?" If Yes, ask: "What is your reading (oxygen level) today?" "What is your usual oxygen saturation reading?" (e.g., 95%)       Not asked 11. PREGNANCY: "Is there any chance you are pregnant?" "When was your last menstrual period?"       N/A due to age 57. TRAVEL: "Have you traveled out of the country in the last month?" (e.g., travel history, exposures)       Not asked  Protocols used: Breathing Difficulty-A-AH

## 2022-09-24 NOTE — Telephone Encounter (Signed)
Pt called, unable to LVM d/t VM not set up. Pt has been triaged previously for same reason but not showing in ED at this time. Will try call back.   Summary: medical ?   Patient called in with s/o on the phone. She is using his oxygen machine and he wants to know what level he should put it on for her if that's possible.

## 2022-09-24 NOTE — Telephone Encounter (Signed)
    Chief Complaint: Pt. States she is "feeling a little better. I'm not going to ED. I have an appointment tomorrow." Symptoms: SOB Frequency: Today Pertinent Negatives: Patient denies  Disposition: '[x]'$ ED /'[]'$ Urgent Care (no appt availability in office) / '[]'$ Appointment(In office/virtual)/ '[]'$  [place] [facility]/ '[]'$ Home Care/ '[]'$ Refused Recommended Disposition /'[]'$ [place] [facility]/ '[]'$  Follow-up with PCP Additional Notes: States she is using SO O2 and "and it helped." Instructed she should not use oxygen without a doctor's order and recommendation. Instructed to go to ED for worsening of symptoms. Reason for Disposition  Patient sounds very sick or weak to the triager  Answer Assessment - Initial Assessment Questions 1. RESPIRATORY STATUS: "Describe your breathing?" (e.g., wheezing, shortness of breath, unable to speak, severe coughing)      SOB 2. ONSET: "When did this breathing problem begin?"      Today 3. PATTERN "Does the difficult breathing come and go, or has it been constant since it started?"      Constant 4. SEVERITY: "How bad is your breathing?" (e.g., mild, moderate, severe)    - MILD: No SOB at rest, mild SOB with walking, speaks normally in sentences, can lie down, no retractions, pulse < 100.    - MODERATE: SOB at rest, SOB with minimal exertion and prefers to sit, cannot lie down flat, speaks in phrases, mild retractions, audible wheezing, pulse 100-120.    - SEVERE: Very SOB at rest, speaks in single words, struggling to breathe, sitting hunched forward, retractions, pulse > 120      Moderate 5. RECURRENT SYMPTOM: "Have you had difficulty breathing before?" If Yes, ask: "When was the last time?" and "What happened that time?"      No 6. CARDIAC HISTORY: "Do you have any history of heart disease?" (e.g., heart attack, angina, bypass surgery, angioplasty)      No 7. LUNG HISTORY: "Do you have any history of lung disease?"  (e.g., pulmonary embolus, asthma,  emphysema)     No 8. CAUSE: "What do you think is causing the breathing problem?"      Respiratory infection 9. OTHER SYMPTOMS: "Do you have any other symptoms? (e.g., dizziness, runny nose, cough, chest pain, fever)     Cough 10. O2 SATURATION MONITOR:  "Do you use an oxygen saturation monitor (pulse oximeter) at home?" If Yes, ask: "What is your reading (oxygen level) today?" "What is your usual oxygen saturation reading?" (e.g., 95%)       No 11. PREGNANCY: "Is there any chance you are pregnant?" "When was your last menstrual period?"       No 12. TRAVEL: "Have you traveled out of the country in the last month?" (e.g., travel history, exposures)       No  Protocols used: Breathing Difficulty-A-AH

## 2022-09-24 NOTE — Progress Notes (Signed)
Acute Office Visit  Subjective:     Patient ID: Kristina Reed, female    DOB: 1965-12-28, 57 y.o.   MRN: [id number]  Chief Complaint  Patient presents with   Cough   Shortness of Breath    Onset for 4-5 days   Foreign Body in Skin    Possible splinter on left glute happened yesterday slipped on sat wrong on a wood bench    HPI Patient is in today for shortness of breath and cough. No history of asthma/COPD, does not smoke. No hormones, no long drives/plan rides, or decrease in activity.   URI Compliant: Symptoms started about 5 days ago, started with shortness of breath at rest and with activity and dry cough. Now having wheezing as well.  -Fever: no -Cough: yes, dry  -Shortness of breath: yes -Wheezing: yes -Nasal congestion: no -Runny nose: yes -Sore throat: yes -Sinus pressure: no -Face pain: no -Ear pain: no  -Ear pressure: no  -Treatments attempted: none  -Chest pain: no -Palpitations: no  -Nausea: no -Diaphoresis: no -Status: fluctuating  She is also complaining of a splinter on her left gluteal muscle. States a few days ago she was leaning forward to get a mop and fell on the deck. She though there was a splinter but her and boyfriend tried to get it out, now the entire area is red, inflamed and painful. No fevers or drainage from the wound.   Review of Systems  Constitutional:  Negative for chills and fever.  HENT:  Positive for sore throat. Negative for congestion, ear pain and sinus pain.   Respiratory:  Positive for cough, shortness of breath and wheezing. Negative for sputum production.   Cardiovascular:  Negative for chest pain, palpitations and leg swelling.  Gastrointestinal:  Negative for abdominal pain.        Objective:    BP (!) 136/90   Pulse 83   Temp 98.6 F (37 C) (Oral)   Resp 18   Ht '5\' 1"'$  (1.549 m)   Wt 177 lb (80.3 kg)   LMP 05/09/1993 (Approximate)   SpO2 98%   BMI 33.44 kg/m  BP Readings from Last 3 Encounters:  09/25/22  (!) 136/90  08/26/22 (!) 147/92  08/19/22 126/76   Wt Readings from Last 3 Encounters:  09/25/22 177 lb (80.3 kg)  08/26/22 172 lb (78 kg)  08/19/22 174 lb 12.8 oz (79.3 kg)      Physical Exam Constitutional:      Appearance: She is well-developed.  HENT:     Head: Normocephalic and atraumatic.  Eyes:     Conjunctiva/sclera: Conjunctivae normal.  Cardiovascular:     Rate and Rhythm: Normal rate and regular rhythm.  Pulmonary:     Effort: Pulmonary effort is normal.     Breath sounds: Wheezing present. No rhonchi or rales.     Comments: Inspiratory wheezes throughout  Skin:    General: Skin is warm and dry.     Findings: Erythema present.     Comments: Large abscess on left gluteal region, not open without drainage. No splinter or foreign body seen but with inflammation and crepitus.   Neurological:     General: No focal deficit present.     Mental Status: She is alert. Mental status is at baseline.  Psychiatric:        Mood and Affect: Mood normal.        Behavior: Behavior normal.     No results found for any visits on  09/25/22.      Assessment & Plan:   1. SOB (shortness of breath)/Cough, unspecified type: Flu negative today, COVID test pending. Wheezing on exam, chest x-ray to rule out pneumonia but more concerned about small airway disease. Sample of Breztri given. No risk factors for PE and no symptoms other than SOB and cough but discussed with patient that if her symptoms worsen over the weekend despite inhaler she will need to go to the ER for work up. No oral steroids given due to history of allergy. Follow up in 1 month.   - POCT Influenza A/B - Novel Coronavirus, NAA ([facility]) - DG Chest 2 View; Future  2. Abscess: No drainage able to be expressed, will treat with Doxycycline 100 mg BID x 7 days. Discussed using warm compresses as well, no foreign body seen.   - doxycycline (VIBRA-TABS) 100 MG tablet; Take 1 tablet (100 mg total) by mouth 2 (two) times  daily for 7 days.  Dispense: 14 tablet; Refill: 0   Return in about 4 weeks (around 10/23/2022).  Teodora Medici, DO

## 2022-09-24 NOTE — Telephone Encounter (Signed)
  Chief Complaint: Difficulty breathing Symptoms: Talking in phrases   Referred to the ED Frequency: Last night got worse and this morning bad Pertinent Negatives: Patient denies history of lung or heart problems. Disposition: '[x]'$ ED /'[]'$ Urgent Care (no appt availability in office) / '[]'$ Appointment(In office/virtual)/ '[]'$  [place] [facility]/ '[]'$ Home Care/ '[]'$ Refused Recommended Disposition /'[]'$ [place] [facility]/ '[]'$  Follow-up with PCP Additional Notes: Instructed her boyfriend, Jeneen Rinks to call 911 which he was agreeable to doing since they don't have transportation to get her to the hospital.

## 2022-09-25 ENCOUNTER — Encounter: Payer: Self-pay | Admitting: Internal Medicine

## 2022-09-25 ENCOUNTER — Ambulatory Visit (INDEPENDENT_AMBULATORY_CARE_PROVIDER_SITE_OTHER): Payer: Medicaid Other | Admitting: Internal Medicine

## 2022-09-25 ENCOUNTER — Ambulatory Visit
Admission: RE | Admit: 2022-09-25 | Discharge: 2022-09-25 | Disposition: A | Discharge status: Home / Self Care | Payer: Medicaid Other | Attending: Internal Medicine | Admitting: Internal Medicine

## 2022-09-25 ENCOUNTER — Ambulatory Visit
Admission: RE | Admit: 2022-09-25 | Discharge: 2022-09-25 | Disposition: A | Discharge status: Home / Self Care | Payer: Medicaid Other | Source: Ambulatory Visit | Attending: Internal Medicine | Admitting: Internal Medicine

## 2022-09-25 ENCOUNTER — Ambulatory Visit: Payer: Self-pay | Admitting: *Deleted

## 2022-09-25 ENCOUNTER — Ambulatory Visit: Payer: Self-pay

## 2022-09-25 VITALS — BP 136/90 | HR 83 | Temp 98.6°F | Resp 18 | Ht 61.0 in | Wt 177.0 lb

## 2022-09-25 DIAGNOSIS — R0602 Shortness of breath: Secondary | ICD-10-CM

## 2022-09-25 DIAGNOSIS — R059 Cough, unspecified: Secondary | ICD-10-CM

## 2022-09-25 DIAGNOSIS — L0291 Cutaneous abscess, unspecified: Secondary | ICD-10-CM | POA: Diagnosis not present

## 2022-09-25 LAB — POCT INFLUENZA A/B
Influenza A, POC: NEGATIVE
Influenza B, POC: NEGATIVE

## 2022-09-25 MED ORDER — DOXYCYCLINE HYCLATE 100 MG PO TABS: 100.0000 mg | ORAL_TABLET | Freq: Two times a day (BID) | ORAL | 0 refills | Status: AC

## 2022-09-25 NOTE — Patient Instructions (Addendum)
It was great seeing you today!  Plan discussed at today's visit: -Flu and COVID tested today -Chest x-ray today -Start antibiotic Doxycycline 100 mg twice a day for 7 days, this will help with skin abscess and infection in lungs if present -Sample of inhaler given use 2 sprays once a day while feeling short of breath. If symptoms worsen over the weekend despite inhaler, you need to go to the ER -Use warm compresses for abscess to help it drain  Follow up in: 1 month  Take care and let us know if you have any questions or concerns prior to your next visit.  Dr. Rosana Berger

## 2022-09-25 NOTE — Telephone Encounter (Signed)
Reason for Disposition  [1] MILD difficulty breathing (e.g., minimal/no SOB at rest, SOB with walking, pulse <100) AND [2] NEW-onset or WORSE than normal    Has an appt today at 12:00.   She finally agreed to keep the appt.   Called in to cancel.  Answer Assessment - Initial Assessment Questions 1. RESPIRATORY STATUS: "Describe your breathing?" (e.g., wheezing, shortness of breath, unable to speak, severe coughing)      Pt. Is short of breath and using her boyfriend's oxygen.   Wanting to cancel her appt. For today at 12 noon.  Boyfriend got on line and asked if she could just use his oxygen.   That's what he did yesterday is put her on his oxygen.    I let him know she needs to be evaluated and by a provider and that he needs to continue using his own oxygen.   I let him know I didn't want her to end up in the ED.   (I spoke with them yesterday about this same issue).   He was agreeable to bringing her in for the appt. Today at 12:00.    (I could hear her coughing in the background).   Boyfriend mentioned that he had been sick with the same stuff.   I let him know that she still needed to be evaluated due to her shortness of breath.   She was talking with me in phrases. I asked her why she didn't want to come in and she said "I have a cold and didn't want to spread it plus we only have one car and my daughter needs it to run her children around plus gas is an issue".   She did say,  "I will come in today if you feel I really need to".    I let her know she really needed to keep this appt today because I'm afraid she's going to end up in the ED and the weekend is coming up.     She and her boyfriend finally agreed to keep the appt. For today.   2. ONSET: "When did this breathing problem begin?"      See triage notes from yesterday. 3. PATTERN "Does the difficult breathing come and go, or has it been constant since it started?"       4. SEVERITY: "How bad is your breathing?" (e.g., mild, moderate, severe)     - MILD: No SOB at rest, mild SOB with walking, speaks normally in sentences, can lie down, no retractions, pulse < 100.    - MODERATE: SOB at rest, SOB with minimal exertion and prefers to sit, cannot lie down flat, speaks in phrases, mild retractions, audible wheezing, pulse 100-120.    - SEVERE: Very SOB at rest, speaks in single words, struggling to breathe, sitting hunched forward, retractions, pulse > 120       5. RECURRENT SYMPTOM: "Have you had difficulty breathing before?" If Yes, ask: "When was the last time?" and "What happened that time?"       6. CARDIAC HISTORY: "Do you have any history of heart disease?" (e.g., heart attack, angina, bypass surgery, angioplasty)       7. LUNG HISTORY: "Do you have any history of lung disease?"  (e.g., pulmonary embolus, asthma, emphysema)      8. CAUSE: "What do you think is causing the breathing problem?"      I have a cold and I'm coughing and all. 9. OTHER SYMPTOMS: "Do you have any  other symptoms? (e.g., dizziness, runny nose, cough, chest pain, fever)      10. O2 SATURATION MONITOR:  "Do you use an oxygen saturation monitor (pulse oximeter) at home?" If Yes, ask: "What is your reading (oxygen level) today?" "What is your usual oxygen saturation reading?" (e.g., 95%)        11. PREGNANCY: "Is there any chance you are pregnant?" "When was your last menstrual period?"        12. TRAVEL: "Have you traveled out of the country in the last month?" (e.g., travel history, exposures)       Yes    Boyfriend was sick with a cold and now she has it.  Protocols used: Breathing Difficulty-A-AH

## 2022-09-25 NOTE — Telephone Encounter (Signed)
  Chief Complaint: Medication question. Symptoms: Difficulty breathing Frequency:  Pertinent Negatives: Patient denies  Disposition: '[]'$ ED /'[]'$ Urgent Care (no appt availability in office) / '[]'$ Appointment(In office/virtual)/ '[]'$  [place] [facility]/ '[]'$ Home Care/ '[]'$ Refused Recommended Disposition /'[]'$ [place] [facility]/ '[x]'$  Follow-up with PCP Additional Notes: PT would like to know if she can and/or should use her boyfriend's oxygen.  Please advise.   Summary: Med Management.   Pt stated the pharmacy advised her that the medication prescribed today will not be ready until tomorrow after 4. Stated her boyfriend has oxygen in the house is asking if she should use his oxygen because she cannot get her medication until tomorrow .   Pt seeking clinical advice.     Reason for Disposition  [1] Caller has URGENT medicine question about med that PCP or specialist prescribed AND [2] triager unable to answer question  Answer Assessment - Initial Assessment Questions 1. NAME of MEDICINE: "What medicine(s) are you calling about?"     Oxygen 2. QUESTION: "What is your question?" (e.g., double dose of medicine, side effect)     Can pt use boyfriend's O2 3. PRESCRIBER: "Who prescribed the medicine?" Reason: if prescribed by specialist, call should be referred to that group.      4. SYMPTOMS: "Do you have any symptoms?" If Yes, ask: "What symptoms are you having?"  "How bad are the symptoms (e.g., mild, moderate, severe)      5. PREGNANCY:  "Is there any chance that you are pregnant?" "When was your last menstrual period?"  Protocols used: Medication Question Call-A-AH

## 2022-09-25 NOTE — Telephone Encounter (Signed)
  Chief Complaint: Short of breath and coughing a lot.   She called in to cancel her appt. Today at 12:00 because "She has a cold and doesn't want to spread it".    Symptoms: Talking in phrases and coughing a lot.  (Boyfriend wanting to treat her with his oxygen).    I let her know to wear a mask and that she needed to keep this appt. Today due to her shortness of breath.   She finally agreed to come in for the appt today. Frequency: For several days. Pertinent Negatives: Patient denies N/A Disposition: '[]'$ ED /'[]'$ Urgent Care (no appt availability in office) / '[x]'$ Appointment(In office/virtual)/ '[]'$  [place] [facility]/ '[]'$ Home Care/ '[]'$ Refused Recommended Disposition /'[]'$ [place] [facility]/ '[]'$  Follow-up with PCP Additional Notes: Called to cancel today's appt.    I finally got her to agree to come on in today at 12:00 and to wear a mask.   I let her know due to her shortness of breath she was probably going to end up going to the ED over the weekend.

## 2022-09-26 LAB — NOVEL CORONAVIRUS, NAA: SARS-CoV-2, NAA: NOT DETECTED

## 2022-09-28 NOTE — Telephone Encounter (Signed)
Called and lvm

## 2022-09-29 ENCOUNTER — Telehealth (INDEPENDENT_AMBULATORY_CARE_PROVIDER_SITE_OTHER): Payer: Medicaid Other | Admitting: Internal Medicine

## 2022-09-29 ENCOUNTER — Ambulatory Visit: Payer: Self-pay

## 2022-09-29 ENCOUNTER — Encounter: Payer: Self-pay | Admitting: Internal Medicine

## 2022-09-29 DIAGNOSIS — L0291 Cutaneous abscess, unspecified: Secondary | ICD-10-CM

## 2022-09-29 DIAGNOSIS — R0602 Shortness of breath: Secondary | ICD-10-CM

## 2022-09-29 MED ORDER — METHYLPREDNISOLONE 4 MG PO TBPK: ORAL_TABLET | ORAL | 0 refills | Status: DC

## 2022-09-29 NOTE — Telephone Encounter (Signed)
     Chief Complaint: SOB, no better from last week. No transportation to come into office. Symptoms: Above Frequency: Seen last week in office Pertinent Negatives: Patient denies fever Disposition: '[]'$ ED /'[]'$ Urgent Care (no appt availability in office) / '[x]'$ Appointment(In office/virtual)/ '[]'$  [place] [facility]/ '[]'$ Home Care/ '[]'$ Refused Recommended Disposition /'[]'$ [place] [facility]/ '[]'$  Follow-up with PCP Additional Notes:   Reason for Disposition  [1] MILD difficulty breathing (e.g., minimal/no SOB at rest, SOB with walking, pulse <100) AND [2] NEW-onset or WORSE than normal  Answer Assessment - Initial Assessment Questions 1. RESPIRATORY STATUS: "Describe your breathing?" (e.g., wheezing, shortness of breath, unable to speak, severe coughing)      SOB 2. ONSET: "When did this breathing problem begin?"      Last week 3. PATTERN "Does the difficult breathing come and go, or has it been constant since it started?"      Constant 4. SEVERITY: "How bad is your breathing?" (e.g., mild, moderate, severe)    - MILD: No SOB at rest, mild SOB with walking, speaks normally in sentences, can lie down, no retractions, pulse < 100.    - MODERATE: SOB at rest, SOB with minimal exertion and prefers to sit, cannot lie down flat, speaks in phrases, mild retractions, audible wheezing, pulse 100-120.    - SEVERE: Very SOB at rest, speaks in single words, struggling to breathe, sitting hunched forward, retractions, pulse > 120      Moderate 5. RECURRENT SYMPTOM: "Have you had difficulty breathing before?" If Yes, ask: "When was the last time?" and "What happened that time?"      No 6. CARDIAC HISTORY: "Do you have any history of heart disease?" (e.g., heart attack, angina, bypass surgery, angioplasty)      No 7. LUNG HISTORY: "Do you have any history of lung disease?"  (e.g., pulmonary embolus, asthma, emphysema)     No 8. CAUSE: "What do you think is causing the breathing problem?"      Unsure 9.  OTHER SYMPTOMS: "Do you have any other symptoms? (e.g., dizziness, runny nose, cough, chest pain, fever)     Runny nose 10. O2 SATURATION MONITOR:  "Do you use an oxygen saturation monitor (pulse oximeter) at home?" If Yes, ask: "What is your reading (oxygen level) today?" "What is your usual oxygen saturation reading?" (e.g., 95%)       No 11. PREGNANCY: "Is there any chance you are pregnant?" "When was your last menstrual period?"       No 12. TRAVEL: "Have you traveled out of the country in the last month?" (e.g., travel history, exposures)       No  Protocols used: Breathing Difficulty-A-AH

## 2022-09-29 NOTE — Progress Notes (Signed)
Virtual Visit via Telephone Note  I connected with Kristina Reed on 09/29/22 at 12:20 PM EST by telephone and verified that I am speaking with the correct person using two identifiers.  Location: Patient: Home Provider: [facility]   I discussed the limitations, risks, security and privacy concerns of performing an evaluation and management service by telephone and the availability of in person appointments. I also discussed with the patient that there may be a patient responsible charge related to this service. The patient expressed understanding and agreed to proceed.   History of Present Illness:  Patient is following up over the phone for continued shortness of breath.  She was seen here in the office 4 days ago complaining of shortness of breath at rest and activity. She was given a Breztri inhaler which she has been consistent with and chest x-ray was negative for pneumonia. She was given Doxycycline for abscess, which she has several days left of as well. Flu and COVID testing negative. She denies cough, wheezing or fevers today. No risk factors for PE, shortness of breath is the same as it was at our last visit. She denies chest pain,palpitations or leg swelling.   She also states abscess is not any better and causing pain despite taking antibiotics.     Observations/Objective:  General: well sounding, no acute distress Neuro: answers all questions appropriately   Assessment and Plan:  1. Shortness of breath: The same as it was 4 days ago, not improved with Breztri and Doxycycline, chest x-ray negative. She will come into the office for labs. She has a documented allergy to Medrol but allergy is tachycardia which is a side effect of the medication. She is willing to take steroinds, prescription sent. Again discussed that if shortness of breath worsens, she needs to go to the ER for acute imaging.   - B Nat Peptide - COMPLETE METABOLIC PANEL WITH GFR - CBC w/Diff/Platelet - TSH -  methylPREDNISolone (MEDROL DOSEPAK) 4 MG TBPK tablet; Day 1: Take 8 mg (2 tablets) before breakfast, 4 mg (1 tablet) after lunch, 4 mg (1 tablet) after supper, and 8 mg (2 tablets) at bedtime. Day 2:Take 4 mg (1 tablet) before breakfast, 4 mg (1 tablet) after lunch, 4 mg (1 tablet) after supper, and 8 mg (2 tablets) at bedtime. Day 3: Take 4 mg (1 tablet) before breakfast, 4 mg (1 tablet) after lunch, 4 mg (1 tablet) after supper, and 4 mg (1 tablet) at bedtime. Day 4: Take 4 mg (1 tablet) before breakfast, 4 mg (1 tablet) after lunch, and 4 mg (1 tablet) at bedtime. Day 5: Take 4 mg (1 tablet) before breakfast and 4 mg (1 tablet) at bedtime. Day 6: Take 4 mg (1 tablet) before breakfast.  Dispense: 1 each; Refill: 0  2. Abscess: Patient requesting referral for I&D, order placed but will continue warm compressed and finish antibiotics.  - Ambulatory referral to General Surgery  Follow Up Instructions: already scheduled in 1 month    I discussed the assessment and treatment plan with the patient. The patient was provided an opportunity to ask questions and all were answered. The patient agreed with the plan and demonstrated an understanding of the instructions.   The patient was advised to call back or seek an in-person evaluation if the symptoms worsen or if the condition fails to improve as anticipated.  I provided 13 minutes of non-face-to-face time during this encounter.   Teodora Medici, DO

## 2022-10-02 DIAGNOSIS — Z7689 Persons encountering health services in other specified circumstances: Secondary | ICD-10-CM | POA: Diagnosis not present

## 2022-10-02 DIAGNOSIS — Z96651 Presence of right artificial knee joint: Secondary | ICD-10-CM | POA: Diagnosis not present

## 2022-10-02 DIAGNOSIS — M1712 Unilateral primary osteoarthritis, left knee: Secondary | ICD-10-CM | POA: Diagnosis not present

## 2022-10-05 ENCOUNTER — Ambulatory Visit (INDEPENDENT_AMBULATORY_CARE_PROVIDER_SITE_OTHER): Payer: Medicaid Other | Admitting: Surgery

## 2022-10-05 ENCOUNTER — Encounter: Payer: Self-pay | Admitting: Surgery

## 2022-10-05 ENCOUNTER — Other Ambulatory Visit: Payer: Self-pay | Admitting: Internal Medicine

## 2022-10-05 ENCOUNTER — Telehealth: Payer: Self-pay

## 2022-10-05 VITALS — BP 145/89 | HR 89 | Temp 98.2°F | Ht 61.0 in | Wt 174.0 lb

## 2022-10-05 DIAGNOSIS — F331 Major depressive disorder, recurrent, moderate: Secondary | ICD-10-CM

## 2022-10-05 DIAGNOSIS — M795 Residual foreign body in soft tissue: Secondary | ICD-10-CM

## 2022-10-05 DIAGNOSIS — W458XXA Other foreign body or object entering through skin, initial encounter: Secondary | ICD-10-CM | POA: Diagnosis not present

## 2022-10-05 DIAGNOSIS — R0602 Shortness of breath: Secondary | ICD-10-CM | POA: Diagnosis not present

## 2022-10-05 DIAGNOSIS — Z7689 Persons encountering health services in other specified circumstances: Secondary | ICD-10-CM | POA: Diagnosis not present

## 2022-10-05 MED ORDER — FLUOXETINE HCL 20 MG PO TABS: 20.0000 mg | ORAL_TABLET | Freq: Every day | ORAL | 1 refills | Status: DC

## 2022-10-05 NOTE — Telephone Encounter (Signed)
Pt wants refill on fluoxetine '20mg'$  it is not on current med list but is on historical

## 2022-10-05 NOTE — Progress Notes (Signed)
10/05/2022  History of Present Illness: Kristina Reed is a 57 y.o. female presenting for evaluation of a possible left gluteal abscess.  The patient reports that she sat/fell onto a deck bench a few weeks ago and thinks she got a splinter from the wood that punctured her skin.  It is not visible from the outside but reports that the area has been getting more swollen and tender with time.  Denies any drainage at the moment.  She had a course of doxycycline that started on 09/25/22 but the patient reports it did not help.  Past Medical History: Past Medical History:  Diagnosis Date   Anxiety    Arthritis    right knee and right elbow   Cancer ([place])    Cervical CA with partial hysterectomy.   Cognitive impairment, mild, so stated    Depression    Diverticulosis    Eosinophilic esophagitis 70/35/0093   Biopsy Dec 2018   Esophageal dysphagia    Family history of breast cancer 05/2021   cancer genetic testing letter sent   Gallstones    GERD (gastroesophageal reflux disease)    History of cervical cancer    Hx of cervical cancer 01/30/2016   Hypertension    Hypothyroidism    Sleep apnea    does not use a C-PAP   Status post partial hysterectomy    Due to Cervical CA   Vaginal inclusion cyst      Past Surgical History: Past Surgical History:  Procedure Laterality Date   ABDOMINAL HYSTERECTOMY     partial hysterectomy   BREAST BIOPSY Bilateral 01/01/2016   Fibroadenoma   COLONOSCOPY WITH PROPOFOL N/A 06/27/2015   Procedure: COLONOSCOPY WITH PROPOFOL;  Surgeon: Josefine Class, MD;  Location[facility] ENDOSCOPY;  Service: Endoscopy;  Laterality: N/A;   COLONOSCOPY WITH PROPOFOL N/A 09/23/2017   Procedure: COLONOSCOPY WITH PROPOFOL;  Surgeon: Jonathon Bellows, MD;  Location: [facility] ENDOSCOPY;  Service: Gastroenterology;  Laterality: N/A;   COLONOSCOPY WITH PROPOFOL N/A 06/09/2021   Procedure: COLONOSCOPY WITH PROPOFOL;  Surgeon: Lin Landsman, MD;  Location: [facility] ENDOSCOPY;  Service:  Gastroenterology;  Laterality: N/A;   ESOPHAGOGASTRODUODENOSCOPY (EGD) WITH PROPOFOL N/A 07/22/2017   Procedure: ESOPHAGOGASTRODUODENOSCOPY (EGD) WITH PROPOFOL;  Surgeon: Jonathon Bellows, MD;  Location: [facility] ENDOSCOPY;  Service: Gastroenterology;  Laterality: N/A;   ESOPHAGOGASTRODUODENOSCOPY (EGD) WITH PROPOFOL N/A 03/31/2018   Procedure: ESOPHAGOGASTRODUODENOSCOPY (EGD) WITH PROPOFOL;  Surgeon: Lin Landsman, MD;  Location: [facility] ENDOSCOPY;  Service: Gastroenterology;  Laterality: N/A;   ESOPHAGOGASTRODUODENOSCOPY (EGD) WITH PROPOFOL N/A 08/25/2021   Procedure: ESOPHAGOGASTRODUODENOSCOPY (EGD) WITH PROPOFOL;  Surgeon: Lin Landsman, MD;  Location: [facility] ENDOSCOPY;  Service: Gastroenterology;  Laterality: N/A;   KNEE ARTHROSCOPY Right 07/22/2016   Procedure: ARTHROSCOPY KNEE debridement microfracture;  Surgeon: Leanor Kail, MD;  Location: [facility] ORS;  Service: Orthopedics;  Laterality: Right;   KNEE ARTHROSCOPY WITH MEDIAL MENISECTOMY Left 11/10/2017   Procedure: KNEE ARTHROSCOPY WITH PARTIAL MEDIAL MENISECTOMY&patella femoral debridement, & ablation;  Surgeon: Leanor Kail, MD;  Location: [facility] ORS;  Service: Orthopedics;  Laterality: Left;   TOTAL KNEE ARTHROPLASTY Right 11/24/2021   Procedure: TOTAL KNEE ARTHROPLASTY;  Surgeon: Lovell Sheehan, MD;  Location: [facility] ORS;  Service: Orthopedics;  Laterality: Right;   TUBAL LIGATION      Home Medications: Prior to Admission medications   Medication Sig Start Date End Date Taking? Authorizing Provider  ipratropium (ATROVENT) 0.06 % nasal spray Place 2 sprays into both nostrils 4 (four) times daily. 06/15/22  Yes Margarette Canada,  NP  levothyroxine (SYNTHROID) 75 MCG tablet Take 1 tablet (75 mcg total) by mouth daily before breakfast. 08/19/22  Yes Teodora Medici, DO  lidocaine (LIDODERM) 5 % APPLY 1 PATCH BY TOPICAL ROUTE ONCE DAILY (MAY WEAR UP TO 12HOURS.) 04/22/21  Yes [provider]  meloxicam (MOBIC) 15 MG tablet Take 15 mg by  mouth daily. 02/04/22  Yes [provider]  methylPREDNISolone (MEDROL DOSEPAK) 4 MG TBPK tablet Day 1: Take 8 mg (2 tablets) before breakfast, 4 mg (1 tablet) after lunch, 4 mg (1 tablet) after supper, and 8 mg (2 tablets) at bedtime. Day 2:Take 4 mg (1 tablet) before breakfast, 4 mg (1 tablet) after lunch, 4 mg (1 tablet) after supper, and 8 mg (2 tablets) at bedtime. Day 3: Take 4 mg (1 tablet) before breakfast, 4 mg (1 tablet) after lunch, 4 mg (1 tablet) after supper, and 4 mg (1 tablet) at bedtime. Day 4: Take 4 mg (1 tablet) before breakfast, 4 mg (1 tablet) after lunch, and 4 mg (1 tablet) at bedtime. Day 5: Take 4 mg (1 tablet) before breakfast and 4 mg (1 tablet) at bedtime. Day 6: Take 4 mg (1 tablet) before breakfast. 09/29/22  Yes Teodora Medici, DO  omeprazole (PRILOSEC) 20 MG capsule Take 1 capsule (20 mg total) by mouth daily. 08/19/22  Yes Teodora Medici, DO  FLUoxetine (PROZAC) 20 MG tablet Take 1 tablet (20 mg total) by mouth daily. 10/05/22   Teodora Medici, DO    Allergies: Allergies  Allergen Reactions   Prednisone Nausea And Vomiting   Methylprednisolone Palpitations    Increased heart rate   Percocet [Oxycodone-Acetaminophen] Palpitations    Review of Systems: Review of Systems  Constitutional:  Negative for chills and fever.  Respiratory:  Negative for shortness of breath.   Cardiovascular:  Negative for chest pain.  Gastrointestinal:  Negative for abdominal pain, nausea and vomiting.  Skin:        Left gluteal area of tenderness/swelling.    Physical Exam BP (!) 145/89   Pulse 89   Temp 98.2 F (36.8 C) (Oral)   Ht '5\' 1"'$  (1.549 m)   Wt 174 lb (78.9 kg)   LMP 05/09/1993 (Approximate)   SpO2 99%   BMI 32.88 kg/m  CONSTITUTIONAL: No acute distress, well nourished. HEENT:  Normocephalic, atraumatic, extraocular motion intact. RESPIRATORY:  Normal respiratory effort without pathologic use of accessory muscles. CARDIOVASCULAR: Regular rhythm  and rate. SKIN:  Left lateral gluteal area has a small blister/boil with no active drainage, and medial to it is a linear firm area consistent with induration.  No significant erythema noted.  Upon manual pressure, a thin, 3 cm wooden splinter was able to be pushed out through the blister head.  Minimal amount of purulent fluid was expressed.  A bandaid was placed for dressing. NEUROLOGIC:  Motor and sensation is grossly normal.  Cranial nerves are grossly intact. PSYCH:  Alert and oriented to person, place and time. Affect is normal.   Assessment and Plan: This is a 57 y.o. female with small abscess from retained foreign body.  --The splinter was manually removed and small amount of purulent fluid expressed.  No significant erythema noted.  No further antibiotics needed at this point.  Recommended that she change BandAid once daily and as needed to keep the are clean/dry until it's healed. --Follow up as needed.  I spent 20 minutes dedicated to the care of this patient on the date of this encounter to include pre-visit review  of records, face-to-face time with the patient discussing diagnosis and management, and any post-visit coordination of care.   Melvyn Neth, [facility]

## 2022-10-05 NOTE — Patient Instructions (Signed)
If you have any concerns or questions, please feel free to call our office.   Sliver Removal, Care After A sliver, also called a splinter, is a small, thin piece of an object that gets stuck (embedded) under your skin. Slivers can create a deep wound that can easily become infected. After removing a sliver from the skin, it is important to care for the wound. A sliver can be made of any material, such as: Wood. Glass. Plastic. Metal. Plant material, such as a thorn. Slivers often break into smaller pieces when they enter the skin. If pieces of the sliver broke off and stayed in your skin, the area may feel tender. In time you may see them working themselves out. This is normal. The following information offers guidance on how to care for yourself after your procedure. Your health care provider may also give you more specific instructions. If you have problems or questions, contact your health care provider. What can I expect after the procedure? After a sliver has been removed, it is common to have some pain at the wound site. This is especially common if pieces of the sliver remained in your skin. These pieces usually come out on their own. Follow these instructions at home: Wound care  Follow instructions from your health care provider about how to take care of your wound. Make sure you: Wash your hands with soap and water for at least 20 seconds before and after you change your bandage (dressing). If soap and water are not available, use hand sanitizer. Change your dressing as told by your health care provider. Check your wound every day for signs of infection. Check for: Redness, swelling, or pain. Fluid or blood. Pus or a bad smell. New warmth, a rash, or hardness at the wound site. Do not take baths, swim, or use a hot tub until your health care provider approves. General instructions Take over-the-counter and prescription medicines only as told by your health care provider. If you  were prescribed an antibiotic medicine, take or apply it as told by your health care provider. Do not stop using the antibiotic even if you start to feel better. If directed, put ice on the painful area. To do this: Put ice in a plastic bag. Place a towel between your skin and the bag. Leave the ice on for 20 minutes, 2-3 times a day. Remove the ice if your skin turns bright red. This is very important. If you cannot feel pain, heat, or cold, you have a greater risk of damage to the area. Keep all follow-up visits. This is important. Contact a health care provider if: You think that a piece of the sliver is still in your skin, or you notice something coming out of the wound. You have signs of infection, including: New or worsening redness, swelling, or pain around the wound. New warmth, a rash, or hardness at the wound site. Fluid, blood, or pus coming from the wound. A bad smell coming from the wound or dressing. You received a tetanus shot and you have swelling, severe pain, redness, or bleeding at the injection site. Get help right away if you have: An unexplained fever or chills. Red streaks coming from the wound. Summary A sliver, also called a splinter, is a small, thin piece of an object that gets stuck (embedded) under your skin. After removing a sliver from the skin, it is important to care for the wound. Be sure to contact your health care provider if fluid, blood,  or pus is coming from the wound. This information is not intended to replace advice given to you by your health care provider. Make sure you discuss any questions you have with your health care provider. Document Revised: 12/24/2020 Document Reviewed: 12/24/2020 Elsevier Patient Education  [place].

## 2022-10-06 LAB — COMPLETE METABOLIC PANEL WITH GFR
AG Ratio: 1.4 (calc) (ref 1.0–2.5)
ALT: 25 U/L (ref 6–29)
AST: 19 U/L (ref 10–35)
Albumin: 4.2 g/dL (ref 3.6–5.1)
Alkaline phosphatase (APISO): 87 U/L (ref 37–153)
BUN: 9 mg/dL (ref 7–25)
CO2: 28 mmol/L (ref 20–32)
Calcium: 9.3 mg/dL (ref 8.6–10.4)
Chloride: 102 mmol/L (ref 98–110)
Creat: 0.71 mg/dL (ref 0.50–1.03)
Globulin: 3 g/dL (calc) (ref 1.9–3.7)
Glucose, Bld: 81 mg/dL (ref 65–99)
Potassium: 3.9 mmol/L (ref 3.5–5.3)
Sodium: 139 mmol/L (ref 135–146)
Total Bilirubin: 0.3 mg/dL (ref 0.2–1.2)
Total Protein: 7.2 g/dL (ref 6.1–8.1)
eGFR: 101 mL/min/{1.73_m2} (ref >=60)

## 2022-10-06 LAB — CBC WITH DIFFERENTIAL/PLATELET
Absolute Monocytes: 455 cells/uL (ref 200–950)
Basophils Absolute: 63 cells/uL (ref 0–200)
Basophils Relative: 0.9 %
Eosinophils Absolute: 455 cells/uL (ref 15–500)
Eosinophils Relative: 6.5 %
HCT: 40.2 % (ref 35.0–45.0)
Hemoglobin: 13.5 g/dL (ref 11.7–15.5)
Lymphs Abs: 2569 cells/uL (ref 850–3900)
MCH: 28.3 pg (ref 27.0–33.0)
MCHC: 33.6 g/dL (ref 32.0–36.0)
MCV: 84.3 fL (ref 80.0–100.0)
MPV: 10.5 fL (ref 7.5–12.5)
Monocytes Relative: 6.5 %
Neutro Abs: 3458 cells/uL (ref 1500–7800)
Neutrophils Relative %: 49.4 %
Platelets: 254 10*3/uL (ref 140–400)
RBC: 4.77 10*6/uL (ref 3.80–5.10)
RDW: 12.3 % (ref 11.0–15.0)
Total Lymphocyte: 36.7 %
WBC: 7 10*3/uL (ref 3.8–10.8)

## 2022-10-06 LAB — TSH: TSH: 4.48 mIU/L

## 2022-10-06 LAB — BRAIN NATRIURETIC PEPTIDE: Brain Natriuretic Peptide: 34 pg/mL (ref <100)

## 2022-10-06 NOTE — Telephone Encounter (Signed)
Pharmacy comment: NDC not covered by insurance. [facility] covers capsules only.

## 2022-10-15 DIAGNOSIS — Z419 Encounter for procedure for purposes other than remedying health state, unspecified: Secondary | ICD-10-CM | POA: Diagnosis not present

## 2022-10-27 NOTE — Progress Notes (Unsigned)
   Acute Office Visit  Subjective:     Patient ID: Kristina Reed, female    DOB: 02-Jul-1966, 57 y.o.   MRN: [id number]  No chief complaint on file.   HPI Patient is in today for follow up on shortness of breath and cough. No history of asthma/COPD, does not smoke.Was treated initially with Breztri and Doxycyline which did not improve her symptoms. Subsequent chest x-ray was negative. She was then seen over telemedicine and treated with oral steroids. Labs were obtained which showed a normal BNP, normal white count and thyroid function. Today she states that    Review of Systems  Constitutional:  Negative for chills and fever.  HENT:  Positive for sore throat. Negative for congestion, ear pain and sinus pain.   Respiratory:  Positive for cough, shortness of breath and wheezing. Negative for sputum production.   Cardiovascular:  Negative for chest pain, palpitations and leg swelling.  Gastrointestinal:  Negative for abdominal pain.        Objective:    LMP 05/09/1993 (Approximate)  BP Readings from Last 3 Encounters:  10/05/22 (!) 145/89  09/25/22 (!) 136/90  08/26/22 (!) 147/92   Wt Readings from Last 3 Encounters:  10/05/22 174 lb (78.9 kg)  09/25/22 177 lb (80.3 kg)  08/26/22 172 lb (78 kg)      Physical Exam Constitutional:      Appearance: She is well-developed.  HENT:     Head: Normocephalic and atraumatic.  Eyes:     Conjunctiva/sclera: Conjunctivae normal.  Cardiovascular:     Rate and Rhythm: Normal rate and regular rhythm.  Pulmonary:     Effort: Pulmonary effort is normal.     Breath sounds: Wheezing present. No rhonchi or rales.     Comments: Inspiratory wheezes throughout  Skin:    General: Skin is warm and dry.     Findings: Erythema present.     Comments: Large abscess on left gluteal region, not open without drainage. No splinter or foreign body seen but with inflammation and crepitus.   Neurological:     General: No focal deficit present.      Mental Status: She is alert. Mental status is at baseline.  Psychiatric:        Mood and Affect: Mood normal.        Behavior: Behavior normal.     No results found for any visits on 10/28/22.      Assessment & Plan:   1. SOB (shortness of breath)/Cough, unspecified type: Flu negative today, COVID test pending. Wheezing on exam, chest x-ray to rule out pneumonia but more concerned about small airway disease. Sample of Breztri given. No risk factors for PE and no symptoms other than SOB and cough but discussed with patient that if her symptoms worsen over the weekend despite inhaler she will need to go to the ER for work up. No oral steroids given due to history of allergy. Follow up in 1 month.   - POCT Influenza A/B - Novel Coronavirus, NAA ([facility]) - DG Chest 2 View; Future  2. Abscess: No drainage able to be expressed, will treat with Doxycycline 100 mg BID x 7 days. Discussed using warm compresses as well, no foreign body seen.   - doxycycline (VIBRA-TABS) 100 MG tablet; Take 1 tablet (100 mg total) by mouth 2 (two) times daily for 7 days.  Dispense: 14 tablet; Refill: 0   No follow-ups on file.  Teodora Medici, DO

## 2022-10-28 ENCOUNTER — Encounter: Payer: Self-pay | Admitting: Internal Medicine

## 2022-10-28 ENCOUNTER — Ambulatory Visit (INDEPENDENT_AMBULATORY_CARE_PROVIDER_SITE_OTHER): Payer: Medicaid Other | Admitting: Internal Medicine

## 2022-10-28 VITALS — BP 140/84 | HR 84 | Temp 98.1°F | Resp 16 | Ht 61.0 in | Wt 174.1 lb

## 2022-10-28 DIAGNOSIS — R413 Other amnesia: Secondary | ICD-10-CM | POA: Diagnosis not present

## 2022-10-28 DIAGNOSIS — R0602 Shortness of breath: Secondary | ICD-10-CM

## 2022-10-28 DIAGNOSIS — Z7689 Persons encountering health services in other specified circumstances: Secondary | ICD-10-CM | POA: Diagnosis not present

## 2022-10-28 NOTE — Patient Instructions (Addendum)
It was great seeing you today!  Plan discussed at today's visit: -Referrals to Cardiology and Neurology placed today, please call back here if you don't hear anything about scheduling these appointments in the next 2 weeks   Follow up in: 6 months   Take care and let us know if you have any questions or concerns prior to your next visit.  Dr. Rosana Berger

## 2022-10-29 ENCOUNTER — Ambulatory Visit: Payer: Medicaid Other | Admitting: Gastroenterology

## 2022-10-30 DIAGNOSIS — M1712 Unilateral primary osteoarthritis, left knee: Secondary | ICD-10-CM | POA: Diagnosis not present

## 2022-10-30 DIAGNOSIS — Z96651 Presence of right artificial knee joint: Secondary | ICD-10-CM | POA: Diagnosis not present

## 2022-10-30 DIAGNOSIS — M7631 Iliotibial band syndrome, right leg: Secondary | ICD-10-CM | POA: Diagnosis not present

## 2022-10-30 DIAGNOSIS — Z7689 Persons encountering health services in other specified circumstances: Secondary | ICD-10-CM | POA: Diagnosis not present

## 2022-11-04 ENCOUNTER — Ambulatory Visit: Payer: Medicaid Other | Admitting: Gastroenterology

## 2022-11-04 ENCOUNTER — Telehealth: Payer: Self-pay | Admitting: Internal Medicine

## 2022-11-04 NOTE — Telephone Encounter (Signed)
Spoke with pt and informed her that she would need an appt. She verbalized understanding and scheduled for 2.27.2024

## 2022-11-04 NOTE — Telephone Encounter (Signed)
Copied from [place] [id number]. Topic: Appointment Scheduling - Scheduling Inquiry for Clinic >> Nov 04, 2022  8:50 AM Marcellus Scott wrote: Reason for CRM: Pt is requesting to schedule a lab only appointment to check her kidneys and liver.  No orders.  Please advise.

## 2022-11-10 ENCOUNTER — Ambulatory Visit: Payer: Medicaid Other | Admitting: Internal Medicine

## 2022-11-11 ENCOUNTER — Other Ambulatory Visit: Payer: Self-pay

## 2022-11-12 ENCOUNTER — Telehealth: Payer: Self-pay

## 2022-11-12 NOTE — Telephone Encounter (Signed)
The patient is scheduled for 12/22/22 with ABC

## 2022-11-12 NOTE — Telephone Encounter (Signed)
Pt calling triage requesting refill on her patches. Pt aware she needs to be seen. Transferred to front desk to schedule annual.

## 2022-11-13 DIAGNOSIS — Z419 Encounter for procedure for purposes other than remedying health state, unspecified: Secondary | ICD-10-CM | POA: Diagnosis not present

## 2022-11-16 ENCOUNTER — Other Ambulatory Visit: Payer: Self-pay

## 2022-11-16 ENCOUNTER — Encounter: Payer: Self-pay | Admitting: Gastroenterology

## 2022-11-16 ENCOUNTER — Ambulatory Visit (INDEPENDENT_AMBULATORY_CARE_PROVIDER_SITE_OTHER): Payer: Medicaid Other | Admitting: Gastroenterology

## 2022-11-16 VITALS — BP 149/98 | HR 75 | Temp 98.3°F | Ht 61.0 in | Wt 172.1 lb

## 2022-11-16 DIAGNOSIS — K2210 Ulcer of esophagus without bleeding: Secondary | ICD-10-CM

## 2022-11-16 DIAGNOSIS — R1319 Other dysphagia: Secondary | ICD-10-CM

## 2022-11-16 NOTE — Progress Notes (Signed)
Cephas Darby, MD [address]  [place]  [place], [place] [address]  Main: [phone]  Fax: [phone] Pager: [phone]   Primary Care Physician: Teodora Medici, DO  Primary Gastroenterologist:  Dr. Sherri Sear  Chief Complaint  Patient presents with   esophagitis    Gastroesophageal Reflux    HPI: Kristina Reed is a 57 y.o. female history of erosive esophagitis, ?  Eosinophilic esophagitis is seen in for follow-up of dysphagia.  Patient does not have proper dentition and also does not reviewed and shows when she eats.  She reports that she can chew well with her gums and avoids eating hard meats.  She reports difficulty swallowing 3-4 times a week with solid foods.  Denies any food impaction, regurgitation of food.  She is currently taking omeprazole 20 mg daily  She underwent upper endoscopy on 08/25/2021 which revealed mild erosive esophagitis and biopsies confirmed reflux esophagitis with eosinophils 15 per HPF in distal esophagus only.  I started her on omeprazole 40 mg p.o. twice daily which significantly improved her symptoms.   Patient had a history of acute diverticulitis in the proximal sigmoid colon with microperforation and no evidence of free air in 03/2022, closely managed by general surgery with outpatient antibiotics.  Patient is no longer experiencing left lower quadrant pain.  Reports having regular bowel movements.  She does not have any other GI concerns today   Current Outpatient Medications:    FLUoxetine (PROZAC) 20 MG capsule, TAKE 1 CAPSULE BY MOUTH ONCE DAILY, Disp: 90 capsule, Rfl: 1   levothyroxine (SYNTHROID) 75 MCG tablet, Take 1 tablet (75 mcg total) by mouth daily before breakfast., Disp: 90 tablet, Rfl: 1   meloxicam (MOBIC) 15 MG tablet, Take 15 mg by mouth daily., Disp: , Rfl:    omeprazole (PRILOSEC) 20 MG capsule, Take 1 capsule (20 mg total) by mouth daily., Disp: 90 capsule, Rfl: 1   Allergies as of 11/16/2022 - Review  Complete 11/16/2022  Allergen Reaction Noted   Prednisone Nausea And Vomiting 07/23/2021   Methylprednisolone Palpitations 05/12/2021   Percocet [oxycodone-acetaminophen] Palpitations 01/20/2015    NSAIDs: none  Antiplts/Anticoagulants/Anti thrombotics: none  GI procedures:  Upper endoscopy 08/25/2021 - Erythematous duodenopathy. Biopsied. - Erosive gastropathy with stigmata of recent bleeding. Biopsied. - Normal gastric body and incisura. Biopsied. - LA Grade A reflux esophagitis with bleeding. - Esophagogastric landmarks identified. - Normal upper third of esophagus and middle third of esophagus. Biopsied. DIAGNOSIS:  A. DUODENUM; COLD BIOPSY:  - DUODENAL MUCOSA WITH FOCAL FEATURES SUGGESTIVE OF PEPTIC DUODENITIS.  - NEGATIVE FOR FEATURES OF CELIAC DISEASE.  - NEGATIVE FOR DYSPLASIA AND MALIGNANCY.   B. STOMACH; COLD BIOPSY:  - CHRONIC GASTRITIS.  - NEGATIVE FOR ACTIVE INFLAMMATION AND H PYLORI.  - NEGATIVE FOR INTESTINAL METAPLASIA, DYSPLASIA, AND MALIGNANCY.   C. ESOPHAGUS, DISTAL; COLD BIOPSY:  - SQUAMOUS MUCOSA WITH FOCALLY INCREASED EOSINOPHILS (APPROXIMATELY 15  IN A SINGLE HIGH POWER FIELD).  - SEE COMMENT.   D. ESOPHAGUS, PROXIMAL; COLD BIOPSY:  - SQUAMOUS MUCOSA WITH MILD FEATURES SUGGESTIVE OF REFLUX.  - NEGATIVE FOR INCREASED EOSINOPHILS.  - NEGATIVE FOR INTESTINAL METAPLASIA, DYSPLASIA, AND MALIGNANCY.  - SEE COMMENT.   Comment:  The findings are most consistent with reflux esophagitis; however  eosinophilic esophagitis cannot be entirely excluded.  Clinical  correlation is recommended.   Upper endoscopy 07/22/2017 - Normal examined duodenum. - Gastritis. Biopsied. - Salmon-colored mucosa suspicious for Barrett's esophagus. Biopsied. - Normal mucosa was found  in the entire esophagus. Biopsied. DIAGNOSIS:  A. STOMACH; COLD BIOPSY:  - ANTRAL MUCOSA WITH CHANGES CONSISTENT WITH HEALING MUCOSAL INJURY.  - NEGATIVE FOR H. PYLORI, DYSPLASIA, AND  MALIGNANCY.   B. ESOPHAGUS, DISTAL; COLD BIOPSY:  - GASTROESOPHAGITIS.  - UP TO 40 INTRAEPITHELIAL EOSINOPHILS IN A HIGH-POWER FIELD WITH  EOSINOPHILIC MICROABSCESS FORMATION.  - PANCREATIC ACINAR CELL METAPLASIA.  - NEGATIVE FOR INTESTINAL METAPLASIA, DYSPLASIA, AND MALIGNANCY.   C. ESOPHAGUS; RANDOM BIOPSY:  - SQUAMOUS MUCOSA WITH AN INCREASE IN INTRAEPITHELIAL EOSINOPHILS  INVOLVING ALL BIOPSY FRAGMENTS, SEE COMMENT.  - UP TO 55 EOSINOPHILS IN A HIGH-POWER FIELD WITH EOSINOPHILIC  MICROABSCESS FORMATION.  - NEGATIVE FOR DYSPLASIA AND MALIGNANCY.   Colonoscopy on 09/2017 by Dr. Vicente Males - Non-bleeding internal hemorrhoids. - Diverticulosis in the sigmoid colon. - The examination was otherwise normal on direct and retroflexion views. - No specimens collected.  EGD 03/31/2018 - Normal duodenal bulb and second portion of the duodenum. - Normal stomach. - Esophagogastric landmarks identified. - Normal esophagus. Biopsied.  DIAGNOSIS:  A.  DISTAL ESOPHAGUS; COLD BIOPSY:  - SQUAMOUS MUCOSA WITH UP TO 10 IN A HIGH-POWER FIELD INTRAEPITHELIAL  EOSINOPHILS.  - NEGATIVE FOR DYSPLASIA AND MALIGNANCY.   B.  PROXIMAL ESOPHAGUS; COLD BIOPSY:  - SQUAMOUS MUCOSA NEGATIVE FOR EOSINOPHILS, DYSPLASIA AND MALIGNANCY.   ROS:  General: Negative for anorexia, weight loss, fever, chills, fatigue, weakness. ENT: Negative for hoarseness, difficulty swallowing , nasal congestion. CV: Negative for chest pain, angina, palpitations, dyspnea on exertion, peripheral edema.  Respiratory: Negative for dyspnea at rest, dyspnea on exertion, cough, sputum, wheezing.  GI: See history of present illness. GU:  Negative for dysuria, hematuria, urinary incontinence, urinary frequency, nocturnal urination.  Endo: Negative for unusual weight change.    Physical Examination:   BP (!) 149/98 (BP Location: Left Arm, Patient Position: Sitting, Cuff Size: Normal)   Pulse 75   Temp 98.3 F (36.8 C) (Oral)   Ht '5\' 1"'$   (1.549 m)   Wt 172 lb 2 oz (78.1 kg)   LMP 05/09/1993 (Approximate)   BMI 32.52 kg/m   General: Well-nourished, well-developed in no acute distress.  Eyes: No icterus. Conjunctivae pink. Mouth: Oropharyngeal mucosa moist and pink , no lesions erythema or exudate. Lungs: Clear to auscultation bilaterally. Non-labored. Heart: Regular rate and rhythm, no murmurs rubs or gallops.  Abdomen: Bowel sounds are normal, nontender, nondistended, no hepatosplenomegaly or masses, no hernia , no rebound or guarding.   Rectal exam: Nontender, normal perianal exam, hypopigmented perianal skin Extremities: No lower extremity edema. No clubbing or deformities. Neuro: Alert and oriented x 3.  Grossly intact. Skin: Warm and dry, no jaundice.   Psych: Alert and cooperative, normal mood and affect.   Imaging Studies: No results found.  Assessment and Plan:   CHASELYN MARONE is a 57 y.o. female history of eosinophilic esophagitis treated with Flovent in the past, chronic constipation and intermittent rectal bleeding here for follow-up. Prior history of banding with modest benefit. She now has recurrence of dysphagia  Dysphagia to solids: with prior history of eosinophilic Esophagitis EGD in 08/2021, off PPI revealed mild erosive esophagitis and 15 eosinophils on high-power field in distal esophagus only.  There was no eosinophils in the proximal esophagus.  This goes against a diagnosis of eosinophilic esophagitis.  She was previously treated with optimal dose Prilosec 40 mg p.o. twice daily, decreased to 20 mg once daily after symptoms resolved.  Currently, with recurrence of dysphagia to solids Increase Prilosec to 40 mg  daily before breakfast and recommend repeat EGD with proximal and distal esophageal biopsies   Follow up as needed   Dr Sherri Sear, MD

## 2022-11-20 ENCOUNTER — Ambulatory Visit: Payer: Medicaid Other | Admitting: Certified Registered Nurse Anesthetist

## 2022-11-20 ENCOUNTER — Ambulatory Visit
Admission: RE | Admit: 2022-11-20 | Discharge: 2022-11-20 | Disposition: A | Discharge status: Home / Self Care | Payer: Medicaid Other | Attending: Gastroenterology | Admitting: Gastroenterology

## 2022-11-20 ENCOUNTER — Encounter
Admission: RE | Disposition: A | Discharge status: Home / Self Care | Payer: Self-pay | Source: Home / Self Care | Attending: Gastroenterology

## 2022-11-20 ENCOUNTER — Other Ambulatory Visit: Payer: Self-pay

## 2022-11-20 ENCOUNTER — Encounter: Payer: Self-pay | Admitting: Gastroenterology

## 2022-11-20 ENCOUNTER — Telehealth: Payer: Self-pay | Admitting: Internal Medicine

## 2022-11-20 DIAGNOSIS — E039 Hypothyroidism, unspecified: Secondary | ICD-10-CM | POA: Diagnosis not present

## 2022-11-20 DIAGNOSIS — R1319 Other dysphagia: Secondary | ICD-10-CM

## 2022-11-20 DIAGNOSIS — K3189 Other diseases of stomach and duodenum: Secondary | ICD-10-CM | POA: Insufficient documentation

## 2022-11-20 DIAGNOSIS — G4730 Sleep apnea, unspecified: Secondary | ICD-10-CM | POA: Diagnosis not present

## 2022-11-20 DIAGNOSIS — E669 Obesity, unspecified: Secondary | ICD-10-CM | POA: Diagnosis not present

## 2022-11-20 DIAGNOSIS — I10 Essential (primary) hypertension: Secondary | ICD-10-CM | POA: Insufficient documentation

## 2022-11-20 DIAGNOSIS — F32A Depression, unspecified: Secondary | ICD-10-CM | POA: Insufficient documentation

## 2022-11-20 DIAGNOSIS — K2960 Other gastritis without bleeding: Secondary | ICD-10-CM

## 2022-11-20 DIAGNOSIS — Z08 Encounter for follow-up examination after completed treatment for malignant neoplasm: Secondary | ICD-10-CM | POA: Diagnosis not present

## 2022-11-20 DIAGNOSIS — Z6832 Body mass index (BMI) 32.0-32.9, adult: Secondary | ICD-10-CM | POA: Diagnosis not present

## 2022-11-20 DIAGNOSIS — Z8541 Personal history of malignant neoplasm of cervix uteri: Secondary | ICD-10-CM | POA: Diagnosis not present

## 2022-11-20 DIAGNOSIS — R1314 Dysphagia, pharyngoesophageal phase: Secondary | ICD-10-CM | POA: Insufficient documentation

## 2022-11-20 DIAGNOSIS — K200 Eosinophilic esophagitis: Secondary | ICD-10-CM | POA: Insufficient documentation

## 2022-11-20 DIAGNOSIS — K219 Gastro-esophageal reflux disease without esophagitis: Secondary | ICD-10-CM | POA: Insufficient documentation

## 2022-11-20 DIAGNOSIS — K259 Gastric ulcer, unspecified as acute or chronic, without hemorrhage or perforation: Secondary | ICD-10-CM | POA: Diagnosis not present

## 2022-11-20 DIAGNOSIS — F419 Anxiety disorder, unspecified: Secondary | ICD-10-CM | POA: Diagnosis not present

## 2022-11-20 HISTORY — PX: ESOPHAGOGASTRODUODENOSCOPY (EGD) WITH PROPOFOL: SHX5813

## 2022-11-20 SURGERY — ESOPHAGOGASTRODUODENOSCOPY (EGD) WITH PROPOFOL
Anesthesia: General

## 2022-11-20 MED ORDER — GLYCOPYRROLATE 0.2 MG/ML IJ SOLN
INTRAMUSCULAR | Status: DC | PRN
  Administered 2022-11-20: .2 mg via INTRAVENOUS

## 2022-11-20 MED ORDER — PROPOFOL 10 MG/ML IV BOLUS
INTRAVENOUS | Status: DC | PRN
  Administered 2022-11-20: 20 mg via INTRAVENOUS
  Administered 2022-11-20: 70 mg via INTRAVENOUS

## 2022-11-20 MED ORDER — LIDOCAINE HCL (CARDIAC) PF 100 MG/5ML IV SOSY
PREFILLED_SYRINGE | INTRAVENOUS | Status: DC | PRN
  Administered 2022-11-20: 100 mg via INTRAVENOUS

## 2022-11-20 MED ORDER — PROPOFOL 500 MG/50ML IV EMUL
INTRAVENOUS | Status: DC | PRN
  Administered 2022-11-20: 150 ug/kg/min via INTRAVENOUS

## 2022-11-20 MED ORDER — SODIUM CHLORIDE 0.9 % IV SOLN: INTRAVENOUS | Status: DC

## 2022-11-20 NOTE — Anesthesia Preprocedure Evaluation (Signed)
Anesthesia Evaluation  Patient identified by MRN, date of birth, ID band Patient awake    Reviewed: Allergy & Precautions, NPO status , Patient's Chart, lab work & pertinent test results  History of Anesthesia Complications Negative for: history of anesthetic complications  Airway Mallampati: I   Neck ROM: Full    Dental  (+) Edentulous Upper, Edentulous Lower   Pulmonary sleep apnea , neg COPD, Patient abstained from smoking.Not current smoker   Pulmonary exam normal breath sounds clear to auscultation       Cardiovascular Exercise Tolerance: Good METShypertension, (-) CAD and (-) Past MI Normal cardiovascular exam(-) dysrhythmias  Rhythm:Regular Rate:Normal  ECG 10/17/21: normal   Neuro/Psych  Headaches PSYCHIATRIC DISORDERS Anxiety Depression       GI/Hepatic PUD,GERD  Medicated and Controlled,,(+)     (-) substance abuse    Endo/Other  neg diabetesHypothyroidism  Obesity   Renal/GU negative Renal ROS     Musculoskeletal  (+) Arthritis ,    Abdominal   Peds  Hematology negative hematology ROS (+)   Anesthesia Other Findings Past Medical History: No date: Anxiety No date: Arthritis     Comment:  right knee and right elbow No date: Cancer [facility])     Comment:  Cervical CA with partial hysterectomy. No date: Cognitive impairment, mild, so stated No date: Depression No date: Diverticulosis [id number]: Eosinophilic esophagitis     Comment:  Biopsy Dec 2018 No date: Esophageal dysphagia 05/2021: Family history of breast cancer     Comment:  cancer genetic testing letter sent No date: Gallstones No date: GERD (gastroesophageal reflux disease) No date: History of cervical cancer 01/30/2016: Hx of cervical cancer No date: Hypertension No date: Hypothyroidism No date: Sleep apnea     Comment:  does not use a C-PAP No date: Status post partial hysterectomy     Comment:  Due to Cervical CA No date: Vaginal  inclusion cyst  Reproductive/Obstetrics Cervical CA                              Anesthesia Physical Anesthesia Plan  ASA: 2  Anesthesia Plan: General   Post-op Pain Management: Minimal or no pain anticipated   Induction: Intravenous  PONV Risk Score and Plan: 2 and Propofol infusion, TIVA and Ondansetron  Airway Management Planned: Nasal Cannula  Additional Equipment: None  Intra-op Plan:   Post-operative Plan:   Informed Consent: I have reviewed the patients History and Physical, chart, labs and discussed the procedure including the risks, benefits and alternatives for the proposed anesthesia with the patient or authorized representative who has indicated his/her understanding and acceptance.     Dental advisory given  Plan Discussed with: CRNA and Surgeon  Anesthesia Plan Comments: (Discussed risks of anesthesia with patient, including possibility of difficulty with spontaneous ventilation under anesthesia necessitating airway intervention, PONV, and rare risks such as cardiac or respiratory or neurological events, and allergic reactions. Discussed the role of CRNA in patient's perioperative care. Patient understands.)         Anesthesia Quick Evaluation

## 2022-11-20 NOTE — Telephone Encounter (Signed)
Copied from [place] [id number]. Topic: General - Other >> Nov 20, 2022 11:41 AM Oley Balm A wrote: Reason for CRM: Pt is calling requesting to speak with her PCP regarding her blood pressure medication that she was never prescribed. Please call pt back.

## 2022-11-20 NOTE — Op Note (Signed)
[facility] Gastroenterology Patient Name: Kristina Reed Procedure Date: 11/20/2022 9:47 AM MRN: [id number] Account #: [id number] Date of Birth: [birth year] Admit Type: Outpatient Age: 57 Room: [facility] ENDO ROOM 2 Gender: Female Note Status: Finalized Instrument Name: Upper Endoscope [id number] Procedure:             Upper GI endoscopy Indications:           Esophageal dysphagia, Follow-up of eosinophilic                         esophagitis Providers:             Lin Landsman MD, MD Referring MD:          No Local Md, MD (Referring MD) Medicines:             General Anesthesia Complications:         No immediate complications. Estimated blood loss: None. Procedure:             Pre-Anesthesia Assessment:                        - Prior to the procedure, a History and Physical was                         performed, and patient medications and allergies were                         reviewed. The patient is competent. The risks and                         benefits of the procedure and the sedation options and                         risks were discussed with the patient. All questions                         were answered and informed consent was obtained.                         Patient identification and proposed procedure were                         verified by the physician, the nurse, the                         anesthesiologist, the anesthetist and the technician                         in the pre-procedure area in the procedure room in the                         endoscopy suite. Mental Status Examination: alert and                         oriented. Airway Examination: normal oropharyngeal                         airway and neck mobility. Respiratory Examination:  clear to auscultation. CV Examination: normal.                         Prophylactic Antibiotics: The patient does not require                         prophylactic antibiotics. Prior  Anticoagulants: The                         patient has taken no anticoagulant or antiplatelet                         agents. ASA Grade Assessment: II - A patient with mild                         systemic disease. After reviewing the risks and                         benefits, the patient was deemed in satisfactory                         condition to undergo the procedure. The anesthesia                         plan was to use general anesthesia. Immediately prior                         to administration of medications, the patient was                         re-assessed for adequacy to receive sedatives. The                         heart rate, respiratory rate, oxygen saturations,                         blood pressure, adequacy of pulmonary ventilation, and                         response to care were monitored throughout the                         procedure. The physical status of the patient was                         re-assessed after the procedure.                        After obtaining informed consent, the endoscope was                         passed under direct vision. Throughout the procedure,                         the patient's blood pressure, pulse, and oxygen                         saturations were monitored continuously. The Endoscope  was introduced through the mouth, and advanced to the                         second part of duodenum. The upper GI endoscopy was                         accomplished without difficulty. The patient tolerated                         the procedure well. Findings:      The duodenal bulb and second portion of the duodenum were normal.      Multiple dispersed diminutive erosions with no bleeding and no stigmata       of recent bleeding were found in the gastric antrum and in the       prepyloric region of the stomach. Biopsies were taken with a cold       forceps for Helicobacter pylori testing.      The gastric  body was normal. Biopsies were taken with a cold forceps for       Helicobacter pylori testing.      The cardia and gastric fundus were normal on retroflexion.      The gastroesophageal junction and examined esophagus were normal.       Biopsies were obtained from the proximal and distal esophagus with cold       forceps for histology of suspected eosinophilic esophagitis. Impression:            - Normal duodenal bulb and second portion of the                         duodenum.                        - Erosive gastropathy with no bleeding and no stigmata                         of recent bleeding. Biopsied.                        - Normal gastric body. Biopsied.                        - Normal gastroesophageal junction and esophagus.                        - Biopsies were taken with a cold forceps for                         evaluation of eosinophilic esophagitis. Recommendation:        - Discharge patient to home (with escort).                        - Resume previous diet today.                        - Continue present medications.                        - Await pathology results. Procedure Code(s):     --- Professional ---  [id number], Esophagogastroduodenoscopy, flexible,                         transoral; with biopsy, single or multiple Diagnosis Code(s):     --- Professional ---                        K31.89, Other diseases of stomach and duodenum                        R13.14, Dysphagia, pharyngoesophageal phase                        [id number], Eosinophilic esophagitis CPT copyright 2022 American [employer] Association. All rights reserved. The codes documented in this report are preliminary and upon coder review may  be revised to meet current compliance requirements. Dr. Ulyess Mort Lin Landsman MD, MD 11/20/2022 10:11:28 AM This report has been signed electronically. Number of Addenda: 0 Note Initiated On: 11/20/2022 9:47 AM Estimated Blood Loss:  Estimated  blood loss: none.      [facility]

## 2022-11-20 NOTE — Anesthesia Postprocedure Evaluation (Signed)
Anesthesia Post Note  Patient: Kristina Reed  Procedure(s) Performed: ESOPHAGOGASTRODUODENOSCOPY (EGD) WITH PROPOFOL  Patient location during evaluation: Endoscopy Anesthesia Type: General Level of consciousness: awake and alert Pain management: pain level controlled Vital Signs Assessment: post-procedure vital signs reviewed and stable Respiratory status: spontaneous breathing, nonlabored ventilation, respiratory function stable and patient connected to nasal cannula oxygen Cardiovascular status: blood pressure returned to baseline and stable Postop Assessment: no apparent nausea or vomiting Anesthetic complications: no   No notable events documented.   Last Vitals:  Vitals:   11/20/22 1027 11/20/22 1048  BP: (!) 160/90 (!) 153/105  Pulse:    Resp:    Temp:    SpO2:      Last Pain:  Vitals:   11/20/22 1048  TempSrc:   PainSc: 0-No pain                 Arita Miss

## 2022-11-20 NOTE — Transfer of Care (Signed)
Immediate Anesthesia Transfer of Care Note  Patient: Kristina Reed  Procedure(s) Performed: ESOPHAGOGASTRODUODENOSCOPY (EGD) WITH PROPOFOL  Patient Location: Endoscopy Unit  Anesthesia Type:General  Level of Consciousness: drowsy  Airway & Oxygen Therapy: Patient Spontanous Breathing  Post-op Assessment: Report given to RN and Post -op Vital signs reviewed and stable  Post vital signs: Reviewed and stable  Last Vitals:  Vitals Value Taken Time  BP 126/82   Temp    Pulse 67 11/20/22 1012  Resp 10 11/20/22 1012  SpO2 100 % 11/20/22 1012  Vitals shown include unvalidated device data.  Last Pain:  Vitals:   11/20/22 1011  TempSrc:   PainSc: 0-No pain         Complications: No notable events documented.

## 2022-11-20 NOTE — Anesthesia Procedure Notes (Signed)
Date/Time: 11/20/2022 9:57 AM  Performed by: Lily Peer, Angle Karel, CRNAPre-anesthesia Checklist: Emergency Drugs available, Suction available, Patient identified, Patient being monitored and Timeout performed Patient Re-evaluated:Patient Re-evaluated prior to induction Oxygen Delivery Method: Simple face mask Induction Type: IV induction

## 2022-11-20 NOTE — Telephone Encounter (Signed)
I dont see any active rx's for bp meds?

## 2022-11-20 NOTE — Telephone Encounter (Signed)
Pt states high during procedure, notified per DR. Andrews monitor at home if continues to be high let us know, if develops chest pain or SOB go to ER.

## 2022-11-20 NOTE — H&P (Signed)
Cephas Darby, MD [address]  [place]  [place], [place] [address]  Main: [phone]  Fax: [phone] Pager: [phone]  Primary Care Physician:  Teodora Medici, DO Primary Gastroenterologist:  Dr. Cephas Darby  Pre-Procedure History & Physical: HPI:  Kristina Reed is a 57 y.o. female is here for an endoscopy.   Past Medical History:  Diagnosis Date   Anxiety    Arthritis    right knee and right elbow   Cancer ([place])    Cervical CA with partial hysterectomy.   Cognitive impairment, mild, so stated    Depression    Diverticulosis    Eosinophilic esophagitis [id number]   Biopsy Dec 2018   Esophageal dysphagia    Family history of breast cancer 05/2021   cancer genetic testing letter sent   Gallstones    GERD (gastroesophageal reflux disease)    History of cervical cancer    Hx of cervical cancer 01/30/2016   Hypertension    Hypothyroidism    Sleep apnea    does not use a C-PAP   Status post partial hysterectomy    Due to Cervical CA   Vaginal inclusion cyst     Past Surgical History:  Procedure Laterality Date   ABDOMINAL HYSTERECTOMY     partial hysterectomy   BREAST BIOPSY Bilateral 01/01/2016   Fibroadenoma   COLONOSCOPY WITH PROPOFOL N/A 06/27/2015   Procedure: COLONOSCOPY WITH PROPOFOL;  Surgeon: Josefine Class, MD;  Location: [facility] ENDOSCOPY;  Service: Endoscopy;  Laterality: N/A;   COLONOSCOPY WITH PROPOFOL N/A 09/23/2017   Procedure: COLONOSCOPY WITH PROPOFOL;  Surgeon: Jonathon Bellows, MD;  Location: [facility] ENDOSCOPY;  Service: Gastroenterology;  Laterality: N/A;   COLONOSCOPY WITH PROPOFOL N/A 06/09/2021   Procedure: COLONOSCOPY WITH PROPOFOL;  Surgeon: Lin Landsman, MD;  Location: [facility] ENDOSCOPY;  Service: Gastroenterology;  Laterality: N/A;   ESOPHAGOGASTRODUODENOSCOPY (EGD) WITH PROPOFOL N/A 07/22/2017   Procedure: ESOPHAGOGASTRODUODENOSCOPY (EGD) WITH PROPOFOL;  Surgeon: Jonathon Bellows, MD;  Location: [facility] ENDOSCOPY;  Service:  Gastroenterology;  Laterality: N/A;   ESOPHAGOGASTRODUODENOSCOPY (EGD) WITH PROPOFOL N/A 03/31/2018   Procedure: ESOPHAGOGASTRODUODENOSCOPY (EGD) WITH PROPOFOL;  Surgeon: Lin Landsman, MD;  Location: [facility] ENDOSCOPY;  Service: Gastroenterology;  Laterality: N/A;   ESOPHAGOGASTRODUODENOSCOPY (EGD) WITH PROPOFOL N/A 08/25/2021   Procedure: ESOPHAGOGASTRODUODENOSCOPY (EGD) WITH PROPOFOL;  Surgeon: Lin Landsman, MD;  Location: [facility] ENDOSCOPY;  Service: Gastroenterology;  Laterality: N/A;   KNEE ARTHROSCOPY Right 07/22/2016   Procedure: ARTHROSCOPY KNEE debridement microfracture;  Surgeon: Leanor Kail, MD;  Location: [facility] ORS;  Service: Orthopedics;  Laterality: Right;   KNEE ARTHROSCOPY WITH MEDIAL MENISECTOMY Left 11/10/2017   Procedure: KNEE ARTHROSCOPY WITH PARTIAL MEDIAL MENISECTOMY&patella femoral debridement, & ablation;  Surgeon: Leanor Kail, MD;  Location: [facility] ORS;  Service: Orthopedics;  Laterality: Left;   TOTAL KNEE ARTHROPLASTY Right 11/24/2021   Procedure: TOTAL KNEE ARTHROPLASTY;  Surgeon: Lovell Sheehan, MD;  Location: [facility] ORS;  Service: Orthopedics;  Laterality: Right;   TUBAL LIGATION      Prior to Admission medications   Medication Sig Start Date End Date Taking? Authorizing Provider  FLUoxetine (PROZAC) 20 MG capsule TAKE 1 CAPSULE BY MOUTH ONCE DAILY 10/06/22  Yes Teodora Medici, DO  levothyroxine (SYNTHROID) 75 MCG tablet Take 1 tablet (75 mcg total) by mouth daily before breakfast. 08/19/22  Yes Teodora Medici, DO  meloxicam (MOBIC) 15 MG tablet Take 15 mg by mouth daily. 02/04/22  Yes [provider]  omeprazole (PRILOSEC) 20 MG capsule Take 1 capsule (20 mg  total) by mouth daily. 08/19/22  Yes Teodora Medici, DO    Allergies as of 11/16/2022 - Review Complete 11/16/2022  Allergen Reaction Noted   Prednisone Nausea And Vomiting 07/23/2021   Methylprednisolone Palpitations 05/12/2021   Percocet [oxycodone-acetaminophen] Palpitations  01/20/2015    Family History  Problem Relation Age of Onset   Diabetes Mother    Lung cancer Father 62   Breast cancer Maternal Grandmother 65   Cancer Paternal Grandmother        cancer?   Breast cancer Maternal Aunt        80s   Cirrhosis Cousin    Breast cancer Cousin    Breast cancer Other        x4    Social History   Socioeconomic History   Marital status: Legally Separated    Spouse name: Jeneen Rinks   Number of children: 2   Years of education: Not on file   Highest education level: Not on file  Occupational History   Occupation: un employed  Tobacco Use   Smoking status: Never    Passive exposure: Never   Smokeless tobacco: Never  Vaping Use   Vaping Use: Never used  Substance and Sexual Activity   Alcohol use: Not Currently   Drug use: Not Currently   Sexual activity: Not Currently    Birth control/protection: Surgical  Other Topics Concern   Not on file  Social History Narrative   Not on file   Social Determinants of Health   Financial Resource Strain: Low Risk  (03/26/2021)   Overall Financial Resource Strain (CARDIA)    Difficulty of Paying Living Expenses: Not hard at all  Food Insecurity: No Food Insecurity (03/26/2021)   Hunger Vital Sign    Worried About Running Out of Food in the Last Year: Never true    [place] in the Last Year: Never true  Transportation Needs: Unmet Transportation Needs (03/26/2021)   PRAPARE - Transportation    Lack of Transportation (Medical): Yes    Lack of Transportation (Non-Medical): Yes  Physical Activity: Inactive (03/26/2021)   Exercise Vital Sign    Days of Exercise per Week: 0 days    Minutes of Exercise per Session: 0 min  Stress: Stress Concern Present (03/26/2021)   [place]    Feeling of Stress : To some extent  Social Connections: Socially Isolated (03/26/2021)   Social Connection and Isolation Panel [NHANES]    Frequency of  Communication with Friends and Family: Once a week    Frequency of Social Gatherings with Friends and Family: Once a week    Attends Religious Services: Never    Marine scientist or Organizations: No    Attends Archivist Meetings: Never    Marital Status: Separated  Intimate Partner Violence: Not At Risk (03/26/2021)   Humiliation, Afraid, Rape, and Kick questionnaire    Fear of Current or Ex-Partner: No    Emotionally Abused: No    Physically Abused: No    Sexually Abused: No    Review of Systems: See HPI, otherwise negative ROS  Physical Exam: BP (!) 150/93   Pulse 68   Temp (!) 96.6 F (35.9 C) (Temporal)   Resp 16   Ht '5\' 1"'$  (1.549 m)   Wt 77.6 kg   LMP 05/09/1993 (Approximate)   SpO2 100%   BMI 32.31 kg/m  General:   Alert,  pleasant and cooperative in NAD Head:  Normocephalic  and atraumatic. Neck:  Supple; no masses or thyromegaly. Lungs:  Clear throughout to auscultation.    Heart:  Regular rate and rhythm. Abdomen:  Soft, nontender and nondistended. Normal bowel sounds, without guarding, and without rebound.   Neurologic:  Alert and  oriented x4;  grossly normal neurologically.  Impression/Plan: Kristina Reed is here for an endoscopy to be performed for dysphagia  Risks, benefits, limitations, and alternatives regarding  endoscopy have been reviewed with the patient.  Questions have been answered.  All parties agreeable.   Sherri Sear, MD  11/20/2022, 9:08 AM

## 2022-11-23 ENCOUNTER — Encounter: Payer: Self-pay | Admitting: Gastroenterology

## 2022-11-23 ENCOUNTER — Telehealth: Payer: Self-pay | Admitting: Internal Medicine

## 2022-11-23 LAB — SURGICAL PATHOLOGY

## 2022-11-23 NOTE — Telephone Encounter (Unsigned)
Copied from [place] [id number]. Topic: General - Other >> Nov 23, 2022  8:34 AM Chapman Fitch wrote: Reason for CRM: Pt called to give Dr. Rosana Berger a report of the last few BP readings  Sat 146/98 Sunday -134/93  Today 142/98

## 2022-11-25 ENCOUNTER — Telehealth: Payer: Self-pay

## 2022-11-25 DIAGNOSIS — B351 Tinea unguium: Secondary | ICD-10-CM | POA: Diagnosis not present

## 2022-11-25 DIAGNOSIS — M79674 Pain in right toe(s): Secondary | ICD-10-CM | POA: Diagnosis not present

## 2022-11-25 DIAGNOSIS — M79675 Pain in left toe(s): Secondary | ICD-10-CM | POA: Diagnosis not present

## 2022-11-25 DIAGNOSIS — Z7689 Persons encountering health services in other specified circumstances: Secondary | ICD-10-CM | POA: Diagnosis not present

## 2022-11-25 DIAGNOSIS — H5213 Myopia, bilateral: Secondary | ICD-10-CM | POA: Diagnosis not present

## 2022-11-25 MED ORDER — OMEPRAZOLE 40 MG PO CPDR: 40.0000 mg | DELAYED_RELEASE_CAPSULE | Freq: Two times a day (BID) | ORAL | 2 refills | Status: DC

## 2022-11-25 NOTE — Telephone Encounter (Signed)
-----   Message from Lin Landsman, MD sent at 11/25/2022  9:04 AM EDT ----- Please inform patient that the pathology results from upper endoscopy came back normal.  Recommend to take Prilosec 40 mg 1-2 times daily before meals and please refer her to [facility] for esophageal manometry to evaluate for esophageal dysmotility if patient is agreeable Dx: Dysphagia, normal EGD  RV

## 2022-11-25 NOTE — Telephone Encounter (Signed)
Patient verbalized understanding of instructions. Sent omeprazole to the pharmacy and she is okay with referral. Placed referral to [facility] for Esophageal manometry on [facility] care links and faxed the referral form

## 2022-12-04 DIAGNOSIS — Z7689 Persons encountering health services in other specified circumstances: Secondary | ICD-10-CM | POA: Diagnosis not present

## 2022-12-13 NOTE — Progress Notes (Signed)
Established Patient Office Visit  Subjective   Patient ID: Kristina Reed, female    DOB: 15-Nov-1965  Age: 57 y.o. MRN: [id number]  Chief Complaint  Patient presents with   Headache    Headaches on/off, elevated bp reading at home. Pt brought log sheet    HPI Kristina Reed is a 57 year old female here for follow up on chronic medical conditions and recheck of blood pressure.   Hypertension: -Medications: Nothing currently, had been on Clonidine 0.1 mg daily  -Checking BP at home (average): 135-150/85-100 -Denies any SOB, CP, vision changes, LE edema or symptoms of hypotension. Does endorse occasional headaches which are new for her.  HLD: -Medications: Nothing -Last lipid panel: Lipid Panel     Component Value Date/Time   CHOL 213 (H) 05/20/2022 1110   TRIG 119 05/20/2022 1110   HDL 78 05/20/2022 1110   CHOLHDL 2.7 05/20/2022 1110   VLDL 26 09/02/2016 0903   LDLCALC 112 (H) 05/20/2022 1110    The 10-year ASCVD risk score (Arnett DK, et al., 2019) is: 1.5%   Values used to calculate the score:     Age: 27 years     Sex: Female     Is Non-Hispanic African American: No     Diabetic: No     Tobacco smoker: No     Systolic Blood Pressure: 134 mmHg     Is BP treated: No     HDL Cholesterol: 78 mg/dL     Total Cholesterol: 213 mg/dL   Hypothyroidism: -Medications: Levothyroxine 75 mcg -Patient is compliant with the above medication (s) at the above dose and reports no medication side effects.  -Denies weight changes, cold./heat intolerance, skin changes, anxiety/palpitations  -Last TSH: 1/24 4.48  GERD: -Currently on omeprazole 20 mg daily -Was seen by GI and had EGD 11/20/22 which was negative, planning on following with with GI in [place] later this week  Elevated LFTs: -Labs from 9/23 showing AST 46, ALT 55, improved on labs from 1/24 -CT abdomen and pelvis from 04/07/2022 showing fatty liver. -Patient has been working on diet and losing weight.  Review of  Systems  Constitutional:  Negative for chills and fever.  Eyes:  Negative for blurred vision.  Respiratory:  Negative for shortness of breath.   Cardiovascular:  Negative for chest pain.  Neurological:  Positive for headaches. Negative for dizziness.      Objective:     BP (!) 134/94   Pulse 82   Temp 98.1 F (36.7 C) (Oral)   Resp 16   Ht 5\' 1"  (1.549 m)   Wt 175 lb 3.2 oz (79.5 kg)   LMP 05/09/1993 (Approximate)   SpO2 98%   BMI 33.10 kg/m  BP Readings from Last 3 Encounters:  12/14/22 (!) 134/94  11/20/22 (!) 153/105  11/16/22 (!) 149/98   Wt Readings from Last 3 Encounters:  12/14/22 175 lb 3.2 oz (79.5 kg)  11/20/22 171 lb (77.6 kg)  11/16/22 172 lb 2 oz (78.1 kg)      Physical Exam Constitutional:      Appearance: Normal appearance.  HENT:     Head: Normocephalic and atraumatic.  Eyes:     Conjunctiva/sclera: Conjunctivae normal.  Cardiovascular:     Rate and Rhythm: Normal rate and regular rhythm.  Pulmonary:     Effort: Pulmonary effort is normal.     Breath sounds: Normal breath sounds.  Abdominal:     Comments: Tenderness to palpation in the LLQ  Skin:    General: Skin is warm and dry.  Neurological:     General: No focal deficit present.     Mental Status: She is alert. Mental status is at baseline.  Psychiatric:        Mood and Affect: Mood normal.        Behavior: Behavior normal.     No results found for any visits on 12/14/22.  Last CBC Lab Results  Component Value Date   WBC 7.0 10/05/2022   HGB 13.5 10/05/2022   HCT 40.2 10/05/2022   MCV 84.3 10/05/2022   MCH 28.3 10/05/2022   RDW 12.3 10/05/2022   PLT 254 10/05/2022   Last metabolic panel Lab Results  Component Value Date   GLUCOSE 81 10/05/2022   NA 139 10/05/2022   K 3.9 10/05/2022   CL 102 10/05/2022   CO2 28 10/05/2022   BUN 9 10/05/2022   CREATININE 0.71 10/05/2022   GFRNONAA >60 04/08/2022   CALCIUM 9.3 10/05/2022   PROT 7.2 10/05/2022   ALBUMIN 4.0  04/08/2022   LABGLOB 2.9 01/13/2016   AGRATIO 1.6 01/13/2016   BILITOT 0.3 10/05/2022   ALKPHOS 87 04/08/2022   AST 19 10/05/2022   ALT 25 10/05/2022   ANIONGAP 10 04/08/2022   Last lipids Lab Results  Component Value Date   CHOL 213 (H) 05/20/2022   HDL 78 05/20/2022   LDLCALC 112 (H) 05/20/2022   TRIG 119 05/20/2022   CHOLHDL 2.7 05/20/2022   Last hemoglobin A1c Lab Results  Component Value Date   HGBA1C 5.8 (H) 03/03/2022   Last thyroid functions Lab Results  Component Value Date   TSH 4.48 10/05/2022   Last vitamin D No results found for: "25OHVITD2", "25OHVITD3", "VD25OH" Last vitamin B12 and Folate No results found for: "VITAMINB12", "FOLATE"    The 10-year ASCVD risk score (Arnett DK, et al., 2019) is: 1.5%    Assessment & Plan:   1. Hypertension, unspecified type: Blood pressure borderline elevated today, higher at home with occasional headaches. Will start Lisinopril 5 mg daily, have her continue to monitor blood pressure at home and follow up here in 1 month to recheck.   - lisinopril (ZESTRIL) 5 MG tablet; Take 1 tablet (5 mg total) by mouth daily.  Dispense: 30 tablet; Refill: 1  2. Adult hypothyroidism: Stable, doing well on Levothyroxine 75 mcg dose.   3. Gastroesophageal reflux disease, unspecified whether esophagitis present: Currently on Prilosec 20 mg having some globus sensation, EGD reviewed from 11/20/22 which was normal, following up with GI in [place] later this week.   Return in about 4 weeks (around 01/11/2023).    Margarita Mail, DO

## 2022-12-14 ENCOUNTER — Ambulatory Visit (INDEPENDENT_AMBULATORY_CARE_PROVIDER_SITE_OTHER): Payer: Medicaid Other | Admitting: Internal Medicine

## 2022-12-14 ENCOUNTER — Encounter: Payer: Self-pay | Admitting: Internal Medicine

## 2022-12-14 VITALS — BP 134/94 | HR 82 | Temp 98.1°F | Resp 16 | Ht 61.0 in | Wt 175.2 lb

## 2022-12-14 DIAGNOSIS — K219 Gastro-esophageal reflux disease without esophagitis: Secondary | ICD-10-CM | POA: Diagnosis not present

## 2022-12-14 DIAGNOSIS — Z7689 Persons encountering health services in other specified circumstances: Secondary | ICD-10-CM | POA: Diagnosis not present

## 2022-12-14 DIAGNOSIS — E039 Hypothyroidism, unspecified: Secondary | ICD-10-CM

## 2022-12-14 DIAGNOSIS — Z419 Encounter for procedure for purposes other than remedying health state, unspecified: Secondary | ICD-10-CM | POA: Diagnosis not present

## 2022-12-14 DIAGNOSIS — I10 Essential (primary) hypertension: Secondary | ICD-10-CM

## 2022-12-14 MED ORDER — LISINOPRIL 5 MG PO TABS: 5.0000 mg | ORAL_TABLET | Freq: Every day | ORAL | 1 refills | Status: DC

## 2022-12-14 NOTE — Patient Instructions (Signed)
It was great seeing you today!  Plan discussed at today's visit: -Start Lisinopril 5 mg daily and continue to monitor blood pressure at home  Follow up in: 1 month   Take care and let us know if you have any questions or concerns prior to your next visit.  Dr. Rosana Berger  Lisinopril Tablets What is this medication? LISINOPRIL (lyse IN oh pril) treats high blood pressure and heart failure. It may also be used to prevent further damage after a heart attack. It works by relaxing blood vessels, which decreases the amount of work the heart has to do. It belongs to a group of medications called ACE inhibitors. This medicine may be used for other purposes; ask your health care provider or pharmacist if you have questions. COMMON BRAND NAME(S): Prinivil, Zestril What should I tell my care team before I take this medication? They need to know if you have any of these conditions: Diabetes Heart or blood vessel disease History of swelling of the tongue, face, or lips with difficulty breathing, difficulty swallowing, hoarseness, or tightening of the throat (angioedema) Kidney disease Low blood pressure An unusual or allergic reaction to lisinopril, insect venom, other medications, foods, dyes, or preservatives Pregnant or trying to get pregnant Breastfeeding How should I use this medication? Take this medication by mouth. Take it as directed on the prescription label at the same time every day. You can take it with or without food. If it upsets your stomach, take it with food. Keep taking it unless your care team tells you to stop. Talk to your care team about the use of this medication in children. While it may be prescribed for children as young as 6 for selected conditions, precautions do apply. Overdosage: If you think you have taken too much of this medicine contact a poison control center or emergency room at once. NOTE: This medicine is only for you. Do not share this medicine with  others. What if I miss a dose? If you miss a dose, take it as soon as you can. If it is almost time for your next dose, take only that dose. Do not take double or extra doses. What may interact with this medication? Do not take this medication with any of the following: Hymenoptera venom Sacubitril; valsartan This medication may also interact with the following: [place] Angiotensin receptor blockers, such as losartan or valsartan Certain medications for diabetes Diuretics Everolimus Gold compounds Lithium NSAIDs, medications for pain and inflammation, such as ibuprofen or naproxen Potassium salts or supplements Salt substitutes Sirolimus Temsirolimus This list may not describe all possible interactions. Give your health care provider a list of all the medicines, herbs, non-prescription drugs, or dietary supplements you use. Also tell them if you smoke, drink alcohol, or use illegal drugs. Some items may interact with your medicine. What should I watch for while using this medication? Visit your care team for regular checks on your progress. Check your blood pressure as directed. Know what your blood pressure should be and when to contact your care team. Do not treat yourself for coughs, colds, or pain while you are using this medication without asking your care team for advice. Some medications may increase your blood pressure. Talk to your care team if you may be pregnant. Serious birth defects can occur if you take this medication during pregnancy. This medication may affect your coordination, reaction time, or judgment. Do not drive or operate machinery until you know how this medication affects you. Sit up or  stand slowly to reduce the risk of dizzy or fainting spells. Drinking alcohol with this medication can increase the risk of these side effects. Avoid salt substitutes unless you are told otherwise by your care team. What side effects may I notice from receiving this  medication? Side effects that you should report to your care team as soon as possible: Allergic reactions or angioedema--skin rash, itching, hives, swelling of the face, eyes, lips, tongue, arms, or legs, trouble swallowing or breathing High potassium level--muscle weakness, fast or irregular heartbeat Kidney injury--decrease in the amount of urine, swelling of the ankles, hands, or feet Liver injury--right upper belly pain, loss of appetite, nausea, light-colored stool, dark yellow or brown urine, yellowing skin or eyes, unusual weakness, fatigue Low blood pressure--dizziness, feeling faint or lightheaded, blurry vision Side effects that usually do not require medical attention (report to your care team if they continue or are bothersome): Cough Dizziness Headache This list may not describe all possible side effects. Call your doctor for medical advice about side effects. You may report side effects to FDA at 1-800-FDA-1088. Where should I keep my medication? Keep out of the reach of children and pets. Store at room temperature between 20 and 25 degrees C (68 and 77 degrees F). Protect from moisture. Keep the container tightly closed. Do not freeze. Avoid exposure to extreme heat. Get rid of any unused medication after the expiration date. To get rid of medications that are no longer needed or have expired: Take the medication to a medication take-back program. Check with your pharmacy or law enforcement to find a location. If you cannot return the medication, check the label or package insert to see if the medication should be thrown out in the garbage or flushed down the toilet. If you are not sure, ask your care team. If it is safe to put in the trash, empty the medication out of the container. Mix the medication with cat litter, dirt, coffee grounds, or other unwanted substance. Seal the mixture in a bag or container. Put it in the trash. NOTE: This sheet is a summary. It may not cover all  possible information. If you have questions about this medicine, talk to your doctor, pharmacist, or health care provider.  2023 Elsevier/Gold Standard (2022-03-27 00:00:00)

## 2022-12-15 DIAGNOSIS — Z7689 Persons encountering health services in other specified circumstances: Secondary | ICD-10-CM | POA: Diagnosis not present

## 2022-12-15 DIAGNOSIS — M25561 Pain in right knee: Secondary | ICD-10-CM | POA: Diagnosis not present

## 2022-12-15 DIAGNOSIS — M25661 Stiffness of right knee, not elsewhere classified: Secondary | ICD-10-CM | POA: Diagnosis not present

## 2022-12-18 DIAGNOSIS — R1314 Dysphagia, pharyngoesophageal phase: Secondary | ICD-10-CM | POA: Diagnosis not present

## 2022-12-18 DIAGNOSIS — K2289 Other specified disease of esophagus: Secondary | ICD-10-CM | POA: Diagnosis not present

## 2022-12-18 DIAGNOSIS — K2950 Unspecified chronic gastritis without bleeding: Secondary | ICD-10-CM | POA: Diagnosis not present

## 2022-12-18 DIAGNOSIS — K219 Gastro-esophageal reflux disease without esophagitis: Secondary | ICD-10-CM | POA: Diagnosis not present

## 2022-12-21 DIAGNOSIS — D069 Carcinoma in situ of cervix, unspecified: Secondary | ICD-10-CM | POA: Insufficient documentation

## 2022-12-21 NOTE — Progress Notes (Unsigned)
PCP: Margarita Mail, DO   No chief complaint on file.   HPI:      [name] is a 57 y.o. [id number] whose LMP was Patient's last menstrual period was 05/09/1993 (approximate)., presents today for her annual examination.  Her menses are absent due to hyst for cervical cancer. She {does:18564} have vasomotor sx.   Sex activity: {sex active: 315163}. She {does:18564} have vaginal dryness. Hx of LS  Last Pap: 04/16/20 Results were: no abnormalities  Hx of STDs: {STD hx:14358}  Last mammogram: 05/12/22  Results were: normal--routine follow-up in 12 months There is no FH of breast cancer. There is no FH of ovarian cancer. The patient {does:18564} do self-breast exams.  Colonoscopy: 2020*** Repeat due after 10*** years.   Tobacco use: {tob:20664} Alcohol use: {Alcohol:11675} No drug use Exercise: {exercise:31265}  She {does:18564} get adequate calcium and Vitamin D in her diet.  Labs with PCP.   Patient Active Problem List   Diagnosis Date Noted   Erosive gastritis 11/20/2022   S/P TKR (total knee replacement) 11/25/2021   S/P TKR (total knee replacement) using cement, right 11/24/2021   Erosive esophagitis    Gastric erosion determined by endoscopy    History of colonic polyps    Chronic pain syndrome 11/26/2020   Bilateral primary osteoarthritis of knee 11/26/2020   Grade I internal hemorrhoids 11/16/2019   Genetic testing 04/20/2019   Family history of breast cancer    History of cervical cancer    Mastalgia 03/17/2019   Family hx-breast malignancy 12/16/2018   Dysphagia    Essential hypertension 01/19/2018   Obesity (BMI 30.0-34.9) 01/19/2018   Eosinophilic esophagitis 09/03/2017   Apophysitis 08/26/2017   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   Pain in joint, multiple sites 07/05/2017   Osteoarthritis of knee 07/05/2017   Lichen sclerosus et atrophicus 04/27/2017   Headache 04/27/2017   Headache disorder 02/17/2017   Post-concussion headache 02/17/2017    Medication monitoring encounter 09/21/2016   Hematuria 04/23/2016   Hx of cervical cancer 01/30/2016   Menopause 01/30/2016   Status post bilateral breast biopsy 01/07/2016   Chronic pain of both knees 07/23/2014   Depression, major, recurrent, moderate (HCC) 02/02/2014   Adult hypothyroidism 02/02/2014   Obstructive apnea 02/02/2014   Anxiety and depression 02/02/2014   Incomplete bladder emptying 12/12/2012   Tenosynovitis of foot 05/30/2012   Benign neoplasm of kidney 05/13/2012   Urge incontinence 05/13/2012   FOM (frequency of micturition) 05/13/2012   Chronic pain of right hand 05/05/2012   Tarsal tunnel syndrome 03/03/2012    Past Surgical History:  Procedure Laterality Date   ABDOMINAL HYSTERECTOMY     partial hysterectomy   BREAST BIOPSY Bilateral 01/01/2016   Fibroadenoma   COLONOSCOPY WITH PROPOFOL N/A 06/27/2015   Procedure: COLONOSCOPY WITH PROPOFOL;  Surgeon: Elnita Maxwell, MD;  Location: [facility] ENDOSCOPY;  Service: Endoscopy;  Laterality: N/A;   COLONOSCOPY WITH PROPOFOL N/A 09/23/2017   Procedure: COLONOSCOPY WITH PROPOFOL;  Surgeon: Wyline Mood, MD;  Location: [facility] ENDOSCOPY;  Service: Gastroenterology;  Laterality: N/A;   COLONOSCOPY WITH PROPOFOL N/A 06/09/2021   Procedure: COLONOSCOPY WITH PROPOFOL;  Surgeon: Toney Reil, MD;  Location: [facility] ENDOSCOPY;  Service: Gastroenterology;  Laterality: N/A;   ESOPHAGOGASTRODUODENOSCOPY (EGD) WITH PROPOFOL N/A 07/22/2017   Procedure: ESOPHAGOGASTRODUODENOSCOPY (EGD) WITH PROPOFOL;  Surgeon: Wyline Mood, MD;  Location: [facility] ENDOSCOPY;  Service: Gastroenterology;  Laterality: N/A;   ESOPHAGOGASTRODUODENOSCOPY (EGD) WITH PROPOFOL N/A 03/31/2018   Procedure: ESOPHAGOGASTRODUODENOSCOPY (EGD) WITH PROPOFOL;  Surgeon: Lannette Donath  Betti Cruzeddy, MD;  [facility];  Service: Gastroenterology;  Laterality: N/A;   ESOPHAGOGASTRODUODENOSCOPY (EGD) WITH PROPOFOL N/A 08/25/2021   Procedure: ESOPHAGOGASTRODUODENOSCOPY  (EGD) WITH PROPOFOL;  Surgeon: Toney ReilVanga, Rohini Reddy, MD;  [facility];  Service: Gastroenterology;  Laterality: N/A;   ESOPHAGOGASTRODUODENOSCOPY (EGD) WITH PROPOFOL N/A 11/20/2022   Procedure: ESOPHAGOGASTRODUODENOSCOPY (EGD) WITH PROPOFOL;  Surgeon: Toney ReilVanga, Rohini Reddy, MD;  [facility];  Service: Gastroenterology;  Laterality: N/A;   KNEE ARTHROSCOPY Right 07/22/2016   Procedure: ARTHROSCOPY KNEE debridement microfracture;  Surgeon: Erin SonsHarold Kernodle, MD;  Location: [facility] ORS;  Service: Orthopedics;  Laterality: Right;   KNEE ARTHROSCOPY WITH MEDIAL MENISECTOMY Left 11/10/2017   Procedure: KNEE ARTHROSCOPY WITH PARTIAL MEDIAL MENISECTOMY&patella femoral debridement, & ablation;  Surgeon: Erin SonsKernodle, Harold, MD;  Location: [facility] ORS;  Service: Orthopedics;  Laterality: Left;   TOTAL KNEE ARTHROPLASTY Right 11/24/2021   Procedure: TOTAL KNEE ARTHROPLASTY;  Surgeon: Lyndle HerrlichBowers, James R, MD;  Location: [facility] ORS;  Service: Orthopedics;  Laterality: Right;   TUBAL LIGATION      Family History  Problem Relation Age of Onset   Diabetes Mother    Lung cancer Father [id number]   Breast cancer Maternal Grandmother [phone]   Cancer Paternal Grandmother        cancer?   Breast cancer Maternal Aunt        80s   Cirrhosis Cousin    Breast cancer Cousin    Breast cancer Other        x4    Social History   Socioeconomic History   Marital status: Legally Separated    Spouse name: Fayrene FearingJames   Number of children: 2   Years of education: Not on file   Highest education level: Not on file  Occupational History   Occupation: un employed  Tobacco Use   Smoking status: Never    Passive exposure: Never   Smokeless tobacco: Never  Vaping Use   Vaping Use: Never used  Substance and Sexual Activity   Alcohol use: Not Currently   Drug use: Not Currently   Sexual activity: Not Currently    Birth control/protection: Surgical  Other Topics Concern   Not on file  Social History Narrative   Not on file    Social Determinants of Health   Financial Resource Strain: Low Risk  (03/26/2021)   Overall Financial Resource Strain (CARDIA)    Difficulty of Paying Living Expenses: Not hard at all  Food Insecurity: No Food Insecurity (03/26/2021)   Hunger Vital Sign    Worried About Running Out of Food in the Last Year: Never true    Ran Out of Food in the Last Year: Never true  Transportation Needs: Unmet Transportation Needs (03/26/2021)   PRAPARE - Transportation    Lack of Transportation (Medical): Yes    Lack of Transportation (Non-Medical): Yes  Physical Activity: Inactive (03/26/2021)   Exercise Vital Sign    Days of Exercise per Week: 0 days    Minutes of Exercise per Session: 0 min  Stress: Stress Concern Present (03/26/2021)   [employer] - Occupational Stress Questionnaire    Feeling of Stress : To some extent  Social Connections: Socially Isolated (03/26/2021)   Social Connection and Isolation Panel [NHANES]    Frequency of Communication with Friends and Family: Once a week    Frequency of Social Gatherings with Friends and Family: Once a week    Attends Religious Services: Never    Database administratorActive Member of Clubs or Organizations: No  Attends Banker Meetings: Never    Marital Status: Separated  Intimate Partner Violence: Not At Risk (03/26/2021)   Humiliation, Afraid, Rape, and Kick questionnaire    Fear of Current or Ex-Partner: No    Emotionally Abused: No    Physically Abused: No    Sexually Abused: No     Current Outpatient Medications:    FLUoxetine (PROZAC) 20 MG capsule, TAKE 1 CAPSULE BY MOUTH ONCE DAILY, Disp: 90 capsule, Rfl: 1   levothyroxine (SYNTHROID) 75 MCG tablet, Take 1 tablet (75 mcg total) by mouth daily before breakfast., Disp: 90 tablet, Rfl: 1   lisinopril (ZESTRIL) 5 MG tablet, Take 1 tablet (5 mg total) by mouth daily., Disp: 30 tablet, Rfl: 1   meloxicam (MOBIC) 15 MG tablet, Take 15 mg by mouth daily., Disp: , Rfl:     omeprazole (PRILOSEC) 40 MG capsule, Take 1 capsule (40 mg total) by mouth 2 (two) times daily before a meal., Disp: 60 capsule, Rfl: 2     ROS:  Review of Systems BREAST: No symptoms    Objective: LMP 05/09/1993 (Approximate)    OBGyn Exam  Results: No results found for this or any previous visit (from the past 24 hour(s)).  Assessment/Plan:  No diagnosis found.   No orders of the defined types were placed in this encounter.           GYN counsel {counseling: 16159}    F/U  No follow-ups on file.  Raheem Kolbe B. Leilana Mcquire, PA-C 12/21/2022 8:20 PM

## 2022-12-22 ENCOUNTER — Ambulatory Visit (INDEPENDENT_AMBULATORY_CARE_PROVIDER_SITE_OTHER): Payer: Medicaid Other | Admitting: Obstetrics and Gynecology

## 2022-12-22 ENCOUNTER — Other Ambulatory Visit (HOSPITAL_COMMUNITY)
Admission: RE | Admit: 2022-12-22 | Discharge: 2022-12-22 | Disposition: A | Discharge status: Home / Self Care | Payer: Medicaid Other | Source: Ambulatory Visit | Attending: Obstetrics and Gynecology | Admitting: Obstetrics and Gynecology

## 2022-12-22 ENCOUNTER — Encounter: Payer: Self-pay | Admitting: Obstetrics and Gynecology

## 2022-12-22 VITALS — BP 110/70 | Ht 61.0 in | Wt 167.0 lb

## 2022-12-22 DIAGNOSIS — Z1151 Encounter for screening for human papillomavirus (HPV): Secondary | ICD-10-CM

## 2022-12-22 DIAGNOSIS — Z1231 Encounter for screening mammogram for malignant neoplasm of breast: Secondary | ICD-10-CM

## 2022-12-22 DIAGNOSIS — N951 Menopausal and female climacteric states: Secondary | ICD-10-CM

## 2022-12-22 DIAGNOSIS — Z01419 Encounter for gynecological examination (general) (routine) without abnormal findings: Secondary | ICD-10-CM | POA: Diagnosis not present

## 2022-12-22 DIAGNOSIS — Z1211 Encounter for screening for malignant neoplasm of colon: Secondary | ICD-10-CM

## 2022-12-22 DIAGNOSIS — Z803 Family history of malignant neoplasm of breast: Secondary | ICD-10-CM

## 2022-12-22 DIAGNOSIS — Z124 Encounter for screening for malignant neoplasm of cervix: Secondary | ICD-10-CM | POA: Diagnosis not present

## 2022-12-22 DIAGNOSIS — D069 Carcinoma in situ of cervix, unspecified: Secondary | ICD-10-CM | POA: Diagnosis not present

## 2022-12-22 DIAGNOSIS — L900 Lichen sclerosus et atrophicus: Secondary | ICD-10-CM

## 2022-12-22 DIAGNOSIS — Z7689 Persons encountering health services in other specified circumstances: Secondary | ICD-10-CM | POA: Diagnosis not present

## 2022-12-22 MED ORDER — ESTRADIOL 0.5 MG PO TABS: 0.5000 mg | ORAL_TABLET | Freq: Every day | ORAL | 3 refills | Status: AC

## 2022-12-22 NOTE — Patient Instructions (Signed)
I value your feedback and you entrusting us with your care. If you get a [place] patient survey, I would appreciate you taking the time to let us know about your experience today. Thank you!  [facility]: [phone]      

## 2022-12-23 NOTE — Progress Notes (Deleted)
12/25/2022 10:00 AM   Kristina Reed 07/27/1966 [id number]  Referring provider: Margarita MailAndrews, Elisabeth, DO [address] [place],  [place] [id number]  Urological history: 1.  Urinary retention -post op retention after knee surgery  2.  High risk hematuria -Non-smoker -contrast CT (08/2022) - cystic lesion in the area of the urethra measures 2.7 x 2.6 cm and is not significantly changed compared with prior exams dating back to at least 02/07/2015 -cysto (08/2022) NED    No chief complaint on file.   HPI: Kristina Barreerry J Umeda is a 57 y.o. female who presents today for ine year follow up.   UA ***  PVR ***   PMH: Past Medical History:  Diagnosis Date   Anxiety    Arthritis    right knee and right elbow   Cancer    Cervical CA with partial hysterectomy.   Cognitive impairment, mild, so stated    Depression    Diverticulosis    Eosinophilic esophagitis 09/03/2017   Biopsy Dec 2018   Esophageal dysphagia    Family history of breast cancer 05/2021   cancer genetic testing letter sent   Gallstones    GERD (gastroesophageal reflux disease)    History of cervical cancer    Hx of cervical cancer 01/30/2016   Hypertension    Hypothyroidism    Sleep apnea    does not use a C-PAP   Status post partial hysterectomy    Due to Cervical CA   Vaginal inclusion cyst     Surgical History: Past Surgical History:  Procedure Laterality Date   ABDOMINAL HYSTERECTOMY     partial hysterectomy   BREAST BIOPSY Bilateral 01/01/2016   Fibroadenoma   COLONOSCOPY WITH PROPOFOL N/A 06/27/2015   Procedure: COLONOSCOPY WITH PROPOFOL;  Surgeon: Elnita MaxwellMatthew Gordon Rein, MD;  [facility];  Service: Endoscopy;  Laterality: N/A;   COLONOSCOPY WITH PROPOFOL N/A 09/23/2017   Procedure: COLONOSCOPY WITH PROPOFOL;  Surgeon: Wyline MoodAnna, Kiran, MD;  [facility];  Service: Gastroenterology;  Laterality: N/A;   COLONOSCOPY WITH PROPOFOL N/A 06/09/2021   Procedure:  COLONOSCOPY WITH PROPOFOL;  Surgeon: Toney ReilVanga, Rohini Reddy, MD;  [facility];  Service: Gastroenterology;  Laterality: N/A;   ESOPHAGOGASTRODUODENOSCOPY (EGD) WITH PROPOFOL N/A 07/22/2017   Procedure: ESOPHAGOGASTRODUODENOSCOPY (EGD) WITH PROPOFOL;  Surgeon: Wyline MoodAnna, Kiran, MD;  [facility];  Service: Gastroenterology;  Laterality: N/A;   ESOPHAGOGASTRODUODENOSCOPY (EGD) WITH PROPOFOL N/A 03/31/2018   Procedure: ESOPHAGOGASTRODUODENOSCOPY (EGD) WITH PROPOFOL;  Surgeon: Toney ReilVanga, Rohini Reddy, MD;  [facility];  Service: Gastroenterology;  Laterality: N/A;   ESOPHAGOGASTRODUODENOSCOPY (EGD) WITH PROPOFOL N/A 08/25/2021   Procedure: ESOPHAGOGASTRODUODENOSCOPY (EGD) WITH PROPOFOL;  Surgeon: Toney ReilVanga, Rohini Reddy, MD;  [facility];  Service: Gastroenterology;  Laterality: N/A;   ESOPHAGOGASTRODUODENOSCOPY (EGD) WITH PROPOFOL N/A 11/20/2022   Procedure: ESOPHAGOGASTRODUODENOSCOPY (EGD) WITH PROPOFOL;  Surgeon: Toney ReilVanga, Rohini Reddy, MD;  [facility];  Service: Gastroenterology;  Laterality: N/A;   KNEE ARTHROSCOPY Right 07/22/2016   Procedure: ARTHROSCOPY KNEE debridement microfracture;  Surgeon: Erin SonsHarold Kernodle, MD;  Location: [facility] ORS;  Service: Orthopedics;  Laterality: Right;   KNEE ARTHROSCOPY WITH MEDIAL MENISECTOMY Left 11/10/2017   Procedure: KNEE ARTHROSCOPY WITH PARTIAL MEDIAL MENISECTOMY&patella femoral debridement, & ablation;  Surgeon: Erin SonsKernodle, Harold, MD;  Location: [facility] ORS;  Service: Orthopedics;  Laterality: Left;   TOTAL KNEE ARTHROPLASTY Right 11/24/2021   Procedure: TOTAL KNEE ARTHROPLASTY;  Surgeon: Lyndle HerrlichBowers, James R, MD;  Location: [facility] ORS;  Service: Orthopedics;  Laterality: Right;   TUBAL LIGATION  Home Medications:  Allergies as of 12/25/2022       Reactions   Prednisone Nausea And Vomiting   Methylprednisolone Palpitations   Increased heart rate   Percocet [oxycodone-acetaminophen] Palpitations        Medication List         Accurate as of December 23, 2022 10:00 AM. If you have any questions, ask your nurse or doctor.          estradiol 0.5 MG tablet Commonly known as: Estrace Take 1 tablet (0.5 mg total) by mouth daily.   FLUoxetine 20 MG capsule Commonly known as: PROZAC TAKE 1 CAPSULE BY MOUTH ONCE DAILY   levothyroxine 75 MCG tablet Commonly known as: SYNTHROID Take 1 tablet (75 mcg total) by mouth daily before breakfast.   lisinopril 5 MG tablet Commonly known as: ZESTRIL Take 1 tablet (5 mg total) by mouth daily.   meloxicam 15 MG tablet Commonly known as: MOBIC Take 15 mg by mouth daily.   omeprazole 40 MG capsule Commonly known as: PRILOSEC Take 1 capsule (40 mg total) by mouth 2 (two) times daily before a meal.        Allergies:  Allergies  Allergen Reactions   Prednisone Nausea And Vomiting   Methylprednisolone Palpitations    Increased heart rate   Percocet [Oxycodone-Acetaminophen] Palpitations    Family History: Family History  Problem Relation Age of Onset   Diabetes Mother    Lung cancer Father 67   Breast cancer Maternal Grandmother 32   Cancer Paternal Grandmother        cancer?   Breast cancer Maternal Aunt        69s   Cirrhosis Cousin    Breast cancer Cousin    Breast cancer Other        x4    Social History:  reports that she has never smoked. She has never been exposed to tobacco smoke. She has never used smokeless tobacco. She reports that she does not currently use alcohol. She reports that she does not currently use drugs.  ROS: Pertinent ROS in HPI  Physical Exam: LMP 05/09/1993 (Approximate)   Constitutional:  Well nourished. Alert and oriented, No acute distress. HEENT: [place] AT, moist mucus membranes.  Trachea midline, no masses. Cardiovascular: No clubbing, cyanosis, or edema. Respiratory: Normal respiratory effort, no increased work of breathing. GI: Abdomen is soft, non tender, non distended, no abdominal masses. Liver and spleen  not palpable.  No hernias appreciated.  Stool sample for occult testing is not indicated.   GU: No CVA tenderness.  No bladder fullness or masses.  Patient with circumcised/uncircumcised phallus. ***Foreskin easily retracted***  Urethral meatus is patent.  No penile discharge. No penile lesions or rashes. Scrotum without lesions, cysts, rashes and/or edema.  Testicles are located scrotally bilaterally. No masses are appreciated in the testicles. Left and right epididymis are normal. Rectal: Patient with  normal sphincter tone. Anus and perineum without scarring or rashes. No rectal masses are appreciated. Prostate is approximately *** grams, *** nodules are appreciated. Seminal vesicles are normal. Skin: No rashes, bruises or suspicious lesions. Lymph: No cervical or inguinal adenopathy. Neurologic: Grossly intact, no focal deficits, moving all 4 extremities. Psychiatric: Normal mood and affect.  Laboratory Data: Lab Results  Component Value Date   WBC 7.0 10/05/2022   HGB 13.5 10/05/2022   HCT 40.2 10/05/2022   MCV 84.3 10/05/2022   PLT 254 10/05/2022    Lab Results  Component Value Date   CREATININE 0.71  10/05/2022    No results found for: "PSA"  No results found for: "TESTOSTERONE"  Lab Results  Component Value Date   HGBA1C 5.8 (H) 03/03/2022    Lab Results  Component Value Date   TSH 4.48 10/05/2022       Component Value Date/Time   CHOL 213 (H) 05/20/2022 1110   HDL 78 05/20/2022 1110   CHOLHDL 2.7 05/20/2022 1110   VLDL 26 09/02/2016 0903   LDLCALC 112 (H) 05/20/2022 1110    Lab Results  Component Value Date   AST 19 10/05/2022   Lab Results  Component Value Date   ALT 25 10/05/2022   No components found for: "ALKALINEPHOPHATASE" No components found for: "BILIRUBINTOTAL"  No results found for: "ESTRADIOL"  Urinalysis    Component Value Date/Time   COLORURINE YELLOW 10/17/2021 0935   APPEARANCEUR Hazy (A) 11/20/2021 1115   LABSPEC 1.025 10/17/2021  0935   LABSPEC 1.019 08/29/2014 1506   PHURINE 5.0 10/17/2021 0935   GLUCOSEU Negative 11/20/2021 1115   GLUCOSEU Negative 08/29/2014 1506   HGBUR NEGATIVE 10/17/2021 0935   BILIRUBINUR negative 04/02/2022 0802   BILIRUBINUR Negative 11/20/2021 1115   BILIRUBINUR Negative 08/29/2014 1506   KETONESUR NEGATIVE 10/17/2021 0935   PROTEINUR Positive (A) 04/02/2022 0802   PROTEINUR Negative 11/20/2021 1115   PROTEINUR NEGATIVE 10/17/2021 0935   UROBILINOGEN 0.2 04/02/2022 0802   NITRITE negative 04/02/2022 0802   NITRITE Negative 11/20/2021 1115   NITRITE NEGATIVE 10/17/2021 0935   LEUKOCYTESUR Negative 04/02/2022 0802   LEUKOCYTESUR Negative 11/20/2021 1115   LEUKOCYTESUR TRACE (A) 10/17/2021 0935   LEUKOCYTESUR Negative 08/29/2014 1506    I have reviewed the labs.   Pertinent Imaging: @CT @ @ultrasound @ @KUB @ I have independently reviewed the films.    Assessment & Plan:  ***  There are no diagnoses linked to this encounter.  No follow-ups on file.  These notes generated with voice recognition software. I apologize for typographical errors.  Cloretta Ned  [facility] [address]  [address] [place], [place] [address] [phone]

## 2022-12-24 LAB — CYTOLOGY - PAP
Comment: NEGATIVE
Diagnosis: NEGATIVE
High risk HPV: NEGATIVE

## 2022-12-25 ENCOUNTER — Ambulatory Visit: Payer: Medicaid Other | Admitting: Urology

## 2022-12-29 ENCOUNTER — Ambulatory Visit: Payer: Medicaid Other | Admitting: Cardiovascular Disease

## 2022-12-29 NOTE — Progress Notes (Signed)
12/30/2022 7:10 PM   Kristina Reed [birth year] [id number]  Referring provider: Margarita Mail, DO [address] [place],  [place] [address]  Urological history: 1.  Urinary retention -post op retention after knee surgery  2.  High risk hematuria -Non-smoker -contrast CT (08/2022) - cystic lesion in the area of the urethra measures 2.7 x 2.6 cm and is not significantly changed compared with prior exams dating back to at least 02/07/2015 -cysto (08/2022) NED  Chief Complaint  Patient presents with   Follow-up    HPI: Kristina Reed is a 57 y.o. female who presents today for one year follow up.   She is having 8 or more daytime urinations, 3 more nocturia with a strong urge to urinate.  She has both stress and urge incontinence.  She leaks 1-2 times daily.  She does not wear absorbent products for leakage.  She does not limit fluids.  She does engage in toilet mapping.  CATH UA yellow clear, specific gravity greater than 1.030, pH 5.0, trace leukocyte, 11-30 WBCs, 0-2 RBCs, 0-10 epithelial cells, mucus threads are present and moderate bacteria.  PVR 99 mL    PMH: Past Medical History:  Diagnosis Date   Anxiety    Arthritis    right knee and right elbow   Cancer    Cervical CA with partial hysterectomy.   Cognitive impairment, mild, so stated    Depression    Diverticulosis    Eosinophilic esophagitis 09/03/2017   Biopsy Dec 2018   Esophageal dysphagia    Family history of breast cancer 05/2021   cancer genetic testing letter sent   Gallstones    GERD (gastroesophageal reflux disease)    History of cervical cancer    Hx of cervical cancer 01/30/2016   Hypertension    Hypothyroidism    Sleep apnea    does not use a C-PAP   Status post partial hysterectomy    Due to Cervical CA   Vaginal inclusion cyst     Surgical History: Past Surgical History:  Procedure Laterality Date   ABDOMINAL HYSTERECTOMY     partial hysterectomy   BREAST BIOPSY  Bilateral 01/01/2016   Fibroadenoma   COLONOSCOPY WITH PROPOFOL N/A 06/27/2015   Procedure: COLONOSCOPY WITH PROPOFOL;  Surgeon: Elnita Maxwell, MD;  Location: [facility] ENDOSCOPY;  Service: Endoscopy;  Laterality: N/A;   COLONOSCOPY WITH PROPOFOL N/A 09/23/2017   Procedure: COLONOSCOPY WITH PROPOFOL;  Surgeon: Wyline Mood, MD;  Location: [facility] ENDOSCOPY;  Service: Gastroenterology;  Laterality: N/A;   COLONOSCOPY WITH PROPOFOL N/A 06/09/2021   Procedure: COLONOSCOPY WITH PROPOFOL;  Surgeon: Toney Reil, MD;  Location: [facility] ENDOSCOPY;  Service: Gastroenterology;  Laterality: N/A;   ESOPHAGOGASTRODUODENOSCOPY (EGD) WITH PROPOFOL N/A 07/22/2017   Procedure: ESOPHAGOGASTRODUODENOSCOPY (EGD) WITH PROPOFOL;  Surgeon: Wyline Mood, MD;  Location: [facility] ENDOSCOPY;  Service: Gastroenterology;  Laterality: N/A;   ESOPHAGOGASTRODUODENOSCOPY (EGD) WITH PROPOFOL N/A 03/31/2018   Procedure: ESOPHAGOGASTRODUODENOSCOPY (EGD) WITH PROPOFOL;  Surgeon: Toney Reil, MD;  Location: [facility] ENDOSCOPY;  Service: Gastroenterology;  Laterality: N/A;   ESOPHAGOGASTRODUODENOSCOPY (EGD) WITH PROPOFOL N/A 08/25/2021   Procedure: ESOPHAGOGASTRODUODENOSCOPY (EGD) WITH PROPOFOL;  Surgeon: Toney Reil, MD;  Location: [facility] ENDOSCOPY;  Service: Gastroenterology;  Laterality: N/A;   ESOPHAGOGASTRODUODENOSCOPY (EGD) WITH PROPOFOL N/A 11/20/2022   Procedure: ESOPHAGOGASTRODUODENOSCOPY (EGD) WITH PROPOFOL;  Surgeon: Toney Reil, MD;  Location: [facility] ENDOSCOPY;  Service: Gastroenterology;  Laterality: N/A;   KNEE ARTHROSCOPY Right 07/22/2016   Procedure: ARTHROSCOPY KNEE debridement microfracture;  Surgeon: Erin Sons, MD;  Location: [facility] ORS;  Service: Orthopedics;  Laterality: Right;   KNEE ARTHROSCOPY WITH MEDIAL MENISECTOMY Left 11/10/2017   Procedure: KNEE ARTHROSCOPY WITH PARTIAL MEDIAL MENISECTOMY&patella femoral debridement, & ablation;  Surgeon: Erin Sons, MD;  Location: [facility] ORS;  Service:  Orthopedics;  Laterality: Left;   TOTAL KNEE ARTHROPLASTY Right 11/24/2021   Procedure: TOTAL KNEE ARTHROPLASTY;  Surgeon: Lyndle Herrlich, MD;  Location: [facility] ORS;  Service: Orthopedics;  Laterality: Right;   TUBAL LIGATION      Home Medications:  Allergies as of 12/30/2022       Reactions   Prednisone Nausea And Vomiting   Methylprednisolone Palpitations   Increased heart rate   Percocet [oxycodone-acetaminophen] Palpitations        Medication List        Accurate as of December 30, 2022 11:59 PM. If you have any questions, ask your nurse or doctor.          estradiol 0.5 MG tablet Commonly known as: Estrace Take 1 tablet (0.5 mg total) by mouth daily.   FLUoxetine 20 MG capsule Commonly known as: PROZAC TAKE 1 CAPSULE BY MOUTH ONCE DAILY   levothyroxine 75 MCG tablet Commonly known as: SYNTHROID Take 1 tablet (75 mcg total) by mouth daily before breakfast.   lisinopril 5 MG tablet Commonly known as: ZESTRIL Take 1 tablet (5 mg total) by mouth daily.   meloxicam 15 MG tablet Commonly known as: MOBIC Take 15 mg by mouth daily.   omeprazole 40 MG capsule Commonly known as: PRILOSEC Take 1 capsule (40 mg total) by mouth 2 (two) times daily before a meal.        Allergies:  Allergies  Allergen Reactions   Prednisone Nausea And Vomiting   Methylprednisolone Palpitations    Increased heart rate   Percocet [Oxycodone-Acetaminophen] Palpitations    Family History: Family History  Problem Relation Age of Onset   Diabetes Mother    Lung cancer Father 1   Breast cancer Maternal Grandmother 37   Cancer Paternal Grandmother        cancer?   Breast cancer Maternal Aunt        45s   Cirrhosis Cousin    Breast cancer Cousin    Breast cancer Other        x4    Social History:  reports that she has never smoked. She has never been exposed to tobacco smoke. She has never used smokeless tobacco. She reports that she does not currently use alcohol. She  reports that she does not currently use drugs.  ROS: Pertinent ROS in HPI  Physical Exam: BP 120/81   Pulse 82   LMP 05/09/1993 (Approximate)   Constitutional:  Well nourished. Alert and oriented, No acute distress. HEENT: [place] AT, moist mucus membranes.  Trachea midline, no masses. Cardiovascular: No clubbing, cyanosis, or edema. Respiratory: Normal respiratory effort, no increased work of breathing. GU: No CVA tenderness.  No bladder fullness or masses. Vulvovaginal atrophy w/ pallor, loss of rugae, introital retraction.  Vulvar thinning, fusion of labia, clitoral hood retraction, prominent urethral meatus.   A large urethral diverticulum is palpated and it exquisitely tender to patient.  I was not able to express any pus from the diverticulum and the did not feel as though it contain any stone debris.  No bladder fullness, tenderness or masses. Pale vagina mucosa, poor estrogen effect, no discharge, no lesions, fair pelvic support, no cystocele and grade II rectocele noted.  Anus and  perineum are without rashes or lesions.    Neurologic: Grossly intact, no focal deficits, moving all 4 extremities. Psychiatric: Normal mood and affect.    Laboratory Data: Lab Results  Component Value Date   WBC 7.0 10/05/2022   HGB 13.5 10/05/2022   HCT 40.2 10/05/2022   MCV 84.3 10/05/2022   PLT 254 10/05/2022    Lab Results  Component Value Date   CREATININE 0.71 10/05/2022    Lab Results  Component Value Date   HGBA1C 5.8 (H) 03/03/2022    Lab Results  Component Value Date   TSH 4.48 10/05/2022       Component Value Date/Time   CHOL 213 (H) 05/20/2022 1110   HDL 78 05/20/2022 1110   CHOLHDL 2.7 05/20/2022 1110   VLDL 26 09/02/2016 0903   LDLCALC 112 (H) 05/20/2022 1110    Lab Results  Component Value Date   AST 19 10/05/2022   Lab Results  Component Value Date   ALT 25 10/05/2022    Urinalysis See EPIC and HPI I have reviewed the labs.   Pertinent Imaging:  12/30/22  11:52  Scan Result 99 ml   In and Out Catheterization  Patient is present today for a I & O catheterization due to inability to provide sample spontaneously. Patient was cleaned and prepped in a sterile fashion with betadine . A 14 FR cath was inserted no complications were noted , 90 ml of urine return was noted, urine was yellow clear in color. A clean urine sample was collected for urinalysis and urine culture. Bladder was drained  And catheter was removed with out difficulty.     Assessment & Plan:    1. High risk hematuria - non-smoker - work up 2023 - NED - no reports of gross heme - UA w/o micro heme  2. Urinary retention -resolved  3.  Urethral diverticulum -She is experiencing frequency, nocturia and urgency and the diverticulum is exquisitely painful on exam -CATH UA w/ pyuria and bacteriuria  -urine sent for culture  -I will have her follow-up with Dr. Sherron Monday for further evaluation and management   Return for appointment with Dr. Sherron Monday for urethral diverticulum .  These notes generated with voice recognition software. I apologize for typographical errors.  Cloretta Ned  [facility] [address]  [address] [place], [place] [address] [phone]

## 2022-12-30 ENCOUNTER — Ambulatory Visit (INDEPENDENT_AMBULATORY_CARE_PROVIDER_SITE_OTHER): Payer: Medicaid Other | Admitting: Urology

## 2022-12-30 VITALS — BP 120/81 | HR 82

## 2022-12-30 DIAGNOSIS — Z7689 Persons encountering health services in other specified circumstances: Secondary | ICD-10-CM | POA: Diagnosis not present

## 2022-12-30 DIAGNOSIS — R319 Hematuria, unspecified: Secondary | ICD-10-CM | POA: Diagnosis not present

## 2022-12-30 DIAGNOSIS — N361 Urethral diverticulum: Secondary | ICD-10-CM

## 2022-12-30 DIAGNOSIS — R339 Retention of urine, unspecified: Secondary | ICD-10-CM | POA: Diagnosis not present

## 2022-12-30 LAB — URINALYSIS, COMPLETE
Bilirubin, UA: NEGATIVE
Glucose, UA: NEGATIVE
Ketones, UA: NEGATIVE
Nitrite, UA: NEGATIVE
Protein,UA: NEGATIVE
RBC, UA: NEGATIVE
Specific Gravity, UA: >1.03 — ABNORMAL HIGH (ref 1.005–1.030)
Urobilinogen, Ur: 0.2 mg/dL (ref 0.2–1.0)
pH, UA: 5 (ref 5.0–7.5)

## 2022-12-30 LAB — MICROSCOPIC EXAMINATION

## 2022-12-30 LAB — BLADDER SCAN AMB NON-IMAGING: Scan Result: 99

## 2023-01-03 ENCOUNTER — Encounter: Payer: Self-pay | Admitting: Urology

## 2023-01-03 DIAGNOSIS — R0609 Other forms of dyspnea: Secondary | ICD-10-CM | POA: Insufficient documentation

## 2023-01-03 DIAGNOSIS — R0602 Shortness of breath: Secondary | ICD-10-CM | POA: Insufficient documentation

## 2023-01-03 NOTE — Progress Notes (Deleted)
Cardiology Office Note  Date:  01/03/2023   ID:  [name], DOB: 1965-12-07, MRN: [id number]  PCP:  Margarita Mail, DO   No chief complaint on file.   HPI:  [name] is a 57 y.o. female with a history of: Essential hypertension Esophageal dysphagia Obesity (BMI 30.0-34.9) Eosinophilic esophagitis Hematuria Chronic nausea Depression, major, recurrent, moderate (HCC) Adult hypothyroidism Previously seen in cardiology clinic March 2024 chest pain, abnormal EKG Who presents By referral from  Dr.Elizabeth Caralee Ates For shortness of breath   seen by primary care February 2024  tried inhalers for shortness of breath' Breztri, oral steroids without effect on shortness of breath. Chest x-ray negative, labs without abnormalities. Short of breath with exertion and at rest with some orthopnea, PND but BNP negative.     echocardiogram May 2020, normal study     levothyroxine 75 mcg and meloxicam.  Her CT scan from 03/24/2016 shows no plaque build-up. She has never had an echocardiogram.   She stays away from sugars and excessive carbohydrates.   CT ABD/pelvis: 03/2017  Total Chol 186/ LDL 114 CR 0.72 Glucose 72  EKG personally reviewed by myself on todays visit Shows normal sinus rhythm. 76 bpm. Nonspecific T wave Abn III, Avf, V1-V3. Borderline ECG   PMH:   has a past medical history of Anxiety, Arthritis, Cancer, Cognitive impairment, mild, so stated, Depression, Diverticulosis, Eosinophilic esophagitis (09/03/2017), Esophageal dysphagia, Family history of breast cancer (05/2021), Gallstones, GERD (gastroesophageal reflux disease), History of cervical cancer, cervical cancer (01/30/2016), Hypertension, Hypothyroidism, Sleep apnea, Status post partial hysterectomy, and Vaginal inclusion cyst.  PSH:    Past Surgical History:  Procedure Laterality Date   ABDOMINAL HYSTERECTOMY     partial hysterectomy   BREAST BIOPSY Bilateral 01/01/2016   Fibroadenoma   COLONOSCOPY WITH  PROPOFOL N/A 06/27/2015   Procedure: COLONOSCOPY WITH PROPOFOL;  Surgeon: Elnita Maxwell, MD;  Location: [facility] ENDOSCOPY;  Service: Endoscopy;  Laterality: N/A;   COLONOSCOPY WITH PROPOFOL N/A 09/23/2017   Procedure: COLONOSCOPY WITH PROPOFOL;  Surgeon: Wyline Mood, MD;  Location: [facility] ENDOSCOPY;  Service: Gastroenterology;  Laterality: N/A;   COLONOSCOPY WITH PROPOFOL N/A 06/09/2021   Procedure: COLONOSCOPY WITH PROPOFOL;  Surgeon: Toney Reil, MD;  Location: [facility] ENDOSCOPY;  Service: Gastroenterology;  Laterality: N/A;   ESOPHAGOGASTRODUODENOSCOPY (EGD) WITH PROPOFOL N/A 07/22/2017   Procedure: ESOPHAGOGASTRODUODENOSCOPY (EGD) WITH PROPOFOL;  Surgeon: Wyline Mood, MD;  Location: [facility] ENDOSCOPY;  Service: Gastroenterology;  Laterality: N/A;   ESOPHAGOGASTRODUODENOSCOPY (EGD) WITH PROPOFOL N/A 03/31/2018   Procedure: ESOPHAGOGASTRODUODENOSCOPY (EGD) WITH PROPOFOL;  Surgeon: Toney Reil, MD;  Location: [facility] ENDOSCOPY;  Service: Gastroenterology;  Laterality: N/A;   ESOPHAGOGASTRODUODENOSCOPY (EGD) WITH PROPOFOL N/A 08/25/2021   Procedure: ESOPHAGOGASTRODUODENOSCOPY (EGD) WITH PROPOFOL;  Surgeon: Toney Reil, MD;  Location: [facility] ENDOSCOPY;  Service: Gastroenterology;  Laterality: N/A;   ESOPHAGOGASTRODUODENOSCOPY (EGD) WITH PROPOFOL N/A 11/20/2022   Procedure: ESOPHAGOGASTRODUODENOSCOPY (EGD) WITH PROPOFOL;  Surgeon: Toney Reil, MD;  Location: [facility] ENDOSCOPY;  Service: Gastroenterology;  Laterality: N/A;   KNEE ARTHROSCOPY Right 07/22/2016   Procedure: ARTHROSCOPY KNEE debridement microfracture;  Surgeon: Erin Sons, MD;  Location: [facility] ORS;  Service: Orthopedics;  Laterality: Right;   KNEE ARTHROSCOPY WITH MEDIAL MENISECTOMY Left 11/10/2017   Procedure: KNEE ARTHROSCOPY WITH PARTIAL MEDIAL MENISECTOMY&patella femoral debridement, & ablation;  Surgeon: Erin Sons, MD;  Location: [facility] ORS;  Service: Orthopedics;  Laterality: Left;   TOTAL KNEE ARTHROPLASTY Right  11/24/2021   Procedure: TOTAL KNEE ARTHROPLASTY;  Surgeon: Lyndle Herrlich, MD;  Location:  [facility] ORS;  Service: Orthopedics;  Laterality: Right;   TUBAL LIGATION      Current Outpatient Medications  Medication Sig Dispense Refill   estradiol (ESTRACE) 0.5 MG tablet Take 1 tablet (0.5 mg total) by mouth daily. 90 tablet 3   FLUoxetine (PROZAC) 20 MG capsule TAKE 1 CAPSULE BY MOUTH ONCE DAILY 90 capsule 1   levothyroxine (SYNTHROID) 75 MCG tablet Take 1 tablet (75 mcg total) by mouth daily before breakfast. 90 tablet 1   lisinopril (ZESTRIL) 5 MG tablet Take 1 tablet (5 mg total) by mouth daily. 30 tablet 1   meloxicam (MOBIC) 15 MG tablet Take 15 mg by mouth daily.     omeprazole (PRILOSEC) 40 MG capsule Take 1 capsule (40 mg total) by mouth 2 (two) times daily before a meal. 60 capsule 2   No current facility-administered medications for this visit.     ALLERGIES:   Prednisone, Methylprednisolone, and Percocet [oxycodone-acetaminophen]   SOCIAL HISTORY:  The patient  reports that she has never smoked. She has never been exposed to tobacco smoke. She has never used smokeless tobacco. She reports that she does not currently use alcohol. She reports that she does not currently use drugs.   FAMILY HISTORY:   family history includes Breast cancer in her cousin, maternal aunt, and another family member; Breast cancer (age of onset: 36) in her maternal grandmother; Cancer in her paternal grandmother; Cirrhosis in her cousin; Diabetes in her mother; Lung cancer (age of onset: 68) in her father.    REVIEW OF SYSTEMS: Review of Systems  Constitutional: Negative.   Eyes: Negative.   Respiratory: Negative.    Cardiovascular:  Positive for chest pain.  Gastrointestinal: Negative.   Genitourinary: Negative.   Musculoskeletal: Negative.   Neurological: Negative.   Psychiatric/Behavioral: Negative.    All other systems reviewed and are negative.    PHYSICAL EXAM: VS:  LMP 05/09/1993  (Approximate)  , BMI There is no height or weight on file to calculate BMI.  GEN: Well nourished, well developed, in no acute distress HEENT: normal Neck: no JVD, carotid bruits, or masses Cardiac: RRR; no murmurs, rubs, or gallops,no edema Respiratory:  clear to auscultation bilaterally, normal work of breathing GI: soft, nontender, nondistended, + BS MS: no deformity or atrophy Skin: warm and dry, no rash Neuro:  Strength and sensation are intact Psych: euthymic mood, full affect    RECENT LABS: 10/05/2022: ALT 25; Brain Natriuretic Peptide 34; BUN 9; Creat 0.71; Hemoglobin 13.5; Platelets 254; Potassium 3.9; Sodium 139; TSH 4.48    LIPID PANEL: Lab Results  Component Value Date   CHOL 213 (H) 05/20/2022   HDL 78 05/20/2022   LDLCALC 112 (H) 05/20/2022   TRIG 119 05/20/2022      WEIGHT: Wt Readings from Last 3 Encounters:  12/22/22 167 lb (75.8 kg)  12/14/22 175 lb 3.2 oz (79.5 kg)  11/20/22 171 lb (77.6 kg)       ASSESSMENT AND PLAN:  Chest pain, unspecified type -  Some atypical features, presenting at rest, not with exertion   very active at baseline without symptoms Abnormal EKG, nonspecific T wave abnormality noted Likely a benign finding.  Echocardiogram has been ordered to evaluate cardiac function  Abnormal EKG - Plan: ECHOCARDIOGRAM COMPLETE As above, nonspecific finding Possibly exacerbated by lead placement Echocardiogram pending Non-smoker, no diabetes, cholesterol reasonable  Disposition:   F/U  PRN  Patient was seen in consultation for Dr. Sherie Don and will be referred back to her office  for ongoing care of the issues detailed above  Total encounter time more than 60 minutes. Greater than 50% was spent in counseling and coordination of care with the patient.    No orders of the defined types were placed in this encounter.   I, Jesus Reyes am acting as a scribe for Julien Nordmann, M.DNeurosurgeonPh.D.  I, Julien Nordmann, M.D. Ph.D., have reviewed the  above documentation for accuracy and completeness, and I agree with the above.   Signed, Dossie Arbour, M.D., Ph.D. 01/03/2023  [facility] [place], [place] [phone]

## 2023-01-04 ENCOUNTER — Ambulatory Visit: Payer: Medicaid Other | Admitting: Cardiovascular Disease

## 2023-01-04 ENCOUNTER — Telehealth: Payer: Self-pay | Admitting: Family Medicine

## 2023-01-04 DIAGNOSIS — R0602 Shortness of breath: Secondary | ICD-10-CM

## 2023-01-04 DIAGNOSIS — I10 Essential (primary) hypertension: Secondary | ICD-10-CM

## 2023-01-04 DIAGNOSIS — G4733 Obstructive sleep apnea (adult) (pediatric): Secondary | ICD-10-CM

## 2023-01-04 LAB — CULTURE, URINE COMPREHENSIVE

## 2023-01-04 MED ORDER — FLUCONAZOLE 150 MG PO TABS: 150.0000 mg | ORAL_TABLET | Freq: Once | ORAL | 0 refills | Status: AC

## 2023-01-04 NOTE — Telephone Encounter (Signed)
Notified patient as instructed, Medication sent

## 2023-01-04 NOTE — Telephone Encounter (Signed)
-----   Message from Harle Battiest, PA-C sent at 01/04/2023 12:30 PM EDT ----- Please let Kristina Reed know that her urine culture grew out yeast.  I would like for her to take 1 Diflucan 150 mg for this.

## 2023-01-06 ENCOUNTER — Telehealth: Payer: Self-pay

## 2023-01-06 DIAGNOSIS — Z7689 Persons encountering health services in other specified circumstances: Secondary | ICD-10-CM | POA: Diagnosis not present

## 2023-01-06 NOTE — Telephone Encounter (Signed)
Pt calling requesting her Esophageal manometry from [facility] on 12/18/22. Please advise.

## 2023-01-07 ENCOUNTER — Other Ambulatory Visit: Payer: Self-pay | Admitting: Gastroenterology

## 2023-01-07 MED ORDER — AMITRIPTYLINE HCL 25 MG PO TABS: 25.0000 mg | ORAL_TABLET | Freq: Every day | ORAL | 0 refills | Status: DC

## 2023-01-07 NOTE — Telephone Encounter (Signed)
Reviewed esophageal manometry results.  She has spasm in the lower esophagus which leads to episodes of food getting stuck.  Recommend trial of low-dose amitriptyline 25 mg at bedtime which might help relax her lower esophagus    Impression:            - Manometry results are consistent with hypotensive                         upper esophageal sphincter.                         - Elevated IRP in secondary position with normal                         primary position IRP is not likely of clinical                         significance.                         - Manometry results are consistent with distal                         esophageal spasm.    Lannette Donath, MD

## 2023-01-07 NOTE — Telephone Encounter (Signed)
Called and left a message for call back  

## 2023-01-07 NOTE — Telephone Encounter (Signed)
Is patient supposed to discontinue fluoxetine or continue both? thank you!

## 2023-01-07 NOTE — Addendum Note (Signed)
Addended by: Radene Knee L on: 01/07/2023 02:16 PM   Modules accepted: Orders

## 2023-01-07 NOTE — Telephone Encounter (Signed)
Patient verbalized understanding of results and sent medication to pharmacy. She states she will pick it up tomorrow

## 2023-01-11 NOTE — Progress Notes (Unsigned)
Established Patient Office Visit  Subjective   Patient ID: Kristina Reed, female    DOB: Oct 20, 1965  Age: 57 y.o. MRN: [id number]  No chief complaint on file.   HPI  Patient is here for follow up on chronic medical conditions.  Hypertension: -Medications: started on Lisinopril 5 mg -Checking BP at home (average): 135-150/85-100 -Denies any SOB, CP, vision changes, LE edema or symptoms of hypotension. Does endorse occasional headaches which are new for her.  HLD: -Medications: Nothing -Last lipid panel: Lipid Panel     Component Value Date/Time   CHOL 213 (H) 05/20/2022 1110   TRIG 119 05/20/2022 1110   HDL 78 05/20/2022 1110   CHOLHDL 2.7 05/20/2022 1110   VLDL 26 09/02/2016 0903   LDLCALC 112 (H) 05/20/2022 1110    The 10-year ASCVD risk score (Arnett DK, et al., 2019) is: 1.6%   Values used to calculate the score:     Age: 42 years     Sex: Female     Is Non-Hispanic African American: No     Diabetic: No     Tobacco smoker: No     Systolic Blood Pressure: 120 mmHg     Is BP treated: Yes     HDL Cholesterol: 78 mg/dL     Total Cholesterol: 213 mg/dL   Hypothyroidism: -Medications: Levothyroxine 75 mcg -Patient is compliant with the above medication (s) at the above dose and reports no medication side effects.  -Denies weight changes, cold./heat intolerance, skin changes, anxiety/palpitations  -Last TSH: 1/24 4.48  GERD: -Currently on omeprazole 40 mg daily, just increased by GI -Was seen by GI and had EGD 11/20/22 which was negative, planning on following with with GI in [place] later this week  Elevated LFTs: -Labs from 9/23 showing AST 46, ALT 55, improved on labs from 1/24 -CT abdomen and pelvis from 04/07/2022 showing fatty liver. -Patient has been working on diet and losing weight.  MDD: -Mood status: {Blank single:19197::"controlled","uncontrolled","better","worse","exacerbated","stable"} -Current treatment: Prozac 20 mg -Satisfied with current  treatment?: {Blank single:19197::"yes","no"} -Symptom severity: {Blank single:19197::"mild","moderate","severe"}  -Duration of current treatment : {Blank single:19197::"chronic","months","years"} -Side effects: {Blank single:19197::"yes","no"} Medication compliance: {Blank single:19197::"excellent compliance","good compliance","fair compliance","poor compliance"}     12/14/2022    7:56 AM 10/28/2022    8:54 AM 09/29/2022   10:43 AM 09/25/2022   11:46 AM 08/19/2022   11:53 AM  Depression screen PHQ 2/9  Decreased Interest 0 0 0 0 0  Down, Depressed, Hopeless 2 0 0 0 0  PHQ - 2 Score 2 0 0 0 0  Altered sleeping 1 0 0 0 0  Tired, decreased energy 1 0 0 0 0  Change in appetite 0 0 0 0 0  Feeling bad or failure about yourself  0 0 0 0 0  Trouble concentrating 0 0 0 0 0  Moving slowly or fidgety/restless 0 0 0 0 0  Suicidal thoughts 0 0 0 0 0  PHQ-9 Score 4 0 0 0 0  Difficult doing work/chores Somewhat difficult Not difficult at all Not difficult at all Not difficult at all Not difficult at all    Review of Systems  Constitutional:  Negative for chills and fever.  Eyes:  Negative for blurred vision.  Respiratory:  Negative for shortness of breath.   Cardiovascular:  Negative for chest pain.  Neurological:  Positive for headaches. Negative for dizziness.      Objective:     LMP 05/09/1993 (Approximate)  BP Readings from Last 3 Encounters:  12/30/22 120/81  12/22/22 110/70  12/14/22 (!) 134/94   Wt Readings from Last 3 Encounters:  12/22/22 167 lb (75.8 kg)  12/14/22 175 lb 3.2 oz (79.5 kg)  11/20/22 171 lb (77.6 kg)      Physical Exam Constitutional:      Appearance: Normal appearance.  HENT:     Head: Normocephalic and atraumatic.  Eyes:     Conjunctiva/sclera: Conjunctivae normal.  Cardiovascular:     Rate and Rhythm: Normal rate and regular rhythm.  Pulmonary:     Effort: Pulmonary effort is normal.     Breath sounds: Normal breath sounds.  Abdominal:      Comments: Tenderness to palpation in the LLQ  Skin:    General: Skin is warm and dry.  Neurological:     General: No focal deficit present.     Mental Status: She is alert. Mental status is at baseline.  Psychiatric:        Mood and Affect: Mood normal.        Behavior: Behavior normal.     No results found for any visits on 01/13/23.  Last CBC Lab Results  Component Value Date   WBC 7.0 10/05/2022   HGB 13.5 10/05/2022   HCT 40.2 10/05/2022   MCV 84.3 10/05/2022   MCH 28.3 10/05/2022   RDW 12.3 10/05/2022   PLT 254 10/05/2022   Last metabolic panel Lab Results  Component Value Date   GLUCOSE 81 10/05/2022   NA 139 10/05/2022   K 3.9 10/05/2022   CL 102 10/05/2022   CO2 28 10/05/2022   BUN 9 10/05/2022   CREATININE 0.71 10/05/2022   GFRNONAA >60 04/08/2022   CALCIUM 9.3 10/05/2022   PROT 7.2 10/05/2022   ALBUMIN 4.0 04/08/2022   LABGLOB 2.9 01/13/2016   AGRATIO 1.6 01/13/2016   BILITOT 0.3 10/05/2022   ALKPHOS 87 04/08/2022   AST 19 10/05/2022   ALT 25 10/05/2022   ANIONGAP 10 04/08/2022   Last lipids Lab Results  Component Value Date   CHOL 213 (H) 05/20/2022   HDL 78 05/20/2022   LDLCALC 112 (H) 05/20/2022   TRIG 119 05/20/2022   CHOLHDL 2.7 05/20/2022   Last hemoglobin A1c Lab Results  Component Value Date   HGBA1C 5.8 (H) 03/03/2022   Last thyroid functions Lab Results  Component Value Date   TSH 4.48 10/05/2022   Last vitamin D No results found for: "25OHVITD2", "25OHVITD3", "VD25OH" Last vitamin B12 and Folate No results found for: "VITAMINB12", "FOLATE"    The 10-year ASCVD risk score (Arnett DK, et al., 2019) is: 1.6%    Assessment & Plan:   1. Hypertension, unspecified type: Blood pressure borderline elevated today, higher at home with occasional headaches. Will start Lisinopril 5 mg daily, have her continue to monitor blood pressure at home and follow up here in 1 month to recheck.   - lisinopril (ZESTRIL) 5 MG tablet; Take 1  tablet (5 mg total) by mouth daily.  Dispense: 30 tablet; Refill: 1  2. Adult hypothyroidism: Stable, doing well on Levothyroxine 75 mcg dose.   3. Gastroesophageal reflux disease, unspecified whether esophagitis present: Currently on Prilosec 20 mg having some globus sensation, EGD reviewed from 11/20/22 which was normal, following up with GI in [place] later this week.   No follow-ups on file.    Margarita Mail, DO

## 2023-01-13 ENCOUNTER — Ambulatory Visit (INDEPENDENT_AMBULATORY_CARE_PROVIDER_SITE_OTHER): Payer: Medicaid Other | Admitting: Internal Medicine

## 2023-01-13 ENCOUNTER — Encounter: Payer: Self-pay | Admitting: Internal Medicine

## 2023-01-13 VITALS — BP 104/62 | HR 93 | Temp 98.0°F | Resp 16 | Ht 61.0 in | Wt 171.9 lb

## 2023-01-13 DIAGNOSIS — I10 Essential (primary) hypertension: Secondary | ICD-10-CM | POA: Diagnosis not present

## 2023-01-13 DIAGNOSIS — Z419 Encounter for procedure for purposes other than remedying health state, unspecified: Secondary | ICD-10-CM | POA: Diagnosis not present

## 2023-01-13 DIAGNOSIS — Z7689 Persons encountering health services in other specified circumstances: Secondary | ICD-10-CM | POA: Diagnosis not present

## 2023-01-18 ENCOUNTER — Telehealth: Payer: Self-pay | Admitting: Internal Medicine

## 2023-01-18 NOTE — Telephone Encounter (Signed)
Pt is calling to ask will she be referred to neurology based on the memory test. CB- [phone]

## 2023-01-19 DIAGNOSIS — G3184 Mild cognitive impairment, so stated: Secondary | ICD-10-CM | POA: Diagnosis not present

## 2023-01-19 DIAGNOSIS — E669 Obesity, unspecified: Secondary | ICD-10-CM | POA: Diagnosis not present

## 2023-01-19 DIAGNOSIS — M1990 Unspecified osteoarthritis, unspecified site: Secondary | ICD-10-CM | POA: Diagnosis not present

## 2023-01-19 DIAGNOSIS — E039 Hypothyroidism, unspecified: Secondary | ICD-10-CM | POA: Diagnosis not present

## 2023-01-19 DIAGNOSIS — K219 Gastro-esophageal reflux disease without esophagitis: Secondary | ICD-10-CM | POA: Diagnosis not present

## 2023-01-19 DIAGNOSIS — F419 Anxiety disorder, unspecified: Secondary | ICD-10-CM | POA: Diagnosis not present

## 2023-01-19 DIAGNOSIS — Z5986 Financial insecurity: Secondary | ICD-10-CM | POA: Diagnosis not present

## 2023-01-19 DIAGNOSIS — F32A Depression, unspecified: Secondary | ICD-10-CM | POA: Diagnosis not present

## 2023-01-19 DIAGNOSIS — I10 Essential (primary) hypertension: Secondary | ICD-10-CM | POA: Diagnosis not present

## 2023-01-19 DIAGNOSIS — M62838 Other muscle spasm: Secondary | ICD-10-CM | POA: Diagnosis not present

## 2023-01-19 DIAGNOSIS — R32 Unspecified urinary incontinence: Secondary | ICD-10-CM | POA: Diagnosis not present

## 2023-01-20 ENCOUNTER — Ambulatory Visit: Payer: Medicaid Other | Attending: Cardiovascular Disease | Admitting: Internal Medicine

## 2023-01-20 ENCOUNTER — Other Ambulatory Visit
Admission: RE | Admit: 2023-01-20 | Discharge: 2023-01-20 | Disposition: A | Discharge status: Home / Self Care | Payer: Medicaid Other | Source: Ambulatory Visit | Attending: Internal Medicine | Admitting: Internal Medicine

## 2023-01-20 ENCOUNTER — Encounter: Payer: Self-pay | Admitting: Internal Medicine

## 2023-01-20 VITALS — BP 130/80 | HR 74 | Ht 61.5 in | Wt 174.4 lb

## 2023-01-20 DIAGNOSIS — Z7689 Persons encountering health services in other specified circumstances: Secondary | ICD-10-CM | POA: Diagnosis not present

## 2023-01-20 DIAGNOSIS — R072 Precordial pain: Secondary | ICD-10-CM | POA: Insufficient documentation

## 2023-01-20 DIAGNOSIS — I10 Essential (primary) hypertension: Secondary | ICD-10-CM

## 2023-01-20 DIAGNOSIS — R0602 Shortness of breath: Secondary | ICD-10-CM | POA: Diagnosis not present

## 2023-01-20 LAB — BASIC METABOLIC PANEL
Anion gap: 6 (ref 5–15)
BUN: 12 mg/dL (ref 6–20)
CO2: 29 mmol/L (ref 22–32)
Calcium: 8.9 mg/dL (ref 8.9–10.3)
Chloride: 104 mmol/L (ref 98–111)
Creatinine, Ser: 0.71 mg/dL (ref 0.44–1.00)
GFR, Estimated: >60 mL/min (ref >60)
Glucose, Bld: 71 mg/dL (ref 70–99)
Potassium: 3.7 mmol/L (ref 3.5–5.1)
Sodium: 139 mmol/L (ref 135–145)

## 2023-01-20 MED ORDER — METOPROLOL TARTRATE 100 MG PO TABS: ORAL_TABLET | ORAL | 0 refills | Status: DC

## 2023-01-20 NOTE — Patient Instructions (Addendum)
Medication Instructions:  Your physician recommends the following medication changes.  START TAKING: Aspirin 81 mg by mouth daily   *If you need a refill on your cardiac medications before your next appointment, please call your pharmacy*   Lab Work: Your provider would like for you to have following labs drawn: (BMP).   Please go to the [facility] entrance and check in at the front desk.  You do not need an appointment.  They are open from 7am-6 pm.   If you have labs (blood work) drawn today and your tests are completely normal, you will receive your results only by: MyChart Message (if you have MyChart) OR A paper copy in the mail  If you have any lab test that is abnormal or we need to change your treatment, we will call you to review the results.   Testing/Procedures: Your physician has requested that you have an echocardiogram. Echocardiography is a painless test that uses sound waves to create images of your heart. It provides your doctor with information about the size and shape of your heart and how well your heart's chambers and valves are working.   You may receive an ultrasound enhancing agent through an IV if needed to better visualize your heart during the echo. This procedure takes approximately one hour.  There are no restrictions for this procedure.  This will take place at [address] ([facility]) [phone], [place] [address]   Cardiac CT Angiography (CTA), is a special type of CT scan that uses a computer to produce multi-dimensional views of major blood vessels throughout the body. In CT angiography, a contrast material is injected through an IV to help visualize the blood vessels  Please see instructions below  Follow-Up: At [facility], you and your health needs are our priority.  As part of our continuing mission to provide you with exceptional heart care, we have created designated Provider Care Teams.  These Care Teams include  your primary Cardiologist (physician) and Advanced Practice Providers (APPs -  Physician Assistants and Nurse Practitioners) who all work together to provide you with the care you need, when you need it.  We recommend signing up for the patient portal called "MyChart".  Sign up information is provided on this After Visit Summary.  MyChart is used to connect with patients for Virtual Visits (Telemedicine).  Patients are able to view lab/test results, encounter notes, upcoming appointments, etc.  Non-urgent messages can be sent to your provider as well.   To learn more about what you can do with MyChart, go to [email or web address].    Your next appointment:   4-6 week(s)  Provider:   You may see Yvonne Kendall, MD or one of the following Advanced Practice Providers on your designated Care Team:   Nicolasa Ducking, NP Eula Listen, PA-C Cadence Fransico Michael, PA-C Charlsie Quest, NP      Your cardiac CT will be scheduled at one of the below locations:   [facility] [address] [place], [place] [address] [phone]  OR   [facility] [address] [place], [place] [address] [phone]  If scheduled at [facility], please arrive at the [facility] and Children's Entrance (Entrance C2) of [facility] 30 minutes prior to test start time. You can use the FREE valet parking offered at entrance C (encouraged to control the heart rate for the test)  Proceed to the [facility] Radiology Department (first  floor) to check-in and test prep.  All radiology patients and guests should use entrance C2 at [facility], accessed from [facility], even though the hospital's physical address listed is [address].    If scheduled at [facility] or [facility], please arrive 15 mins early for check-in and test prep.   Please follow these  instructions carefully (unless otherwise directed)  On the Night Before the Test: Be sure to Drink plenty of water. Do not consume any caffeinated/decaffeinated beverages or chocolate 12 hours prior to your test. Do not take any antihistamines 12 hours prior to your test.  On the Day of the Test: Drink plenty of water until 1 hour prior to the test. Do not eat any food 1 hour prior to test. You may take your regular medications prior to the test.  Take metoprolol (Lopressor) 100 mg two hours prior to test. FEMALES- please wear underwire-free bra if available, avoid dresses & tight clothing       After the Test: Drink plenty of water. After receiving IV contrast, you may experience a mild flushed feeling. This is normal. On occasion, you may experience a mild rash up to 24 hours after the test. This is not dangerous. If this occurs, you can take Benadryl 25 mg and increase your fluid intake. If you experience trouble breathing, this can be serious. If it is severe call 911 IMMEDIATELY. If it is mild, please call our office. If you take any of these medications: Glipizide/Metformin, Avandament, Glucavance, please do not take 48 hours after completing test unless otherwise instructed.  We will call to schedule your test 2-4 weeks out understanding that some insurance companies will need an authorization prior to the service being performed.   For non-scheduling related questions, please contact the cardiac imaging nurse navigator should you have any questions/concerns: Rockwell Alexandria, Cardiac Imaging Nurse Navigator Larey Brick, Cardiac Imaging Nurse Navigator [facility] Direct Office Dial: [phone]   For scheduling needs, including cancellations and rescheduling, please call [place], [phone].

## 2023-01-20 NOTE — Progress Notes (Signed)
New Outpatient Visit Date: 01/20/2023  Referring Provider: Margarita Mail, DO [address] [place],  [place] [address]  Chief Complaint: Shortness of breath  HPI:  Kristina Reed is a 57 y.o. female who is being seen today for the evaluation of shortness of breath at the request of Dr. Caralee Ates. She has a history of hypertension, hypothyroidism, eosinophilic esophagitis, and cervical cancer.  She was seen once in 2020 by Dr. Mariah Milling at which time she complained of episodic chest pain and palpitations.  Echocardiogram showed LVEF 55-60% with normal wall motion and diastolic parameters.  RV size and function were normal.  No significant abnormalities were seen.  Today, Kristina Reed presents for reevaluation of shortness of breath.  Shortness of breath is transient, usually only lasting a few seconds.  It can happen at rest or with activity, sometimes waking her up at night.  She endorses 1 pillow orthopnea and PND.  She states that this just began a few days ago although reviewing her chart, it looks like this has been present off and on for months or longer.  She also notes occasional chest pressure associated with walking uphill.  This has been stable for the last few months.  She feels like her heart speeds up when she walks uphill but has not had any other palpitations.  She has occasional lightheadedness but has not passed out or fallen.  She denies edema.  --------------------------------------------------------------------------------------------------  Cardiovascular History & Procedures: Cardiovascular Problems: Shortness of breath Chest discomfort  Risk Factors: Hypertension and obesity  Cath/PCI: None  CV Surgery: None  EP Procedures and Devices: None  Non-Invasive Evaluation(s): TTE (01/31/2019): Normal LV size and wall thickness.  LVEF 55-6% with normal wall motion and diastolic function.  Normal RV size and function.  Normal biatrial size.  No significant  valvular abnormalities.  Recent CV Pertinent Labs: Lab Results  Component Value Date   CHOL 213 (H) 05/20/2022   HDL 78 05/20/2022   LDLCALC 112 (H) 05/20/2022   TRIG 119 05/20/2022   CHOLHDL 2.7 05/20/2022   INR 0.9 04/13/2012   BNP 34 10/05/2022   K 3.9 10/05/2022   K 4.3 12/27/2014   BUN 9 10/05/2022   BUN 7 01/13/2016   BUN 11 12/27/2014   CREATININE 0.71 10/05/2022    --------------------------------------------------------------------------------------------------  Past Medical History:  Diagnosis Date   Anxiety    Arthritis    right knee and right elbow   Cancer (HCC)    Cervical CA with partial hysterectomy.   Cognitive impairment, mild, so stated    Depression    Diverticulosis    Eosinophilic esophagitis 09/03/2017   Biopsy Dec 2018   Esophageal dysphagia    Family history of breast cancer 05/2021   cancer genetic testing letter sent   Gallstones    GERD (gastroesophageal reflux disease)    History of cervical cancer    Hx of cervical cancer 01/30/2016   Hypertension    Hypothyroidism    Sleep apnea    does not use a C-PAP   Status post partial hysterectomy    Due to Cervical CA   Vaginal inclusion cyst     Past Surgical History:  Procedure Laterality Date   ABDOMINAL HYSTERECTOMY     partial hysterectomy   BREAST BIOPSY Bilateral 01/01/2016   Fibroadenoma   COLONOSCOPY WITH PROPOFOL N/A 06/27/2015   Procedure: COLONOSCOPY WITH PROPOFOL;  Surgeon: Elnita Maxwell, MD;  Location: [facility] ENDOSCOPY;  Service: Endoscopy;  Laterality: N/A;  COLONOSCOPY WITH PROPOFOL N/A 09/23/2017   Procedure: COLONOSCOPY WITH PROPOFOL;  Surgeon: Wyline Mood, MD;  Location: [facility] ENDOSCOPY;  Service: Gastroenterology;  Laterality: N/A;   COLONOSCOPY WITH PROPOFOL N/A 06/09/2021   Procedure: COLONOSCOPY WITH PROPOFOL;  Surgeon: Toney Reil, MD;  Location: [facility] ENDOSCOPY;  Service: Gastroenterology;  Laterality: N/A;   ESOPHAGOGASTRODUODENOSCOPY (EGD) WITH  PROPOFOL N/A 07/22/2017   Procedure: ESOPHAGOGASTRODUODENOSCOPY (EGD) WITH PROPOFOL;  Surgeon: Wyline Mood, MD;  Location: [facility] ENDOSCOPY;  Service: Gastroenterology;  Laterality: N/A;   ESOPHAGOGASTRODUODENOSCOPY (EGD) WITH PROPOFOL N/A 03/31/2018   Procedure: ESOPHAGOGASTRODUODENOSCOPY (EGD) WITH PROPOFOL;  Surgeon: Toney Reil, MD;  Location: [facility] ENDOSCOPY;  Service: Gastroenterology;  Laterality: N/A;   ESOPHAGOGASTRODUODENOSCOPY (EGD) WITH PROPOFOL N/A 08/25/2021   Procedure: ESOPHAGOGASTRODUODENOSCOPY (EGD) WITH PROPOFOL;  Surgeon: Toney Reil, MD;  Location: [facility] ENDOSCOPY;  Service: Gastroenterology;  Laterality: N/A;   ESOPHAGOGASTRODUODENOSCOPY (EGD) WITH PROPOFOL N/A 11/20/2022   Procedure: ESOPHAGOGASTRODUODENOSCOPY (EGD) WITH PROPOFOL;  Surgeon: Toney Reil, MD;  Location: [facility] ENDOSCOPY;  Service: Gastroenterology;  Laterality: N/A;   KNEE ARTHROSCOPY Right 07/22/2016   Procedure: ARTHROSCOPY KNEE debridement microfracture;  Surgeon: Erin Sons, MD;  Location: [facility] ORS;  Service: Orthopedics;  Laterality: Right;   KNEE ARTHROSCOPY WITH MEDIAL MENISECTOMY Left 11/10/2017   Procedure: KNEE ARTHROSCOPY WITH PARTIAL MEDIAL MENISECTOMY&patella femoral debridement, & ablation;  Surgeon: Erin Sons, MD;  Location: [facility] ORS;  Service: Orthopedics;  Laterality: Left;   TOTAL KNEE ARTHROPLASTY Right 11/24/2021   Procedure: TOTAL KNEE ARTHROPLASTY;  Surgeon: Lyndle Herrlich, MD;  Location: [facility] ORS;  Service: Orthopedics;  Laterality: Right;   TUBAL LIGATION      Current Meds  Medication Sig   amitriptyline (ELAVIL) 25 MG tablet TAKE 1 TABLET BY MOUTH AT BEDTIME   estradiol (ESTRACE) 0.5 MG tablet Take 1 tablet (0.5 mg total) by mouth daily.   FLUoxetine (PROZAC) 20 MG capsule TAKE 1 CAPSULE BY MOUTH ONCE DAILY   levothyroxine (SYNTHROID) 75 MCG tablet Take 1 tablet (75 mcg total) by mouth daily before breakfast.   lisinopril (ZESTRIL) 5 MG tablet Take 0.5  tablets (2.5 mg total) by mouth 2 (two) times daily.   meloxicam (MOBIC) 15 MG tablet Take 15 mg by mouth daily.   omeprazole (PRILOSEC) 40 MG capsule Take 1 capsule (40 mg total) by mouth 2 (two) times daily before a meal.    Allergies: Prednisone, Methylprednisolone, and Percocet [oxycodone-acetaminophen]  Social History   Tobacco Use   Smoking status: Never    Passive exposure: Never   Smokeless tobacco: Never  Vaping Use   Vaping Use: Never used  Substance Use Topics   Alcohol use: Not Currently   Drug use: Never    Family History  Problem Relation Age of Onset   Diabetes Mother    Arrhythmia Mother        Pacemaker   Lung cancer Father 54   Breast cancer Maternal Grandmother 33   Cancer Paternal Grandmother        cancer?   Breast cancer Maternal Aunt        80s   Cirrhosis Cousin    Breast cancer Cousin    Breast cancer Other        x4    Review of Systems: A 12-system review of systems was performed and was negative except as noted in the HPI.  --------------------------------------------------------------------------------------------------  Physical Exam: BP 130/80 (BP Location: Right Arm, Patient Position: Sitting, Cuff Size: Normal)   Pulse 74   Ht 5' 1.5" (  1.562 m)   Wt 174 lb 6 oz (79.1 kg)   LMP 05/09/1993 (Approximate)   SpO2 98%   BMI 32.41 kg/m   General: NAD. HEENT: No conjunctival pallor or scleral icterus. Neck: Supple without lymphadenopathy, thyromegaly, JVD, or HJR. No carotid bruit. Lungs: Normal work of breathing. Clear to auscultation bilaterally without wheezes or crackles. Heart: Regular rate and rhythm without murmurs, rubs, or gallops. Non-displaced PMI. Abd: Bowel sounds present. Soft, NT/ND without hepatosplenomegaly Ext: No lower extremity edema. Radial, PT, and DP pulses are 2+ bilaterally Skin: Warm and dry without rash. Neuro: CNIII-XII intact. Strength and fine-touch sensation intact in upper and lower extremities  bilaterally. Psych: Normal mood and affect.  EKG: Normal sinus rhythm with borderline LVH.  Otherwise, no significant abnormality.  No significant change from prior tracing on 10/17/2021.  Lab Results  Component Value Date   WBC 7.0 10/05/2022   HGB 13.5 10/05/2022   HCT 40.2 10/05/2022   MCV 84.3 10/05/2022   PLT 254 10/05/2022    Lab Results  Component Value Date   NA 139 10/05/2022   K 3.9 10/05/2022   CL 102 10/05/2022   CO2 28 10/05/2022   BUN 9 10/05/2022   CREATININE 0.71 10/05/2022   GLUCOSE 81 10/05/2022   ALT 25 10/05/2022    Lab Results  Component Value Date   CHOL 213 (H) 05/20/2022   HDL 78 05/20/2022   LDLCALC 112 (H) 05/20/2022   TRIG 119 05/20/2022   CHOLHDL 2.7 05/20/2022     --------------------------------------------------------------------------------------------------  ASSESSMENT AND PLAN: Shortness of breath and precordial pain: Shortness of breath is fleeting and not clearly associated with any particular activity.  Stomach exertional chest discomfort reported, however.  Physical exam today and EKG are unrevealing.  We have agreed to repeat an echocardiogram as well as perform a coronary CTA for further evaluation.  Pending these test, I have recommended that Kristina Reed begin taking aspirin 81 mg daily.  Hypertension: Blood pressure borderline elevated today.  Defer medication change at this time.  Follow-up: Return to clinic in 4 to 6 weeks.  Yvonne Kendall, MD 01/20/2023 8:52 AM

## 2023-01-22 ENCOUNTER — Encounter: Payer: Self-pay | Admitting: Internal Medicine

## 2023-01-22 DIAGNOSIS — R072 Precordial pain: Secondary | ICD-10-CM | POA: Insufficient documentation

## 2023-01-25 ENCOUNTER — Ambulatory Visit (INDEPENDENT_AMBULATORY_CARE_PROVIDER_SITE_OTHER): Payer: Medicaid Other | Admitting: Urology

## 2023-01-25 DIAGNOSIS — N361 Urethral diverticulum: Secondary | ICD-10-CM | POA: Diagnosis not present

## 2023-01-25 DIAGNOSIS — R319 Hematuria, unspecified: Secondary | ICD-10-CM | POA: Diagnosis not present

## 2023-01-25 DIAGNOSIS — Z7689 Persons encountering health services in other specified circumstances: Secondary | ICD-10-CM | POA: Diagnosis not present

## 2023-01-25 LAB — MICROSCOPIC EXAMINATION
Epithelial Cells (non renal): >10 /hpf — AB (ref 0–10)
WBC, UA: >30 /hpf — AB (ref 0–5)

## 2023-01-25 LAB — URINALYSIS, COMPLETE
Bilirubin, UA: NEGATIVE
Glucose, UA: NEGATIVE
Ketones, UA: NEGATIVE
Nitrite, UA: NEGATIVE
Specific Gravity, UA: 1.025 (ref 1.005–1.030)
Urobilinogen, Ur: 0.2 mg/dL (ref 0.2–1.0)
pH, UA: 5.5 (ref 5.0–7.5)

## 2023-01-25 NOTE — Progress Notes (Signed)
01/25/2023 9:21 AM   [name] [birth year] [id number]  Referring provider: Margarita Mail, DO [address] [place],  [place] [address]  No chief complaint on file.   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  In the past had knee surgery and had postoperative retention.  Has had overactive bladder.  CT scan December 2023 demonstrated 2.7 x 2.6 cm urethral diverticulum unchanged from prior exams dating back to 2016.  Noted to have mixed incontinence not wearing pads.  Recent residual was 99 mL  Today Patient has no pain in the vagina and does not feel a bulge.  Her flow was poor and she hesitates and sometimes strains.  She voids every 2-3 hours and gets up twice a night.  She describes some leakage without awareness small-volume.  Sometimes leaks with coughing sneezing and with urgency but does not wear a pad  Gets 1 or 2 bladder infections a year or less but was nonspecific on symptoms.  Has had a kidney stone  No bladder surgery.  She has had a hysterectomy.  No neurologic issues  On pelvic examination she had mild suburethral swelling that was diffuse.  She may have been a little bit tender.  Tissues were well estrogenized.  She had mild hypermobility the bladder neck and negative cough test   PMH: Past Medical History:  Diagnosis Date   Anxiety    Arthritis    right knee and right elbow   Cancer (HCC)    Cervical CA with partial hysterectomy.   Cognitive impairment, mild, so stated    Depression    Diverticulosis    Eosinophilic esophagitis 09/03/2017   Biopsy Dec 2018   Esophageal dysphagia    Family history of breast cancer 05/2021   cancer genetic testing letter sent   Gallstones    GERD (gastroesophageal reflux disease)    History of cervical cancer    Hx of cervical cancer 01/30/2016   Hypertension    Hypothyroidism    Sleep apnea    does not use a C-PAP   Status post partial hysterectomy    Due to Cervical  CA   Vaginal inclusion cyst     Surgical History: Past Surgical History:  Procedure Laterality Date   ABDOMINAL HYSTERECTOMY     partial hysterectomy   BREAST BIOPSY Bilateral 01/01/2016   Fibroadenoma   COLONOSCOPY WITH PROPOFOL N/A 06/27/2015   Procedure: COLONOSCOPY WITH PROPOFOL;  Surgeon: Elnita Maxwell, MD;  Location[facility] ENDOSCOPY;  Service: Endoscopy;  Laterality: N/A;   COLONOSCOPY WITH PROPOFOL N/A 09/23/2017   Procedure: COLONOSCOPY WITH PROPOFOL;  Surgeon: Wyline Mood, MD;  Location: [facility] ENDOSCOPY;  Service: Gastroenterology;  Laterality: N/A;   COLONOSCOPY WITH PROPOFOL N/A 06/09/2021   Procedure: COLONOSCOPY WITH PROPOFOL;  Surgeon: Toney Reil, MD;  Location: [facility] ENDOSCOPY;  Service: Gastroenterology;  Laterality: N/A;   ESOPHAGOGASTRODUODENOSCOPY (EGD) WITH PROPOFOL N/A 07/22/2017   Procedure: ESOPHAGOGASTRODUODENOSCOPY (EGD) WITH PROPOFOL;  Surgeon: Wyline Mood, MD;  Location: [facility] ENDOSCOPY;  Service: Gastroenterology;  Laterality: N/A;   ESOPHAGOGASTRODUODENOSCOPY (EGD) WITH PROPOFOL N/A 03/31/2018   Procedure: ESOPHAGOGASTRODUODENOSCOPY (EGD) WITH PROPOFOL;  Surgeon: Toney Reil, MD;  Location: [facility] ENDOSCOPY;  Service: Gastroenterology;  Laterality: N/A;   ESOPHAGOGASTRODUODENOSCOPY (EGD) WITH PROPOFOL N/A 08/25/2021   Procedure: ESOPHAGOGASTRODUODENOSCOPY (EGD) WITH PROPOFOL;  Surgeon: Toney Reil, MD;  Location: [facility] ENDOSCOPY;  Service: Gastroenterology;  Laterality: N/A;   ESOPHAGOGASTRODUODENOSCOPY (EGD) WITH PROPOFOL N/A 11/20/2022   Procedure: ESOPHAGOGASTRODUODENOSCOPY (EGD) WITH  PROPOFOL;  Surgeon: Toney Reil, MD;  Location: [facility] ENDOSCOPY;  Service: Gastroenterology;  Laterality: N/A;   KNEE ARTHROSCOPY Right 07/22/2016   Procedure: ARTHROSCOPY KNEE debridement microfracture;  Surgeon: Erin Sons, MD;  Location: [facility] ORS;  Service: Orthopedics;  Laterality: Right;   KNEE ARTHROSCOPY WITH MEDIAL MENISECTOMY Left 11/10/2017    Procedure: KNEE ARTHROSCOPY WITH PARTIAL MEDIAL MENISECTOMY&patella femoral debridement, & ablation;  Surgeon: Erin Sons, MD;  Location: [facility] ORS;  Service: Orthopedics;  Laterality: Left;   TOTAL KNEE ARTHROPLASTY Right 11/24/2021   Procedure: TOTAL KNEE ARTHROPLASTY;  Surgeon: Lyndle Herrlich, MD;  Location: [facility] ORS;  Service: Orthopedics;  Laterality: Right;   TUBAL LIGATION      Home Medications:  Allergies as of 01/25/2023       Reactions   Prednisone Nausea And Vomiting   Methylprednisolone Palpitations   Increased heart rate   Percocet [oxycodone-acetaminophen] Palpitations        Medication List        Accurate as of Jan 25, 2023  9:21 AM. If you have any questions, ask your nurse or doctor.          amitriptyline 25 MG tablet Commonly known as: ELAVIL TAKE 1 TABLET BY MOUTH AT BEDTIME   estradiol 0.5 MG tablet Commonly known as: Estrace Take 1 tablet (0.5 mg total) by mouth daily.   FLUoxetine 20 MG capsule Commonly known as: PROZAC TAKE 1 CAPSULE BY MOUTH ONCE DAILY   levothyroxine 75 MCG tablet Commonly known as: SYNTHROID Take 1 tablet (75 mcg total) by mouth daily before breakfast.   lisinopril 5 MG tablet Commonly known as: ZESTRIL Take 0.5 tablets (2.5 mg total) by mouth 2 (two) times daily.   meloxicam 15 MG tablet Commonly known as: MOBIC Take 15 mg by mouth daily.   metoprolol tartrate 100 MG tablet Commonly known as: LOPRESSOR TAKE 1 TABLET 2 HR PRIOR TO CARDIAC PROCEDURE   omeprazole 40 MG capsule Commonly known as: PRILOSEC Take 1 capsule (40 mg total) by mouth 2 (two) times daily before a meal.        Allergies:  Allergies  Allergen Reactions   Prednisone Nausea And Vomiting   Methylprednisolone Palpitations    Increased heart rate   Percocet [Oxycodone-Acetaminophen] Palpitations    Family History: Family History  Problem Relation Age of Onset   Diabetes Mother    Arrhythmia Mother        Pacemaker   Lung  cancer Father 67   Breast cancer Maternal Grandmother 59   Cancer Paternal Grandmother        cancer?   Breast cancer Maternal Aunt        61s   Cirrhosis Cousin    Breast cancer Cousin    Breast cancer Other        x4    Social History:  reports that she has never smoked. She has never been exposed to tobacco smoke. She has never used smokeless tobacco. She reports that she does not currently use alcohol. She reports that she does not use drugs.  ROS:                                        Physical Exam: LMP 05/09/1993 (Approximate)   Constitutional:  Alert and oriented, No acute distress. HEENT: [place] AT, moist mucus membranes.  Trachea midline, no masses.  Laboratory Data: Lab Results  Component  Value Date   WBC 7.0 10/05/2022   HGB 13.5 10/05/2022   HCT 40.2 10/05/2022   MCV 84.3 10/05/2022   PLT 254 10/05/2022    Lab Results  Component Value Date   CREATININE 0.71 01/20/2023    No results found for: "PSA"  No results found for: "TESTOSTERONE"  Lab Results  Component Value Date   HGBA1C 5.8 (H) 03/03/2022    Urinalysis    Component Value Date/Time   COLORURINE YELLOW 10/17/2021 0935   APPEARANCEUR Clear 12/30/2022 1301   LABSPEC 1.025 10/17/2021 0935   LABSPEC 1.019 08/29/2014 1506   PHURINE 5.0 10/17/2021 0935   GLUCOSEU Negative 12/30/2022 1301   GLUCOSEU Negative 08/29/2014 1506   HGBUR NEGATIVE 10/17/2021 0935   BILIRUBINUR Negative 12/30/2022 1301   BILIRUBINUR Negative 08/29/2014 1506   KETONESUR NEGATIVE 10/17/2021 0935   PROTEINUR Negative 12/30/2022 1301   PROTEINUR NEGATIVE 10/17/2021 0935   UROBILINOGEN 0.2 04/02/2022 0802   NITRITE Negative 12/30/2022 1301   NITRITE NEGATIVE 10/17/2021 0935   LEUKOCYTESUR Trace (A) 12/30/2022 1301   LEUKOCYTESUR TRACE (A) 10/17/2021 0935   LEUKOCYTESUR Negative 08/29/2014 1506    Pertinent Imaging: Urine reviewed and sent for culture.  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  Natural history discussed.  Rare risk of carcinoma discussed.  Surgery discussed.  Patient understands that her slow flow probably is not due to the diverticulum but it could be.  The role of urodynamics and future cystoscopy discussed.  Reasons for referral to Dr. Florian Buff in [place] discussed.  Patient said driving to [facility] and [facility] are difficult for her willing to go.  1. Hematuria, unspecified type  - Urinalysis, Complete   No follow-ups on file.  Martina Sinner, MD  [facility] [address] [place], [place] [address] [phone]

## 2023-01-27 LAB — CULTURE, URINE COMPREHENSIVE

## 2023-01-28 LAB — CULTURE, URINE COMPREHENSIVE

## 2023-01-29 ENCOUNTER — Telehealth: Payer: Self-pay

## 2023-01-29 LAB — CULTURE, URINE COMPREHENSIVE

## 2023-01-29 MED ORDER — NITROFURANTOIN MACROCRYSTAL 100 MG PO CAPS: 100.0000 mg | ORAL_CAPSULE | Freq: Two times a day (BID) | ORAL | 0 refills | Status: DC

## 2023-01-29 NOTE — Telephone Encounter (Signed)
-----   Message from Alfredo Martinez, MD sent at 01/29/2023  2:37 PM EDT ----- Macrodantin 100 mg twice a day for 7 days ----- Message ----- From: Consuella Lose, CMA Sent: 01/29/2023   9:37 AM EDT To: Alfredo Martinez, MD   ----- Message ----- From: Interface, [facility] Lab Results In Sent: 01/25/2023  11:36 AM EDT To: Jennette Kettle Clinical

## 2023-01-29 NOTE — Telephone Encounter (Signed)
Patient advised and RX sent in. 

## 2023-02-01 ENCOUNTER — Telehealth: Payer: Self-pay | Admitting: Family Medicine

## 2023-02-01 NOTE — Telephone Encounter (Signed)
Patient called and states she has not heard from the Urologist in [place]. Looks like Dr. Sherron Monday put in a referral. Do you have any information on this?

## 2023-02-12 ENCOUNTER — Telehealth (HOSPITAL_COMMUNITY): Payer: Self-pay | Admitting: *Deleted

## 2023-02-12 ENCOUNTER — Other Ambulatory Visit: Payer: Self-pay | Admitting: Internal Medicine

## 2023-02-12 DIAGNOSIS — I10 Essential (primary) hypertension: Secondary | ICD-10-CM

## 2023-02-12 NOTE — Telephone Encounter (Signed)
Requested Prescriptions  Pending Prescriptions Disp Refills   lisinopril (ZESTRIL) 5 MG tablet [Pharmacy Med Name: Lisinopril 5 MG Oral Tablet] 90 tablet 1    Sig: Take 1 tablet by mouth once daily     Cardiovascular:  ACE Inhibitors Passed - 02/12/2023 10:55 AM      Passed - Cr in normal range and within 180 days    Creat  Date Value Ref Range Status  10/05/2022 0.71 0.50 - 1.03 mg/dL Final   Creatinine, Ser  Date Value Ref Range Status  01/20/2023 0.71 0.44 - 1.00 mg/dL Final         Passed - K in normal range and within 180 days    Potassium  Date Value Ref Range Status  01/20/2023 3.7 3.5 - 5.1 mmol/L Final  12/27/2014 4.3 mmol/L Final    Comment:    3.5-5.1 NOTE: New Reference Range  11/20/14          Passed - Patient is not pregnant      Passed - Last BP in normal range    BP Readings from Last 1 Encounters:  01/20/23 130/80         Passed - Valid encounter within last 6 months    Recent Outpatient Visits           1 month ago Hypertension, unspecified type   [facility] Margarita Mail, DO   2 months ago Hypertension, unspecified type   [facility] Margarita Mail, DO   3 months ago Shortness of breath   [facility] Margarita Mail, DO   4 months ago Shortness of breath   [facility] Margarita Mail, DO   4 months ago SOB (shortness of breath)   [facility] Margarita Mail, DO       Future Appointments             In 3 weeks End, Cristal Deer, MD [facility] [place]   In 2 months Margarita Mail, DO  [facility]   In 2 months Florian Buff, Rochel Brome, MD [facility] at [facility]

## 2023-02-12 NOTE — Telephone Encounter (Signed)
Reaching out to patient to offer assistance regarding upcoming cardiac imaging study; pt verbalizes understanding of appt date/time, parking situation and where to check in, pre-test NPO status and medications ordered, and verified current allergies; name and call back number provided for further questions should they arise  Larey Brick RN Navigator Cardiac Imaging [facility] [phone] office [phone] cell  Patient to hold her lisinopril and take 100mg  metoprolol tartrate two hours prior to her cardiac CT scan.

## 2023-02-13 DIAGNOSIS — Z419 Encounter for procedure for purposes other than remedying health state, unspecified: Secondary | ICD-10-CM | POA: Diagnosis not present

## 2023-02-15 ENCOUNTER — Ambulatory Visit
Admission: RE | Admit: 2023-02-15 | Discharge: 2023-02-15 | Disposition: A | Discharge status: Home / Self Care | Payer: Medicaid Other | Source: Ambulatory Visit | Attending: Internal Medicine | Admitting: Internal Medicine

## 2023-02-15 DIAGNOSIS — R072 Precordial pain: Secondary | ICD-10-CM | POA: Insufficient documentation

## 2023-02-15 DIAGNOSIS — Z7689 Persons encountering health services in other specified circumstances: Secondary | ICD-10-CM | POA: Diagnosis not present

## 2023-02-15 MED ORDER — NITROGLYCERIN 0.4 MG SL SUBL
0.8000 mg | SUBLINGUAL_TABLET | Freq: Once | SUBLINGUAL | Status: DC
  Filled 2023-02-15: qty 25

## 2023-02-15 MED ORDER — IOHEXOL 350 MG/ML SOLN
75.0000 mL | Freq: Once | INTRAVENOUS | Status: AC | PRN
  Administered 2023-02-15: 75 mL via INTRAVENOUS

## 2023-02-15 MED ORDER — NITROGLYCERIN 0.4 MG SL SUBL
0.4000 mg | SUBLINGUAL_TABLET | Freq: Once | SUBLINGUAL | Status: AC
  Administered 2023-02-15: 0.4 mg via SUBLINGUAL
  Filled 2023-02-15: qty 25

## 2023-02-15 NOTE — Progress Notes (Signed)
Pt completed scan with no issues. Pt ABCs intact. Pt denies any complaints. Pt encouraged to drink plenty of fluids throughout the day. Pt ambulatory with steady gait.

## 2023-02-24 ENCOUNTER — Ambulatory Visit: Payer: Medicaid Other | Attending: Internal Medicine

## 2023-02-24 DIAGNOSIS — R0602 Shortness of breath: Secondary | ICD-10-CM | POA: Diagnosis not present

## 2023-02-24 DIAGNOSIS — Z7689 Persons encountering health services in other specified circumstances: Secondary | ICD-10-CM | POA: Diagnosis not present

## 2023-02-24 LAB — ECHOCARDIOGRAM COMPLETE
AR max vel: 3.29 cm2
AV Area VTI: 3.59 cm2
AV Area mean vel: 3.32 cm2
AV Mean grad: 4 mmHg
AV Peak grad: 7.3 mmHg
Ao pk vel: 1.35 m/s
Area-P 1/2: 4.49 cm2
Calc EF: 64.5 %
S' Lateral: 2.9 cm
Single Plane A2C EF: 61.5 %
Single Plane A4C EF: 67.6 %

## 2023-02-25 DIAGNOSIS — Z7689 Persons encountering health services in other specified circumstances: Secondary | ICD-10-CM | POA: Diagnosis not present

## 2023-02-25 DIAGNOSIS — S63659A Sprain of metacarpophalangeal joint of unspecified finger, initial encounter: Secondary | ICD-10-CM | POA: Diagnosis not present

## 2023-02-25 DIAGNOSIS — M79641 Pain in right hand: Secondary | ICD-10-CM | POA: Diagnosis not present

## 2023-03-05 NOTE — Progress Notes (Unsigned)
Follow-up Outpatient Visit Date: 03/08/2023  Primary Care Provider: Margarita Mail, DO [address] [place] [address]  Chief Complaint: Palpitations  HPI:  Kristina Reed is a 57 y.o. female with history of hypertension, hypothyroidism, eosinophilic esophagitis, and cervical cancer, who presents for follow-up of shortness of breath and chest pain.  I met her last month, which time she reported transient shortness of breath and intermittent chest pressure when walking uphill.  We agreed to obtain an echo and coronary CTA, both of which were normal.  Today, Kristina Reed reports that she has been feeling fairly well.  She has noticed occasional palpitations, often with activity, that are associated with lightheadedness.  They seem to be happening every other day and can last up to a few minutes at a time.  She has not passed out or fallen.  She denies chest pain but reports occasional shortness of breath in bed at night.  She has not had any lower extremity edema.  --------------------------------------------------------------------------------------------------  Cardiovascular History & Procedures: Cardiovascular Problems: Shortness of breath Chest discomfort   Risk Factors: Hypertension and obesity   Cath/PCI: None   CV Surgery: None   EP Procedures and Devices: None   Non-Invasive Evaluation(s): TTE (02/24/2023): Normal LV size and wall thickness.  LVEF 60-65% with normal wall motion and diastolic function.  Normal RV size and function.  Normal biatrial size.  No significant valvular abnormalities.  Normal CVP. Coronary CTA (02/15/2023): Normal coronary arteries without stenosis or calcification.  No significant extracardiac findings in the visualized chest. TTE (01/31/2019): Normal LV size and wall thickness.  LVEF 55-6% with normal wall motion and diastolic function.  Normal RV size and function.  Normal biatrial size.  No significant valvular  abnormalities.  Recent CV Pertinent Labs: Lab Results  Component Value Date   CHOL 213 (H) 05/20/2022   HDL 78 05/20/2022   LDLCALC 112 (H) 05/20/2022   TRIG 119 05/20/2022   CHOLHDL 2.7 05/20/2022   INR 0.9 04/13/2012   BNP 34 10/05/2022   K 3.7 01/20/2023   K 4.3 12/27/2014   BUN 12 01/20/2023   BUN 7 01/13/2016   BUN 11 12/27/2014   CREATININE 0.71 01/20/2023   CREATININE 0.71 10/05/2022    Past medical and surgical history were reviewed and updated in EPIC.  Current Meds  Medication Sig   amitriptyline (ELAVIL) 25 MG tablet TAKE 1 TABLET BY MOUTH AT BEDTIME   estradiol (ESTRACE) 0.5 MG tablet Take 1 tablet (0.5 mg total) by mouth daily.   FLUoxetine (PROZAC) 20 MG capsule TAKE 1 CAPSULE BY MOUTH ONCE DAILY   levothyroxine (SYNTHROID) 75 MCG tablet Take 1 tablet (75 mcg total) by mouth daily before breakfast.   lisinopril (ZESTRIL) 5 MG tablet Take 1 tablet by mouth once daily   meloxicam (MOBIC) 15 MG tablet Take 15 mg by mouth daily.   omeprazole (PRILOSEC) 40 MG capsule Take 1 capsule (40 mg total) by mouth 2 (two) times daily before a meal.    Allergies: Prednisone, Methylprednisolone, and Percocet [oxycodone-acetaminophen]  Social History   Tobacco Use   Smoking status: Never    Passive exposure: Never   Smokeless tobacco: Never  Vaping Use   Vaping Use: Never used  Substance Use Topics   Alcohol use: Not Currently   Drug use: Never    Family History  Problem Relation Age of Onset   Diabetes Mother    Arrhythmia Mother        Pacemaker  Lung cancer Father 22   Breast cancer Maternal Grandmother 70   Cancer Paternal Grandmother        cancer?   Breast cancer Maternal Aunt        80s   Cirrhosis Cousin    Breast cancer Cousin    Breast cancer Other        x4    Review of Systems: A 12-system review of systems was performed and was negative except as noted in the  HPI.  --------------------------------------------------------------------------------------------------  Physical Exam: BP (!) 126/90 (BP Location: Left Arm, Patient Position: Sitting, Cuff Size: Normal)   Pulse 77   Ht 5' 1.5" (1.562 m)   Wt 172 lb 6.4 oz (78.2 kg)   LMP 05/09/1993 (Approximate)   SpO2 98%   BMI 32.05 kg/m   General:  NAD. Neck: No JVD or HJR. Lungs: Clear to auscultation bilaterally without wheezes or crackles. Heart: Regular rate and rhythm without murmurs, rubs, or gallops. Abdomen: Soft, nontender, nondistended. Extremities: No lower extremity edema.  Lab Results  Component Value Date   WBC 7.0 10/05/2022   HGB 13.5 10/05/2022   HCT 40.2 10/05/2022   MCV 84.3 10/05/2022   PLT 254 10/05/2022    Lab Results  Component Value Date   NA 139 01/20/2023   K 3.7 01/20/2023   CL 104 01/20/2023   CO2 29 01/20/2023   BUN 12 01/20/2023   CREATININE 0.71 01/20/2023   GLUCOSE 71 01/20/2023   ALT 25 10/05/2022    Lab Results  Component Value Date   CHOL 213 (H) 05/20/2022   HDL 78 05/20/2022   LDLCALC 112 (H) 05/20/2022   TRIG 119 05/20/2022   CHOLHDL 2.7 05/20/2022    --------------------------------------------------------------------------------------------------  ASSESSMENT AND PLAN: Palpitations: Currently happening every other day with some associated lightheadedness.  EKG at our last visit showed sinus rhythm.  Pulses again regular today.  We have agreed to obtain a 14-day event monitor (ZIO XT) for further evaluation.  Chest pain and shortness of breath: No further chest pain reported since our last visit.  Kristina Reed notes some dyspnea when in bed.  Preceding coronary CTA and echocardiogram were both normal, suggesting noncardiac etiologies for her symptoms.  Defer additional workup besides aforementioned event monitor.  Hypertension: Diastolic blood pressure mildly elevated today.  We discussed medication changes but have agreed to defer  adding anything at this time.  Continue lifestyle modifications.  Follow-up: Return to clinic in 6 weeks to review results of monitor.  Yvonne Kendall, MD 03/08/2023 11:01 AM

## 2023-03-06 ENCOUNTER — Other Ambulatory Visit: Payer: Self-pay | Admitting: Gastroenterology

## 2023-03-08 ENCOUNTER — Encounter: Payer: Self-pay | Admitting: Internal Medicine

## 2023-03-08 ENCOUNTER — Ambulatory Visit (INDEPENDENT_AMBULATORY_CARE_PROVIDER_SITE_OTHER): Payer: Medicaid Other

## 2023-03-08 ENCOUNTER — Ambulatory Visit: Payer: Medicaid Other | Attending: Internal Medicine | Admitting: Internal Medicine

## 2023-03-08 VITALS — BP 126/90 | HR 77 | Ht 61.5 in | Wt 172.4 lb

## 2023-03-08 DIAGNOSIS — I10 Essential (primary) hypertension: Secondary | ICD-10-CM | POA: Diagnosis not present

## 2023-03-08 DIAGNOSIS — Z7689 Persons encountering health services in other specified circumstances: Secondary | ICD-10-CM | POA: Diagnosis not present

## 2023-03-08 DIAGNOSIS — R002 Palpitations: Secondary | ICD-10-CM

## 2023-03-08 DIAGNOSIS — R0602 Shortness of breath: Secondary | ICD-10-CM | POA: Diagnosis not present

## 2023-03-08 DIAGNOSIS — R079 Chest pain, unspecified: Secondary | ICD-10-CM

## 2023-03-08 NOTE — Patient Instructions (Signed)
Medication Instructions:  Your physician recommends that you continue on your current medications as directed. Please refer to the Current Medication list given to you today.  *If you need a refill on your cardiac medications before your next appointment, please call your pharmacy*   Lab Work: None ordered today   Testing/Procedures: Your physician has recommended that you wear a 14 day Zio monitor.   This monitor is a medical device that records the heart's electrical activity. Doctors most often use these monitors to diagnose arrhythmias. Arrhythmias are problems with the speed or rhythm of the heartbeat. The monitor is a small device applied to your chest. You can wear one while you do your normal daily activities. While wearing this monitor if you have any symptoms to push the button and record what you felt. Once you have worn this monitor for the period of time provider prescribed (Usually 14 days), you will return the monitor device in the postage paid box. Once it is returned they will download the data collected and provide [name] with a report which the provider will then review and we will call you with those results. Important tips:  Avoid showering during the first 24 hours of wearing the monitor. Avoid excessive sweating to help maximize wear time. Do not submerge the device, no hot tubs, and no swimming pools. Keep any lotions or oils away from the patch. After 24 hours you may shower with the patch on. Take brief showers with your back facing the shower head.  Do not remove patch once it has been placed because that will interrupt data and decrease adhesive wear time. Push the button when you have any symptoms and write down what you were feeling. Once you have completed wearing your monitor, remove and place into box which has postage paid and place in your outgoing mailbox.  If for some reason you have misplaced your box then call our office and we can provide another box and/or  mail it off for you.      Follow-Up: At [facility], you and your health needs are our priority.  As part of our continuing mission to provide you with exceptional heart care, we have created designated Provider Care Teams.  These Care Teams include your primary Cardiologist (physician) and Advanced Practice Providers (APPs -  Physician Assistants and Nurse Practitioners) who all work together to provide you with the care you need, when you need it.  We recommend signing up for the patient portal called "MyChart".  Sign up information is provided on this After Visit Summary.  MyChart is used to connect with patients for Virtual Visits (Telemedicine).  Patients are able to view lab/test results, encounter notes, upcoming appointments, etc.  Non-urgent messages can be sent to your provider as well.   To learn more about what you can do with MyChart, go to [email or web address].    Your next appointment:   6 week(s)  Provider:   You may see Yvonne Kendall, MD or one of the following Advanced Practice Providers on your designated Care Team:   Nicolasa Ducking, NP Eula Listen, PA-C Cadence Fransico Michael, PA-C Charlsie Quest, NP

## 2023-03-10 ENCOUNTER — Telehealth: Payer: Self-pay

## 2023-03-10 DIAGNOSIS — R002 Palpitations: Secondary | ICD-10-CM

## 2023-03-10 MED ORDER — AMITRIPTYLINE HCL 25 MG PO TABS: 25.0000 mg | ORAL_TABLET | Freq: Every day | ORAL | 0 refills | Status: DC

## 2023-03-10 NOTE — Telephone Encounter (Signed)
Received a refill request for patient Amitriptyline sent medication to the pharmacy

## 2023-03-11 ENCOUNTER — Telehealth: Payer: Self-pay | Admitting: Internal Medicine

## 2023-03-11 NOTE — Telephone Encounter (Signed)
Patient has been advised to place the monitor in the stamped box and mail it back. Nothing further needed.

## 2023-03-11 NOTE — Telephone Encounter (Signed)
Pt called in asking where is she supposed to send heart monitor back to when she is finished with it.

## 2023-03-15 DIAGNOSIS — Z419 Encounter for procedure for purposes other than remedying health state, unspecified: Secondary | ICD-10-CM | POA: Diagnosis not present

## 2023-03-26 ENCOUNTER — Ambulatory Visit: Payer: Self-pay

## 2023-03-26 ENCOUNTER — Ambulatory Visit
Admission: RE | Admit: 2023-03-26 | Discharge: 2023-03-26 | Disposition: A | Discharge status: Home / Self Care | Payer: Medicaid Other | Attending: Diagnostic Radiology | Admitting: Diagnostic Radiology

## 2023-03-26 ENCOUNTER — Other Ambulatory Visit: Payer: Self-pay | Admitting: Family Medicine

## 2023-03-26 ENCOUNTER — Ambulatory Visit
Admission: RE | Admit: 2023-03-26 | Discharge: 2023-03-26 | Disposition: A | Discharge status: Home / Self Care | Payer: Medicaid Other | Source: Ambulatory Visit | Attending: Family Medicine | Admitting: Family Medicine

## 2023-03-26 ENCOUNTER — Encounter: Payer: Self-pay | Admitting: Family Medicine

## 2023-03-26 ENCOUNTER — Ambulatory Visit (INDEPENDENT_AMBULATORY_CARE_PROVIDER_SITE_OTHER): Payer: Medicaid Other | Admitting: Family Medicine

## 2023-03-26 VITALS — BP 124/80 | HR 100 | Temp 98.1°F | Resp 16 | Ht 61.5 in | Wt 170.5 lb

## 2023-03-26 DIAGNOSIS — R059 Cough, unspecified: Secondary | ICD-10-CM | POA: Diagnosis not present

## 2023-03-26 DIAGNOSIS — W06XXXA Fall from bed, initial encounter: Secondary | ICD-10-CM | POA: Diagnosis not present

## 2023-03-26 DIAGNOSIS — W19XXXA Unspecified fall, initial encounter: Secondary | ICD-10-CM

## 2023-03-26 DIAGNOSIS — R0789 Other chest pain: Secondary | ICD-10-CM | POA: Insufficient documentation

## 2023-03-26 DIAGNOSIS — S20219A Contusion of unspecified front wall of thorax, initial encounter: Secondary | ICD-10-CM

## 2023-03-26 MED ORDER — ACETAMINOPHEN 500 MG PO TABS
500.0000 mg | ORAL_TABLET | Freq: Four times a day (QID) | ORAL | 0 refills | Status: DC | PRN
Start: 2023-03-26 — End: 2023-04-15

## 2023-03-26 MED ORDER — BENZONATATE 100 MG PO CAPS
100.0000 mg | ORAL_CAPSULE | Freq: Three times a day (TID) | ORAL | 0 refills | Status: DC | PRN
Start: 2023-03-26 — End: 2023-04-15

## 2023-03-26 NOTE — Patient Instructions (Addendum)
Please use only one NSAID medication daily (mobic as prescribed OR aleve, naproxen or ibuprofen) and tylenol [phone] mg every 6-8 hours, and you can also the cough medication, heat or ice to affected areas.  Your Xrays were negative and did not see any broken bones or any problems in your lungs or chest.  You should try to take a deep breath at least once an hour (even though it hurts) to prevent developing a pneumonia.  If you have any severe chest pain or new or suddenly worsening difficulty breathing call 911 or go to the ER.   Rib Contusion A rib contusion is a deep bruise on the rib area. Contusions are the result of a blunt trauma that causes bleeding and injury to the tissues under the skin. A rib contusion may involve bruising of the ribs and of the skin and muscles in the area. The skin over the contusion may turn blue, purple, or yellow. Minor injuries result in a painless contusion. More severe contusions may be painful and swollen for a few weeks. What are the causes? This condition is usually caused by a hard, direct hit to an area of the body. This often occurs while playing contact sports. What are the signs or symptoms? Symptoms of this condition include: Swelling and redness of the injured area. Discoloration of the injured area. Tenderness and soreness of the injured area. Pain with or without movement. Pain when breathing in. How is this diagnosed? This condition may be diagnosed based on: Your symptoms and medical history. A physical exam. Imaging tests--such as an X-ray, CT scan, or MRI--to determine if there were internal injuries or broken bones (fractures). How is this treated? This condition may be treated with: Rest. This is often the best treatment for a rib contusion. Ice packs. This reduces swelling and inflammation. Deep-breathing exercises. These may be recommended to reduce the risk for lung collapse and pneumonia. Medicines. Over-the-counter or  prescription medicines may be given to control pain. Injection of a numbing medicine around the nerve near your injury (nerve block). Follow these instructions at home: Medicines Take over-the-counter and prescription medicines only as told by your health care provider. Ask your health care provider if the medicine prescribed to you: Requires you to avoid driving or using machinery. Can cause constipation. You may need to take these actions to prevent or treat constipation: Drink enough fluid to keep your urine pale yellow. Take over-the-counter or prescription medicines. Eat foods that are high in fiber, such as beans, whole grains, and fresh fruits and vegetables. Limit foods that are high in fat and processed sugars, such as fried or sweet foods. Managing pain, stiffness, and swelling If directed, put ice on the injured area. To do this: Put ice in a plastic bag. Place a towel between your skin and the bag. Leave the ice on for 20 minutes, 2-3 times a day. Remove the ice if your skin turns bright red. This is very important. If you cannot feel pain, heat, or cold, you have a greater risk of damage to the area.  Activity Rest the injured area. Avoid strenuous activity and any activities or movements that cause pain. Be careful during activities, and avoid bumping the injured area. Do not lift anything that is heavier than 5 lb (2.3 kg), or the limit that you are told, until your health care provider says that it is safe. General instructions  Do not use any products that contain nicotine or tobacco, such as cigarettes, e-cigarettes,  and chewing tobacco. These can delay healing. If you need help quitting, ask your health care provider. Do deep-breathing exercises as told by your health care provider. If you were given an incentive spirometer, use it every 1-2 hours while you are awake, or as recommended by your health care provider. This device measures how well you are filling your lungs  with each breath. Keep all follow-up visits. This is important. Contact a health care provider if you have: Increased bruising or swelling. Pain that is not controlled with treatment. A fever. Get help right away if you: Have difficulty breathing or shortness of breath. Develop a continual cough, or you cough up thick or bloody mucus from your lungs (sputum). Feel nauseous or you vomit. Have pain in your abdomen. These symptoms may represent a serious problem that is an emergency. Do not wait to see if the symptoms will go away. Get medical help right away. Call your local emergency services (911 in the [place] Do not drive yourself to the hospital. Summary A rib contusion is a deep bruise on your rib area. Contusions are the result of a blunt trauma that causes bleeding and injury to the tissues under the skin. The skin over the contusion may turn blue, purple, or yellow. Minor injuries may cause a painless contusion. More severe contusions may be painful and swollen for a few weeks. Rest the injured area. Avoid strenuous activity and any activities or movements that cause pain. This information is not intended to replace advice given to you by your health care provider. Make sure you discuss any questions you have with your health care provider. Document Revised: 12/06/2019 Document Reviewed: 12/06/2019 Elsevier Patient Education  2024 [employer].

## 2023-03-26 NOTE — Telephone Encounter (Signed)
     Chief Complaint: Larey Seat out of bed, having rib pain, both sides. Hurts to cough, sneeze Symptoms: Pain Frequency: 2 days ago Pertinent Negatives: Patient denies  Disposition: [] ED /[] Urgent Care (no appt availability in office) / [x] Appointment(In office/virtual)/ []  [facility]/ [] Home Care/ [] Refused Recommended Disposition /[] [facility]/ []  Follow-up with PCP Additional Notes: [facility] Powders not helping pain  Reason for Disposition  MILD weakness (i.e., does not interfere with ability to work, go to school, normal activities)  (Exception: Mild weakness is a chronic symptom.)  Answer Assessment - Initial Assessment Questions 1. MECHANISM: "How did the fall happen?"     Larey Seat out of bed 2. DOMESTIC VIOLENCE AND ELDER ABUSE SCREENING: "Did you fall because someone pushed you or tried to hurt you?" If Yes, ask: "Are you safe now?"     No 3. ONSET: "When did the fall happen?" (e.g., minutes, hours, or days ago)     2 days ago 4. LOCATION: "What part of the body hit the ground?" (e.g., back, buttocks, head, hips, knees, hands, head, stomach)     Side 5. INJURY: "Did you hurt (injure) yourself when you fell?" If Yes, ask: "What did you injure? Tell me more about this?" (e.g., body area; type of injury; pain severity)"     Ribs hurt 6. PAIN: "Is there any pain?" If Yes, ask: "How bad is the pain?" (e.g., Scale 1-10; or mild,  moderate, severe)   - NONE (0): No pain   - MILD (1-3): Doesn't interfere with normal activities    - MODERATE (4-7): Interferes with normal activities or awakens from sleep    - SEVERE (8-10): Excruciating pain, unable to do any normal activities      10 7. SIZE: For cuts, bruises, or swelling, ask: "How large is it?" (e.g., inches or centimeters)      No 8. PREGNANCY: "Is there any chance you are pregnant?" "When was your last menstrual period?"     No 9. OTHER SYMPTOMS: "Do you have any other symptoms?" (e.g., dizziness, fever, weakness;  new onset or worsening).      No 10. CAUSE: "What do you think caused the fall (or falling)?" (e.g., tripped, dizzy spell)       Fell off bed  Protocols used: Falls and [facility]

## 2023-03-26 NOTE — Progress Notes (Signed)
Rib and chest wall pain s/p fall 2 d ago Ordering xray prior to pt presenting for last OV of the day

## 2023-03-26 NOTE — Progress Notes (Signed)
Patient ID: DELANDA REMPFER, female    DOB: [birth year], 57 y.o.   MRN: [id number]  PCP: Margarita Mail, DO  Chief Complaint  Patient presents with   Genia Hotter couple days ago from bed to floor unsure if ribs are broken pt states pain is at 10-worst pain ever    Subjective:   JAI OVERFIELD is a 57 y.o. 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   Erosive gastritis 11/20/2022   S/P TKR (total knee replacement) 11/25/2021   S/P TKR (total knee replacement) using cement, right 11/24/2021   Erosive esophagitis    Gastric erosion determined by endoscopy    History of colonic polyps    Chronic pain syndrome 11/26/2020   Bilateral primary osteoarthritis of knee 11/26/2020   Grade I internal hemorrhoids 11/16/2019   Genetic testing 04/20/2019   Family history of breast cancer    History of cervical cancer    Chest pain of uncertain etiology 03/17/2019   Family hx-breast malignancy 12/16/2018   Dysphagia    Essential hypertension 01/19/2018   Obesity (BMI 30.0-34.9) 01/19/2018   Eosinophilic esophagitis 09/03/2017   Apophysitis 08/26/2017   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   Pain in joint, multiple sites 07/05/2017   Osteoarthritis of knee 07/05/2017   Lichen sclerosus et atrophicus 04/27/2017   Headache 04/27/2017   Headache disorder 02/17/2017   Post-concussion headache 02/17/2017   Medication monitoring encounter 09/21/2016   Hematuria 04/23/2016   Hx of cervical cancer 01/30/2016    Menopause 01/30/2016   Status post bilateral breast biopsy 01/07/2016   Chronic pain of both knees 07/23/2014   Depression, major, recurrent, moderate (HCC) 02/02/2014   Adult hypothyroidism 02/02/2014   Obstructive apnea 02/02/2014   Anxiety and depression 02/02/2014   Incomplete bladder emptying 12/12/2012   Tenosynovitis of foot 05/30/2012   Benign neoplasm of kidney 05/13/2012   Urge incontinence 05/13/2012   FOM (frequency of micturition) 05/13/2012   Chronic pain of right hand 05/05/2012   Tarsal tunnel syndrome 03/03/2012      Current Outpatient Medications:    nitrofurantoin (MACRODANTIN) 100 MG capsule, Take 1 capsule (100 mg total) by mouth 2 (two) times daily. (Patient not taking: Reported on 03/08/2023), Disp: 14 capsule, Rfl: 0   amitriptyline (ELAVIL) 25 MG tablet, Take 1 tablet (25 mg total) by mouth at bedtime., Disp: 30 tablet, Rfl: 0   estradiol (ESTRACE) 0.5 MG tablet, Take 1 tablet (0.5 mg total) by mouth daily., Disp: 90 tablet, Rfl: 3   FLUoxetine (PROZAC) 20 MG capsule, TAKE 1 CAPSULE BY MOUTH ONCE DAILY, Disp: 90 capsule, Rfl: 1   levothyroxine (SYNTHROID) 75 MCG tablet, Take 1 tablet (75 mcg total) by mouth daily before breakfast., Disp: 90 tablet, Rfl: 1   lisinopril (ZESTRIL) 5 MG tablet, Take 1 tablet by mouth once daily, Disp: 90 tablet, Rfl: 1   meloxicam (MOBIC) 15 MG tablet, Take 15 mg by mouth daily., Disp: , Rfl:  omeprazole (PRILOSEC) 40 MG capsule, Take 1 capsule (40 mg total) by mouth 2 (two) times daily before a meal., Disp: 60 capsule, Rfl: 2   Allergies  Allergen Reactions   Prednisone Nausea And Vomiting   Methylprednisolone Palpitations    Increased heart rate   Percocet [Oxycodone-Acetaminophen] Palpitations     Social History   Tobacco Use   Smoking status: Never    Passive exposure: Never   Smokeless tobacco: Never  Vaping Use   Vaping status: Never Used  Substance Use Topics   Alcohol use: Not Currently   Drug use:  Never      Chart Review Today: I personally reviewed active problem list, medication list, allergies, family history, social history, health maintenance, notes from last encounter, lab results, imaging with the patient/caregiver today.   Review of Systems  Constitutional: Negative.   HENT: Negative.    Eyes: Negative.   Respiratory: Negative.    Cardiovascular: Negative.   Gastrointestinal: Negative.   Endocrine: Negative.   Genitourinary: Negative.   Musculoskeletal: Negative.   Skin: Negative.   Allergic/Immunologic: Negative.   Neurological: Negative.   Hematological: Negative.   Psychiatric/Behavioral: Negative.    All other systems reviewed and are negative.      Objective:   There were no vitals filed for this visit.  There is no height or weight on file to calculate BMI.  Physical Exam Vitals and nursing note reviewed.  Constitutional:      Appearance: She is well-developed.  HENT:     Head: Normocephalic and atraumatic.     Nose: Nose normal.  Eyes:     General:        Right eye: No discharge.        Left eye: No discharge.     Conjunctiva/sclera: Conjunctivae normal.  Neck:     Trachea: No tracheal deviation.  Cardiovascular:     Rate and Rhythm: Normal rate and regular rhythm.  Pulmonary:     Effort: Pulmonary effort is normal. No tachypnea, accessory muscle usage, respiratory distress or retractions.     Breath sounds: No stridor, decreased air movement or transmitted upper airway sounds. No decreased breath sounds, wheezing, rhonchi or rales.  Chest:     Chest wall: No lacerations, deformity, swelling, tenderness, crepitus or edema.  Musculoskeletal:        General: Normal range of motion.  Skin:    General: Skin is warm and dry.     Findings: No rash.  Neurological:     Mental Status: She is alert.     Motor: No abnormal muscle tone.     Coordination: Coordination normal.  Psychiatric:        Behavior: Behavior normal.      Results for  orders placed or performed in visit on 02/24/23  ECHOCARDIOGRAM COMPLETE  Result Value Ref Range   AR max vel 3.29 cm2   AV Peak grad 7.3 mmHg   Ao pk vel 1.35 m/s   S' Lateral 2.90 cm   Area-P 1/2 4.49 cm2   AV Area VTI 3.59 cm2   AV Mean grad 4.0 mmHg   Single Plane A4C EF 67.6 %   Single Plane A2C EF 61.5 %   Calc EF 64.5 %   AV Area mean vel 3.32 cm2   Est EF 60 - 65%        Assessment & Plan:   1. Contusion of rib, unspecified laterality, initial encounter Mild mechanism of injury, negative x-rays, physical exam  and vital signs reassuring likely a mild contusion though I am not quite clear how she has bilateral chest wall pain when she likely only bumped 1 side of her ribs when she fell out of bed She has no flail chest no bruising edema or redness I reviewed medications that she can take to help manage her pain and encouraged her to continue to take deep breaths once every hour - acetaminophen (TYLENOL) 500 MG tablet; Take 1 tablet (500 mg total) by mouth every 6 (six) hours as needed for moderate pain or mild pain.  Dispense: 30 tablet; Refill: 0  2. Acute chest wall pain Secondary to #1 - acetaminophen (TYLENOL) 500 MG tablet; Take 1 tablet (500 mg total) by mouth every 6 (six) hours as needed for moderate pain or mild pain.  Dispense: 30 tablet; Refill: 0  3. Fall from bed, initial encounter No other injuries  4. Cough -she reports the pain being bothersome when she starts to cough there was no coughing throughout the exam but I did give her Tessalon Perles to try to minimize her cough    Danelle Berry, PA-C 03/26/23 1:59 PM

## 2023-04-14 NOTE — Progress Notes (Unsigned)
Established Patient Office Visit  Subjective   Patient ID: Kristina Reed, female    DOB: 05/07/1966  Age: 57 y.o. MRN: [id number]  No chief complaint on file.   HPI Patient is here for follow up on chronic medical conditions.   Hypertension: -Medications: Lisinopril 2.5 mg BID  -Checking BP at home (average): 135-150/85-100 -Denies any SOB, CP, vision changes, LE edema or symptoms of hypotension. Does endorse occasional headaches which are new for her.  HLD: -Medications: Nothing -Last lipid panel: Lipid Panel     Component Value Date/Time   CHOL 213 (H) 05/20/2022 1110   TRIG 119 05/20/2022 1110   HDL 78 05/20/2022 1110   CHOLHDL 2.7 05/20/2022 1110   VLDL 26 09/02/2016 0903   LDLCALC 112 (H) 05/20/2022 1110    The 10-year ASCVD risk score (Arnett DK, et al., 2019) is: 1.9%   Values used to calculate the score:     Age: 58 years     Sex: Female     Is Non-Hispanic African American: No     Diabetic: No     Tobacco smoker: No     Systolic Blood Pressure: 124 mmHg     Is BP treated: Yes     HDL Cholesterol: 78 mg/dL     Total Cholesterol: 213 mg/dL   Hypothyroidism: -Medications: Levothyroxine 75 mcg -Patient is compliant with the above medication (s) at the above dose and reports no medication side effects.  -Denies weight changes, cold./heat intolerance, skin changes, anxiety/palpitations  -Last TSH: 1/24 4.48  GERD: -Currently on omeprazole 20 mg daily -Was seen by GI and had EGD 11/20/22 which was negative, planning on following with with GI in [place] later this week  Elevated LFTs: -Labs from 9/23 showing AST 46, ALT 55, improved on labs from 1/24 -CT abdomen and pelvis from 04/07/2022 showing fatty liver. -Patient has been working on diet and losing weight.  Review of Systems  Constitutional:  Negative for chills and fever.  Eyes:  Negative for blurred vision.  Respiratory:  Negative for shortness of breath.   Cardiovascular:  Negative for chest  pain.  Neurological:  Positive for headaches. Negative for dizziness.      Objective:     LMP 05/09/1993 (Approximate)  BP Readings from Last 3 Encounters:  03/26/23 124/80  03/08/23 (!) 126/90  02/15/23 130/81   Wt Readings from Last 3 Encounters:  03/26/23 170 lb 8 oz (77.3 kg)  03/08/23 172 lb 6.4 oz (78.2 kg)  01/20/23 174 lb 6 oz (79.1 kg)      Physical Exam Constitutional:      Appearance: Normal appearance.  HENT:     Head: Normocephalic and atraumatic.  Eyes:     Conjunctiva/sclera: Conjunctivae normal.  Cardiovascular:     Rate and Rhythm: Normal rate and regular rhythm.  Pulmonary:     Effort: Pulmonary effort is normal.     Breath sounds: Normal breath sounds.  Abdominal:     Comments: Tenderness to palpation in the LLQ  Skin:    General: Skin is warm and dry.  Neurological:     General: No focal deficit present.     Mental Status: She is alert. Mental status is at baseline.  Psychiatric:        Mood and Affect: Mood normal.        Behavior: Behavior normal.     No results found for any visits on 04/15/23.  Last CBC Lab Results  Component Value Date  WBC 7.0 10/05/2022   HGB 13.5 10/05/2022   HCT 40.2 10/05/2022   MCV 84.3 10/05/2022   MCH 28.3 10/05/2022   RDW 12.3 10/05/2022   PLT 254 10/05/2022   Last metabolic panel Lab Results  Component Value Date   GLUCOSE 71 01/20/2023   NA 139 01/20/2023   K 3.7 01/20/2023   CL 104 01/20/2023   CO2 29 01/20/2023   BUN 12 01/20/2023   CREATININE 0.71 01/20/2023   GFRNONAA >60 01/20/2023   CALCIUM 8.9 01/20/2023   PROT 7.2 10/05/2022   ALBUMIN 4.0 04/08/2022   LABGLOB 2.9 01/13/2016   AGRATIO 1.6 01/13/2016   BILITOT 0.3 10/05/2022   ALKPHOS 87 04/08/2022   AST 19 10/05/2022   ALT 25 10/05/2022   ANIONGAP 6 01/20/2023   Last lipids Lab Results  Component Value Date   CHOL 213 (H) 05/20/2022   HDL 78 05/20/2022   LDLCALC 112 (H) 05/20/2022   TRIG 119 05/20/2022   CHOLHDL 2.7  05/20/2022   Last hemoglobin A1c Lab Results  Component Value Date   HGBA1C 5.8 (H) 03/03/2022   Last thyroid functions Lab Results  Component Value Date   TSH 4.48 10/05/2022   Last vitamin D No results found for: "25OHVITD2", "25OHVITD3", "VD25OH" Last vitamin B12 and Folate No results found for: "VITAMINB12", "FOLATE"    The 10-year ASCVD risk score (Arnett DK, et al., 2019) is: 1.9%    Assessment & Plan:   1. Hypertension, unspecified type: Blood pressure borderline elevated today, higher at home with occasional headaches. Will start Lisinopril 5 mg daily, have her continue to monitor blood pressure at home and follow up here in 1 month to recheck.   - lisinopril (ZESTRIL) 5 MG tablet; Take 1 tablet (5 mg total) by mouth daily.  Dispense: 30 tablet; Refill: 1  2. Adult hypothyroidism: Stable, doing well on Levothyroxine 75 mcg dose.   3. Gastroesophageal reflux disease, unspecified whether esophagitis present: Currently on Prilosec 20 mg having some globus sensation, EGD reviewed from 11/20/22 which was normal, following up with GI in [place] later this week.   No follow-ups on file.    Margarita Mail, DO

## 2023-04-15 ENCOUNTER — Encounter: Payer: Self-pay | Admitting: Internal Medicine

## 2023-04-15 ENCOUNTER — Ambulatory Visit (INDEPENDENT_AMBULATORY_CARE_PROVIDER_SITE_OTHER): Payer: Medicaid Other | Admitting: Internal Medicine

## 2023-04-15 VITALS — BP 116/84 | HR 93 | Temp 98.0°F | Resp 18 | Ht 61.5 in | Wt 174.3 lb

## 2023-04-15 DIAGNOSIS — E039 Hypothyroidism, unspecified: Secondary | ICD-10-CM | POA: Diagnosis not present

## 2023-04-15 DIAGNOSIS — F331 Major depressive disorder, recurrent, moderate: Secondary | ICD-10-CM

## 2023-04-15 DIAGNOSIS — I10 Essential (primary) hypertension: Secondary | ICD-10-CM

## 2023-04-15 DIAGNOSIS — Z419 Encounter for procedure for purposes other than remedying health state, unspecified: Secondary | ICD-10-CM | POA: Diagnosis not present

## 2023-04-15 DIAGNOSIS — Z7689 Persons encountering health services in other specified circumstances: Secondary | ICD-10-CM | POA: Diagnosis not present

## 2023-04-15 MED ORDER — FLUOXETINE HCL 20 MG PO CAPS
20.0000 mg | ORAL_CAPSULE | Freq: Every day | ORAL | 1 refills | Status: AC
Start: 2023-04-15 — End: ?

## 2023-04-15 MED ORDER — LEVOTHYROXINE SODIUM 75 MCG PO TABS: 75.0000 ug | ORAL_TABLET | Freq: Every day | ORAL | 1 refills | Status: DC

## 2023-04-16 ENCOUNTER — Ambulatory Visit (INDEPENDENT_AMBULATORY_CARE_PROVIDER_SITE_OTHER): Payer: Medicaid Other | Admitting: Obstetrics and Gynecology

## 2023-04-16 ENCOUNTER — Encounter: Payer: Self-pay | Admitting: Obstetrics and Gynecology

## 2023-04-16 VITALS — BP 118/80 | HR 92 | Ht 61.0 in | Wt 175.0 lb

## 2023-04-16 DIAGNOSIS — R350 Frequency of micturition: Secondary | ICD-10-CM

## 2023-04-16 DIAGNOSIS — N816 Rectocele: Secondary | ICD-10-CM | POA: Diagnosis not present

## 2023-04-16 DIAGNOSIS — N368 Other specified disorders of urethra: Secondary | ICD-10-CM

## 2023-04-16 DIAGNOSIS — N3281 Overactive bladder: Secondary | ICD-10-CM

## 2023-04-16 DIAGNOSIS — N361 Urethral diverticulum: Secondary | ICD-10-CM | POA: Diagnosis not present

## 2023-04-16 DIAGNOSIS — N393 Stress incontinence (female) (male): Secondary | ICD-10-CM

## 2023-04-16 DIAGNOSIS — N323 Diverticulum of bladder: Secondary | ICD-10-CM

## 2023-04-16 MED ORDER — TROSPIUM CHLORIDE ER 60 MG PO CP24
1.0000 | ORAL_CAPSULE | Freq: Every day | ORAL | 5 refills | Status: DC
Start: 2023-04-16 — End: 2023-04-23

## 2023-04-16 NOTE — Progress Notes (Unsigned)
[facility] New Patient Evaluation and Consultation  Referring Provider: Alfredo Martinez, MD PCP: Margarita Mail, DO Date of Service: 04/16/2023  SUBJECTIVE Chief Complaint: New Patient (Initial Visit) Kristina Reed is a 57 y.o. female here for a consult for a turned bladder. Pt could not leave a urine specimen./)  History of Present Illness: Kristina Reed is a 57 y.o. White or Caucasian female seen in consultation at the request of Dr. Sherron Monday for evaluation of urethral diverticulum.    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   Reports history of cervical cancer and hysterectomy. Las pap 12/2022- negative  Urinary Symptoms: Leaks urine with cough/ sneeze, laughing, exercise, lifting, with movement to the bathroom, without sensation, and while asleep Leaks 5 time(s) per day. UUI > SUI Pad use: none She is bothered by her UI symptoms.  Day time voids- every 1-1.5 hours.  Nocturia: 3 times per night to void. Voiding dysfunction: she empties her bladder well.  does not use a catheter to empty bladder.  When urinating, she feels a weak stream, difficulty starting urine stream, and dribbling after finishing Drinks: 2 cups coffee, pepsi 4-5 cans per day, 1 bottle water per day, occasional beer Has not been on a medication for her bladder  UTIs: 2 UTI's in the last year.   Denies history of blood in urine and kidney or bladder stones  Reports history of urethral swelling but never had an MRI.   Pelvic Organ Prolapse Symptoms:                  She Denies a feeling of a bulge the vaginal area.   Bowel Symptom: Bowel movements: 2 time(s) per day Stool consistency: hard Straining: yes.  Splinting: yes.  Incomplete evacuation: yes.  She Denies accidental bowel leakage / fecal incontinence Bowel regimen: none   Sexual Function Sexually active: not often   Pelvic Pain Denies pelvic pain  Past Medical  History:  Past Medical History:  Diagnosis Date   Anxiety    Arthritis    right knee and right elbow   Cancer (HCC)    Cervical CA with partial hysterectomy.   Cognitive impairment, mild, so stated    Depression    Diverticulosis    Eosinophilic esophagitis 09/03/2017   Biopsy Dec 2018   Esophageal dysphagia    Family history of breast cancer 05/2021   cancer genetic testing letter sent   Gallstones    GERD (gastroesophageal reflux disease)    History of cervical cancer    Hx of cervical cancer 01/30/2016   Hypertension    Hypothyroidism    Sleep apnea    does not use a C-PAP   Status post partial hysterectomy    Due to Cervical CA   Vaginal inclusion cyst      Past Surgical History:   Past Surgical History:  Procedure Laterality Date   BREAST BIOPSY Bilateral 01/01/2016   Fibroadenoma   COLONOSCOPY WITH PROPOFOL N/A 06/27/2015   Procedure: COLONOSCOPY WITH PROPOFOL;  Surgeon: Elnita Maxwell, MD;  Location: [facility] ENDOSCOPY;  Service: Endoscopy;  Laterality: N/A;   COLONOSCOPY WITH PROPOFOL N/A 09/23/2017   Procedure: COLONOSCOPY WITH PROPOFOL;  Surgeon: Wyline Mood, MD;  Location: [facility] ENDOSCOPY;  Service: Gastroenterology;  Laterality: N/A;   COLONOSCOPY WITH PROPOFOL N/A 06/09/2021   Procedure: COLONOSCOPY WITH PROPOFOL;  Surgeon: Toney Reil, MD;  Location: [facility] ENDOSCOPY;  Service: Gastroenterology;  Laterality: N/A;  ESOPHAGOGASTRODUODENOSCOPY (EGD) WITH PROPOFOL N/A 07/22/2017   Procedure: ESOPHAGOGASTRODUODENOSCOPY (EGD) WITH PROPOFOL;  Surgeon: Wyline Mood, MD;  Location: [facility] ENDOSCOPY;  Service: Gastroenterology;  Laterality: N/A;   ESOPHAGOGASTRODUODENOSCOPY (EGD) WITH PROPOFOL N/A 03/31/2018   Procedure: ESOPHAGOGASTRODUODENOSCOPY (EGD) WITH PROPOFOL;  Surgeon: Toney Reil, MD;  Location: [facility] ENDOSCOPY;  Service: Gastroenterology;  Laterality: N/A;   ESOPHAGOGASTRODUODENOSCOPY (EGD) WITH PROPOFOL N/A 08/25/2021   Procedure:  ESOPHAGOGASTRODUODENOSCOPY (EGD) WITH PROPOFOL;  Surgeon: Toney Reil, MD;  Location: [facility] ENDOSCOPY;  Service: Gastroenterology;  Laterality: N/A;   ESOPHAGOGASTRODUODENOSCOPY (EGD) WITH PROPOFOL N/A 11/20/2022   Procedure: ESOPHAGOGASTRODUODENOSCOPY (EGD) WITH PROPOFOL;  Surgeon: Toney Reil, MD;  Location: [facility] ENDOSCOPY;  Service: Gastroenterology;  Laterality: N/A;   KNEE ARTHROSCOPY Right 07/22/2016   Procedure: ARTHROSCOPY KNEE debridement microfracture;  Surgeon: Erin Sons, MD;  Location: [facility] ORS;  Service: Orthopedics;  Laterality: Right;   KNEE ARTHROSCOPY WITH MEDIAL MENISECTOMY Left 11/10/2017   Procedure: KNEE ARTHROSCOPY WITH PARTIAL MEDIAL MENISECTOMY&patella femoral debridement, & ablation;  Surgeon: Erin Sons, MD;  Location: [facility] ORS;  Service: Orthopedics;  Laterality: Left;   TOTAL KNEE ARTHROPLASTY Right 11/24/2021   Procedure: TOTAL KNEE ARTHROPLASTY;  Surgeon: Lyndle Herrlich, MD;  Location: [facility] ORS;  Service: Orthopedics;  Laterality: Right;   TUBAL LIGATION     VAGINAL HYSTERECTOMY     partial hysterectomy     Past OB/GYN History: OB History  Gravida Para Term Preterm AB Living  2 2 2     2   SAB IAB Ectopic Multiple Live Births          2    # Outcome Date GA Lbr Len/2nd Weight Sex Type Anes PTL Lv  2 Term 1998    F Vag-Spont  N LIV  1 Term 1988    F Vag-Spont  N LIV    Obstetric Comments  Menstrual age: 32    Age 1st Pregnancy: 3    S/p hysterectomy for ? Cervical cancer   Medications: She has a current medication list which includes the following prescription(s): estradiol, fluoxetine, levothyroxine, lisinopril, meloxicam, omeprazole, and trospium chloride.   Allergies: Patient is allergic to prednisone, methylprednisolone, and percocet [oxycodone-acetaminophen].   Social History:  Social History   Tobacco Use   Smoking status: Never    Passive exposure: Never   Smokeless tobacco: Never  Vaping Use   Vaping  status: Never Used  Substance Use Topics   Alcohol use: Not Currently   Drug use: Never    Relationship status: long-term partner She lives with boyfriend, grandkids and daughter.   She is not employed. Regular exercise: Yes: walking History of abuse: No  Family History:   Family History  Problem Relation Age of Onset   Diabetes Mother    Arrhythmia Mother        Pacemaker   Lung cancer Father 10   Breast cancer Maternal Grandmother 10   Cancer Paternal Grandmother        cancer?   Breast cancer Maternal Aunt        1s   Cirrhosis Cousin    Breast cancer Cousin    Breast cancer Other        x4     Review of Systems: Review of Systems  Constitutional:  Positive for malaise/fatigue. Negative for fever and weight loss.  Respiratory:  Negative for cough, shortness of breath and wheezing.   Cardiovascular:  Negative for chest pain, palpitations and leg swelling.  Gastrointestinal:  Negative for abdominal pain and  blood in stool.  Genitourinary:  Negative for dysuria.  Musculoskeletal:  Negative for myalgias.  Skin:  Negative for rash.  Neurological:  Negative for dizziness and headaches.  Endo/Heme/Allergies:  Bruises/bleeds easily.       + hot flashes  Psychiatric/Behavioral:  Positive for depression. The patient is nervous/anxious.      OBJECTIVE Physical Exam: Vitals:   04/16/23 1010  BP: 118/80  Pulse: 92  Weight: 175 lb (79.4 kg)  Height: 5\' 1"  (1.549 m)    Physical Exam Constitutional:      General: She is not in acute distress. Pulmonary:     Effort: Pulmonary effort is normal.  Abdominal:     General: There is no distension.     Palpations: Abdomen is soft.     Tenderness: There is no abdominal tenderness. There is no rebound.  Musculoskeletal:        General: No swelling. Normal range of motion.  Skin:    General: Skin is warm and dry.     Findings: No rash.  Neurological:     Mental Status: She is alert and oriented to person, place, and  time.  Psychiatric:        Mood and Affect: Mood normal.        Behavior: Behavior normal.      GU / Detailed Urogynecologic Evaluation:  Pelvic Exam: Normal external female genitalia; Bartholin's and Skene's glands normal in appearance; urethral meatus normal in appearance, no urethral masses or discharge.   CST: negative  Speculum exam reveals normal vaginal   s/p hysterectomy: Speculum exam reveals normal vaginal mucosa with  atrophy and normal vaginal cuff.  Adnexa no mass, fullness, tenderness.   Fullness palpated along the length of the urethra.    Pelvic floor strength II/V  Pelvic floor musculature: Right levator non-tender, Right obturator non-tender, Left levator non-tender, Left obturator non-tender  POP-Q:   POP-Q  -3                                            Aa   -3                                           Ba  -6.5                                              C   4                                            Gh  4                                            Pb  7  tvl   0                                            Ap  0                                            Bp                                                 D      Rectal Exam:  Normal external rectum  Post-Void Residual (PVR) by Bladder Scan: In order to evaluate bladder emptying, we discussed obtaining a postvoid residual and she agreed to this procedure.  Procedure: The ultrasound unit was placed on the patient's abdomen in the suprapubic region after the patient had voided. A PVR of 90 ml was obtained by bladder scan.  Laboratory Results: Unable to provide urine sample  ASSESSMENT AND PLAN Ms. Butrum is a 57 y.o. with:  1. Urethral diverticulum   2. Suburethral cyst   3. Overactive bladder   4. Urinary frequency   5. SUI (stress urinary incontinence, female)    Urethral diverticulum - Pt reports she was told she had this but has not had  any prior imaging. Will obtain MRI of the pelvis w/wo contrast.  - Cyst not very visible, but there is a fullness appreciated along the urethra.  - Discussed may need to do a cystoscopy in office but will see MRI results first.  - Reviewed options of surgical intervention vs expectant management. Surgery would require prolonged catheterization and can increase incontinence.   2. OAB - We discussed the symptoms of overactive bladder (OAB), which include urinary urgency, urinary frequency, nocturia, with or without urge incontinence.  While we do not know the exact etiology of OAB, several treatment options exist. We discussed management including behavioral therapy (decreasing bladder irritants, urge suppression strategies, timed voids, bladder retraining), physical therapy, medication; for refractory cases posterior tibial nerve stimulation, sacral neuromodulation, and intravesical botulinum toxin injection.  - Prescribed trospium 60mg  ER Daily. For anticholinergic medications, we discussed the potential side effects of anticholinergics including dry eyes, dry mouth, constipation, cognitive impairment and urinary retention. - Emphasized that she needs to reduce intake of bladder irritants such as soda and coffee and drink more water.   3. SUI - not as bothersome - We did discuss that if she wanted to pursue surgical treatment, that she would be required to remove diverticulum first, then would plan for an interval sling procedure.   4. Stage II posterior prolapse - she is asymptomatoc, will expectantly manage at this time  Return after MRI  Marguerita Beards, MD

## 2023-04-19 ENCOUNTER — Encounter: Payer: Self-pay | Admitting: Obstetrics and Gynecology

## 2023-04-20 NOTE — Progress Notes (Unsigned)
[employer] was contacted re: update on MRI pre cert request. I spoke with Tiffany. She said she could not see there a submission was created on 04/19/2023.  I submitted another urgent MRI pre cert request with Tiffany.  Ref #- [id number] (Name date and time)  I was transferred to clinical decision where the nurse gave fax number to submit clinical notes. They were faxed to [phone].     Tracking number: [id number] ----  On 04/19/23 Kristina Reed was contacted to start a precert for MRI of the pelvis w/ and w/o contrast.The rep spoke to [place] Nurse' said there was no reference number because the system was down

## 2023-04-22 ENCOUNTER — Ambulatory Visit: Payer: Medicaid Other

## 2023-04-22 DIAGNOSIS — Z7689 Persons encountering health services in other specified circumstances: Secondary | ICD-10-CM | POA: Diagnosis not present

## 2023-04-23 ENCOUNTER — Other Ambulatory Visit: Payer: Self-pay | Admitting: Obstetrics and Gynecology

## 2023-04-23 DIAGNOSIS — R350 Frequency of micturition: Secondary | ICD-10-CM

## 2023-04-23 DIAGNOSIS — N3281 Overactive bladder: Secondary | ICD-10-CM

## 2023-04-23 MED ORDER — SOLIFENACIN SUCCINATE 5 MG PO TABS
5.0000 mg | ORAL_TABLET | Freq: Every day | ORAL | 5 refills | Status: DC
Start: 2023-04-23 — End: 2023-05-27

## 2023-04-23 NOTE — Progress Notes (Signed)
Patient has been informed.

## 2023-04-23 NOTE — Progress Notes (Unsigned)
Trospium coverage denied. Will order vesicare as alternative. Please inform patient.

## 2023-04-26 ENCOUNTER — Ambulatory Visit
Admission: RE | Admit: 2023-04-26 | Discharge: 2023-04-26 | Disposition: A | Discharge status: Home / Self Care | Payer: Medicaid Other | Source: Ambulatory Visit | Attending: Obstetrics and Gynecology | Admitting: Obstetrics and Gynecology

## 2023-04-26 DIAGNOSIS — N361 Urethral diverticulum: Secondary | ICD-10-CM | POA: Insufficient documentation

## 2023-04-26 DIAGNOSIS — K5730 Diverticulosis of large intestine without perforation or abscess without bleeding: Secondary | ICD-10-CM | POA: Diagnosis not present

## 2023-04-26 DIAGNOSIS — N368 Other specified disorders of urethra: Secondary | ICD-10-CM | POA: Diagnosis not present

## 2023-04-26 MED ORDER — GADOBUTROL 1 MMOL/ML IV SOLN
7.5000 mL | Freq: Once | INTRAVENOUS | Status: AC | PRN
  Administered 2023-04-26: 7.5 mL via INTRAVENOUS

## 2023-04-29 ENCOUNTER — Telehealth: Payer: Self-pay | Admitting: Internal Medicine

## 2023-04-29 NOTE — Telephone Encounter (Signed)
Left message to call back  

## 2023-04-29 NOTE — Telephone Encounter (Signed)
Patient called to follow-up on heart monitor results.

## 2023-04-30 ENCOUNTER — Telehealth: Payer: Self-pay | Admitting: Internal Medicine

## 2023-04-30 NOTE — Telephone Encounter (Signed)
Pt returning nurses call from yesterday regarding results. Please advise 

## 2023-04-30 NOTE — Telephone Encounter (Signed)
Spoke with patient and informed her of the following results from [employer] heart patch monitor as follows:  "Please let Kristina Reed know that her event monitor showed a few extra beats but no significant arrhythmias to explain her symptoms.  If she continues to have symptoms, she should follow-up as scheduled in September.  Otherwise, she can return to see [name] on an as-needed basis."  Patient stated she will keep appointment in September

## 2023-05-04 ENCOUNTER — Ambulatory Visit: Payer: Medicaid Other | Admitting: Nurse Practitioner

## 2023-05-04 NOTE — Progress Notes (Deleted)
   LMP 05/09/1993 (Approximate)    Subjective:    Patient ID: Kristina Reed, female    DOB: 04-02-1966, 57 y.o.   MRN: [id number]  HPI: Kristina Reed is a 57 y.o. female  No chief complaint on file.   Relevant past medical, surgical, family and social history reviewed and updated as indicated. Interim medical history since our last visit reviewed. Allergies and medications reviewed and updated.  Review of Systems Constitutional: Negative for fever or weight change.  Respiratory: Negative for cough and shortness of breath.   Cardiovascular: Negative for chest pain or palpitations.  Gastrointestinal: Negative for abdominal pain, no bowel changes.  Musculoskeletal: Negative for gait problem or joint swelling.  Skin: Negative for rash.  Neurological: Negative for dizziness or headache.  No other specific complaints in a complete review of systems (except as listed in HPI above).      Objective:    LMP 05/09/1993 (Approximate)   Wt Readings from Last 3 Encounters:  04/16/23 175 lb (79.4 kg)  04/15/23 174 lb 4.8 oz (79.1 kg)  03/26/23 170 lb 8 oz (77.3 kg)    Physical Exam  Constitutional: Patient appears well-developed and well-nourished.  No distress.  HEENT: head atraumatic, normocephalic, pupils equal and reactive to light, neck supple, throat within normal limits Cardiovascular: Normal rate, regular rhythm and normal heart sounds.  No murmur heard. No BLE edema. Pulmonary/Chest: Effort normal and breath sounds normal. No respiratory distress. Abdominal: Soft.  There is no tenderness. Psychiatric: Patient has a normal mood and affect. behavior is normal. Judgment and thought content normal.  Results for orders placed or performed in visit on 02/24/23  ECHOCARDIOGRAM COMPLETE  Result Value Ref Range   AR max vel 3.29 cm2   AV Peak grad 7.3 mmHg   Ao pk vel 1.35 m/s   S' Lateral 2.90 cm   Area-P 1/2 4.49 cm2   AV Area VTI 3.59 cm2   AV Mean grad 4.0 mmHg   Single Plane  A4C EF 67.6 %   Single Plane A2C EF 61.5 %   Calc EF 64.5 %   AV Area mean vel 3.32 cm2   Est EF 60 - 65%       Assessment & Plan:   Problem List Items Addressed This Visit   None    Follow up plan: No follow-ups on file.

## 2023-05-14 ENCOUNTER — Other Ambulatory Visit: Payer: Self-pay | Admitting: Gastroenterology

## 2023-05-14 ENCOUNTER — Ambulatory Visit
Admission: RE | Admit: 2023-05-14 | Discharge: 2023-05-14 | Disposition: A | Discharge status: Home / Self Care | Payer: Medicaid Other | Source: Ambulatory Visit | Attending: Obstetrics and Gynecology | Admitting: Obstetrics and Gynecology

## 2023-05-14 DIAGNOSIS — Z803 Family history of malignant neoplasm of breast: Secondary | ICD-10-CM

## 2023-05-14 DIAGNOSIS — Z1231 Encounter for screening mammogram for malignant neoplasm of breast: Secondary | ICD-10-CM | POA: Diagnosis not present

## 2023-05-16 DIAGNOSIS — Z419 Encounter for procedure for purposes other than remedying health state, unspecified: Secondary | ICD-10-CM | POA: Diagnosis not present

## 2023-05-18 ENCOUNTER — Encounter: Payer: Self-pay | Admitting: Obstetrics and Gynecology

## 2023-05-18 ENCOUNTER — Ambulatory Visit (INDEPENDENT_AMBULATORY_CARE_PROVIDER_SITE_OTHER): Payer: Medicaid Other | Admitting: Obstetrics and Gynecology

## 2023-05-18 VITALS — BP 121/86 | HR 91

## 2023-05-18 DIAGNOSIS — N393 Stress incontinence (female) (male): Secondary | ICD-10-CM | POA: Diagnosis not present

## 2023-05-18 DIAGNOSIS — N3281 Overactive bladder: Secondary | ICD-10-CM | POA: Diagnosis not present

## 2023-05-18 DIAGNOSIS — N361 Urethral diverticulum: Secondary | ICD-10-CM

## 2023-05-18 NOTE — Progress Notes (Signed)
[facility] Return Visit  SUBJECTIVE  History of Present Illness: Kristina Reed is a 57 y.o. female seen in follow-up for incontinence. Plan at last visit was to obtain MRI and start vesicare 5mg  daily (trospium ordered first but not covered).   She has stopped drinking caffeine and drinking more water. 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 No solid component or suspicious contrast enhancement.  Reviewed images and diverticulum appears circumferential.   Past Medical History: Patient  has a past medical history of Anxiety, Arthritis, Cancer (HCC), Cognitive impairment, mild, so stated, Depression, Diverticulosis, Eosinophilic esophagitis (09/03/2017), Esophageal dysphagia, Family history of breast cancer (05/2021), Gallstones, GERD (gastroesophageal reflux disease), History of cervical cancer, cervical cancer (01/30/2016), Hypertension, Hypothyroidism, Sleep apnea, Status post partial hysterectomy, and Vaginal inclusion cyst.   Past Surgical History: She  has a past surgical history that includes Vaginal hysterectomy; Colonoscopy with propofol (N/A, 06/27/2015); Tubal ligation; Knee arthroscopy (Right, 07/22/2016); Esophagogastroduodenoscopy (egd) with propofol (N/A, 07/22/2017); Colonoscopy with propofol (N/A, 09/23/2017); Knee arthroscopy with medial menisectomy (Left, 11/10/2017); Esophagogastroduodenoscopy (egd) with propofol (N/A, 03/31/2018); Breast biopsy (Bilateral, 01/01/2016); Colonoscopy with propofol (N/A, 06/09/2021); Esophagogastroduodenoscopy (egd) with propofol (N/A, 08/25/2021); Total knee arthroplasty (Right, 11/24/2021); and Esophagogastroduodenoscopy (egd) with propofol (N/A, 11/20/2022).   Medications: She has a current medication list which includes the following prescription(s): amitriptyline, estradiol, fluoxetine, levothyroxine, lisinopril, meloxicam, omeprazole, and solifenacin.    Allergies: Patient is allergic to prednisone, methylprednisolone, and percocet [oxycodone-acetaminophen].   Social History: Patient  reports that she has never smoked. She has never been exposed to tobacco smoke. She has never used smokeless tobacco. She reports that she does not currently use alcohol. She reports that she does not use drugs.      OBJECTIVE     Physical Exam: Vitals:   05/18/23 1336  BP: 121/86  Pulse: 91   Gen: No apparent distress, A&O x 3.  Detailed Urogynecologic Evaluation:  Deferred. Prior exam showed:  s/p hysterectomy: Speculum exam reveals normal vaginal mucosa with  atrophy and normal vaginal cuff.  Adnexa no mass, fullness, tenderness.   Fullness palpated along the length of the urethra.    ASSESSMENT AND PLAN    Kristina Reed is a 57 y.o. with:  1. Urethral diverticulum   2. SUI (stress urinary incontinence, female)   3. Overactive bladder    - She is interested in improving her leakage. We reviewed the results of the MRI and explained urethral diverticulum. We reviewed surgical excision and need for urethral reconstruction and prolonged catheter use.  - We also reviewed options of staged sling for SUI vs concurrent rectus fascial sling. She is interested in the fascial sling.   - Plan for surgery: Exam under anesthesia, urethral diverticulectomy, cystoscopy, rectus fascial sling  - We reviewed the patient's specific anatomic and functional findings, with the assistance of diagrams, and together finalized the above procedure. The planned surgical procedures were discussed along with the surgical risks outlined below, which were also provided on a detailed handout. Additional treatment options including expectant management, conservative management, medical management were discussed where appropriate.  We reviewed the benefits and risks of each treatment option.   General Surgical Risks: For all procedures, there are risks of bleeding, infection,  damage to surrounding organs including but not limited to bowel, bladder, blood vessels, ureters and nerves, and need for further surgery if an injury were to occur. These risks are all low with minimally  invasive surgery.   There are risks of numbness and weakness at any body site or buttock/rectal pain.  It is possible that baseline pain can be worsened by surgery, either with or without mesh. If surgery is vaginal, there is also a low risk of possible conversion to laparoscopy or open abdominal incision where indicated. Very rare risks include blood transfusion, blood clot, heart attack, pneumonia, or death.   There is also a risk of short-term postoperative urinary retention with need to use a catheter. She will have a catheter for approximately 2 weeks. We reviewed the risk of worsening overactive bladder and need for possible treatment after surgery.   - For preop Visit:  She is required to have a visit within 30 days of her surgery.    - Medical clearance: not required  - Anticoagulant use: No - Medicaid Hysterectomy form: n/a - Accepts blood transfusion: Yes - Expected length of stay: outpatient  Request sent for surgery scheduling.   Marguerita Beards, MD

## 2023-05-18 NOTE — Patient Instructions (Addendum)
Plan for surgery: Removal of urethral diverticulum (cyst on the urethra), sling under the urethra made of your own tissue from the abdomen (fascial sling), cystoscopy (looking into the bladder with a camera)   We discussed risks of bleeding, infection, damage to surrounding organs including bowel, bladder, blood vessels, ureters and nerves, need for further surgery, numbness and weakness at any body site,  and the rarer risks of blood clot, heart attack, pneumonia, death.   Surgery will require a catheter for about 2 weeks.   No heavy lifting >10 lbs for 6 weeks.

## 2023-05-18 NOTE — Telephone Encounter (Signed)
Last office visit 11/16/2022 Erosive esophagitis  Last refill 03/10/2023 0 refills  No appointment is schedule

## 2023-05-24 NOTE — Progress Notes (Unsigned)
   Acute Office Visit  Subjective:     Patient ID: Kristina Reed, female    DOB: 05-01-1966, 57 y.o.   MRN: [id number]  No chief complaint on file.   HPI Patient is in today for left foot pain.  FOOT PAIN Duration: {Blank single:19197::"chronic","days","weeks","months"} Involved foot: {Blank single:19197::"left","right","bilateral"} Mechanism of injury: {Blank single:19197::"trauma","unknown"} Location:  Onset: {Blank single:19197::"sudden","gradual"}  Severity: {Blank single:19197::"mild","moderate","severe","1/10","2/10","3/10","4/10","5/10","6/10","7/10","8/10","9/10","10/10"}  Quality:  {Blank multiple:19196::"sharp","dull","aching","burning","cramping","ill-defined","itchy","pressure-like","pulling","shooting","sore","stabbing","tender","tearing","throbbing"} Frequency: {Blank single:19197::"constant","intermittent","occasional","rare","every few minutes","a few times a hour","a few times a day","a few times a week","a few times a month","a few times a year"} Radiation: {Blank single:19197::"yes","no"} Aggravating factors: {Blank multiple:19196::"weight bearing","walking","running","stairs","bending","movement","prolonged sitting"}  Alleviating factors: {Blank multiple:19196::"nothing","ice","physical therapy","HEP","APAP","NSAIDs","brace","crutches","rest"}  Status: {Blank multiple:19196::"better","worse","stable","fluctuating"} Treatments attempted: {Blank multiple:19196::"none","rest","ice","heat","APAP","ibuprofen","aleve","physical therapy","HEP"}  Relief with NSAIDs?:  {Blank single:19197::"No NSAIDs Taken","no","mild","moderate","significant"} Weakness with weight bearing or walking: {Blank single:19197::"yes","no"} Morning stiffness: {Blank single:19197::"yes","no"} Swelling: {Blank single:19197::"yes","no"} Redness: {Blank single:19197::"yes","no"} Bruising: {Blank single:19197::"yes","no"} Paresthesias / decreased sensation: {Blank single:19197::"yes","no"}   Fevers:{Blank single:19197::"yes","no"}   ROS      Objective:    LMP 05/09/1993 (Approximate)  {Vitals History (Optional):23777}  Physical Exam  No results found for any visits on 05/25/23.      Assessment & Plan:   Problem List Items Addressed This Visit   None   No orders of the defined types were placed in this encounter.   No follow-ups on file.  Margarita Mail, DO

## 2023-05-25 ENCOUNTER — Ambulatory Visit (INDEPENDENT_AMBULATORY_CARE_PROVIDER_SITE_OTHER): Payer: Medicaid Other | Admitting: Internal Medicine

## 2023-05-25 ENCOUNTER — Encounter: Payer: Self-pay | Admitting: Internal Medicine

## 2023-05-25 VITALS — BP 116/80 | HR 98 | Temp 97.8°F | Ht 61.5 in | Wt 176.2 lb

## 2023-05-25 DIAGNOSIS — M79672 Pain in left foot: Secondary | ICD-10-CM

## 2023-05-25 DIAGNOSIS — Z7689 Persons encountering health services in other specified circumstances: Secondary | ICD-10-CM | POA: Diagnosis not present

## 2023-05-27 ENCOUNTER — Encounter: Payer: Self-pay | Admitting: Internal Medicine

## 2023-05-27 ENCOUNTER — Ambulatory Visit: Payer: Medicaid Other | Attending: Internal Medicine | Admitting: Internal Medicine

## 2023-05-27 VITALS — BP 148/100 | HR 85 | Ht 61.5 in | Wt 173.8 lb

## 2023-05-27 DIAGNOSIS — I491 Atrial premature depolarization: Secondary | ICD-10-CM

## 2023-05-27 DIAGNOSIS — I10 Essential (primary) hypertension: Secondary | ICD-10-CM | POA: Diagnosis not present

## 2023-05-27 DIAGNOSIS — I493 Ventricular premature depolarization: Secondary | ICD-10-CM | POA: Insufficient documentation

## 2023-05-27 DIAGNOSIS — R0609 Other forms of dyspnea: Secondary | ICD-10-CM | POA: Diagnosis not present

## 2023-05-27 DIAGNOSIS — Z7689 Persons encountering health services in other specified circumstances: Secondary | ICD-10-CM | POA: Diagnosis not present

## 2023-05-27 MED ORDER — METOPROLOL SUCCINATE ER 25 MG PO TB24: 25.0000 mg | ORAL_TABLET | Freq: Every day | ORAL | 3 refills | Status: DC

## 2023-05-27 NOTE — Patient Instructions (Signed)
Medication Instructions:  Your physician recommends the following medication changes.  START TAKING: Metoprolol Succinate 25 mg by mouth daily  *If you need a refill on your cardiac medications before your next appointment, please call your pharmacy*   Lab Work: No labs ordered today    Testing/Procedures: No test ordered today    Follow-Up: At [facility], you and your health needs are our priority.  As part of our continuing mission to provide you with exceptional heart care, we have created designated Provider Care Teams.  These Care Teams include your primary Cardiologist (physician) and Advanced Practice Providers (APPs -  Physician Assistants and Nurse Practitioners) who all work together to provide you with the care you need, when you need it.  We recommend signing up for the patient portal called "MyChart".  Sign up information is provided on this After Visit Summary.  MyChart is used to connect with patients for Virtual Visits (Telemedicine).  Patients are able to view lab/test results, encounter notes, upcoming appointments, etc.  Non-urgent messages can be sent to your provider as well.   To learn more about what you can do with MyChart, go to [email or web address].    Your next appointment:   1 month(s)  Provider:   You will see one of the following Advanced Practice Providers on your designated Care Team:   Nicolasa Ducking, NP Eula Listen, PA-C Cadence Fransico Michael, PA-C Charlsie Quest, NP

## 2023-05-27 NOTE — Progress Notes (Signed)
Cardiology Office Note:  .   Date:  05/27/2023  ID:  [name], DOB 10/20/1965, MRN [id number] PCP: Margarita Mail, DO  [facility] Providers Cardiologist:  Yvonne Kendall, MD     History of Present Illness: .   Kristina Reed is a 57 y.o. female with history of hypertension, hypothyroidism, eosinophilic esophagitis, and cervical cancer, who presents for follow-up of palpitations.  I last saw her in June, at which time she was feeling well other than occasional palpitations with associated lightheadedness.  Previously reported chest pain and shortness of breath had been quiescent.  We agreed to obtain a 14-day event monitor, which showed predominantly sinus rhythm with rare PACs and PVCs.  Today, Ms. Mathieson reports that she is feeling fairly well though she still has some intermittent shortness of breath that happens with mild activity such as cleaning up around the house.  She also has intermittent palpitations with vague discomfort in the chest when they occur.  She has not had chest pain at other times.  She denies lightheadedness and edema.  ROS: See HPI  Studies Reviewed: Marland Kitchen        EP Procedures and Devices: 14-day event monitor (03/08/2023): Predominantly sinus rhythm with rare PACs and PVCs.  No significant arrhythmia identified.   Non-Invasive Evaluation(s): TTE (02/24/2023): Normal LV size and wall thickness.  LVEF 60-65% with normal wall motion and diastolic function.  Normal RV size and function.  Normal biatrial size.  No significant valvular abnormalities.  Normal CVP. Coronary CTA (02/15/2023): Normal coronary arteries without stenosis or calcification.  No significant extracardiac findings in the visualized chest. TTE (01/31/2019): Normal LV size and wall thickness.  LVEF 55-6% with normal wall motion and diastolic function.  Normal RV size and function.  Normal biatrial size.  No significant valvular abnormalities.  Risk Assessment/Calculations:     HYPERTENSION  CONTROL Vitals:   05/27/23 1013 05/27/23 1017 05/27/23 1025  BP: (!) 138/98 (!) 140/98 (!) 148/100    The patient's blood pressure is elevated above target today.  In order to address the patient's elevated BP: A new medication was prescribed today.          Physical Exam:   VS:  BP (!) 148/100   Pulse 85   Ht 5' 1.5" (1.562 m)   Wt 173 lb 12.8 oz (78.8 kg)   LMP 05/09/1993 (Approximate)   SpO2 97%   BMI 32.31 kg/m    Wt Readings from Last 3 Encounters:  05/27/23 173 lb 12.8 oz (78.8 kg)  05/25/23 176 lb 3.2 oz (79.9 kg)  04/16/23 175 lb (79.4 kg)    General:  NAD. Neck: No JVD or HJR. Lungs: Clear to auscultation bilaterally without wheezes or crackles. Heart: Regular rate and rhythm without murmurs, rubs, or gallops. Abdomen: Soft, nontender, nondistended. Extremities: No lower extremity edema.  Bilateral forearm abrasions with mild erythema.  ASSESSMENT AND PLAN: .    Dyspnea on exertion:  I suspect this could be due to deconditioning; underlying pulmonary pathology also concerned though the patient does not have a history of tobacco use.  Cardiac workup thus far has been unrevealing with negative coronary CTA and echocardiogram.  Event monitor showed rare PACs and PVCs but no significant arrhythmia.  We have agreed to add metoprolol succinate 25 mg daily to help lower her heart rate, suppress ectopy, and improve blood pressure.  If symptoms do not improve, transition from lisinopril to an ARB +/- consultation with pulmonology will need to be  considered.  PACs and PVCs: Sporadic palpitations still reported.  I reassured Ms. Papa that no significant arrhythmia was identified on her recent monitor.  We have agreed to a trial of metoprolol succinate 25 mg daily.  Hypertension: Blood pressure suboptimally controlled today.  We will add metoprolol succinate 25 mg daily.  We also discussed escalation of lisinopril but have agreed to defer this for now.  Patient may benefit from  transitioning from lisinopril to an ARB if her dyspnea persists as well.    Dispo: RTC 1 month with APP  Signed, Yvonne Kendall, MD

## 2023-05-31 ENCOUNTER — Encounter: Payer: Self-pay | Admitting: *Deleted

## 2023-05-31 ENCOUNTER — Telehealth: Payer: Self-pay | Admitting: Obstetrics and Gynecology

## 2023-05-31 ENCOUNTER — Other Ambulatory Visit: Payer: Self-pay

## 2023-05-31 ENCOUNTER — Emergency Department
Admission: EM | Admit: 2023-05-31 | Discharge: 2023-05-31 | Disposition: A | Discharge status: Home / Self Care | Payer: Medicaid Other | Attending: Emergency Medicine | Admitting: Emergency Medicine

## 2023-05-31 DIAGNOSIS — R339 Retention of urine, unspecified: Secondary | ICD-10-CM | POA: Diagnosis present

## 2023-05-31 DIAGNOSIS — N390 Urinary tract infection, site not specified: Secondary | ICD-10-CM | POA: Insufficient documentation

## 2023-05-31 DIAGNOSIS — I10 Essential (primary) hypertension: Secondary | ICD-10-CM | POA: Diagnosis not present

## 2023-05-31 LAB — CBC
HCT: 39.2 % (ref 36.0–46.0)
Hemoglobin: 12.8 g/dL (ref 12.0–15.0)
MCH: 28.1 pg (ref 26.0–34.0)
MCHC: 32.7 g/dL (ref 30.0–36.0)
MCV: 86 fL (ref 80.0–100.0)
Platelets: 248 10*3/uL (ref 150–400)
RBC: 4.56 MIL/uL (ref 3.87–5.11)
RDW: 12.9 % (ref 11.5–15.5)
WBC: 7.8 10*3/uL (ref 4.0–10.5)
nRBC: 0 % (ref 0.0–0.2)

## 2023-05-31 LAB — BASIC METABOLIC PANEL
Anion gap: 9 (ref 5–15)
BUN: 14 mg/dL (ref 6–20)
CO2: 28 mmol/L (ref 22–32)
Calcium: 9.1 mg/dL (ref 8.9–10.3)
Chloride: 101 mmol/L (ref 98–111)
Creatinine, Ser: 0.84 mg/dL (ref 0.44–1.00)
GFR, Estimated: >60 mL/min (ref >60)
Glucose, Bld: 88 mg/dL (ref 70–99)
Potassium: 4 mmol/L (ref 3.5–5.1)
Sodium: 138 mmol/L (ref 135–145)

## 2023-05-31 LAB — URINALYSIS, ROUTINE W REFLEX MICROSCOPIC
Bilirubin Urine: NEGATIVE
Glucose, UA: NEGATIVE mg/dL
Ketones, ur: NEGATIVE mg/dL
Nitrite: NEGATIVE
Protein, ur: NEGATIVE mg/dL
Specific Gravity, Urine: 1.015 (ref 1.005–1.030)
WBC, UA: >50 WBC/hpf (ref 0–5)
pH: 5 (ref 5.0–8.0)

## 2023-05-31 MED ORDER — SODIUM CHLORIDE 0.9 % IV BOLUS
1000.0000 mL | Freq: Once | INTRAVENOUS | Status: AC
  Administered 2023-05-31: 1000 mL via INTRAVENOUS

## 2023-05-31 MED ORDER — CEPHALEXIN 500 MG PO CAPS
500.0000 mg | ORAL_CAPSULE | Freq: Once | ORAL | Status: AC
  Administered 2023-05-31: 500 mg via ORAL
  Filled 2023-05-31: qty 1

## 2023-05-31 MED ORDER — CEPHALEXIN 500 MG PO CAPS: 500.0000 mg | ORAL_CAPSULE | Freq: Three times a day (TID) | ORAL | 0 refills | Status: DC

## 2023-05-31 NOTE — ED Triage Notes (Signed)
Pt reports that she has not been able to void since yesterday afternoon.  Pt has been having difficulty urinating for years and is waiting to have a bladder sling done.

## 2023-05-31 NOTE — ED Provider Notes (Signed)
[facility] Provider Note    Event Date/Time   First MD Initiated Contact with Patient 05/31/23 1503     (approximate)  History   Chief Complaint: Urinary Retention  HPI  Kristina Reed is a 57 y.o. female with a past medical history of anxiety, gastric reflux, hypertension, presents to the emergency department for urinary retention.  Patient states this has been an intermittent issue over the past 8 years which she will intermittently have urinary retention requiring a catheter.  States she was able to last urinate yesterday and has not been able to urinate today.  Denies any abdominal pain.  Denies any dysuria.  Patient states she is seeing a urologist for this.  Physical Exam   Triage Vital Signs: ED Triage Vitals  Encounter Vitals Group     BP --      Systolic BP Percentile --      Diastolic BP Percentile --      Pulse --      Resp --      Temp --      Temp src --      SpO2 --      Weight 05/31/23 1522 173 lb 11.6 oz (78.8 kg)     Height 05/31/23 1522 5' 1.5" (1.562 m)     Head Circumference --      Peak Flow --      Pain Score 05/31/23 1330 10     Pain Loc --      Pain Education --      Exclude from Growth Chart --     Most recent vital signs: There were no vitals filed for this visit.  General: Awake, no distress.  CV:  Good peripheral perfusion.  Regular rate and rhythm  Resp:  Normal effort.  Equal breath sounds bilaterally.  Abd:  No distention.  Soft, nontender.  No rebound or guarding.  No lower abdominal tenderness or fullness.  ED Results / Procedures  Treatments   MEDICATIONS ORDERED IN ED: Medications  sodium chloride 0.9 % bolus 1,000 mL (has no administration in time range)     IMPRESSION / MDM / ASSESSMENT AND PLAN / ED COURSE  I reviewed the triage vital signs and the nursing notes.  Patient's presentation is most consistent with acute presentation with potential threat to life or bodily function.  Patient presents  to the emergency department for urinary retention.  States this has been an intermittent issue over the past 8 years but she has not been able to urinate today.  Patient has no lower abdominal fullness or tenderness.  Lab work shows a reassuring CBC and normal chemistry with normal renal function.  Bladder scan shows approximately 100 cc of urine.  We will place an IV, IV hydrate.  We will ensure that the patient is able to urinate and check a urine sample to ensure no urinary tract infection.  If the patient is unable to urinate we will consider catheter placement with outpatient urology follow-up.  After IV fluids repeat bladder scan shows 284 cc.  Patient was able to urinate.  Urine sample shows greater than 50 white cells with many bacteria consistent with urinary tract infection.  Will start the patient on Keflex 3 times daily for the next 10 days.  Patient agreeable to plan of care.  Urine culture has been sent.  FINAL CLINICAL IMPRESSION(S) / ED DIAGNOSES   Urinary infection   Note:  This document was prepared using Dragon  voice recognition software and may include unintentional dictation errors.   Minna Antis, MD 05/31/23 [phone]

## 2023-05-31 NOTE — Telephone Encounter (Signed)
Pt called in saying she can not urinate.  She has not urinated since yesterday.  Told her to come into the office.  She said her boyfriend had an appt this afternoon and she didn't have the gas money to come to [place].  Per provider, advised patient to go to urgent care or ER which ever is closer for her.  She understood.

## 2023-06-02 DIAGNOSIS — Z7689 Persons encountering health services in other specified circumstances: Secondary | ICD-10-CM | POA: Diagnosis not present

## 2023-06-02 DIAGNOSIS — B351 Tinea unguium: Secondary | ICD-10-CM | POA: Diagnosis not present

## 2023-06-02 DIAGNOSIS — M79674 Pain in right toe(s): Secondary | ICD-10-CM | POA: Diagnosis not present

## 2023-06-02 DIAGNOSIS — M79675 Pain in left toe(s): Secondary | ICD-10-CM | POA: Diagnosis not present

## 2023-06-03 NOTE — Progress Notes (Signed)
BP 124/76   Pulse 98   Temp 97.9 F (36.6 C) (Oral)   Resp 16   Ht 5' 1.5" (1.562 m)   Wt 175 lb 9.6 oz (79.7 kg)   LMP 05/09/1993 (Approximate)   SpO2 98%   BMI 32.64 kg/m    Subjective:    Patient ID: Kristina Reed, female    DOB: 1965/10/03, 57 y.o.   MRN: [id number]  HPI: Kristina Reed is a 57 y.o. female  Chief Complaint  Patient presents with   Follow-up    ER visit   Er follow up/UTI/urinary retention: patient was seen at [facility] on 05/31/2023 for urinary retention.  ER note: Patient presents to the emergency department for urinary retention.  States this has been an intermittent issue over the past 8 years but she has not been able to urinate today.  Patient has no lower abdominal fullness or tenderness.  Lab work shows a reassuring CBC and normal chemistry with normal renal function.  Bladder scan shows approximately 100 cc of urine.  We will place an IV, IV hydrate.  We will ensure that the patient is able to urinate and check a urine sample to ensure no urinary tract infection.  If the patient is unable to urinate we will consider catheter placement with outpatient urology follow-up.   After IV fluids repeat bladder scan shows 284 cc.  Patient was able to urinate.  Urine sample shows greater than 50 white cells with many bacteria consistent with urinary tract infection.  Will start the patient on Keflex 3 times daily for the next 10 days.  Patient agreeable to plan of care.  Urine culture has been sent. Urine culture resulted and was inconclusive, recollection was suggested. Will send off for another urine culture.   Patient reports she has been taking keflex as directed.  She reports she is improving.  She denies any fever or back pain. She reports she is able to urinate now.  Recommend patient follow up with urologist.  She is established with Urog[facility], Dr. Florian Buff. Patient reports she is going to call them and let them know she was seen in the er for urinary retention and  uti.  Relevant past medical, surgical, family and social history reviewed and updated as indicated. Interim medical history since our last visit reviewed. Allergies and medications reviewed and updated.  Review of Systems  Constitutional: Negative for fever or weight change.  Respiratory: Negative for cough and shortness of breath.   Cardiovascular: Negative for chest pain or palpitations.  Gastrointestinal: Negative for abdominal pain, no bowel changes.  Musculoskeletal: Negative for gait problem or joint swelling.  Skin: Negative for rash.  Neurological: Negative for dizziness or headache.  No other specific complaints in a complete review of systems (except as listed in HPI above).      Objective:    BP 124/76   Pulse 98   Temp 97.9 F (36.6 C) (Oral)   Resp 16   Ht 5' 1.5" (1.562 m)   Wt 175 lb 9.6 oz (79.7 kg)   LMP 05/09/1993 (Approximate)   SpO2 98%   BMI 32.64 kg/m   Wt Readings from Last 3 Encounters:  06/04/23 175 lb 9.6 oz (79.7 kg)  05/31/23 173 lb 11.6 oz (78.8 kg)  05/27/23 173 lb 12.8 oz (78.8 kg)    Physical Exam  Constitutional: Patient appears well-developed and well-nourished. Obese  No distress.  HEENT: head atraumatic, normocephalic, pupils equal and reactive to light, neck supple  Cardiovascular: Normal rate, regular rhythm and normal heart sounds.  No murmur heard. No BLE edema. Pulmonary/Chest: Effort normal and breath sounds normal. No respiratory distress. Abdominal: Soft.  There is no tenderness. No CVA tenderness Psychiatric: Patient has a normal mood and affect. behavior is normal. Judgment and thought content normal.  Results for orders placed or performed during the hospital encounter of 05/31/23  Urine Culture   Specimen: Urine, Clean Catch  Result Value Ref Range   Specimen Description      URINE, CLEAN CATCH Performed at [facility], [address] [place], [place] [address]    Special Requests      NONE Performed at  [facility], [address] [place], [place] [address]    Culture MULTIPLE SPECIES PRESENT, SUGGEST RECOLLECTION (A)    Report Status 06/02/2023 FINAL   Urinalysis, Routine w reflex microscopic -Urine, Clean Catch  Result Value Ref Range   Color, Urine YELLOW (A) YELLOW   APPearance CLOUDY (A) CLEAR   Specific Gravity, Urine 1.015 1.005 - 1.030   pH 5.0 5.0 - 8.0   Glucose, UA NEGATIVE NEGATIVE mg/dL   Hgb urine dipstick SMALL (A) NEGATIVE   Bilirubin Urine NEGATIVE NEGATIVE   Ketones, ur NEGATIVE NEGATIVE mg/dL   Protein, ur NEGATIVE NEGATIVE mg/dL   Nitrite NEGATIVE NEGATIVE   Leukocytes,Ua LARGE (A) NEGATIVE   RBC / HPF 6-10 0 - 5 RBC/hpf   WBC, UA >50 0 - 5 WBC/hpf   Bacteria, UA MANY (A) NONE SEEN   Squamous Epithelial / HPF 21-50 0 - 5 /HPF   Mucus PRESENT   Basic metabolic panel  Result Value Ref Range   Sodium 138 135 - 145 mmol/L   Potassium 4.0 3.5 - 5.1 mmol/L   Chloride 101 98 - 111 mmol/L   CO2 28 22 - 32 mmol/L   Glucose, Bld 88 70 - 99 mg/dL   BUN 14 6 - 20 mg/dL   Creatinine, Ser 0.98 0.44 - 1.00 mg/dL   Calcium 9.1 8.9 - 11.9 mg/dL   GFR, Estimated >14 >78 mL/min   Anion gap 9 5 - 15  CBC  Result Value Ref Range   WBC 7.8 4.0 - 10.5 K/uL   RBC 4.56 3.87 - 5.11 MIL/uL   Hemoglobin 12.8 12.0 - 15.0 g/dL   HCT 29.5 62.1 - 30.8 %   MCV 86.0 80.0 - 100.0 fL   MCH 28.1 26.0 - 34.0 pg   MCHC 32.7 30.0 - 36.0 g/dL   RDW 65.7 84.6 - 96.2 %   Platelets 248 150 - 400 K/uL   nRBC 0.0 0.0 - 0.2 %      Assessment & Plan:   Problem List Items Addressed This Visit   None Visit Diagnoses     Acute cystitis without hematuria    -  Primary   continue keflex, sent urine for culture, follow up with Urogyn.   Relevant Orders   Urine Culture   Urinary retention       continue keflex, sent urine for culture, follow up with Urogyn.        Follow up plan: Return if symptoms worsen or fail to improve, for follow up with Urogyn.

## 2023-06-04 ENCOUNTER — Other Ambulatory Visit: Payer: Self-pay

## 2023-06-04 ENCOUNTER — Ambulatory Visit (INDEPENDENT_AMBULATORY_CARE_PROVIDER_SITE_OTHER): Payer: Medicaid Other | Admitting: Nurse Practitioner

## 2023-06-04 ENCOUNTER — Encounter: Payer: Self-pay | Admitting: Nurse Practitioner

## 2023-06-04 VITALS — BP 124/76 | HR 98 | Temp 97.9°F | Resp 16 | Ht 61.5 in | Wt 175.6 lb

## 2023-06-04 DIAGNOSIS — R339 Retention of urine, unspecified: Secondary | ICD-10-CM

## 2023-06-04 DIAGNOSIS — N3000 Acute cystitis without hematuria: Secondary | ICD-10-CM | POA: Diagnosis not present

## 2023-06-04 DIAGNOSIS — Z7689 Persons encountering health services in other specified circumstances: Secondary | ICD-10-CM | POA: Diagnosis not present

## 2023-06-06 LAB — URINE CULTURE
MICRO NUMBER:: 15496468
SPECIMEN QUALITY:: ADEQUATE

## 2023-06-07 ENCOUNTER — Other Ambulatory Visit: Payer: Self-pay | Admitting: Nurse Practitioner

## 2023-06-07 DIAGNOSIS — N3000 Acute cystitis without hematuria: Secondary | ICD-10-CM

## 2023-06-07 MED ORDER — FLUCONAZOLE 100 MG PO TABS
200.0000 mg | ORAL_TABLET | Freq: Every day | ORAL | 0 refills | Status: DC
Start: 2023-06-07 — End: 2023-11-24

## 2023-06-08 ENCOUNTER — Ambulatory Visit (INDEPENDENT_AMBULATORY_CARE_PROVIDER_SITE_OTHER): Payer: Medicaid Other | Admitting: Physician Assistant

## 2023-06-08 ENCOUNTER — Encounter: Payer: Self-pay | Admitting: Physician Assistant

## 2023-06-08 VITALS — BP 114/62 | HR 84 | Temp 98.5°F | Resp 16 | Ht 61.5 in | Wt 177.9 lb

## 2023-06-08 DIAGNOSIS — H00016 Hordeolum externum left eye, unspecified eyelid: Secondary | ICD-10-CM | POA: Diagnosis not present

## 2023-06-08 DIAGNOSIS — R413 Other amnesia: Secondary | ICD-10-CM

## 2023-06-08 NOTE — Progress Notes (Signed)
Acute Office Visit   Patient: Kristina Reed   DOB: March 28, 1966   57 y.o. Female  MRN: [id number] Visit Date: 06/08/2023  Today's healthcare provider: Oswaldo Conroy Purl Claytor, PA-C  Introduced myself to the patient as a Secondary school teacher and provided education on APPs in clinical practice.    Chief Complaint  Patient presents with   Eye Pain    Left eye started with pain today, its red and it looks shrinked   Subjective    HPI HPI     Eye Pain    Additional comments: Left eye started with pain today, its red and it looks shrinked      Last edited by Forde Radon, CMA on 06/08/2023  2:09 PM.      Left eye pain  Onset: sudden - started this AM  Duration: started today  Associated symptoms: She reports her left eye does hurt and itch, reports pain on both upper and lower lids  She denies blurry vision or double vision, photophobia  Interventions: nothing  Contact lens use: does not use contacts  Ocular injury or contamination: none to her knowledge  Recent illness: she denies recent illness    Medications: Outpatient Medications Prior to Visit  Medication Sig   amitriptyline (ELAVIL) 25 MG tablet TAKE 1 TABLET BY MOUTH AT BEDTIME   cephALEXin (KEFLEX) 500 MG capsule Take 1 capsule (500 mg total) by mouth 3 (three) times daily.   estradiol (ESTRACE) 0.5 MG tablet Take 1 tablet (0.5 mg total) by mouth daily.   fluconazole (DIFLUCAN) 100 MG tablet Take 2 tablets (200 mg total) by mouth daily.   FLUoxetine (PROZAC) 20 MG capsule Take 1 capsule (20 mg total) by mouth daily.   levothyroxine (SYNTHROID) 75 MCG tablet Take 1 tablet (75 mcg total) by mouth daily before breakfast.   lisinopril (ZESTRIL) 5 MG tablet Take 1 tablet by mouth once daily   meloxicam (MOBIC) 15 MG tablet Take 15 mg by mouth daily.   metoprolol succinate (TOPROL-XL) 25 MG 24 hr tablet Take 1 tablet (25 mg total) by mouth daily. Take with or immediately following a meal.   omeprazole (PRILOSEC) 40 MG  capsule Take 1 capsule (40 mg total) by mouth 2 (two) times daily before a meal.   No facility-administered medications prior to visit.    Review of Systems  Constitutional:  Negative for chills and fever.  HENT:  Negative for congestion, postnasal drip and rhinorrhea.   Eyes:  Positive for pain, discharge, redness and itching. Negative for visual disturbance.        Objective    BP 114/62   Pulse 84   Temp 98.5 F (36.9 C) (Oral)   Resp 16   Ht 5' 1.5" (1.562 m)   Wt 177 lb 14.4 oz (80.7 kg)   LMP 05/09/1993 (Approximate)   SpO2 98%   BMI 33.07 kg/m     Physical Exam Vitals reviewed.  Constitutional:      General: She is awake.     Appearance: Normal appearance. She is well-developed and well-groomed.  HENT:     Head: Normocephalic and atraumatic.  Eyes:     General: Gaze aligned appropriately. No allergic shiner or scleral icterus.       Right eye: No discharge or hordeolum.        Left eye: Hordeolum present.No foreign body or discharge.     Extraocular Movements: Extraocular movements intact.     Conjunctiva/sclera: Conjunctivae normal.  Pupils: Pupils are equal, round, and reactive to light.  Musculoskeletal:     Cervical back: Normal range of motion.  Neurological:     Mental Status: She is alert.  Psychiatric:        Behavior: Behavior is cooperative.       No results found for any visits on 06/08/23.  Assessment & Plan      No follow-ups on file.      Problem List Items Addressed This Visit   None Visit Diagnoses     Hordeolum externum of left eye, unspecified eyelid    -  Primary Acute, new concern Patient presents with swelling of upper left eyelid that appears consistent with hordeolum.  She also has some mild swelling on left lower lid which may also represent external hordeolum that is resolving. Recommend warm compresses with damp cloth as well as lubricating eyedrops as needed to assist with symptom management Reviewed etiology of  hordeolum and recommended treatment regimen with her Follow up as needed for persistent or progressing symptoms     Memory changes     Patient reports that she had a visit with her PCP in February where she had Mini-Mental status exam and was referred to neurology for workup but nothing came of this. Reviewed referrals history which demonstrates that her neurology referral was declined by [facility] due to patient age.  Patient would still like to be worked up as she reports that she is forgetful and there appears to be family history of dementia New referral placed per request Follow up as needed for persistent or progressing symptoms     Relevant Orders   Ambulatory referral to Neurology        No follow-ups on file.   I, Jayse Hodkinson E Suvi Archuletta, PA-C, have reviewed all documentation for this visit. The documentation on 06/08/23 for the exam, diagnosis, procedures, and orders are all accurate and complete.   Jacquelin Hawking, MHS, PA-C [facility]

## 2023-06-08 NOTE — Patient Instructions (Signed)
It looks like you may have a stye on your left eye   You can use sterile eye flushes and lubricating eye drops (Blink tears or Rephresh) to assist with eye irritation and further resolution You can also use a warm compress over the eye to assist with swelling   If you have used makeup or mascara on that eye I recommend discarding it as this can cause irritation.   Thoroughly wash any makeup brushes and avoid using makeup while recovering  If you notice the following please return to the office: lack of improvement, eyelid swelling, increased eye irritation  If you notice the following please go to the ED: eye pressure causing displacement of the eye, vision changes, increased eye pain or foreign body sensation, fever

## 2023-06-11 ENCOUNTER — Ambulatory Visit: Payer: Medicaid Other

## 2023-06-11 DIAGNOSIS — Z719 Counseling, unspecified: Secondary | ICD-10-CM

## 2023-06-11 DIAGNOSIS — Z23 Encounter for immunization: Secondary | ICD-10-CM | POA: Diagnosis not present

## 2023-06-11 NOTE — Progress Notes (Signed)
In nurse clinic for immunizations. Patient requesting Covid vaccine. Voices no concerns. VIS reviewed and given to patient. Vaccine (Covid Spikevax 12+) given by Noreene Larsson, LPN; tolerated well and no issues noted. [facility] updated and copies given to patient. Abagail Kitchens, RN

## 2023-06-15 DIAGNOSIS — Z419 Encounter for procedure for purposes other than remedying health state, unspecified: Secondary | ICD-10-CM | POA: Diagnosis not present

## 2023-06-23 ENCOUNTER — Other Ambulatory Visit: Payer: Self-pay | Admitting: Gastroenterology

## 2023-06-28 ENCOUNTER — Encounter: Payer: Self-pay | Admitting: Nurse Practitioner

## 2023-06-28 NOTE — Progress Notes (Unsigned)
Office Visit    Patient Name: Kristina Reed Date of Encounter: 06/29/2023  Primary Care Provider:  Margarita Mail, DO Primary Cardiologist:  Yvonne Kendall, MD  Chief Complaint    57 y.o. female with history of hypertension, hypothyroidism, eosinophilic esophagitis, chest pain, dyspnea, and cervical cancer, who presents for follow-up of palpitations and dyspnea.   Past Medical History    Past Medical History:  Diagnosis Date   Anxiety    Arthritis    right knee and right elbow   Cancer (HCC)    Cervical CA with partial hysterectomy.   Chest pain    a. 02/2023 Cor CTA: Ca2+ = 0. Nl cors.   Cognitive impairment, mild, so stated    Depression    Diverticulosis    Dyspnea on exertion    a. 02/2023 Echo: EF 60-65%, no rwma, nl RV size/fxn.   Eosinophilic esophagitis 09/03/2017   Biopsy Dec 2018   Esophageal dysphagia    Family history of breast cancer 05/2021   cancer genetic testing letter sent   Gallstones    GERD (gastroesophageal reflux disease)    History of cervical cancer    Hx of cervical cancer 01/30/2016   Hypertension    Hypothyroidism    Palpitations    a. 02/2023 Zio: Predominantly RSR @ 82 (48-162). Rare PACs/PVCs. No sustained arrhythmias/pauses. Triggered events = RSR and sinus tach.   Sleep apnea    does not use a C-PAP   Status post partial hysterectomy    Due to Cervical CA   Vaginal inclusion cyst    Past Surgical History:  Procedure Laterality Date   BREAST BIOPSY Bilateral 01/01/2016   Fibroadenoma   COLONOSCOPY WITH PROPOFOL N/A 06/27/2015   Procedure: COLONOSCOPY WITH PROPOFOL;  Surgeon: Elnita Maxwell, MD;  Location: [facility] ENDOSCOPY;  Service: Endoscopy;  Laterality: N/A;   COLONOSCOPY WITH PROPOFOL N/A 09/23/2017   Procedure: COLONOSCOPY WITH PROPOFOL;  Surgeon: Wyline Mood, MD;  Location: [facility] ENDOSCOPY;  Service: Gastroenterology;  Laterality: N/A;   COLONOSCOPY WITH PROPOFOL N/A 06/09/2021   Procedure: COLONOSCOPY WITH  PROPOFOL;  Surgeon: Toney Reil, MD;  Location: [facility] ENDOSCOPY;  Service: Gastroenterology;  Laterality: N/A;   ESOPHAGOGASTRODUODENOSCOPY (EGD) WITH PROPOFOL N/A 07/22/2017   Procedure: ESOPHAGOGASTRODUODENOSCOPY (EGD) WITH PROPOFOL;  Surgeon: Wyline Mood, MD;  Location: [facility] ENDOSCOPY;  Service: Gastroenterology;  Laterality: N/A;   ESOPHAGOGASTRODUODENOSCOPY (EGD) WITH PROPOFOL N/A 03/31/2018   Procedure: ESOPHAGOGASTRODUODENOSCOPY (EGD) WITH PROPOFOL;  Surgeon: Toney Reil, MD;  Location: [facility] ENDOSCOPY;  Service: Gastroenterology;  Laterality: N/A;   ESOPHAGOGASTRODUODENOSCOPY (EGD) WITH PROPOFOL N/A 08/25/2021   Procedure: ESOPHAGOGASTRODUODENOSCOPY (EGD) WITH PROPOFOL;  Surgeon: Toney Reil, MD;  Location: [facility] ENDOSCOPY;  Service: Gastroenterology;  Laterality: N/A;   ESOPHAGOGASTRODUODENOSCOPY (EGD) WITH PROPOFOL N/A 11/20/2022   Procedure: ESOPHAGOGASTRODUODENOSCOPY (EGD) WITH PROPOFOL;  Surgeon: Toney Reil, MD;  Location: [facility] ENDOSCOPY;  Service: Gastroenterology;  Laterality: N/A;   KNEE ARTHROSCOPY Right 07/22/2016   Procedure: ARTHROSCOPY KNEE debridement microfracture;  Surgeon: Erin Sons, MD;  Location: [facility] ORS;  Service: Orthopedics;  Laterality: Right;   KNEE ARTHROSCOPY WITH MEDIAL MENISECTOMY Left 11/10/2017   Procedure: KNEE ARTHROSCOPY WITH PARTIAL MEDIAL MENISECTOMY&patella femoral debridement, & ablation;  Surgeon: Erin Sons, MD;  Location: [facility] ORS;  Service: Orthopedics;  Laterality: Left;   TOTAL KNEE ARTHROPLASTY Right 11/24/2021   Procedure: TOTAL KNEE ARTHROPLASTY;  Surgeon: Lyndle Herrlich, MD;  Location: [facility] ORS;  Service: Orthopedics;  Laterality: Right;   TUBAL LIGATION  VAGINAL HYSTERECTOMY     partial hysterectomy    Allergies  Allergies  Allergen Reactions   Prednisone Nausea And Vomiting   Methylprednisolone Palpitations    Increased heart rate   Percocet [Oxycodone-Acetaminophen] Palpitations     History of Present Illness      57 y.o. y/o female with history of hypertension, hypothyroidism, eosinophilic esophagitis, chest pain, dyspnea, palpitations, and cervical cancer.  Kristina Reed was previously evaluated in 02/2023 w/ complaints of c/p, dyspnea, and palpitations.  Event monitoring showed rare PACs/PVCs, but no significant arrhythmias.  Echo showed nl LV fxn w/o significant valvular dzs.  Cor CTA showed nl cors w/ a Ca2+ of 0.   Kristina Reed was last seen in cardiology clinic in September 2024, at which time she reported ongoing dyspnea.  Blood pressure was elevated at time and metoprolol therapy was added in the setting of palpitations.  Since then, she has not been experiencing palpitations or chest pain however, she notes ongoing dyspnea on exertion.  Activity at home is fairly limited to what sure she might do around the house.  She denies PND, orthopnea, dizziness, syncope, edema, or early satiety. Objective   Home Medications    Current Outpatient Medications  Medication Sig Dispense Refill   amitriptyline (ELAVIL) 25 MG tablet TAKE 1 TABLET BY MOUTH AT BEDTIME 30 tablet 0   estradiol (ESTRACE) 0.5 MG tablet Take 1 tablet (0.5 mg total) by mouth daily. 90 tablet 3   fluconazole (DIFLUCAN) 100 MG tablet Take 2 tablets (200 mg total) by mouth daily. 28 tablet 0   FLUoxetine (PROZAC) 20 MG capsule Take 1 capsule (20 mg total) by mouth daily. 90 capsule 1   levothyroxine (SYNTHROID) 75 MCG tablet Take 1 tablet (75 mcg total) by mouth daily before breakfast. 90 tablet 1   lisinopril (ZESTRIL) 5 MG tablet Take 1 tablet by mouth once daily 90 tablet 1   meloxicam (MOBIC) 15 MG tablet Take 15 mg by mouth daily.     metoprolol succinate (TOPROL-XL) 25 MG 24 hr tablet Take 1 tablet (25 mg total) by mouth daily. Take with or immediately following a meal. 90 tablet 3   omeprazole (PRILOSEC) 40 MG capsule Take 1 capsule (40 mg total) by mouth 2 (two) times daily before a meal. 60 capsule 2    tiZANidine (ZANAFLEX) 4 MG tablet Take 4 mg by mouth at bedtime.     No current facility-administered medications for this visit.     Review of Systems    Ongoing dyspnea on exertion in the setting of limited activity.  She denies chest pain, palpitations, PND, orthopnea, dizziness, syncope, edema, or early satiety.  All other systems reviewed and are otherwise negative except as noted above.    Physical Exam    VS:  BP 123/62   Pulse 78   Ht 5\' 1"  (1.549 m)   Wt 175 lb (79.4 kg)   LMP 05/09/1993 (Approximate)   SpO2 96%   BMI 33.07 kg/m  , BMI Body mass index is 33.07 kg/m.     GEN: Well nourished, well developed, in no acute distress. HEENT: normal. Neck: Supple, no JVD, carotid bruits, or masses. Cardiac: RRR, no murmurs, rubs, or gallops. No clubbing, cyanosis, edema.  Radials 2+/PT 2+ and equal bilaterally.  Respiratory:  Respirations regular and unlabored, clear to auscultation bilaterally. GI: Soft, nontender, nondistended, BS + x 4. MS: no deformity or atrophy. Skin: warm and dry, no rash. Neuro:  Strength and sensation are intact. Psych:  Normal affect.  Accessory Clinical Findings    ECG personally reviewed by me today - EKG Interpretation Date/Time:  Tuesday June 29 2023 09:18:17 EDT Ventricular Rate:  78 PR Interval:  158 QRS Duration:  86 QT Interval:  394 QTC Calculation: 449 R Axis:   -7  Text Interpretation: Normal sinus rhythm DELAYED R WAVE PROGRESSION Confirmed by Nicolasa Ducking [phone]) on 06/29/2023 9:24:11 AM  - no acute changes.  Lab Results  Component Value Date   WBC 7.8 05/31/2023   HGB 12.8 05/31/2023   HCT 39.2 05/31/2023   MCV 86.0 05/31/2023   PLT 248 05/31/2023   Lab Results  Component Value Date   CREATININE 0.84 05/31/2023   BUN 14 05/31/2023   NA 138 05/31/2023   K 4.0 05/31/2023   CL 101 05/31/2023   CO2 28 05/31/2023   Lab Results  Component Value Date   ALT 25 10/05/2022   AST 19 10/05/2022   ALKPHOS 87  04/08/2022   BILITOT 0.3 10/05/2022   Lab Results  Component Value Date   CHOL 213 (H) 05/20/2022   HDL 78 05/20/2022   LDLCALC 112 (H) 05/20/2022   TRIG 119 05/20/2022   CHOLHDL 2.7 05/20/2022    Lab Results  Component Value Date   HGBA1C 5.8 (H) 03/03/2022   Lab Results  Component Value Date   TSH 4.48 10/05/2022       Assessment & Plan    1.  Dyspnea on exertion: Patient with a history of dyspnea on exertion for prior coronary CT angiogram showing normal coronary arteries and calcium score of 0, echo showing normal LV and RV function without significant valvular disease, and event monitoring showed only rare PACs and PVCs.  Of note, chest CT June 2024 also showed no significant extracardiac findings.  With the addition of beta-blocker, blood pressure and palpitations have improved.  She has ongoing dyspnea in the setting of very limited activity at home-suspect deconditioning.  Strongly encouraged 30 minutes of moderate activity daily and discussed that this can be cumulative instead of all at once.  If symptoms persist, recommend pulmonology evaluation.  2.  Palpitations: Rare PACs and PVCs seen on previous monitoring.  Beta-blocker therapy added in September with resolution of palpitations at this time and improvement in blood pressure.  3.  Primary hypertension: Blood pressure improved following addition of beta-blocker therapy at last visit.  123/62 today.  Continue beta-blocker and ACE inhibitor therapy.  Normal renal function electrolytes in September.  4.  Hypothyroidism: On levothyroxine with normal TSH earlier this year.  5.  Disposition: Follow-up in 6 to 12 months or sooner if necessary.  Nicolasa Ducking, NP 06/29/2023, 9:35 AM

## 2023-06-29 ENCOUNTER — Ambulatory Visit: Payer: Medicaid Other | Attending: Nurse Practitioner | Admitting: Nurse Practitioner

## 2023-06-29 ENCOUNTER — Encounter: Payer: Self-pay | Admitting: Nurse Practitioner

## 2023-06-29 VITALS — BP 123/62 | HR 78 | Ht 61.0 in | Wt 175.0 lb

## 2023-06-29 DIAGNOSIS — R0602 Shortness of breath: Secondary | ICD-10-CM | POA: Diagnosis not present

## 2023-06-29 DIAGNOSIS — R002 Palpitations: Secondary | ICD-10-CM | POA: Diagnosis not present

## 2023-06-29 DIAGNOSIS — I10 Essential (primary) hypertension: Secondary | ICD-10-CM | POA: Diagnosis not present

## 2023-06-29 DIAGNOSIS — Z7689 Persons encountering health services in other specified circumstances: Secondary | ICD-10-CM | POA: Diagnosis not present

## 2023-06-29 NOTE — Patient Instructions (Signed)
Medication Instructions:  Your physician recommends that you continue on your current medications as directed. Please refer to the Current Medication list given to you today.  *If you need a refill on your cardiac medications before your next appointment, please call your pharmacy*   Lab Work: NONE If you have labs (blood work) drawn today and your tests are completely normal, you will receive your results only by: MyChart Message (if you have MyChart) OR A paper copy in the mail If you have any lab test that is abnormal or we need to change your treatment, we will call you to review the results.   Testing/Procedures: EKG   Follow-Up: At [facility], you and your health needs are our priority.  As part of our continuing mission to provide you with exceptional heart care, we have created designated Provider Care Teams.  These Care Teams include your primary Cardiologist (physician) and Advanced Practice Providers (APPs -  Physician Assistants and Nurse Practitioners) who all work together to provide you with the care you need, when you need it.  We recommend signing up for the patient portal called "MyChart".  Sign up information is provided on this After Visit Summary.  MyChart is used to connect with patients for Virtual Visits (Telemedicine).  Patients are able to view lab/test results, encounter notes, upcoming appointments, etc.  Non-urgent messages can be sent to your provider as well.   To learn more about what you can do with MyChart, go to [email or web address].    Your next appointment:   1 year(s)  Provider:   You may see Yvonne Kendall, MD or one of the following Advanced Practice Providers on your designated Care Team:   Nicolasa Ducking, NP

## 2023-07-05 ENCOUNTER — Ambulatory Visit: Payer: Self-pay | Admitting: *Deleted

## 2023-07-05 ENCOUNTER — Ambulatory Visit (INDEPENDENT_AMBULATORY_CARE_PROVIDER_SITE_OTHER): Payer: Medicaid Other | Admitting: Family Medicine

## 2023-07-05 ENCOUNTER — Encounter: Payer: Self-pay | Admitting: Family Medicine

## 2023-07-05 VITALS — BP 108/80 | HR 90 | Temp 98.2°F | Resp 16 | Ht 61.5 in | Wt 175.7 lb

## 2023-07-05 DIAGNOSIS — M542 Cervicalgia: Secondary | ICD-10-CM

## 2023-07-05 DIAGNOSIS — H6123 Impacted cerumen, bilateral: Secondary | ICD-10-CM | POA: Diagnosis not present

## 2023-07-05 DIAGNOSIS — H6122 Impacted cerumen, left ear: Secondary | ICD-10-CM | POA: Diagnosis not present

## 2023-07-05 DIAGNOSIS — H9202 Otalgia, left ear: Secondary | ICD-10-CM | POA: Diagnosis not present

## 2023-07-05 MED ORDER — AMOXICILLIN-POT CLAVULANATE 875-125 MG PO TABS
1.0000 | ORAL_TABLET | Freq: Two times a day (BID) | ORAL | 0 refills | Status: AC
Start: 2023-07-05 — End: 2023-07-12

## 2023-07-05 MED ORDER — PREDNISONE 20 MG PO TABS: ORAL_TABLET | ORAL | 0 refills | Status: DC

## 2023-07-05 MED ORDER — DEXAMETHASONE SODIUM PHOSPHATE 10 MG/ML IJ SOLN
10.0000 mg | Freq: Once | INTRAMUSCULAR | Status: AC
Start: 2023-07-05 — End: 2023-07-05
  Administered 2023-07-05: 10 mg via INTRAMUSCULAR

## 2023-07-05 NOTE — Telephone Encounter (Signed)
  Chief Complaint: Severe headache on left side of head that she woke up with this morning. Symptoms: No other symptoms Frequency: Since she woke up this morning. Pertinent Negatives: Patient denies dizziness, light or sound sensitivity, accidents, injuries, nausea or blurred vision. Disposition: [x] ED /[] Urgent Care (no appt availability in office) / [] Appointment(In office/virtual)/ []  [facility] [facility]/ [] Home Care/ [] Refused Recommended Disposition /[] [facility]/ []  Follow-up with PCP Additional Notes: When I referred her to the ED she put her boyfriend, Chanetta Marshall on the phone.   "Talk to him".    I let him know she needed to be seen in the ED for the severe left sided headaches.   He was agreeable to this plan and is arranging transportation.  I let him know they could call 911 if needed.    He thanked me for my help and asked if she could go to the [facility] walk in clinic.  I let him know that would be ok but if they needed to do further testing or scans she would probably be referred on to the ED.   At least of [place] they are at the hospital if that should become necessary.

## 2023-07-05 NOTE — Telephone Encounter (Signed)
Reason for Disposition  [1] SEVERE headache AND [2] sudden-onset (i.e., reaching maximum intensity within seconds to 1 hour)  Answer Assessment - Initial Assessment Questions 1. LOCATION: "Where does it hurt?"      I'm having a lot of pain on the left side of my head. 2. ONSET: "When did the headache start?" (Minutes, hours or days)      This morning. 3. PATTERN: "Does the pain come and go, or has it been constant since it started?"     Constant 4. SEVERITY: "How bad is the pain?" and "What does it keep you from doing?"  (e.g., Scale 1-10; mild, moderate, or severe)   - MILD (1-3): doesn't interfere with normal activities    - MODERATE (4-7): interferes with normal activities or awakens from sleep    - SEVERE (8-10): excruciating pain, unable to do any normal activities        Severe on left side of head 5. RECURRENT SYMPTOM: "Have you ever had headaches before?" If Yes, ask: "When was the last time?" and "What happened that time?"      Yes   I get migraines.    No nausea or blurred vision.   No sensitivity to light or sound.  Not dizzy 6. CAUSE: "What do you think is causing the headache?"     This is different from my migraines.    It started after I woke up this morning. 7. MIGRAINE: "Have you been diagnosed with migraine headaches?" If Yes, ask: "Is this headache similar?"      Yes 8. HEAD INJURY: "Has there been any recent injury to the head?"      No 9. OTHER SYMPTOMS: "Do you have any other symptoms?" (fever, stiff neck, eye pain, sore throat, cold symptoms)     Denies any other symptoms.   10. PREGNANCY: "Is there any chance you are pregnant?" "When was your last menstrual period?"       N/A due to age  Protocols used: [facility]

## 2023-07-05 NOTE — Progress Notes (Unsigned)
Patient ID: Kristina Reed, female    DOB: December 17, 1965, 57 y.o.   MRN: [id number]  PCP: Margarita Mail, DO  Chief Complaint  Patient presents with   Pain    Behind L ear, started this morning    Subjective:   Kristina Reed is a 57 y.o. 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   Erosive gastritis 11/20/2022   S/P TKR (total knee replacement) 11/25/2021   S/P TKR (total knee replacement) using cement, right 11/24/2021   Erosive esophagitis    Gastric erosion determined by endoscopy    History of colonic polyps    Chronic pain syndrome 11/26/2020   Bilateral primary osteoarthritis of knee 11/26/2020   Grade I internal hemorrhoids 11/16/2019   Genetic testing 04/20/2019   Family history of breast cancer    History of cervical cancer    Chest pain of uncertain etiology 03/17/2019   Family hx-breast malignancy 12/16/2018   Dysphagia    Essential hypertension 01/19/2018   Obesity (BMI 30.0-34.9) 01/19/2018   Eosinophilic esophagitis 09/03/2017   Apophysitis 08/26/2017   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   Pain in joint, multiple sites 07/05/2017   Osteoarthritis of knee 07/05/2017   Lichen sclerosus et atrophicus 04/27/2017   Headache 04/27/2017   Headache disorder 02/17/2017   Post-concussion headache 02/17/2017   Medication monitoring encounter 09/21/2016   Hematuria 04/23/2016   Hx of cervical cancer 01/30/2016   Menopause 01/30/2016   Status post bilateral breast biopsy 01/07/2016   Chronic pain of both knees 07/23/2014   Depression, major, recurrent, moderate (HCC) 02/02/2014   Adult hypothyroidism 02/02/2014    Obstructive apnea 02/02/2014   Anxiety and depression 02/02/2014   Incomplete bladder emptying 12/12/2012   Tenosynovitis of foot 05/30/2012   Benign neoplasm of kidney 05/13/2012   Urge incontinence 05/13/2012   FOM (frequency of micturition) 05/13/2012   Chronic pain of right hand 05/05/2012   Tarsal tunnel syndrome 03/03/2012      Current Outpatient Medications:    amitriptyline (ELAVIL) 25 MG tablet, TAKE 1 TABLET BY MOUTH AT BEDTIME, Disp: 30 tablet, Rfl: 0   estradiol (ESTRACE) 0.5 MG tablet, Take 1 tablet (0.5 mg total) by mouth daily., Disp: 90 tablet, Rfl: 3   fluconazole (DIFLUCAN) 100 MG tablet, Take 2 tablets (200 mg total) by mouth daily., Disp: 28 tablet, Rfl: 0   FLUoxetine (PROZAC) 20 MG capsule, Take 1 capsule (20 mg total) by mouth daily., Disp: 90 capsule, Rfl: 1   levothyroxine (SYNTHROID) 75 MCG tablet, Take 1 tablet (75 mcg total) by mouth daily before breakfast., Disp: 90 tablet, Rfl: 1   lisinopril (ZESTRIL) 5 MG tablet, Take 1 tablet by mouth once daily, Disp: 90 tablet, Rfl: 1   meloxicam (MOBIC) 15 MG tablet, Take 15 mg by mouth daily., Disp: , Rfl:    metoprolol succinate (TOPROL-XL) 25 MG 24 hr tablet, Take 1 tablet (25 mg total) by mouth daily. Take with or immediately following a meal., Disp: 90 tablet, Rfl: 3   omeprazole (PRILOSEC) 40 MG capsule, Take 1 capsule (40 mg total) by mouth 2 (two) times daily before a meal., Disp: 60 capsule, Rfl:  2   tiZANidine (ZANAFLEX) 4 MG tablet, Take 4 mg by mouth at bedtime., Disp: , Rfl:    Allergies  Allergen Reactions   Prednisone Nausea And Vomiting   Methylprednisolone Palpitations    Increased heart rate   Percocet [Oxycodone-Acetaminophen] Palpitations     Social History   Tobacco Use   Smoking status: Never    Passive exposure: Never   Smokeless tobacco: Never  Vaping Use   Vaping status: Never Used  Substance Use Topics   Alcohol use: Not Currently   Drug use: Never      Chart Review  Today: ***  Review of Systems     Objective:   Vitals:   07/05/23 1346 07/05/23 1351  BP: 108/80   Pulse: (!) 107 90  Resp: 16   Temp: 98.2 F (36.8 C)   TempSrc: Oral   SpO2: 96%   Weight: 175 lb 11.2 oz (79.7 kg)   Height: 5' 1.5" (1.562 m)     Body mass index is 32.66 kg/m.  Physical Exam Vitals and nursing note reviewed.  Constitutional:      General: She is not in acute distress.    Appearance: Normal appearance. She is not ill-appearing, toxic-appearing or diaphoretic.  HENT:     Head: Normocephalic and atraumatic.     Right Ear: External ear normal. There is impacted cerumen. No mastoid tenderness.     Left Ear: There is impacted cerumen. There is mastoid tenderness.     Ears:     Comments: Left ear/pinna looks mildly edematous and erythematous - no pain with manipulation, palpation, no tragus ttp Left mastoid ttp without erythema or edema or nearby lymphadenopathy     Nose: Nose normal.     Mouth/Throat:     Mouth: Mucous membranes are moist.     Pharynx: Oropharynx is clear. No oropharyngeal exudate or posterior oropharyngeal erythema.  Eyes:     General: No scleral icterus.       Right eye: No discharge.        Left eye: No discharge.     Conjunctiva/sclera: Conjunctivae normal.  Musculoskeletal:     Cervical back: No edema or erythema. No spinous process tenderness or muscular tenderness. Decreased range of motion.  Lymphadenopathy:     Head:     Right side of head: No submental, submandibular, tonsillar, preauricular, posterior auricular or occipital adenopathy.     Left side of head: No submental, submandibular, tonsillar, preauricular, posterior auricular or occipital adenopathy.     Cervical: No cervical adenopathy.     Right cervical: No superficial, deep or posterior cervical adenopathy.    Left cervical: No superficial, deep or posterior cervical adenopathy.      Results for orders placed or performed in visit on 06/04/23  Urine Culture    Specimen: Urine  Result Value Ref Range   MICRO NUMBER: [id number]    SPECIMEN QUALITY: Adequate    Sample Source URINE    STATUS: FINAL    ISOLATE 1: Candida krusei (A)        Assessment & Plan:   ***     Danelle Berry, PA-C 07/05/23 2:00 PM

## 2023-07-12 ENCOUNTER — Encounter: Payer: Self-pay | Admitting: Internal Medicine

## 2023-07-12 ENCOUNTER — Ambulatory Visit (INDEPENDENT_AMBULATORY_CARE_PROVIDER_SITE_OTHER): Payer: Medicaid Other | Admitting: Internal Medicine

## 2023-07-12 VITALS — BP 120/82 | HR 114 | Temp 98.8°F | Resp 18 | Ht 61.5 in | Wt 175.3 lb

## 2023-07-12 DIAGNOSIS — H9202 Otalgia, left ear: Secondary | ICD-10-CM | POA: Diagnosis not present

## 2023-07-12 DIAGNOSIS — D72829 Elevated white blood cell count, unspecified: Secondary | ICD-10-CM | POA: Diagnosis not present

## 2023-07-12 DIAGNOSIS — H60502 Unspecified acute noninfective otitis externa, left ear: Secondary | ICD-10-CM

## 2023-07-12 DIAGNOSIS — Z7689 Persons encountering health services in other specified circumstances: Secondary | ICD-10-CM | POA: Diagnosis not present

## 2023-07-12 LAB — CBC WITH DIFFERENTIAL/PLATELET
Absolute Lymphocytes: 2750 {cells}/uL (ref 850–3900)
Absolute Monocytes: 662 {cells}/uL (ref 200–950)
Basophils Absolute: 43 {cells}/uL (ref 0–200)
Basophils Relative: 0.3 %
Eosinophils Absolute: 562 {cells}/uL — ABNORMAL HIGH (ref 15–500)
Eosinophils Relative: 3.9 %
HCT: 37.7 % (ref 35.0–45.0)
Hemoglobin: 12.4 g/dL (ref 11.7–15.5)
MCH: 27.8 pg (ref 27.0–33.0)
MCHC: 32.9 g/dL (ref 32.0–36.0)
MCV: 84.5 fL (ref 80.0–100.0)
MPV: 10.6 fL (ref 7.5–12.5)
Monocytes Relative: 4.6 %
Neutro Abs: 10382 {cells}/uL — ABNORMAL HIGH (ref 1500–7800)
Neutrophils Relative %: 72.1 %
Platelets: 230 10*3/uL (ref 140–400)
RBC: 4.46 10*6/uL (ref 3.80–5.10)
RDW: 13 % (ref 11.0–15.0)
Total Lymphocyte: 19.1 %
WBC: 14.4 10*3/uL — ABNORMAL HIGH (ref 3.8–10.8)

## 2023-07-12 MED ORDER — CIPROFLOXACIN-DEXAMETHASONE 0.3-0.1 % OT SUSP
4.0000 [drp] | Freq: Two times a day (BID) | OTIC | 0 refills | Status: DC
Start: 2023-07-12 — End: 2023-11-24

## 2023-07-12 NOTE — Progress Notes (Signed)
Acute Office Visit  Subjective:     Patient ID: Kristina Reed, female    DOB: 04-Nov-1965, 57 y.o.   MRN: [id number]  Chief Complaint  Patient presents with   Follow-up   Ear Pain    HPI Patient is in today for follow up on left sided mastoid pain. She was seen here last week for the same issue and was treated with steroid injection and Augmentin. She finished this without issue but states she still has the same symptoms which is tenderness over the mastoid bone now for 1 week. Pain is constant and feels like she is "getting hit with a hammer". She denies any other upper respiratory symptoms, no ear pain, discharge, hearing changes, fevers. Tried some Tylenol, Mobic which does not help.   Review of Systems  Constitutional:  Negative for chills and fever.  HENT:  Negative for congestion, ear pain, hearing loss, sinus pain, sore throat and tinnitus.   Respiratory:  Negative for cough.   Neurological:  Negative for dizziness and headaches.        Objective:    BP 120/82   Pulse (!) 114   Temp 98.8 F (37.1 C)   Resp 18   Ht 5' 1.5" (1.562 m)   Wt 175 lb 4.8 oz (79.5 kg)   LMP 05/09/1993 (Approximate)   SpO2 97%   BMI 32.59 kg/m  BP Readings from Last 3 Encounters:  07/05/23 108/80  06/29/23 123/62  06/08/23 114/62   Wt Readings from Last 3 Encounters:  07/12/23 175 lb 4.8 oz (79.5 kg)  07/05/23 175 lb 11.2 oz (79.7 kg)  06/29/23 175 lb (79.4 kg)      Physical Exam Constitutional:      Appearance: Normal appearance.  HENT:     Head: Normocephalic and atraumatic.     Comments: Mastoid ttp on the left    Right Ear: Ear canal and external ear normal. There is impacted cerumen.     Left Ear: Tympanic membrane and external ear normal.     Ears:     Comments: Otitis externa present on the left    Mouth/Throat:     Mouth: Mucous membranes are moist.     Pharynx: Oropharynx is clear.  Eyes:     Extraocular Movements: Extraocular movements intact.      Conjunctiva/sclera: Conjunctivae normal.     Pupils: Pupils are equal, round, and reactive to light.  Neck:     Comments: No thyromegaly  Cardiovascular:     Rate and Rhythm: Normal rate and regular rhythm.  Pulmonary:     Effort: Pulmonary effort is normal.     Breath sounds: Normal breath sounds.  Musculoskeletal:     Cervical back: No tenderness.  Lymphadenopathy:     Cervical: No cervical adenopathy.  Skin:    General: Skin is warm and dry.  Neurological:     General: No focal deficit present.     Mental Status: She is alert. Mental status is at baseline.  Psychiatric:        Mood and Affect: Mood normal.        Behavior: Behavior normal.     No results found for any visits on 07/12/23.      Assessment & Plan:   1. Acute otitis externa of left ear, unspecified type/Mastoid pain, left: Previously treated with Augmentin and Dexamethasone IM, physical exam completely normal other than mild otitis externa on the left and pain to palpation over mastoid. Mastoiditis unlikely, no  other symptoms, fevers, etc. Will obtain labs to rule out infection. Will treat otitis externa with Ciprodex drops.   - CBC w/Diff/Platelet - ciprofloxacin-dexamethasone (CIPRODEX) OTIC suspension; Place 4 drops into the left ear 2 (two) times daily.  Dispense: 7.5 mL; Refill: 0   Return if symptoms worsen or fail to improve.  Margarita Mail, DO

## 2023-07-14 NOTE — Addendum Note (Signed)
Addended by: Margarita Mail on: 07/14/2023 08:09 AM   Modules accepted: Orders

## 2023-07-16 DIAGNOSIS — Z419 Encounter for procedure for purposes other than remedying health state, unspecified: Secondary | ICD-10-CM | POA: Diagnosis not present

## 2023-07-20 DIAGNOSIS — R413 Other amnesia: Secondary | ICD-10-CM | POA: Diagnosis not present

## 2023-07-20 DIAGNOSIS — R29818 Other symptoms and signs involving the nervous system: Secondary | ICD-10-CM | POA: Diagnosis not present

## 2023-07-20 DIAGNOSIS — Z818 Family history of other mental and behavioral disorders: Secondary | ICD-10-CM | POA: Diagnosis not present

## 2023-07-20 DIAGNOSIS — Z7689 Persons encountering health services in other specified circumstances: Secondary | ICD-10-CM | POA: Diagnosis not present

## 2023-07-22 ENCOUNTER — Other Ambulatory Visit: Payer: Self-pay | Admitting: Neurology

## 2023-07-22 ENCOUNTER — Ambulatory Visit
Admission: RE | Admit: 2023-07-22 | Discharge: 2023-07-22 | Disposition: A | Discharge status: Home / Self Care | Payer: Medicaid Other | Source: Ambulatory Visit | Attending: Internal Medicine | Admitting: Internal Medicine

## 2023-07-22 DIAGNOSIS — Z7689 Persons encountering health services in other specified circumstances: Secondary | ICD-10-CM | POA: Diagnosis not present

## 2023-07-22 DIAGNOSIS — H9202 Otalgia, left ear: Secondary | ICD-10-CM | POA: Insufficient documentation

## 2023-07-22 DIAGNOSIS — H9192 Unspecified hearing loss, left ear: Secondary | ICD-10-CM | POA: Diagnosis not present

## 2023-07-22 DIAGNOSIS — H7090 Unspecified mastoiditis, unspecified ear: Secondary | ICD-10-CM | POA: Diagnosis not present

## 2023-07-22 DIAGNOSIS — R220 Localized swelling, mass and lump, head: Secondary | ICD-10-CM | POA: Diagnosis not present

## 2023-07-22 DIAGNOSIS — R413 Other amnesia: Secondary | ICD-10-CM

## 2023-07-23 ENCOUNTER — Encounter: Payer: Self-pay | Admitting: Neurology

## 2023-08-01 ENCOUNTER — Other Ambulatory Visit: Payer: Medicaid Other

## 2023-08-02 ENCOUNTER — Ambulatory Visit (INDEPENDENT_AMBULATORY_CARE_PROVIDER_SITE_OTHER): Payer: Medicaid Other | Admitting: Obstetrics and Gynecology

## 2023-08-02 ENCOUNTER — Encounter: Payer: Self-pay | Admitting: Obstetrics and Gynecology

## 2023-08-02 VITALS — BP 120/78 | HR 90

## 2023-08-02 DIAGNOSIS — Z01818 Encounter for other preprocedural examination: Secondary | ICD-10-CM

## 2023-08-02 MED ORDER — IBUPROFEN 600 MG PO TABS: 600.0000 mg | ORAL_TABLET | Freq: Four times a day (QID) | ORAL | 0 refills | Status: DC | PRN

## 2023-08-02 MED ORDER — POLYETHYLENE GLYCOL 3350 17 GM/SCOOP PO POWD: 17.0000 g | Freq: Every day | ORAL | 0 refills | Status: DC

## 2023-08-02 MED ORDER — TRAMADOL HCL 50 MG PO TABS: 50.0000 mg | ORAL_TABLET | Freq: Three times a day (TID) | ORAL | 0 refills | Status: AC | PRN

## 2023-08-02 MED ORDER — ACETAMINOPHEN 500 MG PO TABS: 500.0000 mg | ORAL_TABLET | Freq: Four times a day (QID) | ORAL | 0 refills | Status: DC | PRN

## 2023-08-02 NOTE — Progress Notes (Signed)
[facility] Pre-Operative Exam  Subjective Chief Complaint: Kristina Reed presents for a preoperative encounter.   History of Present Illness: Kristina Reed is a 57 y.o. female who presents for preoperative visit.  She is scheduled to undergo Exam under anesthesia, urethral diverticulectomy, cystoscopy, rectus fascial sling  on 08/30/23.  Her symptoms include Urethral pain and stress urinary incontinence, and she was was found to have Stage 0 anterior, Stage II posterior, Stage I apical prolapse.   Urodynamics showed: Deferred  Past Medical History:  Diagnosis Date   Anxiety    Arthritis    right knee and right elbow   Cancer (HCC)    Cervical CA with partial hysterectomy.   Chest pain    a. 02/2023 Cor CTA: Ca2+ = 0. Nl cors.   Cognitive impairment, mild, so stated    Depression    Diverticulosis    Dyspnea on exertion    a. 02/2023 Echo: EF 60-65%, no rwma, nl RV size/fxn.   Eosinophilic esophagitis 09/03/2017   Biopsy Dec 2018   Esophageal dysphagia    Family history of breast cancer 05/2021   cancer genetic testing letter sent   Gallstones    GERD (gastroesophageal reflux disease)    History of cervical cancer    Hx of cervical cancer 01/30/2016   Hypertension    Hypothyroidism    Palpitations    a. 02/2023 Zio: Predominantly RSR @ 82 (48-162). Rare PACs/PVCs. No sustained arrhythmias/pauses. Triggered events = RSR and sinus tach.   Sleep apnea    does not use a C-PAP   Status post partial hysterectomy    Due to Cervical CA   Vaginal inclusion cyst      Past Surgical History:  Procedure Laterality Date   BREAST BIOPSY Bilateral 01/01/2016   Fibroadenoma   COLONOSCOPY WITH PROPOFOL N/A 06/27/2015   Procedure: COLONOSCOPY WITH PROPOFOL;  Surgeon: Elnita Maxwell, MD;  Location: [facility] ENDOSCOPY;  Service: Endoscopy;  Laterality: N/A;   COLONOSCOPY WITH PROPOFOL N/A 09/23/2017   Procedure: COLONOSCOPY WITH PROPOFOL;  Surgeon: Wyline Mood, MD;   Location: [facility] ENDOSCOPY;  Service: Gastroenterology;  Laterality: N/A;   COLONOSCOPY WITH PROPOFOL N/A 06/09/2021   Procedure: COLONOSCOPY WITH PROPOFOL;  Surgeon: Toney Reil, MD;  Location: [facility] ENDOSCOPY;  Service: Gastroenterology;  Laterality: N/A;   ESOPHAGOGASTRODUODENOSCOPY (EGD) WITH PROPOFOL N/A 07/22/2017   Procedure: ESOPHAGOGASTRODUODENOSCOPY (EGD) WITH PROPOFOL;  Surgeon: Wyline Mood, MD;  Location: [facility] ENDOSCOPY;  Service: Gastroenterology;  Laterality: N/A;   ESOPHAGOGASTRODUODENOSCOPY (EGD) WITH PROPOFOL N/A 03/31/2018   Procedure: ESOPHAGOGASTRODUODENOSCOPY (EGD) WITH PROPOFOL;  Surgeon: Toney Reil, MD;  Location: [facility] ENDOSCOPY;  Service: Gastroenterology;  Laterality: N/A;   ESOPHAGOGASTRODUODENOSCOPY (EGD) WITH PROPOFOL N/A 08/25/2021   Procedure: ESOPHAGOGASTRODUODENOSCOPY (EGD) WITH PROPOFOL;  Surgeon: Toney Reil, MD;  Location: [facility] ENDOSCOPY;  Service: Gastroenterology;  Laterality: N/A;   ESOPHAGOGASTRODUODENOSCOPY (EGD) WITH PROPOFOL N/A 11/20/2022   Procedure: ESOPHAGOGASTRODUODENOSCOPY (EGD) WITH PROPOFOL;  Surgeon: Toney Reil, MD;  Location: [facility] ENDOSCOPY;  Service: Gastroenterology;  Laterality: N/A;   KNEE ARTHROSCOPY Right 07/22/2016   Procedure: ARTHROSCOPY KNEE debridement microfracture;  Surgeon: Erin Sons, MD;  Location: [facility] ORS;  Service: Orthopedics;  Laterality: Right;   KNEE ARTHROSCOPY WITH MEDIAL MENISECTOMY Left 11/10/2017   Procedure: KNEE ARTHROSCOPY WITH PARTIAL MEDIAL MENISECTOMY&patella femoral debridement, & ablation;  Surgeon: Erin Sons, MD;  Location: [facility] ORS;  Service: Orthopedics;  Laterality: Left;   TOTAL KNEE ARTHROPLASTY Right 11/24/2021   Procedure: TOTAL KNEE ARTHROPLASTY;  Surgeon:  Lyndle Herrlich, MD;  Location: [facility] ORS;  Service: Orthopedics;  Laterality: Right;   TUBAL LIGATION     VAGINAL HYSTERECTOMY     partial hysterectomy    is allergic to prednisone, methylprednisolone,  and percocet [oxycodone-acetaminophen].   Family History  Problem Relation Age of Onset   Diabetes Mother    Arrhythmia Mother        Pacemaker   Lung cancer Father 78   Breast cancer Maternal Grandmother 12   Cancer Paternal Grandmother        cancer?   Breast cancer Maternal Aunt        80s   Cirrhosis Cousin    Breast cancer Cousin    Breast cancer Other        x4    Social History   Tobacco Use   Smoking status: Never    Passive exposure: Never   Smokeless tobacco: Never  Vaping Use   Vaping status: Never Used  Substance Use Topics   Alcohol use: Not Currently   Drug use: Never     Review of Systems was negative for a full 10 system review except as noted in the History of Present Illness.   Current Outpatient Medications:    acetaminophen (TYLENOL) 500 MG tablet, Take 1 tablet (500 mg total) by mouth every 6 (six) hours as needed (pain)., Disp: 30 tablet, Rfl: 0   amitriptyline (ELAVIL) 25 MG tablet, TAKE 1 TABLET BY MOUTH AT BEDTIME, Disp: 30 tablet, Rfl: 0   ciprofloxacin-dexamethasone (CIPRODEX) OTIC suspension, Place 4 drops into the left ear 2 (two) times daily., Disp: 7.5 mL, Rfl: 0   estradiol (ESTRACE) 0.5 MG tablet, Take 1 tablet (0.5 mg total) by mouth daily., Disp: 90 tablet, Rfl: 3   fluconazole (DIFLUCAN) 100 MG tablet, Take 2 tablets (200 mg total) by mouth daily., Disp: 28 tablet, Rfl: 0   FLUoxetine (PROZAC) 20 MG capsule, Take 1 capsule (20 mg total) by mouth daily., Disp: 90 capsule, Rfl: 1   ibuprofen (ADVIL) 600 MG tablet, Take 1 tablet (600 mg total) by mouth every 6 (six) hours as needed., Disp: 30 tablet, Rfl: 0   levothyroxine (SYNTHROID) 75 MCG tablet, Take 1 tablet (75 mcg total) by mouth daily before breakfast., Disp: 90 tablet, Rfl: 1   lisinopril (ZESTRIL) 5 MG tablet, Take 1 tablet by mouth once daily, Disp: 90 tablet, Rfl: 1   meloxicam (MOBIC) 15 MG tablet, Take 15 mg by mouth daily., Disp: , Rfl:    metoprolol succinate (TOPROL-XL)  25 MG 24 hr tablet, Take 1 tablet (25 mg total) by mouth daily. Take with or immediately following a meal., Disp: 90 tablet, Rfl: 3   omeprazole (PRILOSEC) 40 MG capsule, Take 1 capsule (40 mg total) by mouth 2 (two) times daily before a meal., Disp: 60 capsule, Rfl: 2   polyethylene glycol powder (GLYCOLAX/MIRALAX) 17 GM/SCOOP powder, Take 17 g by mouth daily. Drink 17g (1 scoop) dissolved in water per day., Disp: 255 g, Rfl: 0   tiZANidine (ZANAFLEX) 4 MG tablet, Take 4 mg by mouth at bedtime., Disp: , Rfl:    traMADol (ULTRAM) 50 MG tablet, Take 1 tablet (50 mg total) by mouth every 8 (eight) hours as needed for up to 5 days., Disp: 15 tablet, Rfl: 0   Objective Vitals:   08/02/23 1053  BP: 120/78  Pulse: 90    Gen: NAD CV: S1 S2 RRR Lungs: Clear to auscultation bilaterally Abd: soft, nontender   Previous Pelvic  Exam showed:  Normal external female genitalia; Bartholin's and Skene's glands normal in appearance; urethral meatus normal in appearance, no urethral masses or discharge.    CST: negative   Speculum exam reveals normal vaginal    s/p hysterectomy: Speculum exam reveals normal vaginal mucosa with  atrophy and normal vaginal cuff.  Adnexa no mass, fullness, tenderness.   Fullness palpated along the length of the urethra.      Pelvic floor strength II/V   Pelvic floor musculature: Right levator non-tender, Right obturator non-tender, Left levator non-tender, Left obturator non-tender   POP-Q:    POP-Q   -3                                            Aa   -3                                           Ba   -6.5                                              C    4                                            Gh   4                                            Pb   7                                            tvl    0                                            Ap   0                                            Bp                                                  D           Rectal Exam:  Normal external rectum    Assessment/ Plan  Assessment: The patient is a 58 y.o. year old scheduled to undergo  Exam under anesthesia, urethral diverticulectomy, cystoscopy, rectus fascial sling . Verbal consent was obtained for these procedures.  Plan: General Surgical Consent: The patient has previously been counseled on alternative treatments, and the  decision by the patient and provider was to proceed with the procedure listed above.  For all procedures, there are risks of bleeding, infection, damage to surrounding organs including but not limited to bowel, bladder, blood vessels, ureters and nerves, and need for further surgery if an injury were to occur. These risks are all low with minimally invasive surgery.   There are risks of numbness and weakness at any body site or buttock/rectal pain.  It is possible that baseline pain can be worsened by surgery, either with or without mesh. If surgery is vaginal, there is also a low risk of possible conversion to laparoscopy or open abdominal incision where indicated. Very rare risks include blood transfusion, blood clot, heart attack, pneumonia, or death.   There is also a risk of short-term postoperative urinary retention with need to use a catheter. About half of patients need to go home from surgery with a catheter, which is then later removed in the office. The risk of long-term need for a catheter is very low. There is also a risk of worsening of overactive bladder.   Sling: The effectiveness of a midurethral vaginal mesh sling is approximately 85%, and thus, there will be times when you may leak urine after surgery, especially if your bladder is full or if you have a strong cough. There is a balance between making the sling tight enough to treat your leakage but not too tight so that you have long-term difficulty emptying your bladder. A mesh sling will not directly treat overactive bladder/urge incontinence and  may worsen it.  There is an FDA safety notification on vaginal mesh procedures for prolapse but NOT mesh slings. We have extensive experience and training with mesh placement and we have close postoperative follow up to identify any potential complications from mesh. It is important to realize that this mesh is a permanent implant that cannot be easily removed. There are rare risks of mesh exposure (2-4%), pain with intercourse (0-7%), and infection (<1%). The risk of mesh exposure if more likely in a woman with risks for poor healing (prior radiation, poorly controlled diabetes, or immunocompromised). The risk of new or worsened chronic pain after mesh implant is more common in women with baseline chronic pain and/or poorly controlled anxiety or depression. Approximately 2-4% of patients will experience longer-term post-operative voiding dysfunction that may require surgical revision of the sling. We also reviewed that postoperatively, her stream may not be as strong as before surgery.    We discussed consent for blood products. Risks for blood transfusion include allergic reactions, other reactions that can affect different body organs and managed accordingly, transmission of infectious diseases such as HIV or Hepatitis. However, the blood is screened. Patient consents for blood products.  Pre-operative instructions:  She was instructed to not take Aspirin/NSAIDs x 7days prior to surgery.   Antibiotic prophylaxis was ordered as indicated.  Catheter use: Patient will go home with foley if needed after post-operative voiding trial.  Post-operative instructions:  She was provided with specific post-operative instructions, including precautions and signs/symptoms for which we would recommend contacting us, in addition to daytime and after-hours contact phone numbers. This was provided on a handout.   Post-operative medications: Prescriptions for motrin, tylenol, miralax, and oxycodone were sent to her  pharmacy. Discussed using ibuprofen and tylenol on a schedule to limit use of narcotics.   Laboratory testing:  We will check labs: As requested by anesthesia   Preoperative clearance:  She does not require surgical clearance.  Post-operative follow-up:  A post-operative appointment will be made for 6 weeks from the date of surgery. If she needs a post-operative nurse visit for a voiding trial, that will be set up after she leaves the hospital.    Patient will call the clinic or use MyChart should anything change or any new issues arise.   Selmer Dominion, NP

## 2023-08-02 NOTE — H&P (Signed)
[facility] H&P  Subjective Chief Complaint: Kristina Reed presents for a preoperative encounter.   History of Present Illness: Kristina Reed is a 57 y.o. female who presents for preoperative visit.  She is scheduled to undergo Exam under anesthesia, urethral diverticulectomy, cystoscopy, rectus fascial sling  on 08/30/23.  Her symptoms include Urethral pain and stress urinary incontinence, and she was was found to have Stage 0 anterior, Stage II posterior, Stage I apical prolapse.   Urodynamics showed: Deferred  Past Medical History:  Diagnosis Date   Anxiety    Arthritis    right knee and right elbow   Cancer (HCC)    Cervical CA with partial hysterectomy.   Chest pain    a. 02/2023 Cor CTA: Ca2+ = 0. Nl cors.   Cognitive impairment, mild, so stated    Depression    Diverticulosis    Dyspnea on exertion    a. 02/2023 Echo: EF 60-65%, no rwma, nl RV size/fxn.   Eosinophilic esophagitis 09/03/2017   Biopsy Dec 2018   Esophageal dysphagia    Family history of breast cancer 05/2021   cancer genetic testing letter sent   Gallstones    GERD (gastroesophageal reflux disease)    History of cervical cancer    Hx of cervical cancer 01/30/2016   Hypertension    Hypothyroidism    Palpitations    a. 02/2023 Zio: Predominantly RSR @ 82 (48-162). Rare PACs/PVCs. No sustained arrhythmias/pauses. Triggered events = RSR and sinus tach.   Sleep apnea    does not use a C-PAP   Status post partial hysterectomy    Due to Cervical CA   Vaginal inclusion cyst      Past Surgical History:  Procedure Laterality Date   BREAST BIOPSY Bilateral 01/01/2016   Fibroadenoma   COLONOSCOPY WITH PROPOFOL N/A 06/27/2015   Procedure: COLONOSCOPY WITH PROPOFOL;  Surgeon: Elnita Maxwell, MD;  Location: [facility] ENDOSCOPY;  Service: Endoscopy;  Laterality: N/A;   COLONOSCOPY WITH PROPOFOL N/A 09/23/2017   Procedure: COLONOSCOPY WITH PROPOFOL;  Surgeon: Wyline Mood, MD;  Location: [facility]  ENDOSCOPY;  Service: Gastroenterology;  Laterality: N/A;   COLONOSCOPY WITH PROPOFOL N/A 06/09/2021   Procedure: COLONOSCOPY WITH PROPOFOL;  Surgeon: Toney Reil, MD;  Location: [facility] ENDOSCOPY;  Service: Gastroenterology;  Laterality: N/A;   ESOPHAGOGASTRODUODENOSCOPY (EGD) WITH PROPOFOL N/A 07/22/2017   Procedure: ESOPHAGOGASTRODUODENOSCOPY (EGD) WITH PROPOFOL;  Surgeon: Wyline Mood, MD;  Location: [facility] ENDOSCOPY;  Service: Gastroenterology;  Laterality: N/A;   ESOPHAGOGASTRODUODENOSCOPY (EGD) WITH PROPOFOL N/A 03/31/2018   Procedure: ESOPHAGOGASTRODUODENOSCOPY (EGD) WITH PROPOFOL;  Surgeon: Toney Reil, MD;  Location: [facility] ENDOSCOPY;  Service: Gastroenterology;  Laterality: N/A;   ESOPHAGOGASTRODUODENOSCOPY (EGD) WITH PROPOFOL N/A 08/25/2021   Procedure: ESOPHAGOGASTRODUODENOSCOPY (EGD) WITH PROPOFOL;  Surgeon: Toney Reil, MD;  Location: [facility] ENDOSCOPY;  Service: Gastroenterology;  Laterality: N/A;   ESOPHAGOGASTRODUODENOSCOPY (EGD) WITH PROPOFOL N/A 11/20/2022   Procedure: ESOPHAGOGASTRODUODENOSCOPY (EGD) WITH PROPOFOL;  Surgeon: Toney Reil, MD;  Location: [facility] ENDOSCOPY;  Service: Gastroenterology;  Laterality: N/A;   KNEE ARTHROSCOPY Right 07/22/2016   Procedure: ARTHROSCOPY KNEE debridement microfracture;  Surgeon: Erin Sons, MD;  Location: [facility] ORS;  Service: Orthopedics;  Laterality: Right;   KNEE ARTHROSCOPY WITH MEDIAL MENISECTOMY Left 11/10/2017   Procedure: KNEE ARTHROSCOPY WITH PARTIAL MEDIAL MENISECTOMY&patella femoral debridement, & ablation;  Surgeon: Erin Sons, MD;  Location: [facility] ORS;  Service: Orthopedics;  Laterality: Left;   TOTAL KNEE ARTHROPLASTY Right 11/24/2021   Procedure: TOTAL KNEE ARTHROPLASTY;  Surgeon: Odis Luster,  Dola Argyle, MD;  Location: [facility] ORS;  Service: Orthopedics;  Laterality: Right;   TUBAL LIGATION     VAGINAL HYSTERECTOMY     partial hysterectomy    is allergic to prednisone, methylprednisolone, and percocet  [oxycodone-acetaminophen].   Family History  Problem Relation Age of Onset   Diabetes Mother    Arrhythmia Mother        Pacemaker   Lung cancer Father 89   Breast cancer Maternal Grandmother 37   Cancer Paternal Grandmother        cancer?   Breast cancer Maternal Aunt        80s   Cirrhosis Cousin    Breast cancer Cousin    Breast cancer Other        x4    Social History   Tobacco Use   Smoking status: Never    Passive exposure: Never   Smokeless tobacco: Never  Vaping Use   Vaping status: Never Used  Substance Use Topics   Alcohol use: Not Currently   Drug use: Never     Review of Systems was negative for a full 10 system review except as noted in the History of Present Illness.  No current facility-administered medications for this encounter.  Current Outpatient Medications:    acetaminophen (TYLENOL) 500 MG tablet, Take 1 tablet (500 mg total) by mouth every 6 (six) hours as needed (pain)., Disp: 30 tablet, Rfl: 0   amitriptyline (ELAVIL) 25 MG tablet, TAKE 1 TABLET BY MOUTH AT BEDTIME, Disp: 30 tablet, Rfl: 0   ciprofloxacin-dexamethasone (CIPRODEX) OTIC suspension, Place 4 drops into the left ear 2 (two) times daily., Disp: 7.5 mL, Rfl: 0   estradiol (ESTRACE) 0.5 MG tablet, Take 1 tablet (0.5 mg total) by mouth daily., Disp: 90 tablet, Rfl: 3   fluconazole (DIFLUCAN) 100 MG tablet, Take 2 tablets (200 mg total) by mouth daily., Disp: 28 tablet, Rfl: 0   FLUoxetine (PROZAC) 20 MG capsule, Take 1 capsule (20 mg total) by mouth daily., Disp: 90 capsule, Rfl: 1   ibuprofen (ADVIL) 600 MG tablet, Take 1 tablet (600 mg total) by mouth every 6 (six) hours as needed., Disp: 30 tablet, Rfl: 0   levothyroxine (SYNTHROID) 75 MCG tablet, Take 1 tablet (75 mcg total) by mouth daily before breakfast., Disp: 90 tablet, Rfl: 1   lisinopril (ZESTRIL) 5 MG tablet, Take 1 tablet by mouth once daily, Disp: 90 tablet, Rfl: 1   meloxicam (MOBIC) 15 MG tablet, Take 15 mg by mouth  daily., Disp: , Rfl:    metoprolol succinate (TOPROL-XL) 25 MG 24 hr tablet, Take 1 tablet (25 mg total) by mouth daily. Take with or immediately following a meal., Disp: 90 tablet, Rfl: 3   omeprazole (PRILOSEC) 40 MG capsule, Take 1 capsule (40 mg total) by mouth 2 (two) times daily before a meal., Disp: 60 capsule, Rfl: 2   polyethylene glycol powder (GLYCOLAX/MIRALAX) 17 GM/SCOOP powder, Take 17 g by mouth daily. Drink 17g (1 scoop) dissolved in water per day., Disp: 255 g, Rfl: 0   tiZANidine (ZANAFLEX) 4 MG tablet, Take 4 mg by mouth at bedtime., Disp: , Rfl:    traMADol (ULTRAM) 50 MG tablet, Take 1 tablet (50 mg total) by mouth every 8 (eight) hours as needed for up to 5 days., Disp: 15 tablet, Rfl: 0   Objective There were no vitals filed for this visit.   Gen: NAD CV: S1 S2 RRR Lungs: Clear to auscultation bilaterally Abd: soft, nontender  Previous Pelvic Exam showed:  Normal external female genitalia; Bartholin's and Skene's glands normal in appearance; urethral meatus normal in appearance, no urethral masses or discharge.    CST: negative   Speculum exam reveals normal vaginal    s/p hysterectomy: Speculum exam reveals normal vaginal mucosa with  atrophy and normal vaginal cuff.  Adnexa no mass, fullness, tenderness.   Fullness palpated along the length of the urethra.      Pelvic floor strength II/V   Pelvic floor musculature: Right levator non-tender, Right obturator non-tender, Left levator non-tender, Left obturator non-tender   POP-Q:    POP-Q   -3                                            Aa   -3                                           Ba   -6.5                                              C    4                                            Gh   4                                            Pb   7                                            tvl    0                                            Ap   0                                            Bp                                                   D          Rectal Exam:  Normal external rectum    Assessment/ Plan  Assessment: The patient is a 57 y.o. year old scheduled to undergo  Exam under anesthesia, urethral diverticulectomy, cystoscopy, rectus fascial sling . Verbal consent was obtained for these procedures.

## 2023-08-03 ENCOUNTER — Other Ambulatory Visit: Payer: Medicaid Other

## 2023-08-03 DIAGNOSIS — Z7689 Persons encountering health services in other specified circumstances: Secondary | ICD-10-CM | POA: Diagnosis not present

## 2023-08-14 ENCOUNTER — Emergency Department
Admission: EM | Admit: 2023-08-14 | Discharge: 2023-08-14 | Disposition: A | Discharge status: Home / Self Care | Payer: Medicaid Other | Attending: Emergency Medicine | Admitting: Emergency Medicine

## 2023-08-14 ENCOUNTER — Other Ambulatory Visit: Payer: Self-pay

## 2023-08-14 DIAGNOSIS — R339 Retention of urine, unspecified: Secondary | ICD-10-CM | POA: Diagnosis not present

## 2023-08-14 LAB — CBC
HCT: 35.5 % — ABNORMAL LOW (ref 36.0–46.0)
Hemoglobin: 12 g/dL (ref 12.0–15.0)
MCH: 28.1 pg (ref 26.0–34.0)
MCHC: 33.8 g/dL (ref 30.0–36.0)
MCV: 83.1 fL (ref 80.0–100.0)
Platelets: 235 10*3/uL (ref 150–400)
RBC: 4.27 MIL/uL (ref 3.87–5.11)
RDW: 13.2 % (ref 11.5–15.5)
WBC: 7.4 10*3/uL (ref 4.0–10.5)
nRBC: 0 % (ref 0.0–0.2)

## 2023-08-14 LAB — BASIC METABOLIC PANEL
Anion gap: 9 (ref 5–15)
BUN: 8 mg/dL (ref 6–20)
CO2: 25 mmol/L (ref 22–32)
Calcium: 9.2 mg/dL (ref 8.9–10.3)
Chloride: 100 mmol/L (ref 98–111)
Creatinine, Ser: 0.82 mg/dL (ref 0.44–1.00)
GFR, Estimated: >60 mL/min (ref >60)
Glucose, Bld: 152 mg/dL — ABNORMAL HIGH (ref 70–99)
Potassium: 3.6 mmol/L (ref 3.5–5.1)
Sodium: 134 mmol/L — ABNORMAL LOW (ref 135–145)

## 2023-08-14 LAB — URINALYSIS, ROUTINE W REFLEX MICROSCOPIC
Bilirubin Urine: NEGATIVE
Glucose, UA: NEGATIVE mg/dL
Hgb urine dipstick: NEGATIVE
Ketones, ur: NEGATIVE mg/dL
Leukocytes,Ua: NEGATIVE
Nitrite: NEGATIVE
Protein, ur: NEGATIVE mg/dL
Specific Gravity, Urine: 1.002 — ABNORMAL LOW (ref 1.005–1.030)
pH: 7 (ref 5.0–8.0)

## 2023-08-14 NOTE — ED Notes (Signed)
Provided leg bag for foley catheter.

## 2023-08-14 NOTE — ED Provider Notes (Signed)
[facility] Provider Note    Event Date/Time   First MD Initiated Contact with Patient 08/14/23 1140     (approximate)   History   Urinary Retention   HPI  Kristina Reed is a 57 y.o. female presents with complaints of urinary retention.  Patient reports she has not urinated since Wednesday, she reports she feels that her bladder is very full.  She reports this has happened before secondary to a bladder cyst which she has apparently planned procedure for.  No fevers, no nausea vomiting, otherwise feels well     Physical Exam   Triage Vital Signs: ED Triage Vitals  Encounter Vitals Group     BP 08/14/23 1135 (!) 135/100     Systolic BP Percentile --      Diastolic BP Percentile --      Pulse Rate 08/14/23 1135 99     Resp 08/14/23 1135 18     Temp 08/14/23 1135 98.3 F (36.8 C)     Temp src --      SpO2 08/14/23 1135 99 %     Weight 08/14/23 1136 80.3 kg (177 lb)     Height 08/14/23 1136 1.549 m (5\' 1" )     Head Circumference --      Peak Flow --      Pain Score 08/14/23 1134 0     Pain Loc --      Pain Education --      Exclude from Growth Chart --     Most recent vital signs: Vitals:   08/14/23 1135  BP: (!) 135/100  Pulse: 99  Resp: 18  Temp: 98.3 F (36.8 C)  SpO2: 99%     General: Awake, no distress.  CV:  Good peripheral perfusion.  Resp:  Normal effort.  Abd:  Positive suprapubic distention Other:     ED Results / Procedures / Treatments   Labs (all labs ordered are listed, but only abnormal results are displayed) Labs Reviewed  URINALYSIS, ROUTINE W REFLEX MICROSCOPIC - Abnormal; Notable for the following components:      Result Value   Color, Urine STRAW (*)    APPearance CLEAR (*)    Specific Gravity, Urine 1.002 (*)    All other components within normal limits  CBC - Abnormal; Notable for the following components:   HCT 35.5 (*)    All other components within normal limits  BASIC METABOLIC PANEL - Abnormal;  Notable for the following components:   Sodium 134 (*)    Glucose, Bld 152 (*)    All other components within normal limits     EKG     RADIOLOGY Bladder scan demonstrates 214 however bladder feels larger than last    PROCEDURES:  Critical Care performed:   Procedures   MEDICATIONS ORDERED IN ED: Medications - No data to display   IMPRESSION / MDM / ASSESSMENT AND PLAN / ED COURSE  I reviewed the triage vital signs and the nursing notes. Patient's presentation is most consistent with acute presentation with potential threat to life or bodily function.  Patient presents with urinary retention, differential includes urinary retention, infection, kidney failure  Will obtain chemistry, CBC, urinalysis after placing Foley catheter  Lab work urinalysis is reassuring, Foley catheter drained greater than 700 cc, no occasion for admission, proper for discharge with follow-up with her urologist      FINAL CLINICAL IMPRESSION(S) / ED DIAGNOSES   Final diagnoses:  Urinary retention  Rx / DC Orders   ED Discharge Orders     None        Note:  This document was prepared using Dragon voice recognition software and may include unintentional dictation errors.   Jene Every, MD 08/14/23 [phone]

## 2023-08-14 NOTE — ED Notes (Signed)
Unable to bladder scan in triage. Pt taken to ED 54 to bladder scan

## 2023-08-14 NOTE — ED Triage Notes (Signed)
Pt comes with c/o urinary retention. Pt states this started on Wed. Pt states hx of this and scheduled for surgery.  Pt states no urination since Wed and not even a little. Pt states no pain just bloated.

## 2023-08-15 DIAGNOSIS — Z419 Encounter for procedure for purposes other than remedying health state, unspecified: Secondary | ICD-10-CM | POA: Diagnosis not present

## 2023-08-16 ENCOUNTER — Telehealth: Payer: Self-pay

## 2023-08-16 NOTE — Telephone Encounter (Signed)
This is the second time this has happened. Patient will be wearing a catheter longer term anyway with [name] due to her surgery being to address urethral cyst. Should we just have her schedule to come in and remove the catheter later this week similar to a voiding trial? Would you prefer I see her and remove it?

## 2023-08-16 NOTE — Telephone Encounter (Signed)
Gave patient an update, to let her know we haven't forgot her.

## 2023-08-16 NOTE — Telephone Encounter (Signed)
Patient has been informed of recommendations and will come to the office tomorrow at 1pm for a voiding trial followed by her follow up with Dr. Florian Buff

## 2023-08-16 NOTE — Telephone Encounter (Signed)
Kristina Reed had to go to the ER over the thanksgiving holiday due to urinary retention. She currently has a catheter in place and was instructed to follow up with our office. Se is scheduled for surgery on 08-30-2023. Please advise.

## 2023-08-17 ENCOUNTER — Ambulatory Visit (INDEPENDENT_AMBULATORY_CARE_PROVIDER_SITE_OTHER): Payer: Medicaid Other | Admitting: Obstetrics and Gynecology

## 2023-08-17 ENCOUNTER — Encounter: Payer: Self-pay | Admitting: Obstetrics and Gynecology

## 2023-08-17 ENCOUNTER — Ambulatory Visit
Admission: RE | Admit: 2023-08-17 | Discharge: 2023-08-17 | Disposition: A | Discharge status: Home / Self Care | Payer: Medicaid Other | Source: Ambulatory Visit | Attending: Neurology | Admitting: Neurology

## 2023-08-17 VITALS — BP 115/82 | HR 83

## 2023-08-17 DIAGNOSIS — R338 Other retention of urine: Secondary | ICD-10-CM | POA: Diagnosis not present

## 2023-08-17 DIAGNOSIS — R413 Other amnesia: Secondary | ICD-10-CM

## 2023-08-17 DIAGNOSIS — N361 Urethral diverticulum: Secondary | ICD-10-CM

## 2023-08-17 DIAGNOSIS — Z7689 Persons encountering health services in other specified circumstances: Secondary | ICD-10-CM | POA: Diagnosis not present

## 2023-08-17 NOTE — Progress Notes (Signed)
[facility] Return Visit  SUBJECTIVE  History of Present Illness: Kristina Reed is a 57 y.o. female seen in follow-up for urinary retention. She presented to the ER on 11/30 and had a catheter placed because she could not void. UA at that visit was negative. She came to the office today and had a successful voiding trial.  She is scheduled for a urethral diverticulectomy with rectus fascial sling 12/16.   Past Medical History: Patient  has a past medical history of Anxiety, Arthritis, Cancer (HCC), Chest pain, Cognitive impairment, mild, so stated, Depression, Diverticulosis, Dyspnea on exertion, Eosinophilic esophagitis (09/03/2017), Esophageal dysphagia, Family history of breast cancer (05/2021), Gallstones, GERD (gastroesophageal reflux disease), History of cervical cancer, cervical cancer (01/30/2016), Hypertension, Hypothyroidism, Palpitations, Sleep apnea, Status post partial hysterectomy, and Vaginal inclusion cyst.   Past Surgical History: She  has a past surgical history that includes Vaginal hysterectomy; Colonoscopy with propofol (N/A, 06/27/2015); Tubal ligation; Knee arthroscopy (Right, 07/22/2016); Esophagogastroduodenoscopy (egd) with propofol (N/A, 07/22/2017); Colonoscopy with propofol (N/A, 09/23/2017); Knee arthroscopy with medial menisectomy (Left, 11/10/2017); Esophagogastroduodenoscopy (egd) with propofol (N/A, 03/31/2018); Breast biopsy (Bilateral, 01/01/2016); Colonoscopy with propofol (N/A, 06/09/2021); Esophagogastroduodenoscopy (egd) with propofol (N/A, 08/25/2021); Total knee arthroplasty (Right, 11/24/2021); and Esophagogastroduodenoscopy (egd) with propofol (N/A, 11/20/2022).   Medications: She has a current medication list which includes the following prescription(s): acetaminophen, amitriptyline, ciprofloxacin-dexamethasone, estradiol, fluconazole, fluoxetine, ibuprofen, levothyroxine, lisinopril, meloxicam, metoprolol succinate, omeprazole, polyethylene  glycol powder, and tizanidine.   Allergies: Patient is allergic to prednisone, methylprednisolone, and percocet [oxycodone-acetaminophen].   Social History: Patient  reports that she has never smoked. She has never been exposed to tobacco smoke. She has never used smokeless tobacco. She reports that she does not currently use alcohol. She reports that she does not use drugs.      OBJECTIVE     Physical Exam: Vitals:   08/17/23 1304  BP: 115/82  Pulse: 83   Gen: No apparent distress, A&O x 3.  Detailed Urogynecologic Evaluation:  Deferred.    ASSESSMENT AND PLAN    Ms. Lukasik is a 57 y.o. with:  1. Urethral diverticulum   2. Acute urinary retention    - Unclear etiology for acute urinary retention but we discussed that it would be best to just proceed with the urethral diverticulectomy and defer the rectus fascial sling. I explained that the sling could potentially cause acute urinary retention as well, so it would be best to stage treatment. Since it will be staged, can also consider mesh sling once healing from diverticulum is complete.   General Surgical Risks: For all procedures, there are risks of bleeding, infection, damage to surrounding organs including but not limited to bowel, bladder, blood vessels, ureters and nerves, and need for further surgery if an injury were to occur. These risks are all low with minimally invasive surgery.    There are risks of numbness and weakness at any body site or buttock/rectal pain.  It is possible that baseline pain can be worsened by surgery, either with or without mesh. If surgery is vaginal, there is also a low risk of possible conversion to laparoscopy or open abdominal incision where indicated. Very rare risks include blood transfusion, blood clot, heart attack, pneumonia, or death.    There is also a risk of short-term postoperative urinary retention with need to use a catheter. She will have a catheter for approximately 2 weeks. We  reviewed the risk of worsening overactive bladder and need for possible treatment after surgery.  Reviewed pre operative instructions with patient, previously had pre op exam.    Marguerita Beards, MD   Time spent: I spent 25 minutes dedicated to the care of this patient on the date of this encounter to include pre-visit review of records, face-to-face time with the patient and post visit documentation.

## 2023-08-24 ENCOUNTER — Ambulatory Visit: Payer: Medicaid Other | Admitting: Internal Medicine

## 2023-08-24 ENCOUNTER — Ambulatory Visit: Payer: Self-pay

## 2023-08-24 ENCOUNTER — Encounter (HOSPITAL_BASED_OUTPATIENT_CLINIC_OR_DEPARTMENT_OTHER): Payer: Self-pay | Admitting: Obstetrics and Gynecology

## 2023-08-24 NOTE — Telephone Encounter (Signed)
Pt is going to [facility]

## 2023-08-24 NOTE — Telephone Encounter (Signed)
Appointment with whomever have opening

## 2023-08-24 NOTE — Telephone Encounter (Signed)
  Chief Complaint: Left foot pain top of foot Symptoms: pain Frequency: yesterday Pertinent Negatives: Patient denies redness, numbness Disposition: [] ED /[x] Urgent Care (no appt availability in office) / [] Appointment(In office/virtual)/ []  [facility]/ [] Home Care/ [] Refused Recommended Disposition /[]  Mobile Bus/ []  Follow-up with PCP Additional Notes: Pt states that foot started hurting yesterday, and has found no relief with "BC". Pt states it hurts so much she was crying and did not sleep last night.  No known injury. No appts in office. Pt will go to [facility].  Reason for Disposition  [1] SEVERE pain (e.g., excruciating, unable to do any normal activities) AND [2] not improved after 2 hours of pain medicine  Answer Assessment - Initial Assessment Questions 1. ONSET: "When did the pain start?"      yesterday 2. LOCATION: "Where is the pain located?"      Top of left foot 3. PAIN: "How bad is the pain?"    (Scale 1-10; or mild, moderate, severe)  - MILD (1-3): doesn't interfere with normal activities.   - MODERATE (4-7): interferes with normal activities (e.g., work or school) or awakens from sleep, limping.   - SEVERE (8-10): excruciating pain, unable to do any normal activities, unable to walk.      Moderate 4. WORK OR EXERCISE: "Has there been any recent work or exercise that involved this part of the body?"      no 5. CAUSE: "What do you think is causing the foot pain?"     no 6. OTHER SYMPTOMS: "Do you have any other symptoms?" (e.g., leg pain, rash, fever, numbness)     no  Protocols used: Foot Pain-A-AH

## 2023-08-24 NOTE — Progress Notes (Signed)
Spoke w/ via phone for pre-op interview--- Kristina Reed Lab needs dos---- ISTAT        Lab results------Current EKG in Epic dated 06/29/23. COVID test -----patient states asymptomatic no test needed Arrive at -------0530 NPO after MN NO Solid Food.   Med rec completed Medications to take morning of surgery -----Levothyroxine and Prozac Diabetic medication ----- Patient instructed no nail polish to be worn day of surgery Patient instructed to bring photo id and insurance card day of surgery Patient aware to have Driver (ride ) / caregiver    for 24 hours after surgery - Victorino Dike Mays-boyfriends daughter Patient Special Instructions ----- Pre-Op special Instructions ----- Patient verbalized understanding of instructions that were given at this phone interview. Patient denies chest pain, sob, fever, cough at the interview.

## 2023-08-26 DIAGNOSIS — S93602A Unspecified sprain of left foot, initial encounter: Secondary | ICD-10-CM | POA: Diagnosis not present

## 2023-08-26 DIAGNOSIS — Z7689 Persons encountering health services in other specified circumstances: Secondary | ICD-10-CM | POA: Diagnosis not present

## 2023-08-26 DIAGNOSIS — M79672 Pain in left foot: Secondary | ICD-10-CM | POA: Diagnosis not present

## 2023-08-27 ENCOUNTER — Telehealth: Payer: Self-pay | Admitting: *Deleted

## 2023-08-27 NOTE — Telephone Encounter (Signed)
TC to patient Verified DOB.  Pt informed surgery is being cancelled for Monday 08/30/23 and we will get this rescheduled as soon as possible.  I advised I would call back next week for follow up on rescheduling.  Pt understood.  KWD CMA

## 2023-08-30 ENCOUNTER — Ambulatory Visit (HOSPITAL_BASED_OUTPATIENT_CLINIC_OR_DEPARTMENT_OTHER)
Admission: RE | Admit: 2023-08-30 | Payer: Medicaid Other | Source: Home / Self Care | Admitting: Obstetrics and Gynecology

## 2023-08-30 DIAGNOSIS — I10 Essential (primary) hypertension: Secondary | ICD-10-CM

## 2023-08-30 SURGERY — EXCISION, CYST, PERIURETHRAL
Anesthesia: General

## 2023-08-30 NOTE — Telephone Encounter (Signed)
Spoke with patient regarding surgery cancellation. Reassured that we will schedule her as soon as possible.   Kristina Beards, MD

## 2023-09-15 DIAGNOSIS — Z419 Encounter for procedure for purposes other than remedying health state, unspecified: Secondary | ICD-10-CM | POA: Diagnosis not present

## 2023-09-16 ENCOUNTER — Ambulatory Visit: Payer: Medicaid Other | Admitting: Internal Medicine

## 2023-09-16 DIAGNOSIS — M1712 Unilateral primary osteoarthritis, left knee: Secondary | ICD-10-CM | POA: Diagnosis not present

## 2023-09-16 DIAGNOSIS — Z7689 Persons encountering health services in other specified circumstances: Secondary | ICD-10-CM | POA: Diagnosis not present

## 2023-09-16 DIAGNOSIS — M25562 Pain in left knee: Secondary | ICD-10-CM | POA: Diagnosis not present

## 2023-09-20 ENCOUNTER — Other Ambulatory Visit: Payer: Self-pay | Admitting: Internal Medicine

## 2023-09-20 NOTE — Telephone Encounter (Signed)
 Medication Refill -  Most Recent Primary Care Visit:  Provider: BERNARDO FEND  Department: [facility]  Visit Type: OFFICE VISIT  Date: 07/12/2023  Medication: omeprazole  (PRILOSEC) 40 MG capsule   Has the patient contacted their pharmacy? No   Is this the correct pharmacy for this prescription? Yes If no, delete pharmacy and type the correct one.  This is the patient's preferred pharmacy:  [facility] [address]), [place] - [address] [place]) [place] [address] Phone: [phone] Fax: [phone]   Has the prescription been filled recently? No  Is the patient out of the medication? Yes  Has the patient been seen for an appointment in the last year OR does the patient have an upcoming appointment? Yes  Can we respond through MyChart? No  Agent: Please be advised that Rx refills may take up to 3 business days. We ask that you follow-up with your pharmacy.

## 2023-09-21 ENCOUNTER — Telehealth: Payer: Self-pay | Admitting: Gastroenterology

## 2023-09-21 NOTE — Telephone Encounter (Signed)
 The patient called in requesting to reschedule her appointment because she don't know, and she doesn't remember.

## 2023-09-22 ENCOUNTER — Ambulatory Visit: Payer: Medicaid Other | Admitting: Gastroenterology

## 2023-09-22 NOTE — Telephone Encounter (Signed)
 Requested medication (s) are due for refill today: Yes  Requested medication (s) are on the active medication list: Yes  Last refill:  11/25/22  Future visit scheduled: No  Notes to clinic:  Unable to refill per protocol, last refill by another provider.      Requested Prescriptions  Pending Prescriptions Disp Refills   omeprazole  (PRILOSEC) 40 MG capsule 60 capsule 2    Sig: Take 1 capsule (40 mg total) by mouth 2 (two) times daily before a meal.     Gastroenterology: Proton Pump Inhibitors Passed - 09/22/2023  3:38 PM      Passed - Valid encounter within last 12 months    Recent Outpatient Visits           2 months ago Acute otitis externa of left ear, unspecified type   [facility] Bernardo Fend, DO   2 months ago Mastoid pain, left   [facility] Leavy Mole, PA-C   3 months ago Hordeolum externum of left eye, unspecified eyelid   [facility] Mecum, Erin E, PA-C   3 months ago Acute cystitis without hematuria   [facility] Gareth Mliss FALCON, FNP   4 months ago Left foot pain   [facility] Bernardo Fend, DO       Future Appointments             In 1 month Vanga, Corinn Skiff, MD [facility]

## 2023-09-24 MED ORDER — OMEPRAZOLE 40 MG PO CPDR: 40.0000 mg | DELAYED_RELEASE_CAPSULE | Freq: Every day | ORAL | 2 refills | Status: AC

## 2023-10-01 ENCOUNTER — Telehealth: Payer: Self-pay

## 2023-10-01 NOTE — Telephone Encounter (Signed)
Patient called to cancel surgery appointment on Feb 3rd. Patient states she does not have transportation for that day.

## 2023-10-04 DIAGNOSIS — B351 Tinea unguium: Secondary | ICD-10-CM | POA: Diagnosis not present

## 2023-10-04 DIAGNOSIS — Z7689 Persons encountering health services in other specified circumstances: Secondary | ICD-10-CM | POA: Diagnosis not present

## 2023-10-04 DIAGNOSIS — M79674 Pain in right toe(s): Secondary | ICD-10-CM | POA: Diagnosis not present

## 2023-10-04 DIAGNOSIS — M79675 Pain in left toe(s): Secondary | ICD-10-CM | POA: Diagnosis not present

## 2023-10-12 ENCOUNTER — Encounter: Payer: Medicaid Other | Admitting: Obstetrics and Gynecology

## 2023-10-12 DIAGNOSIS — M25562 Pain in left knee: Secondary | ICD-10-CM | POA: Diagnosis not present

## 2023-10-12 DIAGNOSIS — Z7689 Persons encountering health services in other specified circumstances: Secondary | ICD-10-CM | POA: Diagnosis not present

## 2023-10-12 NOTE — Telephone Encounter (Signed)
Scheduling called and surgery cancelled.

## 2023-10-16 DIAGNOSIS — Z419 Encounter for procedure for purposes other than remedying health state, unspecified: Secondary | ICD-10-CM | POA: Diagnosis not present

## 2023-10-18 ENCOUNTER — Encounter (HOSPITAL_BASED_OUTPATIENT_CLINIC_OR_DEPARTMENT_OTHER): Payer: Self-pay

## 2023-10-18 ENCOUNTER — Telehealth: Payer: Self-pay | Admitting: Obstetrics and Gynecology

## 2023-10-18 ENCOUNTER — Ambulatory Visit (HOSPITAL_BASED_OUTPATIENT_CLINIC_OR_DEPARTMENT_OTHER): Admit: 2023-10-18 | Payer: Medicaid Other | Admitting: Obstetrics and Gynecology

## 2023-10-18 SURGERY — EXCISION URETHRAL CYST
Anesthesia: General

## 2023-10-18 NOTE — Telephone Encounter (Signed)
Pt left vm stating she needs to r/s her surgery

## 2023-10-27 DIAGNOSIS — M25562 Pain in left knee: Secondary | ICD-10-CM | POA: Diagnosis not present

## 2023-10-27 DIAGNOSIS — M23322 Other meniscus derangements, posterior horn of medial meniscus, left knee: Secondary | ICD-10-CM | POA: Diagnosis not present

## 2023-10-27 DIAGNOSIS — M67864 Other specified disorders of tendon, left knee: Secondary | ICD-10-CM | POA: Diagnosis not present

## 2023-10-27 DIAGNOSIS — M2242 Chondromalacia patellae, left knee: Secondary | ICD-10-CM | POA: Diagnosis not present

## 2023-10-27 DIAGNOSIS — Z7689 Persons encountering health services in other specified circumstances: Secondary | ICD-10-CM | POA: Diagnosis not present

## 2023-10-27 DIAGNOSIS — M76892 Other specified enthesopathies of left lower limb, excluding foot: Secondary | ICD-10-CM | POA: Diagnosis not present

## 2023-10-29 DIAGNOSIS — Z96651 Presence of right artificial knee joint: Secondary | ICD-10-CM | POA: Diagnosis not present

## 2023-10-29 DIAGNOSIS — M1712 Unilateral primary osteoarthritis, left knee: Secondary | ICD-10-CM | POA: Diagnosis not present

## 2023-10-29 DIAGNOSIS — Z7689 Persons encountering health services in other specified circumstances: Secondary | ICD-10-CM | POA: Diagnosis not present

## 2023-11-10 ENCOUNTER — Other Ambulatory Visit: Payer: Self-pay

## 2023-11-13 DIAGNOSIS — Z419 Encounter for procedure for purposes other than remedying health state, unspecified: Secondary | ICD-10-CM | POA: Diagnosis not present

## 2023-11-15 ENCOUNTER — Ambulatory Visit (INDEPENDENT_AMBULATORY_CARE_PROVIDER_SITE_OTHER): Payer: Medicaid Other | Admitting: Obstetrics and Gynecology

## 2023-11-15 VITALS — BP 127/84 | HR 70 | Wt 173.2 lb

## 2023-11-15 DIAGNOSIS — R338 Other retention of urine: Secondary | ICD-10-CM

## 2023-11-15 DIAGNOSIS — Z01818 Encounter for other preprocedural examination: Secondary | ICD-10-CM

## 2023-11-15 DIAGNOSIS — N361 Urethral diverticulum: Secondary | ICD-10-CM

## 2023-11-15 DIAGNOSIS — N3281 Overactive bladder: Secondary | ICD-10-CM

## 2023-11-15 DIAGNOSIS — N393 Stress incontinence (female) (male): Secondary | ICD-10-CM

## 2023-11-15 NOTE — H&P (View-Only) (Signed)
 [facility] Pre-Operative H&P  Subjective Chief Complaint: Kristina Reed presents for a preoperative encounter.   History of Present Illness: Kristina Reed is a 58 y.o. female who presents for preoperative visit.  She is scheduled to undergo urethral diverticulectomy with cystourethroscopy rescheduled on 11/29/23.  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   Prior difficulty with transportation Most bothered by UUI 2-3x/day, tried trospium (not covered by insurance), Vesicare 5mg  SUI 2x/day, less bothersome Reduced to 1 can of pepsi, 48oz water, 1 cup of coffee/day BM 2-3x/week, hard. Denies starting fiber supplementation or miralax.  Urinary retention 05/31/23 and 08/14/23 evaluate in ED with catheter placement, reports prior intermittent catheterization over the past 8 years. Denies UTI symptoms or sensation of incomplete emptying today.  Past Medical History:  Diagnosis Date   Anxiety    Arthritis    right knee and right elbow   Cancer (HCC)    Cervical CA with partial hysterectomy.   Chest pain    a. 02/2023 Cor CTA: Ca2+ = 0. Nl cors.   Cognitive impairment, mild, so stated    Depression    Diverticulosis    Dyspnea on exertion    a. 02/2023 Echo: EF 60-65%, no rwma, nl RV size/fxn.   Eosinophilic esophagitis 09/03/2017   Biopsy Dec 2018   Esophageal dysphagia    Family history of breast cancer 05/2021   cancer genetic testing letter sent   Gallstones    GERD (gastroesophageal reflux disease)    History of cervical cancer    Hx of cervical cancer 01/30/2016   Hypertension    Hypothyroidism    Palpitations    a. 02/2023 Zio: Predominantly RSR @ 82 (48-162). Rare PACs/PVCs. No sustained arrhythmias/pauses. Triggered events = RSR and sinus tach.   Sleep apnea    does not use a C-PAP   Status post partial hysterectomy    Due to Cervical CA   Vaginal inclusion cyst       Past Surgical History:  Procedure Laterality Date   BREAST BIOPSY Bilateral 01/01/2016   Fibroadenoma   COLONOSCOPY WITH PROPOFOL N/A 06/27/2015   Procedure: COLONOSCOPY WITH PROPOFOL;  Surgeon: Elnita Maxwell, MD;  Location: [facility] ENDOSCOPY;  Service: Endoscopy;  Laterality: N/A;   COLONOSCOPY WITH PROPOFOL N/A 09/23/2017   Procedure: COLONOSCOPY WITH PROPOFOL;  Surgeon: Wyline Mood, MD;  Location[facility] ENDOSCOPY;  Service: Gastroenterology;  Laterality: N/A;   COLONOSCOPY WITH PROPOFOL N/A 06/09/2021   Procedure: COLONOSCOPY WITH PROPOFOL;  Surgeon: Toney Reil, MD;  Location: [facility] ENDOSCOPY;  Service: Gastroenterology;  Laterality: N/A;   ESOPHAGOGASTRODUODENOSCOPY (EGD) WITH PROPOFOL N/A 07/22/2017   Procedure: ESOPHAGOGASTRODUODENOSCOPY (EGD) WITH PROPOFOL;  Surgeon: Wyline Mood, MD;  Location: [facility] ENDOSCOPY;  Service: Gastroenterology;  Laterality: N/A;   ESOPHAGOGASTRODUODENOSCOPY (EGD) WITH PROPOFOL N/A 03/31/2018   Procedure: ESOPHAGOGASTRODUODENOSCOPY (EGD) WITH PROPOFOL;  Surgeon: Toney Reil, MD;  Location: [facility] ENDOSCOPY;  Service: Gastroenterology;  Laterality: N/A;   ESOPHAGOGASTRODUODENOSCOPY (EGD) WITH PROPOFOL N/A 08/25/2021   Procedure: ESOPHAGOGASTRODUODENOSCOPY (EGD) WITH PROPOFOL;  Surgeon: Toney Reil, MD;  Location: [facility] ENDOSCOPY;  Service: Gastroenterology;  Laterality: N/A;   ESOPHAGOGASTRODUODENOSCOPY (EGD) WITH PROPOFOL N/A 11/20/2022   Procedure: ESOPHAGOGASTRODUODENOSCOPY (EGD) WITH PROPOFOL;  Surgeon: Toney Reil, MD;  Location: [facility] ENDOSCOPY;  Service: Gastroenterology;  Laterality: N/A;   KNEE ARTHROSCOPY Right 07/22/2016   Procedure: ARTHROSCOPY KNEE debridement microfracture;  Surgeon: Erin Sons, MD;  Location:  [facility] ORS;  Service: Orthopedics;  Laterality: Right;   KNEE ARTHROSCOPY WITH MEDIAL MENISECTOMY Left 11/10/2017   Procedure: KNEE ARTHROSCOPY WITH PARTIAL MEDIAL MENISECTOMY&patella femoral debridement, &  ablation;  Surgeon: Erin Sons, MD;  Location: [facility] ORS;  Service: Orthopedics;  Laterality: Left;   TOTAL KNEE ARTHROPLASTY Right 11/24/2021   Procedure: TOTAL KNEE ARTHROPLASTY;  Surgeon: Lyndle Herrlich, MD;  Location: [facility] ORS;  Service: Orthopedics;  Laterality: Right;   TUBAL LIGATION     VAGINAL HYSTERECTOMY     partial hysterectomy    is allergic to prednisone, methylprednisolone, and percocet [oxycodone-acetaminophen].   Family History  Problem Relation Age of Onset   Diabetes Mother    Arrhythmia Mother        Pacemaker   Lung cancer Father 26   Breast cancer Maternal Grandmother 60   Cancer Paternal Grandmother        cancer?   Breast cancer Maternal Aunt        80s   Cirrhosis Cousin    Breast cancer Cousin    Breast cancer Other        x4    Social History   Tobacco Use   Smoking status: Never    Passive exposure: Never   Smokeless tobacco: Never  Vaping Use   Vaping status: Never Used  Substance Use Topics   Alcohol use: Not Currently   Drug use: Never     Review of Systems was negative for a full 10 system review except as noted in the History of Present Illness.   Current Outpatient Medications:    acetaminophen (TYLENOL) 500 MG tablet, Take 1 tablet (500 mg total) by mouth every 6 (six) hours as needed (pain)., Disp: 30 tablet, Rfl: 0   amitriptyline (ELAVIL) 25 MG tablet, TAKE 1 TABLET BY MOUTH AT BEDTIME, Disp: 30 tablet, Rfl: 0   ciprofloxacin-dexamethasone (CIPRODEX) OTIC suspension, Place 4 drops into the left ear 2 (two) times daily., Disp: 7.5 mL, Rfl: 0   estradiol (ESTRACE) 0.5 MG tablet, Take 1 tablet (0.5 mg total) by mouth daily., Disp: 90 tablet, Rfl: 3   fluconazole (DIFLUCAN) 100 MG tablet, Take 2 tablets (200 mg total) by mouth daily., Disp: 28 tablet, Rfl: 0   FLUoxetine (PROZAC) 20 MG capsule, Take 1 capsule (20 mg total) by mouth daily., Disp: 90 capsule, Rfl: 1   ibuprofen (ADVIL) 600 MG tablet, Take 1 tablet (600 mg total)  by mouth every 6 (six) hours as needed., Disp: 30 tablet, Rfl: 0   levothyroxine (SYNTHROID) 75 MCG tablet, Take 1 tablet (75 mcg total) by mouth daily before breakfast., Disp: 90 tablet, Rfl: 1   lisinopril (ZESTRIL) 5 MG tablet, Take 1 tablet by mouth once daily (Patient taking differently: Take 5 mg by mouth daily. Pt not taking), Disp: 90 tablet, Rfl: 1   meloxicam (MOBIC) 15 MG tablet, Take 15 mg by mouth daily., Disp: , Rfl:    omeprazole (PRILOSEC) 40 MG capsule, Take 1 capsule (40 mg total) by mouth daily., Disp: 60 capsule, Rfl: 2   polyethylene glycol powder (GLYCOLAX/MIRALAX) 17 GM/SCOOP powder, Take 17 g by mouth daily. Drink 17g (1 scoop) dissolved in water per day., Disp: 255 g, Rfl: 0   tiZANidine (ZANAFLEX) 4 MG tablet, Take 4 mg by mouth at bedtime. Pt not taking, Disp: , Rfl:    traZODone (DESYREL) 100 MG tablet, Take 100 mg by mouth at bedtime., Disp: , Rfl:    venlafaxine XR (EFFEXOR-XR) 75 MG 24 hr capsule,  Take 1 capsule by mouth daily., Disp: , Rfl:    metoprolol succinate (TOPROL-XL) 25 MG 24 hr tablet, Take 1 tablet (25 mg total) by mouth daily. Take with or immediately following a meal. (Patient taking differently: Take 25 mg by mouth daily. Take with or immediately following a meal.  Pt not taking), Disp: 90 tablet, Rfl: 3   Objective Vitals:   11/15/23 0849  BP: 127/84  Pulse: 70    Gen: NAD CV: S1 S2 RRR Lungs: Clear to auscultation bilaterally Abd: soft, nontender   Previous Pelvic Exam 05/18/23 showed: Fullness palpated along the length of the urethra.   Assessment/ Plan  Assessment: The patient is a 58 y.o. year old scheduled to undergo urethral diverticulectomy with cystourethroscopy. Verbal consent was obtained for these procedures.  Plan: General Surgical Consent: The patient has previously been counseled on alternative treatments, and the decision by the patient and provider was to proceed with the procedure listed above.  For all procedures,  there are risks of bleeding, infection, damage to surrounding organs including but not limited to bowel, bladder, blood vessels, ureters and nerves, and need for further surgery if an injury were to occur. These risks are all low with minimally invasive surgery.   There are risks of numbness and weakness at any body site or buttock/rectal pain.  It is possible that baseline pain can be worsened by surgery, either with or without mesh. If surgery is vaginal, there is also a low risk of possible conversion to laparoscopy or open abdominal incision where indicated. Very rare risks include blood transfusion, blood clot, heart attack, pneumonia, or death.   There is also a risk of short-term postoperative urinary retention with need to use a catheter. About half of patients need to go home from surgery with a catheter, which is then later removed in the office. The risk of long-term need for a catheter is very low. There is also a risk of worsening of overactive bladder.   - discussed need for staged procedure to assess postoperative urinary symptoms due to history of urinary retention.   We discussed the symptoms of overactive bladder (OAB), which include urinary urgency, urinary frequency, nocturia, with or without urge incontinence.  While we do not know the exact etiology of OAB, several treatment options exist. We discussed management including behavioral therapy (decreasing bladder irritants, urge suppression strategies, timed voids, bladder retraining), physical therapy, medication; for refractory cases posterior tibial nerve stimulation, sacral neuromodulation, and intravesical botulinum toxin injection.  For anticholinergic medications, we discussed the potential side effects of anticholinergics including dry eyes, dry mouth, constipation, cognitive impairment and urinary retention. For Beta-3 agonist medication, we discussed the potential side effect of elevated blood pressure which is more likely to  occur in individuals with uncontrolled hypertension.  For treatment of stress urinary incontinence,  non-surgical options include expectant management, weight loss, physical therapy, as well as a pessary.  Surgical options include a midurethral sling, Burch urethropexy, and transurethral injection of a bulking agent.   We discussed consent for blood products. Risks for blood transfusion include allergic reactions, other reactions that can affect different body organs and managed accordingly, transmission of infectious diseases such as HIV or Hepatitis. However, the blood is screened. Patient consents for blood products.  Pre-operative instructions:  She was instructed to not take Aspirin/NSAIDs x 7days prior to surgery. She may continue her 81mg  ASA. Antibiotic prophylaxis was ordered as indicated.  Catheter use: Patient will go home with foley for 10-14 days.  Post-operative instructions:  She was provided with specific post-operative instructions, including precautions and signs/symptoms for which we would recommend contacting us, in addition to daytime and after-hours contact phone numbers. This was provided on a handout.   Post-operative medications: Prescriptions for motrin, tylenol, miralax, and oxycodone were previously sent to her pharmacy. Discussed using ibuprofen and tylenol on a schedule to limit use of narcotics.   Laboratory testing:  We will check labs: per anesthesia. Day of surgery UPT not required  Preoperative clearance:  She does not require surgical clearance.    Post-operative follow-up:  A post-operative appointment will be made for 6 weeks from the date of surgery. If she needs a post-operative nurse visit for a voiding trial, that will be set up after she leaves the hospital.    Patient will call the clinic or use MyChart should anything change or any new issues arise.  - pt to start miralax to optimize stool consistency - discussed perioperative anti-coagulation  needed due to PO estradiol use for HRT.  Loleta Chance, MD

## 2023-11-15 NOTE — Progress Notes (Signed)
 [facility] Pre-Operative H&P  Subjective Chief Complaint: Kristina Reed presents for a preoperative encounter.   History of Present Illness: Kristina Reed is a 58 y.o. female who presents for preoperative visit.  She is scheduled to undergo urethral diverticulectomy with cystourethroscopy rescheduled on 11/29/23.  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   Prior difficulty with transportation Most bothered by UUI 2-3x/day, tried trospium (not covered by insurance), Vesicare 5mg  SUI 2x/day, less bothersome Reduced to 1 can of pepsi, 48oz water, 1 cup of coffee/day BM 2-3x/week, hard. Denies starting fiber supplementation or miralax.  Urinary retention 05/31/23 and 08/14/23 evaluate in ED with catheter placement, reports prior intermittent catheterization over the past 8 years. Denies UTI symptoms or sensation of incomplete emptying today.  Past Medical History:  Diagnosis Date   Anxiety    Arthritis    right knee and right elbow   Cancer (HCC)    Cervical CA with partial hysterectomy.   Chest pain    a. 02/2023 Cor CTA: Ca2+ = 0. Nl cors.   Cognitive impairment, mild, so stated    Depression    Diverticulosis    Dyspnea on exertion    a. 02/2023 Echo: EF 60-65%, no rwma, nl RV size/fxn.   Eosinophilic esophagitis 09/03/2017   Biopsy Dec 2018   Esophageal dysphagia    Family history of breast cancer 05/2021   cancer genetic testing letter sent   Gallstones    GERD (gastroesophageal reflux disease)    History of cervical cancer    Hx of cervical cancer 01/30/2016   Hypertension    Hypothyroidism    Palpitations    a. 02/2023 Zio: Predominantly RSR @ 82 (48-162). Rare PACs/PVCs. No sustained arrhythmias/pauses. Triggered events = RSR and sinus tach.   Sleep apnea    does not use a C-PAP   Status post partial hysterectomy    Due to Cervical CA   Vaginal inclusion cyst       Past Surgical History:  Procedure Laterality Date   BREAST BIOPSY Bilateral 01/01/2016   Fibroadenoma   COLONOSCOPY WITH PROPOFOL N/A 06/27/2015   Procedure: COLONOSCOPY WITH PROPOFOL;  Surgeon: Elnita Maxwell, MD;  Location: [facility] ENDOSCOPY;  Service: Endoscopy;  Laterality: N/A;   COLONOSCOPY WITH PROPOFOL N/A 09/23/2017   Procedure: COLONOSCOPY WITH PROPOFOL;  Surgeon: Wyline Mood, MD;  Location: [facility] ENDOSCOPY;  Service: Gastroenterology;  Laterality: N/A;   COLONOSCOPY WITH PROPOFOL N/A 06/09/2021   Procedure: COLONOSCOPY WITH PROPOFOL;  Surgeon: Toney Reil, MD;  Location: [facility] ENDOSCOPY;  Service: Gastroenterology;  Laterality: N/A;   ESOPHAGOGASTRODUODENOSCOPY (EGD) WITH PROPOFOL N/A 07/22/2017   Procedure: ESOPHAGOGASTRODUODENOSCOPY (EGD) WITH PROPOFOL;  Surgeon: Wyline Mood, MD;  Location: [facility] ENDOSCOPY;  Service: Gastroenterology;  Laterality: N/A;   ESOPHAGOGASTRODUODENOSCOPY (EGD) WITH PROPOFOL N/A 03/31/2018   Procedure: ESOPHAGOGASTRODUODENOSCOPY (EGD) WITH PROPOFOL;  Surgeon: Toney Reil, MD;  Location: [facility] ENDOSCOPY;  Service: Gastroenterology;  Laterality: N/A;   ESOPHAGOGASTRODUODENOSCOPY (EGD) WITH PROPOFOL N/A 08/25/2021   Procedure: ESOPHAGOGASTRODUODENOSCOPY (EGD) WITH PROPOFOL;  Surgeon: Toney Reil, MD;  Location: [facility] ENDOSCOPY;  Service: Gastroenterology;  Laterality: N/A;   ESOPHAGOGASTRODUODENOSCOPY (EGD) WITH PROPOFOL N/A 11/20/2022   Procedure: ESOPHAGOGASTRODUODENOSCOPY (EGD) WITH PROPOFOL;  Surgeon: Toney Reil, MD;  Location: [facility] ENDOSCOPY;  Service: Gastroenterology;  Laterality: N/A;   KNEE ARTHROSCOPY Right 07/22/2016   Procedure: ARTHROSCOPY KNEE debridement microfracture;  Surgeon: Erin Sons, MD;  Location:  [facility] ORS;  Service: Orthopedics;  Laterality: Right;   KNEE ARTHROSCOPY WITH MEDIAL MENISECTOMY Left 11/10/2017   Procedure: KNEE ARTHROSCOPY WITH PARTIAL MEDIAL MENISECTOMY&patella femoral debridement, &  ablation;  Surgeon: Erin Sons, MD;  Location: [facility] ORS;  Service: Orthopedics;  Laterality: Left;   TOTAL KNEE ARTHROPLASTY Right 11/24/2021   Procedure: TOTAL KNEE ARTHROPLASTY;  Surgeon: Lyndle Herrlich, MD;  Location: [facility] ORS;  Service: Orthopedics;  Laterality: Right;   TUBAL LIGATION     VAGINAL HYSTERECTOMY     partial hysterectomy    is allergic to prednisone, methylprednisolone, and percocet [oxycodone-acetaminophen].   Family History  Problem Relation Age of Onset   Diabetes Mother    Arrhythmia Mother        Pacemaker   Lung cancer Father 26   Breast cancer Maternal Grandmother 60   Cancer Paternal Grandmother        cancer?   Breast cancer Maternal Aunt        80s   Cirrhosis Cousin    Breast cancer Cousin    Breast cancer Other        x4    Social History   Tobacco Use   Smoking status: Never    Passive exposure: Never   Smokeless tobacco: Never  Vaping Use   Vaping status: Never Used  Substance Use Topics   Alcohol use: Not Currently   Drug use: Never     Review of Systems was negative for a full 10 system review except as noted in the History of Present Illness.   Current Outpatient Medications:    acetaminophen (TYLENOL) 500 MG tablet, Take 1 tablet (500 mg total) by mouth every 6 (six) hours as needed (pain)., Disp: 30 tablet, Rfl: 0   amitriptyline (ELAVIL) 25 MG tablet, TAKE 1 TABLET BY MOUTH AT BEDTIME, Disp: 30 tablet, Rfl: 0   ciprofloxacin-dexamethasone (CIPRODEX) OTIC suspension, Place 4 drops into the left ear 2 (two) times daily., Disp: 7.5 mL, Rfl: 0   estradiol (ESTRACE) 0.5 MG tablet, Take 1 tablet (0.5 mg total) by mouth daily., Disp: 90 tablet, Rfl: 3   fluconazole (DIFLUCAN) 100 MG tablet, Take 2 tablets (200 mg total) by mouth daily., Disp: 28 tablet, Rfl: 0   FLUoxetine (PROZAC) 20 MG capsule, Take 1 capsule (20 mg total) by mouth daily., Disp: 90 capsule, Rfl: 1   ibuprofen (ADVIL) 600 MG tablet, Take 1 tablet (600 mg total)  by mouth every 6 (six) hours as needed., Disp: 30 tablet, Rfl: 0   levothyroxine (SYNTHROID) 75 MCG tablet, Take 1 tablet (75 mcg total) by mouth daily before breakfast., Disp: 90 tablet, Rfl: 1   lisinopril (ZESTRIL) 5 MG tablet, Take 1 tablet by mouth once daily (Patient taking differently: Take 5 mg by mouth daily. Pt not taking), Disp: 90 tablet, Rfl: 1   meloxicam (MOBIC) 15 MG tablet, Take 15 mg by mouth daily., Disp: , Rfl:    omeprazole (PRILOSEC) 40 MG capsule, Take 1 capsule (40 mg total) by mouth daily., Disp: 60 capsule, Rfl: 2   polyethylene glycol powder (GLYCOLAX/MIRALAX) 17 GM/SCOOP powder, Take 17 g by mouth daily. Drink 17g (1 scoop) dissolved in water per day., Disp: 255 g, Rfl: 0   tiZANidine (ZANAFLEX) 4 MG tablet, Take 4 mg by mouth at bedtime. Pt not taking, Disp: , Rfl:    traZODone (DESYREL) 100 MG tablet, Take 100 mg by mouth at bedtime., Disp: , Rfl:    venlafaxine XR (EFFEXOR-XR) 75 MG 24 hr capsule,  Take 1 capsule by mouth daily., Disp: , Rfl:    metoprolol succinate (TOPROL-XL) 25 MG 24 hr tablet, Take 1 tablet (25 mg total) by mouth daily. Take with or immediately following a meal. (Patient taking differently: Take 25 mg by mouth daily. Take with or immediately following a meal.  Pt not taking), Disp: 90 tablet, Rfl: 3   Objective Vitals:   11/15/23 0849  BP: 127/84  Pulse: 70    Gen: NAD CV: S1 S2 RRR Lungs: Clear to auscultation bilaterally Abd: soft, nontender   Previous Pelvic Exam 05/18/23 showed: Fullness palpated along the length of the urethra.   Assessment/ Plan  Assessment: The patient is a 58 y.o. year old scheduled to undergo urethral diverticulectomy with cystourethroscopy. Verbal consent was obtained for these procedures.  Plan: General Surgical Consent: The patient has previously been counseled on alternative treatments, and the decision by the patient and provider was to proceed with the procedure listed above.  For all procedures,  there are risks of bleeding, infection, damage to surrounding organs including but not limited to bowel, bladder, blood vessels, ureters and nerves, and need for further surgery if an injury were to occur. These risks are all low with minimally invasive surgery.   There are risks of numbness and weakness at any body site or buttock/rectal pain.  It is possible that baseline pain can be worsened by surgery, either with or without mesh. If surgery is vaginal, there is also a low risk of possible conversion to laparoscopy or open abdominal incision where indicated. Very rare risks include blood transfusion, blood clot, heart attack, pneumonia, or death.   There is also a risk of short-term postoperative urinary retention with need to use a catheter. About half of patients need to go home from surgery with a catheter, which is then later removed in the office. The risk of long-term need for a catheter is very low. There is also a risk of worsening of overactive bladder.   - discussed need for staged procedure to assess postoperative urinary symptoms due to history of urinary retention.   We discussed the symptoms of overactive bladder (OAB), which include urinary urgency, urinary frequency, nocturia, with or without urge incontinence.  While we do not know the exact etiology of OAB, several treatment options exist. We discussed management including behavioral therapy (decreasing bladder irritants, urge suppression strategies, timed voids, bladder retraining), physical therapy, medication; for refractory cases posterior tibial nerve stimulation, sacral neuromodulation, and intravesical botulinum toxin injection.  For anticholinergic medications, we discussed the potential side effects of anticholinergics including dry eyes, dry mouth, constipation, cognitive impairment and urinary retention. For Beta-3 agonist medication, we discussed the potential side effect of elevated blood pressure which is more likely to  occur in individuals with uncontrolled hypertension.  For treatment of stress urinary incontinence,  non-surgical options include expectant management, weight loss, physical therapy, as well as a pessary.  Surgical options include a midurethral sling, Burch urethropexy, and transurethral injection of a bulking agent.   We discussed consent for blood products. Risks for blood transfusion include allergic reactions, other reactions that can affect different body organs and managed accordingly, transmission of infectious diseases such as HIV or Hepatitis. However, the blood is screened. Patient consents for blood products.  Pre-operative instructions:  She was instructed to not take Aspirin/NSAIDs x 7days prior to surgery. She may continue her 81mg  ASA. Antibiotic prophylaxis was ordered as indicated.  Catheter use: Patient will go home with foley for 10-14 days.  Post-operative instructions:  She was provided with specific post-operative instructions, including precautions and signs/symptoms for which we would recommend contacting us, in addition to daytime and after-hours contact phone numbers. This was provided on a handout.   Post-operative medications: Prescriptions for motrin, tylenol, miralax, and oxycodone were previously sent to her pharmacy. Discussed using ibuprofen and tylenol on a schedule to limit use of narcotics.   Laboratory testing:  We will check labs: per anesthesia. Day of surgery UPT not required  Preoperative clearance:  She does not require surgical clearance.    Post-operative follow-up:  A post-operative appointment will be made for 6 weeks from the date of surgery. If she needs a post-operative nurse visit for a voiding trial, that will be set up after she leaves the hospital.    Patient will call the clinic or use MyChart should anything change or any new issues arise.  - pt to start miralax to optimize stool consistency - discussed perioperative anti-coagulation  needed due to PO estradiol use for HRT.  Loleta Chance, MD

## 2023-11-15 NOTE — Patient Instructions (Addendum)
 Please arrive at [facility] on 11/29/23 for your surgery at 9:45.   We will proceed with a urethral diverticulectomy with cystourethroscopy.   You may need additional treatments for urinary leakage after recovering from your surgery.   For constipation, we reviewed the importance of a better bowel regimen.  We also discussed the importance of avoiding chronic straining, as it can exacerbate her pelvic floor symptoms; we discussed treating constipation and straining prior to surgery, as postoperative straining can lead to damage to the repair and recurrence of symptoms. We discussed initiating therapy with increasing fluid intake, fiber supplementation, stool softeners, and laxatives such as miralax.   Women should try to eat at least 21 to 25 grams of fiber a day, while men should aim for 30 to 38 grams a day. You can add fiber to your diet with food or a fiber supplement such as psyllium (metamucil), benefiber, or fibercon.   Here's a look at how much dietary fiber is found in some common foods. When buying packaged foods, check the Nutrition Facts label for fiber content. It can vary among brands.  Fruits Serving size Total fiber (grams)*  Raspberries 1 cup 8.0  Pear 1 medium 5.5  Apple, with skin 1 medium 4.5  Banana 1 medium 3.0  Orange 1 medium 3.0  Strawberries 1 cup 3.0   Vegetables Serving size Total fiber (grams)*  Green peas, boiled 1 cup 9.0  Broccoli, boiled 1 cup chopped 5.0  Turnip greens, boiled 1 cup 5.0  Brussels sprouts, boiled 1 cup 4.0  Potato, with skin, baked 1 medium 4.0  Sweet corn, boiled 1 cup 3.5  Cauliflower, raw 1 cup chopped 2.0  Carrot, raw 1 medium 1.5   Grains Serving size Total fiber (grams)*  Spaghetti, whole-wheat, cooked 1 cup 6.0  Barley, pearled, cooked 1 cup 6.0  Bran flakes 3/4 cup 5.5  Quinoa, cooked 1 cup 5.0  Oat bran muffin 1 medium 5.0  Oatmeal, instant, cooked 1 cup 5.0  Popcorn, air-popped 3 cups 3.5  Brown rice, cooked 1 cup  3.5  Bread, whole-wheat 1 slice 2.0  Bread, rye 1 slice 2.0   Legumes, nuts and seeds Serving size Total fiber (grams)*  Split peas, boiled 1 cup 16.0  Lentils, boiled 1 cup 15.5  Black beans, boiled 1 cup 15.0  Baked beans, canned 1 cup 10.0  Chia seeds 1 ounce 10.0  Almonds 1 ounce (23 nuts) 3.5  Pistachios 1 ounce (49 nuts) 3.0  Sunflower kernels 1 ounce 3.0  *Rounded to nearest 0.5 gram. Source: [employer] for [employer]	[place]

## 2023-11-18 ENCOUNTER — Ambulatory Visit: Payer: Medicaid Other | Admitting: Gastroenterology

## 2023-11-22 ENCOUNTER — Encounter: Payer: Medicaid Other | Admitting: Obstetrics and Gynecology

## 2023-11-23 ENCOUNTER — Encounter (HOSPITAL_COMMUNITY): Payer: Self-pay | Admitting: Obstetrics and Gynecology

## 2023-11-23 NOTE — Progress Notes (Signed)
 Spoke w/ via phone for pre-op interview--- Kristina Reed Lab needs dos----   BMP per anesthesia.      Lab results------Current EKG in Epic dated 06/29/23. COVID test -----patient states asymptomatic no test needed Arrive at -------0800 NPO after MN NO Solid Food.   Pre-Surgery Ensure or G2:  Med rec completed Medications to take morning of surgery -----Levothyroxine, Prozac and Effexor. Diabetic medication -----  GLP1 agonist last dose: GLP1 instructions:  Patient instructed no nail polish to be worn day of surgery Patient instructed to bring photo id and insurance card day of surgery Patient aware to have Driver (ride ) / caregiver    for 24 hours after surgery - Step daughter Kristina Reed Patient Special Instructions -----Shower with antibacterial soap. Pre-Op special Instructions -----  Patient verbalized understanding of instructions that were given at this phone interview. Patient denies chest pain, sob, fever, cough at the interview.

## 2023-11-29 ENCOUNTER — Ambulatory Visit (HOSPITAL_COMMUNITY)

## 2023-11-29 ENCOUNTER — Other Ambulatory Visit: Payer: Self-pay

## 2023-11-29 ENCOUNTER — Ambulatory Visit (HOSPITAL_BASED_OUTPATIENT_CLINIC_OR_DEPARTMENT_OTHER)

## 2023-11-29 ENCOUNTER — Encounter (HOSPITAL_COMMUNITY): Payer: Self-pay | Admitting: Obstetrics and Gynecology

## 2023-11-29 ENCOUNTER — Ambulatory Visit (HOSPITAL_COMMUNITY)
Admission: RE | Admit: 2023-11-29 | Discharge: 2023-11-29 | Disposition: A | Discharge status: Home / Self Care | Payer: Medicaid Other | Attending: Obstetrics and Gynecology | Admitting: Obstetrics and Gynecology

## 2023-11-29 ENCOUNTER — Encounter (HOSPITAL_COMMUNITY)
Admission: RE | Disposition: A | Discharge status: Home / Self Care | Payer: Self-pay | Source: Home / Self Care | Attending: Obstetrics and Gynecology

## 2023-11-29 DIAGNOSIS — K219 Gastro-esophageal reflux disease without esophagitis: Secondary | ICD-10-CM | POA: Insufficient documentation

## 2023-11-29 DIAGNOSIS — N393 Stress incontinence (female) (male): Secondary | ICD-10-CM | POA: Insufficient documentation

## 2023-11-29 DIAGNOSIS — F418 Other specified anxiety disorders: Secondary | ICD-10-CM | POA: Diagnosis not present

## 2023-11-29 DIAGNOSIS — G4730 Sleep apnea, unspecified: Secondary | ICD-10-CM | POA: Insufficient documentation

## 2023-11-29 DIAGNOSIS — Z8711 Personal history of peptic ulcer disease: Secondary | ICD-10-CM | POA: Insufficient documentation

## 2023-11-29 DIAGNOSIS — Z6832 Body mass index (BMI) 32.0-32.9, adult: Secondary | ICD-10-CM | POA: Diagnosis not present

## 2023-11-29 DIAGNOSIS — E039 Hypothyroidism, unspecified: Secondary | ICD-10-CM | POA: Diagnosis not present

## 2023-11-29 DIAGNOSIS — F419 Anxiety disorder, unspecified: Secondary | ICD-10-CM | POA: Insufficient documentation

## 2023-11-29 DIAGNOSIS — I10 Essential (primary) hypertension: Secondary | ICD-10-CM

## 2023-11-29 DIAGNOSIS — G4733 Obstructive sleep apnea (adult) (pediatric): Secondary | ICD-10-CM | POA: Diagnosis not present

## 2023-11-29 DIAGNOSIS — N361 Urethral diverticulum: Secondary | ICD-10-CM | POA: Insufficient documentation

## 2023-11-29 DIAGNOSIS — N342 Other urethritis: Secondary | ICD-10-CM | POA: Diagnosis not present

## 2023-11-29 DIAGNOSIS — E669 Obesity, unspecified: Secondary | ICD-10-CM | POA: Diagnosis not present

## 2023-11-29 DIAGNOSIS — F32A Depression, unspecified: Secondary | ICD-10-CM | POA: Insufficient documentation

## 2023-11-29 HISTORY — PX: URETHRAL CYST REMOVAL: SHX5128

## 2023-11-29 HISTORY — PX: CYSTOSCOPY: SHX5120

## 2023-11-29 LAB — BASIC METABOLIC PANEL
Anion gap: 10 (ref 5–15)
BUN: 10 mg/dL (ref 6–20)
CO2: 23 mmol/L (ref 22–32)
Calcium: 9 mg/dL (ref 8.9–10.3)
Chloride: 105 mmol/L (ref 98–111)
Creatinine, Ser: 0.89 mg/dL (ref 0.44–1.00)
GFR, Estimated: >60 mL/min (ref >60)
Glucose, Bld: 90 mg/dL (ref 70–99)
Potassium: 4.4 mmol/L (ref 3.5–5.1)
Sodium: 138 mmol/L (ref 135–145)

## 2023-11-29 SURGERY — EXCISION, CYST, PERIURETHRAL
Anesthesia: General | Site: Urethra

## 2023-11-29 MED ORDER — ACETAMINOPHEN 500 MG PO TABS
1000.0000 mg | ORAL_TABLET | ORAL | Status: AC
  Administered 2023-11-29: 1000 mg via ORAL

## 2023-11-29 MED ORDER — LIDOCAINE 2% (20 MG/ML) 5 ML SYRINGE
INTRAMUSCULAR | Status: DC | PRN
  Administered 2023-11-29: 40 mg via INTRAVENOUS
  Administered 2023-11-29: 60 mg via INTRAVENOUS

## 2023-11-29 MED ORDER — FENTANYL CITRATE (PF) 100 MCG/2ML IJ SOLN: 25.0000 ug | INTRAMUSCULAR | Status: DC | PRN

## 2023-11-29 MED ORDER — ROCURONIUM BROMIDE 10 MG/ML (PF) SYRINGE
PREFILLED_SYRINGE | INTRAVENOUS | Status: DC | PRN
  Administered 2023-11-29: 50 mg via INTRAVENOUS
  Administered 2023-11-29: 10 mg via INTRAVENOUS

## 2023-11-29 MED ORDER — OXYCODONE HCL 5 MG/5ML PO SOLN: 5.0000 mg | Freq: Once | ORAL | Status: DC | PRN

## 2023-11-29 MED ORDER — PHENAZOPYRIDINE HCL 100 MG PO TABS
ORAL_TABLET | ORAL | Status: AC
  Filled 2023-11-29: qty 2

## 2023-11-29 MED ORDER — ONDANSETRON HCL 4 MG/2ML IJ SOLN
INTRAMUSCULAR | Status: AC
  Filled 2023-11-29: qty 2

## 2023-11-29 MED ORDER — MIDAZOLAM HCL 2 MG/2ML IJ SOLN
INTRAMUSCULAR | Status: AC
  Filled 2023-11-29: qty 2

## 2023-11-29 MED ORDER — PHENYLEPHRINE 80 MCG/ML (10ML) SYRINGE FOR IV PUSH (FOR BLOOD PRESSURE SUPPORT)
PREFILLED_SYRINGE | INTRAVENOUS | Status: DC | PRN
  Administered 2023-11-29 (×8): 80 ug via INTRAVENOUS

## 2023-11-29 MED ORDER — METHYLENE BLUE (ANTIDOTE) 1 % IV SOLN
INTRAVENOUS | Status: DC | PRN
  Administered 2023-11-29: 10 mL

## 2023-11-29 MED ORDER — SODIUM CHLORIDE 0.9 % IV SOLN: INTRAVENOUS | Status: DC

## 2023-11-29 MED ORDER — METHYLENE BLUE (ANTIDOTE) 1 % IV SOLN
INTRAVENOUS | Status: AC
  Filled 2023-11-29: qty 10

## 2023-11-29 MED ORDER — FENTANYL CITRATE (PF) 250 MCG/5ML IJ SOLN
INTRAMUSCULAR | Status: DC | PRN
  Administered 2023-11-29 (×2): 50 ug via INTRAVENOUS

## 2023-11-29 MED ORDER — EPHEDRINE 5 MG/ML INJ
INTRAVENOUS | Status: AC
  Filled 2023-11-29: qty 5

## 2023-11-29 MED ORDER — ONDANSETRON HCL 4 MG/2ML IJ SOLN
INTRAMUSCULAR | Status: DC | PRN
  Administered 2023-11-29: 4 mg via INTRAVENOUS

## 2023-11-29 MED ORDER — PROPOFOL 10 MG/ML IV BOLUS
INTRAVENOUS | Status: DC | PRN
  Administered 2023-11-29: 150 mg via INTRAVENOUS

## 2023-11-29 MED ORDER — GABAPENTIN 300 MG PO CAPS
ORAL_CAPSULE | ORAL | Status: AC
  Filled 2023-11-29: qty 1

## 2023-11-29 MED ORDER — CEFAZOLIN SODIUM-DEXTROSE 2-4 GM/100ML-% IV SOLN
2.0000 g | INTRAVENOUS | Status: AC
  Administered 2023-11-29: 2 g via INTRAVENOUS
  Filled 2023-11-29: qty 100

## 2023-11-29 MED ORDER — PHENAZOPYRIDINE HCL 100 MG PO TABS
200.0000 mg | ORAL_TABLET | ORAL | Status: AC
  Administered 2023-11-29: 200 mg via ORAL

## 2023-11-29 MED ORDER — FENTANYL CITRATE (PF) 250 MCG/5ML IJ SOLN
INTRAMUSCULAR | Status: AC
  Filled 2023-11-29: qty 5

## 2023-11-29 MED ORDER — SUGAMMADEX SODIUM 200 MG/2ML IV SOLN
INTRAVENOUS | Status: AC
  Filled 2023-11-29: qty 2

## 2023-11-29 MED ORDER — SODIUM CHLORIDE (PF) 0.9 % IJ SOLN
INTRAMUSCULAR | Status: AC
  Filled 2023-11-29: qty 10

## 2023-11-29 MED ORDER — GABAPENTIN 300 MG PO CAPS
300.0000 mg | ORAL_CAPSULE | ORAL | Status: AC
  Administered 2023-11-29: 300 mg via ORAL

## 2023-11-29 MED ORDER — SUGAMMADEX SODIUM 200 MG/2ML IV SOLN
INTRAVENOUS | Status: DC | PRN
  Administered 2023-11-29: 200 mg via INTRAVENOUS

## 2023-11-29 MED ORDER — ACETAMINOPHEN 10 MG/ML IV SOLN: 1000.0000 mg | Freq: Once | INTRAVENOUS | Status: DC | PRN

## 2023-11-29 MED ORDER — PHENYLEPHRINE 80 MCG/ML (10ML) SYRINGE FOR IV PUSH (FOR BLOOD PRESSURE SUPPORT)
PREFILLED_SYRINGE | INTRAVENOUS | Status: AC
  Filled 2023-11-29: qty 10

## 2023-11-29 MED ORDER — LIDOCAINE 2% (20 MG/ML) 5 ML SYRINGE
INTRAMUSCULAR | Status: AC
Start: 2023-11-29 — End: ?
  Filled 2023-11-29: qty 5

## 2023-11-29 MED ORDER — MIDAZOLAM HCL 2 MG/2ML IJ SOLN
INTRAMUSCULAR | Status: DC | PRN
  Administered 2023-11-29: 2 mg via INTRAVENOUS

## 2023-11-29 MED ORDER — SODIUM CHLORIDE 0.9 % IR SOLN
Status: DC | PRN
  Administered 2023-11-29: 1000 mL via INTRAVESICAL

## 2023-11-29 MED ORDER — ROCURONIUM BROMIDE 10 MG/ML (PF) SYRINGE
PREFILLED_SYRINGE | INTRAVENOUS | Status: AC
  Filled 2023-11-29: qty 10

## 2023-11-29 MED ORDER — CHLORHEXIDINE GLUCONATE 0.12 % MT SOLN
OROMUCOSAL | Status: AC
  Administered 2023-11-29: 15 mL
  Filled 2023-11-29: qty 15

## 2023-11-29 MED ORDER — EPHEDRINE SULFATE-NACL 50-0.9 MG/10ML-% IV SOSY
PREFILLED_SYRINGE | INTRAVENOUS | Status: DC | PRN
Start: 2023-11-29 — End: 2023-11-29
  Administered 2023-11-29 (×2): 5 mg via INTRAVENOUS

## 2023-11-29 MED ORDER — LIDOCAINE-EPINEPHRINE 1 %-1:100000 IJ SOLN
INTRAMUSCULAR | Status: DC | PRN
  Administered 2023-11-29: 20 mL

## 2023-11-29 MED ORDER — STERILE WATER FOR IRRIGATION IR SOLN
Status: DC | PRN
  Administered 2023-11-29: 1000 mL

## 2023-11-29 MED ORDER — ACETAMINOPHEN 500 MG PO TABS
ORAL_TABLET | ORAL | Status: AC
  Filled 2023-11-29: qty 2

## 2023-11-29 MED ORDER — LIDOCAINE-EPINEPHRINE 1 %-1:100000 IJ SOLN
INTRAMUSCULAR | Status: AC
  Filled 2023-11-29: qty 1

## 2023-11-29 MED ORDER — DROPERIDOL 2.5 MG/ML IJ SOLN: 0.6250 mg | Freq: Once | INTRAMUSCULAR | Status: DC | PRN

## 2023-11-29 MED ORDER — SCOPOLAMINE 1 MG/3DAYS TD PT72
1.0000 | MEDICATED_PATCH | TRANSDERMAL | Status: DC
  Administered 2023-11-29: 1.5 mg via TRANSDERMAL

## 2023-11-29 MED ORDER — OXYCODONE HCL 5 MG PO TABS: 5.0000 mg | ORAL_TABLET | Freq: Once | ORAL | Status: DC | PRN

## 2023-11-29 MED ORDER — PROPOFOL 10 MG/ML IV BOLUS
INTRAVENOUS | Status: AC
  Filled 2023-11-29: qty 20

## 2023-11-29 MED ORDER — SCOPOLAMINE 1 MG/3DAYS TD PT72
MEDICATED_PATCH | TRANSDERMAL | Status: DC
Start: 2023-11-29 — End: 2023-11-29
  Filled 2023-11-29: qty 1

## 2023-11-29 SURGICAL SUPPLY — 51 items
BAG COUNTER SPONGE SURGICOUNT (BAG) ×2 IMPLANT
BLADE CLIPPER SENSICLIP SURGIC (BLADE) ×2 IMPLANT
BLADE SURG 15 STRL LF DISP TIS (BLADE) ×3 IMPLANT
CANISTER SUCT 3000ML PPV (MISCELLANEOUS) ×2 IMPLANT
DERMABOND ADVANCED .7 DNX12 (GAUZE/BANDAGES/DRESSINGS) ×2 IMPLANT
DEVICE CAPIO SLIM SINGLE (INSTRUMENTS) IMPLANT
ELECT REM PT RETURN 9FT ADLT (ELECTROSURGICAL) ×3 IMPLANT
ELECTRODE REM PT RTRN 9FT ADLT (ELECTROSURGICAL) IMPLANT
GAUZE 4X4 16PLY ~~LOC~~+RFID DBL (SPONGE) ×3 IMPLANT
GAUZE SPONGE 4X4 12PLY STRL LF (GAUZE/BANDAGES/DRESSINGS) ×1 IMPLANT
GLOVE BIO SURGEON STRL SZ 6 (GLOVE) ×3 IMPLANT
GLOVE BIOGEL PI IND STRL 6.5 (GLOVE) ×3 IMPLANT
GLOVE ECLIPSE 6.0 STRL STRAW (GLOVE) ×3 IMPLANT
GLOVE SURG UNDER POLY LF SZ7 (GLOVE) ×3 IMPLANT
GOWN STRL REUS W/ TWL LRG LVL3 (GOWN DISPOSABLE) ×12 IMPLANT
GOWN STRL REUS W/TWL LRG LVL3 (GOWN DISPOSABLE) ×3 IMPLANT
HIBICLENS CHG 4% 4OZ BTL (MISCELLANEOUS) ×3 IMPLANT
HOLDER FOLEY CATH W/STRAP (MISCELLANEOUS) ×3 IMPLANT
IV NS 1000ML BAXH (IV SOLUTION) ×2 IMPLANT
KIT TURNOVER KIT B (KITS) ×3 IMPLANT
MANIFOLD NEPTUNE II (INSTRUMENTS) ×3 IMPLANT
NDL HYPO 22X1.5 SAFETY MO (MISCELLANEOUS) ×2 IMPLANT
NEEDLE HYPO 22X1.5 SAFETY MO (MISCELLANEOUS) ×3 IMPLANT
NS IRRIG 1000ML POUR BTL (IV SOLUTION) ×2 IMPLANT
PACK CYSTO (CUSTOM PROCEDURE TRAY) ×2 IMPLANT
PACK VAGINAL WOMENS (CUSTOM PROCEDURE TRAY) ×3 IMPLANT
PAD OB MATERNITY 11 LF (PERSONAL CARE ITEMS) ×3 IMPLANT
RETRACTOR LONE STAR DISPOSABLE (INSTRUMENTS) ×3 IMPLANT
RETRACTOR STAY HOOK 5MM (MISCELLANEOUS) ×3 IMPLANT
SET CYSTO W/LG BORE CLAMP LF (SET/KITS/TRAYS/PACK) IMPLANT
SET IRRIG Y TYPE TUR BLADDER L (SET/KITS/TRAYS/PACK) ×3 IMPLANT
SLEEVE SCD COMPRESS KNEE MED (STOCKING) ×3 IMPLANT
SPIKE FLUID TRANSFER (MISCELLANEOUS) ×3 IMPLANT
SUCTION TUBE FRAZIER 10FR DISP (SUCTIONS) ×3 IMPLANT
SURGIFLO W/THROMBIN 8M KIT (HEMOSTASIS) IMPLANT
SUT ABS MONO DBL WITH NDL 48IN (SUTURE) IMPLANT
SUT VIC AB 0 CT1 18XCR BRD8 (SUTURE) IMPLANT
SUT VIC AB 0 CT1 27XBRD ANBCTR (SUTURE) IMPLANT
SUT VIC AB 0 CT1 27XBRD ANTBC (SUTURE) IMPLANT
SUT VIC AB 0 CT1 27XCR 8 STRN (SUTURE) IMPLANT
SUT VIC AB 2-0 SH 27XBRD (SUTURE) ×3 IMPLANT
SUT VIC AB 3-0 SH 18 (SUTURE) ×1 IMPLANT
SUT VICRYL 2-0 SH 8X27 (SUTURE) ×1 IMPLANT
SYR 10ML LL (SYRINGE) ×3 IMPLANT
SYR BULB EAR ULCER 3OZ GRN STR (SYRINGE) ×3 IMPLANT
SYR TOOMEY IRRIG 70ML (MISCELLANEOUS) ×3 IMPLANT
SYRINGE TOOMEY IRRIG 70ML (MISCELLANEOUS) IMPLANT
TOWEL GREEN STERILE FF (TOWEL DISPOSABLE) ×5 IMPLANT
TRAY FOLEY W/BAG SLVR 14FR (SET/KITS/TRAYS/PACK) ×3 IMPLANT
UNDERPAD 30X36 HEAVY ABSORB (UNDERPADS AND DIAPERS) ×3 IMPLANT
WATER STERILE IRR 1000ML POUR (IV SOLUTION) ×1 IMPLANT

## 2023-11-29 NOTE — Op Note (Signed)
 Operative Note  Preoperative Diagnosis:  urethral diverticulum  Postoperative Diagnosis: same  Procedures performed:  Urethral diverticulectomy, cystoscopy  Implants: none  Attending Surgeon: Lanetta Inch, MD  Assistant Surgeon: Jay Schlichter, MD  Anesthesia: General endotracheal  Findings: Diverticulum present in mid-proximal urethra with ostia on the right lateral portion of the urethra. Diverticulum tracked behind the pubic bone on the right.   Specimens:  ID Type Source Tests Collected by Time Destination  1 : urethral diverticulum Tissue Path Tissue SURGICAL PATHOLOGY Marguerita Beards, MD 11/29/2023 1253     Estimated blood loss: 100 mL  IV fluids: 1000 mL  Urine output: 300 mL  Complications: none  Procedure in Detail:  After informed consent was obtained the patient was taken to the operating room where general anesthesia was induced and found to be adequate.  She was placed in dorsolithotomy position taking care to avoid any nerve injury and prepped and draped in the usual sterile fashion.  Foley catheter was placed and a [place] retractor was placed.  A large suburethral cyst was noted.   An inverted U-shaped incision was marked with a marking pen in the suburethral space and the vaginal wall was injected with 1% lidocaine with epinephrine. Incision was made with a 15 blade scalpel. Dissection was performed with Metzenbaum scissors to gently dissect the epithelium off of the vesicovaginal fascia.  The vesicovaginal fascia was then incised vertically with a scalpel.  Dissection was then performed underneath the fascia to free the cyst wall.  Allis clamps were used to place tension on the cyst and it was dissected carefully along the left side of the urethra. The diverticulum was entered and fluid was expelled. Dissection of the cyst wall was performed to the left side. A small entry was made into the urethra and this was closed with a 3-0 vicryl suture in two layers.  Cystourethroscopy was performed with a 70 degree cystoscope. A 360 degree view of the bladder was obtained. The mucosa appeared normal and there was brisk bilateral ureteral efflux. The cystoscope was slowly removed to inspect the urethra. No ostia was identified. The foley catheter was replaced. The diverticular sac was noted to track more anteriorly on the right side. An incision was made with the bovie around the urethra with a finger deviating the urethra to the opposing side laterally. The incision allowed access to the lateral diverticulum and urethra. A finger was placed into the diverticulum and it was noted to extend behind the pubic bone then superior to the urethra. The foley catheter balloon was deflated and the catheter was pulled into the urethra. Metheylene blue with saline was injected with a syringe to identify the ostia. The ostia was identified on the mid right lateral portion of the urethra. A lacrimal duct probe was placed through it and out the urethra.  This was closed with a 3-0 vicryl interrupted sutures. A second layer was used to imbricate the first. Methylene blue was injected into the urethra and confirmed water tight closure of both sites of urethral repair. Foley catheter was then placed into the bladder for drainage.   Fascial tissue was closed over the urethra with two interrupted 2-0 vicryl sutures.  The dead space on the right was closed with 2-0 vicryl sutures. The incision around the right of the urethra was closed with 2-0 vicryl suture in several layers.  The vaginal epithelium was trimmed then dead space with closed with 2-0 vicryl interrupted sutures. The incision was then closed with  a 2-0 Vicryl suture in a running fashion.    Irrigation was performed and good hemostasis was again noted.  The patient tolerated the procedure well and she was awoken and taken to the recovery room in stable condition.  Needle and sponge count was correct x 2.    Marguerita Beards,  MD

## 2023-11-29 NOTE — Anesthesia Procedure Notes (Signed)
 Procedure Name: Intubation Date/Time: 11/29/2023 10:15 AM  Performed by: Corbyn Wildey C, CRNAPre-anesthesia Checklist: Patient identified, Emergency Drugs available, Suction available and Patient being monitored Patient Re-evaluated:Patient Re-evaluated prior to induction Oxygen Delivery Method: Circle system utilized Preoxygenation: Pre-oxygenation with 100% oxygen Induction Type: IV induction Ventilation: Mask ventilation without difficulty Laryngoscope Size: Mac and 3 Grade View: Grade I Tube type: Oral Tube size: 7.0 mm Number of attempts: 1 Airway Equipment and Method: Stylet and Oral airway Placement Confirmation: ETT inserted through vocal cords under direct vision, positive ETCO2 and breath sounds checked- equal and bilateral Tube secured with: Tape Dental Injury: Teeth and Oropharynx as per pre-operative assessment

## 2023-11-29 NOTE — Transfer of Care (Signed)
 Immediate Anesthesia Transfer of Care Note  Patient: Kristina Reed  Procedure(s) Performed: URETHRAL DIVERTICULECTOMY (Urethra) CYSTOSCOPY (Bladder)  Patient Location: PACU  Anesthesia Type:General  Level of Consciousness: awake, alert , and oriented  Airway & Oxygen Therapy: Patient Spontanous Breathing and Patient connected to face mask oxygen  Post-op Assessment: Report given to RN and Post -op Vital signs reviewed and stable  Post vital signs: Reviewed and stable  Last Vitals:  Vitals Value Taken Time  BP    Temp    Pulse 78 11/29/23 1345  Resp 11 11/29/23 1345  SpO2 100 % 11/29/23 1345  Vitals shown include unfiled device data.  Last Pain:  Vitals:   11/29/23 0839  TempSrc: Oral  PainSc: 0-No pain      Patients Stated Pain Goal: 2 (11/29/23 0839)  Complications: No notable events documented.

## 2023-11-29 NOTE — Anesthesia Postprocedure Evaluation (Signed)
 Anesthesia Post Note  Patient: Kristina Reed  Procedure(s) Performed: URETHRAL DIVERTICULECTOMY (Urethra) CYSTOSCOPY (Bladder)     Patient location during evaluation: PACU Anesthesia Type: General Level of consciousness: awake and alert Pain management: pain level controlled Vital Signs Assessment: post-procedure vital signs reviewed and stable Respiratory status: spontaneous breathing, nonlabored ventilation, respiratory function stable and patient connected to nasal cannula oxygen Cardiovascular status: blood pressure returned to baseline and stable Postop Assessment: no apparent nausea or vomiting Anesthetic complications: no   No notable events documented.  Last Vitals:  Vitals:   11/29/23 0839 11/29/23 1344  BP: (!) 125/91   Pulse: 84   Resp: 16   Temp: 36.6 C (!) 36.4 C  SpO2: 97%     Last Pain:  Vitals:   11/29/23 1344  TempSrc:   PainSc: 0-No pain                 [place]

## 2023-11-29 NOTE — Discharge Instructions (Addendum)
 POST OPERATIVE INSTRUCTIONS  General Instructions Recovery (not bed rest) will last approximately 6 weeks Walking is encouraged, but refrain from strenuous exercise/ housework/ heavy lifting. No lifting >10lbs  Nothing in the vagina- NO intercourse, tampons or douching Bathing:  Do not submerge in water (NO swimming, bath, hot tub, etc) until after your postop visit. You can shower starting the day after surgery.  No driving until you are not taking narcotic pain medicine and until your pain is well enough controlled that you can slam on the breaks or make sudden movements if needed.   Taking your medications Please take your acetaminophen and ibuprofen on a schedule for the first 48 hours. Take 600mg  ibuprofen, then take 500mg  acetaminophen 3 hours later, then continue to alternate ibuprofen and acetaminophen. That way you are taking each type of medication every 6 hours. Take the prescribed narcotic (oxycodone, tramadol, etc) as needed, with a maximum being every 4 hours.  Take a stool softener daily to keep your stools soft and preventing you from straining. If you have diarrhea, you decrease your stool softener. This is explained more below. We have prescribed you Miralax.  Reasons to Call the Nurse (see last page for phone numbers) Heavy Bleeding (changing your pad every 1-2 hours) Persistent nausea/vomiting Fever (100.4 degrees or more) Incision problems (pus or other fluid coming out, redness, warmth, increased pain)  Things to Expect After Surgery Mild to Moderate pain is normal during the first day or two after surgery. If prescribed, take Ibuprofen or Tylenol first and use the stronger medicine for "break-through" pain. You can overlap these medicines because they work differently.   Constipation   To Prevent Constipation:  Eat a well-balanced diet including protein, grains, fresh fruit and vegetables.  Drink plenty of fluids. Walk regularly.  Depending on specific instructions  from your physician: take Miralax daily and additionally you can add a stool softener (colace/ docusate) and fiber supplement. Continue as long as you're on pain medications.   To Treat Constipation:  If you do not have a bowel movement in 2 days after surgery, you can take 2 Tbs of Milk of Magnesia 1-2 times a day until you have a bowel movement. If diarrhea occurs, decrease the amount or stop the laxative. If no results with Milk of Magnesia, you can drink a bottle of magnesium citrate which you can purchase over the counter.  Fatigue:  This is a normal response to surgery and will improve with time.  Plan frequent rest periods throughout the day.  Gas Pain:  This is very common but can also be very painful! Drink warm liquids such as herbal teas, bouillon or soup. Walking will help you pass more gas.  Mylicon or Gas-X can be taken over the counter.  Leaking Urine:  Varying amounts of leakage may occur after surgery.  This should improve with time. Your bladder needs at least 3 months to recover from surgery. If you leak after surgery, be sure to mention this to your doctor at your post-op visit. If you were taking medications for overactive bladder prior to surgery, be sure to restart the medications immediately after surgery.  Incisions: If you have incisions on your abdomen, the skin glue will dissolve on its own over time. It is ok to gently rinse with soap and water over these incisions but do not scrub.  Catheter You will keep the catheter for two weeks. Our office will call you to set an appointment to have the catheter removed.  Return to Work  As work demands and recovery times vary widely, it is hard to predict when you will want to return to work. If you have a desk job with no strenuous physical activity, and if you would like to return sooner than generally recommended, discuss this with your provider or call our office.   Post op concerns  For non-emergent issues, please call  the Urogynecology Nurse. Please leave a message and someone will contact you within one business day.  You can also send a message through MyChart.   AFTER HOURS (After 5:00 PM and on weekends):  For urgent matters that cannot wait until the next business day. Call our office [phone] and connect to the doctor on call.  Please reserve this for important issues.   **FOR ANY TRUE EMERGENCY ISSUES CALL 911 OR GO TO THE NEAREST EMERGENCY ROOM.** Please inform our office or the doctor on call of any emergency.     APPOINTMENTS: Call [phone]  Post Anesthesia Home Care Instructions  Activity: Get plenty of rest for the remainder of the day. A responsible individual must stay with you for 24 hours following the procedure.  For the next 24 hours, DO NOT: -Drive a car -Advertising copywriter -Drink alcoholic beverages -Take any medication unless instructed by your physician -Make any legal decisions or sign important papers.  Meals: Start with liquid foods such as gelatin or soup. Progress to regular foods as tolerated. Avoid greasy, spicy, heavy foods. If nausea and/or vomiting occur, drink only clear liquids until the nausea and/or vomiting subsides. Call your physician if vomiting continues.  Special Instructions/Symptoms: Your throat may feel dry or sore from the anesthesia or the breathing tube placed in your throat during surgery. If this causes discomfort, gargle with warm salt water. The discomfort should disappear within 24 hours.  If you had a scopolamine patch placed behind your ear for the management of post- operative nausea and/or vomiting:  1. The medication in the patch is effective for 72 hours, after which it should be removed.  Wrap patch in a tissue and discard in the trash. Wash hands thoroughly with soap and water. 2. You may remove the patch earlier than 72 hours if you experience unpleasant side effects which may include dry mouth, dizziness or visual  disturbances. 3. Avoid touching the patch. Wash your hands with soap and water after contact with the patch.

## 2023-11-29 NOTE — Interval H&P Note (Signed)
 History and Physical Interval Note:  11/29/2023 9:26 AM  Kristina Reed  has presented today for surgery, with the diagnosis of urethral diverticulum; stress urinary incontinence.  The various methods of treatment have been discussed with the patient and family. After consideration of risks, benefits and other options for treatment, the patient has consented to  Procedure(s) with comments: URETHRAL DIVERTICULECTOMY (N/A) - [facility] CYSTOSCOPY (N/A)  as a surgical intervention.  The patient's history has been reviewed, patient examined, no change in status, stable for surgery.  I have reviewed the patient's chart and labs.  Questions were answered to the patient's satisfaction.     Marguerita Beards

## 2023-11-29 NOTE — Anesthesia Preprocedure Evaluation (Addendum)
 Anesthesia Evaluation  Patient identified by MRN, date of birth, ID band Patient awake    Reviewed: Allergy & Precautions, NPO status , Patient's Chart, lab work & pertinent test results  History of Anesthesia Complications Negative for: history of anesthetic complications  Airway Mallampati: I   Neck ROM: Full    Dental  (+) Edentulous Upper, Edentulous Lower   Pulmonary sleep apnea , neg COPD, Patient abstained from smoking.Not current smoker   Pulmonary exam normal breath sounds clear to auscultation       Cardiovascular Exercise Tolerance: Good METShypertension, (-) CAD and (-) Past MI Normal cardiovascular exam(-) dysrhythmias  Rhythm:Regular Rate:Normal  ECG 10/17/21: normal   Neuro/Psych  Headaches PSYCHIATRIC DISORDERS Anxiety Depression       GI/Hepatic PUD,GERD  Medicated and Controlled,,(+)     (-) substance abuse    Endo/Other  neg diabetesHypothyroidism  Obesity   Renal/GU negative Renal ROS     Musculoskeletal  (+) Arthritis ,    Abdominal   Peds  Hematology negative hematology ROS (+)   Anesthesia Other Findings urethral diverticulum; stress urinary incontinence  Reproductive/Obstetrics Cervical CA                             Anesthesia Physical Anesthesia Plan  ASA: 3  Anesthesia Plan: General   Post-op Pain Management: Tylenol PO (pre-op)*   Induction: Intravenous  PONV Risk Score and Plan: 3 and Ondansetron, Dexamethasone, Treatment may vary due to age or medical condition and Scopolamine patch - Pre-op  Airway Management Planned: LMA  Additional Equipment: None  Intra-op Plan:   Post-operative Plan: Extubation in OR  Informed Consent: I have reviewed the patients History and Physical, chart, labs and discussed the procedure including the risks, benefits and alternatives for the proposed anesthesia with the patient or authorized representative who has  indicated his/her understanding and acceptance.     Dental advisory given  Plan Discussed with: CRNA and Surgeon  Anesthesia Plan Comments: (Discussed risks of anesthesia with patient, including possibility of difficulty with spontaneous ventilation under anesthesia necessitating airway intervention, PONV, and rare risks such as cardiac or respiratory or neurological events, and allergic reactions. Discussed the role of CRNA in patient's perioperative care. Patient understands.)        Anesthesia Quick Evaluation

## 2023-11-30 ENCOUNTER — Encounter (HOSPITAL_COMMUNITY): Payer: Self-pay | Admitting: Obstetrics and Gynecology

## 2023-11-30 ENCOUNTER — Encounter: Payer: Medicaid Other | Admitting: Obstetrics and Gynecology

## 2023-11-30 ENCOUNTER — Telehealth: Payer: Self-pay | Admitting: Obstetrics and Gynecology

## 2023-11-30 LAB — SURGICAL PATHOLOGY

## 2023-11-30 NOTE — Telephone Encounter (Signed)
 Kristina Reed underwent urethral diverticulectomy and cystoscopy on 11/29/23.   Catheter was kept in place due to urethral repair.   Please call her for a routine post op check and to schedule a voiding trial by March 31st. Thanks!  Marguerita Beards, MD

## 2023-12-01 NOTE — Telephone Encounter (Signed)
 Called and spoke to patient: Patient reports pain is a 10/10, She reports she is taking the Tylenol and ibuprofen and is also taking her narcotic as needed. Last dose within the last 10 minutes of phone conversation.  Patient reports she has had a bowel movement and is taking miralax.  Patient reports she has seen bleeding. Reports she has seen about a half pad of blood today.  Denies issues with the catheter. Reports is has been irritating but she "Figured it out".  Encouraged patient to call if pain is not improving or if she needs anything. Patient told me she would call.

## 2023-12-02 NOTE — Telephone Encounter (Signed)
 Pt called. She states her pain is well controlled. It hurts with sitting but she can sit for several hours without difficulty. She is taking tylenol as needed. Her catheter is draining well. She will call with any further concerns. Has appt for voiding trial on 3/31.   Marguerita Beards, MD

## 2023-12-10 ENCOUNTER — Telehealth: Payer: Self-pay | Admitting: Obstetrics

## 2023-12-10 DIAGNOSIS — T83038A Leakage of other indwelling urethral catheter, initial encounter: Secondary | ICD-10-CM

## 2023-12-10 MED ORDER — PHENAZOPYRIDINE HCL 200 MG PO TABS: 200.0000 mg | ORAL_TABLET | Freq: Three times a day (TID) | ORAL | 0 refills | Status: DC | PRN

## 2023-12-10 MED ORDER — NITROFURANTOIN MONOHYD MACRO 100 MG PO CAPS: 100.0000 mg | ORAL_CAPSULE | Freq: Two times a day (BID) | ORAL | 0 refills | Status: AC

## 2023-12-10 NOTE — Telephone Encounter (Signed)
 Patient was called with recommendations from Dr Olena Leatherwood.  She denies any abdominal pain, back pain, nausea/vomiting , hematuria or fever.  She describes her urine as dark yellow. She complaints of urinary leaking, soaking 2 pads a day. She will pick up the antibiotics and start tomorrow as she can't get to the pharmacy today. She agrees to go to the ER if any new symptoms occur.

## 2023-12-10 NOTE — Telephone Encounter (Signed)
 Pt called in c/o leaking urine and having to change her clothes a couple times during the day.  She is eating well.  No N&V. No pain or fever.  [facility] [address] [place] is her pharmacy.

## 2023-12-10 NOTE — Telephone Encounter (Signed)
 Patient called regarding leakage around foley catheter, reports normal drainage into foley bag.  Unable to present to office for evaluation due to lack of transportation.  Denies hematuria, pain, fever, N/V, or back pain.  Rx macrobid for presumed UTI and pyridium.  Pending void trial Monday for foley removal.  Consider anticholinergic for bladder spasm if needed.

## 2023-12-13 ENCOUNTER — Ambulatory Visit

## 2023-12-13 NOTE — Patient Instructions (Signed)
 Please keep all scheduled follow ups.  It was a pleasure to see you today!  Thank you for trusting me with your care!

## 2023-12-13 NOTE — Progress Notes (Signed)
 Kristina Reed is a 58 y.o. female is here for a voiding trial.  Catheter was removed with no complications.  Instilled: 75ml  Voided: 25ml measured as she urinated on the table with catheter being removed.   PVR: 82  Per Job Founds NP. Have patient sit in office and drink fluids, see if she urinates on her own.  After having the patient drink fluids and sit for a while patient urinated 30 ml and had an PVR of 8.

## 2023-12-25 DIAGNOSIS — Z419 Encounter for procedure for purposes other than remedying health state, unspecified: Secondary | ICD-10-CM | POA: Diagnosis not present

## 2024-01-03 DIAGNOSIS — Z7689 Persons encountering health services in other specified circumstances: Secondary | ICD-10-CM | POA: Diagnosis not present

## 2024-01-03 DIAGNOSIS — R29818 Other symptoms and signs involving the nervous system: Secondary | ICD-10-CM | POA: Diagnosis not present

## 2024-01-03 DIAGNOSIS — Z818 Family history of other mental and behavioral disorders: Secondary | ICD-10-CM | POA: Diagnosis not present

## 2024-01-03 DIAGNOSIS — R413 Other amnesia: Secondary | ICD-10-CM | POA: Diagnosis not present

## 2024-01-07 DIAGNOSIS — Z7689 Persons encountering health services in other specified circumstances: Secondary | ICD-10-CM | POA: Diagnosis not present

## 2024-01-07 DIAGNOSIS — M79675 Pain in left toe(s): Secondary | ICD-10-CM | POA: Diagnosis not present

## 2024-01-07 DIAGNOSIS — M79674 Pain in right toe(s): Secondary | ICD-10-CM | POA: Diagnosis not present

## 2024-01-07 DIAGNOSIS — B351 Tinea unguium: Secondary | ICD-10-CM | POA: Diagnosis not present

## 2024-01-10 ENCOUNTER — Encounter: Payer: Self-pay | Admitting: Obstetrics and Gynecology

## 2024-01-10 ENCOUNTER — Ambulatory Visit (INDEPENDENT_AMBULATORY_CARE_PROVIDER_SITE_OTHER): Admitting: Obstetrics and Gynecology

## 2024-01-10 VITALS — BP 117/83 | HR 76

## 2024-01-10 DIAGNOSIS — N3281 Overactive bladder: Secondary | ICD-10-CM

## 2024-01-10 MED ORDER — MIRABEGRON ER 25 MG PO TB24: 25.0000 mg | ORAL_TABLET | Freq: Every day | ORAL | 5 refills | Status: DC

## 2024-01-10 NOTE — Progress Notes (Signed)
 [facility]  Date of Visit: 01/10/2024  History of Present Illness: Kristina Reed is a 58 y.o. female scheduled today for a post-operative visit.   Surgery: s/p Urethral diverticulectomy, cystoscopy on 11/29/23  She passed her postoperative void trial on 12/13/23.   Postoperative course has been uncomplicated.   Today she reports she has some urge incontinence on the way to the bathroom. Only urinates 2-3 times per day. Wears liners and goes through 1-2 per day. Has small drops of urine, not big drops. She wakes 2-3 times at night to urinate. Two times she did wet the bed. This was shortly after the surgery. Denies leakage with cough and sneeze.   During the day, she drinks only water . Has stopped drinking soda and caffeine.   UTI in the last 6 weeks? No  Pain? No  She has returned to her normal activity (except for postop restrictions) Vaginal bulge? No  Stress incontinence: No  Urgency/frequency: No  Urge incontinence: Yes  Voiding dysfunction: No  Bowel issues: No    Pathology results: URETHRAL DIVERTICULUM, RESECTION:  - Benign nonkeratinizing squamous epithelium and submucosa with chronic  inflammation  - Negative for malignancy   Medications: She has a current medication list which includes the following prescription(s): estradiol , fluoxetine , ibuprofen , levothyroxine , lisinopril , mirabegron er, multivitamin with minerals, omeprazole , phenazopyridine , trazodone, venlafaxine xr, amitriptyline , and meloxicam .   Allergies: Patient is allergic to prednisone , methylprednisolone , and percocet [oxycodone -acetaminophen ].   Physical Exam: BP 117/83   Pulse 76   LMP 05/09/1993 (Approximate)    Pelvic Examination: suburethral incision well healed, no sutures present.    ---------------------------------------------------------  Assessment and Plan:  1. Overactive bladder     - Pathology results were reviewed with the patient today and she verbalized understanding  that the results were benign.  - Can resume regular activity including exercise and intercourse,  if desired.  - Discussed avoidance of heavy lifting and straining long term to reduce the risk of recurrence.  - For urgency symptoms, will start Myrbetriq 25mg    Return for 6-8 med follow up.  Arma Lamp, MD

## 2024-01-11 ENCOUNTER — Encounter: Payer: Medicaid Other | Admitting: Obstetrics and Gynecology

## 2024-01-24 ENCOUNTER — Telehealth: Payer: Self-pay | Admitting: Gastroenterology

## 2024-01-24 DIAGNOSIS — Z419 Encounter for procedure for purposes other than remedying health state, unspecified: Secondary | ICD-10-CM | POA: Diagnosis not present

## 2024-01-24 DIAGNOSIS — Z7689 Persons encountering health services in other specified circumstances: Secondary | ICD-10-CM | POA: Diagnosis not present

## 2024-01-24 DIAGNOSIS — M1712 Unilateral primary osteoarthritis, left knee: Secondary | ICD-10-CM | POA: Diagnosis not present

## 2024-01-24 NOTE — Telephone Encounter (Signed)
 The patient called in to verify and updated the information in her chart, including the insurance guarantor and other relevant details.

## 2024-02-02 ENCOUNTER — Encounter: Payer: Self-pay | Admitting: Internal Medicine

## 2024-02-02 ENCOUNTER — Other Ambulatory Visit: Payer: Self-pay

## 2024-02-02 ENCOUNTER — Ambulatory Visit: Admitting: Internal Medicine

## 2024-02-02 VITALS — BP 120/76 | HR 91 | Temp 98.2°F | Resp 16 | Ht 61.5 in | Wt 165.7 lb

## 2024-02-02 DIAGNOSIS — H1012 Acute atopic conjunctivitis, left eye: Secondary | ICD-10-CM

## 2024-02-02 DIAGNOSIS — Z7689 Persons encountering health services in other specified circumstances: Secondary | ICD-10-CM | POA: Diagnosis not present

## 2024-02-02 MED ORDER — LORATADINE 10 MG PO TABS: 10.0000 mg | ORAL_TABLET | Freq: Every day | ORAL | 11 refills | Status: AC

## 2024-02-02 NOTE — Progress Notes (Signed)
   Acute Office Visit  Subjective:     Patient ID: Kristina Reed, female    DOB: 23-May-1966, 58 y.o.   MRN: [id number]  Chief Complaint  Patient presents with   Eye Problem    Left eye    HPI Patient is in today for left eye pain.   Discussed the use of AI scribe software for clinical note transcription with the patient, who gave verbal consent to proceed.  History of Present Illness Kristina Reed is a 57 year old female who presents with swelling and tearing of the left eye.  She experiences swelling and increased tearing in her left eye with clear tearing and no goopy discharge. There is no pain with eye movement or vision changes. She wakes up with some crusting in her eye but not significant swelling. She does not take allergy medications like Claritin or Allegra and has not used any eye drops at home. No fevers or allergy symptoms.     Review of Systems  Constitutional:  Negative for chills and fever.  HENT:  Negative for congestion and sinus pain.   Eyes:  Positive for discharge. Negative for blurred vision, double vision, pain and redness.        Objective:    BP 120/76 (Cuff Size: Large)   Pulse 91   Temp 98.2 F (36.8 C) (Oral)   Resp 16   Ht 5' 1.5" (1.562 m)   Wt 165 lb 11.2 oz (75.2 kg)   LMP 05/09/1993 (Approximate)   SpO2 98%   BMI 30.80 kg/m  BP Readings from Last 3 Encounters:  02/02/24 120/76  01/10/24 117/83  11/29/23 (!) 125/91   Wt Readings from Last 3 Encounters:  02/02/24 165 lb 11.2 oz (75.2 kg)  11/29/23 173 lb (78.5 kg)  11/15/23 173 lb 3.2 oz (78.6 kg)      Physical Exam Constitutional:      Appearance: Normal appearance.  HENT:     Head: Normocephalic and atraumatic.  Eyes:     General:        Right eye: No foreign body, discharge or hordeolum.        Left eye: No foreign body, discharge or hordeolum.     Extraocular Movements: Extraocular movements intact.     Conjunctiva/sclera: Conjunctivae normal.     Pupils: Pupils are  equal, round, and reactive to light.     Comments: Small amount of swelling in the left upper eye lid but no other skin changes  Neurological:     Mental Status: She is alert.     No results found for any visits on 02/02/24.      Assessment & Plan:   Assessment & Plan Allergic conjunctivitis Swelling and tearing of the left eye likely due to allergies. No vision changes, infection, stye, or laceration. Allergies more likely given the time of year. Condition expected to improve on its own. - Prescribed loratadine for allergy symptoms. - Recommended over-the-counter ketotifen eye drops. - Suggested over-the-counter artificial tears. - Advised follow-up with an optometrist if symptoms persist.   Meds ordered this encounter  Medications   loratadine (CLARITIN) 10 MG tablet    Sig: Take 1 tablet (10 mg total) by mouth daily.    Dispense:  30 tablet    Refill:  11    Return if symptoms worsen or fail to improve.  Rockney Cid, DO

## 2024-02-02 NOTE — Patient Instructions (Addendum)
 It was great seeing you today!  Plan discussed at today's visit: -Recommend taking Claritin daily to help with allergic eye symptoms -Can also get over the counter lubricating drops, specifically Refresh drops and can also try an over the counter Zaditor (anti-histamine) drops as well  Follow up in: as needed  Take care and let us  know if you have any questions or concerns prior to your next visit.  Dr. Bud Care

## 2024-02-04 DIAGNOSIS — M62551 Muscle wasting and atrophy, not elsewhere classified, right thigh: Secondary | ICD-10-CM | POA: Diagnosis not present

## 2024-02-04 DIAGNOSIS — Z7689 Persons encountering health services in other specified circumstances: Secondary | ICD-10-CM | POA: Diagnosis not present

## 2024-02-04 DIAGNOSIS — Z96651 Presence of right artificial knee joint: Secondary | ICD-10-CM | POA: Diagnosis not present

## 2024-02-04 DIAGNOSIS — M25361 Other instability, right knee: Secondary | ICD-10-CM | POA: Diagnosis not present

## 2024-02-04 DIAGNOSIS — M25561 Pain in right knee: Secondary | ICD-10-CM | POA: Diagnosis not present

## 2024-02-23 ENCOUNTER — Ambulatory Visit: Admitting: Obstetrics and Gynecology

## 2024-02-24 DIAGNOSIS — Z419 Encounter for procedure for purposes other than remedying health state, unspecified: Secondary | ICD-10-CM | POA: Diagnosis not present

## 2024-03-06 DIAGNOSIS — Z1331 Encounter for screening for depression: Secondary | ICD-10-CM | POA: Diagnosis not present

## 2024-03-06 DIAGNOSIS — G8929 Other chronic pain: Secondary | ICD-10-CM | POA: Diagnosis not present

## 2024-03-06 DIAGNOSIS — F4321 Adjustment disorder with depressed mood: Secondary | ICD-10-CM | POA: Diagnosis not present

## 2024-03-06 DIAGNOSIS — M25561 Pain in right knee: Secondary | ICD-10-CM | POA: Diagnosis not present

## 2024-03-06 DIAGNOSIS — R4189 Other symptoms and signs involving cognitive functions and awareness: Secondary | ICD-10-CM | POA: Diagnosis not present

## 2024-03-06 DIAGNOSIS — Z7689 Persons encountering health services in other specified circumstances: Secondary | ICD-10-CM | POA: Diagnosis not present

## 2024-03-10 DIAGNOSIS — Z7689 Persons encountering health services in other specified circumstances: Secondary | ICD-10-CM | POA: Diagnosis not present

## 2024-03-10 DIAGNOSIS — S83242A Other tear of medial meniscus, current injury, left knee, initial encounter: Secondary | ICD-10-CM | POA: Diagnosis not present

## 2024-03-10 DIAGNOSIS — S86912D Strain of unspecified muscle(s) and tendon(s) at lower leg level, left leg, subsequent encounter: Secondary | ICD-10-CM | POA: Diagnosis not present

## 2024-03-20 ENCOUNTER — Ambulatory Visit: Admitting: Obstetrics and Gynecology

## 2024-03-23 DIAGNOSIS — Z7689 Persons encountering health services in other specified circumstances: Secondary | ICD-10-CM | POA: Diagnosis not present

## 2024-03-23 DIAGNOSIS — M25562 Pain in left knee: Secondary | ICD-10-CM | POA: Diagnosis not present

## 2024-03-25 DIAGNOSIS — Z419 Encounter for procedure for purposes other than remedying health state, unspecified: Secondary | ICD-10-CM | POA: Diagnosis not present

## 2024-03-28 DIAGNOSIS — Z7689 Persons encountering health services in other specified circumstances: Secondary | ICD-10-CM | POA: Diagnosis not present

## 2024-03-28 DIAGNOSIS — M25562 Pain in left knee: Secondary | ICD-10-CM | POA: Diagnosis not present

## 2024-03-30 DIAGNOSIS — M25562 Pain in left knee: Secondary | ICD-10-CM | POA: Diagnosis not present

## 2024-03-30 DIAGNOSIS — Z7689 Persons encountering health services in other specified circumstances: Secondary | ICD-10-CM | POA: Diagnosis not present

## 2024-04-04 DIAGNOSIS — M25562 Pain in left knee: Secondary | ICD-10-CM | POA: Diagnosis not present

## 2024-04-04 DIAGNOSIS — Z7689 Persons encountering health services in other specified circumstances: Secondary | ICD-10-CM | POA: Diagnosis not present

## 2024-04-06 ENCOUNTER — Other Ambulatory Visit: Payer: Self-pay | Admitting: Internal Medicine

## 2024-04-06 DIAGNOSIS — Z7689 Persons encountering health services in other specified circumstances: Secondary | ICD-10-CM | POA: Diagnosis not present

## 2024-04-06 DIAGNOSIS — Z1231 Encounter for screening mammogram for malignant neoplasm of breast: Secondary | ICD-10-CM

## 2024-04-06 DIAGNOSIS — M25562 Pain in left knee: Secondary | ICD-10-CM | POA: Diagnosis not present

## 2024-04-10 ENCOUNTER — Encounter: Payer: Self-pay | Admitting: Obstetrics and Gynecology

## 2024-04-10 ENCOUNTER — Ambulatory Visit (INDEPENDENT_AMBULATORY_CARE_PROVIDER_SITE_OTHER): Admitting: Obstetrics and Gynecology

## 2024-04-10 VITALS — BP 105/71 | HR 75

## 2024-04-10 DIAGNOSIS — N3281 Overactive bladder: Secondary | ICD-10-CM | POA: Diagnosis not present

## 2024-04-10 DIAGNOSIS — R351 Nocturia: Secondary | ICD-10-CM | POA: Diagnosis not present

## 2024-04-10 MED ORDER — VIBEGRON 75 MG PO TABS: 1.0000 | ORAL_TABLET | Freq: Every day | ORAL | 5 refills | Status: DC

## 2024-04-10 NOTE — Progress Notes (Signed)
 [facility] Return Visit  SUBJECTIVE  History of Present Illness: Kristina Reed is a 59 y.o. female seen in follow-up for overactive bladder. Plan at last visit was to start Myrbetriq  25mg . She is s/p Urethral diverticulectomy, cystoscopy on 11/29/23.   Has peed the bed several times. She drinks about 0.5 glass water  before bed. Also has some leakage with water  running or when she gets a sudden urge. She does not feel the medication is working well for her.   She does not have any SUI symptoms.   She reports that she has sleep apnea but does not have a CPAP.   Past Medical History: Patient  has a past medical history of Anxiety, Arthritis, Cancer (HCC), Chest pain, Cognitive impairment, mild, so stated, Depression, Diverticulosis, Dyspnea on exertion, Eosinophilic esophagitis (09/03/2017), Esophageal dysphagia, Family history of breast cancer (05/2021), Gallstones, GERD (gastroesophageal reflux disease), History of cervical cancer, cervical cancer (01/30/2016), Hypertension, Hypothyroidism, Palpitations, Sleep apnea, Status post partial hysterectomy, and Vaginal inclusion cyst.   Past Surgical History: She  has a past surgical history that includes Vaginal hysterectomy; Colonoscopy with propofol  (N/A, 06/27/2015); Tubal ligation; Knee arthroscopy (Right, 07/22/2016); Esophagogastroduodenoscopy (egd) with propofol  (N/A, 07/22/2017); Colonoscopy with propofol  (N/A, 09/23/2017); Knee arthroscopy with medial menisectomy (Left, 11/10/2017); Esophagogastroduodenoscopy (egd) with propofol  (N/A, 03/31/2018); Breast biopsy (Bilateral, 01/01/2016); Colonoscopy with propofol  (N/A, 06/09/2021); Esophagogastroduodenoscopy (egd) with propofol  (N/A, 08/25/2021); Total knee arthroplasty (Right, 11/24/2021); Esophagogastroduodenoscopy (egd) with propofol  (N/A, 11/20/2022); Urethral cyst removal (N/A, 11/29/2023); and Cystoscopy (N/A, 11/29/2023).   Medications: She has a current medication list which  includes the following prescription(s): vibegron , amitriptyline , estradiol , fluoxetine , ibuprofen , levothyroxine , lisinopril , loratadine , meloxicam , multivitamin with minerals, omeprazole , phenazopyridine , trazodone, and venlafaxine xr.   Allergies: Patient is allergic to prednisone , methylprednisolone , and percocet [oxycodone -acetaminophen ].   Social History: Patient  reports that she has never smoked. She has never been exposed to tobacco smoke. She has never used smokeless tobacco. She reports that she does not currently use alcohol. She reports that she does not use drugs.     OBJECTIVE     Physical Exam: Vitals:   04/10/24 1515  BP: 105/71  Pulse: 75   Gen: No apparent distress, A&O x 3.  Detailed Urogynecologic Evaluation:  Deferred.    ASSESSMENT AND PLAN    Ms. Solow is a 58 y.o. with:  1. Overactive bladder     Overactive bladder -     Vibegron ; Take 1 tablet (75 mg total) by mouth daily.  Dispense: 30 tablet; Refill: 5  - Sampled provided of Gemtesa . Will likely need prior authorization.  - Reviewed options of Intravesical botox, PTNS and sacral nerve stimulation. She is not interested in the botox. Handouts provided for her to review on nerve stimulation therapies if Gemtesa  does not improve symptoms.  - Will message PCP regarding CPAP.   Follow up 2 months   Rosaline LOISE Caper, MD

## 2024-04-10 NOTE — Patient Instructions (Addendum)
 Stop Myrbetriq  and star Gemtesa  for bladder symptoms.

## 2024-04-11 DIAGNOSIS — Z7689 Persons encountering health services in other specified circumstances: Secondary | ICD-10-CM | POA: Diagnosis not present

## 2024-04-11 DIAGNOSIS — M25562 Pain in left knee: Secondary | ICD-10-CM | POA: Diagnosis not present

## 2024-04-12 ENCOUNTER — Ambulatory Visit: Admitting: Internal Medicine

## 2024-04-13 DIAGNOSIS — Z7689 Persons encountering health services in other specified circumstances: Secondary | ICD-10-CM | POA: Diagnosis not present

## 2024-04-13 DIAGNOSIS — M25562 Pain in left knee: Secondary | ICD-10-CM | POA: Diagnosis not present

## 2024-04-18 DIAGNOSIS — M25562 Pain in left knee: Secondary | ICD-10-CM | POA: Diagnosis not present

## 2024-04-20 ENCOUNTER — Telehealth: Payer: Self-pay

## 2024-04-20 NOTE — Telephone Encounter (Signed)
 Pt already scheduled

## 2024-04-21 DIAGNOSIS — M1712 Unilateral primary osteoarthritis, left knee: Secondary | ICD-10-CM | POA: Diagnosis not present

## 2024-04-24 ENCOUNTER — Ambulatory Visit: Admitting: Internal Medicine

## 2024-04-25 DIAGNOSIS — Z419 Encounter for procedure for purposes other than remedying health state, unspecified: Secondary | ICD-10-CM | POA: Diagnosis not present

## 2024-05-17 ENCOUNTER — Ambulatory Visit
Admission: RE | Admit: 2024-05-17 | Discharge: 2024-05-17 | Disposition: A | Discharge status: Home / Self Care | Source: Ambulatory Visit | Attending: Internal Medicine | Admitting: Internal Medicine

## 2024-05-17 DIAGNOSIS — Z1231 Encounter for screening mammogram for malignant neoplasm of breast: Secondary | ICD-10-CM | POA: Insufficient documentation

## 2024-05-18 DIAGNOSIS — Z96651 Presence of right artificial knee joint: Secondary | ICD-10-CM | POA: Diagnosis not present

## 2024-05-18 DIAGNOSIS — M7631 Iliotibial band syndrome, right leg: Secondary | ICD-10-CM | POA: Diagnosis not present

## 2024-05-18 DIAGNOSIS — M1712 Unilateral primary osteoarthritis, left knee: Secondary | ICD-10-CM | POA: Diagnosis not present

## 2024-05-18 DIAGNOSIS — Z7689 Persons encountering health services in other specified circumstances: Secondary | ICD-10-CM | POA: Diagnosis not present

## 2024-05-22 ENCOUNTER — Ambulatory Visit: Payer: Self-pay | Admitting: Internal Medicine

## 2024-05-26 DIAGNOSIS — Z419 Encounter for procedure for purposes other than remedying health state, unspecified: Secondary | ICD-10-CM | POA: Diagnosis not present

## 2024-05-29 ENCOUNTER — Telehealth: Payer: Self-pay

## 2024-05-29 NOTE — Telephone Encounter (Signed)
 Copied from CRM #[id number]. Topic: Clinical - Order For Equipment >> May 29, 2024 11:28 AM Leonette SQUIBB wrote: Reason for CRM: pt needs an order for adult diapers. She said she has never gotten them before but is in needs now.  CB#[phone]

## 2024-05-29 NOTE — Telephone Encounter (Signed)
 Will need appt to address

## 2024-05-30 ENCOUNTER — Ambulatory Visit: Payer: Self-pay

## 2024-05-30 NOTE — Telephone Encounter (Signed)
 Make appointment.

## 2024-05-30 NOTE — Telephone Encounter (Signed)
 FYI Only or Action Required?: FYI only for provider.  Patient was last seen in primary care on 02/02/2024 by Bernardo Fend, DO.  Called Nurse Triage reporting Rectal Bleeding.  Symptoms began several weeks ago.  Interventions attempted: Nothing.  Symptoms are: stable.  Triage Disposition: See PCP When Office is Open (Within 3 Days)  Patient/caregiver understands and will follow disposition?: Yes- Patient reports intermittent rectal bleeding and loose stools over the past 10 days. Pt denies dizziness or shortness of breath.  RN offered visit for tomorrow with PCP, but pt says that she need a 72 notice to arrange transportation.  Appt schedule for 9/19, Care Advice give. Pt verbalized understanding  Copied from CRM #[id number]. Topic: Clinical - Red Word Triage >> May 30, 2024  9:18 AM Pinkey ORN wrote: Red Word that prompted transfer to Nurse Triage: Blood In Stool >> May 30, 2024  9:19 AM Pinkey ORN wrote: Patient states that on Saturday, Sept 6th she was unable to hold her bowels. Patient states she had loose / watery stool and it had blood in it.  Reason for Disposition  MILD rectal bleeding (e.g., more than just a few drops or streaks)  Answer Assessment - Initial Assessment Questions 1. APPEARANCE of BLOOD: What color is it? Is it passed separately, on the surface of the stool, or mixed in with the stool?      Mixed in with stool  2. AMOUNT: How much blood was passed?      It was really big, heavy amount of blood  3. FREQUENCY: How many times has blood been passed with the stools?      3-4 times  4. ONSET: When was the blood first seen in the stools? (Days or weeks)      Started on Saturday 9/6   5. DIARRHEA: Is there also some diarrhea? If Yes, ask: How many diarrhea stools in the past 24 hours?      Is not having diarrhea now, but says that when she leaves the house she has an episode on herself  6. CONSTIPATION: Do you have constipation? If Yes,  ask: How bad is it?     Not constipation  7. RECURRENT SYMPTOMS: Have you had blood in your stools before? If Yes, ask: When was the last time? and What happened that time?      Has had blood in her stool a couple of years ago. She had a colonoscopy done but there was no finding  8. BLOOD THINNERS: Do you take any blood thinners? (e.g., aspirin , clopidogrel / Plavix, coumadin, heparin). Notes: Other strong blood thinners include: Arixtra (fondaparinux), Eliquis (apixaban), Pradaxa (dabigatran), and Xarelto (rivaroxaban).     No  9. OTHER SYMPTOMS: Do you have any other symptoms?  (e.g., abdomen pain, vomiting, dizziness, fever)     No  10. PREGNANCY: Is there any chance you are pregnant? When was your last menstrual period?       No  Protocols used: Rectal Bleeding-A-AH

## 2024-05-30 NOTE — Telephone Encounter (Signed)
 Lvm asking pt to return call to schedule appointment

## 2024-06-02 ENCOUNTER — Ambulatory Visit: Admitting: Nurse Practitioner

## 2024-06-05 ENCOUNTER — Other Ambulatory Visit: Payer: Self-pay | Admitting: Internal Medicine

## 2024-06-05 DIAGNOSIS — E039 Hypothyroidism, unspecified: Secondary | ICD-10-CM

## 2024-06-06 NOTE — Telephone Encounter (Signed)
 Requested medication (s) are due for refill today: yes  Requested medication (s) are on the active medication list: yes  Last refill:  04/15/23  Future visit scheduled: yes  Notes to clinic:  Unable to refill per protocol due to failed labs, no updated results.      Requested Prescriptions  Pending Prescriptions Disp Refills   levothyroxine  (SYNTHROID ) 75 MCG tablet [Pharmacy Med Name: Levothyroxine  Sodium 75 MCG Oral Tablet] 90 tablet 0    Sig: TAKE 1 TABLET BY MOUTH ONCE DAILY BEFORE BREAKFAST     Endocrinology:  Hypothyroid Agents Failed - 06/06/2024 11:14 AM      Failed - TSH in normal range and within 360 days    TSH  Date Value Ref Range Status  10/05/2022 4.48 mIU/L Final    Comment:              Reference Range .           > or = 20 Years  0.40-4.50 .                Pregnancy Ranges           First trimester    0.26-2.66           Second trimester   0.55-2.73           Third trimester    0.43-2.91          Failed - Valid encounter within last 12 months    Recent Outpatient Visits           4 months ago Allergic conjunctivitis of left eye   [facility] Bernardo Fend, DO       Future Appointments             In 2 weeks Marilynne, Rosaline SAILOR, MD [facility]cology at [facility], [facility]

## 2024-06-12 ENCOUNTER — Encounter: Payer: Self-pay | Admitting: Internal Medicine

## 2024-06-12 ENCOUNTER — Other Ambulatory Visit: Payer: Self-pay

## 2024-06-12 ENCOUNTER — Ambulatory Visit: Admitting: Internal Medicine

## 2024-06-12 VITALS — BP 122/70 | HR 93 | Temp 97.8°F | Resp 16 | Ht 61.5 in | Wt 157.6 lb

## 2024-06-12 DIAGNOSIS — Z13220 Encounter for screening for lipoid disorders: Secondary | ICD-10-CM | POA: Diagnosis not present

## 2024-06-12 DIAGNOSIS — Z23 Encounter for immunization: Secondary | ICD-10-CM

## 2024-06-12 DIAGNOSIS — E039 Hypothyroidism, unspecified: Secondary | ICD-10-CM | POA: Diagnosis not present

## 2024-06-12 DIAGNOSIS — N3946 Mixed incontinence: Secondary | ICD-10-CM

## 2024-06-12 NOTE — Progress Notes (Signed)
   Acute Office Visit  Subjective:     Patient ID: Kristina Reed, female    DOB: 11-04-1965, 58 y.o.   MRN: [id number]  Chief Complaint  Patient presents with   Urinary Frequency    Discuss incontinence supplies    HPI Patient is in today for urinary incontinance.   Discussed the use of AI scribe software for clinical note transcription with the patient, who gave verbal consent to proceed.  History of Present Illness Kristina Reed is a 58 year old female who presents for documentation of symptoms for insurance coverage of supplies related to urge incontinence.  She experiences urge incontinence with a sudden, intense urge to urinate, leading to involuntary leakage. This occurs five to six times a week, sometimes multiple times a day, and is described as 'all at once'. Leakage occurs during activities such as washing dishes, standing, or sleeping, but not daily. There are no issues with stress incontinence, pain, or hematuria.  A cystoscopy previously revealed a lesion on her urethra, she is following with Urology yearly and has an upcoming appointment next month.   She has cataracts in her left eye and is scheduling surgery in [facility]. She also has an issue with her thyroid  medication supply and has not had her thyroid  levels checked in a year.    Review of Systems  Gastrointestinal:  Negative for abdominal pain.  Genitourinary:  Positive for frequency and urgency. Negative for dysuria, flank pain and hematuria.        Objective:    BP 122/70 (Cuff Size: Large)   Pulse 93   Temp 97.8 F (36.6 C) (Oral)   Resp 16   Ht 5' 1.5 (1.562 m)   Wt 157 lb 9.6 oz (71.5 kg)   LMP 05/09/1993 (Approximate)   SpO2 99%   BMI 29.30 kg/m    Physical Exam Constitutional:      Appearance: Normal appearance.  HENT:     Head: Normocephalic and atraumatic.  Eyes:     Conjunctiva/sclera: Conjunctivae normal.  Cardiovascular:     Rate and Rhythm: Normal rate and regular rhythm.   Pulmonary:     Effort: Pulmonary effort is normal.     Breath sounds: Normal breath sounds.  Skin:    General: Skin is warm and dry.  Neurological:     General: No focal deficit present.     Mental Status: She is alert. Mental status is at baseline.  Psychiatric:        Mood and Affect: Mood normal.        Behavior: Behavior normal.     No results found for any visits on 06/12/24.      Assessment & Plan:   Assessment & Plan Mixed incontinence Urge/stress incontinence with frequent episodes, no pain or hematuria, previous urethral lesion noted on cystoscopy, no recent urology follow-up. - Document symptoms for insurance coverage of incontinence supplies. - Encourage urology follow-up in October.  Hypothyroidism Hypothyroidism with medication lapse due to lack of recent thyroid  function testing, potential impact on bowel function. - Order thyroid  function tests. - Refill thyroid  medication pending lab results.  - CBC w/Diff/Platelet - Comprehensive Metabolic Panel (CMET) - For home use only DME Other see comment - Flu vaccine trivalent PF, 6mos and older(Flulaval,Afluria,Fluarix,Fluzone) - TSH - Lipid Profile   Return in about 6 months (around 12/10/2024).  Sharyle Fischer, DO

## 2024-06-13 ENCOUNTER — Ambulatory Visit: Payer: Self-pay | Admitting: Internal Medicine

## 2024-06-13 DIAGNOSIS — H25812 Combined forms of age-related cataract, left eye: Secondary | ICD-10-CM | POA: Diagnosis not present

## 2024-06-13 DIAGNOSIS — H2511 Age-related nuclear cataract, right eye: Secondary | ICD-10-CM | POA: Diagnosis not present

## 2024-06-13 DIAGNOSIS — E039 Hypothyroidism, unspecified: Secondary | ICD-10-CM

## 2024-06-13 LAB — COMPREHENSIVE METABOLIC PANEL WITH GFR
AG Ratio: 1.5 (calc) (ref 1.0–2.5)
ALT: 14 U/L (ref 6–29)
AST: 16 U/L (ref 10–35)
Albumin: 4.3 g/dL (ref 3.6–5.1)
Alkaline phosphatase (APISO): 84 U/L (ref 37–153)
BUN: 10 mg/dL (ref 7–25)
CO2: 31 mmol/L (ref 20–32)
Calcium: 9.1 mg/dL (ref 8.6–10.4)
Chloride: 98 mmol/L (ref 98–110)
Creat: 0.9 mg/dL (ref 0.50–1.03)
Globulin: 2.8 g/dL (ref 1.9–3.7)
Glucose, Bld: 99 mg/dL (ref 65–99)
Potassium: 3.7 mmol/L (ref 3.5–5.3)
Sodium: 138 mmol/L (ref 135–146)
Total Bilirubin: 0.2 mg/dL (ref 0.2–1.2)
Total Protein: 7.1 g/dL (ref 6.1–8.1)
eGFR: 75 mL/min/1.73m2 (ref >=60)

## 2024-06-13 LAB — CBC WITH DIFFERENTIAL/PLATELET
Absolute Lymphocytes: 2618 {cells}/uL (ref 850–3900)
Absolute Monocytes: 415 {cells}/uL (ref 200–950)
Basophils Absolute: 48 {cells}/uL (ref 0–200)
Basophils Relative: 0.7 %
Eosinophils Absolute: 231 {cells}/uL (ref 15–500)
Eosinophils Relative: 3.4 %
HCT: 39.8 % (ref 35.0–45.0)
Hemoglobin: 12.7 g/dL (ref 11.7–15.5)
MCH: 27.7 pg (ref 27.0–33.0)
MCHC: 31.9 g/dL — ABNORMAL LOW (ref 32.0–36.0)
MCV: 86.7 fL (ref 80.0–100.0)
MPV: 10.9 fL (ref 7.5–12.5)
Monocytes Relative: 6.1 %
Neutro Abs: 3488 {cells}/uL (ref 1500–7800)
Neutrophils Relative %: 51.3 %
Platelets: 238 Thousand/uL (ref 140–400)
RBC: 4.59 Million/uL (ref 3.80–5.10)
RDW: 13 % (ref 11.0–15.0)
Total Lymphocyte: 38.5 %
WBC: 6.8 Thousand/uL (ref 3.8–10.8)

## 2024-06-13 LAB — LIPID PANEL
Cholesterol: 212 mg/dL — ABNORMAL HIGH (ref <200)
HDL: 57 mg/dL (ref >=50)
LDL Cholesterol (Calc): 128 mg/dL — ABNORMAL HIGH
Non-HDL Cholesterol (Calc): 155 mg/dL — ABNORMAL HIGH (ref <130)
Total CHOL/HDL Ratio: 3.7 (calc) (ref <5.0)
Triglycerides: 152 mg/dL — ABNORMAL HIGH (ref <150)

## 2024-06-13 LAB — TSH: TSH: 2.23 m[IU]/L (ref 0.40–4.50)

## 2024-06-13 MED ORDER — LEVOTHYROXINE SODIUM 75 MCG PO TABS: 75.0000 ug | ORAL_TABLET | Freq: Every day | ORAL | 1 refills | Status: AC

## 2024-06-15 DIAGNOSIS — N3946 Mixed incontinence: Secondary | ICD-10-CM | POA: Diagnosis not present

## 2024-06-20 ENCOUNTER — Ambulatory Visit: Admitting: Obstetrics and Gynecology

## 2024-06-20 ENCOUNTER — Other Ambulatory Visit: Payer: Self-pay | Admitting: Internal Medicine

## 2024-06-20 NOTE — Progress Notes (Deleted)
 [facility] Return Visit  SUBJECTIVE  History of Present Illness: Kristina Reed is a 58 y.o. female seen in follow-up for overactive bladder. Plan at last visit was to start Myrbetriq  25mg . She is s/p Urethral diverticulectomy, cystoscopy on 11/29/23.   Has peed the bed several times. She drinks about 0.5 glass water  before bed. Also has some leakage with water  running or when she gets a sudden urge. She does not feel the medication is working well for her.   She does not have any SUI symptoms.   She reports that she has sleep apnea but does not have a CPAP.   Past Medical History: Patient  has a past medical history of Anxiety, Arthritis, Cancer (HCC), Chest pain, Cognitive impairment, mild, so stated, Depression, Diverticulosis, Dyspnea on exertion, Eosinophilic esophagitis (09/03/2017), Esophageal dysphagia, Family history of breast cancer (05/2021), Gallstones, GERD (gastroesophageal reflux disease), History of cervical cancer, cervical cancer (01/30/2016), Hypertension, Hypothyroidism, Palpitations, Sleep apnea, Status post partial hysterectomy, and Vaginal inclusion cyst.   Past Surgical History: She  has a past surgical history that includes Vaginal hysterectomy; Colonoscopy with propofol  (N/A, 06/27/2015); Tubal ligation; Knee arthroscopy (Right, 07/22/2016); Esophagogastroduodenoscopy (egd) with propofol  (N/A, 07/22/2017); Colonoscopy with propofol  (N/A, 09/23/2017); Knee arthroscopy with medial menisectomy (Left, 11/10/2017); Esophagogastroduodenoscopy (egd) with propofol  (N/A, 03/31/2018); Breast biopsy (Bilateral, 01/01/2016); Colonoscopy with propofol  (N/A, 06/09/2021); Esophagogastroduodenoscopy (egd) with propofol  (N/A, 08/25/2021); Total knee arthroplasty (Right, 11/24/2021); Esophagogastroduodenoscopy (egd) with propofol  (N/A, 11/20/2022); Urethral cyst removal (N/A, 11/29/2023); and Cystoscopy (N/A, 11/29/2023).   Medications: She has a current medication list which  includes the following prescription(s): amitriptyline , estradiol , fluoxetine , ibuprofen , levothyroxine , lisinopril , loratadine , meloxicam , multivitamin with minerals, omeprazole , phenazopyridine , trazodone, venlafaxine xr, and vibegron .   Allergies: Patient is allergic to prednisone , methylprednisolone , and percocet [oxycodone -acetaminophen ].   Social History: Patient  reports that she has never smoked. She has never been exposed to tobacco smoke. She has never used smokeless tobacco. She reports that she does not currently use alcohol. She reports that she does not use drugs.     OBJECTIVE     Physical Exam: There were no vitals filed for this visit.  Gen: No apparent distress, A&O x 3.  Detailed Urogynecologic Evaluation:  Deferred.    ASSESSMENT AND PLAN    Kristina Reed is a 58 y.o. with:  No diagnosis found.   There are no diagnoses linked to this encounter. - Sampled provided of Gemtesa . Will likely need prior authorization.  - Reviewed options of Intravesical botox, PTNS and sacral nerve stimulation. She is not interested in the botox. Handouts provided for her to review on nerve stimulation therapies if Gemtesa  does not improve symptoms.  - Will message PCP regarding CPAP.   Follow up 2 months   Kristina LOISE Caper, MD

## 2024-06-28 DIAGNOSIS — Q6671 Congenital pes cavus, right foot: Secondary | ICD-10-CM | POA: Diagnosis not present

## 2024-06-28 DIAGNOSIS — Z7689 Persons encountering health services in other specified circumstances: Secondary | ICD-10-CM | POA: Diagnosis not present

## 2024-06-28 DIAGNOSIS — B351 Tinea unguium: Secondary | ICD-10-CM | POA: Diagnosis not present

## 2024-06-28 DIAGNOSIS — M79674 Pain in right toe(s): Secondary | ICD-10-CM | POA: Diagnosis not present

## 2024-06-28 DIAGNOSIS — Q6672 Congenital pes cavus, left foot: Secondary | ICD-10-CM | POA: Diagnosis not present

## 2024-06-28 DIAGNOSIS — M792 Neuralgia and neuritis, unspecified: Secondary | ICD-10-CM | POA: Diagnosis not present

## 2024-06-28 DIAGNOSIS — M79675 Pain in left toe(s): Secondary | ICD-10-CM | POA: Diagnosis not present

## 2024-07-05 ENCOUNTER — Ambulatory Visit: Payer: Self-pay

## 2024-07-05 NOTE — Telephone Encounter (Signed)
 FYI Only or Action Required?: FYI only for provider.  Patient was last seen in primary care on 06/12/2024 by Bernardo Fend, DO.  Called Nurse Triage reporting Foot Swelling.  Symptoms began several weeks ago.  Interventions attempted: Ice/heat application.  Symptoms are: unchanged.  Triage Disposition: See HCP Within 4 Hours (Or PCP Triage)  Patient/caregiver understands and will follow disposition?: Unsure  Pt requested appt on Friday due to transportation. Pt advised when to go to ER, pt stated understanding.    Copied from CRM [id number]. Topic: Clinical - Red Word Triage >> Jul 05, 2024 11:54 AM Ivette P wrote: Red Word that prompted transfer to Nurse Triage: Left foot. Unable to walk. Reason for Disposition  [1] SEVERE pain (e.g., excruciating, unable to do any normal activities) AND [2] not improved after 2 hours of pain medicine  Answer Assessment - Initial Assessment Questions 1. ONSET: When did the swelling start? (e.g., minutes, hours, days)     Couple of weeks ago 2. LOCATION: What part of the leg is swollen?  Are both legs swollen or just one leg?     Outside of left foot 3. SEVERITY: How bad is the swelling? (e.g., localized; mild, moderate, severe)     Mild-mod only on outside of foot 4. REDNESS: Is there redness or signs of infection?     no 5. PAIN: Is the swelling painful to touch? If Yes, ask: How painful is it?   (Scale 1-10; mild, moderate or severe)     10 6. FEVER: Do you have a fever? If Yes, ask: What is it, how was it measured, and when did it start?      no 7. CAUSE: What do you think is causing the leg swelling?     unsure  10. OTHER SYMPTOMS: Do you have any other symptoms? (e.g., chest pain, difficulty breathing)       no  Answer Assessment - Initial Assessment Questions 1. ONSET: When did the pain start?      A couple of weeks ago 2. LOCATION: Where is the pain located?      Left side of left foot 3. PAIN: How  bad is the pain?    (Scale 1-10; or mild, moderate, severe)     10 4. WORK OR EXERCISE: Has there been any recent work or exercise that involved this part of the body?      no 5. CAUSE: What do you think is causing the foot pain?     unsure 6. OTHER SYMPTOMS: Do you have any other symptoms? (e.g., leg pain, rash, fever, numbness)     none  Protocols used: Leg Swelling and Edema-A-AH, Foot Pain-A-AH

## 2024-07-05 NOTE — Telephone Encounter (Signed)
 Lvm for pt to call back and schedule an appt.

## 2024-07-07 ENCOUNTER — Ambulatory Visit

## 2024-07-18 ENCOUNTER — Telehealth: Payer: Self-pay | Admitting: Internal Medicine

## 2024-07-18 DIAGNOSIS — H547 Unspecified visual loss: Secondary | ICD-10-CM | POA: Diagnosis not present

## 2024-07-18 DIAGNOSIS — H2512 Age-related nuclear cataract, left eye: Secondary | ICD-10-CM | POA: Diagnosis not present

## 2024-07-18 NOTE — Telephone Encounter (Unsigned)
 Copied from CRM #[id number]. Topic: Referral - Question >> Jul 18, 2024  2:11 PM Terri G wrote: Reason for CRM: Patient wanted to know if Dr.Andrews can refer to an eye doctor for eye issues. Callback number [phone]

## 2024-07-19 ENCOUNTER — Other Ambulatory Visit: Payer: Self-pay | Admitting: Internal Medicine

## 2024-07-19 DIAGNOSIS — F331 Major depressive disorder, recurrent, moderate: Secondary | ICD-10-CM

## 2024-07-19 DIAGNOSIS — M1712 Unilateral primary osteoarthritis, left knee: Secondary | ICD-10-CM | POA: Diagnosis not present

## 2024-07-19 DIAGNOSIS — Z96651 Presence of right artificial knee joint: Secondary | ICD-10-CM | POA: Diagnosis not present

## 2024-07-19 DIAGNOSIS — S86912A Strain of unspecified muscle(s) and tendon(s) at lower leg level, left leg, initial encounter: Secondary | ICD-10-CM | POA: Diagnosis not present

## 2024-07-19 NOTE — Telephone Encounter (Signed)
 Tried to call pt has appt already for tomorrow

## 2024-07-20 NOTE — Telephone Encounter (Signed)
 Requested medications are due for refill today.  yes  Requested medications are on the active medications list.  yes  Last refill. 04/15/2023 #90 1 rf  Future visit scheduled.   Yes - next year.   Notes to clinic.  Pt was seen in office 06/12/2024, but has not had a yearly recently. Pt has missed several appts. Please review for refill.    Requested Prescriptions  Pending Prescriptions Disp Refills   FLUoxetine  (PROZAC ) 20 MG capsule [Pharmacy Med Name: FLUoxetine  HCl 20 MG Oral Capsule] 90 capsule 0    Sig: Take 1 capsule by mouth once daily     Psychiatry:  Antidepressants - SSRI Failed - 07/20/2024  5:51 PM      Failed - Completed PHQ-2 or PHQ-9 in the last 360 days      Failed - Valid encounter within last 6 months    Recent Outpatient Visits           1 month ago Mixed stress and urge urinary incontinence   [facility] Bernardo Fend, DO   5 months ago Allergic conjunctivitis of left eye   [facility] Bernardo Fend, DO       Future Appointments             In 1 week Marilynne, Rosaline SAILOR, MD [facility] at [facility], [facility]

## 2024-07-21 ENCOUNTER — Ambulatory Visit: Admitting: Internal Medicine

## 2024-07-21 NOTE — Telephone Encounter (Signed)
 Can't refill giving me contraindications

## 2024-07-26 DIAGNOSIS — Z419 Encounter for procedure for purposes other than remedying health state, unspecified: Secondary | ICD-10-CM | POA: Diagnosis not present

## 2024-07-27 DIAGNOSIS — H25813 Combined forms of age-related cataract, bilateral: Secondary | ICD-10-CM | POA: Diagnosis not present

## 2024-08-01 ENCOUNTER — Ambulatory Visit: Admitting: Obstetrics and Gynecology

## 2024-08-07 DIAGNOSIS — H60532 Acute contact otitis externa, left ear: Secondary | ICD-10-CM | POA: Diagnosis not present

## 2024-08-07 DIAGNOSIS — T162XXA Foreign body in left ear, initial encounter: Secondary | ICD-10-CM | POA: Diagnosis not present

## 2024-08-07 DIAGNOSIS — H6121 Impacted cerumen, right ear: Secondary | ICD-10-CM | POA: Diagnosis not present

## 2024-08-07 DIAGNOSIS — H903 Sensorineural hearing loss, bilateral: Secondary | ICD-10-CM | POA: Diagnosis not present

## 2024-08-09 ENCOUNTER — Ambulatory Visit: Admitting: Obstetrics and Gynecology

## 2024-08-22 DIAGNOSIS — R4189 Other symptoms and signs involving cognitive functions and awareness: Secondary | ICD-10-CM | POA: Diagnosis not present

## 2024-08-22 DIAGNOSIS — M25561 Pain in right knee: Secondary | ICD-10-CM | POA: Diagnosis not present

## 2024-08-22 DIAGNOSIS — F4321 Adjustment disorder with depressed mood: Secondary | ICD-10-CM | POA: Diagnosis not present

## 2024-08-22 DIAGNOSIS — Z1331 Encounter for screening for depression: Secondary | ICD-10-CM | POA: Diagnosis not present

## 2024-08-22 DIAGNOSIS — G8929 Other chronic pain: Secondary | ICD-10-CM | POA: Diagnosis not present

## 2024-08-22 NOTE — Progress Notes (Signed)
 Today the history is gathered from: 100% - patient  0% - alone   RECORDS SUMMARY: No new outside records  REFERRING PHYSICIAN: Lane, Arthea Locus, MD PRIMARY CARE PHYSICIAN:  Bernardo Fend, DO   IMPRESSION/PLAN  Kristina Reed is a 58 y.o. female presenting for evaluation of  PSEUDODEMENTIA/ FHX OF DEMENTIA/ MEMORY LOSS/ BALANCE DIFFICULTY/ HEADACHES/ DEPRESSION -Ongoing. - Patient with history of pseudodementia.  Feels her memory is stable.  Some difficulty concentrating, repeating questions and conversations, difficulty with short term memory.  Sleep is better with trazodone.  Having headaches occasionally.  Taking trazodone 100 mg at night.  Still having some crying. - Continue trazodone 100 mg, can take 1.5-2 tabs nightly for sleep. - Start Abilify 2 mg nightly for mood. - Continue Effexor 75mg  XR, two capsules nightly.  Medications previously tried:  Follow up 3 months  p=4   CHIEF COMPLAINT & HPI  Kristina Reed is a 58 y.o. female presenting for evaluation of: Chief Complaint  Patient presents with   Memory Loss    PSEUDODEMENTIA/ FHX OF DEMENTIA/ MEMORY LOSS/ BALANCE DIFFICULTY/ HEADACHES/ DEPRESSION Patient states that she feels her memory is not worse.  Feels it is the same.  States that she has difficulty focusing on a task.  Repeats questions or conversations.  Has been out of effexor since end of November.  Patient states headaches come and go.  Every other day.  Sleeping well with trazodone.  Taking 100 mg at night.    DATA SUMMARY:  08/17/2023 MR BRAIN WO CONTRAST IMPRESSION:  1. Stable brain MRI compared to 2014. No explanation for memory  loss.  2. Low and somewhat flattened cerebellar tonsils, question symptoms  of Chiari 1 malformation.   VISIT SUMMARIES:   MEDICATIONS Current Outpatient Medications  Medication Sig Dispense Refill   amitriptyline  (ELAVIL ) 25 MG tablet Take 1 tablet by mouth at bedtime     celecoxib (CELEBREX) 200 MG capsule Take  200 mg by mouth once daily     estradioL  (ESTRACE ) 0.5 MG tablet Take 1 tablet by mouth once daily     fluconazole  (DIFLUCAN ) 100 MG tablet Take 100 mg by mouth once daily     FLUoxetine  (PROZAC ) 20 MG capsule Take 1 capsule by mouth once daily     GEMTESA  75 mg Tab Take 1 tablet by mouth once daily     levothyroxine  (SYNTHROID , LEVOTHROID) 75 MCG tablet Take by mouth.     lisinopriL  (ZESTRIL ) 5 MG tablet Take 5 mg by mouth once daily     loratadine  (CLARITIN ) 10 mg tablet Take 10 mg by mouth once daily     meloxicam  (MOBIC ) 15 MG tablet Take 15 mg by mouth once daily     mirabegron  (MYRBETRIQ ) 25 mg ER Tablet Take 1 tablet by mouth once daily     multivit-min/ferrous fumarate (MULTI VITAMIN ORAL) Take by mouth     multivitamin with minerals tablet Take 1 tablet by mouth once daily     omeprazole  (PRILOSEC) 40 MG DR capsule Take 1 capsule by mouth once daily     predniSONE  (DELTASONE ) 20 MG tablet Take 20 mg by mouth 2 (two) times daily     traZODone (DESYREL) 100 MG tablet Take 1 tablet (100 mg total) by mouth at bedtime 30 tablet 1   traZODone (DESYREL) 100 MG tablet Take 1 tablet (100 mg total) by mouth at bedtime 7 tablet 0   traZODone (DESYREL) 50 MG tablet Take 50 mg by mouth at bedtime  UNABLE TO FIND Med Name: Amoxicillin  and K clavulanate 875-125 mg BID     venlafaxine (EFFEXOR-XR) 75 MG XR capsule Take 2 capsules (150 mg total) by mouth once daily 60 capsule 1   zolpidem tartrate (AMBIEN ORAL) Take by mouth     ARIPiprazole (ABILIFY) 2 MG tablet Take 1 tablet (2 mg total) by mouth at bedtime 30 tablet 2   No current facility-administered medications for this visit.    ALLERGIES Allergies  Allergen Reactions   Prednisone  Nausea And Vomiting and Vomiting    Increase heart rate   Methylprednisolone  Other (See Comments) and Palpitations    Increased heart rate  Increase heart rate  methylprednisolone    Oxycodone -Acetaminophen  Other (See Comments)  and Palpitations    Other reaction(s): increased heart rate  acetaminophen  / oxycodone      EXAM   There were no vitals filed for this visit.    There is no height or weight on file to calculate BMI.  MEMORY EVALUATION: 03/06/2024 - 29/30 07/20/2023 - 16/20  GENERAL: Pleasant female, mildly tearful, in nad. Normocephalic and atraumatic.   MUSCULOSKELETAL: Bulk - Normal Tone - Normal Pronator Drift - Absent bilaterally. Ambulation - Gait and station is normal. Ongoing bilateral knee pain when standing and walking, (had knee arthroplasty in 2019). Romberg - negative.  R/L 5/5    Shoulder abduction (deltoid/supraspinatus, axillary/suprascapular n, C5) 5/5    Elbow flexion (biceps brachii, musculoskeletal n, C5-6) 5/5    Elbow extension (triceps, radial n, C7) 5/5    Finger adduction (interossei, ulnar n, T1)  5/5    Hip flexion (iliopsoas, L1/L2) 5/5    Knee flexion (hamstrings, sciatic n, L5/S1)  5/5    Knee extension (quadriceps, femoral n, L3/4) 5/5    Ankle dorsiflexion (tibialis anterior, deep fibular n, L4/5) 5/5    Ankle plantarflexion (gastroc, tibial n, S1)   NEUROLOGICAL: MENTAL STATUS: Patient is oriented to person, place and time.   Short-term memory is mildly reduced. Long-term memory is intact.   Attention span and concentration are intact.   Naming and repetition are intact. Comprehension is intact.   Expressive speech is intact.   Patient's fund of knowledge is within normal limits for educational level. Able to follow 2 step command.  CRANIAL NERVES: Visual acuity and visual fields are intact         Extraocular muscles are intact                        Facial sensation is intact bilaterally                Facial strength is intact bilaterally                   Hearing is intact bilaterally                              Palate elevates midline, normal phonation     Shoulder shrug strength is intact                    Tongue protrudes midline                        SENSATION: Pain and temperature (spinothalamic tracts) is normal. Position and vibration (dorsal columns) is normal.  REFLEXES: R/L 2+/2+    Biceps 2+/2+    Brachioradialis  2+/2+    Patellar  2+/2+    Achilles  COORDINATION/CEREBELLAR: Finger to nose testing is intact.      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    Depression    Depression, major, recurrent, moderate (CMS-HCC) 02/02/2014   Last Assessment & Plan:  Stable; continue SSRI; call or seek help if any dark thoughts, any SI/HI   Diverticulosis 06/27/2015   Eosinophilic esophagitis 09/03/2017   Biopsy Dec 2018  Last Assessment & Plan:  Patient will start treatment for this soon   Essential hypertension 01/19/2018   External hemorrhoid 05/03/2015   External hemorrhoids 06/27/2015   Gastroesophagitis 02/02/2014   Last Assessment & Plan:  Keep f/u with GI   GERD (gastroesophageal reflux disease)    Headache disorder 02/17/2017   History of cyst of breast    History of uterine cancer    Hypertension    Hypothyroidism    Internal hemorrhoids 06/27/2015   Osteoarthritis    Other specified oesophagitis    stretch   Pain in joint, multiple sites 07/05/2017   Pharyngoesophageal dysphagia 03/24/2017   Sleep apnea    Sleep apnea, obstructive 02/02/2014   Status post bilateral breast biopsy 01/07/2016   Overview:  fibroadenoma   Tarsal tunnel syndrome     PAST SURGICAL HISTORY Past Surgical History:  Procedure Laterality Date   EGD  02/15/2014   Reflux Esophagitis - no repeat per Dr. Jeri   COLONOSCOPY  06/27/2015   Diverticulosis/Int & Ext hem/Otherwise normal/Repeat 66yrs/MGR   KNEE ARTHROSCOPY Right 07/22/2016   colonoscopy  2018   KNEE ARTHROSCOPY Left 11/10/2017   URETHRAL DIVERTICULECTOMY   11/29/2023   HYSTERECTOMY     with partial vaginectomy   medial meniscus tear Left    rectal surgery     TUBAL LIGATION      FAMILY HISTORY Family History  Problem Relation Name Age of Onset   Diabetes Mother     Breast cancer Maternal Aunt     Dementia Maternal Aunt     Breast cancer Paternal Aunt      SOCIAL HISTORY  Social History   Tobacco Use   Smoking status: Never   Smokeless tobacco: Never  Vaping Use   Vaping status: Never Used  Substance Use Topics   Alcohol use: No    Alcohol/week: 0.0 standard drinks of alcohol   Drug use: No     REVIEW OF SYSTEMS:  13 system ROS form was given to the patient to complete and I have reviewed it.  The form was sent for scan to the patient's EHR.  Pertinent positives and negatives are mentioned above in the HPI and all other systems are negative.   DATA  I have personally reviewed all of the data outlined below both prior to the appointment and during the appointment with the patient as appropriate.  No visits with results within 6 Month(s) from this visit.  Latest known visit with results is:  Office Visit on 05/09/2015  Component Date Value Ref Range Status   WBC (White Blood Cell Count) 05/09/2015 7.0  4.1 - 10.2 103/uL Final   RBC (Red Blood Cell Count) 05/09/2015 4.35  4.04 - 5.48 106/uL Final   Hemoglobin 05/09/2015 12.5  12.0 - 15.0 gm/dL Final   Hematocrit 91/74/7983 37.8  35.0 - 47.0 % Final   MCV (Mean Corpuscular Volume)  05/09/2015 86.9  80.0 - 100.0 fl Final   MCH (Mean Corpuscular Hemoglobin) 05/09/2015 28.7  27.0 - 31.2 pg Final   MCHC (Mean Corpuscular Hemoglobin * 05/09/2015 33.1  32.0 - 36.0 gm/dL Final   Platelet Count 05/09/2015 184  150 - 450 103/uL Final   RDW-CV (Red Cell Distribution Widt* 05/09/2015 12.9  11.6 - 14.8 % Final   MPV (Mean Platelet Volume) 05/09/2015 10.4 (H)  8.0 - 10.0 fl Final   Neutrophils 05/09/2015 3.70  1.50 - 7.80 103/uL Final   Lymphocytes  05/09/2015 2.53  1.00 - 3.60 103/uL Final   Monocytes 05/09/2015 0.41  0.00 - 1.50 103/uL Final   Eosinophils 05/09/2015 0.26  0.00 - 0.55 103/uL Final   Basophils 05/09/2015 0.06  0.00 - 0.09 103/uL Final   Neutrophil % 05/09/2015 53.1  32.0 - 70.0 % Final   Lymphocyte % 05/09/2015 36.3  10.0 - 50.0 % Final   Monocyte % 05/09/2015 5.9  4.0 - 13.0 % Final   Eosinophil % 05/09/2015 3.7  1.0 - 5.0 % Final   Basophil% 05/09/2015 0.9  0.0 - 2.0 % Final   Immature Granulocyte % 05/09/2015 0.1  <=0.7 % Final   Immature Granulocyte Count 05/09/2015 0.01  <=0.06 10^3/L Final      No follow-ups on file.  Payor: [facility] MEDICAID [place] / Plan: [place] [facility] / Product Type: Medicaid /    This note is partially prepared by Charmaine Moats, CMA in the presence of and acting as the scribe of Dr. Arthea Farrow, who has reviewed, edited and added to the note to reflect his best personal medical judgment.   This video encounter was conducted with the patient's (or proxy's) verbal consent via secure, interactive audio and video telecommunications while in clinic/office/hospital.  The patient (or proxy) was instructed to have this encounter in a suitably private space and to only have persons present to whom they give permission to participate. In addition, patient identity was confirmed by use of name plus an additional identifier.  This visit was coded based on medical decision making (MDM).  I have reviewed, edited and added to the note as needed to reflect my best personal medical judgment.    Dr. Arthea Farrow, MD [facility] [place], [place] Ph:  [phone] Fax:  [phone]

## 2024-08-24 DIAGNOSIS — M1712 Unilateral primary osteoarthritis, left knee: Secondary | ICD-10-CM | POA: Diagnosis not present

## 2024-08-24 DIAGNOSIS — Z96651 Presence of right artificial knee joint: Secondary | ICD-10-CM | POA: Diagnosis not present

## 2024-08-25 DIAGNOSIS — Z419 Encounter for procedure for purposes other than remedying health state, unspecified: Secondary | ICD-10-CM | POA: Diagnosis not present

## 2024-08-27 ENCOUNTER — Other Ambulatory Visit: Payer: Self-pay

## 2024-08-27 ENCOUNTER — Emergency Department
Admission: EM | Admit: 2024-08-27 | Discharge: 2024-08-27 | Disposition: A | Discharge status: Home / Self Care | Attending: Emergency Medicine | Admitting: Emergency Medicine

## 2024-08-27 DIAGNOSIS — R110 Nausea: Secondary | ICD-10-CM | POA: Diagnosis not present

## 2024-08-27 DIAGNOSIS — A084 Viral intestinal infection, unspecified: Secondary | ICD-10-CM | POA: Diagnosis not present

## 2024-08-27 DIAGNOSIS — R1111 Vomiting without nausea: Secondary | ICD-10-CM | POA: Diagnosis not present

## 2024-08-27 DIAGNOSIS — E039 Hypothyroidism, unspecified: Secondary | ICD-10-CM | POA: Insufficient documentation

## 2024-08-27 DIAGNOSIS — Z743 Need for continuous supervision: Secondary | ICD-10-CM | POA: Diagnosis not present

## 2024-08-27 DIAGNOSIS — I10 Essential (primary) hypertension: Secondary | ICD-10-CM | POA: Diagnosis not present

## 2024-08-27 LAB — RESP PANEL BY RT-PCR (RSV, FLU A&B, COVID)  RVPGX2
Influenza A by PCR: NEGATIVE
Influenza B by PCR: NEGATIVE
Resp Syncytial Virus by PCR: NEGATIVE
SARS Coronavirus 2 by RT PCR: NEGATIVE

## 2024-08-27 LAB — COMPREHENSIVE METABOLIC PANEL WITH GFR
ALT: 17 U/L (ref 0–44)
AST: 23 U/L (ref 15–41)
Albumin: 4.3 g/dL (ref 3.5–5.0)
Alkaline Phosphatase: 86 U/L (ref 38–126)
Anion gap: 11 (ref 5–15)
BUN: 17 mg/dL (ref 6–20)
CO2: 28 mmol/L (ref 22–32)
Calcium: 9.9 mg/dL (ref 8.9–10.3)
Chloride: 102 mmol/L (ref 98–111)
Creatinine, Ser: 0.78 mg/dL (ref 0.44–1.00)
GFR, Estimated: >60 mL/min (ref >60)
Glucose, Bld: 108 mg/dL — ABNORMAL HIGH (ref 70–99)
Potassium: 3.7 mmol/L (ref 3.5–5.1)
Sodium: 140 mmol/L (ref 135–145)
Total Bilirubin: 0.4 mg/dL (ref 0.0–1.2)
Total Protein: 7.4 g/dL (ref 6.5–8.1)

## 2024-08-27 LAB — CBC
HCT: 41.9 % (ref 36.0–46.0)
Hemoglobin: 13.4 g/dL (ref 12.0–15.0)
MCH: 27.8 pg (ref 26.0–34.0)
MCHC: 32 g/dL (ref 30.0–36.0)
MCV: 86.9 fL (ref 80.0–100.0)
Platelets: 252 K/uL (ref 150–400)
RBC: 4.82 MIL/uL (ref 3.87–5.11)
RDW: 13.1 % (ref 11.5–15.5)
WBC: 7.7 K/uL (ref 4.0–10.5)
nRBC: 0 % (ref 0.0–0.2)

## 2024-08-27 LAB — URINALYSIS, ROUTINE W REFLEX MICROSCOPIC
Bacteria, UA: NONE SEEN
Bilirubin Urine: NEGATIVE
Glucose, UA: NEGATIVE mg/dL
Hgb urine dipstick: NEGATIVE
Ketones, ur: 20 mg/dL — AB
Leukocytes,Ua: NEGATIVE
Nitrite: NEGATIVE
Protein, ur: 30 mg/dL — AB
RBC / HPF: 0 RBC/hpf (ref 0–5)
Specific Gravity, Urine: 1.025 (ref 1.005–1.030)
pH: 5 (ref 5.0–8.0)

## 2024-08-27 LAB — LIPASE, BLOOD: Lipase: 24 U/L (ref 11–51)

## 2024-08-27 MED ORDER — ONDANSETRON 4 MG PO TBDP
4.0000 mg | ORAL_TABLET | Freq: Once | ORAL | Status: AC
  Administered 2024-08-27: 4 mg via ORAL
  Filled 2024-08-27: qty 1

## 2024-08-27 MED ORDER — ONDANSETRON 4 MG PO TBDP: 4.0000 mg | ORAL_TABLET | Freq: Three times a day (TID) | ORAL | 0 refills | Status: DC | PRN

## 2024-08-27 MED ORDER — DICYCLOMINE HCL 10 MG PO CAPS: 10.0000 mg | ORAL_CAPSULE | Freq: Three times a day (TID) | ORAL | 0 refills | Status: DC | PRN

## 2024-08-27 MED ORDER — DICYCLOMINE HCL 10 MG PO CAPS
10.0000 mg | ORAL_CAPSULE | Freq: Once | ORAL | Status: AC
  Administered 2024-08-27: 10 mg via ORAL
  Filled 2024-08-27: qty 1

## 2024-08-27 NOTE — ED Notes (Signed)
 Spoke with Mr. Milissa at [facility], states he is sending someone for patient

## 2024-08-27 NOTE — Discharge Instructions (Addendum)
 You likely have a viral intestinal infection.  This should resolve on its own after a few days.  Eat a bland diet and drink plenty of fluids although you should take small sips at a time to prevent vomiting.  We have prescribed the same 2 medications you got in the ER, however you should only use them as needed for your symptoms.  You do not have to take them if you are feeling better.  Follow-up with your primary care provider.  Return to the ER for new, worsening, or persistent severe vomiting, abdominal pain, diarrhea, fever, weakness, or any other new or worsening symptoms that concern you.

## 2024-08-27 NOTE — ED Triage Notes (Signed)
 Pt comes with c/o generalized sickness, nausea and vomiting. Pt comes via EMS . Pt not able to keep food down. Pt has nonproductive cough.

## 2024-08-27 NOTE — ED Provider Notes (Signed)
 [facility] Provider Note    Event Date/Time   First MD Initiated Contact with Patient 08/27/24 1342     (approximate)   History   No chief complaint on file.   HPI  Kristina Reed is a 58 y.o. female with a history of hypertension, GERD, hypothyroidism, sleep apnea, and depression who presents with nausea and vomiting for the last several days, associated with diarrhea today as well as some epigastric abdominal pain.  She states she has had difficulty holding food or liquids down and will throw up when she tries.  She denies any fever but has had some chills.  She has no cough or shortness of breath.    Reviewed the past medical records.  The patient's most recent outpatient encounter was on 12/9 with Dr. Lane from neurology for evaluation of memory loss and pseudodementia.   Physical Exam   Triage Vital Signs: ED Triage Vitals  Encounter Vitals Group     BP 08/27/24 1142 131/81     Girls Systolic BP Percentile --      Girls Diastolic BP Percentile --      Boys Systolic BP Percentile --      Boys Diastolic BP Percentile --      Pulse Rate 08/27/24 1142 67     Resp 08/27/24 1142 16     Temp 08/27/24 1142 97.9 F (36.6 C)     Temp Source 08/27/24 1142 Oral     SpO2 08/27/24 1142 100 %     Weight 08/27/24 1140 157 lb 10.1 oz (71.5 kg)     Height --      Head Circumference --      Peak Flow --      Pain Score 08/27/24 1140 0     Pain Loc --      Pain Education --      Exclude from Growth Chart --     Most recent vital signs: Vitals:   08/27/24 1142 08/27/24 1559  BP: 131/81 138/81  Pulse: 67 65  Resp: 16 16  Temp: 97.9 F (36.6 C) 97.7 F (36.5 C)  SpO2: 100% 100%     General: Alert, well-appearing, no distress.  CV:  Good peripheral perfusion.  Resp:  Normal effort.  Abd:  Soft with no focal tenderness.  No distention.  Other:  Moist mucous membranes.  No jaundice or scleral icterus.   ED Results / Procedures / Treatments    Labs (all labs ordered are listed, but only abnormal results are displayed) Labs Reviewed  COMPREHENSIVE METABOLIC PANEL WITH GFR - Abnormal; Notable for the following components:      Result Value   Glucose, Bld 108 (*)    All other components within normal limits  URINALYSIS, ROUTINE W REFLEX MICROSCOPIC - Abnormal; Notable for the following components:   Color, Urine AMBER (*)    APPearance CLOUDY (*)    Ketones, ur 20 (*)    Protein, ur 30 (*)    All other components within normal limits  RESP PANEL BY RT-PCR (RSV, FLU A&B, COVID)  RVPGX2  LIPASE, BLOOD  CBC     EKG    RADIOLOGY    PROCEDURES:  Critical Care performed: No  Procedures   MEDICATIONS ORDERED IN ED: Medications  ondansetron  (ZOFRAN -ODT) disintegrating tablet 4 mg (4 mg Oral Given 08/27/24 1438)  dicyclomine  (BENTYL ) capsule 10 mg (10 mg Oral Given 08/27/24 1439)     IMPRESSION / MDM / ASSESSMENT AND  PLAN / ED COURSE  I reviewed the triage vital signs and the nursing notes.  58 year old female with PMH as noted above presents with nausea and vomiting for the last several days, now with some diarrhea and epigastric abdominal pain.  On exam the patient is very well-appearing.  Her vital signs are normal.  Abdomen soft with no focal tenderness.  She does not appear clinically dehydrated.  Labs are unremarkable.  CMP shows no acute findings.  CBC is normal.  Lipase is normal.  Differential diagnosis includes, but is not limited to, viral gastroenteritis, foodborne illness, COVID or other viral syndrome, gastritis, GERD.  We will obtain a respiratory panel, give Zofran  and Bentyl  for symptomatic treatment, and reassess.  There is no indication for IV fluids or for imaging at this time.  Patient's presentation is most consistent with acute complicated illness / injury requiring diagnostic workup.   ----------------------------------------- 4:27 PM on  08/27/2024 -----------------------------------------  Respiratory panel is negative.  Urinalysis is negative.  The patient is feeling better.  She is tolerating p.o.  She is stable for discharge at this time.  She is comfortable going home.  I counseled her on the results of the workup and plan of care.  I gave strict return precautions, and she expressed understanding.  FINAL CLINICAL IMPRESSION(S) / ED DIAGNOSES   Final diagnoses:  Viral gastroenteritis     Rx / DC Orders   ED Discharge Orders          Ordered    dicyclomine  (BENTYL ) 10 MG capsule  Every 8 hours PRN        08/27/24 1627    ondansetron  (ZOFRAN -ODT) 4 MG disintegrating tablet  Every 8 hours PRN        08/27/24 1627             Note:  This document was prepared using Dragon voice recognition software and may include unintentional dictation errors.    Jacolyn Pae, MD 08/27/24 1650

## 2024-08-27 NOTE — ED Notes (Signed)
 PO challenge started

## 2024-08-27 NOTE — ED Notes (Signed)
 No N/V reported

## 2024-08-27 NOTE — ED Notes (Signed)
 Patient states she can't void at this time. Patient states she has had a poor appetite lately.

## 2024-08-30 ENCOUNTER — Ambulatory Visit: Admitting: Family Medicine

## 2024-08-30 ENCOUNTER — Encounter: Payer: Self-pay | Admitting: Family Medicine

## 2024-08-30 VITALS — BP 128/76 | HR 65 | Resp 16 | Ht 61.0 in | Wt 150.0 lb

## 2024-08-30 DIAGNOSIS — R109 Unspecified abdominal pain: Secondary | ICD-10-CM

## 2024-08-30 DIAGNOSIS — K219 Gastro-esophageal reflux disease without esophagitis: Secondary | ICD-10-CM

## 2024-08-30 DIAGNOSIS — Z7689 Persons encountering health services in other specified circumstances: Secondary | ICD-10-CM | POA: Diagnosis not present

## 2024-08-30 NOTE — Patient Instructions (Signed)
 Bland Diet A bland diet may consist of soft foods or foods that are not high in fat or are not greasy, acidic, or spicy. Avoiding certain foods may cause less irritation to your mouth, throat, stomach, or gastrointestinal tract. Avoiding certain foods may make you feel better. Everyone's tolerances are different. A bland diet should be based on what you can tolerate and what may cause discomfort. What is my plan? Your health care provider or dietitian may recommend specific changes to your diet to treat your symptoms. These changes may include: Eating small meals frequently. Cooking food until it is soft enough to chew easily. Taking the time to chew your food thoroughly, so it is easy to swallow and digest. Avoiding foods that cause you discomfort. These may include spicy food, fried food, greasy foods, hard-to-chew foods, or citrus fruits and juices. Drinking slowly. What are tips for following this plan? Reading food labels To reduce fiber intake, look for food labels that say whole, such as whole wheat or whole grain. Shopping Avoid food items that may have nuts or seeds. Avoid vegetables that may make you gassy or have a tough texture, such as broccoli, cauliflower, or corn. Cooking Cook foods thoroughly so they have a soft texture. Meal planning Make sure you include foods from all food groups to eat a balanced diet. Eat a variety of types of foods. Eat foods and drink beverages that do not cause you discomfort. These may include soups and broths with cooked meats, pasta, and vegetables. Lifestyle Sit up after meals, avoid tight clothing, and take time to eat and chew your food slowly. Ask your health care provider whether you should take dietary supplements. General information Mildly season your foods. Some seasonings, such as cayenne pepper, vinegar, or hot sauce, may cause irritation. The foods, beverages, or seasonings to avoid should be based on individual tolerance. What  foods should I eat? Fruits Canned or cooked fruit such as peaches, pears, or applesauce. Bananas. Vegetables Well-cooked vegetables. Canned or cooked vegetables such as carrots, green beans, beets, or spinach. Mashed or boiled potatoes. Grains  Hot cereals, such as cream of wheat and processed oatmeal. Rice. Bread, crackers, pasta, or tortillas made from refined white flour. Meats and other proteins  Eggs. Creamy peanut butter or other nut butters. Lean, well-cooked tender meats, such as beef, pork, chicken, or fish. Dairy Low-fat dairy products such as milk, cottage cheese, or yogurt. Beverages  Water . Herbal tea. Apple juice. Fats and oils Mild salad dressings. Canola or olive oil. Sweets and desserts Low-fat pudding, custard, or ice cream. Fruit gelatin. The items listed above may not be a complete list of foods and beverages you can eat. Contact a dietitian for more information. What foods should I avoid? Fruits Citrus fruits, such as oranges and grapefruit. Fruits with a stringy texture. Fruits that have lots of seeds, such as kiwi or strawberries. Dried fruits. Vegetables Raw, uncooked vegetables. Salads. Grains Whole grain breads, muffins, and cereals. Meats and other proteins Tough, fibrous meats. Highly seasoned meat such as corned beef, smoked meats, or fish. Processed high-fat meats such as brats, hot dogs, or sausage. Dairy Full-fat dairy foods such as ice cream and cheese. Beverages Caffeinated drinks. Alcohol. Seasonings and condiments Strongly flavored seasonings or condiments. Hot sauce. Salsa. Other foods Spicy foods. Fried or greasy foods. Sour foods, such as pickled or fermented foods like sauerkraut. Foods high in fiber. The items listed above may not be a complete list of foods and beverages you should  avoid. Contact a dietitian for more information. Summary A bland diet should be based on individual tolerance. It may consist of foods that are soft  textured and do not have a lot of fat, fiber, acid, or seasonings. A bland diet may be recommended because avoiding certain foods, beverages, or spices may make you feel better. This information is not intended to replace advice given to you by your health care provider. Make sure you discuss any questions you have with your health care provider.

## 2024-08-30 NOTE — Progress Notes (Signed)
 Established Patient Office Visit  Subjective   Patient ID: Kristina Reed, female    DOB: Feb 05, 1966  Age: 58 y.o. MRN: [id number]  Chief Complaint  Patient presents with   ER Follow-Up    Having low appetite since ER    HPI Patient is a 58 year old female who presents today for ER follow up. She is a new patient to myself, however well established in office following with Dr. Bernardo. She most recently presented to [facility] due to nausea and vomiting for several days as well as with complaints of diarrhea and abdominal pain. She was seen at the ED on 08/27/24. Testing completed included COVID and flu testing which were both negative, UA without obvious concern for UTI, CBC unremarkable, CMP unremarkable, and lipase negative. She was discharged with prescriptions for Dicyclomine  and Zofran . She is today for follow up as noted, and endorses concern for poor appetite since ED discharge on 08/27/24. She denies nausea or vomiting. She reports abdominal pain but states this intermittent and episodes are typically lasting 1 to 2 minutes. Denies diarrhea or constipation; she reports last bowel movement yesterday and states stool was solid. She voices other than poor appetite she is feeling better, and voices she definitely feels better than Sunday. She states she has been eating small meals but has been able to maintain adequate hydration and fluid intake.   She continues to take Omeprazole  as needed for GERD and voices symptoms are well controlled.     Review of Systems  Constitutional:  Negative for chills and fever.  Gastrointestinal:  Positive for abdominal pain. Negative for blood in stool, constipation, diarrhea, heartburn, nausea and vomiting.  Genitourinary:  Negative for dysuria, flank pain, frequency and urgency.      Objective:     BP 128/76   Pulse 65   Resp 16   Ht 5' 1 (1.549 m)   Wt 150 lb (68 kg)   LMP 05/09/1993   SpO2 100%   BMI 28.34 kg/m     Physical Exam Constitutional:      General: She is not in acute distress.    Appearance: Normal appearance. She is not ill-appearing or toxic-appearing.  HENT:     Head: Normocephalic.  Cardiovascular:     Rate and Rhythm: Normal rate and regular rhythm.     Heart sounds: Normal heart sounds.  Pulmonary:     Effort: Pulmonary effort is normal.     Breath sounds: Normal breath sounds.  Abdominal:     General: Abdomen is flat. Bowel sounds are normal. There is no distension.     Palpations: Abdomen is soft.     Tenderness: There is abdominal tenderness.  Skin:    General: Skin is warm and dry.  Neurological:     General: No focal deficit present.     Mental Status: She is alert.  Psychiatric:        Mood and Affect: Mood normal.        Behavior: Behavior normal.       Last CBC Lab Results  Component Value Date   WBC 7.7 08/27/2024   HGB 13.4 08/27/2024   HCT 41.9 08/27/2024   MCV 86.9 08/27/2024   MCH 27.8 08/27/2024   RDW 13.1 08/27/2024   PLT 252 08/27/2024   Last metabolic panel Lab Results  Component Value Date   GLUCOSE 108 (H) 08/27/2024   NA 140 08/27/2024   K 3.7 08/27/2024   CL 102  08/27/2024   CO2 28 08/27/2024   BUN 17 08/27/2024   CREATININE 0.78 08/27/2024   GFRNONAA >60 08/27/2024   CALCIUM 9.9 08/27/2024   PROT 7.4 08/27/2024   ALBUMIN 4.3 08/27/2024   LABGLOB 2.9 01/13/2016   AGRATIO 1.6 01/13/2016   BILITOT 0.4 08/27/2024   ALKPHOS 86 08/27/2024   AST 23 08/27/2024   ALT 17 08/27/2024   ANIONGAP 11 08/27/2024          Assessment & Plan:   Assessment & Plan Abdominal pain, unspecified abdominal location Patient is a 58 year old female who is seen today for ED follow up. She had been seen at [facility] ER on 08/27/24 due to complaints of nausea, vomiting, diarrhea and abdominal pain. She reports nausea, vomiting and diarrhea have resolved. She endorses poor appetite, only eating small meals. She also endorses intermittent  abdominal pain typically lasting 1 to 2 minutes.  ED workup overall unremarkable. CBC and CMP unremarkable. Lipase normal. Rapid COVID and flu testing negative.  Today's physical exam pertinent findings include LLQ abdominal pain with palpation. Bowel sounds are present and normal. LUQ, RUQ and RLQ non tender to palpation.   -Recommended that she try to maintain a bland diet until symptoms improve. Continue with increased fluid intake. Written education provided regarding bland diet. -Return precautions advised.  -Maintain scheduled follow up with PCP.    Gastroesophageal reflux disease, unspecified whether esophagitis present GERD symptoms well controlled. Denies heartburn, indigestion or other acid reflux. She does take Omeprazole  as prescribed by her PCP, and states this does well controlling GERD.         Return if symptoms worsen or fail to improve.    LAYMON LOISE CORE, FNP

## 2024-09-01 DIAGNOSIS — M25562 Pain in left knee: Secondary | ICD-10-CM | POA: Diagnosis not present

## 2024-10-05 ENCOUNTER — Ambulatory Visit: Attending: Internal Medicine | Admitting: Internal Medicine

## 2024-10-05 ENCOUNTER — Encounter: Payer: Self-pay | Admitting: Internal Medicine

## 2024-10-05 VITALS — BP 140/88 | HR 80 | Ht 61.5 in | Wt 157.8 lb

## 2024-10-05 DIAGNOSIS — R0609 Other forms of dyspnea: Secondary | ICD-10-CM | POA: Diagnosis present

## 2024-10-05 DIAGNOSIS — I491 Atrial premature depolarization: Secondary | ICD-10-CM | POA: Diagnosis not present

## 2024-10-05 DIAGNOSIS — I10 Essential (primary) hypertension: Secondary | ICD-10-CM | POA: Diagnosis present

## 2024-10-05 DIAGNOSIS — R002 Palpitations: Secondary | ICD-10-CM | POA: Insufficient documentation

## 2024-10-05 MED ORDER — METOPROLOL SUCCINATE ER 25 MG PO TB24: 25.0000 mg | ORAL_TABLET | Freq: Every day | ORAL | 3 refills | Status: AC

## 2024-10-05 MED ORDER — LISINOPRIL 5 MG PO TABS: 5.0000 mg | ORAL_TABLET | Freq: Every day | ORAL | 3 refills | Status: AC

## 2024-10-05 NOTE — Progress Notes (Signed)
 " Cardiology Office Note:  .   Date:  10/07/2024  ID:  Kristina Reed, DOB 20-Nov-1965, MRN [id number] PCP: Bernardo Fend, DO  [facility] Providers Cardiologist:  Lonni Hanson, MD     History of Present Illness: .   Kristina Reed is a 59 y.o. female with history of hypertension, hypothyroidism, eosinophilic esophagitis, and cervical cancer, who presents for follow-up of palpitations and shortness of breath.  She was last seen in our office in 06/2023 by Medford Meager, NP, at which time it was felt that her chronic exertional dyspnea was thought to be due to deconditioning.  She noted that her palpitations had resolved with addition of beta-blocker therapy.  No medication changes or additional testing were pursued.  Today, Kristina Reed reports that she has been doing okay.  She is scheduled for cataract surgery next week and notes that her left eye has been somewhat irritated.  She denies chest pain.  She has stable exertional dyspnea when doing activities around the house, which is accompanied by elevations in her heart rate.  She otherwise does not have palpitations.  She denies lower extremity edema and lightheadedness/syncope.  She notes that she has been off several of her medications; she ran out of lisinopril  about a week ago and has not been able to get this refilled.  Metoprolol  also fell off her medication list for unclear reasons.  As far as she recalls, she was tolerating the medication well.  ROS: See HPI  Studies Reviewed: SABRA   EKG Interpretation Date/Time:  Thursday October 05 2024 09:17:24 EST Ventricular Rate:  75 PR Interval:  160 QRS Duration:  86 QT Interval:  372 QTC Calculation: 415 R Axis:   -22  Text Interpretation: Normal sinus rhythm Minimal voltage criteria for LVH, may be normal variant ( R in aVL ) Borderline ECG When compared with ECG of 29-Jun-2023 09:18, No significant change was found Confirmed by Lorane Cousar [phone]) on 10/05/2024 9:25:42 AM    EP  Procedures and Devices: 14-day event monitor (03/08/2023): Predominantly sinus rhythm with rare PACs and PVCs.  No significant arrhythmia identified.   Non-Invasive Evaluation(s): TTE (02/24/2023): Normal LV size and wall thickness.  LVEF 60-65% with normal wall motion and diastolic function.  Normal RV size and function.  Normal biatrial size.  No significant valvular abnormalities.  Normal CVP. Coronary CTA (02/15/2023): Normal coronary arteries without stenosis or calcification.  No significant extracardiac findings in the visualized chest. TTE (01/31/2019): Normal LV size and wall thickness.  LVEF 55-6% with normal wall motion and diastolic function.  Normal RV size and function.  Normal biatrial size.  No significant valvular abnormalities.  Risk Assessment/Calculations:           Physical Exam:   VS:  BP (!) 140/88   Pulse 80   Ht 5' 1.5 (1.562 m)   Wt 157 lb 12.8 oz (71.6 kg)   LMP 05/09/1993   SpO2 98%   BMI 29.33 kg/m    Wt Readings from Last 3 Encounters:  10/05/24 157 lb 12.8 oz (71.6 kg)  08/30/24 150 lb (68 kg)  08/27/24 157 lb 10.1 oz (71.5 kg)    General:  NAD. Neck: No JVD or HJR. Lungs: Clear to auscultation bilaterally without wheezes or crackles. Heart: Regular rate and rhythm without murmurs, rubs, or gallops. Abdomen: Soft, nontender, nondistended. Extremities: No lower extremity edema.  ASSESSMENT AND PLAN: .    Dyspnea on exertion: This has been a chronic problem for [name] and is stable.  Prior cardiac workup including coronary CTA and echocardiogram were unrevealing.  I do not think that her exertional dyspnea is primarily cardiac in nature.  An element of deconditioning is likely playing a role, as previously noted.  Uncontrolled blood pressure in the setting of inconsistent medication use could be playing a role as well.  Will resume lisinopril  and metoprolol .  No further cardiac workup recommended at this time.  Palpitations: Kristina Reed notes that her  heart rate speeds up when she is active but otherwise has not had any frank palpitations.  Prior event monitor showed rare PACs and PVCs but no significant arrhythmia.  She was previously on metoprolol , though this fell off her medication list.  We will resume metoprolol  succinate 25 mg daily.  Hypertension: Blood pressure mildly elevated today, likely exacerbated by the fact that she has been off metoprolol  and lisinopril .  We will resume previous doses of both medications and also plan to repeat a BMP in 1 week to ensure stable renal function and electrolytes.  Hypothyroidism: Continue levothyroxine  with ongoing management per her PCP.  TSH appropriate on last check in 05/2024.    Dispo: Return to clinic in 3 months.  Signed, Lonni Hanson, MD  "

## 2024-10-05 NOTE — Patient Instructions (Signed)
 Medication Instructions:  Your physician recommends the following medication changes.  START TAKING: Metoprolol  25 mg daily   *If you need a refill on your cardiac medications before your next appointment, please call your pharmacy*  Lab Work: No labs ordered today  If you have labs (blood work) drawn today and your tests are completely normal, you will receive your results only by: MyChart Message (if you have MyChart) OR A paper copy in the mail If you have any lab test that is abnormal or we need to change your treatment, we will call you to review the results.  Testing/Procedures: No test ordered today   Follow-Up: At [facility], you and your health needs are our priority.  As part of our continuing mission to provide you with exceptional heart care, our providers are all part of one team.  This team includes your primary Cardiologist (physician) and Advanced Practice Providers or APPs (Physician Assistants and Nurse Practitioners) who all work together to provide you with the care you need, when you need it.  Your next appointment:   3 month(s)  Provider:   You may see Lonni Hanson, MD or one of the following Advanced Practice Providers on your designated Care Team:   Lonni Meager, NP Lesley Maffucci, PA-C Bernardino Bring, PA-C Cadence Atlantis, PA-C Tylene Lunch, NP Barnie Hila, NP    We recommend signing up for the patient portal called MyChart.  Sign up information is provided on this After Visit Summary.  MyChart is used to connect with patients for Virtual Visits (Telemedicine).  Patients are able to view lab/test results, encounter notes, upcoming appointments, etc.  Non-urgent messages can be sent to your provider as well.   To learn more about what you can do with MyChart, go to [email or web address].   Other Instructions

## 2024-10-07 ENCOUNTER — Encounter: Payer: Self-pay | Admitting: Internal Medicine

## 2024-10-11 ENCOUNTER — Ambulatory Visit: Admitting: Obstetrics and Gynecology

## 2024-12-11 ENCOUNTER — Ambulatory Visit: Admitting: Internal Medicine

## 2025-01-03 ENCOUNTER — Ambulatory Visit: Admitting: Medical
